# Patient Record
Sex: Female | Born: 1945 | Race: White | Hispanic: No | Marital: Married | Smoking: Current every day smoker
Health system: Southern US, Community
[De-identification: ages and names within clinical notes are randomized; demographics above are authoritative.]

## PROBLEM LIST (undated history)

## (undated) DIAGNOSIS — M1990 Unspecified osteoarthritis, unspecified site: Secondary | ICD-10-CM

## (undated) DIAGNOSIS — Z803 Family history of malignant neoplasm of breast: Secondary | ICD-10-CM

## (undated) DIAGNOSIS — F329 Major depressive disorder, single episode, unspecified: Secondary | ICD-10-CM

## (undated) DIAGNOSIS — G2401 Drug induced subacute dyskinesia: Secondary | ICD-10-CM

## (undated) DIAGNOSIS — S12000A Unspecified displaced fracture of first cervical vertebra, initial encounter for closed fracture: Secondary | ICD-10-CM

## (undated) DIAGNOSIS — F32A Depression, unspecified: Secondary | ICD-10-CM

## (undated) DIAGNOSIS — F419 Anxiety disorder, unspecified: Secondary | ICD-10-CM

## (undated) DIAGNOSIS — Z8042 Family history of malignant neoplasm of prostate: Secondary | ICD-10-CM

## (undated) DIAGNOSIS — K219 Gastro-esophageal reflux disease without esophagitis: Secondary | ICD-10-CM

## (undated) DIAGNOSIS — T7840XA Allergy, unspecified, initial encounter: Secondary | ICD-10-CM

## (undated) DIAGNOSIS — F1010 Alcohol abuse, uncomplicated: Secondary | ICD-10-CM

## (undated) DIAGNOSIS — D649 Anemia, unspecified: Secondary | ICD-10-CM

## (undated) DIAGNOSIS — C50919 Malignant neoplasm of unspecified site of unspecified female breast: Secondary | ICD-10-CM

## (undated) DIAGNOSIS — C801 Malignant (primary) neoplasm, unspecified: Secondary | ICD-10-CM

## (undated) DIAGNOSIS — I10 Essential (primary) hypertension: Secondary | ICD-10-CM

## (undated) HISTORY — PX: LAPAROSCOPY: SHX197

## (undated) HISTORY — PX: ROTATOR CUFF REPAIR: SHX139

## (undated) HISTORY — PX: ABDOMINAL HYSTERECTOMY: SHX81

## (undated) HISTORY — PX: OTHER SURGICAL HISTORY: SHX169

## (undated) HISTORY — PX: KNEE SURGERY: SHX244

## (undated) HISTORY — PX: INCONTINENCE SURGERY: SHX676

## (undated) HISTORY — DX: Family history of malignant neoplasm of breast: Z80.3

## (undated) HISTORY — DX: Essential (primary) hypertension: I10

## (undated) HISTORY — DX: Malignant (primary) neoplasm, unspecified: C80.1

## (undated) HISTORY — DX: Allergy, unspecified, initial encounter: T78.40XA

## (undated) HISTORY — PX: AUGMENTATION MAMMAPLASTY: SUR837

## (undated) HISTORY — DX: Anxiety disorder, unspecified: F41.9

## (undated) HISTORY — DX: Gastro-esophageal reflux disease without esophagitis: K21.9

## (undated) HISTORY — DX: Major depressive disorder, single episode, unspecified: F32.9

## (undated) HISTORY — DX: Unspecified osteoarthritis, unspecified site: M19.90

## (undated) HISTORY — PX: CHOLECYSTECTOMY: SHX55

## (undated) HISTORY — DX: Family history of malignant neoplasm of prostate: Z80.42

## (undated) HISTORY — DX: Depression, unspecified: F32.A

---

## 1997-02-21 DIAGNOSIS — S12000A Unspecified displaced fracture of first cervical vertebra, initial encounter for closed fracture: Secondary | ICD-10-CM

## 1997-02-21 HISTORY — DX: Unspecified displaced fracture of first cervical vertebra, initial encounter for closed fracture: S12.000A

## 1997-11-05 ENCOUNTER — Emergency Department (HOSPITAL_COMMUNITY): Admission: EM | Admit: 1997-11-05 | Discharge: 1997-11-05 | Payer: Self-pay

## 1998-05-13 ENCOUNTER — Encounter: Payer: Self-pay | Admitting: Orthopaedic Surgery

## 1998-05-15 ENCOUNTER — Inpatient Hospital Stay (HOSPITAL_COMMUNITY): Admission: RE | Admit: 1998-05-15 | Discharge: 1998-05-16 | Payer: Self-pay | Admitting: Orthopaedic Surgery

## 1999-02-22 ENCOUNTER — Other Ambulatory Visit: Admission: RE | Admit: 1999-02-22 | Discharge: 1999-02-22 | Payer: Self-pay | Admitting: Obstetrics & Gynecology

## 2000-02-29 ENCOUNTER — Other Ambulatory Visit: Admission: RE | Admit: 2000-02-29 | Discharge: 2000-02-29 | Payer: Self-pay | Admitting: Obstetrics & Gynecology

## 2000-09-27 ENCOUNTER — Encounter: Payer: Self-pay | Admitting: Family Medicine

## 2000-09-27 ENCOUNTER — Encounter: Admission: RE | Admit: 2000-09-27 | Discharge: 2000-09-27 | Payer: Self-pay | Admitting: Family Medicine

## 2001-03-20 ENCOUNTER — Other Ambulatory Visit: Admission: RE | Admit: 2001-03-20 | Discharge: 2001-03-20 | Payer: Self-pay | Admitting: Obstetrics & Gynecology

## 2001-07-31 ENCOUNTER — Encounter: Admission: RE | Admit: 2001-07-31 | Discharge: 2001-07-31 | Payer: Self-pay | Admitting: Neurosurgery

## 2001-07-31 ENCOUNTER — Encounter: Payer: Self-pay | Admitting: Neurosurgery

## 2002-05-09 ENCOUNTER — Other Ambulatory Visit: Admission: RE | Admit: 2002-05-09 | Discharge: 2002-05-09 | Payer: Self-pay | Admitting: Obstetrics & Gynecology

## 2003-09-10 ENCOUNTER — Other Ambulatory Visit: Admission: RE | Admit: 2003-09-10 | Discharge: 2003-09-10 | Payer: Self-pay | Admitting: Obstetrics & Gynecology

## 2004-10-18 ENCOUNTER — Other Ambulatory Visit: Admission: RE | Admit: 2004-10-18 | Discharge: 2004-10-18 | Payer: Self-pay | Admitting: Obstetrics & Gynecology

## 2006-08-17 ENCOUNTER — Encounter: Admission: RE | Admit: 2006-08-17 | Discharge: 2006-08-17 | Payer: Self-pay | Admitting: Family Medicine

## 2006-12-31 ENCOUNTER — Emergency Department (HOSPITAL_COMMUNITY): Admission: EM | Admit: 2006-12-31 | Discharge: 2006-12-31 | Payer: Self-pay | Admitting: Emergency Medicine

## 2007-01-24 ENCOUNTER — Encounter: Admission: RE | Admit: 2007-01-24 | Discharge: 2007-01-24 | Payer: Self-pay | Admitting: Family Medicine

## 2007-09-11 ENCOUNTER — Encounter: Admission: RE | Admit: 2007-09-11 | Discharge: 2007-09-11 | Payer: Self-pay | Admitting: Family Medicine

## 2008-03-25 ENCOUNTER — Ambulatory Visit (HOSPITAL_BASED_OUTPATIENT_CLINIC_OR_DEPARTMENT_OTHER): Admission: RE | Admit: 2008-03-25 | Discharge: 2008-03-25 | Payer: Self-pay | Admitting: Urology

## 2008-11-28 ENCOUNTER — Encounter: Admission: RE | Admit: 2008-11-28 | Discharge: 2008-11-28 | Payer: Self-pay | Admitting: Family Medicine

## 2010-02-17 ENCOUNTER — Encounter
Admission: RE | Admit: 2010-02-17 | Discharge: 2010-02-17 | Payer: Self-pay | Source: Home / Self Care | Attending: Obstetrics & Gynecology | Admitting: Obstetrics & Gynecology

## 2010-06-08 LAB — CBC
Hemoglobin: 12.9 g/dL (ref 12.0–15.0)
Platelets: 349 10*3/uL (ref 150–400)
RDW: 13.6 % (ref 11.5–15.5)
WBC: 6 10*3/uL (ref 4.0–10.5)

## 2010-06-08 LAB — BASIC METABOLIC PANEL
Chloride: 96 mEq/L (ref 96–112)
GFR calc Af Amer: >60 mL/min (ref >60)
GFR calc non Af Amer: >60 mL/min (ref >60)
Potassium: 4.1 mEq/L (ref 3.5–5.1)
Sodium: 129 mEq/L — ABNORMAL LOW (ref 135–145)

## 2010-06-08 LAB — TYPE AND SCREEN

## 2010-07-06 NOTE — Op Note (Signed)
NAMECLOTINE, Mindy Young                 ACCOUNT NO.:  [id number]   MEDICAL RECORD NO.:  [id number]          PATIENT TYPE:  AMB   LOCATION:  [facility]                         FACILITY:  [facility]   PHYSICIAN:  Martina Sinner, MD DATE OF BIRTH:  1945/08/29   DATE OF PROCEDURE:  DATE OF DISCHARGE:                               OPERATIVE REPORT   PREOPERATIVE DIAGNOSIS:  Stress incontinence.   POSTOPERATIVE DIAGNOSIS:  Stress incontinence.   SURGERY:  Sling [facility]),  cystourethropexy plus cystoscopy.   SURGEON:  Scott A. MacDiarmid, MD.   Mindy Young has stress incontinence.  She was prepped and draped in usual  fashion.  Her preoperative lab tests were normal.  Preoperative  antibiotics were given.  Extra care was taken with leg positioning to  minimize the risks   Two 1 cm incisions were made one fingerbreadth above the symphysis pubis  and 1.5 cm lateral to the midline.  I marked a 2 cm incision underlying  the mid urethra.  I instilled 7 mL of lidocaine and epinephrine mixture.  I made an appropriately deep incision and dissected sharply to  urethrovesical angle bilaterally.   With the bladder empty I passed the [facility] needle on top of and along the  back of the symphysis pubis keeping lateral and then turning into the  pulp with my index finger bilaterally.  With the bladder empty I  attached the sling and brought it up through the retropubic space.  Before attaching the sling I cystoscoped the patient.  There was no  indentation on the bladder with movement of the trocar.  There was  excellent fluid jets  bilaterally.  There is no injury to the bladder or  urethra.   I tensioned over the fat part with moderate-sized Kelly clamp.  I cut  below the blue dots, irrigated the sheath and removed the sheaths.  I  was very happy with the position of the sling and its hypermobility and  lack of spring back effect.   I closed the anterior vaginal wall with a running 2-0 Vicryl, followed  by  two interrupted sutures.  I cut the sling below the skin in the  suprapubic area, used a 4-0 Vicryl suture and then Dermabond.   Under anesthesia, Mindy Young grade 2 cystocele was a little bit larger  and hopefully will not be symptomatic as she ages.  I am hoping the  sling reaches her treatment goal.  I did keep the sling away from the  proximal urethra to minimize any potential future hinge effect.           ______________________________  Martina Sinner, MD  Electronically Signed    SAM/MEDQ  D:  03/25/2008  T:  03/26/2008  Job:  [id number]

## 2010-07-27 ENCOUNTER — Ambulatory Visit
Admission: RE | Admit: 2010-07-27 | Discharge: 2010-07-27 | Disposition: A | Discharge status: Home / Self Care | Payer: BC Managed Care – PPO | Source: Ambulatory Visit | Attending: Family Medicine | Admitting: Family Medicine

## 2010-07-27 ENCOUNTER — Other Ambulatory Visit: Payer: Self-pay | Admitting: Family Medicine

## 2010-07-27 MED ORDER — IOHEXOL 300 MG/ML  SOLN
100.0000 mL | Freq: Once | INTRAMUSCULAR | Status: AC | PRN
  Administered 2010-07-27: 100 mL via INTRAVENOUS

## 2010-08-23 ENCOUNTER — Encounter: Payer: Self-pay | Admitting: Internal Medicine

## 2010-09-07 ENCOUNTER — Ambulatory Visit (AMBULATORY_SURGERY_CENTER): Payer: BC Managed Care – PPO | Admitting: *Deleted

## 2010-09-07 DIAGNOSIS — Z1211 Encounter for screening for malignant neoplasm of colon: Secondary | ICD-10-CM

## 2010-09-07 DIAGNOSIS — K5732 Diverticulitis of large intestine without perforation or abscess without bleeding: Secondary | ICD-10-CM

## 2010-09-07 DIAGNOSIS — K5792 Diverticulitis of intestine, part unspecified, without perforation or abscess without bleeding: Secondary | ICD-10-CM

## 2010-09-07 MED ORDER — PEG-KCL-NACL-NASULF-NA ASC-C 100 G PO SOLR: 1.0000 | Freq: Once | ORAL | Status: DC

## 2010-09-17 ENCOUNTER — Telehealth: Payer: Self-pay | Admitting: *Deleted

## 2010-09-17 NOTE — Telephone Encounter (Signed)
Patient is concerned about taking her prep and keeping it down at the early AM time. Discussed with patient and offered to move her appointment to 11:30 AM but patient decided to keep her appointment at the scheduled time.

## 2010-09-21 ENCOUNTER — Encounter: Payer: Self-pay | Admitting: Internal Medicine

## 2010-09-21 ENCOUNTER — Ambulatory Visit (AMBULATORY_SURGERY_CENTER): Payer: BC Managed Care – PPO | Admitting: Internal Medicine

## 2010-09-21 DIAGNOSIS — D126 Benign neoplasm of colon, unspecified: Secondary | ICD-10-CM

## 2010-09-21 DIAGNOSIS — K5732 Diverticulitis of large intestine without perforation or abscess without bleeding: Secondary | ICD-10-CM

## 2010-09-21 DIAGNOSIS — Z1211 Encounter for screening for malignant neoplasm of colon: Secondary | ICD-10-CM

## 2010-09-21 MED ORDER — SODIUM CHLORIDE 0.9 % IV SOLN: 500.0000 mL | INTRAVENOUS | Status: DC

## 2010-09-21 NOTE — Patient Instructions (Signed)
Discharge instructions given with verbal understanding. Handout on polyps given. Resume previous medications. 

## 2010-09-22 ENCOUNTER — Telehealth: Payer: Self-pay

## 2010-09-22 NOTE — Telephone Encounter (Signed)

## 2010-09-27 ENCOUNTER — Encounter: Payer: Self-pay | Admitting: Internal Medicine

## 2010-12-10 ENCOUNTER — Emergency Department (HOSPITAL_BASED_OUTPATIENT_CLINIC_OR_DEPARTMENT_OTHER)
Admission: EM | Admit: 2010-12-10 | Discharge: 2010-12-10 | Disposition: A | Discharge status: Home / Self Care | Payer: BC Managed Care – PPO | Attending: Emergency Medicine | Admitting: Emergency Medicine

## 2010-12-10 ENCOUNTER — Emergency Department (HOSPITAL_BASED_OUTPATIENT_CLINIC_OR_DEPARTMENT_OTHER): Payer: BC Managed Care – PPO

## 2010-12-10 ENCOUNTER — Encounter (HOSPITAL_BASED_OUTPATIENT_CLINIC_OR_DEPARTMENT_OTHER): Payer: Self-pay

## 2010-12-10 ENCOUNTER — Emergency Department (INDEPENDENT_AMBULATORY_CARE_PROVIDER_SITE_OTHER): Payer: BC Managed Care – PPO

## 2010-12-10 DIAGNOSIS — IMO0002 Reserved for concepts with insufficient information to code with codable children: Secondary | ICD-10-CM

## 2010-12-10 DIAGNOSIS — S51009A Unspecified open wound of unspecified elbow, initial encounter: Secondary | ICD-10-CM

## 2010-12-10 DIAGNOSIS — I10 Essential (primary) hypertension: Secondary | ICD-10-CM | POA: Insufficient documentation

## 2010-12-10 DIAGNOSIS — W010XXA Fall on same level from slipping, tripping and stumbling without subsequent striking against object, initial encounter: Secondary | ICD-10-CM | POA: Insufficient documentation

## 2010-12-10 DIAGNOSIS — W19XXXA Unspecified fall, initial encounter: Secondary | ICD-10-CM

## 2010-12-10 DIAGNOSIS — F329 Major depressive disorder, single episode, unspecified: Secondary | ICD-10-CM | POA: Insufficient documentation

## 2010-12-10 DIAGNOSIS — F17200 Nicotine dependence, unspecified, uncomplicated: Secondary | ICD-10-CM | POA: Insufficient documentation

## 2010-12-10 DIAGNOSIS — Y92009 Unspecified place in unspecified non-institutional (private) residence as the place of occurrence of the external cause: Secondary | ICD-10-CM | POA: Insufficient documentation

## 2010-12-10 DIAGNOSIS — F3289 Other specified depressive episodes: Secondary | ICD-10-CM | POA: Insufficient documentation

## 2010-12-10 DIAGNOSIS — K219 Gastro-esophageal reflux disease without esophagitis: Secondary | ICD-10-CM | POA: Insufficient documentation

## 2010-12-10 MED ORDER — LIDOCAINE HCL (PF) 1 % IJ SOLN
5.0000 mL | Freq: Once | INTRAMUSCULAR | Status: AC
  Administered 2010-12-10: 5 mL
  Filled 2010-12-10: qty 5

## 2010-12-10 MED ORDER — TETANUS-DIPHTH-ACELL PERTUSSIS 5-2.5-18.5 LF-MCG/0.5 IM SUSP
0.5000 mL | Freq: Once | INTRAMUSCULAR | Status: AC
  Administered 2010-12-10: 0.5 mL via INTRAMUSCULAR
  Filled 2010-12-10: qty 0.5

## 2010-12-10 NOTE — ED Provider Notes (Signed)
History     CSN: [id number] Arrival date & time: 12/10/2010  7:23 PM   First MD Initiated Contact with Patient 12/10/10 1958      Chief Complaint  Patient presents with  . Arm Injury    (Consider location/radiation/quality/duration/timing/severity/associated sxs/prior treatment) Patient is a 65 y.o. female presenting with arm injury. The history is provided by the patient.  Arm Injury  The incident occurred just prior to arrival. The incident occurred at home. The injury mechanism was a fall (tripped over dog, landed on left elbow). She came to the ER via personal transport. There is an injury to the left elbow and left forearm. The pain is mild. It is unknown if a foreign body is present. Associated symptoms include nausea. Pertinent negatives include no chest pain, no numbness, no abdominal pain, no vomiting, no headaches, no neck pain, no pain when bearing weight, no loss of consciousness, no weakness, no cough and no difficulty breathing. There have been no prior injuries to these areas. Her tetanus status is unknown. She has been behaving normally.    Past Medical History  Diagnosis Date  . Allergy   . Anxiety   . Arthritis   . Cancer     melanoma  . Depression   . GERD (gastroesophageal reflux disease)   . Hypertension     Past Surgical History  Procedure Date  . Cholecystectomy   . Collarbone   . Rotator cuff repair     left  . Knee surgery     removal of cyst, repair of cartiledge  . Incontinence surgery   . Laparoscopy     for endometriosis    Family History  Problem Relation Age of Onset  . Colon cancer Neg Hx     History  Substance Use Topics  . Smoking status: Current Everyday Smoker -- 2.0 packs/day  . Smokeless tobacco: Not on file  . Alcohol Use: 2.0 oz/week    4 drink(s) per week    OB History    Grav Para Term Preterm Abortions TAB SAB Ect Mult Living                  Review of Systems  Constitutional: Negative for fever, chills,  diaphoresis and fatigue.  HENT: Negative for congestion, rhinorrhea, sneezing and neck pain.   Eyes: Negative.   Respiratory: Negative for cough, chest tightness and shortness of breath.   Cardiovascular: Negative for chest pain and leg swelling.  Gastrointestinal: Positive for nausea. Negative for vomiting, abdominal pain, diarrhea and blood in stool.  Genitourinary: Negative for frequency, hematuria, flank pain and difficulty urinating.  Musculoskeletal: Negative for back pain and arthralgias.  Skin: Negative for rash.  Neurological: Negative for dizziness, loss of consciousness, speech difficulty, weakness, numbness and headaches.    Allergies  Review of patient's allergies indicates no known allergies.  Home Medications   Current Outpatient Rx  Name Route Sig Dispense Refill  . ALPRAZOLAM 0.5 MG PO TABS Oral Take 0.5 mg by mouth 2 (two) times daily as needed. For anxiety    . BUPROPION HCL ER (XL) 300 MG PO TB24  1 tablet Daily.    Marland Kitchen BYSTOLIC 20 MG PO TABS  1 tablet Daily.    . CELEBREX 200 MG PO CAPS  1 tablet Daily.    Marland Kitchen CIMETIDINE 400 MG PO TABS  1 tablet Daily.    Marland Kitchen LOSARTAN POTASSIUM 50 MG PO TABS  1 tablet Daily.    . MULTIVITAMIN PO Oral Take  1 tablet by mouth daily.      Marland Kitchen OVER THE COUNTER MEDICATION Oral Take 1 capsule by mouth daily. MegaRed     . PREMPRO 0.625-2.5 MG PO TABS  1 tablet Daily.      BP 141/93  Pulse 75  Temp(Src) 97.7 F (36.5 C) (Oral)  Resp 16  Ht 5\' 6"  (1.676 m)  Wt 150 lb (68.04 kg)  BMI 24.21 kg/m2  SpO2 94%  Physical Exam  Constitutional: She is oriented to person, place, and time. She appears well-developed and well-nourished.  HENT:  Head: Normocephalic and atraumatic.  Eyes: Pupils are equal, round, and reactive to light.  Neck: Normal range of motion. Neck supple.  Cardiovascular: Normal rate, regular rhythm and normal heart sounds.   Pulmonary/Chest: Effort normal and breath sounds normal. No respiratory distress. She has no  wheezes. She has no rales. She exhibits no tenderness.  Abdominal: Soft. Bowel sounds are normal. There is no tenderness. There is no rebound and no guarding.  Musculoskeletal: Normal range of motion. She exhibits no edema.       4cm laceration over olecranon with mild bony tenderness, 5cm very superficial laceration over mid forearm, and multiple abrasions to hand/arm.  No neck/back pain.  No other bony tenderness  Lymphadenopathy:    She has no cervical adenopathy.  Neurological: She is alert and oriented to person, place, and time.  Skin: Skin is warm and dry. No rash noted.  Psychiatric: She has a normal mood and affect.    ED Course  LACERATION REPAIR Date/Time: 12/10/2010 9:20 PM Performed by: Cami Delawder Authorized by: Rolan Bucco Consent: Verbal consent obtained. Body area: upper extremity Location details: left elbow Laceration length: 4 cm Foreign bodies: no foreign bodies Tendon involvement: none Nerve involvement: none Vascular damage: no Anesthesia: local infiltration Local anesthetic: lidocaine 1% without epinephrine Anesthetic total: 2 ml Preparation: Patient was prepped and draped in the usual sterile fashion. Irrigation solution: saline Irrigation method: tap Amount of cleaning: standard Debridement: none Skin closure: 4-0 Prolene Number of sutures: 5 Technique: simple Approximation: close Approximation difficulty: simple Dressing: antibiotic ointment and 4x4 sterile gauze Patient tolerance: Patient tolerated the procedure well with no immediate complications.  steri strips placed over superficial laceration  Labs Reviewed - No data to display Dg Elbow Complete Left  12/10/2010  *RADIOLOGY REPORT*  Clinical Data: Laceration  LEFT ELBOW - COMPLETE 3+ VIEW  Comparison: None.  Findings: There is soft tissue defect posterior to the ulna.  No radiodense foreign body.  No evidence of fracture.  IMPRESSION: No fracture or foreign body.  Original Report  Authenticated By: Genevive Bi, M.D.     1. Laceration       MDM          Rolan Bucco, MD 12/10/10 2123

## 2010-12-10 NOTE — ED Notes (Signed)
Tripped/fell-injury to left arm-lacerations to left elbow and forearm

## 2010-12-10 NOTE — ED Notes (Signed)
Pt initally refused xray and is now agreeable to having it done

## 2011-06-25 ENCOUNTER — Inpatient Hospital Stay (HOSPITAL_COMMUNITY)
Admission: EM | Admit: 2011-06-25 | Discharge: 2011-07-03 | DRG: 645 | Disposition: A | Discharge status: Home / Self Care | Payer: Managed Care, Other (non HMO) | Attending: Internal Medicine | Admitting: Internal Medicine

## 2011-06-25 ENCOUNTER — Encounter (HOSPITAL_COMMUNITY): Payer: Self-pay | Admitting: Emergency Medicine

## 2011-06-25 ENCOUNTER — Emergency Department (HOSPITAL_COMMUNITY): Payer: Managed Care, Other (non HMO)

## 2011-06-25 DIAGNOSIS — K219 Gastro-esophageal reflux disease without esophagitis: Secondary | ICD-10-CM | POA: Diagnosis present

## 2011-06-25 DIAGNOSIS — E871 Hypo-osmolality and hyponatremia: Secondary | ICD-10-CM | POA: Diagnosis present

## 2011-06-25 DIAGNOSIS — F3289 Other specified depressive episodes: Secondary | ICD-10-CM | POA: Diagnosis present

## 2011-06-25 DIAGNOSIS — R05 Cough: Secondary | ICD-10-CM | POA: Diagnosis present

## 2011-06-25 DIAGNOSIS — Z9181 History of falling: Secondary | ICD-10-CM

## 2011-06-25 DIAGNOSIS — E236 Other disorders of pituitary gland: Principal | ICD-10-CM | POA: Diagnosis present

## 2011-06-25 DIAGNOSIS — E860 Dehydration: Secondary | ICD-10-CM

## 2011-06-25 DIAGNOSIS — F1010 Alcohol abuse, uncomplicated: Secondary | ICD-10-CM

## 2011-06-25 DIAGNOSIS — F17210 Nicotine dependence, cigarettes, uncomplicated: Secondary | ICD-10-CM | POA: Diagnosis present

## 2011-06-25 DIAGNOSIS — T46905A Adverse effect of unspecified agents primarily affecting the cardiovascular system, initial encounter: Secondary | ICD-10-CM | POA: Diagnosis present

## 2011-06-25 DIAGNOSIS — M129 Arthropathy, unspecified: Secondary | ICD-10-CM | POA: Diagnosis present

## 2011-06-25 DIAGNOSIS — R059 Cough, unspecified: Secondary | ICD-10-CM | POA: Diagnosis present

## 2011-06-25 DIAGNOSIS — Z79899 Other long term (current) drug therapy: Secondary | ICD-10-CM

## 2011-06-25 DIAGNOSIS — F329 Major depressive disorder, single episode, unspecified: Secondary | ICD-10-CM | POA: Diagnosis present

## 2011-06-25 DIAGNOSIS — R269 Unspecified abnormalities of gait and mobility: Secondary | ICD-10-CM | POA: Diagnosis present

## 2011-06-25 DIAGNOSIS — R7402 Elevation of levels of lactic acid dehydrogenase (LDH): Secondary | ICD-10-CM | POA: Diagnosis present

## 2011-06-25 DIAGNOSIS — T43505A Adverse effect of unspecified antipsychotics and neuroleptics, initial encounter: Secondary | ICD-10-CM | POA: Diagnosis present

## 2011-06-25 DIAGNOSIS — M79609 Pain in unspecified limb: Secondary | ICD-10-CM | POA: Diagnosis present

## 2011-06-25 DIAGNOSIS — R51 Headache: Secondary | ICD-10-CM | POA: Diagnosis not present

## 2011-06-25 DIAGNOSIS — Z85820 Personal history of malignant melanoma of skin: Secondary | ICD-10-CM

## 2011-06-25 DIAGNOSIS — F10939 Alcohol use, unspecified with withdrawal, unspecified: Secondary | ICD-10-CM

## 2011-06-25 DIAGNOSIS — I10 Essential (primary) hypertension: Secondary | ICD-10-CM | POA: Diagnosis present

## 2011-06-25 DIAGNOSIS — R7401 Elevation of levels of liver transaminase levels: Secondary | ICD-10-CM | POA: Diagnosis present

## 2011-06-25 DIAGNOSIS — F17200 Nicotine dependence, unspecified, uncomplicated: Secondary | ICD-10-CM | POA: Diagnosis present

## 2011-06-25 DIAGNOSIS — D649 Anemia, unspecified: Secondary | ICD-10-CM | POA: Diagnosis present

## 2011-06-25 DIAGNOSIS — R259 Unspecified abnormal involuntary movements: Secondary | ICD-10-CM | POA: Diagnosis present

## 2011-06-25 DIAGNOSIS — F411 Generalized anxiety disorder: Secondary | ICD-10-CM | POA: Diagnosis present

## 2011-06-25 DIAGNOSIS — F10239 Alcohol dependence with withdrawal, unspecified: Secondary | ICD-10-CM

## 2011-06-25 HISTORY — DX: Anemia, unspecified: D64.9

## 2011-06-25 LAB — COMPREHENSIVE METABOLIC PANEL
AST: 76 U/L — ABNORMAL HIGH (ref 0–37)
Albumin: 3.7 g/dL (ref 3.5–5.2)
Alkaline Phosphatase: 57 U/L (ref 39–117)
BUN: 5 mg/dL — ABNORMAL LOW (ref 6–23)
CO2: 24 mEq/L (ref 19–32)
Chloride: 78 mEq/L — ABNORMAL LOW (ref 96–112)
GFR calc non Af Amer: >90 mL/min (ref >90)
Potassium: 3.5 mEq/L (ref 3.5–5.1)
Total Bilirubin: 0.7 mg/dL (ref 0.3–1.2)

## 2011-06-25 LAB — CBC
HCT: 30.2 % — ABNORMAL LOW (ref 36.0–46.0)
Hemoglobin: 11.2 g/dL — ABNORMAL LOW (ref 12.0–15.0)
MCV: 95.9 fL (ref 78.0–100.0)
RBC: 3.15 MIL/uL — ABNORMAL LOW (ref 3.87–5.11)
RDW: 12.5 % (ref 11.5–15.5)
WBC: 5.9 10*3/uL (ref 4.0–10.5)

## 2011-06-25 LAB — URINALYSIS, ROUTINE W REFLEX MICROSCOPIC
Glucose, UA: NEGATIVE mg/dL
Leukocytes, UA: NEGATIVE
Protein, ur: NEGATIVE mg/dL
Specific Gravity, Urine: 1.012 (ref 1.005–1.030)
pH: 8 (ref 5.0–8.0)

## 2011-06-25 LAB — DIFFERENTIAL
Basophils Absolute: 0 10*3/uL (ref 0.0–0.1)
Eosinophils Relative: 0 % (ref 0–5)
Lymphocytes Relative: 11 % — ABNORMAL LOW (ref 12–46)
Lymphs Abs: 0.6 10*3/uL — ABNORMAL LOW (ref 0.7–4.0)
Monocytes Relative: 15 % — ABNORMAL HIGH (ref 3–12)
Neutro Abs: 4.4 10*3/uL (ref 1.7–7.7)

## 2011-06-25 LAB — URINE MICROSCOPIC-ADD ON

## 2011-06-25 LAB — AMMONIA: Ammonia: 19 umol/L (ref 11–60)

## 2011-06-25 LAB — PROTIME-INR: Prothrombin Time: 13.5 seconds (ref 11.6–15.2)

## 2011-06-25 LAB — ETHANOL: Alcohol, Ethyl (B): <11 mg/dL (ref 0–11)

## 2011-06-25 MED ORDER — LORAZEPAM 2 MG/ML IJ SOLN
1.0000 mg | Freq: Once | INTRAMUSCULAR | Status: AC
  Administered 2011-06-25: 1 mg via INTRAVENOUS

## 2011-06-25 MED ORDER — ONDANSETRON HCL 4 MG/2ML IJ SOLN
4.0000 mg | Freq: Once | INTRAMUSCULAR | Status: AC
  Administered 2011-06-25: 4 mg via INTRAVENOUS
  Filled 2011-06-25: qty 2

## 2011-06-25 MED ORDER — VITAMIN B-1 100 MG PO TABS
100.0000 mg | ORAL_TABLET | Freq: Every day | ORAL | Status: DC
  Administered 2011-06-25 – 2011-07-03 (×9): 100 mg via ORAL
  Filled 2011-06-25 (×9): qty 1

## 2011-06-25 MED ORDER — ADULT MULTIVITAMIN W/MINERALS CH
1.0000 | ORAL_TABLET | Freq: Every day | ORAL | Status: DC
  Administered 2011-06-25 – 2011-07-03 (×9): 1 via ORAL
  Filled 2011-06-25 (×9): qty 1

## 2011-06-25 MED ORDER — LORAZEPAM 2 MG/ML IJ SOLN
2.0000 mg | Freq: Once | INTRAMUSCULAR | Status: AC
  Administered 2011-06-25: 2 mg via INTRAVENOUS

## 2011-06-25 MED ORDER — LORAZEPAM 2 MG/ML IJ SOLN
1.0000 mg | Freq: Four times a day (QID) | INTRAMUSCULAR | Status: AC | PRN
  Administered 2011-06-25 – 2011-06-27 (×5): 1 mg via INTRAVENOUS
  Administered 2011-06-28: 03:00:00 via INTRAVENOUS
  Filled 2011-06-25 (×12): qty 1

## 2011-06-25 MED ORDER — LORAZEPAM 2 MG/ML IJ SOLN
0.0000 mg | Freq: Two times a day (BID) | INTRAMUSCULAR | Status: AC
  Administered 2011-06-27 – 2011-06-29 (×4): 2 mg via INTRAVENOUS
  Filled 2011-06-25 (×3): qty 1

## 2011-06-25 MED ORDER — SODIUM CHLORIDE 0.9 % IV SOLN
Freq: Once | INTRAVENOUS | Status: AC
  Administered 2011-06-25: 19:00:00 via INTRAVENOUS

## 2011-06-25 MED ORDER — LORAZEPAM 1 MG PO TABS
1.0000 mg | ORAL_TABLET | Freq: Four times a day (QID) | ORAL | Status: AC | PRN
  Administered 2011-06-25: 1 mg via ORAL
  Filled 2011-06-25: qty 1

## 2011-06-25 MED ORDER — FOLIC ACID 1 MG PO TABS
1.0000 mg | ORAL_TABLET | Freq: Every day | ORAL | Status: DC
  Administered 2011-06-25 – 2011-07-03 (×9): 1 mg via ORAL
  Filled 2011-06-25 (×9): qty 1

## 2011-06-25 MED ORDER — THIAMINE HCL 100 MG/ML IJ SOLN
100.0000 mg | Freq: Every day | INTRAMUSCULAR | Status: DC
  Filled 2011-06-25: qty 2
  Filled 2011-06-25 (×5): qty 1

## 2011-06-25 MED ORDER — LORAZEPAM 2 MG/ML IJ SOLN
0.0000 mg | Freq: Four times a day (QID) | INTRAMUSCULAR | Status: AC
  Administered 2011-06-26: 1 mg via INTRAVENOUS
  Administered 2011-06-26: 2 mg via INTRAVENOUS
  Administered 2011-06-26 – 2011-06-27 (×5): 1 mg via INTRAVENOUS
  Filled 2011-06-25 (×6): qty 1

## 2011-06-25 MED ORDER — SODIUM CHLORIDE 0.9 % IV SOLN
Freq: Once | INTRAVENOUS | Status: AC
  Administered 2011-06-25: 21:00:00 via INTRAVENOUS

## 2011-06-25 MED ORDER — SODIUM CHLORIDE 0.9 % IV BOLUS (SEPSIS): 1000.0000 mL | Freq: Once | INTRAVENOUS | Status: DC

## 2011-06-25 NOTE — H&P (Signed)
History and Physical  Mindy Young:[id number] DOB: January 20, 1946 DOA: 06/25/2011  Referring provider: Grant Fontana, PA PCP: REDMON,NOELLE, PA, PA Lupita Raider, MD  Chief Complaint: Cough  HPI:  66 year old woman presented to the emergency department for complaint of two-week history of cough. This is her primary concern. She was recently seen by her primary care provider and losartan was discontinued as it was a possible etiology for her cough. She has several other complaints including a few episodes of post-tussive vomiting, some shortness of breath worse on exertion and poor solid intake. No fever, chills. She notes tremors in her legs and gait instability. She has a history of falls in the last several weeks. She has a history of drinking at least 3-4 liquor drinks per day.  In the emergency department she was noted be afebrile and vital signs were stable. Chemistry panel was notable for sodium of 114, chloride 78 BUN 5. Reticulocyte function panel notable for mild elevation of AST and ALT. CBC is essentially unremarkable. Urinalysis and alcohol level were negative. Chest x-ray is pending at this time.  She was given several doses of lorazepam and admission was requested for marked hyponatremia and potential for alcohol withdrawal.  Review of Systems:  Negative for fever, changes to her vision, sore throat, rash, chest pain, dysuria, bleeding. Otherwise as above.  Past Medical History  Diagnosis Date  . Allergy   . Anxiety   . Arthritis   . Cancer     melanoma  . Depression   . GERD (gastroesophageal reflux disease)   . Hypertension    Past Surgical History  Procedure Date  . Cholecystectomy   . Collarbone   . Rotator cuff repair     left  . Knee surgery     removal of cyst, repair of cartiledge  . Incontinence surgery   . Laparoscopy     for endometriosis   Social History:  reports that she has been smoking Cigarettes.  She has been smoking about 2 packs per day.  She does not have any smokeless tobacco history on file. She reports that she drinks about 10.5 ounces of alcohol per week. She reports that she does not use illicit drugs.  No Known Allergies  Family History  Problem Relation Age of Onset  . Colon cancer Neg Hx    Prior to Admission medications   Medication Sig Start Date End Date Taking? Authorizing Provider  ALPRAZolam Prudy Feeler) 0.5 MG tablet Take 0.5 mg by mouth 2 (two) times daily as needed. For anxiety 08/10/10  Yes Historical Provider, MD  buPROPion (WELLBUTRIN XL) 300 MG 24 hr tablet 1 tablet Daily. 09/06/10  Yes Historical Provider, MD  BYSTOLIC 20 MG TABS Take 1 tablet by mouth Daily.  09/06/10  Yes Historical Provider, MD  CELEBREX 200 MG capsule Take 200 mg by mouth Daily.  07/30/10  Yes Historical Provider, MD  cimetidine (TAGAMET) 400 MG tablet Take 400 mg by mouth Daily.  09/06/10  Yes Historical Provider, MD  escitalopram (LEXAPRO) 20 MG tablet Take 20 mg by mouth daily.   Yes Historical Provider, MD  Multiple Vitamin (MULTIVITAMIN PO) Take 1 tablet by mouth daily.     Yes Historical Provider, MD  PREMPRO 0.625-2.5 MG per tablet Take 1 tablet by mouth Daily.  08/04/10  Yes Historical Provider, MD   Physical Exam: Filed Vitals:   06/25/11 2030 06/25/11 2045 06/25/11 2100 06/25/11 2116  BP: 160/86 141/71 155/87 155/87  Pulse: 92 94 92 132  Temp:  TempSrc:      Resp:      SpO2: 93% 97% 98%     General:  Appears calm and comfortable. Examined in the emergency department. Nontoxic.  Eyes:  Pupils equal, round, react to light. Normal lids, irises, conjunctiva.  ENT:  Grossly normal hearing. Lips and tongue appear unremarkable.  Neck:  No lymphadenopathy or masses. No thyromegaly.  Cardiovascular:  Regular rate and rhythm. No murmur, rub, gallop. No significant lower extremity edema.  Respiratory:  Clear to auscultation bilaterally with the exception of a few wheezes. No rales noted. No definite rhonchi. Normal respiratory  effort.  Abdomen:  Soft, nontender, nondistended.  Skin:  Appears grossly unremarkable without lesions or induration.  Musculoskeletal:  Grossly normal tone and strength in the lower extremities bilaterally.  Psychiatric:  Grossly normal mood and affect. Speech fluent and appropriate. Alert and oriented to herself, location, month. Mistakenly stated the year as 1913 but recognized her error.  Labs on Admission:  Basic Metabolic Panel:  Lab 06/25/11 1610  NA 114*  K 3.5  CL 78*  CO2 24  GLUCOSE 122*  BUN 5*  CREATININE 0.63  CALCIUM 8.6  MG --  PHOS --   Liver Function Tests:  Lab 06/25/11 1945  AST 76*  ALT 38*  ALKPHOS 57  BILITOT 0.7  PROT 6.3  ALBUMIN 3.7    Lab 06/25/11 1945  LIPASE 48  AMYLASE --    Lab 06/25/11 1952  AMMONIA 19   CBC:  Lab 06/25/11 1945  WBC 5.9  NEUTROABS 4.4  HGB 11.2*  HCT 30.2*  MCV 95.9  PLT 265   Radiological Exams on Admission: No results found.chest x-ray pending.   Assessment/Plan 1. Severe hyponatremia: Most likely secondary to "beer drinker's potomania" based on history and laboratory studies. IV fluids. Serial basic metabolic panel. Differential would include occult cirrhosis, medication effect (Lexapro). Check serum and urine studies prior to IV fluids. Check TSH and a.m. cortisol. Continue Lexapro for now as I doubt this is causing her hyponatremia. 2. Dehydration: Plan as above. 3. Mild transaminitis: Most likely related to alcohol intake. Recommend close outpatient followup. Consideration to begin to outpatient hepatitis panel. 4. Alcohol abuse: Counseled on cessation. I discussed likelihood of this being the etiology for her acute issues. Social work Administrator, sports. 5. Cough: Losartan recently discontinued by primary care physician. No signs or symptoms to suggest infection. Check chest x-ray to complete evaluation. 6. Cigarette smoker: Recommend cessation.  Code Status:  Full code Family Communication:  Discussed  with daughter and daughter's husband at bedside with patient's permission. Disposition Plan:  Pending further evaluation and treatment. Anticipate home when improved.  Brendia Sacks, MD  [facility] Pager [phone] 06/25/2011, 9:32 PM

## 2011-06-25 NOTE — ED Notes (Signed)
Vomiting and having diarrhea for 3 days. Left swollen leg, knee, and foot. SOB and fell a lot on last 3 weeks. She's been drinking some vodka, but daughter doesn't think it's been associated with what's going on right now.

## 2011-06-25 NOTE — ED Provider Notes (Signed)
Medical screening examination/treatment/procedure(s) were conducted as a shared visit with non-physician practitioner(s) and myself.  I personally evaluated the patient during the encounter Patient with severe hyponatremia. She is to be admitted  Toy Baker, MD 06/25/11 2054

## 2011-06-25 NOTE — ED Notes (Signed)
Pt with cough that caused vomiting for 3 days, today this has been worse. Pt reports emesis with diarrhea today x5. "Shakey" for 3 wks with several falls. Pt reports 2-3 mixed vodka drinks per day for "years", last drink was last night. Pt has swelling in left lower extremity, reports fall with injury there 2 wks ago. Last fall was last weekend. Pt currently alert and oriented, somewhat slow speech, tremors. Respirations regular. Reports nausea.

## 2011-06-25 NOTE — ED Notes (Signed)
Pt presenting to ed with c/o sent from [facility] walk in-clinic for 3 days of vomiting, cough, decreased po intake, flapping tremors, increased falls, pedal edema, increased bruises, and h/o increased etoh. Pt sent to er for liver failure concern and dehydration. Pt states for the past few days she has been really sick with nausea and vomiting. Pt is alert and oriented at this time.

## 2011-06-25 NOTE — ED Provider Notes (Signed)
History     CSN: [id number]  Arrival date & time 06/25/11  1728   First MD Initiated Contact with Patient 06/25/11 1757      Chief Complaint  Patient presents with  . Emesis  . Fall    (Consider location/radiation/quality/duration/timing/severity/associated sxs/prior treatment) HPI Hx from pt and family member at bedside. 66 year old female with past medical history of hypertension and alcohol abuse with who presents with multiple complaints. She states that she typically has about 3 mixed drinks daily. Last drink was last evening. States she's been drinking this amount of alcohol for the past 4-5 years.  Patient reports about 2 weeks of cough. Cough has been dry in nature. States she does get short of breath as well, which is worse with exertion. Has not had any associated chest pain. Denies any congestion, sore throat. Occasionally gets post tussive emesis with this which is nonbilious and nonbloody. Denies any PND, orthopnea.  Also states she has had about 2-3 days of nausea, vomiting, and decreased PO intake. No known sick contacts. Denies any associated abd pain. No fever, chills.  Finally, states she has had leg pain, "shaking," and swelling x past ~5 days, which seems to be slightly worse in the L leg. States she wakes up with pain to her bilateral knees; she has tried massage which has been minimally helpful. Denies calf pain, tenderness. States she has had increased falls over the past several weeks; feels as if "my legs are going to give out." Denies any associated dizziness with this.  She was seen at the [facility] walk in clinic just prior to arrival, and was advised to come to ED for possible eval of liver failure vs electrolyte abnormality.  Past Medical History  Diagnosis Date  . Allergy   . Anxiety   . Arthritis   . Cancer     melanoma  . Depression   . GERD (gastroesophageal reflux disease)   . Hypertension     Past Surgical History  Procedure Date  .  Cholecystectomy   . Collarbone   . Rotator cuff repair     left  . Knee surgery     removal of cyst, repair of cartiledge  . Incontinence surgery   . Laparoscopy     for endometriosis    Family History  Problem Relation Age of Onset  . Colon cancer Neg Hx     History  Substance Use Topics  . Smoking status: Current Everyday Smoker -- 2.0 packs/day    Types: Cigarettes  . Smokeless tobacco: Not on file  . Alcohol Use: 0.0 oz/week     2-3 drinks daily    OB History    Grav Para Term Preterm Abortions TAB SAB Ect Mult Living                  Review of Systems  Constitutional: Negative for fever, chills, activity change and appetite change.  HENT: Negative for congestion, sore throat, trouble swallowing, neck pain and neck stiffness.   Eyes: Negative for visual disturbance.  Respiratory: Positive for cough and shortness of breath. Negative for chest tightness, wheezing and stridor.   Cardiovascular: Positive for leg swelling. Negative for chest pain and palpitations.  Gastrointestinal: Positive for nausea and vomiting. Negative for abdominal pain, diarrhea, constipation, blood in stool, abdominal distention and anal bleeding.    Allergies  Review of patient's allergies indicates no known allergies.  Home Medications   Current Outpatient Rx  Name Route Sig Dispense Refill  .  ALPRAZOLAM 0.5 MG PO TABS Oral Take 0.5 mg by mouth 2 (two) times daily as needed. For anxiety    . BUPROPION HCL ER (XL) 300 MG PO TB24  1 tablet Daily.    Marland Kitchen BYSTOLIC 20 MG PO TABS Oral Take 1 tablet by mouth Daily.     . CELEBREX 200 MG PO CAPS Oral Take 200 mg by mouth Daily.     Marland Kitchen CIMETIDINE 400 MG PO TABS Oral Take 400 mg by mouth Daily.     Marland Kitchen ESCITALOPRAM OXALATE 20 MG PO TABS Oral Take 20 mg by mouth daily.    . MULTIVITAMIN PO Oral Take 1 tablet by mouth daily.      Marland Kitchen PREMPRO 0.625-2.5 MG PO TABS Oral Take 1 tablet by mouth Daily.       BP 147/81  Pulse 90  Temp(Src) 97.5 F (36.4 C)  (Oral)  Resp 20  SpO2 99%  Physical Exam  Nursing note and vitals reviewed. Constitutional: She is oriented to person, place, and time. She appears well-developed and well-nourished. No distress.  HENT:  Head: Normocephalic and atraumatic.  Eyes: Pupils are equal, round, and reactive to light.  Neck: Normal range of motion. Neck supple.  Cardiovascular: Normal rate, regular rhythm and normal heart sounds.   Pulmonary/Chest: Effort normal and breath sounds normal. She exhibits no tenderness.  Abdominal: Soft. Bowel sounds are normal. She exhibits distension. She exhibits no mass. There is no tenderness. There is no rebound and no guarding.       hepatomegaly  Musculoskeletal: Normal range of motion.       No "flap" noted with hands extended. Occasional tremors noted to b/l LEs. 1+ pitting edema to b/l LEs.  Neurological: She is alert and oriented to person, place, and time.  Skin: Skin is warm and dry. She is not diaphoretic.  Psychiatric: She has a normal mood and affect.    ED Course  Procedures (including critical care time)  Labs Reviewed  URINALYSIS, ROUTINE W REFLEX MICROSCOPIC - Abnormal; Notable for the following:    Hgb urine dipstick TRACE (*)    Ketones, ur 15 (*)    All other components within normal limits  CBC - Abnormal; Notable for the following:    RBC 3.15 (*)    Hemoglobin 11.2 (*)    HCT 30.2 (*)    MCH 35.6 (*)    MCHC 37.1 (*)    All other components within normal limits  DIFFERENTIAL - Abnormal; Notable for the following:    Lymphocytes Relative 11 (*)    Monocytes Relative 15 (*)    Lymphs Abs 0.6 (*)    All other components within normal limits  COMPREHENSIVE METABOLIC PANEL - Abnormal; Notable for the following:    Sodium 114 (*)    Chloride 78 (*)    Glucose, Bld 122 (*)    BUN 5 (*)    AST 76 (*)    ALT 38 (*)    All other components within normal limits  PROTIME-INR  APTT  LIPASE, BLOOD  AMMONIA  URINE MICROSCOPIC-ADD ON  ETHANOL    No results found. CXR report from [place] today in PACS (I personally reviewed the films): *RADIOLOGY REPORT*  Clinical Data: Cough  CHEST - 2 VIEW  Comparison: November 28, 2008  Findings: The cardiac silhouette, mediastinum, pulmonary vasculature are within normal limits. Both lungs are clear. There is no acute bony abnormality.  IMPRESSION:There is no evidence of acute cardiac or pulmonary process.  No diagnosis found. 1) hyponatremia 2) etoh withdrawal   MDM  Patient presents with multiple complaints. Known history of daily alcohol use; admits 3 mixed drinks/day for several years. Concern for etoh withdrawal; CIWA protocol was initiated on my initial assessment and she has required several doses of Ativan thus far due to elevated scores. Labs indicate critical hyponatremia to 114. Pt requires admission for this; pt and family agreeable to plan. Discussed with Dr. Irene Limbo at 9:39 PM who accepts patient for admission to telemetry.         Grant Fontana, [place] 06/25/11 2139

## 2011-06-26 ENCOUNTER — Inpatient Hospital Stay (HOSPITAL_COMMUNITY): Payer: Managed Care, Other (non HMO)

## 2011-06-26 DIAGNOSIS — M7989 Other specified soft tissue disorders: Secondary | ICD-10-CM

## 2011-06-26 DIAGNOSIS — E860 Dehydration: Secondary | ICD-10-CM

## 2011-06-26 DIAGNOSIS — I10 Essential (primary) hypertension: Secondary | ICD-10-CM | POA: Diagnosis present

## 2011-06-26 DIAGNOSIS — F1010 Alcohol abuse, uncomplicated: Secondary | ICD-10-CM

## 2011-06-26 DIAGNOSIS — E871 Hypo-osmolality and hyponatremia: Secondary | ICD-10-CM

## 2011-06-26 LAB — COMPREHENSIVE METABOLIC PANEL
ALT: 33 U/L (ref 0–35)
AST: 65 U/L — ABNORMAL HIGH (ref 0–37)
Albumin: 3.3 g/dL — ABNORMAL LOW (ref 3.5–5.2)
Alkaline Phosphatase: 48 U/L (ref 39–117)
BUN: 3 mg/dL — ABNORMAL LOW (ref 6–23)
CO2: 23 mEq/L (ref 19–32)
Calcium: 8.6 mg/dL (ref 8.4–10.5)
Chloride: 90 mEq/L — ABNORMAL LOW (ref 96–112)
Creatinine, Ser: 0.65 mg/dL (ref 0.50–1.10)
GFR calc Af Amer: >90 mL/min (ref >90)
GFR calc non Af Amer: >90 mL/min (ref >90)
Glucose, Bld: 87 mg/dL (ref 70–99)
Potassium: 3.5 mEq/L (ref 3.5–5.1)
Sodium: 124 mEq/L — ABNORMAL LOW (ref 135–145)
Total Bilirubin: 0.6 mg/dL (ref 0.3–1.2)
Total Protein: 5.8 g/dL — ABNORMAL LOW (ref 6.0–8.3)

## 2011-06-26 LAB — OSMOLALITY, URINE: Osmolality, Ur: 117 mOsm/kg — ABNORMAL LOW (ref 390–1090)

## 2011-06-26 LAB — BASIC METABOLIC PANEL
CO2: 23 mEq/L (ref 19–32)
Calcium: 8.4 mg/dL (ref 8.4–10.5)
Chloride: 85 mEq/L — ABNORMAL LOW (ref 96–112)
GFR calc Af Amer: >90 mL/min (ref >90)
Sodium: 120 mEq/L — ABNORMAL LOW (ref 135–145)

## 2011-06-26 LAB — SODIUM, URINE, RANDOM: Sodium, Ur: 44 mEq/L

## 2011-06-26 MED ORDER — ONDANSETRON HCL 4 MG/2ML IJ SOLN
4.0000 mg | Freq: Four times a day (QID) | INTRAMUSCULAR | Status: DC | PRN
  Administered 2011-06-26 – 2011-07-03 (×5): 4 mg via INTRAVENOUS
  Filled 2011-06-26 (×5): qty 2

## 2011-06-26 MED ORDER — ALUM & MAG HYDROXIDE-SIMETH 200-200-20 MG/5ML PO SUSP: 30.0000 mL | Freq: Four times a day (QID) | ORAL | Status: DC | PRN

## 2011-06-26 MED ORDER — NICOTINE 21 MG/24HR TD PT24
21.0000 mg | MEDICATED_PATCH | Freq: Every day | TRANSDERMAL | Status: DC
  Administered 2011-06-26 – 2011-06-27 (×2): 21 mg via TRANSDERMAL
  Filled 2011-06-26 (×8): qty 1

## 2011-06-26 MED ORDER — SODIUM CHLORIDE 0.9 % IV SOLN
INTRAVENOUS | Status: DC
  Administered 2011-06-26 – 2011-07-02 (×7): via INTRAVENOUS

## 2011-06-26 MED ORDER — MEDROXYPROGESTERONE ACETATE 2.5 MG PO TABS
2.5000 mg | ORAL_TABLET | Freq: Every day | ORAL | Status: DC
  Administered 2011-06-26 – 2011-07-03 (×8): 2.5 mg via ORAL
  Filled 2011-06-26 (×8): qty 1

## 2011-06-26 MED ORDER — SODIUM CHLORIDE 0.9 % IJ SOLN
3.0000 mL | Freq: Two times a day (BID) | INTRAMUSCULAR | Status: DC
  Administered 2011-06-26 – 2011-07-02 (×7): 3 mL via INTRAVENOUS

## 2011-06-26 MED ORDER — ESCITALOPRAM OXALATE 20 MG PO TABS
20.0000 mg | ORAL_TABLET | Freq: Every day | ORAL | Status: DC
  Administered 2011-06-26: 20 mg via ORAL
  Filled 2011-06-26 (×2): qty 1

## 2011-06-26 MED ORDER — ONDANSETRON HCL 4 MG PO TABS
4.0000 mg | ORAL_TABLET | Freq: Four times a day (QID) | ORAL | Status: DC | PRN
  Administered 2011-07-03: 4 mg via ORAL
  Filled 2011-06-26: qty 1

## 2011-06-26 MED ORDER — BUPROPION HCL ER (XL) 300 MG PO TB24
300.0000 mg | ORAL_TABLET | Freq: Every day | ORAL | Status: DC
  Administered 2011-06-26 – 2011-07-03 (×8): 300 mg via ORAL
  Filled 2011-06-26 (×8): qty 1

## 2011-06-26 MED ORDER — OXYCODONE HCL 5 MG PO TABS
5.0000 mg | ORAL_TABLET | ORAL | Status: DC | PRN
  Administered 2011-06-26 – 2011-06-30 (×5): 5 mg via ORAL
  Filled 2011-06-26 (×7): qty 1

## 2011-06-26 MED ORDER — NEBIVOLOL HCL 10 MG PO TABS
20.0000 mg | ORAL_TABLET | Freq: Every day | ORAL | Status: DC
  Administered 2011-06-26 – 2011-06-30 (×5): 20 mg via ORAL
  Filled 2011-06-26 (×6): qty 2

## 2011-06-26 MED ORDER — GUAIFENESIN-CODEINE 100-10 MG/5ML PO SOLN
5.0000 mL | Freq: Four times a day (QID) | ORAL | Status: DC | PRN
  Administered 2011-06-27: 5 mL via ORAL
  Filled 2011-06-26: qty 5

## 2011-06-26 MED ORDER — CONJ ESTROG-MEDROXYPROGEST ACE 0.625-2.5 MG PO TABS
1.0000 | ORAL_TABLET | Freq: Every day | ORAL | Status: DC
  Filled 2011-06-26: qty 1

## 2011-06-26 MED ORDER — MORPHINE SULFATE 2 MG/ML IJ SOLN: 1.0000 mg | INTRAMUSCULAR | Status: DC | PRN

## 2011-06-26 MED ORDER — FAMOTIDINE 20 MG PO TABS
20.0000 mg | ORAL_TABLET | Freq: Two times a day (BID) | ORAL | Status: DC
  Administered 2011-06-26 – 2011-07-03 (×14): 20 mg via ORAL
  Filled 2011-06-26 (×16): qty 1

## 2011-06-26 MED ORDER — ESTROGENS CONJUGATED 0.625 MG PO TABS
0.6250 mg | ORAL_TABLET | Freq: Every day | ORAL | Status: DC
  Administered 2011-06-26 – 2011-07-03 (×8): 0.625 mg via ORAL
  Filled 2011-06-26 (×8): qty 1

## 2011-06-26 NOTE — Progress Notes (Signed)
VASCULAR LAB PRELIMINARY  PRELIMINARY  PRELIMINARY  PRELIMINARY  Left lower extremity venous Doppler completed.    Preliminary report:  There is no DVT or SVT noted in the left lower extremity.  There is a small to moderate sized Baker's cyst noted in the left popliteal fossa.  Sherren Kerns [place], 06/26/2011, 4:58 PM

## 2011-06-26 NOTE — Progress Notes (Signed)
Subjective: Patient reports feeling weak but improved from yesterday.  Appetite has improved.  Patient complaining of left leg pain below the knee.  Objective: Vital signs in last 24 hours: Filed Vitals:   06/25/11 2230 06/26/11 0005 06/26/11 0530 06/26/11 0553  BP: 135/66 159/93 146/83 146/83  Pulse:  93 96 83  Temp:  98.6 F (37 C) 98.6 F (37 C)   TempSrc:  Oral Oral   Resp:  16 18   Height:  5\' 6"  (1.676 m)    Weight:  72.576 kg (160 lb)    SpO2:  94% 92%    Weight change:   Intake/Output Summary (Last 24 hours) at 06/26/11 1357 Last data filed at 06/26/11 0402  Gross per 24 hour  Intake      0 ml  Output    700 ml  Net   -700 ml    Physical Exam: General: Awake, Oriented, No acute distress, tremulous at times. HEENT: EOMI. Neck: Supple CV: S1 and S2 Lungs: Clear to ascultation bilaterally Abdomen: Soft, Nontender, Nondistended, +bowel sounds. Ext: Good pulses. Trace edema.  Lab Results:  [facility] 06/26/11 0601 06/26/11 0050  NA 124* 120*  K 3.5 3.2*  CL 90* 85*  CO2 23 23  GLUCOSE 87 101*  BUN 3* 4*  CREATININE 0.65 0.65  CALCIUM 8.6 8.4  MG -- --  PHOS -- --    Basename 06/26/11 0601 06/25/11 1945  AST 65* 76*  ALT 33 38*  ALKPHOS 48 57  BILITOT 0.6 0.7  PROT 5.8* 6.3  ALBUMIN 3.3* 3.7    Basename 06/25/11 1945  LIPASE 48  AMYLASE --    Basename 06/25/11 1945  WBC 5.9  NEUTROABS 4.4  HGB 11.2*  HCT 30.2*  MCV 95.9  PLT 265   No results found for this basename: CKTOTAL:3,CKMB:3,CKMBINDEX:3,TROPONINI:3 in the last 72 hours No components found with this basename: POCBNP:3 No results found for this basename: DDIMER:2 in the last 72 hours No results found for this basename: HGBA1C:2 in the last 72 hours No results found for this basename: CHOL:2,HDL:2,LDLCALC:2,TRIG:2,CHOLHDL:2,LDLDIRECT:2 in the last 72 hours  Basename 06/26/11 0050  TSH 3.392  T4TOTAL --  T3FREE --  THYROIDAB --   No results found for this basename:  VITAMINB12:2,FOLATE:2,FERRITIN:2,TIBC:2,IRON:2,RETICCTPCT:2 in the last 72 hours  Micro Results: No results found for this or any previous visit (from the past 240 hour(s)).  Studies/Results: Dg Chest 2 View  06/26/2011  *RADIOLOGY REPORT*  Clinical Data: Cough.  CHEST - 2 VIEW 06/26/2011:  Comparison: Two-view chest x-ray yesterday and 11/28/2008.  Findings: Cardiac silhouette upper normal in size to slightly enlarged, unchanged.  Thoracic aorta mildly atherosclerotic, unchanged.  Hilar and mediastinal contours otherwise unremarkable. Linear atelectasis in the right lower lobe.  Lungs otherwise clear. Pulmonary vascularity normal.  Emphysematous changes in the upper lobes.  Bronchovascular markings normal.  No pleural effusions. Degenerative changes involving the thoracic spine.  Bilateral breast prostheses.  IMPRESSION: Stable borderline mild cardiomegaly.  Mild linear atelectasis in the right lower lobe.  No acute cardiopulmonary disease otherwise. COPD/emphysema.  Original Report Authenticated By: Arnell Sieving, M.D.    Medications: I have reviewed the patient's current medications. Scheduled Meds:   . sodium chloride   Intravenous Once  . sodium chloride   Intravenous Once  . buPROPion  300 mg Oral Daily  . escitalopram  20 mg Oral Daily  . estrogens (conjugated)  0.625 mg Oral Daily  . famotidine  20 mg Oral BID  . folic  acid  1 mg Oral Daily  . LORazepam  0-4 mg Intravenous Q6H   Followed by  . LORazepam  0-4 mg Intravenous Q12H  . LORazepam  1 mg Intravenous Once  . LORazepam  2 mg Intravenous Once  . medroxyPROGESTERone  2.5 mg Oral Daily  . mulitivitamin with minerals  1 tablet Oral Daily  . nebivolol  20 mg Oral Daily  . nicotine  21 mg Transdermal Daily  . ondansetron (ZOFRAN) IV  4 mg Intravenous Once  . sodium chloride  3 mL Intravenous Q12H  . thiamine  100 mg Oral Daily   Or  . thiamine  100 mg Intravenous Daily  . DISCONTD: estrogen  (conjugated)-medroxyprogesterone  1 tablet Oral Daily  . DISCONTD: sodium chloride  1,000 mL Intravenous Once   Continuous Infusions:   . 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  Improved.  TSH normal.  Serum cortisol was appropriate.  Continue fluids.  Continue Lexapro for now as I doubt this is causing her hyponatremia.  Dehydration Continue IV fluids.  Improved.  Mild transaminitis Most likely related to alcohol intake. Recommend close outpatient followup.  Alcohol abuse Counseled on cessation. On CIWA protocol.  Continue thiamine and folic acid.  Social work consultation to offer the patient resources for cessation.  Cough Losartan recently discontinued by primary care physician. No signs or symptoms to suggest infection.  Chest x-ray negative for infectious etiology.  Cigarette smoker Recommended cessation.  Generalized weakness Physical therapy evaluation pending.  Left leg pain below the knee Patient reports having a fall about a week ago, imaging negative for fracture.  Lower extremity Doppler of left leg was negative for DVT or SVT.  Small to moderate sized Baker cyst noted in the popliteal fossa which may be causing her symptoms.  Prophylaxis SCDs  Code Status: Full code  Family Communication: Discussed with daughter and daughter's mother-in-law at bedside with patient's permission.  Disposition Plan: Pending further evaluation and treatment. Anticipate home when improved.    LOS: 1 day  Jude Linck A, MD 06/26/2011, 1:57 PM

## 2011-06-26 NOTE — ED Provider Notes (Signed)
Medical screening examination/treatment/procedure(s) were conducted as a shared visit with non-physician practitioner(s) and myself.  I personally evaluated the patient during the encounter  Toy Baker, MD 06/26/11 1048

## 2011-06-27 DIAGNOSIS — F1010 Alcohol abuse, uncomplicated: Secondary | ICD-10-CM

## 2011-06-27 DIAGNOSIS — E871 Hypo-osmolality and hyponatremia: Secondary | ICD-10-CM

## 2011-06-27 DIAGNOSIS — E860 Dehydration: Secondary | ICD-10-CM

## 2011-06-27 LAB — BASIC METABOLIC PANEL
BUN: 3 mg/dL — ABNORMAL LOW (ref 6–23)
CO2: 22 mEq/L (ref 19–32)
Calcium: 7.7 mg/dL — ABNORMAL LOW (ref 8.4–10.5)
Calcium: 8 mg/dL — ABNORMAL LOW (ref 8.4–10.5)
Chloride: 81 mEq/L — ABNORMAL LOW (ref 96–112)
Chloride: 83 mEq/L — ABNORMAL LOW (ref 96–112)
GFR calc Af Amer: >90 mL/min (ref >90)
GFR calc Af Amer: >90 mL/min (ref >90)
GFR calc Af Amer: >90 mL/min (ref >90)
GFR calc non Af Amer: >90 mL/min (ref >90)
GFR calc non Af Amer: >90 mL/min (ref >90)
Glucose, Bld: 97 mg/dL (ref 70–99)
Glucose, Bld: 120 mg/dL — ABNORMAL HIGH (ref 70–99)
Potassium: 3.4 mEq/L — ABNORMAL LOW (ref 3.5–5.1)
Potassium: 3.2 mEq/L — ABNORMAL LOW (ref 3.5–5.1)
Potassium: 3.3 mEq/L — ABNORMAL LOW (ref 3.5–5.1)
Sodium: 110 mEq/L — CL (ref 135–145)
Sodium: 113 mEq/L — CL (ref 135–145)
Sodium: 116 mEq/L — CL (ref 135–145)

## 2011-06-27 LAB — CBC
MCH: 36.4 pg — ABNORMAL HIGH (ref 26.0–34.0)
Platelets: 246 10*3/uL (ref 150–400)
RBC: 2.94 MIL/uL — ABNORMAL LOW (ref 3.87–5.11)
WBC: 7.8 10*3/uL (ref 4.0–10.5)

## 2011-06-27 MED ORDER — LORAZEPAM 2 MG/ML IJ SOLN
2.0000 mg | INTRAMUSCULAR | Status: AC | PRN
  Administered 2011-06-27 – 2011-06-28 (×4): 2 mg via INTRAVENOUS
  Filled 2011-06-27 (×3): qty 1

## 2011-06-27 MED ORDER — TRAMADOL HCL 50 MG PO TABS
50.0000 mg | ORAL_TABLET | Freq: Four times a day (QID) | ORAL | Status: DC | PRN
  Administered 2011-06-27 – 2011-07-01 (×8): 50 mg via ORAL
  Filled 2011-06-27 (×9): qty 1

## 2011-06-27 NOTE — Progress Notes (Signed)
Subjective: Reports feeling weak.  Complaining of the pain that she has had in her left leg.  Objective: Vital signs in last 24 hours: Filed Vitals:   06/26/11 2112 06/27/11 0016 06/27/11 0510 06/27/11 0625  BP: 161/89 150/87 139/79 150/84  Pulse: 83 83 88 88  Temp: 98.7 F (37.1 C) 97.7 F (36.5 C) 98.3 F (36.8 C)   TempSrc: Oral Oral Oral   Resp: 16 20 20    Height:      Weight:      SpO2: 97% 94% 95%    Weight change:   Intake/Output Summary (Last 24 hours) at 06/27/11 1131 Last data filed at 06/27/11 0510  Gross per 24 hour  Intake    840 ml  Output   1250 ml  Net   -410 ml    Physical Exam: General: Awake, Oriented, No acute distress, tremulous at times. HEENT: EOMI. Neck: Supple CV: S1 and S2 Lungs: Clear to ascultation bilaterally Abdomen: Soft, Nontender, Nondistended, +bowel sounds. Ext: Good pulses. Trace edema.  Lab Results:  [facility] 06/27/11 0930 06/26/11 0601  NA 110* 124*  K 3.5 3.5  CL 78* 90*  CO2 22 23  GLUCOSE 97 87  BUN 3* 3*  CREATININE 0.57 0.65  CALCIUM 7.7* 8.6  MG -- --  PHOS -- --    Basename 06/26/11 0601 06/25/11 1945  AST 65* 76*  ALT 33 38*  ALKPHOS 48 57  BILITOT 0.6 0.7  PROT 5.8* 6.3  ALBUMIN 3.3* 3.7    Basename 06/25/11 1945  LIPASE 48  AMYLASE --    Basename 06/27/11 0930 06/25/11 1945  WBC 7.8 5.9  NEUTROABS -- 4.4  HGB 10.7* 11.2*  HCT 29.0* 30.2*  MCV 98.6 95.9  PLT 246 265   No results found for this basename: CKTOTAL:3,CKMB:3,CKMBINDEX:3,TROPONINI:3 in the last 72 hours No components found with this basename: POCBNP:3 No results found for this basename: DDIMER:2 in the last 72 hours No results found for this basename: HGBA1C:2 in the last 72 hours No results found for this basename: CHOL:2,HDL:2,LDLCALC:2,TRIG:2,CHOLHDL:2,LDLDIRECT:2 in the last 72 hours  Basename 06/26/11 0050  TSH 3.392  T4TOTAL --  T3FREE --  THYROIDAB --   No results found for this basename:  VITAMINB12:2,FOLATE:2,FERRITIN:2,TIBC:2,IRON:2,RETICCTPCT:2 in the last 72 hours  Micro Results: No results found for this or any previous visit (from the past 240 hour(s)).  Studies/Results: Dg Chest 2 View  06/26/2011  *RADIOLOGY REPORT*  Clinical Data: Cough.  CHEST - 2 VIEW 06/26/2011:  Comparison: Two-view chest x-ray yesterday and 11/28/2008.  Findings: Cardiac silhouette upper normal in size to slightly enlarged, unchanged.  Thoracic aorta mildly atherosclerotic, unchanged.  Hilar and mediastinal contours otherwise unremarkable. Linear atelectasis in the right lower lobe.  Lungs otherwise clear. Pulmonary vascularity normal.  Emphysematous changes in the upper lobes.  Bronchovascular markings normal.  No pleural effusions. Degenerative changes involving the thoracic spine.  Bilateral breast prostheses.  IMPRESSION: Stable borderline mild cardiomegaly.  Mild linear atelectasis in the right lower lobe.  No acute cardiopulmonary disease otherwise. COPD/emphysema.  Original Report Authenticated By: Arnell Sieving, M.D.   Dg Knee 1-2 Views Left  06/26/2011  *RADIOLOGY REPORT*  Clinical Data: Pain without trauma  LEFT KNEE - 1-2 VIEW  Comparison: None.  Findings: No effusion.  There is narrowing of the articular cartilage in the medial compartment with small marginal spurs from the medial femoral condyle.  There are small patellar spurs. Negative for fracture, dislocation, or other acute bony abnormality.  Normal mineralization  and alignment.  IMPRESSION: 1.  Negative for fracture or other acute bone injury. 2.  Degenerative changes most marked in the medial compartment.  Original Report Authenticated By: Osa Craver, M.D.   Dg Tibia/fibula Left  06/26/2011  *RADIOLOGY REPORT*  Clinical Data: Pain without trauma  LEFT TIBIA AND FIBULA - 2 VIEW  Comparison: None.  Findings: Negative for fracture, dislocation, or other acute abnormality.  Normal alignment and mineralization. No significant  degenerative change.  Regional soft tissues unremarkable.  IMPRESSION:  Negative  Original Report Authenticated By: Osa Craver, M.D.    Medications: I have reviewed the patient's current medications. Scheduled Meds:    . buPROPion  300 mg Oral Daily  . escitalopram  20 mg Oral Daily  . estrogens (conjugated)  0.625 mg Oral Daily  . famotidine  20 mg Oral BID  . folic acid  1 mg Oral Daily  . LORazepam  0-4 mg Intravenous Q6H   Followed by  . LORazepam  0-4 mg Intravenous Q12H  . medroxyPROGESTERone  2.5 mg Oral Daily  . mulitivitamin with minerals  1 tablet Oral Daily  . nebivolol  20 mg Oral Daily  . nicotine  21 mg Transdermal Daily  . sodium chloride  3 mL Intravenous Q12H  . thiamine  100 mg Oral Daily   Or  . thiamine  100 mg Intravenous Daily   Continuous Infusions:    . 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  Worse today.  TSH normal.  Serum cortisol was appropriate.  Continue fluids, fluid restrict the patient to 1.5 L.  Discontinue Lexapro.  Appreciate a renal consultation.  Dehydration Continue IV fluids.  Improved.  Mild transaminitis Most likely related to alcohol intake. Recommend close outpatient followup.  Alcohol abuse Counseled on cessation. On CIWA protocol.  Continue thiamine and folic acid.  Social work consultation to offer the patient resources for cessation.  Cough Losartan recently discontinued by primary care physician. No signs or symptoms to suggest infection.  Chest x-ray negative for infectious etiology.  Cigarette smoker Recommended cessation.  Generalized weakness Physical therapy evaluation pending.  Left leg pain below the knee Patient reports  having a fall about a week ago, imaging negative for fracture.  Lower extremity Doppler performed on 06/26/2011 of left leg was negative for DVT or SVT, small to moderate sized Baker cyst noted in the popliteal fossa likely incidental finding.  Prophylaxis SCDs  Code Status: Full code  Family Communication: Discussed with daughter and brother at bedside.  Disposition Plan: Pending further improvement in sodium. Anticipate home when improved.   LOS: 2 days  Dominic Rhome A, MD 06/27/2011, 11:31 AM

## 2011-06-27 NOTE — Evaluation (Signed)
Physical Therapy Evaluation Patient Details Name: Mindy Young MRN: [id number] DOB: [birth year] Today's Date: 06/27/2011 Time: 4098-1191 PT Time Calculation (min): 12 min  PT Assessment / Plan / Recommendation Clinical Impression  Pt admitted for hyponatremia.  Pt would benefit from acute PT services in order to improve independence with transfers and ambulation for safe d/c home with family to assist.  Daughter present on eval and reports she can stay with pt upon d/c (pt lives alone).  Recommend RW and HHPT upon d/c depending on progress.    PT Assessment  Patient needs continued PT services    Follow Up Recommendations  Home health PT;Other (comment) (depending on progress)    Equipment Recommendations  Rolling walker with 5" wheels (depending on progress)    Frequency Min 3X/week    Precautions / Restrictions Precautions Precautions: Fall   Pertinent Vitals/Pain       Mobility  Bed Mobility Bed Mobility: Supine to Sit Supine to Sit: 5: Supervision Transfers Transfers: Sit to Stand;Stand to Sit Sit to Stand: 4: Min guard;From bed Stand to Sit: 4: Min guard;To chair/3-in-1 Details for Transfer Assistance: verbal cues for safety Ambulation/Gait Ambulation/Gait Assistance: 4: Min assist Ambulation Distance (Feet): 120 Feet Assistive device: Other (Comment) Ambulation/Gait Assistance Details: pt with bilateral UEs on IV pole to steady, pt required steadying occasionally but not LOB Gait Pattern: Decreased stride length;Step-through pattern Gait velocity: decreased    Exercises     PT Goals Acute Rehab PT Goals PT Goal Formulation: With patient Time For Goal Achievement: 07/04/11 Potential to Achieve Goals: Good Pt will go Sit to Stand: with modified independence PT Goal: Sit to Stand - Progress: Goal set today Pt will go Stand to Sit: with modified independence PT Goal: Stand to Sit - Progress: Goal set today Pt will Ambulate: >150 feet;with modified  independence;with least restrictive assistive device PT Goal: Ambulate - Progress: Goal set today Pt will Go Up / Down Stairs: 1-2 stairs;with least restrictive assistive device;with modified independence PT Goal: Up/Down Stairs - Progress: Goal set today  Visit Information  Last PT Received On: 06/27/11 Assistance Needed: +1    Subjective Data  Subjective: "I have a 21 week old puppy at home."  family reports 29-51 month old labradoodle   Prior Functioning  Home Living Lives With: Alone Available Help at Discharge: Family;Available 24 hours/day Type of Home: House Home Access: Level entry Home Layout: Two level Alternate Level Stairs-Number of Steps: 2 Alternate Level Stairs-Rails: Right Home Adaptive Equipment: None Prior Function Level of Independence: Independent Communication Communication: No difficulties    Cognition  Overall Cognitive Status: Impaired Area of Impairment: Safety/judgement Arousal/Alertness: Awake/alert Safety/Judgement: Decreased awareness of need for assistance Cognition - Other Comments: pt would attempt to answer questions then start talking about unrelated topics.     Extremity/Trunk Assessment Right Upper Extremity Assessment RUE ROM/Strength/Tone: [facility] for tasks assessed Left Upper Extremity Assessment LUE ROM/Strength/Tone: WFL for tasks assessed Right Lower Extremity Assessment RLE ROM/Strength/Tone: [facility] for tasks assessed Left Lower Extremity Assessment LLE ROM/Strength/Tone: WFL for tasks assessed   Balance    End of Session PT - End of Session Activity Tolerance: Patient tolerated treatment well Patient left: in chair;with call bell/phone within reach;with family/visitor present   Langley Ingalls,KATHrine E 06/27/2011, 3:26 PM Pager: [phone]

## 2011-06-27 NOTE — Progress Notes (Signed)
UR completed 
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
## 2011-06-27 NOTE — Progress Notes (Signed)
Lab called to notify of a critical sodium level 110.   MCCLAIN, Willman Cuny L RN 06/27/2011

## 2011-06-27 NOTE — Progress Notes (Signed)
MD notified of Critical Value of NA+ 116.   No new orders placed at this time.   MCCLAIN, Brezlyn Manrique L 06/27/2011 8:11 PM

## 2011-06-28 DIAGNOSIS — E871 Hypo-osmolality and hyponatremia: Secondary | ICD-10-CM

## 2011-06-28 DIAGNOSIS — F1010 Alcohol abuse, uncomplicated: Secondary | ICD-10-CM

## 2011-06-28 DIAGNOSIS — E860 Dehydration: Secondary | ICD-10-CM

## 2011-06-28 LAB — BASIC METABOLIC PANEL
CO2: 24 mEq/L (ref 19–32)
CO2: 22 mEq/L (ref 19–32)
CO2: 22 mEq/L (ref 19–32)
Chloride: 80 mEq/L — ABNORMAL LOW (ref 96–112)
Chloride: 85 mEq/L — ABNORMAL LOW (ref 96–112)
Chloride: 83 mEq/L — ABNORMAL LOW (ref 96–112)
GFR calc Af Amer: >90 mL/min (ref >90)
GFR calc non Af Amer: >90 mL/min (ref >90)
Glucose, Bld: 131 mg/dL — ABNORMAL HIGH (ref 70–99)
Potassium: 3.1 mEq/L — ABNORMAL LOW (ref 3.5–5.1)
Potassium: 3.7 mEq/L (ref 3.5–5.1)
Sodium: 117 mEq/L — CL (ref 135–145)
Sodium: 115 mEq/L — CL (ref 135–145)
Sodium: 117 mEq/L — CL (ref 135–145)

## 2011-06-28 LAB — OSMOLALITY, URINE: Osmolality, Ur: 194 mOsm/kg — ABNORMAL LOW (ref 390–1090)

## 2011-06-28 LAB — OSMOLALITY: Osmolality: 234 mOsm/kg — CL (ref 275–300)

## 2011-06-28 MED ORDER — HYDRALAZINE HCL 20 MG/ML IJ SOLN
INTRAMUSCULAR | Status: AC
  Filled 2011-06-28: qty 1

## 2011-06-28 MED ORDER — POTASSIUM CHLORIDE CRYS ER 20 MEQ PO TBCR
40.0000 meq | EXTENDED_RELEASE_TABLET | ORAL | Status: AC
  Administered 2011-06-28 (×2): 40 meq via ORAL
  Filled 2011-06-28 (×2): qty 2

## 2011-06-28 MED ORDER — SIMETHICONE 80 MG PO CHEW
80.0000 mg | CHEWABLE_TABLET | Freq: Four times a day (QID) | ORAL | Status: DC | PRN
  Administered 2011-06-28: 80 mg via ORAL
  Filled 2011-06-28: qty 1

## 2011-06-28 MED ORDER — HYDRALAZINE HCL 20 MG/ML IJ SOLN
10.0000 mg | Freq: Once | INTRAMUSCULAR | Status: AC
  Administered 2011-06-28: 10 mg via INTRAVENOUS

## 2011-06-28 NOTE — Progress Notes (Signed)
Physical Therapy Treatment Patient Details Name: Mindy Young MRN: [id number] DOB: 04-14-1945 Today's Date: 06/28/2011 Time: 1355-1410 PT Time Calculation (min): 15 min  PT Assessment / Plan / Recommendation Comments on Treatment Session  Pt ambulated in hallway and continues to demonstrate unsteady gait.  Pt and family aware for pt to have assist for OOB due to unsteadiness and for pt safety.    Follow Up Recommendations  Home health PT    Equipment Recommendations  Rolling walker with 5" wheels;Other (comment) (depending on progress)    Frequency     Plan Discharge plan remains appropriate;Frequency remains appropriate    Precautions / Restrictions Precautions Precautions: Fall   Pertinent Vitals/Pain     Mobility  Bed Mobility Bed Mobility: Supine to Sit;Sit to Supine Supine to Sit: 5: Supervision Sit to Supine: 5: Supervision Details for Bed Mobility Assistance: verbal cue for removing blankets prior to sitting Transfers Transfers: Sit to Stand;Stand to Sit Sit to Stand: 4: Min guard;From bed Stand to Sit: 4: Min guard;To bed Ambulation/Gait Ambulation/Gait Assistance: 4: Min guard Ambulation Distance (Feet): 300 Feet Assistive device: Other (Comment) (pushing IV pole) Ambulation/Gait Assistance Details: pt pushed IV pole and then encouraged to ambulate without UE assist for 40 feet, pt remains unsteady however no LOB observed, gait speed decreased without UE support Gait Pattern: Step-through pattern;Narrow base of support;Decreased stride length Gait velocity: decreased    Exercises     PT Goals Acute Rehab PT Goals PT Goal: Sit to Stand - Progress: Progressing toward goal PT Goal: Stand to Sit - Progress: Progressing toward goal PT Goal: Ambulate - Progress: Progressing toward goal  Visit Information  Last PT Received On: 06/28/11 Assistance Needed: +1    Subjective Data  Subjective: "I feel a headache coming on."   Cognition  Overall Cognitive Status:  Impaired Area of Impairment: Safety/judgement Arousal/Alertness: Awake/alert Safety/Judgement: Decreased awareness of need for assistance    Balance     End of Session PT - End of Session Equipment Utilized During Treatment: Gait belt Activity Tolerance: Patient tolerated treatment well Patient left: in bed;with bed alarm set;with call bell/phone within reach;with family/visitor present    Caedin Mogan,KATHrine E 06/28/2011, 3:23 PM Pager: [phone]

## 2011-06-28 NOTE — Progress Notes (Signed)
Subjective: Patient reports feeling weak still.  Had a small fall during the night as she was attempting to go to the bathroom and has a small bump on her left side of the head.  Objective: Vital signs in last 24 hours: Filed Vitals:   06/27/11 2144 06/28/11 0033 06/28/11 0532 06/28/11 0816  BP: 158/97 152/85 169/119 137/87  Pulse: 83 78 110 97  Temp: 97.7 F (36.5 C) 97.8 F (36.6 C) 97.6 F (36.4 C) 98.5 F (36.9 C)  TempSrc: Oral Axillary Axillary Oral  Resp: 18 17 18 20   Height:      Weight:      SpO2: 95% 91% 99% 95%   Weight change:   Intake/Output Summary (Last 24 hours) at 06/28/11 1116 Last data filed at 06/28/11 0600  Gross per 24 hour  Intake   4700 ml  Output    100 ml  Net   4600 ml    Physical Exam: General: Awake, Oriented, No acute distress, tremulous at times. HEENT: EOMI, small hematoma noted over left side of head in the scalp. Neck: Supple CV: S1 and S2 Lungs: Clear to ascultation bilaterally Abdomen: Soft, Nontender, Nondistended, +bowel sounds. Ext: Good pulses. Trace edema. Neuro: Cranial nerves II-XII grossly intact.  Lab Results:  [facility] 06/28/11 0915 06/28/11 0355  NA 120* 117*  K 3.1* 3.2*  CL 85* 80*  CO2 24 24  GLUCOSE 101* 95  BUN 3* 3*  CREATININE 0.53 0.58  CALCIUM 7.9* 8.2*  MG -- --  PHOS -- --    Basename 06/26/11 0601 06/25/11 1945  AST 65* 76*  ALT 33 38*  ALKPHOS 48 57  BILITOT 0.6 0.7  PROT 5.8* 6.3  ALBUMIN 3.3* 3.7    Basename 06/25/11 1945  LIPASE 48  AMYLASE --    Basename 06/27/11 0930 06/25/11 1945  WBC 7.8 5.9  NEUTROABS -- 4.4  HGB 10.7* 11.2*  HCT 29.0* 30.2*  MCV 98.6 95.9  PLT 246 265   No results found for this basename: CKTOTAL:3,CKMB:3,CKMBINDEX:3,TROPONINI:3 in the last 72 hours No components found with this basename: POCBNP:3 No results found for this basename: DDIMER:2 in the last 72 hours No results found for this basename: HGBA1C:2 in the last 72 hours No results found for this  basename: CHOL:2,HDL:2,LDLCALC:2,TRIG:2,CHOLHDL:2,LDLDIRECT:2 in the last 72 hours  Basename 06/26/11 0050  TSH 3.392  T4TOTAL --  T3FREE --  THYROIDAB --   No results found for this basename: VITAMINB12:2,FOLATE:2,FERRITIN:2,TIBC:2,IRON:2,RETICCTPCT:2 in the last 72 hours  Micro Results: No results found for this or any previous visit (from the past 240 hour(s)).  Studies/Results: Dg Knee 1-2 Views Left  06/26/2011  *RADIOLOGY REPORT*  Clinical Data: Pain without trauma  LEFT KNEE - 1-2 VIEW  Comparison: None.  Findings: No effusion.  There is narrowing of the articular cartilage in the medial compartment with small marginal spurs from the medial femoral condyle.  There are small patellar spurs. Negative for fracture, dislocation, or other acute bony abnormality.  Normal mineralization and alignment.  IMPRESSION: 1.  Negative for fracture or other acute bone injury. 2.  Degenerative changes most marked in the medial compartment.  Original Report Authenticated By: Osa Craver, M.D.   Dg Tibia/fibula Left  06/26/2011  *RADIOLOGY REPORT*  Clinical Data: Pain without trauma  LEFT TIBIA AND FIBULA - 2 VIEW  Comparison: None.  Findings: Negative for fracture, dislocation, or other acute abnormality.  Normal alignment and mineralization. No significant degenerative change.  Regional soft tissues unremarkable.  IMPRESSION:  Negative  Original Report Authenticated By: Osa Craver, M.D.    Medications: I have reviewed the patient's current medications. Scheduled Meds:    . buPROPion  300 mg Oral Daily  . estrogens (conjugated)  0.625 mg Oral Daily  . famotidine  20 mg Oral BID  . folic acid  1 mg Oral Daily  . hydrALAZINE      . hydrALAZINE  10 mg Intravenous Once  . LORazepam  0-4 mg Intravenous Q6H   Followed by  . LORazepam  0-4 mg Intravenous Q12H  . medroxyPROGESTERone  2.5 mg Oral Daily  . mulitivitamin with minerals  1 tablet Oral Daily  . nebivolol  20 mg Oral  Daily  . nicotine  21 mg Transdermal Daily  . potassium chloride  40 mEq Oral Q4H  . sodium chloride  3 mL Intravenous Q12H  . thiamine  100 mg Oral Daily   Or  . thiamine  100 mg Intravenous Daily  . DISCONTD: escitalopram  20 mg Oral Daily   Continuous Infusions:    . 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  Improved today with normal saline and fluid restriction.  TSH normal.  Serum cortisol was appropriate.  Discontinued Lexapro.  Appreciate a renal consultation.  Sodium improved from 110 yesterday to 120 today.  Dehydration Continue IV fluids.  Improved.  Mild transaminitis Most likely related to alcohol intake. Recommend close outpatient followup.  Alcohol abuse Counseled on cessation. On CIWA protocol, suspect patient may need a few more days worth of Ativan/Librium for agitation/anxiety after completing CIWA protocol.  Continue thiamine and folic acid.  Social work offered patient resources on alcohol cessation.  Cough Losartan recently discontinued by primary care physician. No signs or symptoms to suggest infection.  Chest x-ray negative for infectious etiology.  Cigarette smoker Recommended cessation.  Generalized weakness Physical therapy recommending home health physical therapy with rolling walker at discharge.  Fall on 06/28/2011 with left head small hematoma Likely due to generalized weakness.  Patient is neurologically intact on my examination today.  Do neuro checks every 8 hours x2, if not eventful then no further workup or evaluation.  Hypokalemia Replace as needed.  Left leg pain below the knee Patient reports having a fall about a week ago, imaging negative for fracture.  Lower  extremity Doppler performed on 06/26/2011 of left leg was negative for DVT or SVT, small to moderate sized Baker cyst noted in the popliteal fossa likely incidental finding.  Prophylaxis SCDs  Code Status: Full code  Family Communication: Discussed with daughter and brother at bedside.  Disposition Plan: Pending further improvement in sodium. Anticipate discharge home with home health PT and 5 inch rolling walker.   LOS: 3 days  Herby Amick A, MD 06/28/2011, 11:16 AM

## 2011-06-28 NOTE — Progress Notes (Signed)
06-28-11 Received referral for PT and rw. Left agency list at bedside with family. Will f/u tomorrow for what agency was chosen.  [place], [place]  [phone]

## 2011-06-28 NOTE — Progress Notes (Signed)
CSW met with patient and patients daughter at bedside. CSW provided patient with substance abuse treatment resources and a list of local AA meetings. Patient agreeable to receiving information. No other CSW needs noted.  Mindy Young C. Shyah Cadmus MSW, LCSW [phone]

## 2011-06-28 NOTE — Progress Notes (Signed)
  [facility] Progress Note  Subjective:  Per daughter, has been sleeping a lot today but when awake is much more lucid, not confused, word finding issues better.  They are trying to force her to eat when she is awake, but her food intake has been fairly limited.  She had a couple of falls last night (had been falling at home PTA) Objective Filed Vitals:   06/28/11 0033 06/28/11 0532 06/28/11 0816 06/28/11 1446  BP: 152/85 169/119 137/87 153/77  Pulse: 78 110 97 98  Temp: 97.8 F (36.6 C) 97.6 F (36.4 C) 98.5 F (36.9 C) 98.1 F (36.7 C)  TempSrc: Axillary Axillary Oral Oral  Resp: 17 18 20 18   Height:      Weight:      SpO2: 91% 99% 95% 98%  Additional Objective Labs: Basic Metabolic Panel:  Lab 06/28/11 1610 06/28/11 0355 06/27/11 2135  NA 120* 117* 116*  K 3.1* 3.2* 3.3*  CL 85* 80* 83*  CO2 24 24 23   GLUCOSE 101* 95 124*  BUN 3* 3* 3*  CREATININE 0.53 0.58 0.60  CALCIUM 7.9* 8.2* 8.0*  ALB -- -- --  PHOS -- -- --   Liver Function Tests:  Lab 06/26/11 0601 06/25/11 1945  AST 65* 76*  ALT 33 38*  ALKPHOS 48 57  BILITOT 0.6 0.7  PROT 5.8* 6.3  ALBUMIN 3.3* 3.7  CBC:  Lab 06/27/11 0930 06/25/11 1945  WBC 7.8 5.9  NEUTROABS -- 4.4  HGB 10.7* 11.2*  HCT 29.0* 30.2*  MCV 98.6 95.9  PLT 246 265   Studies/Results: No results found. Medications: . sodium chloride 100 mL/hr at 06/28/11 1301   . buPROPion  300 mg Oral Daily  . estrogens (conjugated)  0.625 mg Oral Daily  . famotidine  20 mg Oral BID  . folic acid  1 mg Oral Daily  . hydrALAZINE      . hydrALAZINE  10 mg Intravenous Once  . LORazepam  0-4 mg Intravenous Q6H   Followed by  . LORazepam  0-4 mg Intravenous Q12H  . medroxyPROGESTERone  2.5 mg Oral Daily  . mulitivitamin with minerals  1 tablet Oral Daily  . nebivolol  20 mg Oral Daily  . nicotine  21 mg Transdermal Daily  . potassium chloride  40 mEq Oral Q4H  . sodium chloride  3 mL Intravenous Q12H  . thiamine  100 mg  Oral Daily   Or  . thiamine  100 mg Intravenous Daily  . DISCONTD: escitalopram  20 mg Oral Daily   Assessment/Recommendations: Severe hyponatremia - in setting of excessive alcohol intake, low protein intake, and urine studies not consistent with ADH effect - believe this is still consistent with 'beer drinker's potomania'.  Sodium is slowly increasing with normal saline administration.  Needs additional potassium replacement.    I would continue with the current isotonic fluids, water restriction and potassium repletion (I see that 2 doses of 40 are ordered - likely to require more but labs are being done frequently so will know).   Will continue to follow. 06/28/2011,3:24 PM  LOS: 3 days

## 2011-06-29 ENCOUNTER — Encounter (HOSPITAL_COMMUNITY): Payer: Self-pay | Admitting: Internal Medicine

## 2011-06-29 DIAGNOSIS — E860 Dehydration: Secondary | ICD-10-CM

## 2011-06-29 DIAGNOSIS — E871 Hypo-osmolality and hyponatremia: Secondary | ICD-10-CM

## 2011-06-29 DIAGNOSIS — F1010 Alcohol abuse, uncomplicated: Secondary | ICD-10-CM

## 2011-06-29 DIAGNOSIS — D649 Anemia, unspecified: Secondary | ICD-10-CM

## 2011-06-29 HISTORY — DX: Anemia, unspecified: D64.9

## 2011-06-29 LAB — VITAMIN B12: Vitamin B-12: 1349 pg/mL — ABNORMAL HIGH (ref 211–911)

## 2011-06-29 LAB — BASIC METABOLIC PANEL
BUN: 4 mg/dL — ABNORMAL LOW (ref 6–23)
CO2: 23 mEq/L (ref 19–32)
Chloride: 84 mEq/L — ABNORMAL LOW (ref 96–112)
Chloride: 82 mEq/L — ABNORMAL LOW (ref 96–112)
Chloride: 83 mEq/L — ABNORMAL LOW (ref 96–112)
GFR calc Af Amer: >90 mL/min (ref >90)
GFR calc Af Amer: >90 mL/min (ref >90)
GFR calc Af Amer: >90 mL/min (ref >90)
GFR calc non Af Amer: >90 mL/min (ref >90)
Potassium: 4.1 mEq/L (ref 3.5–5.1)
Potassium: 3.8 mEq/L (ref 3.5–5.1)
Potassium: 3.5 mEq/L (ref 3.5–5.1)
Sodium: 119 mEq/L — CL (ref 135–145)
Sodium: 116 mEq/L — CL (ref 135–145)

## 2011-06-29 LAB — MAGNESIUM: Magnesium: 1.4 mg/dL — ABNORMAL LOW (ref 1.5–2.5)

## 2011-06-29 LAB — IRON AND TIBC
Saturation Ratios: 11 % — ABNORMAL LOW (ref 20–55)
TIBC: 277 ug/dL (ref 250–470)

## 2011-06-29 LAB — FOLATE: Folate: >20 ng/mL

## 2011-06-29 MED ORDER — IBUPROFEN 600 MG PO TABS
600.0000 mg | ORAL_TABLET | Freq: Three times a day (TID) | ORAL | Status: DC | PRN
  Administered 2011-06-29 – 2011-07-01 (×4): 600 mg via ORAL
  Filled 2011-06-29 (×4): qty 1

## 2011-06-29 MED ORDER — POTASSIUM CHLORIDE CRYS ER 20 MEQ PO TBCR
40.0000 meq | EXTENDED_RELEASE_TABLET | Freq: Once | ORAL | Status: DC
  Filled 2011-06-29: qty 2

## 2011-06-29 MED ORDER — POTASSIUM CHLORIDE CRYS ER 20 MEQ PO TBCR
40.0000 meq | EXTENDED_RELEASE_TABLET | ORAL | Status: AC
  Administered 2011-06-29 (×2): 40 meq via ORAL
  Filled 2011-06-29 (×2): qty 2

## 2011-06-29 MED ORDER — MAGNESIUM SULFATE 50 % IJ SOLN
3.0000 g | Freq: Once | INTRAVENOUS | Status: AC
  Administered 2011-06-29: 3 g via INTRAVENOUS
  Filled 2011-06-29: qty 6

## 2011-06-29 NOTE — Progress Notes (Signed)
[facility] Progress Note  Subjective:  Mentally much clearer and daughter concurs - no episodes of confusion today Only complaint right now is leg cramps Has not been allowed OOB except with assist since falls 2 days ago but daughter says when Mom IS up she is much more steady on her feet. Objective Filed Vitals:   06/28/11 0816 06/28/11 1446 06/28/11 2200 06/29/11 0600  BP: 137/87 153/77 149/76 154/82  Pulse: 97 98 81 86  Temp: 98.5 F (36.9 C) 98.1 F (36.7 C) 97.5 F (36.4 C) 98 F (36.7 C)  TempSrc: Oral Oral Oral Oral  Resp: 20 18 18 18   Height:      Weight:      SpO2: 95% 98% 98% 100%  BP 154/82  Pulse 86  Temp(Src) 98 F (36.7 C) (Oral)  Resp 18  Ht 5\' 6"  (1.676 m)  Wt 72.576 kg (160 lb)  BMI 25.82 kg/m2  SpO2 100% Physical Exam General:Bruises right eye and on forehead as well as left arm but alert, oriented Heart:S1S2 No S3 Lungs:Clear Abdomen:soft NT Extremities:No edema Additional Objective Labs: Basic Metabolic Panel:  Lab 06/29/11 1610 06/29/11 0325 06/28/11 2107  NA 119* 115* 117*  K 3.8 4.1 3.7  CL 86* 84* 84*  CO2 23 20 22   GLUCOSE 103* 96 131*  BUN 4* 4* 4*  CREATININE 0.55 0.54 0.58  CALCIUM 8.4 8.3* 8.1*  ALB -- -- --  PHOS -- 2.9 --   Liver Function Tests:  Lab 06/26/11 0601 06/25/11 1945  AST 65* 76*  ALT 33 38*  ALKPHOS 48 57  BILITOT 0.6 0.7  PROT 5.8* 6.3  ALBUMIN 3.3* 3.7  CBC:  Lab 06/27/11 0930 06/25/11 1945  WBC 7.8 5.9  NEUTROABS -- 4.4  HGB 10.7* 11.2*  HCT 29.0* 30.2*  MCV 98.6 95.9  PLT 246 265  Medications:    . sodium chloride 100 mL/hr at 06/28/11 1301   . buPROPion  300 mg Oral Daily  . estrogens (conjugated)  0.625 mg Oral Daily  . famotidine  20 mg Oral BID  . folic acid  1 mg Oral Daily  . hydrALAZINE      . LORazepam  0-4 mg Intravenous Q12H  . magnesium sulfate 1 - 4 g bolus IVPB  3 g Intravenous Once  . medroxyPROGESTERone  2.5 mg Oral Daily  . mulitivitamin with minerals  1  tablet Oral Daily  . nebivolol  20 mg Oral Daily  . nicotine  21 mg Transdermal Daily  . potassium chloride  40 mEq Oral Q4H  . sodium chloride  3 mL Intravenous Q12H  . thiamine  100 mg Oral Daily   Or  . thiamine  100 mg Intravenous Daily   \Assessment/Recommendations Severe hyponatremia - in setting of excessive alcohol intake, low protein intake, and urine studies not consistent with ADH effect - believe this is still consistent with 'beer drinker's potomania'. Sodium is slowly increasing with normal saline administration. Mental status is clearly better. Her sodium problem has been subacutely worse but she has been hyponatremic for some time (129 Feb 2010) so very slow rate of increase is actually good, although am a bit surprised that she has corrected as slowly as she has.   1. I would like to repeat the urine studies to see if still fits the picture of beer drinkers potomania.  2. Will also give additional potassium.  I note that Mg has also been replaced and will recheck level in AM  Miken Stecher B 06/29/2011,2:32 PM  LOS: 4 days

## 2011-06-29 NOTE — Progress Notes (Signed)
Subjective: Patient is more alert and conversant and improving clinically per family. Patient eating about 1/3 of food.  Objective: Vital signs in last 24 hours: Filed Vitals:   06/28/11 0816 06/28/11 1446 06/28/11 2200 06/29/11 0600  BP: 137/87 153/77 149/76 154/82  Pulse: 97 98 81 86  Temp: 98.5 F (36.9 C) 98.1 F (36.7 C) 97.5 F (36.4 C) 98 F (36.7 C)  TempSrc: Oral Oral Oral Oral  Resp: 20 18 18 18   Height:      Weight:      SpO2: 95% 98% 98% 100%   Weight change:   Intake/Output Summary (Last 24 hours) at 06/29/11 1259 Last data filed at 06/29/11 0600  Gross per 24 hour  Intake   1320 ml  Output      0 ml  Net   1320 ml    Physical Exam: General: Awake, Oriented, No acute distress, tremulous at times. HEENT: EOMI, small hematoma noted over left side of head in the scalp. Neck: Supple CV: S1 and S2 Lungs: Clear to ascultation bilaterally Abdomen: Soft, Nontender, Nondistended, +bowel sounds. Ext: Good pulses. Trace edema. Neuro: Cranial nerves II-XII grossly intact.  Lab Results:  Basename 06/29/11 0910 06/29/11 0325  NA 119* 115*  K 3.8 4.1  CL 86* 84*  CO2 23 20  GLUCOSE 103* 96  BUN 4* 4*  CREATININE 0.55 0.54  CALCIUM 8.4 8.3*  MG -- 1.4*  PHOS -- 2.9   No results found for this basename: AST:2,ALT:2,ALKPHOS:2,BILITOT:2,PROT:2,ALBUMIN:2 in the last 72 hours No results found for this basename: LIPASE:2,AMYLASE:2 in the last 72 hours  Basename 06/27/11 0930  WBC 7.8  NEUTROABS --  HGB 10.7*  HCT 29.0*  MCV 98.6  PLT 246   No results found for this basename: CKTOTAL:3,CKMB:3,CKMBINDEX:3,TROPONINI:3 in the last 72 hours No components found with this basename: POCBNP:3 No results found for this basename: DDIMER:2 in the last 72 hours No results found for this basename: HGBA1C:2 in the last 72 hours No results found for this basename: CHOL:2,HDL:2,LDLCALC:2,TRIG:2,CHOLHDL:2,LDLDIRECT:2 in the last 72 hours No results found for this basename:  TSH,T4TOTAL,FREET3,T3FREE,THYROIDAB in the last 72 hours No results found for this basename: VITAMINB12:2,FOLATE:2,FERRITIN:2,TIBC:2,IRON:2,RETICCTPCT:2 in the last 72 hours  Micro Results: No results found for this or any previous visit (from the past 240 hour(s)).  Studies/Results: No results found.  Medications: I have reviewed the patient's current medications. Scheduled Meds:    . buPROPion  300 mg Oral Daily  . estrogens (conjugated)  0.625 mg Oral Daily  . famotidine  20 mg Oral BID  . folic acid  1 mg Oral Daily  . hydrALAZINE      . LORazepam  0-4 mg Intravenous Q12H  . magnesium sulfate 1 - 4 g bolus IVPB  3 g Intravenous Once  . medroxyPROGESTERone  2.5 mg Oral Daily  . mulitivitamin with minerals  1 tablet Oral Daily  . nebivolol  20 mg Oral Daily  . nicotine  21 mg Transdermal Daily  . potassium chloride  40 mEq Oral Q4H  . sodium chloride  3 mL Intravenous Q12H  . thiamine  100 mg Oral Daily   Or  . thiamine  100 mg Intravenous Daily   Continuous Infusions:    . sodium chloride 100 mL/hr at 06/28/11 1301   PRN Meds:.alum & mag hydroxide-simeth, guaiFENesin-codeine, LORazepam, LORazepam, LORazepam, ondansetron (ZOFRAN) IV, ondansetron, oxyCODONE, simethicone, traMADol  Assessment/Plan: Severe hyponatremia Most likely secondary to "beer drinker's potomania".  Improved today with normal saline and fluid  restriction.  TSH normal.  Serum cortisol was appropriate.  Discontinued Lexapro.  Appreciate a renal consultation.  Sodium now at 119. Renal ff and appreciate input and rxcs.  Dehydration Continue IV fluids.  Improved.  Mild transaminitis Most likely related to alcohol intake. Recommend close outpatient followup.  Alcohol abuse Counseled on cessation. On CIWA protocol, suspect patient may need a few more days worth of Ativan/Librium for agitation/anxiety after completing CIWA protocol.  Continue thiamine and folic acid.  Social work offered patient resources  on alcohol cessation.  Cough Losartan recently discontinued by primary care physician. No signs or symptoms to suggest infection.  Chest x-ray negative for infectious etiology.  Cigarette smoker Recommended cessation.  Generalized weakness Physical therapy recommending home health physical therapy with rolling walker at discharge.  Fall on 06/28/2011 with left head small hematoma Likely due to generalized weakness.  Patient is neurologically intact on my examination today.  Do neuro checks every 8 hours x2, if not eventful then no further workup or evaluation.  Hypokalemia Replace as needed. Replace magnesium.  Anemia Likely secondary to ETOH abuse. H/H stable. No overt GIB. Check an anemia panel.  Left leg pain below the knee Patient reports having a fall about a week ago, imaging negative for fracture.  Lower extremity Doppler performed on 06/26/2011 of left leg was negative for DVT or SVT, small to moderate sized Baker cyst noted in the popliteal fossa likely incidental finding.  Prophylaxis SCDs  Code Status: Full code  Family Communication: Discussed with daughter and brother at bedside.  Disposition Plan: Pending further improvement in sodium. Anticipate discharge home with home health PT and 5 inch rolling walker.   LOS: 4 days  Cate Oravec, MD [phone] 06/29/2011, 12:59 PM

## 2011-06-29 NOTE — Progress Notes (Signed)
Patient's sodium this am 119.  Dr. Janee Morn aware.  Will continue to monitor.

## 2011-06-29 NOTE — Significant Event (Signed)
CRITICAL VALUE ALERT  Critical value received:  Sodium 115  Date of notification:  06/29/2011  Time of notification:  0430  Critical value read back:yes  Nurse who received alert:  Gloriajean Dell  MD notified (1st page):  Schorr  Time of first page:  0430  MD notified (2nd page):  Time of second page:  Responding MD:  Schorr  Time MD responded:  [phone]

## 2011-06-29 NOTE — Progress Notes (Signed)
Patient with healing left elbow wound with butterfly bandage.  Dr. Janee Morn aware.  As per Dr. Janee Morn, RN left voice message for wound care RN to come and see left elbow.  As per Dr. Janee Morn, if wound needs further treatment, he will then place a consult for wound care.

## 2011-06-29 NOTE — Progress Notes (Signed)
Patient's Magnesium this am 1.4.  Dr. Janee Morn aware.  Magnesium sulfate 3grams currently infusing.  Will monitor.

## 2011-06-30 ENCOUNTER — Inpatient Hospital Stay (HOSPITAL_COMMUNITY): Payer: Managed Care, Other (non HMO)

## 2011-06-30 DIAGNOSIS — E860 Dehydration: Secondary | ICD-10-CM

## 2011-06-30 DIAGNOSIS — F1010 Alcohol abuse, uncomplicated: Secondary | ICD-10-CM

## 2011-06-30 DIAGNOSIS — E871 Hypo-osmolality and hyponatremia: Secondary | ICD-10-CM

## 2011-06-30 LAB — CBC
HCT: 32.1 % — ABNORMAL LOW (ref 36.0–46.0)
Hemoglobin: 11.8 g/dL — ABNORMAL LOW (ref 12.0–15.0)
MCV: 98.2 fL (ref 78.0–100.0)
RDW: 13 % (ref 11.5–15.5)
WBC: 6.8 10*3/uL (ref 4.0–10.5)

## 2011-06-30 LAB — HEPATIC FUNCTION PANEL
ALT: 24 U/L (ref 0–35)
AST: 30 U/L (ref 0–37)
Alkaline Phosphatase: 55 U/L (ref 39–117)
Bilirubin, Direct: 0.1 mg/dL (ref 0.0–0.3)
Total Bilirubin: 0.4 mg/dL (ref 0.3–1.2)

## 2011-06-30 LAB — BASIC METABOLIC PANEL
BUN: 6 mg/dL (ref 6–23)
BUN: 5 mg/dL — ABNORMAL LOW (ref 6–23)
BUN: 6 mg/dL (ref 6–23)
BUN: 7 mg/dL (ref 6–23)
CO2: 24 mEq/L (ref 19–32)
Calcium: 8.3 mg/dL — ABNORMAL LOW (ref 8.4–10.5)
Calcium: 8.9 mg/dL (ref 8.4–10.5)
Calcium: 8.6 mg/dL (ref 8.4–10.5)
Chloride: 84 mEq/L — ABNORMAL LOW (ref 96–112)
Creatinine, Ser: 0.58 mg/dL (ref 0.50–1.10)
Creatinine, Ser: 0.54 mg/dL (ref 0.50–1.10)
Creatinine, Ser: 0.53 mg/dL (ref 0.50–1.10)
Creatinine, Ser: 0.6 mg/dL (ref 0.50–1.10)
GFR calc Af Amer: >90 mL/min (ref >90)
GFR calc Af Amer: >90 mL/min (ref >90)
GFR calc non Af Amer: >90 mL/min (ref >90)
GFR calc non Af Amer: >90 mL/min (ref >90)
Glucose, Bld: 121 mg/dL — ABNORMAL HIGH (ref 70–99)
Glucose, Bld: 121 mg/dL — ABNORMAL HIGH (ref 70–99)
Sodium: 119 mEq/L — CL (ref 135–145)

## 2011-06-30 LAB — OSMOLALITY, URINE: Osmolality, Ur: 375 mOsm/kg — ABNORMAL LOW (ref 390–1090)

## 2011-06-30 MED ORDER — MAGNESIUM SULFATE 40 MG/ML IJ SOLN
2.0000 g | Freq: Once | INTRAMUSCULAR | Status: AC
  Administered 2011-06-30: 2 g via INTRAVENOUS
  Filled 2011-06-30: qty 50

## 2011-06-30 MED ORDER — METOCLOPRAMIDE HCL 5 MG/ML IJ SOLN
10.0000 mg | Freq: Once | INTRAMUSCULAR | Status: AC
  Administered 2011-06-30: 10 mg via INTRAVENOUS
  Filled 2011-06-30: qty 2

## 2011-06-30 MED ORDER — GADOBENATE DIMEGLUMINE 529 MG/ML IV SOLN
15.0000 mL | Freq: Once | INTRAVENOUS | Status: AC | PRN
  Administered 2011-06-30: 15 mL via INTRAVENOUS

## 2011-06-30 MED ORDER — NEBIVOLOL HCL 10 MG PO TABS
30.0000 mg | ORAL_TABLET | Freq: Every day | ORAL | Status: DC
  Administered 2011-07-01: 30 mg via ORAL
  Filled 2011-06-30 (×2): qty 3

## 2011-06-30 NOTE — Progress Notes (Signed)
Patient refusing to take oxycodone tablet as it makes her hallucinate very badly.  Reglan 10mg  IV given as ordered.  Headache now an 8 on scale of 1-10.  Nausea improving.  Shut blinds in patient's room, placed cold rag over patient's eyes, and encouraged deep breathing to help patient to relax.  Encouraged patient's family to speak softly and lowered the volume on the television.  Will continue to monitor patient.

## 2011-06-30 NOTE — Progress Notes (Signed)
Sodium level this afternoon 116.  Page placed to Dr. Janee Morn.

## 2011-06-30 NOTE — Progress Notes (Signed)
Patient's sodium level 119 and serum osmolarity 240.  Notified Dr. Janee Morn at this time.  Awaiting orders.  Will continue to monitor patient.

## 2011-06-30 NOTE — Progress Notes (Signed)
Patient returned from MRI complaining of severe headache with nausea.  Patient medicated with Zofran 4mg  IV for nausea and Ultram PO for headache.  Blood pressure 161/101.  Dr. Janee Morn aware.  As per Dr. Janee Morn, give Reglan 10mg  now and oxycodone tablet for headache.  No neuro checks at this time as per Dr. Janee Morn.  Patient awake alert and oriented, following commands without difficulty.  Will continue to monitor patient.

## 2011-06-30 NOTE — Progress Notes (Signed)
Subjective: Patient is more alert and conversant and improving clinically per family. Patient ambulating in hallway. Patient c/o cramps in legs and HA which were improved with pain management.  Objective: Vital signs in last 24 hours: Filed Vitals:   06/29/11 0600 06/29/11 1603 06/29/11 2250 06/30/11 0551  BP: 154/82 150/92 148/80 157/87  Pulse: 86 75 77 80  Temp: 98 F (36.7 C) 97.6 F (36.4 C) 98.1 F (36.7 C) 97.7 F (36.5 C)  TempSrc: Oral Oral Oral Oral  Resp: 18 18  18   Height:      Weight:      SpO2: 100% 99% 100% 100%   Weight change:   Intake/Output Summary (Last 24 hours) at 06/30/11 1156 Last data filed at 06/30/11 1023  Gross per 24 hour  Intake   2300 ml  Output   1576 ml  Net    724 ml    Physical Exam: General: Awake, Oriented, No acute distress, tremulous at times. HEENT: EOMI, small hematoma noted over left side of head in the scalp. Neck: Supple CV: S1 and S2 Lungs: Clear to ascultation bilaterally Abdomen: Soft, Nontender, Nondistended, +bowel sounds. Ext: Good pulses. Trace edema. Neuro: Cranial nerves II-XII grossly intact.  Lab Results:  Basename 06/30/11 0940 06/30/11 0325 06/29/11 0325  NA 119* 117* --  K 4.4 4.6 --  CL 88* 84* --  CO2 23 24 --  GLUCOSE 121* 101* --  BUN 5* 6 --  CREATININE 0.54 0.58 --  CALCIUM 8.3* 8.3* --  MG -- 1.7 1.4*  PHOS -- -- 2.9    Basename 06/30/11 0325  AST 30  ALT 24  ALKPHOS 55  BILITOT 0.4  PROT 6.2  ALBUMIN 3.3*   No results found for this basename: LIPASE:2,AMYLASE:2 in the last 72 hours  Basename 06/30/11 0325  WBC 6.8  NEUTROABS --  HGB 11.8*  HCT 32.1*  MCV 98.2  PLT 293   No results found for this basename: CKTOTAL:3,CKMB:3,CKMBINDEX:3,TROPONINI:3 in the last 72 hours No components found with this basename: POCBNP:3 No results found for this basename: DDIMER:2 in the last 72 hours No results found for this basename: HGBA1C:2 in the last 72 hours No results found for this  basename: CHOL:2,HDL:2,LDLCALC:2,TRIG:2,CHOLHDL:2,LDLDIRECT:2 in the last 72 hours No results found for this basename: TSH,T4TOTAL,FREET3,T3FREE,THYROIDAB in the last 72 hours  Basename 06/29/11 1452  VITAMINB12 1349*  FOLATE >20.0  FERRITIN 134  TIBC 277  IRON 30*  RETICCTPCT --    Micro Results: No results found for this or any previous visit (from the past 240 hour(s)).  Studies/Results: No results found.  Medications: I have reviewed the patient's current medications. Scheduled Meds:    . buPROPion  300 mg Oral Daily  . estrogens (conjugated)  0.625 mg Oral Daily  . famotidine  20 mg Oral BID  . folic acid  1 mg Oral Daily  . magnesium sulfate 1 - 4 g bolus IVPB  3 g Intravenous Once  . magnesium sulfate 1 - 4 g bolus IVPB  2 g Intravenous Once  . medroxyPROGESTERone  2.5 mg Oral Daily  . mulitivitamin with minerals  1 tablet Oral Daily  . nebivolol  30 mg Oral Daily  . nicotine  21 mg Transdermal Daily  . potassium chloride  40 mEq Oral Q4H  . sodium chloride  3 mL Intravenous Q12H  . thiamine  100 mg Oral Daily   Or  . thiamine  100 mg Intravenous Daily  . DISCONTD: nebivolol  20 mg  Oral Daily  . DISCONTD: potassium chloride  40 mEq Oral Once   Continuous Infusions:    . sodium chloride 100 mL/hr at 06/28/11 1301   PRN Meds:.alum & mag hydroxide-simeth, guaiFENesin-codeine, ibuprofen, ondansetron (ZOFRAN) IV, ondansetron, oxyCODONE, simethicone, traMADol  Assessment/Plan: Severe hyponatremia Most likely secondary to "beer drinker's potomania".  Improved today with normal saline and fluid restriction.  TSH normal.  Serum cortisol was appropriate.  Discontinued Lexapro.  Appreciate a renal consultation.  Sodium now at 119 and fluctuating. Urine osmolality and serum osmolality low. Renal ff and appreciate input and rxcs.  Dehydration Continue IV fluids.  Improved.  Mild transaminitis Most likely related to alcohol intake. Recommend close outpatient  followup.  Alcohol abuse Counseled on cessation. On CIWA protocol, suspect patient may need a few more days worth of Ativan/Librium for agitation/anxiety after completing CIWA protocol.  Continue thiamine and folic acid.  Social work offered patient resources on alcohol cessation.  Cough Losartan recently discontinued by primary care physician. No signs or symptoms to suggest infection.  Chest x-ray negative for infectious etiology.  Cigarette smoker Recommended cessation.  Generalized weakness Physical therapy recommending home health physical therapy with rolling walker at discharge.  Fall on 06/28/2011 with left head small hematoma Likely due to generalized weakness.  Patient is neurologically intact on my examination today.  Do neuro checks every 8 hours x2, if not eventful then no further workup or evaluation.  Hypokalemia Replace as needed. Replace magnesium.  Anemia Likely secondary to ETOH abuse. H/H stable. No overt GIB. Check an anemia panel.  Left leg pain below the knee Patient reports having a fall about a week ago, imaging negative for fracture.  Lower extremity Doppler performed on 06/26/2011 of left leg was negative for DVT or SVT, small to moderate sized Baker cyst noted in the popliteal fossa likely incidental finding.  Prophylaxis SCDs  Code Status: Full code  Family Communication: Discussed with daughter and brother at bedside.  Disposition Plan: Pending further improvement in sodium. Anticipate discharge home with home health PT and 5 inch rolling walker.   LOS: 5 days  Mindy Pesola, MD [phone] 06/30/2011, 11:56 AM

## 2011-06-30 NOTE — Progress Notes (Signed)
Dr. Janee Morn aware that Oxycodone makes patient hallucinate.  Ordered to discontinue the Oxycodone

## 2011-06-30 NOTE — Progress Notes (Signed)
Physical Therapy Treatment Patient Details Name: Mindy Young MRN: [id number] DOB: [birth year] Today's Date: 06/30/2011 Time: 0865-7846 PT Time Calculation (min): 23 min  PT Assessment / Plan / Recommendation Comments on Treatment Session  Slowly progressing with ambulation and balance. Still unsteady. Continue to recommend RW and HHPT.    Follow Up Recommendations  Home health PT    Barriers to Discharge        Equipment Recommendations  Rolling walker with 5" wheels    Recommendations for Other Services    Frequency Min 3X/week   Plan Discharge plan remains appropriate    Precautions / Restrictions Precautions Precautions: Fall Restrictions Weight Bearing Restrictions: No   Pertinent Vitals/Pain     Mobility  Bed Mobility Bed Mobility: Supine to Sit;Sit to Supine Supine to Sit: 5: Supervision Sit to Supine: 5: Supervision Transfers Transfers: Sit to Stand;Stand to Sit Sit to Stand: 4: Min guard;From bed Stand to Sit: 4: Min guard;To bed;With upper extremity assist Details for Transfer Assistance: VCs safety.  Ambulation/Gait Ambulation/Gait Assistance: 4: Min guard Ambulation Distance (Feet): 300 Feet Assistive device: None Ambulation/Gait Assistance Details: Very close guarding with ambulation. No LOB but pt still somewhat unsteady.  Gait Pattern: Step-through pattern    Exercises General Exercises - Lower Extremity Hip Flexion/Marching: AROM;Both;10 reps;Standing Heel Raises: AROM;Both;10 reps;Standing Mini-Sqauts: AROM;10 reps;Standing   PT Diagnosis:    PT Problem List:   PT Treatment Interventions:     PT Goals Acute Rehab PT Goals PT Goal: Sit to Stand - Progress: Progressing toward goal PT Goal: Stand to Sit - Progress: Progressing toward goal PT Goal: Ambulate - Progress: Progressing toward goal  Visit Information  Last PT Received On: 06/30/11 Assistance Needed: +1    Subjective Data  Subjective: "My knees are little sore"   Cognition  Overall Cognitive Status: Impaired Area of Impairment: Safety/judgement Arousal/Alertness: Awake/alert Behavior During Session: WFL for tasks performed Safety/Judgement: Decreased awareness of need for assistance    Balance  Balance Balance Assessed: Yes Static Standing Balance Static Standing - Balance Support: Right upper extremity supported Static Standing - Level of Assistance: 5: Stand by assistance Static Standing - Comment/# of Minutes: Stood at sink for standing exercises and SLS-10 second hold each leg x 2 reps with 1 hand support. No LOB. Single Leg Stance - Right Leg: 10  (seconds) Single Leg Stance - Left Leg: 10  (seconds)  End of Session PT - End of Session Equipment Utilized During Treatment: Gait belt Activity Tolerance: Patient tolerated treatment well Patient left: in bed;with call bell/phone within reach;with family/visitor present    Rebeca Alert [facility] 06/30/2011, 11:44 AM [phone]

## 2011-06-30 NOTE — Progress Notes (Signed)
[facility] Progress Note  Subjective:   She complains of a headache - first day she has mentioned to me but daughter says have been frequent - taking tramadol - she thinks it is worse today. Says it used to be in her post occip region but now is "down the middle of my head" Mentally remains clear and is definitely ambulating better Objective Filed Vitals:   06/29/11 1603 06/29/11 2250 06/30/11 0551 06/30/11 1354  BP: 150/92 148/80 157/87 153/87  Pulse: 75 77 80 75  Temp: 97.6 F (36.4 C) 98.1 F (36.7 C) 97.7 F (36.5 C) 98.5 F (36.9 C)  TempSrc: Oral Oral Oral Oral  Resp: 18  18 18   Height:      Weight:      SpO2: 99% 100% 100% 97%  Labs: Basic Metabolic Panel:  Lab 06/30/11 4098 06/30/11 0325 06/29/11 2146 06/29/11 0325  NA 119* 117* 116* --  K 4.4 4.6 4.0 --  CL 88* 84* 83* --  CO2 23 24 23  --  GLUCOSE 121* 101* 118* --  BUN 5* 6 5* --  CREATININE 0.54 0.58 0.58 --  CALCIUM 8.3* 8.3* 8.2* --  ALB -- -- -- --  PHOS -- -- -- 2.9   Liver Function Tests:  Lab 06/30/11 0325 06/26/11 0601 06/25/11 1945  AST 30 65* 76*  ALT 24 33 38*  ALKPHOS 55 48 57  BILITOT 0.4 0.6 0.7  PROT 6.2 5.8* 6.3  ALBUMIN 3.3* 3.3* 3.7    Lab 06/25/11 1945  LIPASE 48  AMYLASE --    Lab 06/25/11 1952  AMMONIA 19   INR: No components found with this basename: INR3 CBC:  Lab 06/30/11 0325 06/27/11 0930 06/25/11 1945  WBC 6.8 7.8 5.9  NEUTROABS -- -- 4.4  HGB 11.8* 10.7* 11.2*  HCT 32.1* 29.0* 30.2*  MCV 98.2 98.6 95.9  PLT 293 246 265     Ref. Range 06/29/2011 15:17  Osmolality Latest Range: 275-300 mOsm/kg 240 (L)     Ref. Range 06/26/2011 05:50 06/28/2011 03:23 06/29/2011 15:16  Osmolality, Ur Latest Range: [phone] mOsm/kg 117 (L) 194 (L) 375 (L)   Studies/Results: Medications:  . sodium chloride 100 mL/hr at 06/30/11 1250   . buPROPion  300 mg Oral Daily  . estrogens (conjugated)  0.625 mg Oral Daily  . famotidine  20 mg Oral BID  . folic acid  1 mg  Oral Daily  . magnesium sulfate 1 - 4 g bolus IVPB  2 g Intravenous Once  . medroxyPROGESTERone  2.5 mg Oral Daily  . mulitivitamin with minerals  1 tablet Oral Daily  . nebivolol  30 mg Oral Daily  . nicotine  21 mg Transdermal Daily  . potassium chloride  40 mEq Oral Q4H  . sodium chloride  3 mL Intravenous Q12H  . thiamine  100 mg Oral Daily   Or  . thiamine  100 mg Intravenous Daily  . DISCONTD: nebivolol  20 mg Oral Daily  . DISCONTD: potassium chloride  40 mEq Oral Once    I  have reviewed scheduled and prn medications.  Assessment/Recommendations  Severe hyponatremia - in setting of excessive alcohol intake, low protein intake.  Initial  urine studies were not consistent with ADH effect - with working diagnosis of beer drinkers potomania.  However repeat Posm and Uosm (see table) are possibly suggestive of some ADH effect and sodium is "stuck in 116-119 range despite several days of normal saline, and she is now complaining of  headaches.   Mental status is clearly better. Her sodium problem has been subacutely worse but she has been hyponatremic for some time (129 Feb 2010) so very slow rate of increase is actually good, although am a bit surprised that she has corrected as slowly as she has and with the repeat urine studies feel we should be sure not dealing with any other process.  TSH and cortisol were normal, and CXR clear.  Would, if you agree, do MRI of brain as part of workup for sources of ADH   Have spoken with Dr. Janee Morn and he agrees 06/30/2011,1:58 PM  LOS: 5 days

## 2011-06-30 NOTE — Progress Notes (Signed)
Patient for MRI of brain.  As per Dr. Janee Morn, patient may travel off telemetry for MRI.

## 2011-07-01 DIAGNOSIS — R51 Headache: Secondary | ICD-10-CM

## 2011-07-01 DIAGNOSIS — E871 Hypo-osmolality and hyponatremia: Secondary | ICD-10-CM

## 2011-07-01 DIAGNOSIS — E860 Dehydration: Secondary | ICD-10-CM

## 2011-07-01 DIAGNOSIS — F1010 Alcohol abuse, uncomplicated: Secondary | ICD-10-CM

## 2011-07-01 LAB — CBC
MCH: 36.7 pg — ABNORMAL HIGH (ref 26.0–34.0)
MCHC: 36.4 g/dL — ABNORMAL HIGH (ref 30.0–36.0)
MCV: 98.5 fL (ref 78.0–100.0)
Platelets: 357 10*3/uL (ref 150–400)
RBC: 3.43 MIL/uL — ABNORMAL LOW (ref 3.87–5.11)
RDW: 12.9 % (ref 11.5–15.5)

## 2011-07-01 LAB — BASIC METABOLIC PANEL
BUN: 6 mg/dL (ref 6–23)
BUN: 7 mg/dL (ref 6–23)
BUN: 8 mg/dL (ref 6–23)
CO2: 21 mEq/L (ref 19–32)
Calcium: 8.4 mg/dL (ref 8.4–10.5)
Calcium: 8.9 mg/dL (ref 8.4–10.5)
Calcium: 8.5 mg/dL (ref 8.4–10.5)
Creatinine, Ser: 0.6 mg/dL (ref 0.50–1.10)
Creatinine, Ser: 0.63 mg/dL (ref 0.50–1.10)
Creatinine, Ser: 0.56 mg/dL (ref 0.50–1.10)
GFR calc Af Amer: >90 mL/min (ref >90)
GFR calc Af Amer: >90 mL/min (ref >90)
GFR calc Af Amer: >90 mL/min (ref >90)
GFR calc non Af Amer: >90 mL/min (ref >90)
GFR calc non Af Amer: >90 mL/min (ref >90)
GFR calc non Af Amer: >90 mL/min (ref >90)
GFR calc non Af Amer: >90 mL/min (ref >90)
Glucose, Bld: 99 mg/dL (ref 70–99)
Glucose, Bld: 120 mg/dL — ABNORMAL HIGH (ref 70–99)
Potassium: 5.8 mEq/L — ABNORMAL HIGH (ref 3.5–5.1)
Sodium: 125 mEq/L — ABNORMAL LOW (ref 135–145)

## 2011-07-01 MED ORDER — METOCLOPRAMIDE HCL 5 MG/ML IJ SOLN
10.0000 mg | Freq: Four times a day (QID) | INTRAMUSCULAR | Status: DC | PRN
  Administered 2011-07-01 – 2011-07-02 (×4): 10 mg via INTRAVENOUS
  Filled 2011-07-01 (×4): qty 2

## 2011-07-01 MED ORDER — MAGNESIUM OXIDE 400 (241.3 MG) MG PO TABS
400.0000 mg | ORAL_TABLET | Freq: Two times a day (BID) | ORAL | Status: DC
  Administered 2011-07-01 – 2011-07-03 (×5): 400 mg via ORAL
  Filled 2011-07-01 (×6): qty 1

## 2011-07-01 MED ORDER — TRAMADOL HCL 50 MG PO TABS: 100.0000 mg | ORAL_TABLET | Freq: Four times a day (QID) | ORAL | Status: DC | PRN

## 2011-07-01 MED ORDER — HYDROCODONE-ACETAMINOPHEN 5-325 MG PO TABS
1.0000 | ORAL_TABLET | ORAL | Status: DC | PRN
  Administered 2011-07-01 – 2011-07-03 (×4): 2 via ORAL
  Filled 2011-07-01 (×4): qty 2

## 2011-07-01 NOTE — Progress Notes (Signed)
[facility] Progress Note  Subjective:  Other than the headache she feels much steadier on her feet, no further confusion or word finding problems Happy that MRI negative Limiting free water (she is in addition to alcohol a habitual water drinker and ice eater) Objective Filed Vitals:   06/30/11 1849 06/30/11 2137 07/01/11 0536 07/01/11 1412  BP: 138/70 134/80 146/82 164/86  Pulse:  77 83 74  Temp:  97.9 F (36.6 C) 97.9 F (36.6 C) 98 F (36.7 C)  TempSrc:  Oral Oral Oral  Resp:  18 18 20   Height:      Weight:      SpO2:  98% 99% 100%   Physical Exam BP 164/86  Pulse 74  Temp(Src) 98 F (36.7 C) (Oral)  Resp 20  Ht 5\' 6"  (1.676 m)  Wt 72.576 kg (160 lb)  BMI 25.82 kg/m2  SpO2 100% General: Ecchymoses about her right eye and on face and arms Heart:S1S2 no S3 Lungs:Clear Abdomen:Soft NT Extremities:Without edema   Additional Objective Labs: Basic Metabolic Panel:  Lab 07/01/11 0981 07/01/11 0320 06/30/11 2155 06/29/11 0325  NA 121* 120* 115* --  K 4.6 4.8 4.0 --  CL 87* 88* 82* --  CO2 21 25 26  --  GLUCOSE 112* 99 99 --  BUN 6 6 7  --  CREATININE 0.63 0.60 0.60 --  CALCIUM 8.9 8.4 8.6 --  ALB -- -- -- --  PHOS -- -- -- 2.9   Liver Function Tests:  Lab 06/30/11 0325 06/26/11 0601 06/25/11 1945  AST 30 65* 76*  ALT 24 33 38*  ALKPHOS 55 48 57  BILITOT 0.4 0.6 0.7  PROT 6.2 5.8* 6.3  ALBUMIN 3.3* 3.3* 3.7   Lab 07/01/11 0320 06/30/11 0325 06/27/11 0930 06/25/11 1945  WBC 7.5 6.8 7.8 --  NEUTROABS -- -- -- 4.4  HGB 12.6 11.8* 10.7* --  HCT 33.8* 32.1* 29.0* --  MCV 98.5 98.2 98.6 95.9  PLT 357 293 246 --    Ref. Range  06/29/2011 15:17   Osmolality  Latest Range: 275-300 mOsm/kg  240 (L)     Ref. Range  06/26/2011 05:50  06/28/2011 03:23  06/29/2011 15:16   Osmolality, Ur  Latest Range: [phone] mOsm/kg  117 (L)  194 (L)  375 (L)     Studies/Results: Mr Lodema Pilot Contrast  06/30/2011  *RADIOLOGY REPORT*  Clinical Data: Confusion and  headache.  History of melanoma.  MRI HEAD WITHOUT AND WITH CONTRAST  Technique:  Multiplanar, multiecho pulse sequences of the brain and surrounding structures were obtained according to standard protocol without and with intravenous contrast  Contrast: 15mL MULTIHANCE GADOBENATE DIMEGLUMINE 529 MG/ML IV SOLN  Comparison: CT head 12/31/2006  Findings: Generalized atrophy.  Negative for hydrocephalus.  Mild chronic microvascular ischemia in the white matter.  No acute infarct.  Negative for intracranial hemorrhage or mass.  No enhancing lesions are seen suggestive of metastatic disease.  The enhancement pattern is normal.  Paranasal sinuses are clear. Degenerative changes C1-C2 with transverse ligament hypertrophy.  IMPRESSION: Atrophy and mild chronic microvascular ischemia.  No acute infarct or mass.  Original Report Authenticated By: Camelia Phenes, M.D.   Medications:  . sodium chloride 100 mL/hr at 07/01/11 0924   . buPROPion  300 mg Oral Daily  . estrogens (conjugated)  0.625 mg Oral Daily  . famotidine  20 mg Oral BID  . folic acid  1 mg Oral Daily  . magnesium oxide  400 mg Oral BID  .  medroxyPROGESTERone  2.5 mg Oral Daily  . metoCLOPramide (REGLAN) injection  10 mg Intravenous Once  . mulitivitamin with minerals  1 tablet Oral Daily  . nebivolol  30 mg Oral Daily  . nicotine  21 mg Transdermal Daily  . sodium chloride  3 mL Intravenous Q12H  . thiamine  100 mg Oral Daily   Assessment/Recommendations  Severe hyponatremia - in setting of excessive alcohol intake, low protein intake. Initial urine studies were not consistent with ADH effect - with working diagnosis of beer drinkers potomania. Repeat Posm and Uosm (see table) are possibly suggestive of some ADH effect however cortisol is normal, TSH normal, CXR negative, MRI of brain negative.   Mental status is clearly better. Her sodium problem has been subacutely worse but she has been hyponatremic for some time (129 Feb 2010) so very  slow rate of increase is actually good, although am a bit surprised that she has corrected as slowly as she has...  Would continue to limit free H2O, start decreasing her NS a bt and see if she remains stable (and hopefully will keep up slow improvement)  07/01/2011,3:30 PM  LOS: 6 days

## 2011-07-01 NOTE — Progress Notes (Signed)
   CARE MANAGEMENT NOTE 07/01/2011  Patient:  Mindy Young, Mindy Young   Account Number:  [id number]  Date Initiated:  06/27/2011  Documentation initiated by:  Raiford Noble  Subjective/Objective Assessment:   pt adm with hyponatremia, etoh withdrawal     Action/Plan:   lives alone-- pscyh consult   Anticipated DC Date:  07/03/2011   Anticipated DC Plan:  HOME/SELF CARE  In-house referral  Clinical Social Worker      DC Planning Services  NA      [facility] Choice  NA   Choice offered to / List presented to:  NA   DME arranged  NA      DME agency  NA     HH arranged  NA      HH agency  [facility]   Status of service:  In process, will continue to follow Medicare Important Message given?   (If response is "NO", the following Medicare IM given date fields will be blank) Date Medicare IM given:   Date Additional Medicare IM given:    Discharge Disposition:  HOME/SELF CARE  Per UR Regulation:  Reviewed for med. necessity/level of care/duration of stay  If discussed at Long Length of Stay Meetings, dates discussed:    Comments:  07-01-11 Mindy Young [phone] Family chose [facility] in case HHC PT is still needed upon dc. Made Mindy Young with [facility] aware in case patient is agreeable to [facility] over the weekend if she discharges.     06-27-11 Raiford Noble, RN,BSN,CM [phone]

## 2011-07-01 NOTE — Progress Notes (Signed)
Subjective: Patient is more alert and conversant and improving clinically per family. Patient ambulating in hallway. Patient states cramps in legs improving . Patient states still with  HA.  Objective: Vital signs in last 24 hours: Filed Vitals:   06/30/11 1849 06/30/11 2137 07/01/11 0536 07/01/11 1412  BP: 138/70 134/80 146/82 164/86  Pulse:  77 83 74  Temp:  97.9 F (36.6 C) 97.9 F (36.6 C) 98 F (36.7 C)  TempSrc:  Oral Oral Oral  Resp:  18 18 20   Height:      Weight:      SpO2:  98% 99% 100%   Weight change:   Intake/Output Summary (Last 24 hours) at 07/01/11 1539 Last data filed at 07/01/11 0815  Gross per 24 hour  Intake   2740 ml  Output    900 ml  Net   1840 ml    Physical Exam: General: Awake, Oriented, No acute distress, tremulous at times. HEENT: EOMI, small hematoma noted over left side of head in the scalp. Neck: Supple CV: S1 and S2 Lungs: Clear to ascultation bilaterally Abdomen: Soft, Nontender, Nondistended, +bowel sounds. Ext: Good pulses. Trace edema. Neuro: Cranial nerves II-XII grossly intact.  Lab Results:  Basename 07/01/11 0950 07/01/11 0320 06/30/11 0325 06/29/11 0325  NA 121* 120* -- --  K 4.6 4.8 -- --  CL 87* 88* -- --  CO2 21 25 -- --  GLUCOSE 112* 99 -- --  BUN 6 6 -- --  CREATININE 0.63 0.60 -- --  CALCIUM 8.9 8.4 -- --  MG -- 1.9 1.7 --  PHOS -- -- -- 2.9    Basename 06/30/11 0325  AST 30  ALT 24  ALKPHOS 55  BILITOT 0.4  PROT 6.2  ALBUMIN 3.3*   No results found for this basename: LIPASE:2,AMYLASE:2 in the last 72 hours  Basename 07/01/11 0320 06/30/11 0325  WBC 7.5 6.8  NEUTROABS -- --  HGB 12.6 11.8*  HCT 33.8* 32.1*  MCV 98.5 98.2  PLT 357 293   No results found for this basename: CKTOTAL:3,CKMB:3,CKMBINDEX:3,TROPONINI:3 in the last 72 hours No components found with this basename: POCBNP:3 No results found for this basename: DDIMER:2 in the last 72 hours No results found for this basename: HGBA1C:2 in the  last 72 hours No results found for this basename: CHOL:2,HDL:2,LDLCALC:2,TRIG:2,CHOLHDL:2,LDLDIRECT:2 in the last 72 hours No results found for this basename: TSH,T4TOTAL,FREET3,T3FREE,THYROIDAB in the last 72 hours  Basename 06/29/11 1452  VITAMINB12 1349*  FOLATE >20.0  FERRITIN 134  TIBC 277  IRON 30*  RETICCTPCT --    Micro Results: No results found for this or any previous visit (from the past 240 hour(s)).  Studies/Results: Mr Laqueta Jean RU Contrast  06/30/2011  *RADIOLOGY REPORT*  Clinical Data: Confusion and headache.  History of melanoma.  MRI HEAD WITHOUT AND WITH CONTRAST  Technique:  Multiplanar, multiecho pulse sequences of the brain and surrounding structures were obtained according to standard protocol without and with intravenous contrast  Contrast: 15mL MULTIHANCE GADOBENATE DIMEGLUMINE 529 MG/ML IV SOLN  Comparison: CT head 12/31/2006  Findings: Generalized atrophy.  Negative for hydrocephalus.  Mild chronic microvascular ischemia in the white matter.  No acute infarct.  Negative for intracranial hemorrhage or mass.  No enhancing lesions are seen suggestive of metastatic disease.  The enhancement pattern is normal.  Paranasal sinuses are clear. Degenerative changes C1-C2 with transverse ligament hypertrophy.  IMPRESSION: Atrophy and mild chronic microvascular ischemia.  No acute infarct or mass.  Original Report Authenticated By:  Camelia Phenes, M.D.    Medications: I have reviewed the patient's current medications. Scheduled Meds:    . buPROPion  300 mg Oral Daily  . estrogens (conjugated)  0.625 mg Oral Daily  . famotidine  20 mg Oral BID  . folic acid  1 mg Oral Daily  . magnesium oxide  400 mg Oral BID  . medroxyPROGESTERone  2.5 mg Oral Daily  . metoCLOPramide (REGLAN) injection  10 mg Intravenous Once  . mulitivitamin with minerals  1 tablet Oral Daily  . nebivolol  30 mg Oral Daily  . nicotine  21 mg Transdermal Daily  . sodium chloride  3 mL Intravenous Q12H    . thiamine  100 mg Oral Daily   Continuous Infusions:    . sodium chloride 100 mL/hr at 07/01/11 0924   PRN Meds:.alum & mag hydroxide-simeth, gadobenate dimeglumine, guaiFENesin-codeine, HYDROcodone-acetaminophen, ibuprofen, ondansetron (ZOFRAN) IV, ondansetron, simethicone, traMADol, DISCONTD: oxyCODONE, DISCONTD: traMADol  Assessment/Plan: Severe hyponatremia Most likely secondary to "beer drinker's potomania".  Improved today with normal saline and fluid restriction.  TSH normal.  Serum cortisol was appropriate.  Discontinued Lexapro.  Appreciate a renal consultation.  Sodium now at 121 and fluctuating. Urine osmolality and serum osmolality low.  MRI neg. Renal ff and appreciate input and rxcs.  Dehydration Continue IV fluids.  Improved.  Mild transaminitis Most likely related to alcohol intake. Recommend close outpatient followup.  Alcohol abuse Counseled on cessation. On CIWA protocol, suspect patient may need a few more days worth of Ativan/Librium for agitation/anxiety after completing CIWA protocol.  Continue thiamine and folic acid.  Social work offered patient resources on alcohol cessation.  Cough Losartan recently discontinued by primary care physician. No signs or symptoms to suggest infection.  Chest x-ray negative for infectious etiology.  Cigarette smoker Recommended cessation.  Generalized weakness Physical therapy recommending home health physical therapy with rolling walker at discharge.  Fall on 06/28/2011 with left head small hematoma Likely due to generalized weakness.  Patient is neurologically intact on my examination today.  Do neuro checks every 8 hours x2, if not eventful then no further workup or evaluation.  Hypokalemia Replace as needed. Replace magnesium.  Anemia Likely secondary to ETOH abuse. H/H stable. No overt GIB. Check an anemia panel.  Left leg pain below the knee Patient reports having a fall about a week ago, imaging negative for  fracture.  Lower extremity Doppler performed on 06/26/2011 of left leg was negative for DVT or SVT, small to moderate sized Baker cyst noted in the popliteal fossa likely incidental finding.  HA Will increase ultram to 100mg  PRN. Will try warm compressors to neck. Reglan prn.  Prophylaxis SCDs  Code Status: Full code  Family Communication: Discussed with daughter and brother at bedside.  Disposition Plan: Pending further improvement in sodium. Anticipate discharge home with home health PT and 5 inch rolling walker.   LOS: 6 days  [facility], MD [phone] 07/01/2011, 3:39 PM

## 2011-07-02 DIAGNOSIS — E871 Hypo-osmolality and hyponatremia: Secondary | ICD-10-CM

## 2011-07-02 DIAGNOSIS — E860 Dehydration: Secondary | ICD-10-CM

## 2011-07-02 DIAGNOSIS — F1010 Alcohol abuse, uncomplicated: Secondary | ICD-10-CM

## 2011-07-02 DIAGNOSIS — R51 Headache: Secondary | ICD-10-CM

## 2011-07-02 LAB — BASIC METABOLIC PANEL
Calcium: 9.2 mg/dL (ref 8.4–10.5)
Calcium: 9 mg/dL (ref 8.4–10.5)
Chloride: 89 mEq/L — ABNORMAL LOW (ref 96–112)
Creatinine, Ser: 0.64 mg/dL (ref 0.50–1.10)
Creatinine, Ser: 0.55 mg/dL (ref 0.50–1.10)
GFR calc Af Amer: >90 mL/min (ref >90)
GFR calc Af Amer: >90 mL/min (ref >90)
GFR calc Af Amer: >90 mL/min (ref >90)
GFR calc Af Amer: >90 mL/min (ref >90)
GFR calc non Af Amer: >90 mL/min (ref >90)
GFR calc non Af Amer: >90 mL/min (ref >90)
Potassium: 4 mEq/L (ref 3.5–5.1)
Potassium: 4.1 mEq/L (ref 3.5–5.1)
Sodium: 126 mEq/L — ABNORMAL LOW (ref 135–145)
Sodium: 126 mEq/L — ABNORMAL LOW (ref 135–145)
Sodium: 124 mEq/L — ABNORMAL LOW (ref 135–145)
Sodium: 123 mEq/L — ABNORMAL LOW (ref 135–145)

## 2011-07-02 MED ORDER — AMLODIPINE BESYLATE 5 MG PO TABS
5.0000 mg | ORAL_TABLET | Freq: Every day | ORAL | Status: DC
  Administered 2011-07-02 – 2011-07-03 (×2): 5 mg via ORAL
  Filled 2011-07-02 (×2): qty 1

## 2011-07-02 MED ORDER — NEBIVOLOL HCL 10 MG PO TABS
40.0000 mg | ORAL_TABLET | Freq: Every day | ORAL | Status: DC
  Administered 2011-07-02 – 2011-07-03 (×2): 40 mg via ORAL
  Filled 2011-07-02 (×2): qty 4

## 2011-07-02 NOTE — Progress Notes (Signed)
Subjective: Patient is more alert and conversant and improving clinically per family. Patient ambulating in hallway. Patient states cramps in legs improving . Patient states  HA improved on Vicodin..  Objective: Vital signs in last 24 hours: Filed Vitals:   07/01/11 2205 07/02/11 0625 07/02/11 0706 07/02/11 1404  BP: 165/88 167/100 168/100 174/94  Pulse: 78 73 81 83  Temp: 97.7 F (36.5 C) 97.7 F (36.5 C) 98.2 F (36.8 C) 98 F (36.7 C)  TempSrc: Oral Oral Oral Oral  Resp: 18 18 18 18   Height:      Weight:      SpO2: 98% 98% 98% 98%   Weight change:   Intake/Output Summary (Last 24 hours) at 07/02/11 1614 Last data filed at 07/01/11 1700  Gross per 24 hour  Intake      0 ml  Output    400 ml  Net   -400 ml    Physical Exam: General: Awake, Oriented, No acute distress, tremulous at times. HEENT: EOMI, small hematoma noted over left side of head in the scalp. Neck: Supple CV: S1 and S2 Lungs: Clear to ascultation bilaterally Abdomen: Soft, Nontender, Nondistended, +bowel sounds. Ext: Good pulses. Trace edema. Neuro: Cranial nerves II-XII grossly intact.  Lab Results:  Basename 07/02/11 0857 07/02/11 0305 07/01/11 0320 06/30/11 0325  NA 126* 126* -- --  K 5.1 4.2 -- --  CL 92* 90* -- --  CO2 21 26 -- --  GLUCOSE 104* 99 -- --  BUN 7 7 -- --  CREATININE 0.55 0.64 -- --  CALCIUM 9.0 9.2 -- --  MG -- -- 1.9 1.7  PHOS -- -- -- --    Basename 06/30/11 0325  AST 30  ALT 24  ALKPHOS 55  BILITOT 0.4  PROT 6.2  ALBUMIN 3.3*   No results found for this basename: LIPASE:2,AMYLASE:2 in the last 72 hours  Basename 07/01/11 0320 06/30/11 0325  WBC 7.5 6.8  NEUTROABS -- --  HGB 12.6 11.8*  HCT 33.8* 32.1*  MCV 98.5 98.2  PLT 357 293   No results found for this basename: CKTOTAL:3,CKMB:3,CKMBINDEX:3,TROPONINI:3 in the last 72 hours No components found with this basename: POCBNP:3 No results found for this basename: DDIMER:2 in the last 72 hours No results  found for this basename: HGBA1C:2 in the last 72 hours No results found for this basename: CHOL:2,HDL:2,LDLCALC:2,TRIG:2,CHOLHDL:2,LDLDIRECT:2 in the last 72 hours No results found for this basename: TSH,T4TOTAL,FREET3,T3FREE,THYROIDAB in the last 72 hours No results found for this basename: VITAMINB12:2,FOLATE:2,FERRITIN:2,TIBC:2,IRON:2,RETICCTPCT:2 in the last 72 hours  Micro Results: No results found for this or any previous visit (from the past 240 hour(s)).  Studies/Results: No results found.  Medications: I have reviewed the patient's current medications. Scheduled Meds:    . buPROPion  300 mg Oral Daily  . estrogens (conjugated)  0.625 mg Oral Daily  . famotidine  20 mg Oral BID  . folic acid  1 mg Oral Daily  . magnesium oxide  400 mg Oral BID  . medroxyPROGESTERone  2.5 mg Oral Daily  . mulitivitamin with minerals  1 tablet Oral Daily  . nebivolol  40 mg Oral Daily  . nicotine  21 mg Transdermal Daily  . sodium chloride  3 mL Intravenous Q12H  . thiamine  100 mg Oral Daily  . DISCONTD: nebivolol  30 mg Oral Daily   Continuous Infusions:    . 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  Improved today with normal saline and fluid restriction.  TSH normal.  Serum cortisol was appropriate.  Discontinued Lexapro.  Appreciate a renal consultation.  Sodium now at 126.  MRI neg. continue with fluid restriction and gentle hydration. Renal ff and appreciate input and rxcs.  Dehydration Continue IV fluids.  Improved.  Mild transaminitis Most likely related to alcohol intake. Recommend close outpatient followup.  Alcohol abuse Counseled on cessation. On CIWA  protocol, suspect patient may need a few more days worth of Ativan/Librium for agitation/anxiety after completing CIWA protocol.  Continue thiamine and folic acid.  Social work offered patient resources on alcohol cessation.  Cough Losartan recently discontinued by primary care physician. No signs or symptoms to suggest infection.  Chest x-ray negative for infectious etiology.  Cigarette smoker Recommended cessation.  Generalized weakness Physical therapy recommending home health physical therapy with rolling walker at discharge.  Fall on 06/28/2011 with left head small hematoma Likely due to generalized weakness.  Patient is neurologically intact on my examination today.  Do neuro checks every 8 hours x2, if not eventful then no further workup or evaluation.  Hypokalemia Replace as needed. Replace magnesium.  Anemia Likely secondary to ETOH abuse. H/H stable. No overt GIB.   Hypertension Patient's dose of bysystolic has been increased to 40 mg daily. Will add Norvasc 5 mg daily and follow.  Left leg pain below the knee Patient reports having a fall about a week ago, imaging negative for fracture.  Lower extremity Doppler performed on 06/26/2011 of left leg was negative for DVT or SVT, small to moderate sized Baker cyst noted in the popliteal fossa likely incidental finding.  HA Improved with Vicodin per patient. Continue Vicodin as needed and warm compresses to the neck.    Prophylaxis SCDs  Code Status: Full code  Family Communication: Discussed with daughter and brother at bedside.  Disposition Plan: Pending further improvement in sodium. Anticipate discharge home with home health PT and 5 inch rolling walker.   LOS: 7 days  [facility], MD [phone] 07/02/2011, 4:14 PM

## 2011-07-02 NOTE — Progress Notes (Signed)
PT Cancellation Note  Treatment cancelled today due to medical issues with patient which prohibited therapy.  Pts family states that she got sick earlier in the afternoon, therefore deferred therapy for today.  Will check back as schedule permits  Thanks,   Lessie Dings 07/02/2011, 4:51 PM

## 2011-07-02 NOTE — Progress Notes (Signed)
Patient ID: Mindy Young, female   DOB: 03/03/1945, 66 y.o.   MRN: [id number]   [facility] Progress Note    Subjective:   The patient reports to be feeling better however still has occasional headaches and unsteadiness on her feet. She denies any nausea or vomiting and is able to keep up with fluid restrictions.    Objective:   BP 168/100  Pulse 81  Temp(Src) 98.2 F (36.8 C) (Oral)  Resp 18  Ht 5\' 6"  (1.676 m)  Wt 72.576 kg (160 lb)  BMI 25.82 kg/m2  SpO2 98%  Intake/Output Summary (Last 24 hours) at 07/02/11 1137 Last data filed at 07/01/11 1700  Gross per 24 hour  Intake    240 ml  Output    900 ml  Net   -660 ml   Weight change:   Physical Exam: Gen: Comfortably sitting out of bed in her recliner. Bruising noted over face/head CVS: Pulse regular in rate and rhythm, heart sounds S1 and S2 are normal without any obvious murmurs rubs or gallops Resp: Both lung fields are clear to auscultation-no rales/retractions or rhonchi Abd: Soft, flat, nontender and bowel sounds are normal Ext: No edema appreciated over either lower extremity  Imaging: Mr Laqueta Jean Wo Contrast  06/30/2011  *RADIOLOGY REPORT*  Clinical Data: Confusion and headache.  History of melanoma.  MRI HEAD WITHOUT AND WITH CONTRAST  Technique:  Multiplanar, multiecho pulse sequences of the brain and surrounding structures were obtained according to standard protocol without and with intravenous contrast  Contrast: 15mL MULTIHANCE GADOBENATE DIMEGLUMINE 529 MG/ML IV SOLN  Comparison: CT head 12/31/2006  Findings: Generalized atrophy.  Negative for hydrocephalus.  Mild chronic microvascular ischemia in the white matter.  No acute infarct.  Negative for intracranial hemorrhage or mass.  No enhancing lesions are seen suggestive of metastatic disease.  The enhancement pattern is normal.  Paranasal sinuses are clear. Degenerative changes C1-C2 with transverse ligament hypertrophy.  IMPRESSION: Atrophy and mild  chronic microvascular ischemia.  No acute infarct or mass.  Original Report Authenticated By: Camelia Phenes, M.D.    Labs: BMET  Lab 07/02/11 4098 07/02/11 0305 07/01/11 2150 07/01/11 1505 07/01/11 0950 07/01/11 0320 06/30/11 2155 06/29/11 0325  NA 126* 126* 122* 125* 121* 120* 115* --  K 5.1 4.2 5.8* 4.3 4.6 4.8 4.0 --  CL 92* 90* 89* 90* 87* 88* 82* --  CO2 21 26 21 25 21 25 26  --  GLUCOSE 104* 99 103* 120* 112* 99 99 --  BUN 7 7 8 7 6 6 7  --  CREATININE 0.55 0.64 0.56 0.62 0.63 0.60 0.60 --  ALB -- -- -- -- -- -- -- --  CALCIUM 9.0 9.2 8.8 8.5 8.9 8.4 8.6 --  PHOS -- -- -- -- -- -- -- 2.9   CBC  Lab 07/01/11 0320 06/30/11 0325 06/27/11 0930 06/25/11 1945  WBC 7.5 6.8 7.8 5.9  NEUTROABS -- -- -- 4.4  HGB 12.6 11.8* 10.7* 11.2*  HCT 33.8* 32.1* 29.0* 30.2*  MCV 98.5 98.2 98.6 95.9  PLT 357 293 246 265    Medications:      . buPROPion  300 mg Oral Daily  . estrogens (conjugated)  0.625 mg Oral Daily  . famotidine  20 mg Oral BID  . folic acid  1 mg Oral Daily  . magnesium oxide  400 mg Oral BID  . medroxyPROGESTERone  2.5 mg Oral Daily  . mulitivitamin with minerals  1 tablet Oral Daily  .  nebivolol  40 mg Oral Daily  . nicotine  21 mg Transdermal Daily  . sodium chloride  3 mL Intravenous Q12H  . thiamine  100 mg Oral Daily  . DISCONTD: nebivolol  30 mg Oral Daily     Assessment/ Plan:   1.Severe hyponatremia - suspected to be a combination of beer drinkers potomania and syndrome of inappropriate antidiuretic hormone based on consequent labs. Continue to restrict her oral fluid intake given the beneficial effects on the current sodium level and consider discharge if remains stable. Will need close followup as an outpatient upon discharge to follow sodium closely. Mental status is clearly better according to previous notes reviewed. Reeducated the patient regarding abstinence from alcohol. No indications noted for diuretics at this time and if sodium remains  challenging to correct in the future, would consider demeclocycline. 2. Hyperkalemia: Mild and likely secondary to over aggressive correction/liberal fruit juice intake with decreased total water intake-i.e. impaired renal potassium handling, continue to monitor closely. 3. Hypomagnesemia: Currently replete.  Mindy Bills, MD 07/02/2011, 11:37 AM

## 2011-07-03 DIAGNOSIS — E871 Hypo-osmolality and hyponatremia: Secondary | ICD-10-CM

## 2011-07-03 DIAGNOSIS — E860 Dehydration: Secondary | ICD-10-CM

## 2011-07-03 DIAGNOSIS — R51 Headache: Secondary | ICD-10-CM

## 2011-07-03 DIAGNOSIS — F1010 Alcohol abuse, uncomplicated: Secondary | ICD-10-CM

## 2011-07-03 LAB — BASIC METABOLIC PANEL
BUN: 11 mg/dL (ref 6–23)
CO2: 26 mEq/L (ref 19–32)
Calcium: 9.1 mg/dL (ref 8.4–10.5)
Chloride: 91 mEq/L — ABNORMAL LOW (ref 96–112)
Chloride: 91 mEq/L — ABNORMAL LOW (ref 96–112)
GFR calc Af Amer: >90 mL/min (ref >90)
GFR calc non Af Amer: 90 mL/min — ABNORMAL LOW (ref >90)
Glucose, Bld: 113 mg/dL — ABNORMAL HIGH (ref 70–99)
Potassium: 4.2 mEq/L (ref 3.5–5.1)
Potassium: 4 mEq/L (ref 3.5–5.1)
Sodium: 127 mEq/L — ABNORMAL LOW (ref 135–145)
Sodium: 127 mEq/L — ABNORMAL LOW (ref 135–145)

## 2011-07-03 MED ORDER — NEBIVOLOL HCL 20 MG PO TABS: 40.0000 mg | ORAL_TABLET | Freq: Every day | ORAL | Status: DC

## 2011-07-03 MED ORDER — AMLODIPINE BESYLATE 5 MG PO TABS: 5.0000 mg | ORAL_TABLET | Freq: Every day | ORAL | Status: DC

## 2011-07-03 MED ORDER — THIAMINE HCL 100 MG PO TABS: 100.0000 mg | ORAL_TABLET | Freq: Every day | ORAL | Status: AC

## 2011-07-03 MED ORDER — NICOTINE 21 MG/24HR TD PT24: 1.0000 | MEDICATED_PATCH | Freq: Every day | TRANSDERMAL | Status: AC

## 2011-07-03 MED ORDER — FOLIC ACID 1 MG PO TABS: 1.0000 mg | ORAL_TABLET | Freq: Every day | ORAL | Status: AC

## 2011-07-03 NOTE — Progress Notes (Signed)
DC instructions gone over with patient and son in law.  Stressed to patient that she must drink no alcohol and keep her fluids down to 1200 ccs per day.  Patient voiced understanding.  Walked down to car to be taken home by daughter and son in Social worker.

## 2011-07-03 NOTE — Progress Notes (Signed)
Patient ID: Mindy Young, female   DOB: 28-Nov-1945, 66 y.o.   MRN: [id number]   [facility] Progress Note    Subjective:   Reports to be feeling well with improved balance and no emergent complaints.    Objective:   BP 153/98  Pulse 85  Temp(Src) 98 F (36.7 C) (Oral)  Resp 18  Ht 5\' 6"  (1.676 m)  Wt 72.576 kg (160 lb)  BMI 25.82 kg/m2  SpO2 95%  Intake/Output Summary (Last 24 hours) at 07/03/11 1206 Last data filed at 07/03/11 0957  Gross per 24 hour  Intake    906 ml  Output    800 ml  Net    106 ml   Weight change:   Physical Exam: Gen: Comfortable ambulating around room CVS: Pulse regular in rate and rhythm, heart sounds S1 and S2 are normal Resp: Clear to auscultation bilaterally, no rales/rhonchi Abd: Soft, obese, nontender and bowel sounds are normal Ext: No edema over either lower extremity  Imaging: No results found.  Labs: BMET  Lab 07/03/11 1005 07/03/11 0333 07/02/11 2113 07/02/11 1515 07/02/11 0857 07/02/11 0305 07/01/11 2150 06/29/11 0325  NA 127* 127* 123* 124* 126* 126* 122* --  K 4.2 4.1 4.1 4.0 5.1 4.2 5.8* --  CL 90* 91* 89* 88* 92* 90* 89* --  CO2 26 26 23 24 21 26 21  --  GLUCOSE 113* 111* 115* 145* 104* 99 103* --  BUN 8 6 6 7 7 7 8  --  CREATININE 0.67 0.58 0.55 0.63 0.55 0.64 0.56 --  ALB -- -- -- -- -- -- -- --  CALCIUM 9.3 9.1 8.6 9.2 9.0 9.2 8.8 --  PHOS -- -- -- -- -- -- -- 2.9   CBC  Lab 07/01/11 0320 06/30/11 0325 06/27/11 0930  WBC 7.5 6.8 7.8  NEUTROABS -- -- --  HGB 12.6 11.8* 10.7*  HCT 33.8* 32.1* 29.0*  MCV 98.5 98.2 98.6  PLT 357 293 246    Medications:      . amLODipine  5 mg Oral Daily  . buPROPion  300 mg Oral Daily  . estrogens (conjugated)  0.625 mg Oral Daily  . famotidine  20 mg Oral BID  . folic acid  1 mg Oral Daily  . magnesium oxide  400 mg Oral BID  . medroxyPROGESTERone  2.5 mg Oral Daily  . mulitivitamin with minerals  1 tablet Oral Daily  . nebivolol  40 mg Oral Daily  .  nicotine  21 mg Transdermal Daily  . sodium chloride  3 mL Intravenous Q12H  . thiamine  100 mg Oral Daily     Assessment/ Plan:   1.Hyponatremia - suspected to be a combination of beer potomania and syndrome of inappropriate antidiuretic hormone based on consequent labs.Mental status is clearly better according to previous notes reviewed. Reeducated the patient regarding abstinence from alcohol and fluid restriction. At her current sodium level and improved neurological status, it would be safe to discharge the patient with weekly labs to be done at her primary care provider's office to monitor the trend of sodium. I reiterated the need for fluid restriction/alcohol abstinence to her yet again.  2. Hypomagnesemia: Currently replete. 3. Recent history of alcohol dependency: Reeducated on abstinence.   Zetta Bills, MD 07/03/2011, 12:06 PM

## 2011-07-03 NOTE — Discharge Summary (Signed)
Discharge Summary  NAVY ROTHSCHILD MR#: [id number]  DOB:1945/08/07  Date of Admission: 06/25/2011 Date of Discharge: 07/03/2011  Patient's PCP: REDMON,NOELLE, PA, PA  Attending Physician:Iokepa Geffre  Consults: Treatment Team:  #1: Nephrology: Sadie Haber, MD   Discharge Diagnoses: Hyponatremia Present on Admission:  .Hyponatremia .Transaminitis .Alcohol abuse .Cough .Cigarette smoker .Hypertension .Anemia   Brief Admitting History and Physical 66 year old woman presented to the emergency department for complaint of two-week history of cough. This is her primary concern. She was recently seen by her primary care provider and losartan was discontinued as it was a possible etiology for her cough. She has several other complaints including a few episodes of post-tussive vomiting, some shortness of breath worse on exertion and poor solid intake. No fever, chills. She notes tremors in her legs and gait instability. She has a history of falls in the last several weeks. She has a history of drinking at least 3-4 liquor drinks per day.  In the emergency department she was noted be afebrile and vital signs were stable. Chemistry panel was notable for sodium of 114, chloride 78 BUN 5. Reticulocyte function panel notable for mild elevation of AST and ALT. CBC is essentially unremarkable. Urinalysis and alcohol level were negative. Chest x-ray is pending at this time.  She was given several doses of lorazepam and admission was requested for marked hyponatremia and potential for alcohol withdrawal.  Review of Systems:  Negative for fever, changes to her vision, sore throat, rash, chest pain, dysuria, bleeding. Otherwise as above For the rest of admission history and physical please see H&P dictated by Dr. Irene Limbo.  Discharge Medications Medication List  As of 07/03/2011  4:19 PM   START taking these medications         amLODipine 5 MG tablet   Commonly known as: NORVASC   Take 1 tablet  (5 mg total) by mouth daily.      folic acid 1 MG tablet   Commonly known as: FOLVITE   Take 1 tablet (1 mg total) by mouth daily.      nicotine 21 mg/24hr patch   Commonly known as: NICODERM CQ - dosed in mg/24 hours   Place 1 patch onto the skin daily.      thiamine 100 MG tablet   Take 1 tablet (100 mg total) by mouth daily.         CHANGE how you take these medications         Nebivolol HCl 20 MG Tabs   Take 2 tablets (40 mg total) by mouth daily.   What changed: - dose - how often to take the med         CONTINUE taking these medications         ALPRAZolam 0.5 MG tablet   Commonly known as: XANAX      buPROPion 300 MG 24 hr tablet   Commonly known as: WELLBUTRIN XL      CELEBREX 200 MG capsule   Generic drug: celecoxib      cimetidine 400 MG tablet   Commonly known as: TAGAMET      escitalopram 20 MG tablet   Commonly known as: LEXAPRO      MULTIVITAMIN PO      PREMPRO 0.625-2.5 MG per tablet   Generic drug: estrogen (conjugated)-medroxyprogesterone          Where to get your medications    These are the prescriptions that you need to pick up.   You may get these  medications from any pharmacy.         amLODipine 5 MG tablet   folic acid 1 MG tablet   Nebivolol HCl 20 MG Tabs   nicotine 21 mg/24hr patch   thiamine 100 MG tablet           Hospital Course: #1 severe Hyponatremia Patient was admitted with severe hyponatremia with a sodium of 114. Initial working diagnosis was felt to be secondary to be a drink as per the mania based on patient's history of alcohol abuse in the lab studies. Patient was hydrated with IV fluids and monitored and serial BMETs were obtained. TSH levels and cortisol levels were checked which were within normal limits. Magnesium levels which also checked and were low. Urine osmolality and serum osmolality levels were initially obtained. A urine sodium level which was obtained came back at 44. Initial urine osmolality came  back low at 117. Initial serum was bladder he came back at 245 and then went down to 234. Chest x-ray which was obtained was negative. A nephrology consultation was obtained and patient was seen in consultation by Dr. Eliott Nine  06/27/2011. It was felt at that time that patient's severe hyponatremia were likely secondary to beer drink is put her mania. Patient's urine studies and workup were not initially indicated of SIADH. Patient was hydrated gently with IV fluids and placed under 1200 cc fluid restriction. Patient was monitored. Serial B. meds were obtained and patient was followed. Patient's sodium levels initially fluctuated and were rising very slowly. Patient improved clinically became less lethargic and more alert oriented and coherent as her sodium levels improved slowly. Repeat serum osmolalities and urine osmolalities were done and repeat studies were suggestive of the ADH effect. Patient's cortisol levels were normal. Chest x-ray which was done was negative. MRI of the head was also obtained as patient did have some complaints of a headache which was negative. Patient was maintained on gentle hydration with fluid restriction. 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 On day of discharge patient's serum sodium levels off to 127. Patient had improved clinically. Patient was reeducated on abstinence from alcohol and fluid restriction by both the nephrologist and the primary team. Patient is to keep an appointment with her PCP as scheduled on 07/04/2011. It is recommended that patient get weekly basic metabolic profiles times one month and continue with a 1200 cc fluid restriction. Patient be discharged in stable and improved condition.  #2 transaminitis On admission patient did have a mildly elevated  LFTs. This was felt to be likely secondary to alcohol intake. Repeat LFTs which were obtained showed normalization of LFTs. Patient to followup with her PCP as outpatient for further workup if needed.  #3 alcohol abuse During the hospitalization patient was maintained on the CIWA protocol. And patient was counseled on alcohol cessation. Patient was maintained on thiamine and folic acid during the hospitalization were discharged home on this. Patient was offered resources by Child psychotherapist on alcohol cessation.  #4 hypertension During the hospitalization patient was noted to be hypertensive. Patient's ARB had been discontinued secondary to possible side effects of cough. Patient's bysystolic was increased up to 40 mg daily. Norvasc 5 mg daily was added to patient's regimen. She will followup with her PCP as outpatient for further management of her hypertension.    Present on Admission:  .Hyponatremia .  Transaminitis .Alcohol abuse .Cough .Cigarette smoker .Hypertension .Anemia   Day of Discharge BP 148/94  Pulse 82  Temp(Src) 97.8 F (36.6 C) (Oral)  Resp 18  Ht 5\' 6"  (1.676 m)  Wt 72.576 kg (160 lb)  BMI 25.82 kg/m2  SpO2 98% Subjective: No complaints. Patient denies any headaches. Patient is ready to go home. General: Alert, awake, oriented x3, in no acute distress. HEENT: No bruits, no goiter. Heart: Regular rate and rhythm, without murmurs, rubs, gallops. Lungs: Clear to auscultation bilaterally. Abdomen: Soft, nontender, nondistended, positive bowel sounds. Extremities: No clubbing cyanosis or edema with positive pedal pulses. Neuro: Grossly intact, nonfocal.   Results for orders placed during the hospital encounter of 06/25/11 (from the past 48 hour(s))  BASIC METABOLIC PANEL     Status: Abnormal   Collection Time   07/01/11  9:50 PM      Component Value Range Comment   Sodium 122 (*) 135 - 145 (mEq/L)    Potassium 5.8 (*) 3.5 - 5.1 (mEq/L)    Chloride 89 (*) 96 - 112  (mEq/L)    CO2 21  19 - 32 (mEq/L)    Glucose, Bld 103 (*) 70 - 99 (mg/dL)    BUN 8  6 - 23 (mg/dL)    Creatinine, Ser 1.61  0.50 - 1.10 (mg/dL)    Calcium 8.8  8.4 - 10.5 (mg/dL)    GFR calc non Af Amer >90  >90 (mL/min)    GFR calc Af Amer >90  >90 (mL/min)   BASIC METABOLIC PANEL     Status: Abnormal   Collection Time   07/02/11  3:05 AM      Component Value Range Comment   Sodium 126 (*) 135 - 145 (mEq/L)    Potassium 4.2  3.5 - 5.1 (mEq/L) DELTA CHECK NOTED   Chloride 90 (*) 96 - 112 (mEq/L)    CO2 26  19 - 32 (mEq/L)    Glucose, Bld 99  70 - 99 (mg/dL)    BUN 7  6 - 23 (mg/dL)    Creatinine, Ser 0.96  0.50 - 1.10 (mg/dL)    Calcium 9.2  8.4 - 10.5 (mg/dL)    GFR calc non Af Amer >90  >90 (mL/min)    GFR calc Af Amer >90  >90 (mL/min)   BASIC METABOLIC PANEL     Status: Abnormal   Collection Time   07/02/11  8:57 AM      Component Value Range Comment   Sodium 126 (*) 135 - 145 (mEq/L)    Potassium 5.1  3.5 - 5.1 (mEq/L)    Chloride 92 (*) 96 - 112 (mEq/L)    CO2 21  19 - 32 (mEq/L)    Glucose, Bld 104 (*) 70 - 99 (mg/dL)    BUN 7  6 - 23 (mg/dL)    Creatinine, Ser 0.45  0.50 - 1.10 (mg/dL)    Calcium 9.0  8.4 - 10.5 (mg/dL)    GFR calc non Af Amer >90  >90 (mL/min)    GFR calc Af Amer >90  >90 (mL/min)   BASIC METABOLIC PANEL     Status: Abnormal   Collection Time   07/02/11  3:15 PM      Component Value Range Comment   Sodium 124 (*) 135 - 145 (mEq/L)    Potassium 4.0  3.5 - 5.1 (mEq/L)    Chloride 88 (*) 96 - 112 (mEq/L)    CO2 24  19 - 32 (mEq/L)  Glucose, Bld 145 (*) 70 - 99 (mg/dL)    BUN 7  6 - 23 (mg/dL)    Creatinine, Ser 4.78  0.50 - 1.10 (mg/dL)    Calcium 9.2  8.4 - 10.5 (mg/dL)    GFR calc non Af Amer >90  >90 (mL/min)    GFR calc Af Amer >90  >90 (mL/min)   BASIC METABOLIC PANEL     Status: Abnormal   Collection Time   07/02/11  9:13 PM      Component Value Range Comment   Sodium 123 (*) 135 - 145 (mEq/L)    Potassium 4.1  3.5 - 5.1 (mEq/L)     Chloride 89 (*) 96 - 112 (mEq/L)    CO2 23  19 - 32 (mEq/L)    Glucose, Bld 115 (*) 70 - 99 (mg/dL)    BUN 6  6 - 23 (mg/dL)    Creatinine, Ser 2.95  0.50 - 1.10 (mg/dL)    Calcium 8.6  8.4 - 10.5 (mg/dL)    GFR calc non Af Amer >90  >90 (mL/min)    GFR calc Af Amer >90  >90 (mL/min)   BASIC METABOLIC PANEL     Status: Abnormal   Collection Time   07/03/11  3:33 AM      Component Value Range Comment   Sodium 127 (*) 135 - 145 (mEq/L)    Potassium 4.1  3.5 - 5.1 (mEq/L)    Chloride 91 (*) 96 - 112 (mEq/L)    CO2 26  19 - 32 (mEq/L)    Glucose, Bld 111 (*) 70 - 99 (mg/dL)    BUN 6  6 - 23 (mg/dL)    Creatinine, Ser 6.21  0.50 - 1.10 (mg/dL)    Calcium 9.1  8.4 - 10.5 (mg/dL)    GFR calc non Af Amer >90  >90 (mL/min)    GFR calc Af Amer >90  >90 (mL/min)   BASIC METABOLIC PANEL     Status: Abnormal   Collection Time   07/03/11 10:05 AM      Component Value Range Comment   Sodium 127 (*) 135 - 145 (mEq/L)    Potassium 4.2  3.5 - 5.1 (mEq/L)    Chloride 90 (*) 96 - 112 (mEq/L)    CO2 26  19 - 32 (mEq/L)    Glucose, Bld 113 (*) 70 - 99 (mg/dL)    BUN 8  6 - 23 (mg/dL)    Creatinine, Ser 3.08  0.50 - 1.10 (mg/dL)    Calcium 9.3  8.4 - 10.5 (mg/dL)    GFR calc non Af Amer 90 (*) >90 (mL/min)    GFR calc Af Amer >90  >90 (mL/min)   BASIC METABOLIC PANEL     Status: Abnormal   Collection Time   07/03/11  3:05 PM      Component Value Range Comment   Sodium 125 (*) 135 - 145 (mEq/L)    Potassium 4.0  3.5 - 5.1 (mEq/L)    Chloride 91 (*) 96 - 112 (mEq/L)    CO2 24  19 - 32 (mEq/L)    Glucose, Bld 118 (*) 70 - 99 (mg/dL)    BUN 11  6 - 23 (mg/dL)    Creatinine, Ser 6.57  0.50 - 1.10 (mg/dL)    Calcium 9.3  8.4 - 10.5 (mg/dL)    GFR calc non Af Amer 90 (*) >90 (mL/min)    GFR calc Af Amer >90  >90 (mL/min)  Dg Chest 2 View  06/26/2011  *RADIOLOGY REPORT*  Clinical Data: Cough.  CHEST - 2 VIEW 06/26/2011:  Comparison: Two-view chest x-ray yesterday and 11/28/2008.  Findings:  Cardiac silhouette upper normal in size to slightly enlarged, unchanged.  Thoracic aorta mildly atherosclerotic, unchanged.  Hilar and mediastinal contours otherwise unremarkable. Linear atelectasis in the right lower lobe.  Lungs otherwise clear. Pulmonary vascularity normal.  Emphysematous changes in the upper lobes.  Bronchovascular markings normal.  No pleural effusions. Degenerative changes involving the thoracic spine.  Bilateral breast prostheses.  IMPRESSION: Stable borderline mild cardiomegaly.  Mild linear atelectasis in the right lower lobe.  No acute cardiopulmonary disease otherwise. COPD/emphysema.  Original Report Authenticated By: Arnell Sieving, M.D.   Dg Knee 1-2 Views Left  06/26/2011  *RADIOLOGY REPORT*  Clinical Data: Pain without trauma  LEFT KNEE - 1-2 VIEW  Comparison: None.  Findings: No effusion.  There is narrowing of the articular cartilage in the medial compartment with small marginal spurs from the medial femoral condyle.  There are small patellar spurs. Negative for fracture, dislocation, or other acute bony abnormality.  Normal mineralization and alignment.  IMPRESSION: 1.  Negative for fracture or other acute bone injury. 2.  Degenerative changes most marked in the medial compartment.  Original Report Authenticated By: Osa Craver, M.D.   Dg Tibia/fibula Left  06/26/2011  *RADIOLOGY REPORT*  Clinical Data: Pain without trauma  LEFT TIBIA AND FIBULA - 2 VIEW  Comparison: None.  Findings: Negative for fracture, dislocation, or other acute abnormality.  Normal alignment and mineralization. No significant degenerative change.  Regional soft tissues unremarkable.  IMPRESSION:  Negative  Original Report Authenticated By: Osa Craver, M.D.   Mr Laqueta Jean Wo Contrast  06/30/2011  *RADIOLOGY REPORT*  Clinical Data: Confusion and headache.  History of melanoma.  MRI HEAD WITHOUT AND WITH CONTRAST  Technique:  Multiplanar, multiecho pulse sequences of the brain and  surrounding structures were obtained according to standard protocol without and with intravenous contrast  Contrast: 15mL MULTIHANCE GADOBENATE DIMEGLUMINE 529 MG/ML IV SOLN  Comparison: CT head 12/31/2006  Findings: Generalized atrophy.  Negative for hydrocephalus.  Mild chronic microvascular ischemia in the white matter.  No acute infarct.  Negative for intracranial hemorrhage or mass.  No enhancing lesions are seen suggestive of metastatic disease.  The enhancement pattern is normal.  Paranasal sinuses are clear. Degenerative changes C1-C2 with transverse ligament hypertrophy.  IMPRESSION: Atrophy and mild chronic microvascular ischemia.  No acute infarct or mass.  Original Report Authenticated By: Camelia Phenes, M.D.     Disposition: Home  Diet: Gen./regular  Activity: Increase activity slowly   Follow-up Appts: Discharge Orders    Future Orders Please Complete By Expires   Diet general      Increase activity slowly      Discharge instructions      Comments:   Follow up with REDMON,NOELLE, PA,/Dr Berline Chough as scheduled on 07/04/11 NO DRINKING ALCOHOL 1200CC/DAY FLUID RESTRICTION.       TESTS THAT NEED FOLLOW-UP Patient will need weekly BMET x 1 month for stabilization of sodium. CMET tomorrow on follow up with PCP TO FOLLOW UIP ELECTROLYTES AND LFTs.  Time spent on discharge, talking to the patient, and coordinating care: 60 mins.   SignedRamiro Harvest [phone] 07/03/2011, 4:19 PM

## 2012-03-20 ENCOUNTER — Other Ambulatory Visit: Payer: Self-pay | Admitting: Plastic Surgery

## 2013-04-11 ENCOUNTER — Other Ambulatory Visit: Payer: Self-pay | Admitting: Obstetrics & Gynecology

## 2013-05-03 ENCOUNTER — Other Ambulatory Visit: Payer: Self-pay | Admitting: Physician Assistant

## 2013-05-03 ENCOUNTER — Ambulatory Visit
Admission: RE | Admit: 2013-05-03 | Discharge: 2013-05-03 | Disposition: A | Discharge status: Home / Self Care | Payer: 59 | Source: Ambulatory Visit | Attending: Physician Assistant | Admitting: Physician Assistant

## 2013-05-03 DIAGNOSIS — T1490XA Injury, unspecified, initial encounter: Secondary | ICD-10-CM

## 2013-10-30 ENCOUNTER — Other Ambulatory Visit: Payer: Self-pay | Admitting: Obstetrics & Gynecology

## 2013-10-31 LAB — CYTOLOGY - PAP

## 2014-04-14 ENCOUNTER — Other Ambulatory Visit: Payer: Self-pay | Admitting: Dermatology

## 2014-07-01 ENCOUNTER — Other Ambulatory Visit: Payer: Self-pay | Admitting: Obstetrics & Gynecology

## 2014-07-03 LAB — CYTOLOGY - PAP

## 2014-07-04 ENCOUNTER — Other Ambulatory Visit: Payer: Self-pay | Admitting: Obstetrics & Gynecology

## 2014-07-04 DIAGNOSIS — R928 Other abnormal and inconclusive findings on diagnostic imaging of breast: Secondary | ICD-10-CM

## 2014-07-09 ENCOUNTER — Ambulatory Visit
Admission: RE | Admit: 2014-07-09 | Discharge: 2014-07-09 | Disposition: A | Discharge status: Home / Self Care | Payer: 59 | Source: Ambulatory Visit | Attending: Obstetrics & Gynecology | Admitting: Obstetrics & Gynecology

## 2014-07-09 DIAGNOSIS — R928 Other abnormal and inconclusive findings on diagnostic imaging of breast: Secondary | ICD-10-CM

## 2014-10-23 ENCOUNTER — Encounter (HOSPITAL_BASED_OUTPATIENT_CLINIC_OR_DEPARTMENT_OTHER): Payer: Self-pay | Admitting: *Deleted

## 2014-10-23 ENCOUNTER — Emergency Department (HOSPITAL_BASED_OUTPATIENT_CLINIC_OR_DEPARTMENT_OTHER): Payer: Commercial Managed Care - HMO

## 2014-10-23 ENCOUNTER — Emergency Department (HOSPITAL_BASED_OUTPATIENT_CLINIC_OR_DEPARTMENT_OTHER)
Admission: EM | Admit: 2014-10-23 | Discharge: 2014-10-23 | Disposition: A | Discharge status: Home / Self Care | Payer: Commercial Managed Care - HMO | Attending: Emergency Medicine | Admitting: Emergency Medicine

## 2014-10-23 DIAGNOSIS — M1990 Unspecified osteoarthritis, unspecified site: Secondary | ICD-10-CM | POA: Insufficient documentation

## 2014-10-23 DIAGNOSIS — Y998 Other external cause status: Secondary | ICD-10-CM | POA: Insufficient documentation

## 2014-10-23 DIAGNOSIS — F329 Major depressive disorder, single episode, unspecified: Secondary | ICD-10-CM | POA: Insufficient documentation

## 2014-10-23 DIAGNOSIS — S0990XA Unspecified injury of head, initial encounter: Secondary | ICD-10-CM | POA: Diagnosis present

## 2014-10-23 DIAGNOSIS — F419 Anxiety disorder, unspecified: Secondary | ICD-10-CM | POA: Insufficient documentation

## 2014-10-23 DIAGNOSIS — W01198A Fall on same level from slipping, tripping and stumbling with subsequent striking against other object, initial encounter: Secondary | ICD-10-CM | POA: Insufficient documentation

## 2014-10-23 DIAGNOSIS — R519 Headache, unspecified: Secondary | ICD-10-CM

## 2014-10-23 DIAGNOSIS — R51 Headache: Secondary | ICD-10-CM

## 2014-10-23 DIAGNOSIS — Y9389 Activity, other specified: Secondary | ICD-10-CM | POA: Insufficient documentation

## 2014-10-23 DIAGNOSIS — Y9289 Other specified places as the place of occurrence of the external cause: Secondary | ICD-10-CM | POA: Insufficient documentation

## 2014-10-23 DIAGNOSIS — Z79899 Other long term (current) drug therapy: Secondary | ICD-10-CM | POA: Diagnosis not present

## 2014-10-23 DIAGNOSIS — Z8719 Personal history of other diseases of the digestive system: Secondary | ICD-10-CM | POA: Insufficient documentation

## 2014-10-23 DIAGNOSIS — Z85820 Personal history of malignant melanoma of skin: Secondary | ICD-10-CM | POA: Insufficient documentation

## 2014-10-23 DIAGNOSIS — Z87891 Personal history of nicotine dependence: Secondary | ICD-10-CM | POA: Diagnosis not present

## 2014-10-23 DIAGNOSIS — Z862 Personal history of diseases of the blood and blood-forming organs and certain disorders involving the immune mechanism: Secondary | ICD-10-CM | POA: Diagnosis not present

## 2014-10-23 HISTORY — DX: Unspecified displaced fracture of first cervical vertebra, initial encounter for closed fracture: S12.000A

## 2014-10-23 MED ORDER — METOCLOPRAMIDE HCL 5 MG/ML IJ SOLN
10.0000 mg | Freq: Once | INTRAMUSCULAR | Status: AC
  Administered 2014-10-23: 10 mg via INTRAMUSCULAR
  Filled 2014-10-23: qty 2

## 2014-10-23 MED ORDER — KETOROLAC TROMETHAMINE 60 MG/2ML IM SOLN: 30.0000 mg | Freq: Once | INTRAMUSCULAR | Status: DC

## 2014-10-23 MED ORDER — DIPHENHYDRAMINE HCL 50 MG/ML IJ SOLN
25.0000 mg | Freq: Once | INTRAMUSCULAR | Status: AC
  Administered 2014-10-23: 25 mg via INTRAMUSCULAR
  Filled 2014-10-23: qty 1

## 2014-10-23 MED ORDER — ONDANSETRON 4 MG PO TBDP
4.0000 mg | ORAL_TABLET | Freq: Once | ORAL | Status: AC
  Administered 2014-10-23: 4 mg via ORAL
  Filled 2014-10-23: qty 1

## 2014-10-23 MED ORDER — KETOROLAC TROMETHAMINE 30 MG/ML IJ SOLN
INTRAMUSCULAR | Status: AC
  Filled 2014-10-23: qty 1

## 2014-10-23 NOTE — Discharge Instructions (Signed)

## 2014-10-23 NOTE — ED Notes (Signed)
Pt drinking and ambulating well.  Dr. Wyvonnia Dusky notified.

## 2014-10-23 NOTE — ED Notes (Signed)
States she slipped on ice in [employer]'s parking lot 1 week ago (a bag of ice had been dropped)- states "face planted"- denies LOC- Today presents for headaches, "equilibrium off", ataxia ("going to the right when I'm not meaning"), +nausea. Here with service animal

## 2014-10-23 NOTE — ED Provider Notes (Signed)
CSN: [id number]     Arrival date & time 10/23/14  1516 History   First MD Initiated Contact with Patient 10/23/14 1556     Chief Complaint  Patient presents with  . Fall     (Consider location/radiation/quality/duration/timing/severity/associated sxs/prior Treatment) HPI Comments: Patient states she had a slip and fall about 1 week ago and a bag of ice in the parking lot. She slipped and fell floor striking her forehead on the pavement. Denies that she was knocked out. She's been having headaches ever since with photophobia, feeling dizzy, feeling nauseated and having several episodes of vomiting. She feels like she is drifting to the right when she walks. Denies any focal weakness, numbness or tingling. Denies any chest pain or shortness of breath. Denies any syncope. She's been taking ibuprofen and Aleve without relief. She had one episode of vomiting today after eating. She denies any visual changes or seeing any spots. She does not take any blood thinners.  The history is provided by the patient.    Past Medical History  Diagnosis Date  . Allergy   . Anxiety   . Arthritis   . Cancer     melanoma  . Depression   . GERD (gastroesophageal reflux disease)   . Hypertension   . Anemia 06/29/2011  . C1 cervical fracture    Past Surgical History  Procedure Laterality Date  . Cholecystectomy    . Collarbone    . Rotator cuff repair      left  . Knee surgery      removal of cyst, repair of cartiledge  . Incontinence surgery    . Laparoscopy      for endometriosis   Family History  Problem Relation Age of Onset  . Colon cancer Neg Hx    Social History  Substance Use Topics  . Smoking status: Former Smoker -- 2.00 packs/day    Types: Cigarettes  . Smokeless tobacco: Never Used  . Alcohol Use: 10.5 oz/week    21 Standard drinks or equivalent per week     Comment: 1-2 drinks daily (mixed vodka drinks); smokes e-cigs   OB History    No data available     Review of Systems   Constitutional: Positive for activity change and appetite change. Negative for fever.  HENT: Negative for congestion and rhinorrhea.   Eyes: Positive for photophobia. Negative for visual disturbance.  Respiratory: Negative for cough, chest tightness and shortness of breath.   Cardiovascular: Negative for chest pain and leg swelling.  Gastrointestinal: Positive for nausea. Negative for vomiting and abdominal pain.  Genitourinary: Negative for dysuria, hematuria, vaginal bleeding and vaginal discharge.  Musculoskeletal: Negative for myalgias and arthralgias.  Skin: Negative for wound.  Neurological: Positive for dizziness and headaches. Negative for weakness and numbness.  A complete 10 system review of systems was obtained and all systems are negative except as noted in the HPI and PMH.      Allergies  Percocet  Home Medications   Prior to Admission medications   Medication Sig Start Date End Date Taking? Authorizing Provider  ALPRAZolam Duanne Moron) 0.5 MG tablet Take 0.5 mg by mouth 2 (two) times daily as needed. For anxiety 08/10/10  Yes Historical Provider, MD  LamoTRIgine (LAMICTAL PO) Take by mouth.   Yes Historical Provider, MD  MedroxyPROGESTERone Acetate (PROVERA PO) Take by mouth.   Yes Historical Provider, MD  Multiple Vitamin (MULTIVITAMIN PO) Take 1 tablet by mouth daily.     Yes Historical Provider, MD  nebivolol 20 MG TABS Take 2 tablets (40 mg total) by mouth daily. 07/03/11 10/23/14 Yes Eugenie Filler, MD  PREMPRO 0.625-2.5 MG per tablet Take 1 tablet by mouth Daily.  08/04/10  Yes Historical Provider, MD  Sertraline HCl (ZOLOFT PO) Take by mouth.   Yes Historical Provider, MD  amLODipine (NORVASC) 5 MG tablet Take 1 tablet (5 mg total) by mouth daily. 07/03/11 07/02/12  Eugenie Filler, MD  buPROPion (WELLBUTRIN XL) 300 MG 24 hr tablet 1 tablet Daily. 09/06/10   Historical Provider, MD  CELEBREX 200 MG capsule Take 200 mg by mouth Daily.  07/30/10   Historical Provider, MD   cimetidine (TAGAMET) 400 MG tablet Take 400 mg by mouth Daily.  09/06/10   Historical Provider, MD  escitalopram (LEXAPRO) 20 MG tablet Take 20 mg by mouth daily.    Historical Provider, MD   BP 122/76 mmHg  Pulse 78  Temp(Src) 97.9 F (36.6 C) (Oral)  Resp 16  Ht 5\' 3"  (1.6 m)  Wt 142 lb (64.411 kg)  BMI 25.16 kg/m2  SpO2 96% Physical Exam  Constitutional: She is oriented to person, place, and time. She appears well-developed and well-nourished. No distress.  HENT:  Head: Normocephalic and atraumatic.  Mouth/Throat: Oropharynx is clear and moist. No oropharyngeal exudate.  Eyes: Conjunctivae and EOM are normal. Pupils are equal, round, and reactive to light.  No nystagmus  Neck: Normal range of motion. Neck supple.  No meningismus.  Cardiovascular: Normal rate, regular rhythm, normal heart sounds and intact distal pulses.   No murmur heard. Pulmonary/Chest: Effort normal and breath sounds normal. No respiratory distress.  Abdominal: Soft. There is no tenderness. There is no rebound and no guarding.  Musculoskeletal: Normal range of motion. She exhibits no edema or tenderness.  Neurological: She is alert and oriented to person, place, and time. No cranial nerve deficit. She exhibits normal muscle tone. Coordination normal.  No ataxia on finger to nose bilaterally. No pronator drift. 5/5 strength throughout. CN 2-12 intact. Negative Romberg. Equal grip strength. Sensation intact. Gait is normal.   Skin: Skin is warm.  Psychiatric: She has a normal mood and affect. Her behavior is normal.  Nursing note and vitals reviewed.   ED Course  Procedures (including critical care time) Labs Review Labs Reviewed - No data to display  Imaging Review Ct Head Wo Contrast  10/23/2014   CLINICAL DATA:  Status post fall 1 week ago with a blow to the forehead. Continued pain. Initial encounter.  EXAM: CT HEAD WITHOUT CONTRAST  CT CERVICAL SPINE WITHOUT CONTRAST  TECHNIQUE: Multidetector CT  imaging of the head and cervical spine was performed following the standard protocol without intravenous contrast. Multiplanar CT image reconstructions of the cervical spine were also generated.  COMPARISON:  Head and cervical spine CT scans 12/31/2006.  FINDINGS: CT HEAD FINDINGS  There is cortical atrophy and chronic microvascular ischemic change. No evidence of acute abnormality including hemorrhage, infarct, mass lesion, mass effect, midline shift or abnormal extra-axial fluid collection is identified. No hydrocephalus or pneumocephalus. The calvarium is intact. Imaged paranasal sinuses and mastoid air cells are clear.  CT CERVICAL SPINE FINDINGS  Vertebral body height is maintained. The C4-5 level is fused with approximately 0.2 cm anterolisthesis C4 on C5. There has been marked progression of loss of disc space height and endplate spurring at I3-3 and C6-7. Marked multilevel facet degenerative change is identified and has worsened since the prior exam. There is also marked are degenerative change about  the articulations of the lateral masses of C1-2 bilaterally, worse on the left. Imaged paraspinous structures are unremarkable. The lung apices demonstrate emphysematous disease.  IMPRESSION: No acute abnormality head or cervical spine.  Atrophy and chronic microvascular ischemic change.  Progressive, marked cervical spondylosis.  Emphysema.   Electronically Signed   By: Inge Rise M.D.   On: 10/23/2014 17:18   Ct Cervical Spine Wo Contrast  10/23/2014   CLINICAL DATA:  Status post fall 1 week ago with a blow to the forehead. Continued pain. Initial encounter.  EXAM: CT HEAD WITHOUT CONTRAST  CT CERVICAL SPINE WITHOUT CONTRAST  TECHNIQUE: Multidetector CT imaging of the head and cervical spine was performed following the standard protocol without intravenous contrast. Multiplanar CT image reconstructions of the cervical spine were also generated.  COMPARISON:  Head and cervical spine CT scans 12/31/2006.   FINDINGS: CT HEAD FINDINGS  There is cortical atrophy and chronic microvascular ischemic change. No evidence of acute abnormality including hemorrhage, infarct, mass lesion, mass effect, midline shift or abnormal extra-axial fluid collection is identified. No hydrocephalus or pneumocephalus. The calvarium is intact. Imaged paranasal sinuses and mastoid air cells are clear.  CT CERVICAL SPINE FINDINGS  Vertebral body height is maintained. The C4-5 level is fused with approximately 0.2 cm anterolisthesis C4 on C5. There has been marked progression of loss of disc space height and endplate spurring at Y6-9 and C6-7. Marked multilevel facet degenerative change is identified and has worsened since the prior exam. There is also marked are degenerative change about the articulations of the lateral masses of C1-2 bilaterally, worse on the left. Imaged paraspinous structures are unremarkable. The lung apices demonstrate emphysematous disease.  IMPRESSION: No acute abnormality head or cervical spine.  Atrophy and chronic microvascular ischemic change.  Progressive, marked cervical spondylosis.  Emphysema.   Electronically Signed   By: Inge Rise M.D.   On: 10/23/2014 17:18   I have personally reviewed and evaluated these images and lab results as part of my medical decision-making.   EKG Interpretation None      MDM   Final diagnoses:  Headache, unspecified headache type   Headache, nausea, photophobia since fall 1 week ago.neurologically intact.  CT head and C-spine are negative for acute pathology. Patient feels improved after treatment for headache with Toradol, Reglan and Benadryl.  She is tolerating by mouth and ambulatory. She has no ataxia. She has normal gait. No nystagmus. nonfocal  nonfocal Neurological exam.  Suspect concussion. Head injury precautions given. Follow-up with PCP. Return precautions discussed.     Ezequiel Essex, MD 10/24/14 [phone]

## 2015-04-10 ENCOUNTER — Encounter (HOSPITAL_BASED_OUTPATIENT_CLINIC_OR_DEPARTMENT_OTHER): Payer: Self-pay | Admitting: *Deleted

## 2015-04-10 ENCOUNTER — Emergency Department (HOSPITAL_BASED_OUTPATIENT_CLINIC_OR_DEPARTMENT_OTHER)
Admission: EM | Admit: 2015-04-10 | Discharge: 2015-04-10 | Disposition: A | Discharge status: Home / Self Care | Payer: Commercial Managed Care - HMO | Attending: Emergency Medicine | Admitting: Emergency Medicine

## 2015-04-10 DIAGNOSIS — Z87891 Personal history of nicotine dependence: Secondary | ICD-10-CM | POA: Diagnosis not present

## 2015-04-10 DIAGNOSIS — M1990 Unspecified osteoarthritis, unspecified site: Secondary | ICD-10-CM | POA: Diagnosis not present

## 2015-04-10 DIAGNOSIS — Z8781 Personal history of (healed) traumatic fracture: Secondary | ICD-10-CM | POA: Insufficient documentation

## 2015-04-10 DIAGNOSIS — Z791 Long term (current) use of non-steroidal anti-inflammatories (NSAID): Secondary | ICD-10-CM | POA: Insufficient documentation

## 2015-04-10 DIAGNOSIS — F329 Major depressive disorder, single episode, unspecified: Secondary | ICD-10-CM | POA: Insufficient documentation

## 2015-04-10 DIAGNOSIS — F419 Anxiety disorder, unspecified: Secondary | ICD-10-CM | POA: Diagnosis not present

## 2015-04-10 DIAGNOSIS — Z85820 Personal history of malignant melanoma of skin: Secondary | ICD-10-CM | POA: Diagnosis not present

## 2015-04-10 DIAGNOSIS — E876 Hypokalemia: Secondary | ICD-10-CM | POA: Diagnosis not present

## 2015-04-10 DIAGNOSIS — R197 Diarrhea, unspecified: Secondary | ICD-10-CM | POA: Diagnosis present

## 2015-04-10 DIAGNOSIS — I10 Essential (primary) hypertension: Secondary | ICD-10-CM | POA: Insufficient documentation

## 2015-04-10 DIAGNOSIS — Z79899 Other long term (current) drug therapy: Secondary | ICD-10-CM | POA: Diagnosis not present

## 2015-04-10 DIAGNOSIS — Z862 Personal history of diseases of the blood and blood-forming organs and certain disorders involving the immune mechanism: Secondary | ICD-10-CM | POA: Insufficient documentation

## 2015-04-10 LAB — BASIC METABOLIC PANEL
Anion gap: 11 (ref 5–15)
BUN: 11 mg/dL (ref 6–20)
CALCIUM: 7.9 mg/dL — AB (ref 8.9–10.3)
CO2: 23 mmol/L (ref 22–32)
CREATININE: 0.73 mg/dL (ref 0.44–1.00)
Chloride: 99 mmol/L — ABNORMAL LOW (ref 101–111)
GFR calc non Af Amer: >60 mL/min (ref >60)
Glucose, Bld: 90 mg/dL (ref 65–99)
Potassium: 3.3 mmol/L — ABNORMAL LOW (ref 3.5–5.1)
Sodium: 133 mmol/L — ABNORMAL LOW (ref 135–145)

## 2015-04-10 LAB — CBC WITH DIFFERENTIAL/PLATELET
BASOS ABS: 0 10*3/uL (ref 0.0–0.1)
Basophils Relative: 0 %
Eosinophils Absolute: 0 10*3/uL (ref 0.0–0.7)
Eosinophils Relative: 0 %
HCT: 42.7 % (ref 36.0–46.0)
Hemoglobin: 14.2 g/dL (ref 12.0–15.0)
Lymphocytes Relative: 19 %
Lymphs Abs: 1.9 10*3/uL (ref 0.7–4.0)
MCH: 32.3 pg (ref 26.0–34.0)
MCHC: 33.3 g/dL (ref 30.0–36.0)
MCV: 97 fL (ref 78.0–100.0)
MONO ABS: 1.1 10*3/uL — AB (ref 0.1–1.0)
Monocytes Relative: 11 %
NEUTROS ABS: 7.1 10*3/uL (ref 1.7–7.7)
Neutrophils Relative %: 70 %
Platelets: 388 10*3/uL (ref 150–400)
RBC: 4.4 MIL/uL (ref 3.87–5.11)
RDW: 11.9 % (ref 11.5–15.5)
WBC: 10.1 10*3/uL (ref 4.0–10.5)

## 2015-04-10 MED ORDER — MORPHINE SULFATE (PF) 4 MG/ML IV SOLN
4.0000 mg | Freq: Once | INTRAVENOUS | Status: AC
  Administered 2015-04-10: 4 mg via INTRAVENOUS
  Filled 2015-04-10: qty 1

## 2015-04-10 MED ORDER — DICYCLOMINE HCL 20 MG PO TABS: 20.0000 mg | ORAL_TABLET | Freq: Three times a day (TID) | ORAL | Status: DC

## 2015-04-10 MED ORDER — SODIUM CHLORIDE 0.9 % IV BOLUS (SEPSIS)
1000.0000 mL | Freq: Once | INTRAVENOUS | Status: AC
  Administered 2015-04-10: 1000 mL via INTRAVENOUS

## 2015-04-10 MED ORDER — POTASSIUM CHLORIDE CRYS ER 20 MEQ PO TBCR
40.0000 meq | EXTENDED_RELEASE_TABLET | Freq: Once | ORAL | Status: AC
  Administered 2015-04-10: 40 meq via ORAL
  Filled 2015-04-10: qty 2

## 2015-04-10 MED ORDER — POTASSIUM CHLORIDE CRYS ER 20 MEQ PO TBCR: 20.0000 meq | EXTENDED_RELEASE_TABLET | Freq: Two times a day (BID) | ORAL | Status: DC

## 2015-04-10 NOTE — ED Provider Notes (Signed)
CSN: [id number]     Arrival date & time 04/10/15  1544 History   First MD Initiated Contact with Patient 04/10/15 1556     Chief Complaint  Patient presents with  . Diarrhea      HPI Patient presents emergency department with diarrhea without nausea vomiting over the past 7 days.  No blood noted in her stool.  She reports abdominal cramping.  She did visit [place] several weeks ago but had no diarrhea in [place] or immediately after [place].  She did recently finished a course of Augmentin for a severe sore throat.  She states the diarrhea began shortly after that.  No fevers or chills.  No history of C. difficile colitis.   Past Medical History  Diagnosis Date  . Allergy   . Anxiety   . Arthritis   . Cancer ([place])     melanoma  . Depression   . GERD (gastroesophageal reflux disease)   . Hypertension   . Anemia 06/29/2011  . C1 cervical fracture [facility])    Past Surgical History  Procedure Laterality Date  . Cholecystectomy    . Collarbone    . Rotator cuff repair      left  . Knee surgery      removal of cyst, repair of cartiledge  . Incontinence surgery    . Laparoscopy      for endometriosis   Family History  Problem Relation Age of Onset  . Colon cancer Neg Hx    Social History  Substance Use Topics  . Smoking status: Former Smoker -- 2.00 packs/day    Types: Cigarettes  . Smokeless tobacco: Never Used  . Alcohol Use: 10.5 oz/week    21 Standard drinks or equivalent per week     Comment: 1-2 drinks daily (mixed vodka drinks); smokes e-cigs   OB History    No data available     Review of Systems  All other systems reviewed and are negative.     Allergies  Percocet  Home Medications   Prior to Admission medications   Medication Sig Start Date End Date Taking? Authorizing Provider  ALPRAZolam Duanne Moron) 0.5 MG tablet Take 0.5 mg by mouth 2 (two) times daily as needed. For anxiety 08/10/10   Historical Provider, MD  amLODipine (NORVASC) 5 MG tablet Take 1  tablet (5 mg total) by mouth daily. 07/03/11 07/02/12  Eugenie Filler, MD  buPROPion (WELLBUTRIN XL) 300 MG 24 hr tablet 1 tablet Daily. 09/06/10   Historical Provider, MD  CELEBREX 200 MG capsule Take 200 mg by mouth Daily.  07/30/10   Historical Provider, MD  cimetidine (TAGAMET) 400 MG tablet Take 400 mg by mouth Daily.  09/06/10   Historical Provider, MD  escitalopram (LEXAPRO) 20 MG tablet Take 20 mg by mouth daily.    Historical Provider, MD  LamoTRIgine (LAMICTAL PO) Take by mouth.    Historical Provider, MD  MedroxyPROGESTERone Acetate (PROVERA PO) Take by mouth.    Historical Provider, MD  Multiple Vitamin (MULTIVITAMIN PO) Take 1 tablet by mouth daily.      Historical Provider, MD  nebivolol 20 MG TABS Take 2 tablets (40 mg total) by mouth daily. 07/03/11 10/23/14  Eugenie Filler, MD  PREMPRO 0.625-2.5 MG per tablet Take 1 tablet by mouth Daily.  08/04/10   Historical Provider, MD  Sertraline HCl (ZOLOFT PO) Take by mouth.    Historical Provider, MD   BP 140/95 mmHg  Pulse 77  Temp(Src) 97.6 F (36.4 C) (Oral)  Resp 18  SpO2 100% Physical Exam  Constitutional: She is oriented to person, place, and time. She appears well-developed and well-nourished. No distress.  HENT:  Head: Normocephalic and atraumatic.  Eyes: EOM are normal.  Neck: Normal range of motion.  Cardiovascular: Normal rate, regular rhythm and normal heart sounds.   Pulmonary/Chest: Effort normal and breath sounds normal.  Abdominal: Soft. She exhibits no distension. There is no tenderness.  Musculoskeletal: Normal range of motion.  Neurological: She is alert and oriented to person, place, and time.  Skin: Skin is warm and dry.  Psychiatric: She has a normal mood and affect. Judgment normal.  Nursing note and vitals reviewed.   ED Course  Procedures (including critical care time) Labs Review Labs Reviewed  CBC WITH DIFFERENTIAL/PLATELET - Abnormal; Notable for the following:    Monocytes Absolute 1.1 (*)     All other components within normal limits  BASIC METABOLIC PANEL - Abnormal; Notable for the following:    Sodium 133 (*)    Potassium 3.3 (*)    Chloride 99 (*)    Calcium 7.9 (*)    All other components within normal limits  C DIFFICILE QUICK SCREEN W PCR REFLEX  OVA + PARASITE EXAM    Imaging Review No results found. I have personally reviewed and evaluated these images and lab results as part of my medical decision-making.   EKG Interpretation None      MDM   Final diagnoses:  None    Patient feels much better after IV fluids.  Discharge home in good condition.  Mild hypokalemia.  C. difficile and O&P sent.  These have not returned.  At this time I think this will likely resolve on its own.  Home with a short course of potassium.  She understands to follow-up with her primary care physician.  She'll be contacted if any of her stool specimens come back positive.    Jola Schmidt, MD 04/10/15 [phone]

## 2015-04-10 NOTE — Discharge Instructions (Signed)

## 2015-04-10 NOTE — ED Notes (Signed)
Diarrhea x 7 days with HA, abdominal cramping.

## 2015-04-13 LAB — C DIFFICILE QUICK SCREEN W PCR REFLEX
C Diff antigen: NEGATIVE
C Diff interpretation: NEGATIVE
C Diff toxin: NEGATIVE

## 2015-04-14 LAB — O&P RESULT

## 2015-04-14 LAB — OVA + PARASITE EXAM

## 2015-07-09 ENCOUNTER — Other Ambulatory Visit (HOSPITAL_COMMUNITY): Payer: Self-pay | Admitting: Psychiatry

## 2016-10-12 ENCOUNTER — Encounter: Payer: Self-pay | Admitting: Gastroenterology

## 2016-12-08 ENCOUNTER — Ambulatory Visit (INDEPENDENT_AMBULATORY_CARE_PROVIDER_SITE_OTHER): Payer: 59 | Admitting: Gastroenterology

## 2016-12-08 ENCOUNTER — Encounter: Payer: Self-pay | Admitting: Gastroenterology

## 2016-12-08 VITALS — BP 140/100 | HR 80 | Ht 63.0 in | Wt 135.6 lb

## 2016-12-08 DIAGNOSIS — R1013 Epigastric pain: Secondary | ICD-10-CM

## 2016-12-08 DIAGNOSIS — K219 Gastro-esophageal reflux disease without esophagitis: Secondary | ICD-10-CM

## 2016-12-08 DIAGNOSIS — R634 Abnormal weight loss: Secondary | ICD-10-CM

## 2016-12-08 DIAGNOSIS — G8929 Other chronic pain: Secondary | ICD-10-CM

## 2016-12-08 DIAGNOSIS — K588 Other irritable bowel syndrome: Secondary | ICD-10-CM | POA: Diagnosis not present

## 2016-12-08 DIAGNOSIS — R112 Nausea with vomiting, unspecified: Secondary | ICD-10-CM

## 2016-12-08 DIAGNOSIS — R1011 Right upper quadrant pain: Secondary | ICD-10-CM

## 2016-12-08 NOTE — Patient Instructions (Signed)
You have been scheduled for an abdominal ultrasound at [facility] (1st floor of hospital) on 12/13/2016 at 10:30pm. Please arrive 15 minutes prior to your appointment for registration. Make certain not to have anything to eat or drink 6 hours prior to your appointment. Should you need to reschedule your appointment, please contact radiology at [phone]. This test typically takes about 30 minutes to perform.  You have been scheduled for an endoscopy. Please follow written instructions given to you at your visit today. If you use inhalers (even only as needed), please bring them with you on the day of your procedure. Your physician has requested that you go to www.startemmi.com and enter the access code given to you at your visit today. This web site gives a general overview about your procedure. However, you should still follow specific instructions given to you by our office regarding your preparation for the procedure.  Use IBGard 1 capsule three times a day

## 2016-12-08 NOTE — Progress Notes (Signed)
Mindy Young    [id number]    12-03-1945  Primary Care Physician:Redmon, Enid Cutter  Referring Physician: Maisie Fus, MD [address] [place], [place] [address]  Chief complaint:  Nausea, vomiting, IBS, weight loss, intermittent diarrhea  HPI:  71 yr F s/p cholecystectomy, IBS, chronic intermittent diarrhea here to reestablish care. Patient was followed by Dr. Olevia Perches in the past, last seen in July 2012. She has no appetite as she is worried about chocking and vomiting. She has lost 35 lbs in past 3 months? Her symptoms are worse at night She mostly dry heaves and vomits bile She had pain in upper abdomen, witrh projectile vomiting last Thursday along with diarrhea She was bloated and hurting  and had vomiting 3-4 times in the past 1 week mostly early mornings waking her up from sleep. No longer vomiting but continues to have intermittent nausea, zofran helps somewhat No recent medications changes BP was elevated and was started on new meds in past few months Depression medicine was changed about 6 months ago,  Started on Nexium and Zantac by PMD with no improvement, since patient stopped Nexium and is currently only on Zantac with no significant change  C.diff negative Ova & parasite negative  Colonoscopy July 2012: 2 diminutive hyperplastic polyps removed from rectum, small internal hemorrhoids otherwise normal exam Recall colonoscopy in 10 years  Outpatient Encounter Prescriptions as of 12/08/2016  Medication Sig  . ALPRAZolam (XANAX) 0.5 MG tablet Take 0.5 mg by mouth 2 (two) times daily as needed. For anxiety  . BYSTOLIC 20 MG TABS Take 2 tablets by mouth daily.  . cloNIDine (CATAPRES) 0.1 MG tablet Take 1 tablet by mouth 2 (two) times daily.  Marland Kitchen dicyclomine (BENTYL) 20 MG tablet Take 1 tablet (20 mg total) by mouth 3 (three) times daily before meals.  . LamoTRIgine (LAMICTAL PO) Take by mouth.  . methylphenidate (RITALIN) 10 MG tablet Take  1 tablet by mouth daily.  . ondansetron (ZOFRAN) 8 MG tablet Take 1 tablet by mouth daily.  Marland Kitchen oxybutynin (DITROPAN-XL) 10 MG 24 hr tablet Take 1 tablet by mouth daily.  Marland Kitchen PREMPRO 0.625-2.5 MG per tablet Take 1 tablet by mouth Daily.   . ranitidine (ZANTAC) 150 MG tablet Take 150 mg by mouth daily.  . TRINTELLIX 20 MG TABS Take 1 tablet by mouth daily.  . [DISCONTINUED] amLODipine (NORVASC) 5 MG tablet Take 1 tablet (5 mg total) by mouth daily.  . [DISCONTINUED] buPROPion (WELLBUTRIN XL) 300 MG 24 hr tablet 1 tablet Daily.  . [DISCONTINUED] CELEBREX 200 MG capsule Take 200 mg by mouth Daily.   . [DISCONTINUED] cimetidine (TAGAMET) 400 MG tablet Take 400 mg by mouth Daily.   . [DISCONTINUED] escitalopram (LEXAPRO) 20 MG tablet Take 20 mg by mouth daily.  . [DISCONTINUED] MedroxyPROGESTERone Acetate (PROVERA PO) Take by mouth.  . [DISCONTINUED] Multiple Vitamin (MULTIVITAMIN PO) Take 1 tablet by mouth daily.    . [DISCONTINUED] nebivolol 20 MG TABS Take 2 tablets (40 mg total) by mouth daily.  . [DISCONTINUED] potassium chloride SA (K-DUR,KLOR-CON) 20 MEQ tablet Take 1 tablet (20 mEq total) by mouth 2 (two) times daily.  . [DISCONTINUED] Sertraline HCl (ZOLOFT PO) Take by mouth.   No facility-administered encounter medications on file as of 12/08/2016.     Allergies as of 12/08/2016 - Review Complete 12/08/2016  Allergen Reaction Noted  . Percocet [oxycodone-acetaminophen] Other (See Comments) 10/23/2014    Past  Medical History:  Diagnosis Date  . Allergy   . Anemia 06/29/2011  . Anxiety   . Arthritis   . C1 cervical fracture ([place])   . Cancer ([place])    melanoma  . Depression   . GERD (gastroesophageal reflux disease)   . Hypertension     Past Surgical History:  Procedure Laterality Date  . CHOLECYSTECTOMY    . collarbone    . INCONTINENCE SURGERY    . KNEE SURGERY     removal of cyst, repair of cartiledge  . LAPAROSCOPY     for endometriosis  . ROTATOR CUFF REPAIR     left      Family History  Problem Relation Age of Onset  . COPD Mother   . Prostate cancer Father   . Colon polyps Father   . Breast cancer Sister   . Colon cancer Neg Hx     Social History   Social History  . Marital status: Married    Spouse name: N/A  . Number of children: 2  . Years of education: N/A   Occupational History  . Not on file.   Social History Main Topics  . Smoking status: Former Smoker    Packs/day: 2.00    Types: Cigarettes  . Smokeless tobacco: Never Used  . Alcohol use 10.5 oz/week    21 Standard drinks or equivalent per week     Comment: 1-2 drinks daily (mixed vodka drinks); smokes e-cigs  . Drug use: No  . Sexual activity: Not on file   Other Topics Concern  . Not on file   Social History Narrative  . No narrative on file      Review of systems: Review of Systems  Constitutional: Negative for fever and chills. Weight loss, fatigue HENT: Negative.   Eyes: Negative for blurred vision.  Respiratory: Negative for cough, shortness of breath and wheezing.   Cardiovascular: Negative for chest pain and palpitations.  Gastrointestinal: as per HPI Genitourinary: Negative for dysuria, urgency, frequency and hematuria.  Musculoskeletal: Negative for myalgias, back pain and joint pain.  Skin: Negative for itching and rash.  Neurological: Negative for dizziness, tremors, focal weakness, seizures and loss of consciousness. Positive for heaaches Endo/Heme/Allergies: Negative for seasonal allergies.  Psychiatric/Behavioral: Negative for suicidal ideas and hallucinations. Positive for depression and anxiety All other systems reviewed and are negative.   Physical Exam: Vitals:   12/08/16 0929  BP: (!) 140/100  Pulse: 80   Body mass index is 24.02 kg/m. Gen:      No acute distress HEENT:  EOMI, sclera anicteric Neck:     No masses; no thyromegaly Lungs:    Clear to auscultation bilaterally; normal respiratory effort CV:         Regular rate and  rhythm; no murmurs Abd:      + bowel sounds; soft, non-tender; no palpable masses, no distension Ext:    No edema; adequate peripheral perfusion Skin:      Warm and dry; no rash Neuro: alert and oriented x 3 Psych: normal mood and affect  Data Reviewed:  Reviewed labs, radiology imaging, old records and pertinent past GI work up   Assessment and Plan/Recommendations:  71 year old female here with complaints of nausea, intermittent vomiting, regurgitation and epigastric/right upper quadrant abdominal pain. The patient complains of 35 pound weight loss based on chart review she may have lost 7 pounds in past 2 years. We'll schedule for EGD to exclude peptic ulcer disease or any other upper GI pathology  The risks and benefits as well as alternatives of endoscopic procedure(s) have been discussed and reviewed. All questions answered. The patient agrees to proceed. Patient is status post cholecystectomy, obtain abdominal ultrasound to evaluate liver, CBD and pancreas Patient's symptoms appear mostly functional or secondary to recent change in medication with exacerbation of underlying IBS Trial of IB Gard 1 capsule upto three times daily as needed   K. Denzil Magnuson , MD [phone] Mon-Fri 8a-5p [phone] after 5p, weekends, holidays  CC: Maisie Fus, MD

## 2016-12-09 ENCOUNTER — Ambulatory Visit (AMBULATORY_SURGERY_CENTER): Payer: 59 | Admitting: Gastroenterology

## 2016-12-09 ENCOUNTER — Encounter: Payer: Self-pay | Admitting: Gastroenterology

## 2016-12-09 VITALS — BP 168/83 | HR 78 | Temp 98.9°F | Resp 14 | Ht 63.0 in | Wt 135.0 lb

## 2016-12-09 DIAGNOSIS — K2970 Gastritis, unspecified, without bleeding: Secondary | ICD-10-CM

## 2016-12-09 DIAGNOSIS — R112 Nausea with vomiting, unspecified: Secondary | ICD-10-CM | POA: Diagnosis present

## 2016-12-09 DIAGNOSIS — K2990 Gastroduodenitis, unspecified, without bleeding: Secondary | ICD-10-CM

## 2016-12-09 MED ORDER — SODIUM CHLORIDE 0.9 % IV SOLN: 500.0000 mL | INTRAVENOUS | Status: DC

## 2016-12-09 MED ORDER — OMEPRAZOLE 40 MG PO CPDR: 40.0000 mg | DELAYED_RELEASE_CAPSULE | Freq: Two times a day (BID) | ORAL | 1 refills | Status: DC

## 2016-12-09 NOTE — Patient Instructions (Signed)
AVOID NSAIDS**   *HANDOUT GIVEN ON GASTRITIS* USE PRILOSEC (OMEPRAZOLE) 40MG  BID FOR TWO MONTHS  YOU HAD AN ENDOSCOPIC PROCEDURE TODAY AT [facility]:   Refer to the procedure report that was given to you for any specific questions about what was found during the examination.  If the procedure report does not answer your questions, please call your gastroenterologist to clarify.  If you requested that your care partner not be given the details of your procedure findings, then the procedure report has been included in a sealed envelope for you to review at your convenience later.  YOU SHOULD EXPECT: Some feelings of bloating in the abdomen. Passage of more gas than usual.  Walking can help get rid of the air that was put into your GI tract during the procedure and reduce the bloating. If you had a lower endoscopy (such as a colonoscopy or flexible sigmoidoscopy) you may notice spotting of blood in your stool or on the toilet paper. If you underwent a bowel prep for your procedure, you may not have a normal bowel movement for a few days.  Please Note:  You might notice some irritation and congestion in your nose or some drainage.  This is from the oxygen used during your procedure.  There is no need for concern and it should clear up in a day or so.  SYMPTOMS TO REPORT IMMEDIATELY:   Following upper endoscopy (EGD)  Vomiting of blood or coffee ground material  New chest pain or pain under the shoulder blades  Painful or persistently difficult swallowing  New shortness of breath  Fever of 100F or higher  Black, tarry-looking stools  For urgent or emergent issues, a gastroenterologist can be reached at any hour by calling [phone].   DIET:  We do recommend a small meal at first, but then you may proceed to your regular diet.  Drink plenty of fluids but you should avoid alcoholic beverages for 24 hours.  ACTIVITY:  You should plan to take it easy for the rest of today and  you should NOT DRIVE or use heavy machinery until tomorrow (because of the sedation medicines used during the test).    FOLLOW UP: Our staff will call the number listed on your records the next business day following your procedure to check on you and address any questions or concerns that you may have regarding the information given to you following your procedure. If we do not reach you, we will leave a message.  However, if you are feeling well and you are not experiencing any problems, there is no need to return our call.  We will assume that you have returned to your regular daily activities without incident.  If any biopsies were taken you will be contacted by phone or by letter within the next 1-3 weeks.  Please call us at [phone] if you have not heard about the biopsies in 3 weeks.    SIGNATURES/CONFIDENTIALITY: You and/or your care partner have signed paperwork which will be entered into your electronic medical record.  These signatures attest to the fact that that the information above on your After Visit Summary has been reviewed and is understood.  Full responsibility of the confidentiality of this discharge information lies with you and/or your care-partner.

## 2016-12-09 NOTE — Progress Notes (Signed)
Called to room to assist during endoscopic procedure.  Patient ID and intended procedure confirmed with present staff. Received instructions for my participation in the procedure from the performing physician.  

## 2016-12-09 NOTE — Progress Notes (Signed)
Pt's states no medical or surgical changes since previsit or office visit. 

## 2016-12-09 NOTE — Progress Notes (Signed)
Report to PACU, RN, vss, BBS= Clear.  

## 2016-12-09 NOTE — Op Note (Signed)
[facility] Patient Name: Mindy Young Procedure Date: 12/09/2016 2:28 PM MRN: [id number] Endoscopist: Mauri Pole , MD Age: 71 Referring MD:  Date of Birth: 08/08/1945 Gender: Female Account #: [id number] Procedure:                Upper GI endoscopy Indications:              Persistent vomiting of unknown cause, Epigastric                            abdominal pain Medicines:                Monitored Anesthesia Care Procedure:                Pre-Anesthesia Assessment:                           - Prior to the procedure, a History and Physical                            was performed, and patient medications and                            allergies were reviewed. The patient's tolerance of                            previous anesthesia was also reviewed. The risks                            and benefits of the procedure and the sedation                            options and risks were discussed with the patient.                            All questions were answered, and informed consent                            was obtained. Prior Anticoagulants: The patient has                            taken no previous anticoagulant or antiplatelet                            agents. ASA Grade Assessment: II - A patient with                            mild systemic disease. After reviewing the risks                            and benefits, the patient was deemed in                            satisfactory condition to undergo the procedure.  After obtaining informed consent, the endoscope was                            passed under direct vision. Throughout the                            procedure, the patient's blood pressure, pulse, and                            oxygen saturations were monitored continuously. The                            Model GIF-HQ190 [id number]) scope was introduced                            through the mouth, and advanced to the  second part                            of duodenum. The upper GI endoscopy was                            accomplished without difficulty. The patient                            tolerated the procedure well. Scope In: Scope Out: Findings:                 A small hiatal hernia was present. Slight                            irregularity at Z-line 35cm otherwise no gross                            lesions                           Scattered severe inflammation with hemorrhage                            characterized by adherent blood, congestion                            (edema), erosions, erythema, friability,                            granularity and mucus was found in the entire                            examined stomach. Biopsies were taken with a cold                            forceps for Helicobacter pylori testing.                           Mildly scalloped mucosa was found in the second  portion of the duodenum. Biopsies for histology                            were taken with a cold forceps for evaluation of                            celiac disease. Complications:            No immediate complications. Estimated Blood Loss:     Estimated blood loss was minimal. Impression:               - Small hiatal hernia.                           - Gastritis with hemorrhage. Biopsied.                           - Scalloped mucosa was found in the duodenum, rule                            out celiac disease. Biopsied. Recommendation:           - Patient has a contact number available for                            emergencies. The signs and symptoms of potential                            delayed complications were discussed with the                            patient. Return to normal activities tomorrow.                            Written discharge instructions were provided to the                            patient.                           - Resume previous  diet.                           - Continue present medications.                           - No aspirin, ibuprofen, naproxen, or other                            non-steroidal anti-inflammatory drugs.                           - Await pathology results.                           - Use Prilosec (omeprazole) 40 mg PO BID for 2  months. Mauri Pole, MD 12/09/2016 2:49:33 PM This report has been signed electronically.

## 2016-12-12 ENCOUNTER — Telehealth: Payer: Self-pay

## 2016-12-12 NOTE — Telephone Encounter (Signed)
Left message

## 2016-12-13 ENCOUNTER — Ambulatory Visit (HOSPITAL_COMMUNITY)
Admission: RE | Admit: 2016-12-13 | Discharge: 2016-12-13 | Disposition: A | Discharge status: Home / Self Care | Payer: 59 | Source: Ambulatory Visit | Attending: Gastroenterology | Admitting: Gastroenterology

## 2016-12-13 DIAGNOSIS — R112 Nausea with vomiting, unspecified: Secondary | ICD-10-CM

## 2016-12-13 DIAGNOSIS — R634 Abnormal weight loss: Secondary | ICD-10-CM | POA: Insufficient documentation

## 2016-12-13 DIAGNOSIS — K588 Other irritable bowel syndrome: Secondary | ICD-10-CM

## 2016-12-13 DIAGNOSIS — Z9049 Acquired absence of other specified parts of digestive tract: Secondary | ICD-10-CM | POA: Insufficient documentation

## 2016-12-22 ENCOUNTER — Ambulatory Visit: Payer: 59 | Admitting: Nurse Practitioner

## 2016-12-28 ENCOUNTER — Encounter: Payer: Self-pay | Admitting: Gastroenterology

## 2016-12-29 ENCOUNTER — Telehealth: Payer: Self-pay | Admitting: Gastroenterology

## 2016-12-29 ENCOUNTER — Other Ambulatory Visit: Payer: Self-pay

## 2016-12-29 DIAGNOSIS — R1030 Lower abdominal pain, unspecified: Secondary | ICD-10-CM

## 2016-12-29 NOTE — Telephone Encounter (Signed)
I discussed the results of her biopsy with the patient. She will come for labs as soon as she is able. Her daughter was in a MVA. She is taking care of her right now and she is out of town. She will avoid all NSAIDS.

## 2017-01-03 ENCOUNTER — Ambulatory Visit: Payer: 59 | Admitting: Nurse Practitioner

## 2017-05-26 ENCOUNTER — Telehealth: Payer: Self-pay | Admitting: Gastroenterology

## 2017-05-26 MED ORDER — OMEPRAZOLE 40 MG PO CPDR: 40.0000 mg | DELAYED_RELEASE_CAPSULE | Freq: Two times a day (BID) | ORAL | 3 refills | Status: DC

## 2017-05-26 NOTE — Telephone Encounter (Signed)
Refilled omeprazole and called and informed patient

## 2017-05-29 ENCOUNTER — Encounter: Payer: Self-pay | Admitting: *Deleted

## 2017-05-29 ENCOUNTER — Telehealth: Payer: Self-pay | Admitting: *Deleted

## 2017-05-29 NOTE — Telephone Encounter (Signed)
Submitted prior auth today through cover my meds

## 2017-07-19 ENCOUNTER — Other Ambulatory Visit: Payer: Self-pay | Admitting: Obstetrics & Gynecology

## 2017-10-16 ENCOUNTER — Other Ambulatory Visit: Payer: Self-pay | Admitting: Obstetrics & Gynecology

## 2017-10-16 DIAGNOSIS — R928 Other abnormal and inconclusive findings on diagnostic imaging of breast: Secondary | ICD-10-CM

## 2017-10-17 ENCOUNTER — Other Ambulatory Visit: Payer: Self-pay | Admitting: Obstetrics & Gynecology

## 2017-10-17 ENCOUNTER — Ambulatory Visit
Admission: RE | Admit: 2017-10-17 | Discharge: 2017-10-17 | Disposition: A | Discharge status: Home / Self Care | Payer: Managed Care, Other (non HMO) | Source: Ambulatory Visit | Attending: Obstetrics & Gynecology | Admitting: Obstetrics & Gynecology

## 2017-10-17 DIAGNOSIS — R928 Other abnormal and inconclusive findings on diagnostic imaging of breast: Secondary | ICD-10-CM

## 2017-10-17 DIAGNOSIS — N6310 Unspecified lump in the right breast, unspecified quadrant: Secondary | ICD-10-CM

## 2017-10-19 ENCOUNTER — Ambulatory Visit
Admission: RE | Admit: 2017-10-19 | Discharge: 2017-10-19 | Disposition: A | Discharge status: Home / Self Care | Payer: Managed Care, Other (non HMO) | Source: Ambulatory Visit | Attending: Obstetrics & Gynecology | Admitting: Obstetrics & Gynecology

## 2017-10-19 DIAGNOSIS — N6310 Unspecified lump in the right breast, unspecified quadrant: Secondary | ICD-10-CM

## 2017-10-24 ENCOUNTER — Telehealth: Payer: Self-pay | Admitting: Hematology and Oncology

## 2017-10-24 ENCOUNTER — Other Ambulatory Visit: Payer: Managed Care, Other (non HMO)

## 2017-10-24 ENCOUNTER — Encounter: Payer: Self-pay | Admitting: *Deleted

## 2017-10-24 ENCOUNTER — Other Ambulatory Visit: Payer: Self-pay | Admitting: *Deleted

## 2017-10-24 DIAGNOSIS — C50311 Malignant neoplasm of lower-inner quadrant of right female breast: Secondary | ICD-10-CM | POA: Insufficient documentation

## 2017-10-24 DIAGNOSIS — Z170 Estrogen receptor positive status [ER+]: Principal | ICD-10-CM

## 2017-10-24 NOTE — Telephone Encounter (Signed)
Spoke with patient to confirm afternoon [facility] appointment for 9/4, packet emailed to patient

## 2017-10-25 ENCOUNTER — Inpatient Hospital Stay: Payer: Managed Care, Other (non HMO)

## 2017-10-25 ENCOUNTER — Inpatient Hospital Stay: Payer: Managed Care, Other (non HMO) | Attending: Hematology and Oncology | Admitting: Hematology and Oncology

## 2017-10-25 ENCOUNTER — Other Ambulatory Visit: Payer: Self-pay

## 2017-10-25 ENCOUNTER — Other Ambulatory Visit: Payer: Self-pay | Admitting: General Surgery

## 2017-10-25 ENCOUNTER — Encounter: Payer: Self-pay | Admitting: Physical Therapy

## 2017-10-25 ENCOUNTER — Encounter: Payer: Self-pay | Admitting: Hematology and Oncology

## 2017-10-25 ENCOUNTER — Encounter: Payer: Self-pay | Admitting: General Practice

## 2017-10-25 ENCOUNTER — Ambulatory Visit
Admission: RE | Admit: 2017-10-25 | Discharge: 2017-10-25 | Disposition: A | Discharge status: Home / Self Care | Payer: Medicare Other | Source: Ambulatory Visit | Attending: Radiation Oncology | Admitting: Radiation Oncology

## 2017-10-25 ENCOUNTER — Ambulatory Visit: Payer: Managed Care, Other (non HMO) | Attending: General Surgery | Admitting: Physical Therapy

## 2017-10-25 DIAGNOSIS — Z170 Estrogen receptor positive status [ER+]: Principal | ICD-10-CM

## 2017-10-25 DIAGNOSIS — Z85820 Personal history of malignant melanoma of skin: Secondary | ICD-10-CM | POA: Insufficient documentation

## 2017-10-25 DIAGNOSIS — C50311 Malignant neoplasm of lower-inner quadrant of right female breast: Secondary | ICD-10-CM

## 2017-10-25 DIAGNOSIS — Z79899 Other long term (current) drug therapy: Secondary | ICD-10-CM | POA: Diagnosis not present

## 2017-10-25 DIAGNOSIS — K219 Gastro-esophageal reflux disease without esophagitis: Secondary | ICD-10-CM | POA: Insufficient documentation

## 2017-10-25 DIAGNOSIS — F17210 Nicotine dependence, cigarettes, uncomplicated: Secondary | ICD-10-CM | POA: Diagnosis not present

## 2017-10-25 DIAGNOSIS — I10 Essential (primary) hypertension: Secondary | ICD-10-CM | POA: Diagnosis not present

## 2017-10-25 DIAGNOSIS — F329 Major depressive disorder, single episode, unspecified: Secondary | ICD-10-CM | POA: Insufficient documentation

## 2017-10-25 DIAGNOSIS — M1990 Unspecified osteoarthritis, unspecified site: Secondary | ICD-10-CM | POA: Diagnosis not present

## 2017-10-25 DIAGNOSIS — F419 Anxiety disorder, unspecified: Secondary | ICD-10-CM | POA: Insufficient documentation

## 2017-10-25 DIAGNOSIS — R293 Abnormal posture: Secondary | ICD-10-CM | POA: Diagnosis present

## 2017-10-25 LAB — CBC WITH DIFFERENTIAL (CANCER CENTER ONLY)
BASOS ABS: 0 10*3/uL (ref 0.0–0.1)
BASOS PCT: 1 %
EOS PCT: 1 %
Eosinophils Absolute: 0.1 10*3/uL (ref 0.0–0.5)
HCT: 37.9 % (ref 34.8–46.6)
Hemoglobin: 12.9 g/dL (ref 11.6–15.9)
Lymphocytes Relative: 21 %
Lymphs Abs: 1.5 10*3/uL (ref 0.9–3.3)
MCH: 34.4 pg — ABNORMAL HIGH (ref 25.1–34.0)
MCHC: 34.1 g/dL (ref 31.5–36.0)
MCV: 101.1 fL — AB (ref 79.5–101.0)
Monocytes Absolute: 0.8 10*3/uL (ref 0.1–0.9)
Monocytes Relative: 11 %
Neutro Abs: 4.7 10*3/uL (ref 1.5–6.5)
Neutrophils Relative %: 66 %
PLATELETS: 290 10*3/uL (ref 145–400)
RBC: 3.75 MIL/uL (ref 3.70–5.45)
RDW: 13.3 % (ref 11.2–14.5)
WBC Count: 7 10*3/uL (ref 3.9–10.3)

## 2017-10-25 LAB — CMP (CANCER CENTER ONLY)
ALBUMIN: 3.4 g/dL — AB (ref 3.5–5.0)
ALT: 10 U/L (ref 0–44)
AST: 17 U/L (ref 15–41)
Alkaline Phosphatase: 74 U/L (ref 38–126)
Anion gap: 9 (ref 5–15)
BUN: 18 mg/dL (ref 8–23)
CHLORIDE: 102 mmol/L (ref 98–111)
CO2: 27 mmol/L (ref 22–32)
CREATININE: 0.84 mg/dL (ref 0.44–1.00)
Calcium: 8.9 mg/dL (ref 8.9–10.3)
GFR, Est AFR Am: >60 mL/min (ref >60)
GFR, Estimated: >60 mL/min (ref >60)
GLUCOSE: 91 mg/dL (ref 70–99)
POTASSIUM: 4.3 mmol/L (ref 3.5–5.1)
Sodium: 138 mmol/L (ref 135–145)
Total Bilirubin: 0.4 mg/dL (ref 0.3–1.2)
Total Protein: 7 g/dL (ref 6.5–8.1)

## 2017-10-25 NOTE — Progress Notes (Signed)
[facility] NOTE  Patient Care Team: Lennie Odor, PA-C as PCP - General (Nurse Practitioner) Rolm Bookbinder, MD as Consulting Physician (General Surgery) Nicholas Lose, MD as Consulting Physician (Hematology and Oncology) Kyung Rudd, MD as Consulting Physician (Radiation Oncology)  CHIEF COMPLAINTS/PURPOSE OF CONSULTATION:  Newly diagnosed breast cancer  HISTORY OF PRESENTING ILLNESS:  Mindy Young 72 y.o. female is here because of recent diagnosis of right breast cancer.  Patient had a routine screening mammogram that detected a right breast mass at 5:30 position measuring 0.4 cm.  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  She was presented this morning to the multidisciplinary tumor board and she is here today accompanied by her friend to discuss her treatment plan.  She has a therapy dog which came along with her.  Apparently it identifies with the patient is getting malignant hypertension.  I reviewed her records extensively and collaborated the history with the patient.  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 Allergy   . Anemia 06/29/2011  . Anxiety   . Arthritis   . C1 cervical fracture ([place])   . Cancer ([place])    melanoma  . Depression   . GERD (gastroesophageal reflux disease)   . Hypertension     SURGICAL HISTORY: Past Surgical History:  Procedure Laterality Date  . AUGMENTATION MAMMAPLASTY    . CHOLECYSTECTOMY    . collarbone    . INCONTINENCE SURGERY    . KNEE SURGERY     removal of cyst,  repair of cartiledge  . LAPAROSCOPY     for endometriosis  . ROTATOR CUFF REPAIR     left    SOCIAL HISTORY: Social History   Socioeconomic History  . Marital status: Married    Spouse name: Not on file  . Number of children: 2  . Years of education: Not on file  . Highest education level: Not on file  Occupational History  . Not on file  Social Needs  . Financial resource strain: Not on file  . Food insecurity:    Worry: Not on file    Inability: Not on file  . Transportation needs:    Medical: Not on file    Non-medical: Not on file  Tobacco Use  . Smoking status: Current Every Day Smoker    Packs/day: 2.00    Types: Cigarettes  . Smokeless tobacco: Never Used  Substance and Sexual Activity  . Alcohol use: Yes    Alcohol/week: 21.0 standard drinks    Types: 21 Standard drinks or equivalent per week    Comment: 1-2 drinks daily (mixed vodka drinks); smokes e-cigs  . Drug use: No  . Sexual activity: Not on file  Lifestyle  . Physical activity:    Days per week: Not on file    Minutes per session: Not on file  . Stress: Not on file  Relationships  . Social connections:    Talks on phone: Not on file    Gets together: Not on file    Attends religious  service: Not on file    Active member of club or organization: Not on file    Attends meetings of clubs or organizations: Not on file    Relationship status: Not on file  . Intimate partner violence:    Fear of current or ex partner: Not on file    Emotionally abused: Not on file    Physically abused: Not on file    Forced sexual activity: Not on file  Other Topics Concern  . Not on file  Social History Narrative  . Not on file    FAMILY HISTORY: Family History  Problem Relation Age of Onset  . COPD Mother   . Prostate cancer Father   . Colon polyps Father   . Breast cancer Sister   . Colon cancer Neg Hx     ALLERGIES:  is allergic to percocet [oxycodone-acetaminophen].  MEDICATIONS:  Current  Outpatient Medications  Medication Sig Dispense Refill  . ALPRAZolam (XANAX) 0.5 MG tablet Take 0.5 mg by mouth 2 (two) times daily as needed. For anxiety    . amLODipine (NORVASC) 5 MG tablet Take 5 mg by mouth daily.    Marland Kitchen BYSTOLIC 20 MG TABS Take 2 tablets by mouth daily.  0  . cetirizine (ZYRTEC) 10 MG tablet Take 10 mg by mouth as needed for allergies.    . cloNIDine (CATAPRES) 0.1 MG tablet Take 1 tablet by mouth 2 (two) times daily.  0  . methylphenidate (RITALIN) 10 MG tablet Take 1 tablet by mouth daily.  0  . omeprazole (PRILOSEC) 40 MG capsule Take 1 capsule (40 mg total) by mouth 2 (two) times daily. 66mn before breakfast and 30 min before dinner 180 capsule 3  . ondansetron (ZOFRAN) 8 MG tablet Take 1 tablet by mouth daily.  0  . oxybutynin (DITROPAN-XL) 10 MG 24 hr tablet Take 1 tablet by mouth daily.  0  . PREMPRO 0.625-2.5 MG per tablet Take 1 tablet by mouth Daily.     . TRINTELLIX 20 MG TABS Take 1 tablet by mouth daily.  0   No current facility-administered medications for this visit.     REVIEW OF SYSTEMS:   Constitutional: Denies fevers, chills or abnormal night sweats Eyes: Denies blurriness of vision, double vision or watery eyes Ears, nose, mouth, throat, and face: Denies mucositis or sore throat Respiratory: Denies cough, dyspnea or wheezes Cardiovascular: Denies palpitation, chest discomfort or lower extremity swelling Gastrointestinal:  Denies nausea, heartburn or change in bowel habits Skin: Denies abnormal skin rashes Lymphatics: Denies new lymphadenopathy or easy bruising Neurological:Denies numbness, tingling or new weaknesses Behavioral/Psych: Mood is stable, no new changes  Breast:  Denies any palpable lumps or discharge All other systems were reviewed with the patient and are negative.  PHYSICAL EXAMINATION: ECOG PERFORMANCE STATUS: 1 - Symptomatic but completely ambulatory  Vitals:   10/25/17 1247  BP: 136/86  Pulse: 84  Resp: 17  Temp: 97.8  F (36.6 C)  SpO2: 98%   There were no vitals filed for this visit.  GENERAL:alert, no distress and comfortable SKIN: skin color, texture, turgor are normal, no rashes or significant lesions EYES: normal, conjunctiva are pink and non-injected, sclera clear OROPHARYNX:no exudate, no erythema and lips, buccal mucosa, and tongue normal  NECK: supple, thyroid normal size, non-tender, without nodularity LYMPH:  no palpable lymphadenopathy in the cervical, axillary or inguinal LUNGS: clear to auscultation and percussion with normal breathing effort HEART: regular rate & rhythm and no murmurs and no lower extremity edema  ABDOMEN:abdomen soft, non-tender and normal bowel sounds Musculoskeletal:no cyanosis of digits and no clubbing  PSYCH: alert & oriented x 3 with fluent speech NEURO: no focal motor/sensory deficits BREAST: No palpable nodules in breast. No palpable axillary or supraclavicular lymphadenopathy (exam performed in the presence of a chaperone)   LABORATORY DATA:  I have reviewed the data as listed Lab Results  Component Value Date   WBC 7.0 10/25/2017   HGB 12.9 10/25/2017   HCT 37.9 10/25/2017   MCV 101.1 (H) 10/25/2017   PLT 290 10/25/2017   Lab Results  Component Value Date   NA 138 10/25/2017   K 4.3 10/25/2017   CL 102 10/25/2017   CO2 27 10/25/2017    RADIOGRAPHIC STUDIES: I have personally reviewed the radiological reports and agreed with the findings in the report.  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 After reviewing the pathology in detail, we proceeded to discuss the different treatment options between surgery, radiation, and antiestrogen therapies.  Recommendations: 1. Breast conserving surgery followed by 2. Adjuvant radiation therapy followed by 3. Adjuvant antiestrogen therapy Genetic testing Return to clinic after surgery    All questions were answered. The patient knows to call the clinic with any problems, questions or concerns.    Harriette Ohara, MD 10/25/17

## 2017-10-25 NOTE — Progress Notes (Signed)
Nutrition Assessment  Reason for Assessment:  Pt seen in Breast Clinic  ASSESSMENT:   72 year old female with new diagnosis of breast cancer.  Past medical history of gastritis, HTN.    Patient reports normal appetite.    Medications:  reviewed  Labs: reviewed  Anthropometrics:   Height: 63 inches Weight: no wt taken today Reports stable weight   NUTRITION DIAGNOSIS: Food and nutrition related knowledge deficit related to new diagnosis of breast cancer as evidenced by no prior need for nutrition related information.  INTERVENTION:   Discussed and provided packet of information regarding nutritional tips for breast cancer patients.  Questions answered.  Teachback method used.  Contact information provided and patient knows to contact me with questions/concerns.    MONITORING, EVALUATION, and GOAL: Pt will consume a healthy plant based diet to maintain lean body mass throughout treatment.   Kimeka Badour B. Zenia Resides, [place], [place] Registered Dietitian [phone] (pager)
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
## 2017-10-25 NOTE — Assessment & Plan Note (Signed)
10/19/2017:Screening detected right breast mass at 5:30 position measuring 0.4 cm, axilla negative, biopsy revealed grade 1-2 invasive ductal carcinoma ER 90%, PR 100%, Ki-67 15%, T1 a N0 stage Ia AJCC 8  Pathology and radiology counseling:Discussed with the patient, the details of pathology including the type of breast cancer,the clinical staging, the significance of ER, PR and HER-2/neu receptors and the implications for treatment. After reviewing the pathology in detail, we proceeded to discuss the different treatment options between surgery, radiation, and antiestrogen therapies.  Recommendations: 1. Breast conserving surgery followed by 2. Adjuvant radiation therapy followed by 3. Adjuvant antiestrogen therapy Genetic testing Return to clinic after surgery

## 2017-10-25 NOTE — Patient Instructions (Signed)

## 2017-10-25 NOTE — Progress Notes (Signed)
Radiation Oncology         ([phone]) [phone] ________________________________  Name: Mindy Young        MRN: [id number]  Date of Service: 10/25/2017 DOB: 1945-10-19  CW:[id number], Barth Kirks, PA-C  Rolm Bookbinder, MD     REFERRING PHYSICIAN: Rolm Bookbinder, MD   DIAGNOSIS: The encounter diagnosis was Malignant neoplasm of lower-inner quadrant of right breast of female, estrogen receptor positive ([place]).   HISTORY OF PRESENT ILLNESS: Mindy Young is a 72 y.o. female seen in the multidisciplinary breast clinic for a new diagnosis of right breast cancer. The patient was noted to have a screening detected right breast mass. She underwent diagnostic imaging which located a 4 x 4 x 4 mm tumor at 5:30. Her axilla was negative for adenopathy. She underwent a biopsy on 10/19/17 which revealed a grade 1 invasive ductal carcinoma, ER/PR positive, HER2 negative, and a Ki 67 of 15%. She comes today to discuss options of treatment for her cancer.    PREVIOUS RADIATION THERAPY: No   PAST MEDICAL HISTORY:  Past Medical History:  Diagnosis Date  . Allergy   . Anemia 06/29/2011  . Anxiety   . Arthritis   . C1 cervical fracture ([place])   . Cancer ([place])    melanoma  . Depression   . GERD (gastroesophageal reflux disease)   . Hypertension        PAST SURGICAL HISTORY: Past Surgical History:  Procedure Laterality Date  . AUGMENTATION MAMMAPLASTY    . CHOLECYSTECTOMY    . collarbone    . INCONTINENCE SURGERY    . KNEE SURGERY     removal of cyst, repair of cartiledge  . LAPAROSCOPY     for endometriosis  . ROTATOR CUFF REPAIR     left     FAMILY HISTORY:  Family History  Problem Relation Age of Onset  . COPD Mother   . Prostate cancer Father   . Colon polyps Father   . Breast cancer Sister   . Colon cancer Neg Hx      SOCIAL HISTORY:  reports that she has quit smoking. Her smoking use included cigarettes. She smoked 2.00 packs per day. She has never used smokeless tobacco. She  reports that she drinks about 21.0 standard drinks of alcohol per week. She reports that she does not use drugs. The patient is married and lives in [place]. 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 She has a Neurosurgeon, [place] who notifies her of when her blood pressure elevates.   ALLERGIES: Percocet [oxycodone-acetaminophen]   MEDICATIONS:  Current Outpatient Medications  Medication Sig Dispense Refill  . ALPRAZolam (XANAX) 0.5 MG tablet Take 0.5 mg by mouth 2 (two) times daily as needed. For anxiety    . BYSTOLIC 20 MG TABS Take 2 tablets by mouth daily.  0  . cloNIDine (CATAPRES) 0.1 MG tablet Take 1 tablet by mouth 2 (two) times daily.  0  . methylphenidate (RITALIN) 10 MG tablet Take 1 tablet by mouth daily.  0  . omeprazole (PRILOSEC) 40 MG capsule Take 1 capsule (40 mg total) by mouth 2 (two) times daily. 30mn before breakfast and 30 min before dinner 180 capsule 3  . ondansetron (ZOFRAN) 8 MG tablet Take 1 tablet by mouth daily.  0  . oxybutynin (DITROPAN-XL) 10 MG 24 hr tablet Take 1 tablet by mouth daily.  0  . PREMPRO 0.625-2.5 MG per tablet Take 1 tablet by mouth Daily.     . TRINTELLIX 20 MG TABS  Take 1 tablet by mouth daily.  0   No current facility-administered medications for this encounter.      REVIEW OF SYSTEMS: On review of systems, the patient reports that she is doing well overall. She denies any chest pain, shortness of breath, cough, fevers, chills, night sweats, unintended weight changes. She denies any bowel or bladder disturbances, and denies abdominal pain, nausea or vomiting. She denies any new musculoskeletal or joint aches or pains. A complete review of systems is obtained and is otherwise negative.     PHYSICAL EXAM:  Wt Readings from Last 3 Encounters:  12/09/16 135 lb (61.2 kg)  12/08/16 135 lb 9.6 oz (61.5 kg)  10/23/14 142 lb (64.4 kg)   Temp Readings from Last 3 Encounters:  10/25/17 97.8 F (36.6 C) (Oral)  12/09/16 98.9 F (37.2 C) (Temporal)    04/10/15 97.6 F (36.4 C) (Oral)   BP Readings from Last 3 Encounters:  10/25/17 136/86  12/09/16 (!) 168/83  12/08/16 (!) 140/100   Pulse Readings from Last 3 Encounters:  10/25/17 84  12/09/16 78  12/08/16 80     In general this is a well appearing caucasian female in no acute distress. She is alert and oriented x4 and appropriate throughout the examination. HEENT reveals that the patient is normocephalic, atraumatic. EOMs are intact.  Skin is intact without any evidence of gross lesions. Cardiovascular exam reveals a regular rate and rhythm, no clicks rubs or murmurs are auscultated. Chest is clear to auscultation bilaterally. Lymphatic assessment is performed and does not reveal any adenopathy in the cervical, supraclavicular, axillary, or inguinal chains. Bilateral breast exam is performed and reveals bilateral in situ large implants and ecchymosis along the right breast without palpable mass. the left breast is assessed as well and no mass is noted. She has well healed areolar incisions, and no nipple bleeding or discharge is noted. Abdomen has active bowel sounds in all quadrants and is intact. The abdomen is soft, non tender, non distended. Lower extremities are evaluated and her right is negative for pretibial pitting edema, deep calf tenderness, cyanosis or clubbing. The left dorsal foot has 1+ edema that does not extend cephalad.   ECOG = 0  0 - Asymptomatic (Fully active, able to carry on all predisease activities without restriction)  1 - Symptomatic but completely ambulatory (Restricted in physically strenuous activity but ambulatory and able to carry out work of a light or sedentary nature. For example, light housework, office work)  2 - Symptomatic, <50% in bed during the day (Ambulatory and capable of all self care but unable to carry out any work activities. Up and about more than 50% of waking hours)  3 - Symptomatic, >50% in bed, but not bedbound (Capable of only  limited self-care, confined to bed or chair 50% or more of waking hours)  4 - Bedbound (Completely disabled. Cannot carry on any self-care. Totally confined to bed or chair)  5 - Death   Eustace Pen MM, Creech RH, Tormey DC, et al. [phone]). "Toxicity and response criteria of the [facility]". [place] Oncol. 5 (6): 649-55    LABORATORY DATA:  Lab Results  Component Value Date   WBC 7.0 10/25/2017   HGB 12.9 10/25/2017   HCT 37.9 10/25/2017   MCV 101.1 (H) 10/25/2017   PLT 290 10/25/2017   Lab Results  Component Value Date   NA 138 10/25/2017   K 4.3 10/25/2017   CL 102 10/25/2017   CO2  27 10/25/2017   Lab Results  Component Value Date   ALT 10 10/25/2017   AST 17 10/25/2017   ALKPHOS 74 10/25/2017   BILITOT 0.4 10/25/2017      RADIOGRAPHY: US Breast Ltd Uni Right Inc Axilla  Result Date: 10/17/2017 CLINICAL DATA:  Two asymmetry seen in the right breast on most recent screening mammography. History of augmentation mammoplasty. EXAM: DIGITAL DIAGNOSTIC RIGHT MAMMOGRAM WITH CAD AND TOMO ULTRASOUND RIGHT BREAST COMPARISON:  Previous exam(s). ACR Breast Density Category b: There are scattered areas of fibroglandular density. FINDINGS: Additional mammographic views of the right breast demonstrate interval effacement of previously noted asymmetry in the lateral right breast, middle depth. It resumes the appearance of postsurgical scarring. In the right breast lower inner quadrant, posterior depth, there is a persistent spiculated nodule with a few associated interspersed calcifications. The abnormality measures approximately 5 mm mammographically. Mammographic images were processed with CAD. On physical exam, no suspicious masses are palpated. Targeted ultrasound is performed, showing right breast 5:30 o'clock 6 cm from the nipple hypoechoic slightly irregular nodule measuring 0.4 x 0.4 x 0.4 cm. This finding corresponds to the mammographically seen spiculated nodule.  No evidence of right axillary lymphadenopathy. IMPRESSION: Right breast 5 o'clock indeterminate nodule, for which ultrasound-guided core needle biopsy is recommended. No evidence of right axillary lymphadenopathy. RECOMMENDATION: Ultrasound-guided core needle biopsy of the right breast 5:30 o'clock nodule. I have discussed the findings and recommendations with the patient. Results were also provided in writing at the conclusion of the visit. If applicable, a reminder letter will be sent to the patient regarding the next appointment. BI-RADS CATEGORY  4: Suspicious. Electronically Signed   By: Fidela Salisbury M.D.   On: 10/17/2017 13:46   Mm Diag Breast Tomo Uni Right  Result Date: 10/17/2017 CLINICAL DATA:  Two asymmetry seen in the right breast on most recent screening mammography. History of augmentation mammoplasty. EXAM: DIGITAL DIAGNOSTIC RIGHT MAMMOGRAM WITH CAD AND TOMO ULTRASOUND RIGHT BREAST COMPARISON:  Previous exam(s). ACR Breast Density Category b: There are scattered areas of fibroglandular density. FINDINGS: Additional mammographic views of the right breast demonstrate interval effacement of previously noted asymmetry in the lateral right breast, middle depth. It resumes the appearance of postsurgical scarring. In the right breast lower inner quadrant, posterior depth, there is a persistent spiculated nodule with a few associated interspersed calcifications. The abnormality measures approximately 5 mm mammographically. Mammographic images were processed with CAD. On physical exam, no suspicious masses are palpated. Targeted ultrasound is performed, showing right breast 5:30 o'clock 6 cm from the nipple hypoechoic slightly irregular nodule measuring 0.4 x 0.4 x 0.4 cm. This finding corresponds to the mammographically seen spiculated nodule. No evidence of right axillary lymphadenopathy. IMPRESSION: Right breast 5 o'clock indeterminate nodule, for which ultrasound-guided core needle biopsy is  recommended. No evidence of right axillary lymphadenopathy. RECOMMENDATION: Ultrasound-guided core needle biopsy of the right breast 5:30 o'clock nodule. I have discussed the findings and recommendations with the patient. Results were also provided in writing at the conclusion of the visit. If applicable, a reminder letter will be sent to the patient regarding the next appointment. BI-RADS CATEGORY  4: Suspicious. Electronically Signed   By: Fidela Salisbury M.D.   On: 10/17/2017 13:46   Mm Clip Placement Right  Result Date: 10/19/2017 CLINICAL DATA:  Ultrasound-guided core needle biopsy was performed a mass in the lower inner quadrant of the right breast. EXAM: DIAGNOSTIC RIGHT MAMMOGRAM POST ULTRASOUND BIOPSY COMPARISON:  Previous exam(s). FINDINGS: Mammographic images were  obtained following ultrasound guided biopsy of a mass at 5:30 position in the right breast 6 cm from the nipple. A ribbon shaped biopsy clip is satisfactorily positioned within the mass. IMPRESSION: Satisfactory position of ribbon shaped biopsy clip. Final Assessment: Post Procedure Mammograms for Marker Placement Electronically Signed   By: Curlene Dolphin M.D.   On: 10/19/2017 14:49   [place] Rt Breast Bx W Loc Dev 1st Lesion Img Bx Spec US Guide  Addendum Date: 10/20/2017   ADDENDUM REPORT: 10/20/2017 11:29 ADDENDUM: Pathology revealed GRADE I-II INVASIVE DUCTAL CARCINOMA, DUCTAL CARCINOMA IN SITU of the Right breast, 5:30 o'clock, 6 cm fn. This was found to be concordant by Dr. Curlene Dolphin. Pathology results were discussed with the patient by telephone. The patient reported doing well after the biopsy with tenderness at the site. Post biopsy instructions and care were reviewed and questions were answered. The patient was encouraged to call [facility] for any additional concerns. The patient was referred to [facility] on October 25, 2017. Pathology results reported by Terie Purser, RN on 10/20/2017. Electronically Signed   By: Curlene Dolphin M.D.   On: 10/20/2017 11:29   Result Date: 10/20/2017 CLINICAL DATA:  72 year old patient presents for ultrasound-guided core needle biopsy of the right breast mass at 5:30 position 6 cm from the nipple. EXAM: ULTRASOUND GUIDED RIGHT BREAST CORE NEEDLE BIOPSY COMPARISON:  Previous exam(s). FINDINGS: I met with the patient and we discussed the procedure of ultrasound-guided biopsy, including benefits and alternatives. We discussed the high likelihood of a successful procedure. We discussed the risks of the procedure, including infection, bleeding, tissue injury, clip migration, and inadequate sampling. Informed written consent was given. The usual time-out protocol was performed immediately prior to the procedure. Lesion quadrant: Lower inner quadrant Using sterile technique and 1% Lidocaine as local anesthetic, under direct ultrasound visualization, a 14 gauge spring-loaded device was used to perform biopsy of a hypoechoic mass at 4:30 position using a lateral approach. At the conclusion of the procedure a ribbon shaped tissue marker clip was deployed into the biopsy cavity. Follow up 2 view mammogram was performed and dictated separately. IMPRESSION: Ultrasound guided biopsy of the right breast. No apparent complications. Electronically Signed: By: Curlene Dolphin M.D. On: 10/19/2017 15:32       IMPRESSION/PLAN: 1. 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 Dr. Lisbeth Renshaw discusses the pathology findings and reviews the nature of invasive breast disease. The consensus from the breast conference includes breast conservation with lumpectomy with or without sentinel node biopsy. She would actually be interested in mastectomy. We did discuss that if she had lumpectomy however, she would benefit from adjuvant treatment to reduce the risk of recurrence. We  discussed the options to consider external radiotherapy to the breast followed by antiestrogen therapy. We discussed the risks, benefits, short, and long term effects of radiotherapy. Dr. Lisbeth Renshaw discusses the delivery and logistics of radiotherapy and anticipates a course of 6 1/2 weeks of radiotherapy given her in situ breast implants. That being said, at the end of the meeting, she's really desiring mastectomy. We will see her back as needed based on her final pathology and outlined scenarios in which we would consider postmastectomy radiotherapy 2. Possible genetic predisposition to malignancy. The patient is a candidate for genetic testing given her personal and family history. She was offered referral and she is interested. 3. LLE  edema and history of HTN. She was encouraged to follow up with her PCP, or be evaluated urgently if her symptoms progressed.    The above documentation reflects my direct findings during this shared patient visit. Please see the separate note by Dr. Lisbeth Renshaw on this date for the remainder of the patient's plan of care.    Carola Rhine, PAC

## 2017-10-26 DIAGNOSIS — Z170 Estrogen receptor positive status [ER+]: Secondary | ICD-10-CM | POA: Diagnosis not present

## 2017-10-26 DIAGNOSIS — C50311 Malignant neoplasm of lower-inner quadrant of right female breast: Secondary | ICD-10-CM | POA: Diagnosis present

## 2017-10-26 DIAGNOSIS — F17210 Nicotine dependence, cigarettes, uncomplicated: Secondary | ICD-10-CM | POA: Diagnosis not present

## 2017-10-26 DIAGNOSIS — Z79899 Other long term (current) drug therapy: Secondary | ICD-10-CM | POA: Diagnosis not present

## 2017-10-26 DIAGNOSIS — F329 Major depressive disorder, single episode, unspecified: Secondary | ICD-10-CM | POA: Diagnosis not present

## 2017-10-26 DIAGNOSIS — I10 Essential (primary) hypertension: Secondary | ICD-10-CM | POA: Diagnosis not present

## 2017-10-26 DIAGNOSIS — F419 Anxiety disorder, unspecified: Secondary | ICD-10-CM | POA: Diagnosis not present

## 2017-10-26 DIAGNOSIS — M1990 Unspecified osteoarthritis, unspecified site: Secondary | ICD-10-CM | POA: Diagnosis not present

## 2017-10-26 DIAGNOSIS — Z85820 Personal history of malignant melanoma of skin: Secondary | ICD-10-CM | POA: Diagnosis not present

## 2017-10-26 DIAGNOSIS — K219 Gastro-esophageal reflux disease without esophagitis: Secondary | ICD-10-CM | POA: Diagnosis not present

## 2017-10-26 NOTE — Progress Notes (Signed)
[facility] Psychosocial Distress Screening Spiritual Care  Accompanied by [facility] counseling intern Doris Cheadle, met with Mindy Young, a close friend, and niece in Breast Multidisciplinary Clinic to introduce [facility] team/resources, reviewing distress screen per protocol.  The patient scored a 8 on the Psychosocial Distress Thermometer which indicates severe distress. Also assessed for distress and other psychosocial needs.   ONCBCN DISTRESS SCREENING 10/26/2017  Screening Type Initial Screening  Distress experienced in past week (1-10) 8  Emotional problem type Depression;Nervousness/Anxiety  Spiritual/Religous concerns type Facing my mortality  Information Concerns Type Lack of info about diagnosis;Lack of info about treatment  Referral to support programs Yes    Per pt, she has great support, and distress is considerably lower after meeting team and determining plan of care at [facility]. Per pt, no other concerns at this time.  Follow up needed: No. Mindy Young is aware of ongoing [facility] team/programming availability, has full packet of [place], and knows that chaplain is available for spiritual concerns, including living and dying, should she wish to connect in more detail. Please also page if needs arise or circumstances change. Thank you.   [place], [place], [facility] Pager [phone] Voicemail [phone]

## 2017-10-27 ENCOUNTER — Other Ambulatory Visit: Payer: Self-pay

## 2017-10-27 ENCOUNTER — Encounter (HOSPITAL_BASED_OUTPATIENT_CLINIC_OR_DEPARTMENT_OTHER): Payer: Self-pay | Admitting: *Deleted

## 2017-10-30 ENCOUNTER — Other Ambulatory Visit: Payer: Self-pay | Admitting: General Surgery

## 2017-10-30 DIAGNOSIS — Z170 Estrogen receptor positive status [ER+]: Principal | ICD-10-CM

## 2017-10-30 DIAGNOSIS — C50311 Malignant neoplasm of lower-inner quadrant of right female breast: Secondary | ICD-10-CM

## 2017-10-31 ENCOUNTER — Ambulatory Visit
Admission: RE | Admit: 2017-10-31 | Discharge: 2017-10-31 | Disposition: A | Discharge status: Home / Self Care | Payer: Managed Care, Other (non HMO) | Source: Ambulatory Visit | Attending: General Surgery | Admitting: General Surgery

## 2017-10-31 ENCOUNTER — Encounter (HOSPITAL_BASED_OUTPATIENT_CLINIC_OR_DEPARTMENT_OTHER)
Admission: RE | Admit: 2017-10-31 | Discharge: 2017-10-31 | Disposition: A | Discharge status: Home / Self Care | Payer: Managed Care, Other (non HMO) | Source: Ambulatory Visit | Attending: General Surgery | Admitting: General Surgery

## 2017-10-31 DIAGNOSIS — Z170 Estrogen receptor positive status [ER+]: Principal | ICD-10-CM

## 2017-10-31 DIAGNOSIS — C50311 Malignant neoplasm of lower-inner quadrant of right female breast: Secondary | ICD-10-CM

## 2017-10-31 DIAGNOSIS — Z01810 Encounter for preprocedural cardiovascular examination: Secondary | ICD-10-CM | POA: Insufficient documentation

## 2017-10-31 MED ORDER — ENSURE PRE-SURGERY PO LIQD: 296.0000 mL | Freq: Once | ORAL | Status: DC

## 2017-10-31 NOTE — Progress Notes (Signed)
Anesthesia consult per Dr. Conrad Serenada, will proceed with surgery as scheduled. Pt states, "My tongue was numb after surgery in May and now the numbness is gone but it still feels thick" Dr. Conrad Renick encouraged pt to call the anesthesia group that performed her surgery back in may.    Ensure pre surgery drink given with instructions to complete by [place], surgical soap given with instructions, pt verbalized understanding.

## 2017-11-02 ENCOUNTER — Ambulatory Visit (HOSPITAL_BASED_OUTPATIENT_CLINIC_OR_DEPARTMENT_OTHER)
Admission: RE | Admit: 2017-11-02 | Discharge: 2017-11-02 | Disposition: A | Discharge status: Home / Self Care | Payer: Managed Care, Other (non HMO) | Source: Ambulatory Visit | Attending: General Surgery | Admitting: General Surgery

## 2017-11-02 ENCOUNTER — Encounter (HOSPITAL_BASED_OUTPATIENT_CLINIC_OR_DEPARTMENT_OTHER): Payer: Self-pay | Admitting: Anesthesiology

## 2017-11-02 ENCOUNTER — Ambulatory Visit (HOSPITAL_COMMUNITY): Payer: Managed Care, Other (non HMO)

## 2017-11-02 ENCOUNTER — Encounter: Payer: Self-pay | Admitting: Radiation Oncology

## 2017-11-02 ENCOUNTER — Encounter (HOSPITAL_BASED_OUTPATIENT_CLINIC_OR_DEPARTMENT_OTHER)
Admission: RE | Disposition: A | Discharge status: Home / Self Care | Payer: Self-pay | Source: Ambulatory Visit | Attending: General Surgery

## 2017-11-02 ENCOUNTER — Ambulatory Visit
Admission: RE | Admit: 2017-11-02 | Discharge: 2017-11-02 | Disposition: A | Discharge status: Home / Self Care | Payer: Managed Care, Other (non HMO) | Source: Ambulatory Visit | Attending: General Surgery | Admitting: General Surgery

## 2017-11-02 ENCOUNTER — Ambulatory Visit (HOSPITAL_COMMUNITY)
Admission: RE | Admit: 2017-11-02 | Discharge: 2017-11-02 | Disposition: A | Discharge status: Home / Self Care | Payer: Managed Care, Other (non HMO) | Source: Ambulatory Visit | Attending: General Surgery | Admitting: General Surgery

## 2017-11-02 ENCOUNTER — Ambulatory Visit (HOSPITAL_BASED_OUTPATIENT_CLINIC_OR_DEPARTMENT_OTHER): Payer: Managed Care, Other (non HMO) | Admitting: Anesthesiology

## 2017-11-02 ENCOUNTER — Other Ambulatory Visit: Payer: Self-pay | Admitting: *Deleted

## 2017-11-02 ENCOUNTER — Other Ambulatory Visit: Payer: Self-pay

## 2017-11-02 DIAGNOSIS — F329 Major depressive disorder, single episode, unspecified: Secondary | ICD-10-CM | POA: Diagnosis not present

## 2017-11-02 DIAGNOSIS — I10 Essential (primary) hypertension: Secondary | ICD-10-CM | POA: Diagnosis not present

## 2017-11-02 DIAGNOSIS — F17200 Nicotine dependence, unspecified, uncomplicated: Secondary | ICD-10-CM | POA: Diagnosis not present

## 2017-11-02 DIAGNOSIS — Z79899 Other long term (current) drug therapy: Secondary | ICD-10-CM | POA: Insufficient documentation

## 2017-11-02 DIAGNOSIS — Z170 Estrogen receptor positive status [ER+]: Secondary | ICD-10-CM | POA: Insufficient documentation

## 2017-11-02 DIAGNOSIS — F419 Anxiety disorder, unspecified: Secondary | ICD-10-CM | POA: Insufficient documentation

## 2017-11-02 DIAGNOSIS — C50311 Malignant neoplasm of lower-inner quadrant of right female breast: Secondary | ICD-10-CM

## 2017-11-02 DIAGNOSIS — C50211 Malignant neoplasm of upper-inner quadrant of right female breast: Secondary | ICD-10-CM | POA: Diagnosis present

## 2017-11-02 DIAGNOSIS — K219 Gastro-esophageal reflux disease without esophagitis: Secondary | ICD-10-CM | POA: Diagnosis not present

## 2017-11-02 HISTORY — PX: BREAST LUMPECTOMY: SHX2

## 2017-11-02 HISTORY — PX: BREAST LUMPECTOMY WITH RADIOACTIVE SEED AND SENTINEL LYMPH NODE BIOPSY: SHX6550

## 2017-11-02 SURGERY — BREAST LUMPECTOMY WITH RADIOACTIVE SEED AND SENTINEL LYMPH NODE BIOPSY
Anesthesia: General | Site: Breast | Laterality: Right

## 2017-11-02 MED ORDER — SCOPOLAMINE 1 MG/3DAYS TD PT72: 1.0000 | MEDICATED_PATCH | Freq: Once | TRANSDERMAL | Status: DC | PRN

## 2017-11-02 MED ORDER — CLONIDINE HCL (ANALGESIA) 100 MCG/ML EP SOLN
EPIDURAL | Status: DC | PRN
  Administered 2017-11-02: 50 ug

## 2017-11-02 MED ORDER — ACETAMINOPHEN 500 MG PO TABS
ORAL_TABLET | ORAL | Status: AC
  Filled 2017-11-02: qty 2

## 2017-11-02 MED ORDER — ACETAMINOPHEN 500 MG PO TABS
1000.0000 mg | ORAL_TABLET | ORAL | Status: AC
  Administered 2017-11-02: 1000 mg via ORAL

## 2017-11-02 MED ORDER — BUPIVACAINE HCL (PF) 0.25 % IJ SOLN
INTRAMUSCULAR | Status: DC | PRN
  Administered 2017-11-02: 7 mL

## 2017-11-02 MED ORDER — HYDROMORPHONE HCL 1 MG/ML IJ SOLN
INTRAMUSCULAR | Status: AC
  Filled 2017-11-02: qty 0.5

## 2017-11-02 MED ORDER — DEXAMETHASONE SODIUM PHOSPHATE 4 MG/ML IJ SOLN
INTRAMUSCULAR | Status: DC | PRN
  Administered 2017-11-02: 10 mg via INTRAVENOUS

## 2017-11-02 MED ORDER — EPHEDRINE SULFATE 50 MG/ML IJ SOLN
INTRAMUSCULAR | Status: DC | PRN
  Administered 2017-11-02 (×3): 10 mg via INTRAVENOUS

## 2017-11-02 MED ORDER — GABAPENTIN 100 MG PO CAPS
ORAL_CAPSULE | ORAL | Status: AC
  Filled 2017-11-02: qty 1

## 2017-11-02 MED ORDER — ONDANSETRON HCL 4 MG/2ML IJ SOLN
INTRAMUSCULAR | Status: DC | PRN
  Administered 2017-11-02: 4 mg via INTRAVENOUS

## 2017-11-02 MED ORDER — LIDOCAINE HCL (CARDIAC) PF 100 MG/5ML IV SOSY
PREFILLED_SYRINGE | INTRAVENOUS | Status: DC | PRN
  Administered 2017-11-02: 80 mg via INTRAVENOUS

## 2017-11-02 MED ORDER — MIDAZOLAM HCL 2 MG/2ML IJ SOLN
1.0000 mg | INTRAMUSCULAR | Status: DC | PRN
  Administered 2017-11-02 (×2): 1 mg via INTRAVENOUS

## 2017-11-02 MED ORDER — LIDOCAINE 2% (20 MG/ML) 5 ML SYRINGE
INTRAMUSCULAR | Status: AC
  Filled 2017-11-02: qty 5

## 2017-11-02 MED ORDER — PROPOFOL 10 MG/ML IV BOLUS
INTRAVENOUS | Status: DC | PRN
  Administered 2017-11-02: 150 mg via INTRAVENOUS

## 2017-11-02 MED ORDER — ONDANSETRON HCL 4 MG/2ML IJ SOLN: 4.0000 mg | Freq: Once | INTRAMUSCULAR | Status: DC | PRN

## 2017-11-02 MED ORDER — EPHEDRINE 5 MG/ML INJ
INTRAVENOUS | Status: AC
  Filled 2017-11-02: qty 10

## 2017-11-02 MED ORDER — CEFAZOLIN SODIUM-DEXTROSE 2-4 GM/100ML-% IV SOLN
INTRAVENOUS | Status: AC
  Filled 2017-11-02: qty 100

## 2017-11-02 MED ORDER — MEPERIDINE HCL 25 MG/ML IJ SOLN: 6.2500 mg | INTRAMUSCULAR | Status: DC | PRN

## 2017-11-02 MED ORDER — ONDANSETRON HCL 4 MG/2ML IJ SOLN
INTRAMUSCULAR | Status: AC
  Filled 2017-11-02: qty 2

## 2017-11-02 MED ORDER — LACTATED RINGERS IV SOLN
INTRAVENOUS | Status: DC
  Administered 2017-11-02 (×2): via INTRAVENOUS

## 2017-11-02 MED ORDER — FENTANYL CITRATE (PF) 100 MCG/2ML IJ SOLN
INTRAMUSCULAR | Status: AC
  Filled 2017-11-02: qty 2

## 2017-11-02 MED ORDER — BUPIVACAINE-EPINEPHRINE (PF) 0.5% -1:200000 IJ SOLN
INTRAMUSCULAR | Status: DC | PRN
  Administered 2017-11-02: 30 mL via PERINEURAL

## 2017-11-02 MED ORDER — CEFAZOLIN SODIUM-DEXTROSE 2-4 GM/100ML-% IV SOLN
2.0000 g | INTRAVENOUS | Status: AC
  Administered 2017-11-02: 2 g via INTRAVENOUS

## 2017-11-02 MED ORDER — MIDAZOLAM HCL 2 MG/2ML IJ SOLN
INTRAMUSCULAR | Status: AC
  Filled 2017-11-02: qty 2

## 2017-11-02 MED ORDER — FENTANYL CITRATE (PF) 100 MCG/2ML IJ SOLN
50.0000 ug | INTRAMUSCULAR | Status: DC | PRN
  Administered 2017-11-02 (×2): 50 ug via INTRAVENOUS

## 2017-11-02 MED ORDER — PROPOFOL 10 MG/ML IV BOLUS
INTRAVENOUS | Status: AC
  Filled 2017-11-02: qty 20

## 2017-11-02 MED ORDER — FENTANYL CITRATE (PF) 100 MCG/2ML IJ SOLN
INTRAMUSCULAR | Status: DC | PRN
  Administered 2017-11-02: 100 ug via INTRAVENOUS

## 2017-11-02 MED ORDER — DEXAMETHASONE SODIUM PHOSPHATE 10 MG/ML IJ SOLN
INTRAMUSCULAR | Status: AC
  Filled 2017-11-02: qty 1

## 2017-11-02 MED ORDER — HYDROMORPHONE HCL 1 MG/ML IJ SOLN
0.2500 mg | INTRAMUSCULAR | Status: DC | PRN
  Administered 2017-11-02: 0.5 mg via INTRAVENOUS

## 2017-11-02 MED ORDER — GABAPENTIN 100 MG PO CAPS
100.0000 mg | ORAL_CAPSULE | ORAL | Status: AC
  Administered 2017-11-02: 100 mg via ORAL

## 2017-11-02 MED ORDER — TRAMADOL HCL 50 MG PO TABS: 100.0000 mg | ORAL_TABLET | Freq: Four times a day (QID) | ORAL | 0 refills | Status: DC | PRN

## 2017-11-02 MED ORDER — TECHNETIUM TC 99M SULFUR COLLOID FILTERED
1.0000 | Freq: Once | INTRAVENOUS | Status: AC | PRN
  Administered 2017-11-02: 1 via INTRADERMAL

## 2017-11-02 SURGICAL SUPPLY — 56 items
APPLIER CLIP 9.375 MED OPEN (MISCELLANEOUS)
BINDER BREAST LRG (GAUZE/BANDAGES/DRESSINGS) IMPLANT
BINDER BREAST MEDIUM (GAUZE/BANDAGES/DRESSINGS) IMPLANT
BINDER BREAST XLRG (GAUZE/BANDAGES/DRESSINGS) IMPLANT
BINDER BREAST XXLRG (GAUZE/BANDAGES/DRESSINGS) IMPLANT
BLADE SURG 15 STRL LF DISP TIS (BLADE) ×1 IMPLANT
BLADE SURG 15 STRL SS (BLADE) ×1
CANISTER SUC SOCK COL 7IN (MISCELLANEOUS) IMPLANT
CANISTER SUCT 1200ML W/VALVE (MISCELLANEOUS) IMPLANT
CHLORAPREP W/TINT 26ML (MISCELLANEOUS) ×2 IMPLANT
CLIP APPLIE 9.375 MED OPEN (MISCELLANEOUS) IMPLANT
CLIP VESOCCLUDE SM WIDE 6/CT (CLIP) ×2 IMPLANT
COVER BACK TABLE 60X90IN (DRAPES) ×2 IMPLANT
COVER MAYO STAND STRL (DRAPES) ×2 IMPLANT
COVER PROBE W GEL 5X96 (DRAPES) ×2 IMPLANT
DECANTER SPIKE VIAL GLASS SM (MISCELLANEOUS) IMPLANT
DERMABOND ADVANCED (GAUZE/BANDAGES/DRESSINGS) ×1
DERMABOND ADVANCED .7 DNX12 (GAUZE/BANDAGES/DRESSINGS) ×1 IMPLANT
DEVICE DUBIN W/COMP PLATE 8390 (MISCELLANEOUS) ×2 IMPLANT
DRAPE LAPAROSCOPIC ABDOMINAL (DRAPES) ×2 IMPLANT
DRAPE UTILITY XL STRL (DRAPES) ×2 IMPLANT
ELECT COATED BLADE 2.86 ST (ELECTRODE) ×2 IMPLANT
ELECT REM PT RETURN 9FT ADLT (ELECTROSURGICAL) ×2
ELECTRODE REM PT RTRN 9FT ADLT (ELECTROSURGICAL) ×1 IMPLANT
GLOVE BIO SURGEON STRL SZ7 (GLOVE) ×4 IMPLANT
GLOVE BIOGEL PI IND STRL 7.5 (GLOVE) ×1 IMPLANT
GLOVE BIOGEL PI INDICATOR 7.5 (GLOVE) ×1
GOWN STRL REUS W/ TWL LRG LVL3 (GOWN DISPOSABLE) ×2 IMPLANT
GOWN STRL REUS W/TWL LRG LVL3 (GOWN DISPOSABLE) ×2
HEMOSTAT ARISTA ABSORB 3G PWDR (MISCELLANEOUS) IMPLANT
ILLUMINATOR WAVEGUIDE N/F (MISCELLANEOUS) IMPLANT
KIT MARKER MARGIN INK (KITS) ×2 IMPLANT
LIGHT WAVEGUIDE WIDE FLAT (MISCELLANEOUS) IMPLANT
NDL SAFETY ECLIPSE 18X1.5 (NEEDLE) IMPLANT
NEEDLE HYPO 18GX1.5 SHARP (NEEDLE)
NEEDLE HYPO 25X1 1.5 SAFETY (NEEDLE) ×2 IMPLANT
NS IRRIG 1000ML POUR BTL (IV SOLUTION) IMPLANT
PACK BASIN DAY SURGERY FS (CUSTOM PROCEDURE TRAY) ×2 IMPLANT
PENCIL BUTTON HOLSTER BLD 10FT (ELECTRODE) ×2 IMPLANT
SLEEVE SCD COMPRESS KNEE MED (MISCELLANEOUS) ×2 IMPLANT
SPONGE LAP 4X18 RFD (DISPOSABLE) ×2 IMPLANT
STRIP CLOSURE SKIN 1/2X4 (GAUZE/BANDAGES/DRESSINGS) ×2 IMPLANT
SUT ETHILON 2 0 FS 18 (SUTURE) IMPLANT
SUT MNCRL AB 4-0 PS2 18 (SUTURE) ×2 IMPLANT
SUT MON AB 5-0 PS2 18 (SUTURE) IMPLANT
SUT SILK 2 0 SH (SUTURE) IMPLANT
SUT VIC AB 2-0 SH 27 (SUTURE) ×1
SUT VIC AB 2-0 SH 27XBRD (SUTURE) ×1 IMPLANT
SUT VIC AB 3-0 SH 27 (SUTURE) ×1
SUT VIC AB 3-0 SH 27X BRD (SUTURE) ×1 IMPLANT
SUT VIC AB 5-0 PS2 18 (SUTURE) IMPLANT
SYR CONTROL 10ML LL (SYRINGE) ×2 IMPLANT
TOWEL GREEN STERILE FF (TOWEL DISPOSABLE) ×2 IMPLANT
TOWEL OR NON WOVEN STRL DISP B (DISPOSABLE) ×2 IMPLANT
TUBE CONNECTING 20X1/4 (TUBING) IMPLANT
YANKAUER SUCT BULB TIP NO VENT (SUCTIONS) IMPLANT

## 2017-11-02 NOTE — Anesthesia Postprocedure Evaluation (Signed)
Anesthesia Post Note  Patient: Mindy Young  Procedure(s) Performed: BREAST LUMPECTOMY WITH RADIOACTIVE SEED AND SENTINEL LYMPH NODE BIOPSY (Right Breast)     Patient location during evaluation: PACU Anesthesia Type: General Level of consciousness: awake and alert and oriented Pain management: pain level controlled Vital Signs Assessment: post-procedure vital signs reviewed and stable Respiratory status: spontaneous breathing, nonlabored ventilation and respiratory function stable Cardiovascular status: blood pressure returned to baseline and stable Postop Assessment: no apparent nausea or vomiting Anesthetic complications: no    Last Vitals:  Vitals:   11/02/17 1215 11/02/17 1230  BP: 137/88 134/75  Pulse: 88 84  Resp: 17 20  Temp:    SpO2: 100% 94%    Last Pain:  Vitals:   11/02/17 1200  TempSrc:   PainSc: 0-No pain                 Kerrilyn Azbill A.

## 2017-11-02 NOTE — Transfer of Care (Signed)
Immediate Anesthesia Transfer of Care Note  Patient: Mindy Young  Procedure(s) Performed: BREAST LUMPECTOMY WITH RADIOACTIVE SEED AND SENTINEL LYMPH NODE BIOPSY (Right Breast)  Patient Location: PACU  Anesthesia Type:GA combined with regional for post-op pain  Level of Consciousness: awake and patient cooperative  Airway & Oxygen Therapy: Patient Spontanous Breathing and Patient connected to face mask oxygen  Post-op Assessment: Report given to RN and Post -op Vital signs reviewed and stable  Post vital signs: Reviewed and stable  Last Vitals:  Vitals Value Taken Time  BP    Temp    Pulse 85 11/02/2017 11:59 AM  Resp    SpO2 100 % 11/02/2017 11:59 AM  Vitals shown include unvalidated device data.  Last Pain:  Vitals:   11/02/17 0948  TempSrc: Oral         Complications: No apparent anesthesia complications

## 2017-11-02 NOTE — Anesthesia Procedure Notes (Signed)
Procedure Name: LMA Insertion Date/Time: 11/02/2017 11:08 AM Performed by: Marrianne Mood, CRNA Pre-anesthesia Checklist: Patient identified, Emergency Drugs available, Suction available, Patient being monitored and Timeout performed Patient Re-evaluated:Patient Re-evaluated prior to induction Oxygen Delivery Method: Circle system utilized Preoxygenation: Pre-oxygenation with 100% oxygen Induction Type: IV induction Ventilation: Mask ventilation without difficulty LMA: LMA inserted LMA Size: 3.0 Number of attempts: 1 Airway Equipment and Method: Bite block Placement Confirmation: positive ETCO2 Tube secured with: Tape Dental Injury: Teeth and Oropharynx as per pre-operative assessment

## 2017-11-02 NOTE — H&P (Signed)
75 yof referred by Dr Dory Horn for new right breast cancer. she has history of breast augmentation by Dr Harlow Mares five years ago (replacing old implants)- she reports these are saline submuscular. she has fh in sister and niece. niece had panel testing that is negative. she had no mass or dc. she was noted on mm to have a right breast mass. on [place] this is a 4x4x4 mm mass and the axilla is negative. 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 she had tah/bso recently that was slow to recover from. she is on premarin now. she is here to discuss options   Past Surgical History Tawni Pummel, RN; 10/25/2017 7:30 AM) Breast Biopsy  Right. Cataract Surgery  Left. Gallbladder Surgery - Laparoscopic  Hysterectomy (not due to cancer) - Complete  Shoulder Surgery  Left.  Diagnostic Studies History Tawni Pummel, RN; 10/25/2017 7:30 AM) Colonoscopy  5-10 years ago Mammogram  within last year Pap Smear  1-5 years ago  Medication History Tawni Pummel, RN; 10/25/2017 7:31 AM) Medications Reconciled  Social History Tawni Pummel, RN; 10/25/2017 7:30 AM) Alcohol use  Occasional alcohol use. Caffeine use  Carbonated beverages. Tobacco use  Current every day smoker.  Family History Tawni Pummel, RN; 10/25/2017 7:30 AM) Breast Cancer  Family Members In General, Sister. Heart Disease  Father, Mother, Sister. Prostate Cancer  Father. Respiratory Condition  Mother.  Pregnancy / Birth History Tawni Pummel, RN; 10/25/2017 7:30 AM) Age at menarche  68 years. Age of menopause  <45 Contraceptive History  Oral contraceptives. Gravida  2 Irregular periods  Maternal age  70-25 Para  2  Other Problems Tawni Pummel, RN; 10/25/2017 7:30 AM) Anxiety Disorder  Bladder Problems  Cholelithiasis  Chronic Obstructive Lung Disease  Depression  Gastroesophageal Reflux Disease  High blood pressure  Melanoma    Review of Systems  Sunday Spillers Ledford RN; 10/25/2017 7:30 AM) General Not Present- Appetite Loss, Chills, Fatigue, Fever, Night Sweats, Weight Gain and Weight Loss. Skin Not Present- Change in Wart/Mole, Dryness, Hives, Jaundice, New Lesions, Non-Healing Wounds, Rash and Ulcer. HEENT Not Present- Earache, Hearing Loss, Hoarseness, Nose Bleed, Oral Ulcers, Ringing in the Ears, Seasonal Allergies, Sinus Pain, Sore Throat, Visual Disturbances, Wears glasses/contact lenses and Yellow Eyes. Respiratory Not Present- Bloody sputum, Chronic Cough, Difficulty Breathing, Snoring and Wheezing. Breast Not Present- Breast Mass, Breast Pain, Nipple Discharge and Skin Changes. Cardiovascular Present- Swelling of Extremities. Not Present- Chest Pain, Difficulty Breathing Lying Down, Leg Cramps, Palpitations, Rapid Heart Rate and Shortness of Breath. Gastrointestinal Not Present- Abdominal Pain, Bloating, Bloody Stool, Change in Bowel Habits, Chronic diarrhea, Constipation, Difficulty Swallowing, Excessive gas, Gets full quickly at meals, Hemorrhoids, Indigestion, Nausea, Rectal Pain and Vomiting. Female Genitourinary Present- Frequency and Urgency. Not Present- Nocturia, Painful Urination and Pelvic Pain. Musculoskeletal Not Present- Back Pain, Joint Pain, Joint Stiffness, Muscle Pain, Muscle Weakness and Swelling of Extremities. Neurological Not Present- Decreased Memory, Fainting, Headaches, Numbness, Seizures, Tingling, Tremor, Trouble walking and Weakness. Psychiatric Present- Anxiety and Depression. Not Present- Bipolar, Change in Sleep Pattern, Fearful and Frequent crying. Endocrine Not Present- Cold Intolerance, Excessive Hunger, Hair Changes, Heat Intolerance, Hot flashes and New Diabetes. Hematology Present- Easy Bruising. Not Present- Blood Thinners, Excessive bleeding, Gland problems, HIV and Persistent Infections.   Physical Exam Rolm Bookbinder MD; 10/25/2017 5:25 PM) General Mental Status-Alert.  Head and  Neck Trachea-midline. Thyroid Gland Characteristics - normal size and consistency.  Eye Sclera/Conjunctiva - Bilateral-No scleral icterus.  Chest and Lung Exam Chest and lung exam reveals -quiet, even and easy respiratory effort with no use of accessory muscles and on auscultation, normal breath sounds, no adventitious sounds and normal vocal resonance.  Breast Nipples-No Discharge. Breast Lump-No Palpable Breast Mass. Note: bilateral implants in place, well healed incisions   Cardiovascular Cardiovascular examination reveals -normal heart sounds, regular rate and rhythm with no murmurs.  Abdomen Note: soft nontender no hepatomegaly   Neurologic Neurologic evaluation reveals -alert and oriented x 3 with no impairment of recent or remote memory.  Lymphatic Head & Neck  General Head & Neck Lymphatics: Bilateral - Description - Normal. Axillary  General Axillary Region: Bilateral - Description - Normal. Note: no [place] adenopathy     Assessment & Plan Rolm Bookbinder MD; 10/25/2017 5:27 PM) BREAST CANCER OF UPPER-INNER QUADRANT OF RIGHT FEMALE BREAST (C50.211)  right breast seed guided lumpectomy, right axillary sn biopsy We discussed the staging and pathophysiology of breast cancer. We discussed all of the different options for treatment for breast cancer including surgery, chemotherapy, radiation therapy, Herceptin, and antiestrogen therapy. We discussed a sentinel lymph node biopsy as she does not appear to having lymph node involvement right now. We discussed the performance of that with injection of radioactive tracer. We discussed that there is a chance of having a positive node with a sentinel lymph node biopsy and we will await the permanent pathology to make any other first further decisions in terms of her treatment. We discussed up to a 5% risk lifetime of chronic shoulder pain as well as lymphedema associated with a sentinel lymph node biopsy. We  discussed the options for treatment of the breast cancer which included lumpectomy versus a mastectomy. We discussed the performance of the lumpectomy with radioactive seed placement. We discussed a 5-10% chance of a positive margin requiring reexcision in the operating room. We also discussed that she will likely need radiation therapy if she undergoes lumpectomy. The breast cannot undergo more radiation therapy in the same breast after lumpectomy in the future. We discussed mastectomy and the postoperative care for that as well. Mastectomy can be followed by reconstruction. The decision for lumpectomy vs mastectomy has no impact on decision for chemotherapy. Most mastectomy patients will not need radiation therapy. We discussed that there is no difference in her survival whether she undergoes lumpectomy with radiation therapy or antiestrogen therapy versus a mastectomy. There is also no real difference between her recurrence in the breast. We discussed the risks of operation including bleeding, infection, possible reoperation. She understands her further therapy will be based on what her stages at the time of her operation.

## 2017-11-02 NOTE — Anesthesia Preprocedure Evaluation (Signed)
Anesthesia Evaluation  Patient identified by MRN, date of birth, ID band Patient awake    Reviewed: Allergy & Precautions, NPO status , Patient's Chart, lab work & pertinent test results  Airway Mallampati: II  TM Distance: >3 FB Neck ROM: Full    Dental no notable dental hx. (+) Teeth Intact   Pulmonary Current Smoker,    Pulmonary exam normal breath sounds clear to auscultation       Cardiovascular hypertension, Pt. on medications Normal cardiovascular exam Rhythm:Regular Rate:Normal     Neuro/Psych PSYCHIATRIC DISORDERS Anxiety Depression negative neurological ROS     GI/Hepatic GERD  Medicated and Controlled,(+)     substance abuse  alcohol use,   Endo/Other  Right Breast Ca  Renal/GU negative Renal ROS  negative genitourinary   Musculoskeletal  (+) Arthritis ,   Abdominal   Peds  Hematology  (+) anemia ,   Anesthesia Other Findings   Reproductive/Obstetrics                             Anesthesia Physical Anesthesia Plan  ASA: II  Anesthesia Plan: General   Post-op Pain Management:  Regional for Post-op pain   Induction: Intravenous  PONV Risk Score and Plan: 3 and Midazolam, Ondansetron, Dexamethasone and Treatment may vary due to age or medical condition  Airway Management Planned: LMA  Additional Equipment:   Intra-op Plan:   Post-operative Plan: Extubation in OR  Informed Consent: I have reviewed the patients History and Physical, chart, labs and discussed the procedure including the risks, benefits and alternatives for the proposed anesthesia with the patient or authorized representative who has indicated his/her understanding and acceptance.   Dental advisory given  Plan Discussed with: Surgeon and CRNA  Anesthesia Plan Comments:         Anesthesia Quick Evaluation

## 2017-11-02 NOTE — Interval H&P Note (Signed)
History and Physical Interval Note:  11/02/2017 10:28 AM  Mindy Young  has presented today for surgery, with the diagnosis of RIGHT BREAST CANCER  The various methods of treatment have been discussed with the patient and family. After consideration of risks, benefits and other options for treatment, the patient has consented to  Procedure(s): BREAST LUMPECTOMY WITH RADIOACTIVE SEED AND SENTINEL LYMPH NODE BIOPSY (Right) as a surgical intervention .  The patient's history has been reviewed, patient examined, no change in status, stable for surgery.  I have reviewed the patient's chart and labs.  Questions were answered to the patient's satisfaction.     Rolm Bookbinder

## 2017-11-02 NOTE — Anesthesia Procedure Notes (Signed)
Anesthesia Regional Block: Pectoralis block   Pre-Anesthetic Checklist: ,, timeout performed, Correct Patient, Correct Site, Correct Laterality, Correct Procedure, Correct Position, site marked, Risks and benefits discussed,  Surgical consent,  Pre-op evaluation,  At surgeon's request and post-op pain management  Laterality: Right  Prep: chloraprep       Needles:  Injection technique: Single-shot  Needle Type: Echogenic Stimulator Needle     Needle Length: 10cm  Needle Gauge: 21   Needle insertion depth: 7 cm   Additional Needles:   Procedures:,,,, ultrasound used (permanent image in chart),,,,  Narrative:  Start time: 11/02/2017 10:25 AM End time: 11/02/2017 10:30 AM Injection made incrementally with aspirations every 5 mL.  Performed by: Personally  Anesthesiologist: Josephine Igo, MD  Additional Notes: Timeout performed. Patient sedated. Relevant anatomy ID'd using [place]. Incremental 2-96ml injection of LA with frequent aspiration. Patient tolerated procedure well.        Right Pectoralis Block

## 2017-11-02 NOTE — H&P (View-Only) (Signed)
72 yof referred by Dr Mindy Young for new right breast cancer. she has history of breast augmentation by Dr Harlow Mares five years ago (replacing old implants)- she reports these are saline submuscular. she has fh in sister and niece. niece had panel testing that is negative. she had no mass or dc. she was noted on mm to have a right breast mass. on [place] this is a 4x4x4 mm mass and the axilla is negative. 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 she had tah/bso recently that was slow to recover from. she is on premarin now. she is here to discuss options   Past Surgical History Tawni Pummel, RN; 10/25/2017 7:30 AM) Breast Biopsy  Right. Cataract Surgery  Left. Gallbladder Surgery - Laparoscopic  Hysterectomy (not due to cancer) - Complete  Shoulder Surgery  Left.  Diagnostic Studies History Tawni Pummel, RN; 10/25/2017 7:30 AM) Colonoscopy  5-10 years ago Mammogram  within last year Pap Smear  1-5 years ago  Medication History Tawni Pummel, RN; 10/25/2017 7:31 AM) Medications Reconciled  Social History Tawni Pummel, RN; 10/25/2017 7:30 AM) Alcohol use  Occasional alcohol use. Caffeine use  Carbonated beverages. Tobacco use  Current every day smoker.  Family History Tawni Pummel, RN; 10/25/2017 7:30 AM) Breast Cancer  Family Members In General, Sister. Heart Disease  Father, Mother, Sister. Prostate Cancer  Father. Respiratory Condition  Mother.  Pregnancy / Birth History Tawni Pummel, RN; 10/25/2017 7:30 AM) Age at menarche  55 years. Age of menopause  <45 Contraceptive History  Oral contraceptives. Gravida  2 Irregular periods  Maternal age  72-25 Para  2  Other Problems Tawni Pummel, RN; 10/25/2017 7:30 AM) Anxiety Disorder  Bladder Problems  Cholelithiasis  Chronic Obstructive Lung Disease  Depression  Gastroesophageal Reflux Disease  High blood pressure  Melanoma    Review of Systems  Sunday Spillers Ledford RN; 10/25/2017 7:30 AM) General Not Present- Appetite Loss, Chills, Fatigue, Fever, Night Sweats, Weight Gain and Weight Loss. Skin Not Present- Change in Wart/Mole, Dryness, Hives, Jaundice, New Lesions, Non-Healing Wounds, Rash and Ulcer. HEENT Not Present- Earache, Hearing Loss, Hoarseness, Nose Bleed, Oral Ulcers, Ringing in the Ears, Seasonal Allergies, Sinus Pain, Sore Throat, Visual Disturbances, Wears glasses/contact lenses and Yellow Eyes. Respiratory Not Present- Bloody sputum, Chronic Cough, Difficulty Breathing, Snoring and Wheezing. Breast Not Present- Breast Mass, Breast Pain, Nipple Discharge and Skin Changes. Cardiovascular Present- Swelling of Extremities. Not Present- Chest Pain, Difficulty Breathing Lying Down, Leg Cramps, Palpitations, Rapid Heart Rate and Shortness of Breath. Gastrointestinal Not Present- Abdominal Pain, Bloating, Bloody Stool, Change in Bowel Habits, Chronic diarrhea, Constipation, Difficulty Swallowing, Excessive gas, Gets full quickly at meals, Hemorrhoids, Indigestion, Nausea, Rectal Pain and Vomiting. Female Genitourinary Present- Frequency and Urgency. Not Present- Nocturia, Painful Urination and Pelvic Pain. Musculoskeletal Not Present- Back Pain, Joint Pain, Joint Stiffness, Muscle Pain, Muscle Weakness and Swelling of Extremities. Neurological Not Present- Decreased Memory, Fainting, Headaches, Numbness, Seizures, Tingling, Tremor, Trouble walking and Weakness. Psychiatric Present- Anxiety and Depression. Not Present- Bipolar, Change in Sleep Pattern, Fearful and Frequent crying. Endocrine Not Present- Cold Intolerance, Excessive Hunger, Hair Changes, Heat Intolerance, Hot flashes and New Diabetes. Hematology Present- Easy Bruising. Not Present- Blood Thinners, Excessive bleeding, Gland problems, HIV and Persistent Infections.   Physical Exam Rolm Bookbinder MD; 10/25/2017 5:25 PM) General Mental Status-Alert.  Head and  Neck Trachea-midline. Thyroid Gland Characteristics - normal size and consistency.  Eye Sclera/Conjunctiva - Bilateral-No scleral icterus.  Chest and Lung Exam Chest and lung exam reveals -quiet, even and easy respiratory effort with no use of accessory muscles and on auscultation, normal breath sounds, no adventitious sounds and normal vocal resonance.  Breast Nipples-No Discharge. Breast Lump-No Palpable Breast Mass. Note: bilateral implants in place, well healed incisions   Cardiovascular Cardiovascular examination reveals -normal heart sounds, regular rate and rhythm with no murmurs.  Abdomen Note: soft nontender no hepatomegaly   Neurologic Neurologic evaluation reveals -alert and oriented x 3 with no impairment of recent or remote memory.  Lymphatic Head & Neck  General Head & Neck Lymphatics: Bilateral - Description - Normal. Axillary  General Axillary Region: Bilateral - Description - Normal. Note: no [place] adenopathy     Assessment & Plan Rolm Bookbinder MD; 10/25/2017 5:27 PM) BREAST CANCER OF UPPER-INNER QUADRANT OF RIGHT FEMALE BREAST (C50.211)  right breast seed guided lumpectomy, right axillary sn biopsy We discussed the staging and pathophysiology of breast cancer. We discussed all of the different options for treatment for breast cancer including surgery, chemotherapy, radiation therapy, Herceptin, and antiestrogen therapy. We discussed a sentinel lymph node biopsy as she does not appear to having lymph node involvement right now. We discussed the performance of that with injection of radioactive tracer. We discussed that there is a chance of having a positive node with a sentinel lymph node biopsy and we will await the permanent pathology to make any other first further decisions in terms of her treatment. We discussed up to a 5% risk lifetime of chronic shoulder pain as well as lymphedema associated with a sentinel lymph node biopsy. We  discussed the options for treatment of the breast cancer which included lumpectomy versus a mastectomy. We discussed the performance of the lumpectomy with radioactive seed placement. We discussed a 5-10% chance of a positive margin requiring reexcision in the operating room. We also discussed that she will likely need radiation therapy if she undergoes lumpectomy. The breast cannot undergo more radiation therapy in the same breast after lumpectomy in the future. We discussed mastectomy and the postoperative care for that as well. Mastectomy can be followed by reconstruction. The decision for lumpectomy vs mastectomy has no impact on decision for chemotherapy. Most mastectomy patients will not need radiation therapy. We discussed that there is no difference in her survival whether she undergoes lumpectomy with radiation therapy or antiestrogen therapy versus a mastectomy. There is also no real difference between her recurrence in the breast. We discussed the risks of operation including bleeding, infection, possible reoperation. She understands her further therapy will be based on what her stages at the time of her operation.

## 2017-11-02 NOTE — Discharge Instructions (Signed)
[facility] °Office Phone Number [phone] ° °POST OP INSTRUCTIONS °Take 400 mg of ibuprofen every 8 hours or 650 mg tylenol every 6 hours for next 72 hours then as needed. Use ice several times daily also. °Always review your discharge instruction sheet given to you by the facility where your surgery was performed. ° °IF YOU HAVE DISABILITY OR FAMILY LEAVE FORMS, YOU MUST BRING THEM TO THE OFFICE FOR PROCESSING.  DO NOT GIVE THEM TO YOUR DOCTOR. ° °1. A prescription for pain medication may be given to you upon discharge.  Take your pain medication as prescribed, if needed.  If narcotic pain medicine is not needed, then you may take acetaminophen (Tylenol), naprosyn (Alleve) or ibuprofen (Advil) as needed. °2. Take your usually prescribed medications unless otherwise directed °3. If you need a refill on your pain medication, please contact your pharmacy.  They will contact our office to request authorization.  Prescriptions will not be filled after 5pm or on week-ends. °4. You should eat very light the first 24 hours after surgery, such as soup, crackers, pudding, etc.  Resume your normal diet the day after surgery. °5. Most patients will experience some swelling and bruising in the breast.  Ice packs and a good support bra will help.  Wear the breast binder provided or a sports bra for 72 hours day and night.  After that wear a sports bra during the day until you return to the office. Swelling and bruising can take several days to resolve.  °6. It is common to experience some constipation if taking pain medication after surgery.  Increasing fluid intake and taking a stool softener will usually help or prevent this problem from occurring.  A mild laxative (Milk of Magnesia or Miralax) should be taken according to package directions if there are no bowel movements after 48 hours. °7. Unless discharge instructions indicate otherwise, you may remove your bandages 48 hours after surgery and you may  shower at that time.  You may have steri-strips (small skin tapes) in place directly over the incision.  These strips should be left on the skin for 7-10 days and will come off on their own.  If your surgeon used skin glue on the incision, you may shower in 24 hours.  The glue will flake off over the next 2-3 weeks.  Any sutures or staples will be removed at the office during your follow-up visit. °8. ACTIVITIES:  You may resume regular daily activities (gradually increasing) beginning the next day.  Wearing a good support bra or sports bra minimizes pain and swelling.  You may have sexual intercourse when it is comfortable. °a. You may drive when you no longer are taking prescription pain medication, you can comfortably wear a seatbelt, and you can safely maneuver your car and apply brakes. °b. RETURN TO WORK:  ______________________________________________________________________________________ °9. You should see your doctor in the office for a follow-up appointment approximately two weeks after your surgery.  Your doctor’s nurse will typically make your follow-up appointment when she calls you with your pathology report.  Expect your pathology report 3-4 business days after your surgery.  You may call to check if you do not hear from us after three days. °10. OTHER INSTRUCTIONS: _______________________________________________________________________________________________ _____________________________________________________________________________________________________________________________________ °_____________________________________________________________________________________________________________________________________ °_____________________________________________________________________________________________________________________________________ ° °WHEN TO CALL DR WAKEFIELD: °1. Fever over 101.0 °2. Nausea and/or vomiting. °3. Extreme swelling or bruising. °4. Continued bleeding from  incision. °5. Increased pain, redness, or drainage from the incision. ° °The clinic staff is available to   answer your questions during regular business hours.  Please don’t hesitate to call and ask to speak to one of the nurses for clinical concerns.  If you have a medical emergency, go to the nearest emergency room or call 911.  A surgeon from [facility] is always on call at the hospital. ° °For further questions, please visit [email or web address] ° ° ° ° °Post Anesthesia Home Care Instructions ° °Activity: °Get plenty of rest for the remainder of the day. A responsible individual must stay with you for 24 hours following the procedure.  °For the next 24 hours, DO NOT: °-Drive a car °-Operate machinery °-Drink alcoholic beverages °-Take any medication unless instructed by your physician °-Make any legal decisions or sign important papers. ° °Meals: °Start with liquid foods such as gelatin or soup. Progress to regular foods as tolerated. Avoid greasy, spicy, heavy foods. If nausea and/or vomiting occur, drink only clear liquids until the nausea and/or vomiting subsides. Call your physician if vomiting continues. ° °Special Instructions/Symptoms: °Your throat may feel dry or sore from the anesthesia or the breathing tube placed in your throat during surgery. If this causes discomfort, gargle with warm salt water. The discomfort should disappear within 24 hours. ° °If you had a scopolamine patch placed behind your ear for the management of post- operative nausea and/or vomiting: ° °1. The medication in the patch is effective for 72 hours, after which it should be removed.  Wrap patch in a tissue and discard in the trash. Wash hands thoroughly with soap and water. °2. You may remove the patch earlier than 72 hours if you experience unpleasant side effects which may include dry mouth, dizziness or visual disturbances. °3. Avoid touching the patch. Wash your hands with soap and water after contact  with the patch. °   ° °

## 2017-11-02 NOTE — Op Note (Signed)
Preoperative diagnosis: Clinical stage I right breast cancer Postoperative diagnosis: Same as above Procedure: 1.  Right breast seed guided lumpectomy 2.  Right deep axillary sentinel lymph node biopsy Surgeon: Dr. Serita Grammes Anesthesia: General with a pectoral block Estimated blood loss: Less than 30 cc Specimens: 1.  Right breast tissue marked with paint 2.  Additional superior, medial and posterior margins marked short stitch superior, long stitch lateral, double stitch deep 3.  Right breast axillary sentinel lymph nodes with highest count of 127 Complications: None Drains: None Sponge needle count was correct at completion Disposition to recovery in stable condition  Indications: This is a healthy 72 year old female who is diagnosed on screening mammography with a clinical stage I right breast cancer.  We discussed all of her options and elected to proceed with a lumpectomy and axillary sentinel lymph node biopsy.  She had a seed placed prior to beginning.  I had these mammograms available in the operating room.  Procedure: After informed consent was obtained the patient first underwent a pectoral block.  She underwent injection of technetium in the standard periareolar fashion.  She was administered antibiotics.  SCDs were in place.  She was then placed under general anesthesia without complication.  She was prepped and draped in the standard sterile surgical fashion.  A surgical timeout was then performed.  I located the radioactive seed in the lower inner quadrant of the right breast.  The seed was very close to the skin.   I infiltrated Marcaine around the areola. I then made an elliptical incision to excise the skin overyling it.   I then used the neoprobe to guide the excision of the seed and the surrounding tissue with an attempt to get a clear margin.  Once I remove this I thought the seed was closer to 3 of the margins and I excised these.  Hemostasis was obtained.  I placed  some clips in the cavity.  I sutured this closed with 2-0 Vicryl.  I then closed the skin with 3-0 Vicryl and 5-0 Monocryl.  Glue and Steri-Strips were applied.  I then located the sentinel node in the low axilla.  I made an incision after infiltrating marcaine at the axillary hairline.  I dissected through the axillary fascia.  I then identified what appeared to be several sentinel lymph nodes.  The highest count as listed above.  There was no background radioactivity once I was completed.  There were no palpable lymph nodes either.  I then obtained hemostasis.  I closed down the axillary fascia with a 2-0 Vicryl.  The remaining tissue was closed with 3-0 Vicryl and 4-0 Monocryl.  Glue and Steri-Strips were applied as well.  She tolerated this well was extubated and transferred to the recovery room stable.

## 2017-11-03 ENCOUNTER — Encounter (HOSPITAL_BASED_OUTPATIENT_CLINIC_OR_DEPARTMENT_OTHER): Payer: Self-pay | Admitting: General Surgery

## 2017-11-06 ENCOUNTER — Telehealth: Payer: Self-pay | Admitting: *Deleted

## 2017-11-06 ENCOUNTER — Telehealth: Payer: Self-pay | Admitting: Hematology and Oncology

## 2017-11-06 NOTE — Telephone Encounter (Signed)
Left vm regarding [facility] from 9.4.19. Contact information provided for questions or needs.

## 2017-11-06 NOTE — Telephone Encounter (Signed)
Spoke to pt regarding upcoming appts per 9/12 sch message   °

## 2017-11-11 ENCOUNTER — Other Ambulatory Visit: Payer: Self-pay

## 2017-11-11 ENCOUNTER — Emergency Department (HOSPITAL_BASED_OUTPATIENT_CLINIC_OR_DEPARTMENT_OTHER): Payer: Managed Care, Other (non HMO)

## 2017-11-11 ENCOUNTER — Emergency Department (HOSPITAL_BASED_OUTPATIENT_CLINIC_OR_DEPARTMENT_OTHER)
Admission: EM | Admit: 2017-11-11 | Discharge: 2017-11-12 | Disposition: A | Discharge status: Home / Self Care | Payer: Managed Care, Other (non HMO) | Attending: Emergency Medicine | Admitting: Emergency Medicine

## 2017-11-11 ENCOUNTER — Encounter (HOSPITAL_BASED_OUTPATIENT_CLINIC_OR_DEPARTMENT_OTHER): Payer: Self-pay | Admitting: *Deleted

## 2017-11-11 DIAGNOSIS — Y999 Unspecified external cause status: Secondary | ICD-10-CM | POA: Diagnosis not present

## 2017-11-11 DIAGNOSIS — Y92007 Garden or yard of unspecified non-institutional (private) residence as the place of occurrence of the external cause: Secondary | ICD-10-CM | POA: Diagnosis not present

## 2017-11-11 DIAGNOSIS — S0990XA Unspecified injury of head, initial encounter: Secondary | ICD-10-CM

## 2017-11-11 DIAGNOSIS — T148XXA Other injury of unspecified body region, initial encounter: Secondary | ICD-10-CM

## 2017-11-11 DIAGNOSIS — Z23 Encounter for immunization: Secondary | ICD-10-CM | POA: Insufficient documentation

## 2017-11-11 DIAGNOSIS — Z853 Personal history of malignant neoplasm of breast: Secondary | ICD-10-CM | POA: Insufficient documentation

## 2017-11-11 DIAGNOSIS — S81802A Unspecified open wound, left lower leg, initial encounter: Secondary | ICD-10-CM | POA: Diagnosis not present

## 2017-11-11 DIAGNOSIS — F17210 Nicotine dependence, cigarettes, uncomplicated: Secondary | ICD-10-CM | POA: Diagnosis not present

## 2017-11-11 DIAGNOSIS — Z79899 Other long term (current) drug therapy: Secondary | ICD-10-CM | POA: Insufficient documentation

## 2017-11-11 DIAGNOSIS — Y9389 Activity, other specified: Secondary | ICD-10-CM | POA: Insufficient documentation

## 2017-11-11 DIAGNOSIS — Z85828 Personal history of other malignant neoplasm of skin: Secondary | ICD-10-CM | POA: Insufficient documentation

## 2017-11-11 DIAGNOSIS — S0101XA Laceration without foreign body of scalp, initial encounter: Secondary | ICD-10-CM

## 2017-11-11 DIAGNOSIS — W0110XA Fall on same level from slipping, tripping and stumbling with subsequent striking against unspecified object, initial encounter: Secondary | ICD-10-CM | POA: Diagnosis not present

## 2017-11-11 DIAGNOSIS — I10 Essential (primary) hypertension: Secondary | ICD-10-CM | POA: Insufficient documentation

## 2017-11-11 LAB — CBC WITH DIFFERENTIAL/PLATELET
BASOS PCT: 0 %
Basophils Absolute: 0 10*3/uL (ref 0.0–0.1)
Eosinophils Absolute: 0.2 10*3/uL (ref 0.0–0.7)
Eosinophils Relative: 3 %
HCT: 36.2 % (ref 36.0–46.0)
Hemoglobin: 12.5 g/dL (ref 12.0–15.0)
LYMPHS ABS: 2.1 10*3/uL (ref 0.7–4.0)
Lymphocytes Relative: 28 %
MCH: 34.4 pg — AB (ref 26.0–34.0)
MCHC: 34.5 g/dL (ref 30.0–36.0)
MCV: 99.7 fL (ref 78.0–100.0)
MONO ABS: 1.2 10*3/uL — AB (ref 0.1–1.0)
MONOS PCT: 16 %
Neutro Abs: 3.9 10*3/uL (ref 1.7–7.7)
Neutrophils Relative %: 53 %
Platelets: 282 10*3/uL (ref 150–400)
RBC: 3.63 MIL/uL — ABNORMAL LOW (ref 3.87–5.11)
RDW: 12.6 % (ref 11.5–15.5)
WBC: 7.4 10*3/uL (ref 4.0–10.5)

## 2017-11-11 MED ORDER — LIDOCAINE HCL (PF) 1 % IJ SOLN
15.0000 mL | Freq: Once | INTRAMUSCULAR | Status: AC
  Administered 2017-11-11: 15 mL via INTRADERMAL
  Filled 2017-11-11: qty 15

## 2017-11-11 MED ORDER — TETANUS-DIPHTH-ACELL PERTUSSIS 5-2.5-18.5 LF-MCG/0.5 IM SUSP
0.5000 mL | Freq: Once | INTRAMUSCULAR | Status: AC
  Administered 2017-11-11: 0.5 mL via INTRAMUSCULAR
  Filled 2017-11-11: qty 0.5

## 2017-11-11 NOTE — ED Triage Notes (Signed)
Pt admits to etoh use tonight. She has family and her service dog with her

## 2017-11-11 NOTE — ED Notes (Signed)
ED Provider at bedside. 

## 2017-11-11 NOTE — ED Provider Notes (Signed)
[facility] EMERGENCY DEPARTMENT Provider Note  CSN: [id number] Arrival date & time: 11/11/17 2213  Chief Complaint(s) Head Injury  HPI Mindy Young is a 72 y.o. female with a history of daily alcohol use presents to the emergency department after mechanical fall resulting in head trauma.  Patient reports drinking 2-3 drinks today.  She went home and was checking on her [place] fish when she slipped and fell into the pond, hitting her occiput.  She denied any loss of consciousness.  Stated that she felt scalp pain immediately.  Denies any neck pain or back pain, extremity pain, she sustained laceration to her occiput and skin tears in her left lower extremity.  HPI  Past Medical History Past Medical History:  Diagnosis Date  . Allergy   . Anemia 06/29/2011  . Anxiety   . Arthritis   . C1 cervical fracture ([place]) 02/1997  . Cancer ([place])    melanoma  . Depression   . GERD (gastroesophageal reflux disease)   . Hypertension    Patient Active Problem List   Diagnosis Date Noted  . Malignant neoplasm of lower-inner quadrant of right breast of female, estrogen receptor positive ([place]) 10/24/2017  . Anemia 06/29/2011  . Hypertension   . Hyponatremia 06/25/2011  . Transaminitis 06/25/2011  . Alcohol abuse 06/25/2011  . Cough 06/25/2011  . Cigarette smoker 06/25/2011   Home Medication(s) Prior to Admission medications   Medication Sig Start Date End Date Taking? Authorizing Provider  ALPRAZolam Duanne Moron) 0.5 MG tablet Take 0.5 mg by mouth 2 (two) times daily as needed. For anxiety 08/10/10   [provider]  amLODipine (NORVASC) 5 MG tablet Take 5 mg by mouth daily.    [provider]  BYSTOLIC 20 MG TABS Take 2 tablets by mouth daily. 11/02/16   [provider]  cetirizine (ZYRTEC) 10 MG tablet Take 10 mg by mouth as needed for allergies.    [provider]  cloNIDine (CATAPRES) 0.1 MG tablet Take 1 tablet by mouth 2 (two) times daily. 11/07/16    [provider]  methylphenidate (RITALIN) 10 MG tablet Take 1 tablet by mouth daily. 11/15/16   [provider]  ondansetron (ZOFRAN) 8 MG tablet Take 1 tablet by mouth daily. 12/05/16   [provider]  oxybutynin (DITROPAN-XL) 10 MG 24 hr tablet Take 1 tablet by mouth daily. 12/05/16   [provider]  traMADol (ULTRAM) 50 MG tablet Take 2 tablets (100 mg total) by mouth every 6 (six) hours as needed. 11/02/17   Rolm Bookbinder, MD  TRINTELLIX 20 MG TABS Take 1 tablet by mouth daily. 12/07/16   [provider]                                                                                                                                    Past Surgical History Past Surgical History:  Procedure Laterality Date  . ABDOMINAL HYSTERECTOMY    .  AUGMENTATION MAMMAPLASTY    . BREAST LUMPECTOMY WITH RADIOACTIVE SEED AND SENTINEL LYMPH NODE BIOPSY Right 11/02/2017   Procedure: BREAST LUMPECTOMY WITH RADIOACTIVE SEED AND SENTINEL LYMPH NODE BIOPSY;  Surgeon: Rolm Bookbinder, MD;  Location: [place];  Service: General;  Laterality: Right;  . CHOLECYSTECTOMY    . collarbone    . INCONTINENCE SURGERY    . KNEE SURGERY     removal of cyst, repair of cartiledge  . LAPAROSCOPY     for endometriosis  . ROTATOR CUFF REPAIR     left   Family History Family History  Problem Relation Age of Onset  . COPD Mother   . Prostate cancer Father   . Colon polyps Father   . Breast cancer Sister   . Colon cancer Neg Hx     Social History Social History   Tobacco Use  . Smoking status: Current Every Day Smoker    Packs/day: 2.00    Types: Cigarettes  . Smokeless tobacco: Never Used  Substance Use Topics  . Alcohol use: Yes    Alcohol/week: 21.0 standard drinks    Types: 21 Standard drinks or equivalent per week    Comment: 1-2 drinks daily (mixed vodka drinks); smokes e-cigs  . Drug use: No   Allergies Percocet  [oxycodone-acetaminophen]  Review of Systems Review of Systems All other systems are reviewed and are negative for acute change except as noted in the HPI  Physical Exam Vital Signs  I have reviewed the triage vital signs BP (!) 117/93 (BP Location: Left Arm)   Pulse 80   Temp 98.2 F (36.8 C) (Oral)   Resp 16   Ht 5\' 3"  (1.6 m)   Wt 69.6 kg   SpO2 96%   BMI 27.18 kg/m   Physical Exam  Constitutional: She is oriented to person, place, and time. She appears well-developed and well-nourished. No distress.  HENT:  Head: Normocephalic. Head is with laceration.    Right Ear: External ear normal.  Left Ear: External ear normal.  Nose: Nose normal.  Eyes: Pupils are equal, round, and reactive to light. Conjunctivae and EOM are normal. Right eye exhibits no discharge. Left eye exhibits no discharge. No scleral icterus.  Neck: Normal range of motion. Neck supple. No spinous process tenderness and no muscular tenderness present. Normal range of motion present.  Cardiovascular: Normal rate, regular rhythm and normal heart sounds. Exam reveals no gallop and no friction rub.  No murmur heard. Pulses:      Radial pulses are 2+ on the right side, and 2+ on the left side.       Dorsalis pedis pulses are 2+ on the right side, and 2+ on the left side.  Pulmonary/Chest: Effort normal and breath sounds normal. No stridor. No respiratory distress. She has no wheezes.  Abdominal: Soft. She exhibits no distension. There is no tenderness.  Musculoskeletal: She exhibits no edema or tenderness.       Cervical back: She exhibits no bony tenderness.       Thoracic back: She exhibits no bony tenderness.       Lumbar back: She exhibits no bony tenderness.       Left lower leg: She exhibits no tenderness, no bony tenderness and no swelling.       Legs: Clavicles stable. Chest stable to AP/Lat compression. Pelvis stable to Lat compression. No obvious extremity deformity. No chest or abdominal wall  contusion.  Neurological: She is alert and oriented to person, place,  and time.  Lip smacking. Moving all extremities  Skin: Skin is warm and dry. No rash noted. She is not diaphoretic. No erythema.  Psychiatric: She has a normal mood and affect.    ED Results and Treatments Labs (all labs ordered are listed, but only abnormal results are displayed) Labs Reviewed  CBC WITH DIFFERENTIAL/PLATELET - Abnormal; Notable for the following components:      Result Value   RBC 3.63 (*)    MCH 34.4 (*)    Monocytes Absolute 1.2 (*)    All other components within normal limits  BASIC METABOLIC PANEL - Abnormal; Notable for the following components:   Glucose, Bld 100 (*)    Calcium 8.5 (*)    All other components within normal limits                                                                                                                         EKG  EKG Interpretation  Date/Time:    Ventricular Rate:    PR Interval:    QRS Duration:   QT Interval:    QTC Calculation:   R Axis:     Text Interpretation:        Radiology Ct Head Wo Contrast  Result Date: 11/11/2017 CLINICAL DATA:  72 year old female with head trauma. EXAM: CT HEAD WITHOUT CONTRAST CT CERVICAL SPINE WITHOUT CONTRAST TECHNIQUE: Multidetector CT imaging of the head and cervical spine was performed following the standard protocol without intravenous contrast. Multiplanar CT image reconstructions of the cervical spine were also generated. COMPARISON:  Head CT dated 10/23/2014 FINDINGS: CT HEAD FINDINGS Brain: There is mild to moderate age-related atrophy and chronic microvascular ischemic changes. There is no acute intracranial hemorrhage. No mass effect or midline shift. No extra-axial fluid collection. Vascular: No hyperdense vessel or unexpected calcification. Skull: Normal. Negative for fracture or focal lesion. Sinuses/Orbits: No acute finding. Other: None CT CERVICAL SPINE FINDINGS Alignment: No acute subluxation.  Reversal of normal cervical lordosis at C4-C6, chronic. Grade 1 C4-C5 anterolisthesis. Skull base and vertebrae: No acute fracture. Soft tissues and spinal canal: No prevertebral fluid or swelling. No visible canal hematoma. Disc levels: Multilevel degenerative changes with endplate irregularity and disc space narrowing. There is fusion of C4-C5. Severe degenerative changes at C1-C2 lateral articulation as well as multilevel facet hypertrophy. Upper chest: Emphysema. A 1.3 x 1.7 cm pleural-based density in the posterior right apex most likely scarring. This was likely present on the CT of 2016. Nonemergent chest CT may provide better evaluation of the lungs if clinically indicated. Other: None IMPRESSION: 1. No acute intracranial hemorrhage. Age-related atrophy and chronic microvascular ischemic changes. 2. No acute/traumatic cervical spine pathology. Extensive degenerative changes. Electronically Signed   By: Anner Crete M.D.   On: 11/11/2017 23:53   Ct Cervical Spine Wo Contrast  Result Date: 11/11/2017 CLINICAL DATA:  72 year old female with head trauma. EXAM: CT HEAD WITHOUT CONTRAST CT CERVICAL SPINE WITHOUT CONTRAST TECHNIQUE: Multidetector CT imaging of the  head and cervical spine was performed following the standard protocol without intravenous contrast. Multiplanar CT image reconstructions of the cervical spine were also generated. COMPARISON:  Head CT dated 10/23/2014 FINDINGS: CT HEAD FINDINGS Brain: There is mild to moderate age-related atrophy and chronic microvascular ischemic changes. There is no acute intracranial hemorrhage. No mass effect or midline shift. No extra-axial fluid collection. Vascular: No hyperdense vessel or unexpected calcification. Skull: Normal. Negative for fracture or focal lesion. Sinuses/Orbits: No acute finding. Other: None CT CERVICAL SPINE FINDINGS Alignment: No acute subluxation. Reversal of normal cervical lordosis at C4-C6, chronic. Grade 1 C4-C5 anterolisthesis.  Skull base and vertebrae: No acute fracture. Soft tissues and spinal canal: No prevertebral fluid or swelling. No visible canal hematoma. Disc levels: Multilevel degenerative changes with endplate irregularity and disc space narrowing. There is fusion of C4-C5. Severe degenerative changes at C1-C2 lateral articulation as well as multilevel facet hypertrophy. Upper chest: Emphysema. A 1.3 x 1.7 cm pleural-based density in the posterior right apex most likely scarring. This was likely present on the CT of 2016. Nonemergent chest CT may provide better evaluation of the lungs if clinically indicated. Other: None IMPRESSION: 1. No acute intracranial hemorrhage. Age-related atrophy and chronic microvascular ischemic changes. 2. No acute/traumatic cervical spine pathology. Extensive degenerative changes. Electronically Signed   By: Anner Crete M.D.   On: 11/11/2017 23:53   Pertinent labs & imaging results that were available during my care of the patient were reviewed by me and considered in my medical decision making (see chart for details).  Medications Ordered in ED Medications  lidocaine (PF) (XYLOCAINE) 1 % injection 15 mL (has no administration in time range)  Tdap (BOOSTRIX) injection 0.5 mL (0.5 mLs Intramuscular Given 11/11/17 2340)                                                                                                                                    Procedures .Marland KitchenLaceration Repair Date/Time: 11/12/2017 12:53 AM Performed by: Fatima Blank, MD Authorized by: Fatima Blank, MD   Consent:    Consent obtained:  Verbal   Consent given by:  Patient   Risks discussed:  Pain, poor cosmetic result and poor wound healing   Alternatives discussed:  Delayed treatment Anesthesia (see MAR for exact dosages):    Anesthesia method:  Local infiltration   Local anesthetic:  Lidocaine 1% w/o epi Laceration details:    Location:  Scalp   Scalp location:  Occipital   Length  (cm):  4.5   Depth (mm):  5 Repair type:    Repair type:  Simple Pre-procedure details:    Preparation:  Patient was prepped and draped in usual sterile fashion and imaging obtained to evaluate for foreign bodies Exploration:    Hemostasis achieved with:  Direct pressure   Wound exploration: wound explored through full range of motion and entire depth of wound probed and visualized  Wound extent: no foreign bodies/material noted     Contaminated: no   Treatment:    Area cleansed with:  Betadine   Amount of cleaning:  Extensive   Irrigation solution:  Sterile saline   Irrigation volume:  1000cc   Irrigation method:  Pressure wash   Visualized foreign bodies/material removed: no   Skin repair:    Repair method:  Staples   Number of staples:  5 Approximation:    Approximation:  Close Post-procedure details:    Dressing:  Antibiotic ointment    (including critical care time)  Medical Decision Making / ED Course I have reviewed the nursing notes for this encounter and the patient's prior records (if available in EHR or on provided paperwork).    Mechanical fall in the setting of alcohol intoxication resulting in head trauma, scalp laceration and left lower extremity skin tears.  CT head and cervical spine negative.  No other evidence of serious injury noted on exam requiring imaging.  Given history of daily alcohol abuse, screening labs obtain and revealed no evidence of hyponatremia or other electrolyte derangements.  No renal insufficiency.  No anemia.  Skin tears cleansed and bandaged.  Scalp laceration thoroughly irrigated and closed as above.  Return for staple removal in 5 to 7 days.  Patient also provided resources for alcohol abuse.  Given resources for alcohol use disorder.    Also given information regarding tar dive dyskinesia.  On review of records there is no neuroleptic medications which patient takes.  Instructed to follow-up with PCP.  The patient appears  reasonably screened and/or stabilized for discharge and I doubt any other medical condition or other [facility] requiring further screening, evaluation, or treatment in the ED at this time prior to discharge.  The patient is safe for discharge with strict return precautions.   Final Clinical Impression(s) / ED Diagnoses Final diagnoses:  Injury of head, initial encounter  Laceration of scalp, initial encounter  Multiple skin tears   Disposition: Discharge with friend  Condition: Good  I have discussed the results, Dx and Tx plan with the patient who expressed understanding and agree(s) with the plan. Discharge instructions discussed at great length. The patient was given strict return precautions who verbalized understanding of the instructions. No further questions at time of discharge.    ED Discharge Orders    None       Follow Up: Redmon, Noelle, PA-C [address] [place] [address] [phone]  Schedule an appointment as soon as possible for a visit  As needed  [place] [id number] [phone]  in 5-7 days, For staple removal      This chart was dictated using voice recognition software.  Despite best efforts to proofread,  errors can occur which can change the documentation meaning.   Fatima Blank, MD 11/12/17 [phone]

## 2017-11-11 NOTE — ED Triage Notes (Signed)
Pt reports she tripped and fell into her [place]. Lac to scalp with bleeding controlled and skin tears noted to left leg

## 2017-11-12 LAB — BASIC METABOLIC PANEL
Anion gap: 11 (ref 5–15)
BUN: 14 mg/dL (ref 8–23)
CALCIUM: 8.5 mg/dL — AB (ref 8.9–10.3)
CO2: 25 mmol/L (ref 22–32)
CREATININE: 0.83 mg/dL (ref 0.44–1.00)
Chloride: 100 mmol/L (ref 98–111)
GFR calc non Af Amer: >60 mL/min (ref >60)
Glucose, Bld: 100 mg/dL — ABNORMAL HIGH (ref 70–99)
Potassium: 3.7 mmol/L (ref 3.5–5.1)
SODIUM: 136 mmol/L (ref 135–145)

## 2017-11-14 ENCOUNTER — Inpatient Hospital Stay (HOSPITAL_BASED_OUTPATIENT_CLINIC_OR_DEPARTMENT_OTHER): Payer: Managed Care, Other (non HMO) | Admitting: Hematology and Oncology

## 2017-11-14 DIAGNOSIS — Z79899 Other long term (current) drug therapy: Secondary | ICD-10-CM

## 2017-11-14 DIAGNOSIS — C50311 Malignant neoplasm of lower-inner quadrant of right female breast: Secondary | ICD-10-CM | POA: Diagnosis not present

## 2017-11-14 DIAGNOSIS — Z170 Estrogen receptor positive status [ER+]: Secondary | ICD-10-CM

## 2017-11-14 DIAGNOSIS — F17210 Nicotine dependence, cigarettes, uncomplicated: Secondary | ICD-10-CM | POA: Diagnosis not present

## 2017-11-14 NOTE — Progress Notes (Signed)
Patient Care Team: Cleda Mccreedy as PCP - General (Nurse Practitioner) Rolm Bookbinder, MD as Consulting Physician (General Surgery) Nicholas Lose, MD as Consulting Physician (Hematology and Oncology) Kyung Rudd, MD as Consulting Physician (Radiation Oncology)  DIAGNOSIS:  Encounter Diagnosis  Name Primary?  . Malignant neoplasm of lower-inner quadrant of right breast of female, estrogen receptor positive ([place])     SUMMARY OF ONCOLOGIC HISTORY:   Malignant neoplasm of lower-inner quadrant of right breast of female, estrogen receptor positive ([place])   10/19/2017 Initial Diagnosis    Screening detected right breast mass at 5:30 position measuring 0.4 cm, axilla negative, biopsy revealed grade 1-2 invasive ductal carcinoma ER 90%, PR 100%, Ki-67 15%, T1 a N0 stage Ia AJCC 8    11/02/2017 Surgery    Right lumpectomy: IDC grade 1, 0.6 cm, with Ig DCIS, margins negative, 0/4 lymph nodes negative,ER 100%, PR 100%, HER-2 negative, Ki-67 15%, T1b N0 stage Ia    11/14/2017 Cancer Staging    Staging form: Breast, AJCC 8th Edition - Pathologic: Stage IA (pT1b, pN0(sn), cM0, G1, ER+, PR+, HER2-) - Signed by Nicholas Lose, MD on 11/14/2017     CHIEF COMPLIANT: Follow-up after recent right lumpectomy  INTERVAL HISTORY: Mindy Young is a 72 year old with above-mentioned history of right breast cancer underwent lumpectomy and is here today to discuss the pathology report.  She is recovering and healing very well from the recent surgery.  She fell in her [place] and bruised her knee and had multiple staples on the scalp.  She is having more pain and discomfort from this rather than the breast cancer surgery.  REVIEW OF SYSTEMS:   Constitutional: Denies fevers, chills or abnormal weight loss Eyes: Denies blurriness of vision Ears, nose, mouth, throat, and face: Denies mucositis or sore throat Respiratory: Denies cough, dyspnea or wheezes Cardiovascular: Denies palpitation, chest  discomfort Gastrointestinal:  Denies nausea, heartburn or change in bowel habits Skin: Bandages related to recent fall Lymphatics: Denies new lymphadenopathy or easy bruising Neurological:Denies numbness, tingling or new weaknesses Behavioral/Psych: Mood is stable, no new changes  Extremities: No lower extremity edema  All other systems were reviewed with the patient and are negative.  I have reviewed the past medical history, past surgical history, social history and family history with the patient and they are unchanged from previous note.  ALLERGIES:  is allergic to percocet [oxycodone-acetaminophen].  MEDICATIONS:  Current Outpatient Medications  Medication Sig Dispense Refill  . ALPRAZolam (XANAX) 0.5 MG tablet Take 0.5 mg by mouth 2 (two) times daily as needed. For anxiety    . amLODipine (NORVASC) 5 MG tablet Take 5 mg by mouth daily.    Marland Kitchen BYSTOLIC 20 MG TABS Take 2 tablets by mouth daily.  0  . cetirizine (ZYRTEC) 10 MG tablet Take 10 mg by mouth as needed for allergies.    . cloNIDine (CATAPRES) 0.1 MG tablet Take 1 tablet by mouth 2 (two) times daily.  0  . methylphenidate (RITALIN) 10 MG tablet Take 1 tablet by mouth daily.  0  . ondansetron (ZOFRAN) 8 MG tablet Take 1 tablet by mouth daily.  0  . oxybutynin (DITROPAN-XL) 10 MG 24 hr tablet Take 1 tablet by mouth daily.  0  . traMADol (ULTRAM) 50 MG tablet Take 2 tablets (100 mg total) by mouth every 6 (six) hours as needed. 10 tablet 0  . TRINTELLIX 20 MG TABS Take 1 tablet by mouth daily.  0   No current facility-administered medications for  this visit.     PHYSICAL EXAMINATION: ECOG PERFORMANCE STATUS: 1 - Symptomatic but completely ambulatory  Vitals:   11/14/17 1534  BP: (!) 150/95  Pulse: 86  Resp: 18  Temp: (!) 97.5 F (36.4 C)  SpO2: 96%   Filed Weights   11/14/17 1534  Weight: 154 lb 3.2 oz (69.9 kg)    GENERAL:alert, no distress and comfortable SKIN: skin color, texture, turgor are normal, no  rashes or significant lesions EYES: normal, Conjunctiva are pink and non-injected, sclera clear OROPHARYNX:no exudate, no erythema and lips, buccal mucosa, and tongue normal  NECK: supple, thyroid normal size, non-tender, without nodularity LYMPH:  no palpable lymphadenopathy in the cervical, axillary or inguinal LUNGS: clear to auscultation and percussion with normal breathing effort HEART: regular rate & rhythm and no murmurs and no lower extremity edema ABDOMEN:abdomen soft, non-tender and normal bowel sounds MUSCULOSKELETAL:no cyanosis of digits and no clubbing  NEURO: alert & oriented x 3 with fluent speech, no focal motor/sensory deficits EXTREMITIES: No lower extremity edema   LABORATORY DATA:  I have reviewed the data as listed CMP Latest Ref Rng & Units 11/11/2017 10/25/2017 04/10/2015  Glucose 70 - 99 mg/dL 100(H) 91 90  BUN 8 - 23 mg/dL 14 18 11   Creatinine 0.44 - 1.00 mg/dL 0.83 0.84 0.73  Sodium 135 - 145 mmol/L 136 138 133(L)  Potassium 3.5 - 5.1 mmol/L 3.7 4.3 3.3(L)  Chloride 98 - 111 mmol/L 100 102 99(L)  CO2 22 - 32 mmol/L 25 27 23   Calcium 8.9 - 10.3 mg/dL 8.5(L) 8.9 7.9(L)  Total Protein 6.5 - 8.1 g/dL - 7.0 -  Total Bilirubin 0.3 - 1.2 mg/dL - 0.4 -  Alkaline Phos 38 - 126 U/L - 74 -  AST 15 - 41 U/L - 17 -  ALT 0 - 44 U/L - 10 -    Lab Results  Component Value Date   WBC 7.4 11/11/2017   HGB 12.5 11/11/2017   HCT 36.2 11/11/2017   MCV 99.7 11/11/2017   PLT 282 11/11/2017   NEUTROABS 3.9 11/11/2017    ASSESSMENT & PLAN:  Malignant neoplasm of lower-inner quadrant of right breast of female, estrogen receptor positive ([place]) 11/03/2017:Right lumpectomy: IDC grade 1, 0.6 cm, with Ig DCIS, margins negative, 0/4 lymph nodes negative,ER 100%, PR 100%, HER-2 negative, Ki-67 15%, T1b N0 stage Ia  Pathology counseling: I discussed the final pathology report of the patient provided  a copy of this report. I discussed the margins as well as lymph node surgeries. We  also discussed the final staging along with previously performed ER/PR and HER-2/neu testing.  Treatment plan: We will discussed the pros and cons of additional surgery.  She will be presented in the tumor board.  If it was up to the patient then she would prefer to undergo reresection of the margins.  She is not keen on radiation. 1. +/- Adjuvant radiation therapy followed by 2. followed by adjuvant antiestrogen therapy  If she undergoes additional surgery and does not need radiation, then we will send a prescription for antiestrogen therapy with letrozole 2.5 mg daily. Return to clinic for follow-up on antiestrogen therapy.  No orders of the defined types were placed in this encounter.  The patient has a good understanding of the overall plan. she agrees with it. she will call with any problems that may develop before the next visit here.   Harriette Ohara, MD 11/14/17

## 2017-11-14 NOTE — Assessment & Plan Note (Addendum)
11/03/2017:Right lumpectomy: IDC grade 1, 0.6 cm, with Ig DCIS, margins negative, 0/4 lymph nodes negative,ER 100%, PR 100%, HER-2 negative, Ki-67 15%, T1b N0 stage Ia  Pathology counseling: I discussed the final pathology report of the patient provided  a copy of this report. I discussed the margins as well as lymph node surgeries. We also discussed the final staging along with previously performed ER/PR and HER-2/neu testing.  Treatment plan: 1. Adjuvant radiation therapy followed by 2. Adjuvant antiestrogen therapy  Return to clinic towards the end of radiation

## 2017-11-15 ENCOUNTER — Other Ambulatory Visit: Payer: Self-pay | Admitting: General Surgery

## 2017-11-15 ENCOUNTER — Encounter (HOSPITAL_BASED_OUTPATIENT_CLINIC_OR_DEPARTMENT_OTHER): Payer: Self-pay | Admitting: *Deleted

## 2017-11-15 ENCOUNTER — Other Ambulatory Visit: Payer: Self-pay

## 2017-11-20 ENCOUNTER — Encounter (HOSPITAL_BASED_OUTPATIENT_CLINIC_OR_DEPARTMENT_OTHER)
Admission: RE | Disposition: A | Discharge status: Home / Self Care | Payer: Self-pay | Source: Ambulatory Visit | Attending: General Surgery

## 2017-11-20 ENCOUNTER — Ambulatory Visit (HOSPITAL_BASED_OUTPATIENT_CLINIC_OR_DEPARTMENT_OTHER): Payer: Managed Care, Other (non HMO) | Admitting: Certified Registered"

## 2017-11-20 ENCOUNTER — Encounter (HOSPITAL_BASED_OUTPATIENT_CLINIC_OR_DEPARTMENT_OTHER): Payer: Self-pay | Admitting: Certified Registered"

## 2017-11-20 ENCOUNTER — Ambulatory Visit (HOSPITAL_BASED_OUTPATIENT_CLINIC_OR_DEPARTMENT_OTHER)
Admission: RE | Admit: 2017-11-20 | Discharge: 2017-11-20 | Disposition: A | Discharge status: Home / Self Care | Payer: Managed Care, Other (non HMO) | Source: Ambulatory Visit | Attending: General Surgery | Admitting: General Surgery

## 2017-11-20 DIAGNOSIS — K219 Gastro-esophageal reflux disease without esophagitis: Secondary | ICD-10-CM | POA: Diagnosis not present

## 2017-11-20 DIAGNOSIS — Z8249 Family history of ischemic heart disease and other diseases of the circulatory system: Secondary | ICD-10-CM | POA: Insufficient documentation

## 2017-11-20 DIAGNOSIS — Z885 Allergy status to narcotic agent status: Secondary | ICD-10-CM | POA: Diagnosis not present

## 2017-11-20 DIAGNOSIS — Z79899 Other long term (current) drug therapy: Secondary | ICD-10-CM | POA: Diagnosis not present

## 2017-11-20 DIAGNOSIS — Z85820 Personal history of malignant melanoma of skin: Secondary | ICD-10-CM | POA: Diagnosis not present

## 2017-11-20 DIAGNOSIS — I10 Essential (primary) hypertension: Secondary | ICD-10-CM | POA: Diagnosis not present

## 2017-11-20 DIAGNOSIS — J449 Chronic obstructive pulmonary disease, unspecified: Secondary | ICD-10-CM | POA: Diagnosis not present

## 2017-11-20 DIAGNOSIS — N6031 Fibrosclerosis of right breast: Secondary | ICD-10-CM | POA: Diagnosis not present

## 2017-11-20 DIAGNOSIS — D0511 Intraductal carcinoma in situ of right breast: Secondary | ICD-10-CM | POA: Insufficient documentation

## 2017-11-20 DIAGNOSIS — M1990 Unspecified osteoarthritis, unspecified site: Secondary | ICD-10-CM | POA: Diagnosis not present

## 2017-11-20 DIAGNOSIS — F329 Major depressive disorder, single episode, unspecified: Secondary | ICD-10-CM | POA: Diagnosis not present

## 2017-11-20 DIAGNOSIS — F17200 Nicotine dependence, unspecified, uncomplicated: Secondary | ICD-10-CM | POA: Diagnosis not present

## 2017-11-20 DIAGNOSIS — F419 Anxiety disorder, unspecified: Secondary | ICD-10-CM | POA: Diagnosis not present

## 2017-11-20 DIAGNOSIS — N6489 Other specified disorders of breast: Secondary | ICD-10-CM | POA: Diagnosis not present

## 2017-11-20 DIAGNOSIS — Z803 Family history of malignant neoplasm of breast: Secondary | ICD-10-CM | POA: Insufficient documentation

## 2017-11-20 DIAGNOSIS — C50211 Malignant neoplasm of upper-inner quadrant of right female breast: Secondary | ICD-10-CM | POA: Diagnosis present

## 2017-11-20 DIAGNOSIS — N61 Mastitis without abscess: Secondary | ICD-10-CM | POA: Insufficient documentation

## 2017-11-20 HISTORY — PX: ASPIRATION OF ABSCESS: SHX6754

## 2017-11-20 HISTORY — PX: RE-EXCISION OF BREAST LUMPECTOMY: SHX6048

## 2017-11-20 HISTORY — DX: Malignant neoplasm of unspecified site of unspecified female breast: C50.919

## 2017-11-20 SURGERY — EXCISION, LESION, BREAST
Anesthesia: General | Site: Breast | Laterality: Right

## 2017-11-20 MED ORDER — FENTANYL CITRATE (PF) 100 MCG/2ML IJ SOLN
INTRAMUSCULAR | Status: AC
  Filled 2017-11-20: qty 2

## 2017-11-20 MED ORDER — LIDOCAINE HCL (CARDIAC) PF 100 MG/5ML IV SOSY
PREFILLED_SYRINGE | INTRAVENOUS | Status: DC | PRN
  Administered 2017-11-20: 60 mg via INTRAVENOUS

## 2017-11-20 MED ORDER — FENTANYL CITRATE (PF) 100 MCG/2ML IJ SOLN
25.0000 ug | INTRAMUSCULAR | Status: DC | PRN
  Administered 2017-11-20: 50 ug via INTRAVENOUS

## 2017-11-20 MED ORDER — PROMETHAZINE HCL 25 MG/ML IJ SOLN: 6.2500 mg | INTRAMUSCULAR | Status: DC | PRN

## 2017-11-20 MED ORDER — ONDANSETRON HCL 4 MG/2ML IJ SOLN: 4.0000 mg | Freq: Once | INTRAMUSCULAR | Status: DC | PRN

## 2017-11-20 MED ORDER — LIDOCAINE HCL (PF) 1 % IJ SOLN
INTRAMUSCULAR | Status: AC
  Filled 2017-11-20: qty 30

## 2017-11-20 MED ORDER — FENTANYL CITRATE (PF) 100 MCG/2ML IJ SOLN
50.0000 ug | INTRAMUSCULAR | Status: DC | PRN
  Administered 2017-11-20: 50 ug via INTRAVENOUS

## 2017-11-20 MED ORDER — GABAPENTIN 100 MG PO CAPS
ORAL_CAPSULE | ORAL | Status: AC
  Filled 2017-11-20: qty 1

## 2017-11-20 MED ORDER — CEFAZOLIN SODIUM-DEXTROSE 2-4 GM/100ML-% IV SOLN
2.0000 g | INTRAVENOUS | Status: AC
  Administered 2017-11-20: 2 g via INTRAVENOUS

## 2017-11-20 MED ORDER — SCOPOLAMINE 1 MG/3DAYS TD PT72: 1.0000 | MEDICATED_PATCH | Freq: Once | TRANSDERMAL | Status: DC | PRN

## 2017-11-20 MED ORDER — LACTATED RINGERS IV SOLN
INTRAVENOUS | Status: DC
  Administered 2017-11-20: 09:00:00 via INTRAVENOUS

## 2017-11-20 MED ORDER — MIDAZOLAM HCL 2 MG/2ML IJ SOLN: 1.0000 mg | INTRAMUSCULAR | Status: DC | PRN

## 2017-11-20 MED ORDER — ACETAMINOPHEN 500 MG PO TABS
ORAL_TABLET | ORAL | Status: AC
  Filled 2017-11-20: qty 2

## 2017-11-20 MED ORDER — HYDROCODONE-ACETAMINOPHEN 10-325 MG PO TABS: 1.0000 | ORAL_TABLET | Freq: Four times a day (QID) | ORAL | 0 refills | Status: DC | PRN

## 2017-11-20 MED ORDER — ENSURE PRE-SURGERY PO LIQD: 296.0000 mL | Freq: Once | ORAL | Status: DC

## 2017-11-20 MED ORDER — HYDROMORPHONE HCL 1 MG/ML IJ SOLN: 0.2500 mg | INTRAMUSCULAR | Status: DC | PRN

## 2017-11-20 MED ORDER — ACETAMINOPHEN 500 MG PO TABS
1000.0000 mg | ORAL_TABLET | ORAL | Status: AC
  Administered 2017-11-20: 1000 mg via ORAL

## 2017-11-20 MED ORDER — PROPOFOL 10 MG/ML IV BOLUS
INTRAVENOUS | Status: DC | PRN
  Administered 2017-11-20: 150 mg via INTRAVENOUS

## 2017-11-20 MED ORDER — LIDOCAINE HCL (PF) 1 % IJ SOLN
INTRAMUSCULAR | Status: DC | PRN
  Administered 2017-11-20: 5 mL

## 2017-11-20 MED ORDER — 0.9 % SODIUM CHLORIDE (POUR BTL) OPTIME
TOPICAL | Status: DC | PRN
  Administered 2017-11-20: 1000 mL

## 2017-11-20 MED ORDER — PROPOFOL 10 MG/ML IV BOLUS
INTRAVENOUS | Status: AC
  Filled 2017-11-20: qty 20

## 2017-11-20 MED ORDER — BUPIVACAINE HCL (PF) 0.25 % IJ SOLN
INTRAMUSCULAR | Status: AC
  Filled 2017-11-20: qty 30

## 2017-11-20 MED ORDER — ONDANSETRON HCL 4 MG/2ML IJ SOLN
INTRAMUSCULAR | Status: DC | PRN
  Administered 2017-11-20: 4 mg via INTRAVENOUS

## 2017-11-20 MED ORDER — GABAPENTIN 100 MG PO CAPS
100.0000 mg | ORAL_CAPSULE | ORAL | Status: AC
  Administered 2017-11-20: 100 mg via ORAL

## 2017-11-20 MED ORDER — BUPIVACAINE HCL (PF) 0.25 % IJ SOLN
INTRAMUSCULAR | Status: DC | PRN
  Administered 2017-11-20: 5 mL

## 2017-11-20 SURGICAL SUPPLY — 57 items
BINDER BREAST LRG (GAUZE/BANDAGES/DRESSINGS) IMPLANT
BINDER BREAST MEDIUM (GAUZE/BANDAGES/DRESSINGS) IMPLANT
BINDER BREAST XLRG (GAUZE/BANDAGES/DRESSINGS) ×3 IMPLANT
BINDER BREAST XXLRG (GAUZE/BANDAGES/DRESSINGS) IMPLANT
BLADE SURG 15 STRL LF DISP TIS (BLADE) ×2 IMPLANT
BLADE SURG 15 STRL SS (BLADE) ×1
CANISTER SUCT 1200ML W/VALVE (MISCELLANEOUS) ×3 IMPLANT
CHLORAPREP W/TINT 26ML (MISCELLANEOUS) ×3 IMPLANT
CLIP VESOCCLUDE SM WIDE 6/CT (CLIP) IMPLANT
CLSR STERI-STRIP ANTIMIC 1/2X4 (GAUZE/BANDAGES/DRESSINGS) ×3 IMPLANT
COVER BACK TABLE 60X90IN (DRAPES) ×3 IMPLANT
COVER MAYO STAND STRL (DRAPES) ×3 IMPLANT
DECANTER SPIKE VIAL GLASS SM (MISCELLANEOUS) IMPLANT
DERMABOND ADVANCED (GAUZE/BANDAGES/DRESSINGS) ×1
DERMABOND ADVANCED .7 DNX12 (GAUZE/BANDAGES/DRESSINGS) ×2 IMPLANT
DRAPE LAPAROSCOPIC ABDOMINAL (DRAPES) ×3 IMPLANT
DRAPE UTILITY XL STRL (DRAPES) ×3 IMPLANT
DRSG TEGADERM 4X4.75 (GAUZE/BANDAGES/DRESSINGS) ×3 IMPLANT
ELECT COATED BLADE 2.86 ST (ELECTRODE) ×3 IMPLANT
ELECT REM PT RETURN 9FT ADLT (ELECTROSURGICAL) ×3
ELECTRODE REM PT RTRN 9FT ADLT (ELECTROSURGICAL) ×2 IMPLANT
GAUZE SPONGE 4X4 12PLY STRL LF (GAUZE/BANDAGES/DRESSINGS) ×3 IMPLANT
GLOVE BIO SURGEON STRL SZ7 (GLOVE) ×3 IMPLANT
GLOVE BIOGEL PI IND STRL 7.5 (GLOVE) ×2 IMPLANT
GLOVE BIOGEL PI INDICATOR 7.5 (GLOVE) ×1
GOWN STRL REUS W/ TWL LRG LVL3 (GOWN DISPOSABLE) ×4 IMPLANT
GOWN STRL REUS W/TWL LRG LVL3 (GOWN DISPOSABLE) ×2
HEMOSTAT ARISTA ABSORB 3G PWDR (MISCELLANEOUS) IMPLANT
ILLUMINATOR WAVEGUIDE N/F (MISCELLANEOUS) ×3 IMPLANT
KIT MARKER MARGIN INK (KITS) IMPLANT
LIGHT WAVEGUIDE WIDE FLAT (MISCELLANEOUS) IMPLANT
NDL SAFETY ECLIPSE 18X1.5 (NEEDLE) ×2 IMPLANT
NEEDLE HYPO 18GX1.5 SHARP (NEEDLE) ×1
NEEDLE HYPO 25X1 1.5 SAFETY (NEEDLE) ×3 IMPLANT
NS IRRIG 1000ML POUR BTL (IV SOLUTION) ×3 IMPLANT
PACK BASIN DAY SURGERY FS (CUSTOM PROCEDURE TRAY) ×3 IMPLANT
PENCIL BUTTON HOLSTER BLD 10FT (ELECTRODE) ×3 IMPLANT
SLEEVE SCD COMPRESS KNEE MED (MISCELLANEOUS) ×3 IMPLANT
SPONGE LAP 4X18 RFD (DISPOSABLE) ×3 IMPLANT
STRIP CLOSURE SKIN 1/2X4 (GAUZE/BANDAGES/DRESSINGS) ×3 IMPLANT
SUT MNCRL AB 3-0 PS2 18 (SUTURE) IMPLANT
SUT MNCRL AB 4-0 PS2 18 (SUTURE) ×3 IMPLANT
SUT MON AB 5-0 PS2 18 (SUTURE) IMPLANT
SUT SILK 2 0 SH (SUTURE) IMPLANT
SUT VIC AB 2-0 SH 27 (SUTURE) ×1
SUT VIC AB 2-0 SH 27XBRD (SUTURE) ×2 IMPLANT
SUT VIC AB 3-0 SH 27 (SUTURE) ×2
SUT VIC AB 3-0 SH 27X BRD (SUTURE) ×4 IMPLANT
SUT VIC AB 5-0 PS2 18 (SUTURE) IMPLANT
SUT VICRYL AB 3 0 TIES (SUTURE) IMPLANT
SYR 20CC LL (SYRINGE) ×3 IMPLANT
SYR BULB IRRIGATION 50ML (SYRINGE) ×3 IMPLANT
SYR CONTROL 10ML LL (SYRINGE) ×3 IMPLANT
TOWEL GREEN STERILE FF (TOWEL DISPOSABLE) ×3 IMPLANT
TOWEL OR NON WOVEN STRL DISP B (DISPOSABLE) ×3 IMPLANT
TUBE CONNECTING 20X1/4 (TUBING) ×3 IMPLANT
YANKAUER SUCT BULB TIP NO VENT (SUCTIONS) ×3 IMPLANT

## 2017-11-20 NOTE — Op Note (Signed)
Preoperative diagnosis: Right breast cancer with close surgical margins, right axillary seroma Postoperative diagnosis: Same as above Procedure: 1.  Reexcision right breast lumpectomy 2.  Aspiration right axillary seroma Surgeon: Dr. Serita Grammes Anesthesia: General Estimated blood loss: Minimal Complications: None Drains: None Specimens: 1.  Right breast posterior margin marked short superior, long lateral, double deep 2.  Right breast superior margin marked short superior, long lateral, double deep Special count was correct at completion Disposition to recovery stable  Indications: This is a 72 year old female who I did a right seed guided lumpectomy and right axillary sentinel node biopsy on recently.  She has a couple of close margins.  We discussed re-excising these margins.  She also has developed a mildly symptomatic right axillary seroma that I told her we would aspirate while in surgery today.  Procedure: After informed consent was obtained the patient was taken to the operating room.  She was given antibiotics.  SCDs were in place.  She was placed under general anesthesia without complication.  Her breast was prepped and draped in the standard sterile surgical fashion.  A surgical timeout was then performed.  I accessed her right axillary seroma with an 18-gauge needle.  I then aspirated 100 cc of yellow serous fluid.  This was completely flat upon completion.  I then reentered her old incision after infiltration with local anesthetic.  I freed up the sutures that I closed down the cavity width.  I then excised additional superior margin that would encompass the anterior and medial areas that were positive on the last one.  This was marked with sutures as above.  I then remove the remainder of the posterior tissue which included her implant capsule.  I then passed this off the table after marking it as well.  I then closed the breast tissue and capsule over the implant with 2-0  Vicryl.  I then closed the incision with 3-0 Vicryl and 4-0 Monocryl.  Glue and Steri-Strips were applied.  She tolerated this well was extubated and transferred to recovery stable.

## 2017-11-20 NOTE — Anesthesia Postprocedure Evaluation (Signed)
Anesthesia Post Note  Patient: Mindy Young  Procedure(s) Performed: RE-EXCISION OF RIGHT BREAST MARGINS (Right Breast) ASPIRATION OF RIGHT AXILLARY SEROMA (Right )     Patient location during evaluation: PACU Anesthesia Type: General Level of consciousness: awake and alert Pain management: pain level controlled Vital Signs Assessment: post-procedure vital signs reviewed and stable Respiratory status: spontaneous breathing, nonlabored ventilation and respiratory function stable Cardiovascular status: blood pressure returned to baseline and stable Postop Assessment: no apparent nausea or vomiting Anesthetic complications: no    Last Vitals:  Vitals:   11/20/17 1030 11/20/17 1045  BP: (!) 145/90 129/85  Pulse:  70  Resp:  15  Temp: 36.4 C   SpO2:  99%    Last Pain:  Vitals:   11/20/17 1045  PainSc: 0-No pain                 Abie Killian A.

## 2017-11-20 NOTE — Transfer of Care (Signed)
Immediate Anesthesia Transfer of Care Note  Patient: Mindy Young  Procedure(s) Performed: RE-EXCISION OF RIGHT BREAST MARGINS (Right Breast) ASPIRATION OF RIGHT AXILLARY SEROMA (Right )  Patient Location: PACU  Anesthesia Type:General  Level of Consciousness: awake, alert , oriented and patient cooperative  Airway & Oxygen Therapy: Patient Spontanous Breathing and Patient connected to face mask oxygen  Post-op Assessment: Report given to RN and Post -op Vital signs reviewed and stable  Post vital signs: Reviewed and stable  Last Vitals:  Vitals Value Taken Time  BP    Temp    Pulse    Resp    SpO2      Last Pain: There were no vitals filed for this visit.       Complications: No apparent anesthesia complications

## 2017-11-20 NOTE — Interval H&P Note (Signed)
History and Physical Interval Note:  11/20/2017 9:32 AM Has close margins and axillary seroma, plan for drainage of seroma and margin excision today Mindy Young  has presented today for surgery, with the diagnosis of RIGHT BREAST CANCER  The various methods of treatment have been discussed with the patient and family. After consideration of risks, benefits and other options for treatment, the patient has consented to  Procedure(s): RE-EXCISION OF RIGHT BREAST MARGINS (Right) as a surgical intervention .  The patient's history has been reviewed, patient examined, no change in status, stable for surgery.  I have reviewed the patient's chart and labs.  Questions were answered to the patient's satisfaction.     Rolm Bookbinder

## 2017-11-20 NOTE — Anesthesia Procedure Notes (Signed)
Procedure Name: LMA Insertion Date/Time: 11/20/2017 9:44 AM Performed by: Signe Colt, CRNA Pre-anesthesia Checklist: Patient identified, Emergency Drugs available, Suction available and Patient being monitored Patient Re-evaluated:Patient Re-evaluated prior to induction Oxygen Delivery Method: Circle system utilized Preoxygenation: Pre-oxygenation with 100% oxygen Induction Type: IV induction Ventilation: Mask ventilation without difficulty LMA: LMA inserted LMA Size: 4.0 Number of attempts: 1 Airway Equipment and Method: Bite block Placement Confirmation: positive ETCO2 Tube secured with: Tape Dental Injury: Teeth and Oropharynx as per pre-operative assessment

## 2017-11-20 NOTE — Anesthesia Preprocedure Evaluation (Addendum)
Anesthesia Evaluation  Patient identified by MRN, date of birth, ID band Patient awake    Reviewed: Allergy & Precautions, NPO status , Patient's Chart, lab work & pertinent test results, reviewed documented beta blocker date and time   Airway Mallampati: II  TM Distance: >3 FB Neck ROM: Full    Dental  (+) Teeth Intact, Dental Advisory Given   Pulmonary Current Smoker,    Pulmonary exam normal breath sounds clear to auscultation       Cardiovascular hypertension, Pt. on home beta blockers and Pt. on medications Normal cardiovascular exam Rhythm:Regular Rate:Normal     Neuro/Psych PSYCHIATRIC DISORDERS Anxiety Depression negative neurological ROS     GI/Hepatic Neg liver ROS, GERD  ,  Endo/Other  negative endocrine ROS  Renal/GU negative Renal ROS     Musculoskeletal  (+) Arthritis ,   Abdominal   Peds  Hematology negative hematology ROS (+)   Anesthesia Other Findings Day of surgery medications reviewed with the patient.  Right breast cancer   Reproductive/Obstetrics                            Anesthesia Physical Anesthesia Plan  ASA: II  Anesthesia Plan: General   Post-op Pain Management:    Induction: Intravenous  PONV Risk Score and Plan: 2 and Dexamethasone and Ondansetron  Airway Management Planned: LMA  Additional Equipment:   Intra-op Plan:   Post-operative Plan: Extubation in OR  Informed Consent: I have reviewed the patients History and Physical, chart, labs and discussed the procedure including the risks, benefits and alternatives for the proposed anesthesia with the patient or authorized representative who has indicated his/her understanding and acceptance.   Dental advisory given  Plan Discussed with: CRNA  Anesthesia Plan Comments:         Anesthesia Quick Evaluation

## 2017-11-20 NOTE — Discharge Instructions (Signed)
[facility] °Office Phone Number [phone] ° °POST OP INSTRUCTIONS °Take 400 mg of ibuprofen every 8 hours or 650 mg tylenol every 6 hours for next 72 hours then as needed. Use ice several times daily also. °Always review your discharge instruction sheet given to you by the facility where your surgery was performed. ° °IF YOU HAVE DISABILITY OR FAMILY LEAVE FORMS, YOU MUST BRING THEM TO THE OFFICE FOR PROCESSING.  DO NOT GIVE THEM TO YOUR DOCTOR. ° °1. A prescription for pain medication may be given to you upon discharge.  Take your pain medication as prescribed, if needed.  If narcotic pain medicine is not needed, then you may take acetaminophen (Tylenol), naprosyn (Alleve) or ibuprofen (Advil) as needed. °2. Take your usually prescribed medications unless otherwise directed °3. If you need a refill on your pain medication, please contact your pharmacy.  They will contact our office to request authorization.  Prescriptions will not be filled after 5pm or on week-ends. °4. You should eat very light the first 24 hours after surgery, such as soup, crackers, pudding, etc.  Resume your normal diet the day after surgery. °5. Most patients will experience some swelling and bruising in the breast.  Ice packs and a good support bra will help.  Wear the breast binder provided or a sports bra for 72 hours day and night.  After that wear a sports bra during the day until you return to the office. Swelling and bruising can take several days to resolve.  °6. It is common to experience some constipation if taking pain medication after surgery.  Increasing fluid intake and taking a stool softener will usually help or prevent this problem from occurring.  A mild laxative (Milk of Magnesia or Miralax) should be taken according to package directions if there are no bowel movements after 48 hours. °7. Unless discharge instructions indicate otherwise, you may remove your bandages 48 hours after surgery and you may  shower at that time.  You may have steri-strips (small skin tapes) in place directly over the incision.  These strips should be left on the skin for 7-10 days and will come off on their own.  If your surgeon used skin glue on the incision, you may shower in 24 hours.  The glue will flake off over the next 2-3 weeks.  Any sutures or staples will be removed at the office during your follow-up visit. °8. ACTIVITIES:  You may resume regular daily activities (gradually increasing) beginning the next day.  Wearing a good support bra or sports bra minimizes pain and swelling.  You may have sexual intercourse when it is comfortable. °a. You may drive when you no longer are taking prescription pain medication, you can comfortably wear a seatbelt, and you can safely maneuver your car and apply brakes. °b. RETURN TO WORK:  ______________________________________________________________________________________ °9. You should see your doctor in the office for a follow-up appointment approximately two weeks after your surgery.  Your doctor’s nurse will typically make your follow-up appointment when she calls you with your pathology report.  Expect your pathology report 3-4 business days after your surgery.  You may call to check if you do not hear from us after three days. °10. OTHER INSTRUCTIONS: _______________________________________________________________________________________________ _____________________________________________________________________________________________________________________________________ °_____________________________________________________________________________________________________________________________________ °_____________________________________________________________________________________________________________________________________ ° °WHEN TO CALL DR WAKEFIELD: °1. Fever over 101.0 °2. Nausea and/or vomiting. °3. Extreme swelling or bruising. °4. Continued bleeding from  incision. °5. Increased pain, redness, or drainage from the incision. ° °The clinic staff is available to   answer your questions during regular business hours.  Please don’t hesitate to call and ask to speak to one of the nurses for clinical concerns.  If you have a medical emergency, go to the nearest emergency room or call 911.  A surgeon from [facility] is always on call at the hospital. ° °For further questions, please visit [email or web address] ° ° ° ° °Post Anesthesia Home Care Instructions ° °Activity: °Get plenty of rest for the remainder of the day. A responsible individual must stay with you for 24 hours following the procedure.  °For the next 24 hours, DO NOT: °-Drive a car °-Operate machinery °-Drink alcoholic beverages °-Take any medication unless instructed by your physician °-Make any legal decisions or sign important papers. ° °Meals: °Start with liquid foods such as gelatin or soup. Progress to regular foods as tolerated. Avoid greasy, spicy, heavy foods. If nausea and/or vomiting occur, drink only clear liquids until the nausea and/or vomiting subsides. Call your physician if vomiting continues. ° °Special Instructions/Symptoms: °Your throat may feel dry or sore from the anesthesia or the breathing tube placed in your throat during surgery. If this causes discomfort, gargle with warm salt water. The discomfort should disappear within 24 hours. ° °If you had a scopolamine patch placed behind your ear for the management of post- operative nausea and/or vomiting: ° °1. The medication in the patch is effective for 72 hours, after which it should be removed.  Wrap patch in a tissue and discard in the trash. Wash hands thoroughly with soap and water. °2. You may remove the patch earlier than 72 hours if you experience unpleasant side effects which may include dry mouth, dizziness or visual disturbances. °3. Avoid touching the patch. Wash your hands with soap and water after contact  with the patch. °   ° °

## 2017-11-21 ENCOUNTER — Inpatient Hospital Stay: Payer: Managed Care, Other (non HMO) | Admitting: Genetics

## 2017-11-21 ENCOUNTER — Encounter (HOSPITAL_BASED_OUTPATIENT_CLINIC_OR_DEPARTMENT_OTHER): Payer: Self-pay | Admitting: General Surgery

## 2017-11-21 ENCOUNTER — Inpatient Hospital Stay: Payer: Managed Care, Other (non HMO)

## 2017-11-28 ENCOUNTER — Ambulatory Visit: Payer: Medicare Other

## 2017-11-28 ENCOUNTER — Ambulatory Visit: Payer: Medicare Other | Admitting: Radiation Oncology

## 2017-11-29 NOTE — Progress Notes (Signed)
Location of Breast Cancer:Lower-inner quadrant of right breast of female  Histology per Pathology Report:  FINAL DIAGNOSIS Diagnosis 10-19-17 Breast, right, needle core biopsy, 5:30 o'clock, 6 cm fn - INVASIVE DUCTAL CARCINOMA. - DUCTAL CARCINOMA IN SITU. - SEE COMMENT. 1 of 2 FINAL for LOTTA, [place] ([id number]) Microscopic Comment The carcinoma appears grade I-II. A breast prognostic profile will be performed and the results reported separately. The results were called to [facility] on 10/20/17. (JBK:gt, 10/20/17) Enid Cutter MD Pathologist, Electronic Signature (Case signed 10/20/2017)  Receptor Status: ER(100% +), PR (100 % +), Her2-neu (-), Ki-(15 %)  Did patient present with symptoms (if so, please note symptoms) or was this found on screening mammography?: Screening detected right breast mass at 5:30 position   Past/Anticipated interventions by surgeon, if any:  FINAL DIAGNOSIS Diagnosis  11-20-17 Mauricio Po 1. Breast, excision, right superior margin - INFLAMMATION AND FIBROSIS CONSISTENT WITH PREVIOUS LUMPECTOMY. - NO MALIGNANCY IDENTIFIED. - FINAL SUPERIOR MARGIN CLEAR 2. Breast, excision, posterior margin - DUCTAL CARCINOMA IN SITU, 2 MM. - DUCTAL CARCINOMA IN SITU 4 MM FROM POSTERIOR MARGIN - INFLAMMATION AND FIBROSIS CONSISTENT WITH PREVIOUS LUMPECTOMY. - FINAL POSTERIOR MARGIN CLEAR. Claudette Laws MD Pathologist, Electronic Signature (Case signed 11/21/2017) Specimen Gross and Clinical Information FINAL DIAGNOSIS Diagnosis 09 -12-19 Dr. Rolm Bookbinder 1. Breast, lumpectomy, Right - INVASIVE DUCTAL CARCINOMA, GRADE 1, SPANNING 0.6 CM. - INTERMEDIATE GRADE DUCTAL CARCINOMA IN SITU. - INVASIVE CARCINOMA IS BROADLY PRESENT AT POSTERIOR MARGIN OF SPECIMEN #1. - IN SITU CARCINOMA IS <0.1 CM OF POSTERIOR MARGIN AND 0.1-0.2 CM OF SUPERIOR MARGIN OF SPECIMEN #1. - BIOPSY SITE. - SEE ONCOLOGY TABLE. 2. Breast, excision, Right additional  superior margin - INTERMEDIATE GRADE DUCTAL CARCINOMA IN SITU. - NEW SUPERIOR MARGIN IS NEGATIVE. - IN SITU CARCINOMA IS <0.1 CM OF ANTERIOR AND MEDIAL MARGINS FOCALLY. 3. Breast, excision, Right additional posterior margin - INTERMEDIATE GRADE DUCTAL CARCINOMA IN SITU. - IN SITU CARCINOMA IS <0.1 CM OF THE NEW POSTERIOR MARGIN FOCALLY. 4. Breast, excision, Right additional medial margin - INTERMEDIATE GRADE DUCTAL CARCINOMA IN SITU. - NEW MEDIAL MARGIN IS NEGATIVE. 5. Lymph node, sentinel, biopsy, Right axillary - ONE OF ONE LYMPH NODES NEGATIVE FOR CARCINOMA (0/1). 6. Lymph node, sentinel, biopsy, Right axillary - ONE OF ONE LYMPH NODES NEGATIVE FOR CARCINOMA (0/1). 7. Lymph node, sentinel, biopsy, Right axillary - ONE OF ONE LYMPH NODES NEGATIVE FOR CARCINOMA (0/1). 8. Lymph node, sentinel, biopsy, Right axillary - ONE OF ONE LYMPH NODES NEGATIVE FOR CARCINOMA (0/1). Microscopic Comment 1. INVASIVE CARCINOMA OF THE BREAST: Resection  ([id number]) Vicente Males MD Pathologist, Electronic Signature (Case signed 11/06/2017) Specimen Gross and Clinical Information Receptor Status: ER(100% +), PR (100 % +), Her2-neu (-), Ki-(15 %)  Past/Anticipated interventions by medical oncology, if any:Dr. Nicholas Lose   Chemotherapy No  Adjuvant radiation therapy followed by  Followed by adjuvant antiestrogen therapy  Lymphedema issues, if any:   ROM to right arm good. Skin to right breast and axilla intact with old bruising no signs of infection.  Fluid drawn from the right breast six times. Follow up appointment to see Dr. Rolm Bookbinder 12-01-17 saw PA for withdrawal of fluid.  Pain issues, if any:7/10 to right breast taking Hydrocoone    SAFETY ISSUES:  Prior radiation? :No  Pacemaker/ICD? :No  Possible current pregnancy? :No  Is the patient on methotrexate? :No  Menarche 13 G2 P2 BC Yes LMP 42 Hysterectomy 06-2017  Menopause 42 HRT Yes stopped taking  in the past 3-4  weeks  Current Complaints / other details:Father prostate cancer,breast cancer      Wt Readings from Last 3 Encounters:  12/05/17 150 lb 3.2 oz (68.1 kg)  11/15/17 152 lb 1.9 oz (69 kg)  11/14/17 154 lb 3.2 oz (69.9 kg)   BP 110/75 (BP Location: Left Arm, Patient Position: Sitting)   Pulse 94   Temp (!) 97.5 F (36.4 C) (Oral)   Resp 18   Ht 5' 3"  (1.6 m)   Wt 150 lb 3.2 oz (68.1 kg)   SpO2 95%   BMI 26.61 kg/m  Georgena Spurling, RN 11/29/2017,10:24 AM

## 2017-11-30 ENCOUNTER — Encounter: Payer: Self-pay | Admitting: Physical Therapy

## 2017-11-30 ENCOUNTER — Ambulatory Visit: Payer: Managed Care, Other (non HMO) | Attending: General Surgery | Admitting: Physical Therapy

## 2017-11-30 ENCOUNTER — Other Ambulatory Visit: Payer: Self-pay

## 2017-11-30 DIAGNOSIS — Z483 Aftercare following surgery for neoplasm: Secondary | ICD-10-CM

## 2017-11-30 DIAGNOSIS — R293 Abnormal posture: Secondary | ICD-10-CM | POA: Insufficient documentation

## 2017-11-30 DIAGNOSIS — R600 Localized edema: Secondary | ICD-10-CM

## 2017-11-30 DIAGNOSIS — C50311 Malignant neoplasm of lower-inner quadrant of right female breast: Secondary | ICD-10-CM | POA: Diagnosis present

## 2017-11-30 DIAGNOSIS — M25611 Stiffness of right shoulder, not elsewhere classified: Secondary | ICD-10-CM | POA: Diagnosis present

## 2017-11-30 DIAGNOSIS — Z170 Estrogen receptor positive status [ER+]: Secondary | ICD-10-CM | POA: Insufficient documentation
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
## 2017-12-05 ENCOUNTER — Encounter: Payer: Self-pay | Admitting: Radiation Oncology

## 2017-12-05 ENCOUNTER — Ambulatory Visit
Admission: RE | Admit: 2017-12-05 | Discharge: 2017-12-05 | Disposition: A | Discharge status: Home / Self Care | Payer: Managed Care, Other (non HMO) | Source: Ambulatory Visit | Attending: Radiation Oncology | Admitting: Radiation Oncology

## 2017-12-05 ENCOUNTER — Other Ambulatory Visit: Payer: Self-pay

## 2017-12-05 VITALS — BP 110/75 | HR 94 | Temp 97.5°F | Resp 18 | Ht 63.0 in | Wt 150.2 lb

## 2017-12-05 DIAGNOSIS — C50311 Malignant neoplasm of lower-inner quadrant of right female breast: Secondary | ICD-10-CM

## 2017-12-05 DIAGNOSIS — Z79899 Other long term (current) drug therapy: Secondary | ICD-10-CM | POA: Diagnosis not present

## 2017-12-05 DIAGNOSIS — F17210 Nicotine dependence, cigarettes, uncomplicated: Secondary | ICD-10-CM | POA: Diagnosis not present

## 2017-12-05 DIAGNOSIS — Z170 Estrogen receptor positive status [ER+]: Secondary | ICD-10-CM | POA: Insufficient documentation

## 2017-12-05 DIAGNOSIS — Z885 Allergy status to narcotic agent status: Secondary | ICD-10-CM | POA: Insufficient documentation

## 2017-12-05 DIAGNOSIS — I10 Essential (primary) hypertension: Secondary | ICD-10-CM | POA: Diagnosis not present

## 2017-12-05 NOTE — Progress Notes (Signed)
Radiation Oncology         [phone] ________________________________  Name: Mindy Young        MRN: [id number]  Date of Service: 12/05/2017 DOB: 1945-07-18  ME:BRAXEN, Barth Kirks, PA-C  Nicholas Lose, MD     REFERRING PHYSICIAN: Nicholas Lose, MD   DIAGNOSIS: The encounter diagnosis was Malignant neoplasm of lower-inner quadrant of right breast of female, estrogen receptor positive ([place]).   HISTORY OF PRESENT ILLNESS: Mindy Young is a 72 y.o. female originally seen in the multidisciplinary breast clinic for a new diagnosis of right breast cancer. The patient was noted to have a screening detected right breast mass. She underwent diagnostic imaging which located a 4 x 4 x 4 mm tumor at 5:30. Her axilla was negative for adenopathy. She underwent a biopsy on 10/19/17 which revealed a grade 1 invasive ductal carcinoma, ER/PR positive, HER2 negative, and a Ki 67 of 15%. She was originally leaning toward consideration of mastectomies however she ultimately decided to undergo lumpectomy which was performed on 11/02/2017 of the right breast.  She had an invasive ductal carcinoma, grade 1 measuring 6 mm with intermediate grade DCIS.  She had a positive posterior margin involving invasive disease and a posterior and superior margin that was 1 to 2 mm with DCIS.  4 sampled lymph nodes in the axilla were negative.  She did develop a seroma postoperatively, and because of her margins, underwent reexcision on 11/20/2017.  Her superior and posterior margins were cleared for both invasive and in situ disease.  She does not appear to be a good candidate for any systemic therapy or testing to determine this.  She comes today to discuss options of adjuvant radiotherapy.  PREVIOUS RADIATION THERAPY: No   PAST MEDICAL HISTORY:  Past Medical History:  Diagnosis Date  . Allergy   . Anemia 06/29/2011  . Anxiety   . Arthritis   . Breast cancer ([place])    right breast  . C1 cervical fracture ([place]) 02/1997  . Cancer  ([place])    melanoma  . Depression   . GERD (gastroesophageal reflux disease)   . Hypertension        PAST SURGICAL HISTORY: Past Surgical History:  Procedure Laterality Date  . ABDOMINAL HYSTERECTOMY    . ASPIRATION OF ABSCESS Right 11/20/2017   Procedure: ASPIRATION OF RIGHT AXILLARY SEROMA;  Surgeon: Rolm Bookbinder, MD;  Location: [facility];  Service: General;  Laterality: Right;  . AUGMENTATION MAMMAPLASTY    . BREAST LUMPECTOMY WITH RADIOACTIVE SEED AND SENTINEL LYMPH NODE BIOPSY Right 11/02/2017   Procedure: BREAST LUMPECTOMY WITH RADIOACTIVE SEED AND SENTINEL LYMPH NODE BIOPSY;  Surgeon: Rolm Bookbinder, MD;  Location: [facility];  Service: General;  Laterality: Right;  . CHOLECYSTECTOMY    . collarbone    . INCONTINENCE SURGERY    . KNEE SURGERY     removal of cyst, repair of cartiledge  . LAPAROSCOPY     for endometriosis  . RE-EXCISION OF BREAST LUMPECTOMY Right 11/20/2017   Procedure: RE-EXCISION OF RIGHT BREAST MARGINS;  Surgeon: Rolm Bookbinder, MD;  Location: [facility];  Service: General;  Laterality: Right;  . ROTATOR CUFF REPAIR     left     FAMILY HISTORY:  Family History  Problem Relation Age of Onset  . COPD Mother   . Prostate cancer Father   . Colon polyps Father   . Breast cancer Sister   . Colon cancer Neg Hx  SOCIAL HISTORY:  reports that she has been smoking cigarettes. She has been smoking about 2.00 packs per day. She has never used smokeless tobacco. She reports that she drinks about 21.0 standard drinks of alcohol per week. She reports that she does not use drugs. The patient is married and lives in [place]. 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 She has a Neurosurgeon, [place] who notifies her of when her blood pressure elevates.   ALLERGIES: Percocet [oxycodone-acetaminophen]   MEDICATIONS:  Current Outpatient Medications  Medication Sig Dispense Refill  . ALPRAZolam (XANAX) 0.5 MG tablet  Take 0.5 mg by mouth 2 (two) times daily as needed. For anxiety    . amLODipine (NORVASC) 5 MG tablet Take 5 mg by mouth daily.    Marland Kitchen BYSTOLIC 20 MG TABS Take 2 tablets by mouth daily.  0  . cetirizine (ZYRTEC) 10 MG tablet Take 10 mg by mouth as needed for allergies.    . cloNIDine (CATAPRES) 0.1 MG tablet Take 1 tablet by mouth 2 (two) times daily.  0  . HYDROcodone-acetaminophen (NORCO) 10-325 MG tablet Take 1 tablet by mouth every 6 (six) hours as needed. 10 tablet 0  . methylphenidate (RITALIN) 10 MG tablet Take 1 tablet by mouth daily.  0  . ondansetron (ZOFRAN) 8 MG tablet Take 1 tablet by mouth daily.  0  . oxybutynin (DITROPAN-XL) 10 MG 24 hr tablet Take 1 tablet by mouth daily.  0  . traMADol (ULTRAM) 50 MG tablet Take 2 tablets (100 mg total) by mouth every 6 (six) hours as needed. 10 tablet 0  . TRINTELLIX 20 MG TABS Take 1 tablet by mouth daily.  0   No current facility-administered medications for this encounter.      REVIEW OF SYSTEMS: On review of systems, the patient reports that she is doing well overall considering her second surgery. She denies any chest pain, shortness of breath, cough, fevers, chills, night sweats, unintended weight changes. She denies any bowel or bladder disturbances, and denies abdominal pain, nausea or vomiting. She denies any new musculoskeletal or joint aches or pains. A complete review of systems is obtained and is otherwise negative.     PHYSICAL EXAM:  Wt Readings from Last 3 Encounters:  11/15/17 152 lb 1.9 oz (69 kg)  11/14/17 154 lb 3.2 oz (69.9 kg)  11/11/17 153 lb 7 oz (69.6 kg)   Temp Readings from Last 3 Encounters:  11/20/17 98 F (36.7 C)  11/14/17 (!) 97.5 F (36.4 C) (Oral)  11/12/17 98.3 F (36.8 C) (Oral)   BP Readings from Last 3 Encounters:  11/20/17 133/81  11/14/17 (!) 150/95  11/12/17 115/85   Pulse Readings from Last 3 Encounters:  11/20/17 70  11/14/17 86  11/12/17 81     In general this is a well  appearing caucasian female in no acute distress. She is alert and oriented x4 and appropriate throughout the examination. HEENT reveals that the patient is normocephalic, atraumatic. EOMs are intact.  Skin is intact without any evidence of gross lesions.  Cardiopulmonary assessment is negative for acute distress and she exhibits normal effort.  Her right breast is examined and reveals well-healed lumpectomy and axillary incision sites without erythema.    ECOG = 0  0 - Asymptomatic (Fully active, able to carry on all predisease activities without restriction)  1 - Symptomatic but completely ambulatory (Restricted in physically strenuous activity but ambulatory and able to carry out work of a light or sedentary nature. For example, light  housework, office work)  2 - Symptomatic, <50% in bed during the day (Ambulatory and capable of all self care but unable to carry out any work activities. Up and about more than 50% of waking hours)  3 - Symptomatic, >50% in bed, but not bedbound (Capable of only limited self-care, confined to bed or chair 50% or more of waking hours)  4 - Bedbound (Completely disabled. Cannot carry on any self-care. Totally confined to bed or chair)  5 - Death   Eustace Pen MM, Creech RH, Tormey DC, et al. [phone]). "Toxicity and response criteria of the [facility] Group". [place] Oncol. 5 (6): 649-55    LABORATORY DATA:  Lab Results  Component Value Date   WBC 7.4 11/11/2017   HGB 12.5 11/11/2017   HCT 36.2 11/11/2017   MCV 99.7 11/11/2017   PLT 282 11/11/2017   Lab Results  Component Value Date   NA 136 11/11/2017   K 3.7 11/11/2017   CL 100 11/11/2017   CO2 25 11/11/2017   Lab Results  Component Value Date   ALT 10 10/25/2017   AST 17 10/25/2017   ALKPHOS 74 10/25/2017   BILITOT 0.4 10/25/2017      RADIOGRAPHY: Ct Head Wo Contrast  Result Date: 11/11/2017 CLINICAL DATA:  72 year old female with head trauma. EXAM: CT HEAD WITHOUT  CONTRAST CT CERVICAL SPINE WITHOUT CONTRAST TECHNIQUE: Multidetector CT imaging of the head and cervical spine was performed following the standard protocol without intravenous contrast. Multiplanar CT image reconstructions of the cervical spine were also generated. COMPARISON:  Head CT dated 10/23/2014 FINDINGS: CT HEAD FINDINGS Brain: There is mild to moderate age-related atrophy and chronic microvascular ischemic changes. There is no acute intracranial hemorrhage. No mass effect or midline shift. No extra-axial fluid collection. Vascular: No hyperdense vessel or unexpected calcification. Skull: Normal. Negative for fracture or focal lesion. Sinuses/Orbits: No acute finding. Other: None CT CERVICAL SPINE FINDINGS Alignment: No acute subluxation. Reversal of normal cervical lordosis at C4-C6, chronic. Grade 1 C4-C5 anterolisthesis. Skull base and vertebrae: No acute fracture. Soft tissues and spinal canal: No prevertebral fluid or swelling. No visible canal hematoma. Disc levels: Multilevel degenerative changes with endplate irregularity and disc space narrowing. There is fusion of C4-C5. Severe degenerative changes at C1-C2 lateral articulation as well as multilevel facet hypertrophy. Upper chest: Emphysema. A 1.3 x 1.7 cm pleural-based density in the posterior right apex most likely scarring. This was likely present on the CT of 2016. Nonemergent chest CT may provide better evaluation of the lungs if clinically indicated. Other: None IMPRESSION: 1. No acute intracranial hemorrhage. Age-related atrophy and chronic microvascular ischemic changes. 2. No acute/traumatic cervical spine pathology. Extensive degenerative changes. Electronically Signed   By: Anner Crete M.D.   On: 11/11/2017 23:53   Ct Cervical Spine Wo Contrast  Result Date: 11/11/2017 CLINICAL DATA:  72 year old female with head trauma. EXAM: CT HEAD WITHOUT CONTRAST CT CERVICAL SPINE WITHOUT CONTRAST TECHNIQUE: Multidetector CT imaging of the  head and cervical spine was performed following the standard protocol without intravenous contrast. Multiplanar CT image reconstructions of the cervical spine were also generated. COMPARISON:  Head CT dated 10/23/2014 FINDINGS: CT HEAD FINDINGS Brain: There is mild to moderate age-related atrophy and chronic microvascular ischemic changes. There is no acute intracranial hemorrhage. No mass effect or midline shift. No extra-axial fluid collection. Vascular: No hyperdense vessel or unexpected calcification. Skull: Normal. Negative for fracture or focal lesion. Sinuses/Orbits: No acute finding. Other: None CT CERVICAL SPINE  FINDINGS Alignment: No acute subluxation. Reversal of normal cervical lordosis at C4-C6, chronic. Grade 1 C4-C5 anterolisthesis. Skull base and vertebrae: No acute fracture. Soft tissues and spinal canal: No prevertebral fluid or swelling. No visible canal hematoma. Disc levels: Multilevel degenerative changes with endplate irregularity and disc space narrowing. There is fusion of C4-C5. Severe degenerative changes at C1-C2 lateral articulation as well as multilevel facet hypertrophy. Upper chest: Emphysema. A 1.3 x 1.7 cm pleural-based density in the posterior right apex most likely scarring. This was likely present on the CT of 2016. Nonemergent chest CT may provide better evaluation of the lungs if clinically indicated. Other: None IMPRESSION: 1. No acute intracranial hemorrhage. Age-related atrophy and chronic microvascular ischemic changes. 2. No acute/traumatic cervical spine pathology. Extensive degenerative changes. Electronically Signed   By: Anner Crete M.D.   On: 11/11/2017 23:53       IMPRESSION/PLAN: 1. Stage IA, [id number], grade 1, ER/PR positive invasive ductal carcinoma of the right breast.  I reviewed the patient's final results from pathology, and discussed the rationale to proceed with adjuvant radiotherapy.  She would also benefit from antiestrogen therapy long-term and  will continue to follow with Dr. Lindi Adie regarding this.  We discussed the risks, benefits, short, and long term effects of radiotherapy.  Given her in situ breast implants, I recommended a course of a course of 6 1/2 weeks of radiotherapy given her in situ breast implants.  All of this was discussed with the patient.  She is considering her options and she is not sure whether she wants to proceed with a course of adjuvant radiation treatment.  We held off on scheduling a simulation therefore but she will let us know if she does wish to proceed.  We did discuss in some detail the pros and cons of treatment.  In a visit lasting 25 minutes, greater than 50% of the time was spent face to face discussing her case, and coordinating the patient's care.   ________________________________   Jodelle Gross, MD, PhD

## 2017-12-21 ENCOUNTER — Telehealth: Payer: Self-pay | Admitting: Radiation Oncology

## 2017-12-21 ENCOUNTER — Telehealth: Payer: Self-pay | Admitting: Hematology and Oncology

## 2017-12-21 NOTE — Telephone Encounter (Signed)
Scheduled appt per 10/29 sch message - pt is aware of appt date and time   

## 2017-12-21 NOTE — Telephone Encounter (Signed)
I spoke with the patient and she is interested in taking antiestrogen but not to proceed with radiotherapy. She is not able to give a specific rationale for this decision but states she knows something "inside of her" is telling her she doesn't need the radiation. I will follow up with Dr. Lindi Adie and I asked her to call back if something changes in her decision making.

## 2017-12-22 ENCOUNTER — Other Ambulatory Visit: Payer: Self-pay | Admitting: *Deleted

## 2017-12-22 MED ORDER — LETROZOLE 2.5 MG PO TABS: 2.5000 mg | ORAL_TABLET | Freq: Every day | ORAL | 6 refills | Status: DC

## 2017-12-22 NOTE — Telephone Encounter (Signed)
Called pt to discuss Letrozole prescription. Gave instructions. Verified pharmacy. Discussed [facility] appt for Jan. Denies further needs or questions at this time.

## 2017-12-26 ENCOUNTER — Ambulatory Visit: Payer: Medicare Other | Attending: General Surgery | Admitting: Physical Therapy

## 2017-12-26 DIAGNOSIS — Z483 Aftercare following surgery for neoplasm: Secondary | ICD-10-CM | POA: Diagnosis present

## 2017-12-26 DIAGNOSIS — R293 Abnormal posture: Secondary | ICD-10-CM | POA: Diagnosis present

## 2017-12-26 DIAGNOSIS — M25611 Stiffness of right shoulder, not elsewhere classified: Secondary | ICD-10-CM | POA: Diagnosis present

## 2017-12-26 DIAGNOSIS — R600 Localized edema: Secondary | ICD-10-CM | POA: Diagnosis present
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
## 2017-12-29 ENCOUNTER — Encounter: Payer: Self-pay | Admitting: Physical Therapy

## 2017-12-29 ENCOUNTER — Ambulatory Visit: Payer: Medicare Other | Admitting: Physical Therapy

## 2017-12-29 DIAGNOSIS — R600 Localized edema: Secondary | ICD-10-CM

## 2017-12-29 DIAGNOSIS — R293 Abnormal posture: Secondary | ICD-10-CM

## 2017-12-29 DIAGNOSIS — M25611 Stiffness of right shoulder, not elsewhere classified: Secondary | ICD-10-CM

## 2017-12-29 DIAGNOSIS — Z483 Aftercare following surgery for neoplasm: Secondary | ICD-10-CM
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
## 2018-01-03 ENCOUNTER — Encounter: Payer: Self-pay | Admitting: Physical Therapy

## 2018-01-03 ENCOUNTER — Ambulatory Visit: Payer: Medicare Other | Admitting: Physical Therapy

## 2018-01-03 DIAGNOSIS — M25611 Stiffness of right shoulder, not elsewhere classified: Secondary | ICD-10-CM

## 2018-01-03 DIAGNOSIS — R293 Abnormal posture: Secondary | ICD-10-CM | POA: Diagnosis not present

## 2018-01-03 DIAGNOSIS — R600 Localized edema: Secondary | ICD-10-CM

## 2018-01-03 DIAGNOSIS — Z483 Aftercare following surgery for neoplasm: Secondary | ICD-10-CM
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
## 2018-01-04 ENCOUNTER — Ambulatory Visit: Payer: Medicare Other | Admitting: Physical Therapy

## 2018-01-09 ENCOUNTER — Other Ambulatory Visit: Payer: Managed Care, Other (non HMO)

## 2018-01-09 ENCOUNTER — Other Ambulatory Visit: Payer: Self-pay | Admitting: Hematology and Oncology

## 2018-01-09 ENCOUNTER — Encounter: Payer: Self-pay | Admitting: Physical Therapy

## 2018-01-09 ENCOUNTER — Inpatient Hospital Stay: Payer: Medicare Other | Attending: Hematology and Oncology | Admitting: Genetics

## 2018-01-09 ENCOUNTER — Telehealth: Payer: Self-pay

## 2018-01-09 ENCOUNTER — Inpatient Hospital Stay: Payer: Medicare Other

## 2018-01-09 ENCOUNTER — Encounter: Payer: Self-pay | Admitting: Genetics

## 2018-01-09 ENCOUNTER — Encounter: Payer: Managed Care, Other (non HMO) | Admitting: Genetics

## 2018-01-09 ENCOUNTER — Ambulatory Visit: Payer: Medicare Other | Admitting: Physical Therapy

## 2018-01-09 DIAGNOSIS — Z1379 Encounter for other screening for genetic and chromosomal anomalies: Secondary | ICD-10-CM

## 2018-01-09 DIAGNOSIS — Z803 Family history of malignant neoplasm of breast: Secondary | ICD-10-CM

## 2018-01-09 DIAGNOSIS — Z170 Estrogen receptor positive status [ER+]: Secondary | ICD-10-CM

## 2018-01-09 DIAGNOSIS — Z8042 Family history of malignant neoplasm of prostate: Secondary | ICD-10-CM

## 2018-01-09 DIAGNOSIS — R293 Abnormal posture: Secondary | ICD-10-CM

## 2018-01-09 DIAGNOSIS — C50311 Malignant neoplasm of lower-inner quadrant of right female breast: Secondary | ICD-10-CM | POA: Diagnosis not present

## 2018-01-09 DIAGNOSIS — Z483 Aftercare following surgery for neoplasm: Secondary | ICD-10-CM

## 2018-01-09 DIAGNOSIS — R600 Localized edema: Secondary | ICD-10-CM

## 2018-01-09 NOTE — Progress Notes (Signed)
REFERRING PROVIDER: Nicholas Lose, MD [place], [place] [address]  PRIMARY PROVIDER:  Lennie Odor, PA-C  PRIMARY REASON FOR VISIT:  1. Malignant neoplasm of lower-inner quadrant of right breast of female, estrogen receptor positive ([place])   2. Family history of breast cancer   3. Family history of prostate cancer     HISTORY OF PRESENT ILLNESS:   [name], a 72 y.o. female, was seen for a [facility] cancer genetics consultation at the request of Dr. Lindi Adie due to a personal and family history of breast/prostate cancer.  Mindy Young presents to clinic today to discuss the possibility of a hereditary predisposition to cancer, genetic testing, and to further clarify her future cancer risks, as well as potential cancer risks for family members.   In 2019, at the age of 23, Mindy Young was diagnosed with Invasive ductal carcinoma of the right breast, ER/PR+. On 11/02/2017 she underwent right lumpectomy.  Mindy Young has a history of melanoma on her leg excised at about age 2.    CANCER HISTORY:    Malignant neoplasm of lower-inner quadrant of right breast of female, estrogen receptor positive ([name])   10/19/2017 Initial Diagnosis    Screening detected right breast mass at 5:30 position measuring 0.4 cm, axilla negative, biopsy revealed grade 1-2 invasive ductal carcinoma ER 90%, PR 100%, Ki-67 15%, T1 a N0 stage Ia AJCC 8    11/02/2017 Surgery    Right lumpectomy: IDC grade 1, 0.6 cm, with Ig DCIS, margins negative, 0/4 lymph nodes negative,ER 100%, PR 100%, HER-2 negative, Ki-67 15%, T1b N0 stage Ia    11/14/2017 Cancer Staging    Staging form: Breast, AJCC 8th Edition - Pathologic: Stage IA (pT1b, pN0(sn), cM0, G1, ER+, PR+, HER2-) - Signed by Nicholas Lose, MD on 11/14/2017     HORMONAL RISK FACTORS:  First live birth at age 10.  Ovaries intact: no.  Hysterectomy: yes.  Menopausal status: postmenopausal.  Colonoscopy: yes; 2012, 2 polyps. Mammogram within the  last year: yes.  Past Medical History:  Diagnosis Date  . Allergy   . Anemia 06/29/2011  . Anxiety   . Arthritis   . Breast cancer ([place])    right breast  . C1 cervical fracture ([place]) 02/1997  . Cancer ([place])    melanoma  . Depression   . Family history of breast cancer   . Family history of prostate cancer   . GERD (gastroesophageal reflux disease)   . Hypertension     Past Surgical History:  Procedure Laterality Date  . ABDOMINAL HYSTERECTOMY    . ASPIRATION OF ABSCESS Right 11/20/2017   Procedure: ASPIRATION OF RIGHT AXILLARY SEROMA;  Surgeon: Rolm Bookbinder, MD;  Location: [facility];  Service: General;  Laterality: Right;  . AUGMENTATION MAMMAPLASTY    . BREAST LUMPECTOMY WITH RADIOACTIVE SEED AND SENTINEL LYMPH NODE BIOPSY Right 11/02/2017   Procedure: BREAST LUMPECTOMY WITH RADIOACTIVE SEED AND SENTINEL LYMPH NODE BIOPSY;  Surgeon: Rolm Bookbinder, MD;  Location: [facility];  Service: General;  Laterality: Right;  . CHOLECYSTECTOMY    . collarbone    . INCONTINENCE SURGERY    . KNEE SURGERY     removal of cyst, repair of cartiledge  . LAPAROSCOPY     for endometriosis  . RE-EXCISION OF BREAST LUMPECTOMY Right 11/20/2017   Procedure: RE-EXCISION OF RIGHT BREAST MARGINS;  Surgeon: Rolm Bookbinder, MD;  Location: [facility];  Service: General;  Laterality: Right;  . ROTATOR CUFF  REPAIR     left    Social History   Socioeconomic History  . Marital status: Married    Spouse name: Not on file  . Number of children: 2  . Years of education: Not on file  . Highest education level: Not on file  Occupational History  . Not on file  Social Needs  . Financial resource strain: Not on file  . Food insecurity:    Worry: Not on file    Inability: Not on file  . Transportation needs:    Medical: No    Non-medical: No  Tobacco Use  . Smoking status: Current Every Day Smoker    Packs/day: 2.00    Types: Cigarettes  .  Smokeless tobacco: Never Used  Substance and Sexual Activity  . Alcohol use: Yes    Alcohol/week: 21.0 standard drinks    Types: 21 Standard drinks or equivalent per week    Comment: 1-2 drinks daily (mixed vodka drinks); smokes e-cigs  . Drug use: No  . Sexual activity: Not on file  Lifestyle  . Physical activity:    Days per week: Not on file    Minutes per session: Not on file  . Stress: Not on file  Relationships  . Social connections:    Talks on phone: Not on file    Gets together: Not on file    Attends religious service: Not on file    Active member of club or organization: Not on file    Attends meetings of clubs or organizations: Not on file    Relationship status: Not on file  Other Topics Concern  . Not on file  Social History Narrative  . Not on file     FAMILY HISTORY:  We obtained a detailed, 4-generation family history.  Significant diagnoses are listed below: Family History  Problem Relation Age of Onset  . COPD Mother   . Prostate cancer Father 60       seed implant for treatment  . Colon polyps Father        'a few'  . Breast cancer Sister 53  . Breast cancer Maternal Aunt        dx >50  . Breast cancer Other 35       bilateral  . Colon cancer Neg Hx     Mindy Young has a 12 year-old son and a 89 year-old daughter with no hx of cancer.  She has 1 grandson. Mindy Young has a sister who was dx with breast cancer at 65.  She had genetic testing a few years ago that is reportedly negative. This sister has a daughter who had leukemia at 27, and bilateral breast cancer in her 43's.  Her genetic testing (a few years ago) was also negative.  Mindy Young has 3 brothers with no hx of cancer.   Mindy Young father: prostate cancer dx in hs 70's, had seed implant for treatment.  He has had a few colon polyps, He is now 54.  Paternal Aunts/Uncles: 4 paternal uncles with no hx of cancer, 1 pat aunt with cancer (type unk) 5-6 other aunts with no hx of cancer.  Paternal  cousins: no known hx of cancer.  Paternal grandfather: no hx of cancer Paternal grandmother:no hx of cancer  Mindy Young's mother: died of COPD at 1 Maternal Aunts/Uncles: 1 maternal aunt dx with breast cancer, 3-4 other aunts with no hx of cancer. 3-4 uncles with no hx of cancer.  Maternal cousins: no hx  of cancer Maternal grandfather: died due to age related disease, no cancer Maternal grandmother:died due to age related disease, no cancer  Patient's maternal ancestors are of N. European descent, and paternal ancestors are of N. European descent. There is no reported Ashkenazi Jewish ancestry. There is no known consanguinity.  GENETIC COUNSELING ASSESSMENT: ADYLEE LEONARDO is a 72 y.o. female with a personal and family history which is somewhat suggestive of a Hereditary Cancer Predisposition Syndrome. We, therefore, discussed and recommended the following at today's visit.   DISCUSSION: We reviewed the characteristics, features and inheritance patterns of hereditary cancer syndromes. We also discussed genetic testing, including the appropriate family members to test, the process of testing, insurance coverage and turn-around-time for results. We discussed the implications of a negative, positive and/or variant of uncertain significant result. We recommended Ms. Reardon pursue genetic testing for the Common Hereditary Cancers gene panel + Leukemia panel + melanoma panel.   The Common Hereditary Cancer Panel offered by [facility] includes sequencing and/or deletion duplication testing of the following 53 genes: APC, ATM, AXIN2, BARD1, BMPR1A, BRCA1, BRCA2, BRIP1, BUB1B, CDH1, CDK4, CDKN2A, CHEK2, CTNNA1, DICER1, ENG, EPCAM, GALNT12, GREM1, HOXB13, KIT, MEN1, MLH1, MLH3, MSH2, MSH3, MSH6, MUTYH, NBN, NF1, NTHL1, PALB2, PDGFRA, PMS2, POLD1, POLE, PTEN, RAD50, RAD51C, RAD51D, RNF43, RPS20, SDHA, SDHB, SDHC, SDHD, SMAD4, SMARCA4, STK11, TP53, TSC1, TSC2, VHL  The Melanoma panel offered by [facility] includes  sequencing and/or deletion duplication testing of the following 12 genes: BAP1,  BRCA2, CDK4, CDKN2A (p14ARF), CDKN2A (p16INK4a), POT1, RB1, PTEN, TERT, and TP53.  The following gene was evaluated for sequence changes only: MITF (c.952G>A, p.GLU318Lys variant only).  [facility] Myelodysplastic Syndrome/Leukemia Panel: ATM,BLM, CEBPA, EPCAM, GATA2, HRAS, MLH1, MSH2, MSH6, NBN, Nf1, PMS2, RUNX1, TERC, TERT, TP53.   We discussed that only 5-10% of cancers are associated with a Hereditary cancer predisposition syndrome.  One of the most common hereditary cancer syndromes that increases breast cancer risk is called Hereditary Breast and Ovarian Cancer (HBOC) syndrome.  This syndrome is caused by mutations in the BRCA1 and BRCA2 genes.  This syndrome increases an individual's lifetime risk to develop breast, ovarian, pancreatic, and other types of cancer.  There are also many other cancer predisposition syndromes caused by mutations in several other genes.  We discussed that if she is found to have a mutation in one of these genes, it may impact future medical management recommendations such as increased cancer screenings and consideration of risk reducing surgeries.  A positive result could also have implications for the patient's family members.  A Negative result would mean we were unable to identify a hereditary component to her cancer, but does not rule out the possibility of a hereditary basis for her  cancer.  There could be mutations that are undetectable by current technology, or in genes not yet tested or identified to increase cancer risk.    We discussed the potential to find a Variant of Uncertain Significance or VUS.  These are variants that have not yet been identified as pathogenic or benign, and it is unknown if this variant is associated with increased cancer risk or if this is a normal finding.  Most VUS's are reclassified to benign or likely benign.   It should not be used to make medical  management decisions. With time, we suspect the lab will determine the significance of any VUS's identified if any.   Based on Ms. Salameh's personal and family history of cancer, she meets medical criteria for genetic testing. Despite  that she meets criteria, she may still have an out of pocket cost. The laboratory can provide her with an estimate of her OOP cost.  she was given the contact information for the laboratory if she has further questions. Marland Kitchen   PLAN: After considering the risks, benefits, and limitations, Ms. Nordmann  provided informed consent to pursue genetic testing and the blood sample was sent to [facility] for analysis of the Common Hereditary Cancers Panel + Melanoma panel + leukemia/Myelodysplastic syndrome panel. Results should be available within approximately 2-3 weeks' time, at which point they will be disclosed by telephone to Ms. Betzold, as will any additional recommendations warranted by these results. Ms. Kiester will receive a summary of her genetic counseling visit and a copy of her results once available. This information will also be available in Epic. We encouraged Ms. Doshier to remain in contact with cancer genetics annually so that we can continuously update the family history and inform her of any changes in cancer genetics and testing that may be of benefit for her family. Ms. Diekman questions were answered to her satisfaction today. Our contact information was provided should additional questions or concerns arise.  Lastly, we encouraged Ms. Hughlett to remain in contact with cancer genetics annually so that we can continuously update the family history and inform her of any changes in cancer genetics and testing that may be of benefit for this family.   Ms.  Dolby questions were answered to her satisfaction today. Our contact information was provided should additional questions or concerns arise. Thank you for the referral and allowing [place] to share in the care of your  patient.   Tana Felts, MS, [facility] Certified Genetic Counselor [email or web address] phone: [phone]  The patient was seen for a total of 40 minutes in face-to-face genetic counseling. This patient was discussed with Drs. Magrinat, Lindi Adie and/or Burr Medico who agrees with the above.

## 2018-01-09 NOTE — Telephone Encounter (Signed)
Patient came to scheduling dept.  to check and see if she had any current appointment. Sent sch. msg. to [place] [facility] concerning patient. Her last visit was 9/24 no los. No health concerns at this time
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
## 2018-01-10 ENCOUNTER — Telehealth: Payer: Self-pay | Admitting: Adult Health

## 2018-01-10 NOTE — Telephone Encounter (Signed)
Scheduled appt per 11/19 sch message - sent reminder letter in the mail with appt date and time.

## 2018-01-11 ENCOUNTER — Encounter: Payer: Managed Care, Other (non HMO) | Admitting: Physical Therapy

## 2018-01-22 ENCOUNTER — Telehealth: Payer: Self-pay | Admitting: Genetics

## 2018-01-24 ENCOUNTER — Ambulatory Visit: Payer: Self-pay | Admitting: Genetics

## 2018-01-24 ENCOUNTER — Encounter: Payer: Self-pay | Admitting: Genetics

## 2018-01-24 DIAGNOSIS — Z1379 Encounter for other screening for genetic and chromosomal anomalies: Secondary | ICD-10-CM

## 2018-01-24 DIAGNOSIS — Z803 Family history of malignant neoplasm of breast: Secondary | ICD-10-CM

## 2018-01-24 DIAGNOSIS — C50311 Malignant neoplasm of lower-inner quadrant of right female breast: Secondary | ICD-10-CM

## 2018-01-24 DIAGNOSIS — Z8042 Family history of malignant neoplasm of prostate: Secondary | ICD-10-CM

## 2018-01-24 DIAGNOSIS — Z170 Estrogen receptor positive status [ER+]: Secondary | ICD-10-CM

## 2018-01-24 NOTE — Progress Notes (Signed)
HPI:  Mindy Young was previously seen in the [place] clinic on 01/09/2018 due to a personal and family history of cancer and concerns regarding a hereditary predisposition to cancer. Please refer to our prior cancer genetics clinic note for more information regarding Mindy Young's medical, social and family histories, and our assessment and recommendations, at the time. Mindy Young recent genetic test results were disclosed to her, as well as recommendations warranted by these results. These results and recommendations are discussed in more detail below.  CANCER HISTORY:    Malignant neoplasm of lower-inner quadrant of right breast of female, estrogen receptor positive ([place])   10/19/2017 Initial Diagnosis    Screening detected right breast mass at 5:30 position measuring 0.4 cm, axilla negative, biopsy revealed grade 1-2 invasive ductal carcinoma ER 90%, PR 100%, Ki-67 15%, T1 a N0 stage Ia AJCC 8    11/02/2017 Surgery    Right lumpectomy: IDC grade 1, 0.6 cm, with Ig DCIS, margins negative, 0/4 lymph nodes negative,ER 100%, PR 100%, HER-2 negative, Ki-67 15%, T1b N0 stage Ia    11/14/2017 Cancer Staging    Staging form: Breast, AJCC 8th Edition - Pathologic: Stage IA (pT1b, pN0(sn), cM0, G1, ER+, PR+, HER2-) - Signed by Nicholas Lose, MD on 11/14/2017    01/15/2018 Genetic Testing    The Common Hereditary Cancer Panel + Melanoma Panel + Myelodysplastic syndrome/Leukemia Panel (64 genes): APC, ATM, AXIN2, BAP1, BARD1, BLM, BMPR1A, BRCA1, BRCA2, BRIP1, BUB1B, CDH1, CDK4, CDKN2A, CEBPA, CHEK2, CTNNA1, DICER1, ENG, EPCAM, GALNT12, GREM1, HOXB13, KIT, MEN1, MLH1, MLH3, MSH2, MSH3, MSH6, MUTYH, NBN, NF1, NTHL1, PALB2, PDGFRA, PMS2, POLD1, POLE, PTEN, RAD50, RAD51C, RAD51D, RB1, RNF43, RPS20, RUNX1, SDHA, SDHB, SDHC, SDHD, SMAD4, SMARCA4, STK11, TP53, TERC, TERT, TSC1, TSC2, VHL.   Results: No Pathogenic variants identified.  A variant of uncertain significance in the gene TERT was identified  c.[id number] (p.[id number]). The date of this test report is 01/15/2018.       FAMILY HISTORY:  We obtained a detailed, 4-generation family history.  Significant diagnoses are listed below: Family History  Problem Relation Age of Onset  . COPD Mother   . Prostate cancer Father 93       seed implant for treatment  . Colon polyps Father        'a few'  . Breast cancer Sister 50  . Breast cancer Maternal Aunt        dx >50  . Breast cancer Other 35       bilateral  . Colon cancer Neg Hx     Mindy Young has a 30 year-old son and a 91 year-old daughter with no hx of cancer.  She has 1 grandson. Mindy Young has a sister who was dx with breast cancer at 75.  She had genetic testing a few years ago that is reportedly negative. This sister has a daughter who had leukemia at 56, and bilateral breast cancer in her 41's.  Her genetic testing (a few years ago) was also negative.  Mindy Young has 3 brothers with no hx of cancer.   Mindy Young father: prostate cancer dx in hs 70's, had seed implant for treatment.  He has had a few colon polyps, He is now 3.  Paternal Aunts/Uncles: 4 paternal uncles with no hx of cancer, 1 pat aunt with cancer (type unk) 5-6 other aunts with no hx of cancer.  Paternal cousins: no known hx of cancer.  Paternal grandfather: no hx of cancer Paternal grandmother:no hx  of cancer  Mindy Young mother: died of COPD at 65 Maternal Aunts/Uncles: 1 maternal aunt dx with breast cancer, 3-4 other aunts with no hx of cancer. 3-4 uncles with no hx of cancer.  Maternal cousins: no hx of cancer Maternal grandfather: died due to age related disease, no cancer Maternal grandmother:died due to age related disease, no cancer  Patient's maternal ancestors are of N. European descent, and paternal ancestors are of N. European descent. There is no reported Ashkenazi Jewish ancestry. There is no known consanguinity.  GENETIC TEST RESULTS: Genetic testing performed through [facility] Common  Hereditary Cancers Panel + Melanoma Panel + Myelodysplastic syndrome/Leukemia Panel reported out on 01/15/2018 showed no pathogenic mutations.   The Common Hereditary Cancer Panel + Melanoma Panel + Myelodysplastic syndrome/Leukemia Panel (64 genes): APC, ATM, AXIN2, BAP1, BARD1, BLM, BMPR1A, BRCA1, BRCA2, BRIP1, BUB1B, CDH1, CDK4, CDKN2A, CEBPA, CHEK2, CTNNA1, DICER1, ENG, EPCAM, GALNT12, GREM1, HOXB13, KIT, MEN1, MLH1, MLH3, MSH2, MSH3, MSH6, MUTYH, NBN, NF1, NTHL1, PALB2, PDGFRA, PMS2, POLD1, POLE, PTEN, RAD50, RAD51C, RAD51D, RB1, RNF43, RPS20, RUNX1, SDHA, SDHB, SDHC, SDHD, SMAD4, SMARCA4, STK11, TP53, TERC, TERT, TSC1, TSC2, VHL. Marland Kitchen  A variant of uncertain significance (VUS) in a gene called TERT was also noted. c.1615G>A (p.Glu539Lys)  The test report will be scanned into EPIC and will be located under the Molecular Pathology section of the Results Review tab. A portion of the result report is included below for reference.     We discussed with Mindy Young that because current genetic testing is not perfect, it is possible there may be a gene mutation in one of these genes that current testing cannot detect, but that chance is small.  We also discussed, that there could be another gene that has not yet been discovered, or that we have not yet tested, that is responsible for the cancer diagnoses in the family. It is also possible there is a hereditary cause for the cancer in the family that Mindy Young did not inherit and therefore was not identified in her testing.  Therefore, it is important to remain in touch with cancer genetics in the future so that we can continue to offer Mindy Young the most up to date genetic testing.   Regarding the VUS in TERT: At this time, it is unknown if this variant is associated with increased cancer risk or if this is a normal finding, but most variants such as this get reclassified to being inconsequential. It should not be used to make medical management decisions.  With time, we suspect the lab will determine the significance of this variant, if any. If we do learn more about it, we will try to contact Mindy Young to discuss it further. However, it is important to stay in touch with [place] periodically and keep the address and phone number up to date.  ADDITIONAL GENETIC TESTING: We discussed with Mindy Young that her genetic testing was fairly extensive.  If there are are genes identified to increase cancer risk that can be analyzed in the future, we would be happy to discuss and coordinate this testing at that time.    CANCER SCREENING RECOMMENDATIONS: Mindy Young test result is considered negative (normal).  This means that we have not identified a hereditary cause for her personal and family history of cancer at this time.   While reassuring, this does not definitively rule out a hereditary predisposition to cancer. It is still possible that there could be genetic mutations that are undetectable by current technology,  or genetic mutations in genes that have not been tested or identified to increase cancer risk.  Therefore, it is recommended she continue to follow the cancer management and screening guidelines provided by her oncology and primary healthcare provider. An individual's cancer risk is not determined by genetic test results alone.  Overall cancer risk assessment includes additional factors such as personal medical history, family history, etc.  These should be used to make a personalized plan for cancer prevention and surveillance.    RECOMMENDATIONS FOR FAMILY MEMBERS:  Relatives in this family might be at some increased risk of developing cancer, over the general population risk, simply due to the family history of cancer.  We recommended women in this family have a yearly mammogram beginning at age 58, or 13 years younger than the earliest onset of cancer, an annual clinical breast exam, and perform monthly breast self-exams. Women in this family should also  have a gynecological exam as recommended by their primary provider. All family members should have a colonoscopy by age 20 (or as directed by their doctors).  All family members should inform their physicians about the family history of cancer so their doctors can make the most appropriate screening recommendations for them.   FOLLOW-UP: Lastly, we discussed with Mindy Young that cancer genetics is a rapidly advancing field and it is possible that new genetic tests will be appropriate for her and/or her family members in the future. We encouraged her to remain in contact with cancer genetics on an annual basis so we can update her personal and family histories and let her know of advances in cancer genetics that may benefit this family.   Our contact number was provided. Mindy Young questions were answered to her satisfaction, and she knows she is welcome to call us at anytime with additional questions or concerns.   Mindy Luz, MS, [facility] Certified Genetic Counselor [email or web address]

## 2018-02-09 NOTE — Telephone Encounter (Signed)
Called to check in with patient as she has not been here in a while and still has an open episode.  She has been very busy as her father has been ill.  She says she is still having problems with her hand and arm swelling and wants to come to back to PT when things are less hectic.  Will keep episode open.  Maudry Diego, PT @TODAY @ 10:05 AM

## 2018-02-26 ENCOUNTER — Telehealth: Payer: Self-pay | Admitting: Genetics

## 2018-02-26 NOTE — Telephone Encounter (Signed)
Revealed negative genetic testing.  Revealed that a VUS in TERT was identified.   This normal result is reassuring and indicates that it is unlikely Mindy Young's cancer is due to a hereditary cause.  It is unlikely that there is an increased risk of another cancer due to a mutation in one of these genes.  However, genetic testing is not perfect, and cannot definitively rule out a hereditary cause.  It will be important for her to keep in contact with genetics to learn if any additional testing may be needed in the future.

## 2018-02-27 ENCOUNTER — Encounter: Payer: Self-pay | Admitting: Physical Therapy

## 2018-02-27 ENCOUNTER — Ambulatory Visit: Payer: Medicare Other | Attending: General Surgery | Admitting: Physical Therapy

## 2018-02-27 DIAGNOSIS — M25611 Stiffness of right shoulder, not elsewhere classified: Secondary | ICD-10-CM

## 2018-02-27 DIAGNOSIS — R293 Abnormal posture: Secondary | ICD-10-CM

## 2018-02-27 DIAGNOSIS — Z483 Aftercare following surgery for neoplasm: Secondary | ICD-10-CM | POA: Insufficient documentation

## 2018-02-27 DIAGNOSIS — R600 Localized edema: Secondary | ICD-10-CM
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
## 2018-03-01 ENCOUNTER — Encounter: Payer: Self-pay | Admitting: Physical Therapy

## 2018-03-01 ENCOUNTER — Ambulatory Visit: Payer: Medicare Other | Admitting: Physical Therapy

## 2018-03-01 DIAGNOSIS — R600 Localized edema: Secondary | ICD-10-CM

## 2018-03-01 DIAGNOSIS — Z483 Aftercare following surgery for neoplasm: Secondary | ICD-10-CM

## 2018-03-01 DIAGNOSIS — M25611 Stiffness of right shoulder, not elsewhere classified: Secondary | ICD-10-CM

## 2018-03-01 DIAGNOSIS — R293 Abnormal posture: Secondary | ICD-10-CM | POA: Diagnosis not present

## 2018-03-01 NOTE — Patient Instructions (Signed)
First of all, check with your insurance company to see if provider is in network    [facility] (for wigs and compression sleeves / gloves/gauntlets )  [address] [place], [place] [address] [phone]  Will file some insurances --- call for appointment   [facility] (for mastectomy prosthetics and garments) [address] [place], [place] [address] [phone] Will file some insurances --- call for appointment  [facility]  [address]  , [place] [address] [phone] Lower extremity garments  [facility] [address] [place], [place]  [address] [phone]  Cathy Rubel ( Medicaid certified lymphedema fitter) [phone] [email or web address]  Melissa Meares  [facility]  [phone]  [facility] [address] [place],  [address] [phone]  Other Resources: National Lymphedema Network:  www.lymphnet.org www.Klosetraining.com for patient articles and self manual lymph drainage information www.lymphedemablog.com has informative articles.  www.compressionguru.com www.lymphedemaproducts.com www.brightlifedirect.com www.compressionguru.com 
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
## 2018-03-12 ENCOUNTER — Ambulatory Visit (INDEPENDENT_AMBULATORY_CARE_PROVIDER_SITE_OTHER): Payer: Self-pay | Admitting: Orthopaedic Surgery

## 2018-03-14 ENCOUNTER — Ambulatory Visit: Payer: Medicare Other | Admitting: Cardiovascular Disease

## 2018-03-14 ENCOUNTER — Encounter: Payer: Self-pay | Admitting: Cardiovascular Disease

## 2018-03-14 VITALS — BP 122/90 | HR 119 | Ht 63.0 in | Wt 161.8 lb

## 2018-03-14 DIAGNOSIS — R000 Tachycardia, unspecified: Secondary | ICD-10-CM | POA: Diagnosis not present

## 2018-03-14 NOTE — Progress Notes (Signed)
Cardiology Office Note:    Date:  03/14/2018   ID:  Mindy Young, DOB [birth year], MRN [id number]  PCP:  Lennie Odor, PA-C  Cardiologist:  Sherren Mocha, MD  Electrophysiologist:  None   Referring MD: Lennie Odor, PA-C   Chief Complaint  Patient presents with  . Palpitations    History of Present Illness:    Mindy Young is a 73 y.o. female referred by Dr Nori Riis for evaluation of rapid pulse rate.   She was evaluated by her PCP a few weeks ago because she's been feeling like her tongue is swelling and has some problems with her speech. She was incidentally noted to have a rapid heart rate and labs were checked for further assessment. She's been under a great deal of stress over the last several months. She had 2 breast surgeries in September 2019 and developed lymphedema following surgery.   She has symptoms of PND on occasion and gets up to walk around. She feels better after she is upright.   The patient is a longstanding smoker, currently 1 ppd. She has a family hx of CAD, with her brother having CABG last year - he is 2 years younger than the patient.   Past Medical History:  Diagnosis Date  . Allergy   . Anemia 06/29/2011  . Anxiety   . Arthritis   . Breast cancer ([facility])    right breast  . C1 cervical fracture ([facility]) 02/1997  . Cancer ([facility])    melanoma  . Depression   . Family history of breast cancer   . Family history of prostate cancer   . GERD (gastroesophageal reflux disease)   . Hypertension     Past Surgical History:  Procedure Laterality Date  . ABDOMINAL HYSTERECTOMY    . ASPIRATION OF ABSCESS Right 11/20/2017   Procedure: ASPIRATION OF RIGHT AXILLARY SEROMA;  Surgeon: Rolm Bookbinder, MD;  Location: [facility];  Service: General;  Laterality: Right;  . AUGMENTATION MAMMAPLASTY    . BREAST LUMPECTOMY WITH RADIOACTIVE SEED AND SENTINEL LYMPH NODE BIOPSY Right 11/02/2017   Procedure: BREAST LUMPECTOMY WITH RADIOACTIVE SEED AND SENTINEL  LYMPH NODE BIOPSY;  Surgeon: Rolm Bookbinder, MD;  Location: [facility];  Service: General;  Laterality: Right;  . CHOLECYSTECTOMY    . collarbone    . INCONTINENCE SURGERY    . KNEE SURGERY     removal of cyst, repair of cartiledge  . LAPAROSCOPY     for endometriosis  . RE-EXCISION OF BREAST LUMPECTOMY Right 11/20/2017   Procedure: RE-EXCISION OF RIGHT BREAST MARGINS;  Surgeon: Rolm Bookbinder, MD;  Location: [facility];  Service: General;  Laterality: Right;  . ROTATOR CUFF REPAIR     left    Current Medications: Current Meds  Medication Sig  . ALPRAZolam (XANAX) 0.5 MG tablet Take 0.5 mg by mouth 2 (two) times daily as needed. For anxiety  . amLODipine (NORVASC) 5 MG tablet Take 5 mg by mouth daily.  . cetirizine (ZYRTEC) 10 MG tablet Take 10 mg by mouth as needed for allergies.  . cloNIDine (CATAPRES) 0.1 MG tablet Take 1 tablet by mouth 2 (two) times daily.  Marland Kitchen letrozole (FEMARA) 2.5 MG tablet Take 1 tablet (2.5 mg total) by mouth daily.  . metoprolol succinate (TOPROL-XL) 100 MG 24 hr tablet Take 1 tablet by mouth daily.  [place] Kitchen omeprazole (PRILOSEC) 40 MG capsule Take 1 capsule by mouth 2 (two) times daily.  [place] Kitchen oxybutynin (DITROPAN-XL) 10 MG 24  hr tablet Take 1 tablet by mouth daily.  . TRINTELLIX 20 MG TABS Take 1 tablet by mouth daily.     Allergies:   Percocet [oxycodone-acetaminophen]   Social History   Socioeconomic History  . Marital status: Married    Spouse name: Not on file  . Number of children: 2  . Years of education: Not on file  . Highest education level: Not on file  Occupational History  . Not on file  Social Needs  . Financial resource strain: Not on file  . Food insecurity:    Worry: Not on file    Inability: Not on file  . Transportation needs:    Medical: No    Non-medical: No  Tobacco Use  . Smoking status: Current Every Day Smoker    Packs/day: 2.00    Types: Cigarettes  . Smokeless tobacco: Never Used    Substance and Sexual Activity  . Alcohol use: Yes    Alcohol/week: 21.0 standard drinks    Types: 21 Standard drinks or equivalent per week    Comment: 1-2 drinks daily (mixed vodka drinks); smokes e-cigs  . Drug use: No  . Sexual activity: Not on file  Lifestyle  . Physical activity:    Days per week: Not on file    Minutes per session: Not on file  . Stress: Not on file  Relationships  . Social connections:    Talks on phone: Not on file    Gets together: Not on file    Attends religious service: Not on file    Active member of club or organization: Not on file    Attends meetings of clubs or organizations: Not on file    Relationship status: Not on file  Other Topics Concern  . Not on file  Social History Narrative  . Not on file     Family History: The patient's family history includes Breast cancer in her maternal aunt; Breast cancer (age of onset: 60) in an other family member; Breast cancer (age of onset: 79) in her sister; COPD in her mother; Colon polyps in her father; Prostate cancer (age of onset: 76) in her father. There is no history of Colon cancer.  ROS:   Please see the history of present illness.    All other systems reviewed and are negative.  EKGs/Labs/Other Studies Reviewed:    EKG:  EKG is ordered today.  The ekg ordered today demonstrates sinus tachycardia 119 bpm, rightward axis, nonspecific ST abnormality.  Recent Labs: 10/25/2017: ALT 10 11/11/2017: BUN 14; Creatinine, Ser 0.83; Hemoglobin 12.5; Platelets 282; Potassium 3.7; Sodium 136  Recent Lipid Panel No results found for: CHOL, TRIG, HDL, CHOLHDL, VLDL, LDLCALC, LDLDIRECT  Physical Exam:    VS:  BP 122/90   Pulse (!) 119   Ht 5\' 3"  (1.6 m)   Wt 161 lb 12.8 oz (73.4 kg)   SpO2 97%   BMI 28.66 kg/m     Wt Readings from Last 3 Encounters:  03/14/18 161 lb 12.8 oz (73.4 kg)  12/05/17 150 lb 3.2 oz (68.1 kg)  11/15/17 152 lb 1.9 oz (69 kg)     GEN: Well nourished, well developed in no  acute distress HEENT: Normal NECK: No JVD; No carotid bruits LYMPHATICS: No lymphadenopathy CARDIAC: Tachycardic and regular, no murmurs, rubs, gallops RESPIRATORY:  Clear to auscultation without rales, wheezing or rhonchi  ABDOMEN: Soft, non-tender, non-distended MUSCULOSKELETAL:  No edema; No deformity  SKIN: Warm and dry NEUROLOGIC:  Alert and oriented  x 3 PSYCHIATRIC:  Normal affect   ASSESSMENT:    1. Tachycardia    PLAN:    In order of problems listed above:  1. Available data is reviewed.  The patient appears to have sinus tachycardia at rest.  In reviewing her most recent vital signs, she had a normal resting heart rate previously.  It seems like over the last few months her heart rate has been elevated without clear etiology.  Her thyroid function has been checked and is within normal limits.  Hemoglobin is normal.  I do not see any clear lab abnormality to account for her tachycardia.  I recommended that we check an echocardiogram to evaluate LV function and presence of any structural heart abnormality.  We will also check a Holter monitor to evaluate for significant arrhythmia and also evaluate her daily heart rate range.  The patient appears to be on appropriate therapy for her hypertension.  In reviewing her medications, she was started on venlafaxine about 3 months ago.  There is a 2% risk of tachycardia with this.  I asked her to touch base with her primary physician or psychiatrist about weaning off of venlafaxine as it is a possible etiology of her tachycardia.  Will arrange follow-up in about 6 weeks with an APP after her studies are completed and to reassess her heart rate.   Medication Adjustments/Labs and Tests Ordered: Current medicines are reviewed at length with the patient today.  Concerns regarding medicines are outlined above.  Orders Placed This Encounter  Procedures  . HOLTER MONITOR - 24 HOUR  . EKG 12-Lead  . ECHOCARDIOGRAM COMPLETE   No orders of the  defined types were placed in this encounter.   Patient Instructions  Medication Instructions:  1) Please call Noelle Redmon to discuss weaning off Effexor.  Labwork: None  Testing/Procedures: Your provider has requested that you have an echocardiogram. Echocardiography is a painless test that uses sound waves to create images of your heart. It provides your doctor with information about the size and shape of your heart and how well your heart's chambers and valves are working. This procedure takes approximately one hour. There are no restrictions for this procedure.    Your physician has recommended that you wear a holter monitor. Holter monitors are medical devices that record the heart's electrical activity. Doctors most often use these monitors to diagnose arrhythmias. Arrhythmias are problems with the speed or rhythm of the heartbeat. The monitor is a small, portable device. You can wear one while you do your normal daily activities. This is usually used to diagnose what is causing palpitations/syncope (passing out).  Follow-Up: Your provider recommends that you schedule a follow-up appointment in 6 weeks with Dr. Antionette Char assistant, Richardson Dopp.      Signed, Sherren Mocha, MD  03/14/2018 5:22 PM    [facility]

## 2018-03-14 NOTE — Patient Instructions (Signed)
Medication Instructions:  1) Please call Noelle Redmon to discuss weaning off Effexor.  Labwork: None  Testing/Procedures: Your provider has requested that you have an echocardiogram. Echocardiography is a painless test that uses sound waves to create images of your heart. It provides your doctor with information about the size and shape of your heart and how well your heart's chambers and valves are working. This procedure takes approximately one hour. There are no restrictions for this procedure.    Your physician has recommended that you wear a holter monitor. Holter monitors are medical devices that record the heart's electrical activity. Doctors most often use these monitors to diagnose arrhythmias. Arrhythmias are problems with the speed or rhythm of the heartbeat. The monitor is a small, portable device. You can wear one while you do your normal daily activities. This is usually used to diagnose what is causing palpitations/syncope (passing out).  Follow-Up: Your provider recommends that you schedule a follow-up appointment in 6 weeks with Dr. Antionette Char assistant, Richardson Dopp.

## 2018-03-22 ENCOUNTER — Ambulatory Visit (INDEPENDENT_AMBULATORY_CARE_PROVIDER_SITE_OTHER): Payer: Self-pay | Admitting: Orthopaedic Surgery

## 2018-03-27 ENCOUNTER — Ambulatory Visit (HOSPITAL_COMMUNITY): Payer: Medicare Other | Attending: Cardiovascular Disease

## 2018-03-27 ENCOUNTER — Ambulatory Visit (INDEPENDENT_AMBULATORY_CARE_PROVIDER_SITE_OTHER): Payer: Medicare Other

## 2018-03-27 DIAGNOSIS — R000 Tachycardia, unspecified: Secondary | ICD-10-CM

## 2018-03-27 DIAGNOSIS — F17200 Nicotine dependence, unspecified, uncomplicated: Secondary | ICD-10-CM | POA: Diagnosis not present

## 2018-03-27 DIAGNOSIS — I10 Essential (primary) hypertension: Secondary | ICD-10-CM | POA: Insufficient documentation

## 2018-03-28 ENCOUNTER — Telehealth: Payer: Self-pay | Admitting: Cardiovascular Disease

## 2018-03-28 NOTE — Telephone Encounter (Signed)
-----   Message from Sherren Mocha, MD sent at 03/27/2018  4:36 PM EST ----- Echo study looks good with normal cardiac function and no significant heart valve disease

## 2018-03-28 NOTE — Telephone Encounter (Signed)
Reviewed results with patient who verbalized understanding. 

## 2018-03-28 NOTE — Telephone Encounter (Signed)
Follow Up: ° ° ° °Returning your call,concerning her Echo results.  °

## 2018-03-29 ENCOUNTER — Ambulatory Visit (INDEPENDENT_AMBULATORY_CARE_PROVIDER_SITE_OTHER): Payer: Medicare Other

## 2018-03-29 ENCOUNTER — Encounter (INDEPENDENT_AMBULATORY_CARE_PROVIDER_SITE_OTHER): Payer: Self-pay | Admitting: Orthopaedic Surgery

## 2018-03-29 ENCOUNTER — Ambulatory Visit (INDEPENDENT_AMBULATORY_CARE_PROVIDER_SITE_OTHER): Payer: Medicare Other | Admitting: Orthopaedic Surgery

## 2018-03-29 VITALS — BP 151/92 | HR 99 | Ht 62.0 in | Wt 155.0 lb

## 2018-03-29 DIAGNOSIS — M1712 Unilateral primary osteoarthritis, left knee: Secondary | ICD-10-CM

## 2018-03-29 DIAGNOSIS — M25562 Pain in left knee: Secondary | ICD-10-CM

## 2018-03-29 MED ORDER — BUPIVACAINE HCL 0.5 % IJ SOLN
2.0000 mL | INTRAMUSCULAR | Status: AC | PRN
  Administered 2018-03-29: 2 mL via INTRA_ARTICULAR

## 2018-03-29 MED ORDER — METHYLPREDNISOLONE ACETATE 40 MG/ML IJ SUSP
80.0000 mg | INTRAMUSCULAR | Status: AC | PRN
  Administered 2018-03-29: 80 mg

## 2018-03-29 MED ORDER — LIDOCAINE HCL 1 % IJ SOLN
2.0000 mL | INTRAMUSCULAR | Status: AC | PRN
  Administered 2018-03-29: 2 mL

## 2018-03-29 NOTE — Progress Notes (Signed)
Office Visit Note   Patient: Mindy Young           Date of Birth: 1945/09/03           MRN: [id number] Visit Date: 03/29/2018              Requested by: Lennie Odor, PA-C [address] [place], [place] [address] PCP: Lennie Odor, PA-C   Assessment & Plan: Visit Diagnoses:  1. Left knee pain, unspecified chronicity   2. Unilateral primary osteoarthritis, left knee     Plan: Osteoarthritis left knee.  Films demonstrate tricompartmental degenerative changes.  Will inject with cortisone.  Long discussion regarding diagnosis and subsequent treatment options including Visco supplementation, ice heat, NSAIDs and bracing will see back as needed  Follow-Up Instructions: Return if symptoms worsen or fail to improve.   Orders:  Orders Placed This Encounter  Procedures  . Large Joint Inj: L knee  . XR KNEE 3 VIEW LEFT   No orders of the defined types were placed in this encounter.     Procedures: Large Joint Inj: L knee on 03/29/2018 1:30 PM Indications: pain and diagnostic evaluation Details: 25 G 1.5 in needle, anteromedial approach  Arthrogram: No  Medications: 2 mL lidocaine 1 %; 2 mL bupivacaine 0.5 %; 80 mg methylPREDNISolone acetate 40 MG/ML Procedure, treatment alternatives, risks and benefits explained, specific risks discussed. Consent was given by the patient. Patient was prepped and draped in the usual sterile fashion.       Clinical Data: No additional findings.   Subjective: Chief Complaint  Patient presents with  . Left Knee - Pain  Patient presents today with left knee pain X5weeks. Patient fell going up stairs a couple months ago, but seemed fine after the fall. Her knee pain is located throughout. She said that it throbs constantly, and wakes her at night. Her knee locks up occasionally and gives way. She does not take anything for pain. Patient states that this same thing happened years ago and was also treated by Dr.Ahleah Simko then. She  had a cortisone injection then and that seemed to help. No related fever chills shortness of breath or chest pain.  Not using any ambulatory aid.  Has a history of labile hypo-and hypertension and uses a service dog HPI  Review of Systems   Objective: Vital Signs: BP (!) 151/92   Pulse 99   Ht 5\' 2"  (1.575 m)   Wt 155 lb (70.3 kg)   BMI 28.35 kg/m   Physical Exam Constitutional:      Appearance: She is well-developed.  Eyes:     Pupils: Pupils are equal, round, and reactive to light.  Pulmonary:     Effort: Pulmonary effort is normal.  Skin:    General: Skin is warm and dry.  Neurological:     Mental Status: She is alert and oriented to person, place, and time.  Psychiatric:        Behavior: Behavior normal.     Ortho Exam awake alert and oriented x3.  Comfortable sitting.  Full extension left knee.  No effusion.  Some patellar crepitation.  Predominately medial joint pain-mild.  No popliteal fullness.  No calf pain.  No distal edema.  Motor exam intact.  Straight leg raise negative.  Painless range of motion both hips  Specialty Comments:  No specialty comments available.  Imaging: Xr Knee 3 View Left  Result Date: 03/29/2018 Films of the left knee were obtained in 3 projections standing.  There are degenerative changes in all 3 compartments of but tickly the medial compartment with there is significant narrowing.  There is probably 1 degree of varus.  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  There is small peripheral osteophytes both medially and laterally and at the patellofemoral joint.  No acute change.    PMFS History: Patient Active Problem List   Diagnosis Date Noted  . Left knee pain 03/29/2018  . Unilateral primary osteoarthritis, left knee 03/29/2018  . Genetic testing 01/24/2018  . Family history of breast cancer   . Family history of prostate cancer   . Malignant neoplasm of lower-inner quadrant of right breast of  female, estrogen receptor positive ([facility]) 10/24/2017  . Anemia 06/29/2011  . Hypertension   . Hyponatremia 06/25/2011  . Transaminitis 06/25/2011  . Alcohol abuse 06/25/2011  . Cough 06/25/2011  . Cigarette smoker 06/25/2011   Past Medical History:  Diagnosis Date  . Allergy   . Anemia 06/29/2011  . Anxiety   . Arthritis   . Breast cancer ([facility])    right breast  . C1 cervical fracture ([facility]) 02/1997  . Cancer ([facility])    melanoma  . Depression   . Family history of breast cancer   . Family history of prostate cancer   . GERD (gastroesophageal reflux disease)   . Hypertension     Family History  Problem Relation Age of Onset  . COPD Mother   . Prostate cancer Father 55       seed implant for treatment  . Colon polyps Father        'a few'  . Breast cancer Sister 56  . Breast cancer Maternal Aunt        dx >50  . Breast cancer Other 35       bilateral  . Colon cancer Neg Hx     Past Surgical History:  Procedure Laterality Date  . ABDOMINAL HYSTERECTOMY    . ASPIRATION OF ABSCESS Right 11/20/2017   Procedure: ASPIRATION OF RIGHT AXILLARY SEROMA;  Surgeon: Rolm Bookbinder, MD;  Location: [facility];  Service: General;  Laterality: Right;  . AUGMENTATION MAMMAPLASTY    . BREAST LUMPECTOMY WITH RADIOACTIVE SEED AND SENTINEL LYMPH NODE BIOPSY Right 11/02/2017   Procedure: BREAST LUMPECTOMY WITH RADIOACTIVE SEED AND SENTINEL LYMPH NODE BIOPSY;  Surgeon: Rolm Bookbinder, MD;  Location: [facility];  Service: General;  Laterality: Right;  . CHOLECYSTECTOMY    . collarbone    . INCONTINENCE SURGERY    . KNEE SURGERY     removal of cyst, repair of cartiledge  . LAPAROSCOPY     for endometriosis  . RE-EXCISION OF BREAST LUMPECTOMY Right 11/20/2017   Procedure: RE-EXCISION OF RIGHT BREAST MARGINS;  Surgeon: Rolm Bookbinder, MD;  Location: [facility];  Service: General;  Laterality: Right;  . ROTATOR CUFF REPAIR     left   Social  History   Occupational History  . Not on file  Tobacco Use  . Smoking status: Current Every Day Smoker    Packs/day: 2.00    Types: Cigarettes  . Smokeless tobacco: Never Used  Substance and Sexual Activity  . Alcohol use: Yes    Alcohol/week: 21.0 standard drinks    Types: 21 Standard drinks or equivalent per week    Comment: 1-2 drinks daily (mixed vodka drinks); smokes e-cigs  . Drug use: No  . Sexual activity: Not on file

## 2018-04-11 ENCOUNTER — Telehealth: Payer: Self-pay

## 2018-04-11 NOTE — Telephone Encounter (Signed)
Spoke with patient reminding of SCP visit with NP on 04/20/18 at 10 am.  Patient said she will come to appt.

## 2018-04-16 NOTE — Progress Notes (Signed)
Mindy Young was seen today in neurologic consultation at the request of Melida Quitter, MD.  The consultation is for the evaluation of tardive dyskinesia.  Prior records are reviewed, including records from ENT as well as ED records.  Patient had a consult with Dr. Redmond Baseman on March 28, 2018 with complaints of feeling that her tongue is thick and constant movement of the tongue within the mouth.  Symptom has been going on for at least since September.  She initially thought that it was related to her breast cancer surgery and lymphedema.   Patient has been on [place] for about a month.  Her psychiatrist is with [facility] - she sees a PA, Jobie Quaker. She knows that she was on abilify in the past but thinks it was 3 years ago.  She cannot recall other antipsychotics.   Emergency room records from April 13, 2017 were reviewed.  She had presented that day because of fall and slipped in her [place].  On her examination, they did note lipsmacking and emergency room records indicate that they gave her information on tardive dyskinesia at discharge.  It is also important to know that ED records indicate patient had daily alcohol use, and the fall was in the setting of alcohol intoxication (although blood alcohol level was not drawn that day).  I have also reviewed cardiology records from March 14, 2018.  She was following regarding sinus tachycardia.  She was asked to follow-up with prescribing physician of venlafaxine and consider weaning off of that.  She has done that and thinks that it helped with the tachycardia.    I have reviewed the patient's CT of the brain dated November 11, 2017.  It was unremarkable.   PREVIOUS MEDICATIONS: rexulti, abilify, ? Others, states that takes her xanax twice per week  ALLERGIES:   Allergies  Allergen Reactions  . Percocet [Oxycodone-Acetaminophen] Other (See Comments)    "bugs crawling on me"    CURRENT MEDICATIONS:  Outpatient Encounter Medications  as of 04/17/2018  Medication Sig  . ALPRAZolam (XANAX) 0.5 MG tablet Take 0.5 mg by mouth 2 (two) times daily as needed. For anxiety  . amLODipine (NORVASC) 5 MG tablet Take 5 mg by mouth daily.  . cetirizine (ZYRTEC) 10 MG tablet Take 10 mg by mouth as needed for allergies.  . cloNIDine (CATAPRES) 0.1 MG tablet Take 1 tablet by mouth 2 (two) times daily.  Marland Kitchen doxycycline (VIBRAMYCIN) 100 MG capsule Take 100 mg by mouth 2 (two) times daily.  Marland Kitchen letrozole (FEMARA) 2.5 MG tablet Take 1 tablet (2.5 mg total) by mouth daily.  . metoprolol succinate (TOPROL-XL) 100 MG 24 hr tablet Take 1 tablet by mouth daily.  Marland Kitchen omeprazole (PRILOSEC) 40 MG capsule Take 1 capsule by mouth 2 (two) times daily.  Marland Kitchen oxybutynin (DITROPAN-XL) 10 MG 24 hr tablet Take 1 tablet by mouth daily.  . [DISCONTINUED] TRINTELLIX 20 MG TABS Take 1 tablet by mouth daily.   No facility-administered encounter medications on file as of 04/17/2018.     PAST MEDICAL HISTORY:   Past Medical History:  Diagnosis Date  . Allergy   . Anemia 06/29/2011  . Anxiety   . Arthritis   . Breast cancer ()    right breast  . C1 cervical fracture ([facility]) 02/1997  . Cancer ([facility])    melanoma  . Depression   . Family history of breast cancer   . Family history of prostate cancer   . GERD (gastroesophageal reflux  disease)   . Hypertension     PAST SURGICAL HISTORY:   Past Surgical History:  Procedure Laterality Date  . ABDOMINAL HYSTERECTOMY    . ASPIRATION OF ABSCESS Right 11/20/2017   Procedure: ASPIRATION OF RIGHT AXILLARY SEROMA;  Surgeon: Rolm Bookbinder, MD;  Location: [facility];  Service: General;  Laterality: Right;  . AUGMENTATION MAMMAPLASTY    . BREAST LUMPECTOMY WITH RADIOACTIVE SEED AND SENTINEL LYMPH NODE BIOPSY Right 11/02/2017   Procedure: BREAST LUMPECTOMY WITH RADIOACTIVE SEED AND SENTINEL LYMPH NODE BIOPSY;  Surgeon: Rolm Bookbinder, MD;  Location: [facility];  Service: General;   Laterality: Right;  . CHOLECYSTECTOMY    . collarbone    . INCONTINENCE SURGERY    . KNEE SURGERY     removal of cyst, repair of cartiledge  . LAPAROSCOPY     for endometriosis  . RE-EXCISION OF BREAST LUMPECTOMY Right 11/20/2017   Procedure: RE-EXCISION OF RIGHT BREAST MARGINS;  Surgeon: Rolm Bookbinder, MD;  Location: [facility];  Service: General;  Laterality: Right;  . ROTATOR CUFF REPAIR     left    SOCIAL HISTORY:   Social History   Socioeconomic History  . Marital status: Married    Spouse name: Not on file  . Number of children: 2  . Years of education: Not on file  . Highest education level: Not on file  Occupational History  . Not on file  Social Needs  . Financial resource strain: Not on file  . Food insecurity:    Worry: Not on file    Inability: Not on file  . Transportation needs:    Medical: No    Non-medical: No  Tobacco Use  . Smoking status: Current Every Day Smoker    Packs/day: 2.00    Types: Cigarettes  . Smokeless tobacco: Never Used  . Tobacco comment: e-cigs  Substance and Sexual Activity  . Alcohol use: Yes    Alcohol/week: 21.0 standard drinks    Types: 21 Standard drinks or equivalent per week    Comment: 1-2 drinks daily (mixed vodka drinks)  . Drug use: No  . Sexual activity: Not on file  Lifestyle  . Physical activity:    Days per week: Not on file    Minutes per session: Not on file  . Stress: Not on file  Relationships  . Social connections:    Talks on phone: Not on file    Gets together: Not on file    Attends religious service: Not on file    Active member of club or organization: Not on file    Attends meetings of clubs or organizations: Not on file    Relationship status: Not on file  . Intimate partner violence:    Fear of current or ex partner: No    Emotionally abused: No    Physically abused: No    Forced sexual activity: No  Other Topics Concern  . Not on file  Social History Narrative  . Not  on file    FAMILY HISTORY:   Family Status  Relation Name Status  . Mother  Deceased  . Father  Alive  . Sister  (Not Specified)  . Mat Aunt  (Not Specified)  . Other niece Alive  . Neg Hx  (Not Specified)    ROS:  Review of Systems  Constitutional: Negative.   HENT: Negative.   Eyes: Negative.   Respiratory: Positive for cough, sputum production (on abx now) and shortness  of breath.   Cardiovascular: Positive for palpitations.  Gastrointestinal: Negative.   Genitourinary: Negative.   Skin: Negative.   Endo/Heme/Allergies: Negative.     PHYSICAL EXAMINATION:    VITALS:   Vitals:   04/17/18 0944  BP: 118/74  Pulse: 90  SpO2: (!) 87%  Weight: 157 lb (71.2 kg)  Height: 5' 2.5" (1.588 m)     Pts service dog with her in the room (apparently for malignant HTN) GEN:  Normal appears female in no acute distress.  Appears stated age. HEENT:  Normocephalic, atraumatic. The mucous membranes are moist. The superficial temporal arteries are without ropiness or tenderness. Cardiovascular: Regular rate and rhythm. Lungs: Clear to auscultation bilaterally. Neck/Heme: There are no carotid bruits noted bilaterally.  NEUROLOGICAL: Orientation:  The patient is alert and oriented x 3.  Fund of knowledge is appropriate.  Recent and remote memory intact.  Attention span and concentration normal.  Repeats and names without difficulty. Cranial nerves: There is good facial symmetry. The pupils are equal round and reactive to light bilaterally. Fundoscopic exam reveals clear disc margins bilaterally. Extraocular muscles are intact and visual fields are full to confrontational testing. Speech is fluent and clear. Soft palate rises symmetrically and there is no tongue deviation. Hearing is intact to conversational tone. Tone: Tone is good throughout. Sensation: Sensation is intact to light touch and pinprick throughout (facial, trunk, extremities). Vibration is intact at the bilateral big toe.  There is no extinction with double simultaneous stimulation. There is no sensory dermatomal level identified. Coordination:  The patient has no difficulty with RAM's or FNF bilaterally. Motor: Strength is 5/5 in the bilateral upper and lower extremities.  Shoulder shrug is equal and symmetric. There is no pronator drift.  There are no fasciculations noted. DTR's: Deep tendon reflexes are 2/4 at the bilateral biceps, triceps, brachioradialis, patella and achilles.  Plantar responses are downgoing bilaterally. Gait and Station: The patient is able to ambulate without difficulty. The patient is able to heel toe walk without any difficulty. The patient is able to ambulate in a tandem fashion. The patient is able to stand in the Romberg position. Abnormal movements:  She has jaw movements.  She has tongue movements within the mouth.  She has some puckering of the lips when talking.  Tongue does not protrude outside of the mouth.   IMPRESSION/PLAN  1. Tardive dyskinesia  -The patients symptoms are most consistent with tardive dyskinesia, likely due to [place] and has been on Abilify in the past as well.  Pt is now off of the medication.  TD is a heterogeneous syndrome depending on a subtle balance between several neurotransmitters in the brain, including DA receptor blockade and hypersensitivity of DA and GABA receptors.  She and I talked about various treatments, including Ingrezza, but ultimately the patient states that she wants no further medication.  This is a reasonable approach.  She will let me know if she changes her mind.  -Patient last had an EKG on March 14, 2018.  QT/QTc was 328/461.  2.  Alcohol abuse  -denies heavy use today, but encouraged her to cut down/wean/discontinue under the guidance of her PCP  3.  Tobacco abuse  -Currently smoking 1 packs/day    - Patient was informed of the dangers of tobacco abuse including stroke, cancer, and MI, as well as benefits of tobacco  cessation.  - Patient not willing to quit at this time but is considering it, especially given that she currently has bronchitis and is  already coughing significantly.  - Approximately 3 mins were spent counseling patient cessation techniques. We discussed various methods to help quit smoking, including deciding on a date to quit, joining a support group, pharmacological agents- nicotine gum/patch/lozenges, chantix,   -Told her to follow-up with primary care to assess.  4.  Follow-up as needed.   Cc:  Lennie Odor, PA-C

## 2018-04-17 ENCOUNTER — Ambulatory Visit: Payer: Medicare Other | Admitting: Neurology

## 2018-04-17 ENCOUNTER — Encounter: Payer: Self-pay | Admitting: Neurology

## 2018-04-17 VITALS — BP 118/74 | HR 90 | Ht 62.5 in | Wt 157.0 lb

## 2018-04-17 DIAGNOSIS — Z720 Tobacco use: Secondary | ICD-10-CM

## 2018-04-17 DIAGNOSIS — G2401 Drug induced subacute dyskinesia: Secondary | ICD-10-CM | POA: Diagnosis not present

## 2018-04-17 DIAGNOSIS — F17210 Nicotine dependence, cigarettes, uncomplicated: Secondary | ICD-10-CM

## 2018-04-17 DIAGNOSIS — Z7289 Other problems related to lifestyle: Secondary | ICD-10-CM

## 2018-04-17 DIAGNOSIS — Z789 Other specified health status: Secondary | ICD-10-CM

## 2018-04-17 NOTE — Patient Instructions (Signed)
Let me know if you decide you want medication.  In the meantime, I want you to really work on decreasing the tobacco and alcohol.  Your primary care can guide you on that!  It was good to see you today!

## 2018-04-20 ENCOUNTER — Encounter: Payer: Self-pay | Admitting: Adult Health

## 2018-04-20 ENCOUNTER — Inpatient Hospital Stay: Payer: Medicare Other | Attending: Adult Health | Admitting: Adult Health

## 2018-04-25 ENCOUNTER — Ambulatory Visit: Payer: Medicare Other | Admitting: Physician Assistant

## 2018-05-08 ENCOUNTER — Ambulatory Visit: Payer: Medicare Other | Admitting: Interventional Cardiology

## 2018-05-08 NOTE — Progress Notes (Deleted)
Cardiology Office Note:    Date:  05/08/2018   ID:  Mindy Young, DOB Oct 26, 1945, MRN [id number]  PCP:  Lennie Odor, PA-C  Cardiologist:  Sherren Mocha, MD *** Electrophysiologist:  None   Referring MD: Lennie Odor, PA-C   No chief complaint on file. ***  History of Present Illness:    Mindy Young is a 73 y.o. female with breast cancer, hypertension, tobacco abuse.  She was evaluated by Dr. Burt Knack in 02/2018 for sinus tachycardia.  An echo was obtained and demonstrated normal EF.  A Holter Monitor demonstrated normal sinus rhythm, short runs of PSVT and no atrial fibrillation.  Dr. Burt Knack did ask her to discuss with her PCP or psychiatrist about coming off of Venlafaxine which can contribute to tachycardia.  ***  Mindy Young ***  Prior CV studies:   The following studies were reviewed today:  Echo 03/27/18  1. The left ventricle has normal systolic function of 35-36%. The cavity size is normal. There is no increased left ventricular wall thickness. Echo evidence of impaired diastolic relaxation Normal left ventricular filling pressures.  2. The right ventricle has normal systolic function. The cavity in normal in size. There is no increase in right ventricular wall thickness. Right ventricular systolic pressure normal with an estimated pressure of 29.0 mmHg.  3. The mitral valve is normal in structure.  4. The aortic valve is normal in structure and function.  Holter 03/27/2018 Sinus rhythm with an average heart rate of 84 bpm Short runs of pSVT but no sustained arrhythmia Rare PVC's No atrial fibrillation or flutter No bradycardic events   Past Medical History:  Diagnosis Date  . Allergy   . Anemia 06/29/2011  . Anxiety   . Arthritis   . Breast cancer ([facility])    right breast  . C1 cervical fracture ([facility]) 02/1997  . Cancer ([facility])    melanoma  . Depression   . Family history of breast cancer   . Family history of prostate cancer   . GERD (gastroesophageal reflux  disease)   . Hypertension    Surgical Hx: The patient  has a past surgical history that includes Cholecystectomy; collarbone; Rotator cuff repair; Knee surgery; Incontinence surgery; laparoscopy; Augmentation mammaplasty; Breast lumpectomy with radioactive seed and sentinel lymph node biopsy (Right, 11/02/2017); Abdominal hysterectomy; Re-excision of breast lumpectomy (Right, 11/20/2017); and Aspiration of abscess (Right, 11/20/2017).   Current Medications: No outpatient medications have been marked as taking for the 05/09/18 encounter (Appointment) with Richardson Dopp T, PA-C.     Allergies:   Percocet [oxycodone-acetaminophen]   Social History   Tobacco Use  . Smoking status: Current Every Day Smoker    Packs/day: 1.00    Types: Cigarettes  . Smokeless tobacco: Never Used  . Tobacco comment: e-cigs  Substance Use Topics  . Alcohol use: Yes    Alcohol/week: 21.0 standard drinks    Types: 21 Standard drinks or equivalent per week    Comment: 2 per weekend day; denies during the week (not consistent with prior hx)  . Drug use: No     Family Hx: The patient's family history includes Breast cancer in her maternal aunt; Breast cancer (age of onset: 68) in an other family member; Breast cancer (age of onset: 62) in her sister; COPD in her mother; Colon polyps in her father; Prostate cancer (age of onset: 38) in her father. There is no history of Colon cancer.  ROS:   Please see the history of present illness.  ROS All other systems reviewed and are negative.   EKGs/Labs/Other Test Reviewed:    EKG:  EKG is *** ordered today.  The ekg ordered today demonstrates ***  Recent Labs: 10/25/2017: ALT 10 11/11/2017: BUN 14; Creatinine, Ser 0.83; Hemoglobin 12.5; Platelets 282; Potassium 3.7; Sodium 136   Recent Lipid Panel No results found for: CHOL, TRIG, HDL, CHOLHDL, LDLCALC, LDLDIRECT  Physical Exam:    VS:  There were no vitals taken for this visit.    Wt Readings from Last 3  Encounters:  04/17/18 157 lb (71.2 kg)  03/29/18 155 lb (70.3 kg)  03/14/18 161 lb 12.8 oz (73.4 kg)     ***Physical Exam  ASSESSMENT & PLAN:    No diagnosis found.***  Dispo:  No follow-ups on file.   Medication Adjustments/Labs and Tests Ordered: Current medicines are reviewed at length with the patient today.  Concerns regarding medicines are outlined above.  Tests Ordered: No orders of the defined types were placed in this encounter.  Medication Changes: No orders of the defined types were placed in this encounter.   Signed, Richardson Dopp, PA-C  05/08/2018 10:09 PM    [facility] [address], [place], [place]  [address] Phone: [phone]; Fax: [phone]

## 2018-05-09 ENCOUNTER — Ambulatory Visit: Payer: Medicare Other | Admitting: Physician Assistant

## 2018-05-10 ENCOUNTER — Encounter: Payer: Self-pay | Admitting: Physician Assistant

## 2018-07-19 ENCOUNTER — Other Ambulatory Visit: Payer: Self-pay | Admitting: Gastroenterology

## 2018-07-23 ENCOUNTER — Other Ambulatory Visit: Payer: Self-pay | Admitting: Hematology and Oncology

## 2018-10-12 ENCOUNTER — Other Ambulatory Visit: Payer: Self-pay | Admitting: Hematology and Oncology

## 2018-10-12 ENCOUNTER — Other Ambulatory Visit: Payer: Self-pay | Admitting: Gastroenterology

## 2018-10-23 ENCOUNTER — Other Ambulatory Visit: Payer: Self-pay | Admitting: Obstetrics & Gynecology

## 2018-10-23 DIAGNOSIS — Z9889 Other specified postprocedural states: Secondary | ICD-10-CM

## 2018-10-30 ENCOUNTER — Other Ambulatory Visit: Payer: Self-pay

## 2018-10-30 ENCOUNTER — Other Ambulatory Visit: Payer: Self-pay | Admitting: Obstetrics & Gynecology

## 2018-10-30 ENCOUNTER — Ambulatory Visit
Admission: RE | Admit: 2018-10-30 | Discharge: 2018-10-30 | Disposition: A | Discharge status: Home / Self Care | Payer: Medicare Other | Source: Ambulatory Visit | Attending: Obstetrics & Gynecology | Admitting: Obstetrics & Gynecology

## 2018-10-30 DIAGNOSIS — Z9889 Other specified postprocedural states: Secondary | ICD-10-CM

## 2018-11-22 ENCOUNTER — Other Ambulatory Visit: Payer: Self-pay

## 2018-11-22 DIAGNOSIS — Z20822 Contact with and (suspected) exposure to covid-19: Secondary | ICD-10-CM

## 2018-11-23 LAB — NOVEL CORONAVIRUS, NAA: SARS-CoV-2, NAA: NOT DETECTED

## 2018-11-24 ENCOUNTER — Encounter (HOSPITAL_BASED_OUTPATIENT_CLINIC_OR_DEPARTMENT_OTHER): Payer: Self-pay

## 2018-11-24 ENCOUNTER — Emergency Department (HOSPITAL_BASED_OUTPATIENT_CLINIC_OR_DEPARTMENT_OTHER): Payer: Medicare Other

## 2018-11-24 ENCOUNTER — Inpatient Hospital Stay (HOSPITAL_BASED_OUTPATIENT_CLINIC_OR_DEPARTMENT_OTHER)
Admission: EM | Admit: 2018-11-24 | Discharge: 2018-11-26 | DRG: 536 | Disposition: A | Discharge status: Home Health | Payer: Medicare Other | Attending: Family Medicine | Admitting: Family Medicine

## 2018-11-24 ENCOUNTER — Other Ambulatory Visit: Payer: Self-pay

## 2018-11-24 DIAGNOSIS — F1010 Alcohol abuse, uncomplicated: Secondary | ICD-10-CM | POA: Diagnosis present

## 2018-11-24 DIAGNOSIS — F329 Major depressive disorder, single episode, unspecified: Secondary | ICD-10-CM | POA: Diagnosis present

## 2018-11-24 DIAGNOSIS — E871 Hypo-osmolality and hyponatremia: Secondary | ICD-10-CM

## 2018-11-24 DIAGNOSIS — S32591A Other specified fracture of right pubis, initial encounter for closed fracture: Principal | ICD-10-CM | POA: Diagnosis present

## 2018-11-24 DIAGNOSIS — F419 Anxiety disorder, unspecified: Secondary | ICD-10-CM | POA: Diagnosis present

## 2018-11-24 DIAGNOSIS — S32509A Unspecified fracture of unspecified pubis, initial encounter for closed fracture: Secondary | ICD-10-CM | POA: Diagnosis present

## 2018-11-24 DIAGNOSIS — Z20828 Contact with and (suspected) exposure to other viral communicable diseases: Secondary | ICD-10-CM | POA: Diagnosis present

## 2018-11-24 DIAGNOSIS — W1839XA Other fall on same level, initial encounter: Secondary | ICD-10-CM | POA: Diagnosis present

## 2018-11-24 DIAGNOSIS — S329XXA Fracture of unspecified parts of lumbosacral spine and pelvis, initial encounter for closed fracture: Secondary | ICD-10-CM | POA: Diagnosis present

## 2018-11-24 DIAGNOSIS — M25551 Pain in right hip: Secondary | ICD-10-CM | POA: Diagnosis not present

## 2018-11-24 DIAGNOSIS — Z170 Estrogen receptor positive status [ER+]: Secondary | ICD-10-CM

## 2018-11-24 DIAGNOSIS — Z79811 Long term (current) use of aromatase inhibitors: Secondary | ICD-10-CM

## 2018-11-24 DIAGNOSIS — Y93K1 Activity, walking an animal: Secondary | ICD-10-CM

## 2018-11-24 DIAGNOSIS — F17210 Nicotine dependence, cigarettes, uncomplicated: Secondary | ICD-10-CM | POA: Diagnosis present

## 2018-11-24 DIAGNOSIS — S32501A Unspecified fracture of right pubis, initial encounter for closed fracture: Secondary | ICD-10-CM

## 2018-11-24 DIAGNOSIS — T43595A Adverse effect of other antipsychotics and neuroleptics, initial encounter: Secondary | ICD-10-CM | POA: Diagnosis present

## 2018-11-24 DIAGNOSIS — S80211A Abrasion, right knee, initial encounter: Secondary | ICD-10-CM | POA: Diagnosis present

## 2018-11-24 DIAGNOSIS — Z79899 Other long term (current) drug therapy: Secondary | ICD-10-CM

## 2018-11-24 DIAGNOSIS — I10 Essential (primary) hypertension: Secondary | ICD-10-CM | POA: Diagnosis present

## 2018-11-24 DIAGNOSIS — Z85820 Personal history of malignant melanoma of skin: Secondary | ICD-10-CM

## 2018-11-24 DIAGNOSIS — G2401 Drug induced subacute dyskinesia: Secondary | ICD-10-CM | POA: Diagnosis present

## 2018-11-24 DIAGNOSIS — Z803 Family history of malignant neoplasm of breast: Secondary | ICD-10-CM

## 2018-11-24 DIAGNOSIS — Z853 Personal history of malignant neoplasm of breast: Secondary | ICD-10-CM

## 2018-11-24 DIAGNOSIS — Z90710 Acquired absence of both cervix and uterus: Secondary | ICD-10-CM

## 2018-11-24 DIAGNOSIS — S50311A Abrasion of right elbow, initial encounter: Secondary | ICD-10-CM | POA: Diagnosis present

## 2018-11-24 HISTORY — DX: Drug induced subacute dyskinesia: G24.01

## 2018-11-24 LAB — CBC WITH DIFFERENTIAL/PLATELET
Abs Immature Granulocytes: 0.07 10*3/uL (ref 0.00–0.07)
Basophils Absolute: 0.1 10*3/uL (ref 0.0–0.1)
Basophils Relative: 1 %
Eosinophils Absolute: 0 10*3/uL (ref 0.0–0.5)
Eosinophils Relative: 0 %
HCT: 40 % (ref 36.0–46.0)
Hemoglobin: 14.2 g/dL (ref 12.0–15.0)
Immature Granulocytes: 1 %
Lymphocytes Relative: 11 %
Lymphs Abs: 1.2 10*3/uL (ref 0.7–4.0)
MCH: 33.6 pg (ref 26.0–34.0)
MCHC: 35.5 g/dL (ref 30.0–36.0)
MCV: 94.6 fL (ref 80.0–100.0)
Monocytes Absolute: 1.2 10*3/uL — ABNORMAL HIGH (ref 0.1–1.0)
Monocytes Relative: 11 %
Neutro Abs: 8.5 10*3/uL — ABNORMAL HIGH (ref 1.7–7.7)
Neutrophils Relative %: 76 %
Platelets: 372 10*3/uL (ref 150–400)
RBC: 4.23 MIL/uL (ref 3.87–5.11)
RDW: 11.8 % (ref 11.5–15.5)
WBC: 11 10*3/uL — ABNORMAL HIGH (ref 4.0–10.5)
nRBC: 0 % (ref 0.0–0.2)

## 2018-11-24 LAB — BASIC METABOLIC PANEL
Anion gap: 16 — ABNORMAL HIGH (ref 5–15)
BUN: 10 mg/dL (ref 8–23)
CO2: 21 mmol/L — ABNORMAL LOW (ref 22–32)
Calcium: 9.4 mg/dL (ref 8.9–10.3)
Chloride: 89 mmol/L — ABNORMAL LOW (ref 98–111)
Creatinine, Ser: 0.56 mg/dL (ref 0.44–1.00)
GFR calc Af Amer: >60 mL/min (ref >60)
GFR calc non Af Amer: >60 mL/min (ref >60)
Glucose, Bld: 115 mg/dL — ABNORMAL HIGH (ref 70–99)
Potassium: 4.3 mmol/L (ref 3.5–5.1)
Sodium: 126 mmol/L — ABNORMAL LOW (ref 135–145)

## 2018-11-24 LAB — SARS CORONAVIRUS 2 BY RT PCR (HOSPITAL ORDER, PERFORMED IN ~~LOC~~ HOSPITAL LAB): SARS Coronavirus 2: NEGATIVE

## 2018-11-24 MED ORDER — ENOXAPARIN SODIUM 40 MG/0.4ML ~~LOC~~ SOLN
40.0000 mg | SUBCUTANEOUS | Status: DC
  Filled 2018-11-24 (×2): qty 0.4

## 2018-11-24 MED ORDER — VITAMIN B-1 100 MG PO TABS
100.0000 mg | ORAL_TABLET | Freq: Every day | ORAL | Status: DC
  Administered 2018-11-24: 100 mg via ORAL
  Filled 2018-11-24: qty 1

## 2018-11-24 MED ORDER — CLONIDINE HCL 0.1 MG PO TABS
0.1000 mg | ORAL_TABLET | Freq: Two times a day (BID) | ORAL | Status: DC
  Administered 2018-11-24 – 2018-11-26 (×4): 0.1 mg via ORAL
  Filled 2018-11-24 (×4): qty 1

## 2018-11-24 MED ORDER — VITAMIN B-1 100 MG PO TABS
100.0000 mg | ORAL_TABLET | Freq: Every day | ORAL | Status: DC
  Administered 2018-11-24 – 2018-11-26 (×3): 100 mg via ORAL
  Filled 2018-11-24 (×3): qty 1

## 2018-11-24 MED ORDER — SODIUM CHLORIDE 0.9 % IV SOLN
INTRAVENOUS | Status: DC
  Administered 2018-11-24 – 2018-11-25 (×2): via INTRAVENOUS

## 2018-11-24 MED ORDER — ADULT MULTIVITAMIN W/MINERALS CH
1.0000 | ORAL_TABLET | Freq: Every day | ORAL | Status: DC
  Administered 2018-11-24 – 2018-11-26 (×3): 1 via ORAL
  Filled 2018-11-24 (×3): qty 1

## 2018-11-24 MED ORDER — MORPHINE SULFATE (PF) 4 MG/ML IV SOLN
4.0000 mg | Freq: Once | INTRAVENOUS | Status: AC
  Administered 2018-11-24: 12:00:00 4 mg via INTRAVENOUS
  Filled 2018-11-24: qty 1

## 2018-11-24 MED ORDER — LORATADINE 10 MG PO TABS
10.0000 mg | ORAL_TABLET | Freq: Every day | ORAL | Status: DC
  Administered 2018-11-24 – 2018-11-26 (×3): 10 mg via ORAL
  Filled 2018-11-24 (×3): qty 1

## 2018-11-24 MED ORDER — AMLODIPINE BESYLATE 5 MG PO TABS
5.0000 mg | ORAL_TABLET | Freq: Every day | ORAL | Status: DC
  Administered 2018-11-24 – 2018-11-26 (×3): 5 mg via ORAL
  Filled 2018-11-24 (×3): qty 1

## 2018-11-24 MED ORDER — HYDROCODONE-ACETAMINOPHEN 5-325 MG PO TABS
1.0000 | ORAL_TABLET | ORAL | Status: DC | PRN
  Administered 2018-11-24 – 2018-11-26 (×8): 2 via ORAL
  Filled 2018-11-24 (×8): qty 2

## 2018-11-24 MED ORDER — LORAZEPAM 2 MG/ML IJ SOLN: 1.0000 mg | INTRAMUSCULAR | Status: DC | PRN

## 2018-11-24 MED ORDER — HYDROMORPHONE HCL 1 MG/ML IJ SOLN
0.5000 mg | INTRAMUSCULAR | Status: DC | PRN
  Administered 2018-11-24: 0.5 mg via INTRAVENOUS
  Filled 2018-11-24: qty 0.5

## 2018-11-24 MED ORDER — LORAZEPAM 1 MG PO TABS: 0.0000 mg | ORAL_TABLET | Freq: Two times a day (BID) | ORAL | Status: DC

## 2018-11-24 MED ORDER — ALPRAZOLAM 0.5 MG PO TABS: 0.5000 mg | ORAL_TABLET | Freq: Two times a day (BID) | ORAL | Status: DC | PRN

## 2018-11-24 MED ORDER — LORAZEPAM 1 MG PO TABS: 0.0000 mg | ORAL_TABLET | Freq: Four times a day (QID) | ORAL | Status: DC

## 2018-11-24 MED ORDER — LETROZOLE 2.5 MG PO TABS
2.5000 mg | ORAL_TABLET | Freq: Every day | ORAL | Status: DC
  Administered 2018-11-24 – 2018-11-26 (×3): 2.5 mg via ORAL
  Filled 2018-11-24 (×3): qty 1

## 2018-11-24 MED ORDER — ONDANSETRON HCL 4 MG PO TABS: 4.0000 mg | ORAL_TABLET | Freq: Four times a day (QID) | ORAL | Status: DC | PRN

## 2018-11-24 MED ORDER — SODIUM CHLORIDE 0.9 % IV BOLUS
1000.0000 mL | Freq: Once | INTRAVENOUS | Status: AC
  Administered 2018-11-24: 1000 mL via INTRAVENOUS

## 2018-11-24 MED ORDER — LORAZEPAM 1 MG PO TABS
0.0000 mg | ORAL_TABLET | Freq: Four times a day (QID) | ORAL | Status: DC
  Administered 2018-11-25 – 2018-11-26 (×4): 1 mg via ORAL
  Filled 2018-11-24 (×3): qty 1

## 2018-11-24 MED ORDER — LORAZEPAM 2 MG/ML IJ SOLN
0.5000 mg | Freq: Once | INTRAMUSCULAR | Status: AC
  Administered 2018-11-24: 14:00:00 0.5 mg via INTRAVENOUS
  Filled 2018-11-24: qty 1

## 2018-11-24 MED ORDER — METOPROLOL SUCCINATE ER 50 MG PO TB24
100.0000 mg | ORAL_TABLET | Freq: Every day | ORAL | Status: DC
  Administered 2018-11-25 – 2018-11-26 (×2): 100 mg via ORAL
  Filled 2018-11-24 (×2): qty 2

## 2018-11-24 MED ORDER — ONDANSETRON HCL 4 MG/2ML IJ SOLN: 4.0000 mg | Freq: Four times a day (QID) | INTRAMUSCULAR | Status: DC | PRN

## 2018-11-24 MED ORDER — LORAZEPAM 2 MG/ML IJ SOLN: 0.0000 mg | Freq: Two times a day (BID) | INTRAMUSCULAR | Status: DC

## 2018-11-24 MED ORDER — NICOTINE 14 MG/24HR TD PT24
14.0000 mg | MEDICATED_PATCH | Freq: Every day | TRANSDERMAL | Status: DC
  Administered 2018-11-25 – 2018-11-26 (×2): 14 mg via TRANSDERMAL
  Filled 2018-11-24 (×2): qty 1

## 2018-11-24 MED ORDER — ACETAMINOPHEN 650 MG RE SUPP: 650.0000 mg | Freq: Four times a day (QID) | RECTAL | Status: DC | PRN

## 2018-11-24 MED ORDER — HYDROMORPHONE HCL 1 MG/ML IJ SOLN
0.5000 mg | Freq: Once | INTRAMUSCULAR | Status: AC
  Administered 2018-11-24: 0.5 mg via INTRAVENOUS
  Filled 2018-11-24: qty 1

## 2018-11-24 MED ORDER — LORAZEPAM 2 MG/ML IJ SOLN: 0.0000 mg | Freq: Four times a day (QID) | INTRAMUSCULAR | Status: DC

## 2018-11-24 MED ORDER — THIAMINE HCL 100 MG/ML IJ SOLN: 100.0000 mg | Freq: Every day | INTRAMUSCULAR | Status: DC

## 2018-11-24 MED ORDER — BACITRACIN ZINC 500 UNIT/GM EX OINT
1.0000 "application " | TOPICAL_OINTMENT | Freq: Two times a day (BID) | CUTANEOUS | Status: DC
  Filled 2018-11-24: qty 28.35

## 2018-11-24 MED ORDER — LORAZEPAM 1 MG PO TABS
1.0000 mg | ORAL_TABLET | ORAL | Status: DC | PRN
  Administered 2018-11-24: 2 mg via ORAL
  Administered 2018-11-25 – 2018-11-26 (×2): 1 mg via ORAL
  Filled 2018-11-24 (×2): qty 1
  Filled 2018-11-24: qty 2

## 2018-11-24 MED ORDER — METHYLPHENIDATE HCL 5 MG PO TABS
10.0000 mg | ORAL_TABLET | Freq: Two times a day (BID) | ORAL | Status: DC
  Administered 2018-11-25 – 2018-11-26 (×3): 10 mg via ORAL
  Filled 2018-11-24 (×3): qty 2

## 2018-11-24 MED ORDER — PANTOPRAZOLE SODIUM 40 MG PO TBEC
40.0000 mg | DELAYED_RELEASE_TABLET | Freq: Two times a day (BID) | ORAL | Status: DC
  Administered 2018-11-24 – 2018-11-26 (×4): 40 mg via ORAL
  Filled 2018-11-24 (×4): qty 1

## 2018-11-24 MED ORDER — ACETAMINOPHEN 325 MG PO TABS: 650.0000 mg | ORAL_TABLET | Freq: Four times a day (QID) | ORAL | Status: DC | PRN

## 2018-11-24 MED ORDER — HYDROMORPHONE HCL 1 MG/ML IJ SOLN
0.5000 mg | Freq: Once | INTRAMUSCULAR | Status: AC
  Administered 2018-11-24: 16:00:00 0.5 mg via INTRAVENOUS
  Filled 2018-11-24: qty 1

## 2018-11-24 MED ORDER — SODIUM CHLORIDE 0.9% FLUSH: 3.0000 mL | Freq: Two times a day (BID) | INTRAVENOUS | Status: DC

## 2018-11-24 MED ORDER — FOLIC ACID 1 MG PO TABS
1.0000 mg | ORAL_TABLET | Freq: Every day | ORAL | Status: DC
  Administered 2018-11-24 – 2018-11-26 (×3): 1 mg via ORAL
  Filled 2018-11-24 (×3): qty 1

## 2018-11-24 MED ORDER — LORAZEPAM 1 MG PO TABS
0.0000 mg | ORAL_TABLET | Freq: Two times a day (BID) | ORAL | Status: DC
  Filled 2018-11-24: qty 1

## 2018-11-24 MED ORDER — DESVENLAFAXINE SUCCINATE ER 50 MG PO TB24
50.0000 mg | ORAL_TABLET | Freq: Every day | ORAL | Status: DC
  Administered 2018-11-25 – 2018-11-26 (×2): 50 mg via ORAL
  Filled 2018-11-24 (×2): qty 1

## 2018-11-24 MED ORDER — OXYBUTYNIN CHLORIDE ER 5 MG PO TB24
10.0000 mg | ORAL_TABLET | Freq: Every day | ORAL | Status: DC
  Administered 2018-11-25 – 2018-11-26 (×2): 10 mg via ORAL
  Filled 2018-11-24 (×2): qty 2

## 2018-11-24 NOTE — ED Notes (Signed)
[facility] arrived to transport pt to [facility] 

## 2018-11-24 NOTE — ED Triage Notes (Signed)
Fall while walking dog, right hip pain.

## 2018-11-24 NOTE — ED Notes (Signed)
ED TO INPATIENT HANDOFF REPORT  ED Nurse Name and Phone #: Shelda Pal [place]ody [place] Name/Age/Gender Mindy Young 73 y.o. female Room/Bed: MH09/MH09  Code Status   Code Status: Prior  Home/SNF/Other Home Patient oriented to: self, place, time and situation Is this baseline? Yes   Triage Complete: Triage complete  Chief Complaint fall  Triage Note Fall while walking dog, right hip pain.   Allergies Allergies  Allergen Reactions  . Percocet [Oxycodone-Acetaminophen] Other (See Comments)    "bugs crawling on me"    Level of Care/Admitting Diagnosis ED Disposition    ED Disposition Condition Comment   Admit  Hospital Area: [facility] [[id number]]  Level of Care: Telemetry [5]  Admit to tele based on following criteria: Other see comments  Comments: h/o alcohol use  Covid Evaluation: Confirmed COVID Negative  Diagnosis: Pubic bone fracture ([place]) [id number]  Admitting Physician: Samuella Cota [[id number]]  Attending Physician: Samuella Cota [[id number]]  PT Class (Do Not Modify): Observation [104]  PT Acc Code (Do Not Modify): Observation [10022]       B Medical/Surgery History Past Medical History:  Diagnosis Date  . Allergy   . Anemia 06/29/2011  . Anxiety   . Arthritis   . Breast cancer ([place])    right breast  . C1 cervical fracture ([place]) 02/1997  . Cancer ([place])    melanoma  . Depression   . Family history of breast cancer   . Family history of prostate cancer   . GERD (gastroesophageal reflux disease)   . Hypertension    Past Surgical History:  Procedure Laterality Date  . ABDOMINAL HYSTERECTOMY    . ASPIRATION OF ABSCESS Right 11/20/2017   Procedure: ASPIRATION OF RIGHT AXILLARY SEROMA;  Surgeon: Rolm Bookbinder, MD;  Location: [facility];  Service: General;  Laterality: Right;  . AUGMENTATION MAMMAPLASTY Bilateral    biateral implants , approx 2015  . BREAST LUMPECTOMY Right 11/02/2017   re-ex 11-20-17  . BREAST  LUMPECTOMY WITH RADIOACTIVE SEED AND SENTINEL LYMPH NODE BIOPSY Right 11/02/2017   Procedure: BREAST LUMPECTOMY WITH RADIOACTIVE SEED AND SENTINEL LYMPH NODE BIOPSY;  Surgeon: Rolm Bookbinder, MD;  Location: [facility];  Service: General;  Laterality: Right;  . CHOLECYSTECTOMY    . collarbone    . INCONTINENCE SURGERY    . KNEE SURGERY     removal of cyst, repair of cartiledge  . LAPAROSCOPY     for endometriosis  . RE-EXCISION OF BREAST LUMPECTOMY Right 11/20/2017   Procedure: RE-EXCISION OF RIGHT BREAST MARGINS;  Surgeon: Rolm Bookbinder, MD;  Location: [facility];  Service: General;  Laterality: Right;  . ROTATOR CUFF REPAIR     left     A IV Location/Drains/Wounds Patient Lines/Drains/Airways Status   Active Line/Drains/Airways    Name:   Placement date:   Placement time:   Site:   Days:   Peripheral IV 11/24/18 Left Antecubital   11/24/18    1205    Antecubital   less than 1   External Urinary Catheter   11/24/18    1352    -   less than 1   Incision (Closed) 11/02/17 Breast Right   11/02/17    1204     387   Incision (Closed) 11/02/17 Axilla Right   11/02/17    1204     387   Incision (Closed) 11/20/17 Breast Right   11/20/17    1018     369  Wound 06/28/11 Other (Comment) Other (Comment) Right Bruise   06/28/11    2000    Other (Comment)   2706          Intake/Output Last 24 hours  Intake/Output Summary (Last 24 hours) at 11/24/2018 1534 Last data filed at 11/24/2018 1522 Gross per 24 hour  Intake 1000 ml  Output -  Net 1000 ml    Labs/Imaging Results for orders placed or performed during the hospital encounter of 11/24/18 (from the past 48 hour(s))  Basic metabolic panel     Status: Abnormal   Collection Time: 11/24/18 11:56 AM  Result Value Ref Range   Sodium 126 (L) 135 - 145 mmol/L   Potassium 4.3 3.5 - 5.1 mmol/L   Chloride 89 (L) 98 - 111 mmol/L   CO2 21 (L) 22 - 32 mmol/L   Glucose, Bld 115 (H) 70 - 99 mg/dL   BUN 10 8  - 23 mg/dL   Creatinine, Ser 0.56 0.44 - 1.00 mg/dL   Calcium 9.4 8.9 - 10.3 mg/dL   GFR calc non Af Amer >60 >60 mL/min   GFR calc Af Amer >60 >60 mL/min   Anion gap 16 (H) 5 - 15    Comment: Performed at [facility], [address] [place], [place] [address]  CBC with Differential     Status: Abnormal   Collection Time: 11/24/18 11:56 AM  Result Value Ref Range   WBC 11.0 (H) 4.0 - 10.5 K/uL   RBC 4.23 3.87 - 5.11 MIL/uL   Hemoglobin 14.2 12.0 - 15.0 g/dL   HCT 40.0 36.0 - 46.0 %   MCV 94.6 80.0 - 100.0 fL   MCH 33.6 26.0 - 34.0 pg   MCHC 35.5 30.0 - 36.0 g/dL   RDW 11.8 11.5 - 15.5 %   Platelets 372 150 - 400 K/uL   nRBC 0.0 0.0 - 0.2 %   Neutrophils Relative % 76 %   Neutro Abs 8.5 (H) 1.7 - 7.7 K/uL   Lymphocytes Relative 11 %   Lymphs Abs 1.2 0.7 - 4.0 K/uL   Monocytes Relative 11 %   Monocytes Absolute 1.2 (H) 0.1 - 1.0 K/uL   Eosinophils Relative 0 %   Eosinophils Absolute 0.0 0.0 - 0.5 K/uL   Basophils Relative 1 %   Basophils Absolute 0.1 0.0 - 0.1 K/uL   Immature Granulocytes 1 %   Abs Immature Granulocytes 0.07 0.00 - 0.07 K/uL    Comment: Performed at [facility], [address] [place], [place] [address]  SARS Coronavirus 2 [facility] order, Performed in [facility] hospital lab) Nasopharyngeal Nasopharyngeal Swab     Status: None   Collection Time: 11/24/18 11:56 AM   Specimen: Nasopharyngeal Swab  Result Value Ref Range   SARS Coronavirus 2 NEGATIVE NEGATIVE    Comment: (NOTE) If result is NEGATIVE SARS-CoV-2 target nucleic acids are NOT DETECTED. The SARS-CoV-2 RNA is generally detectable in upper and lower  respiratory specimens during the acute phase of infection. The lowest  concentration of SARS-CoV-2 viral copies this assay can detect is 250  copies / mL. A negative result does not preclude SARS-CoV-2 infection  and should not be used as the sole basis for treatment or other  patient management decisions.  A negative result  may occur with  improper specimen collection / handling, submission of specimen other  than nasopharyngeal swab, presence of viral mutation(s) within the  areas targeted by this assay, and  inadequate number of viral copies  (<250 copies / mL). A negative result must be combined with clinical  observations, patient history, and epidemiological information. If result is POSITIVE SARS-CoV-2 target nucleic acids are DETECTED. The SARS-CoV-2 RNA is generally detectable in upper and lower  respiratory specimens dur ing the acute phase of infection.  Positive  results are indicative of active infection with SARS-CoV-2.  Clinical  correlation with patient history and other diagnostic information is  necessary to determine patient infection status.  Positive results do  not rule out bacterial infection or co-infection with other viruses. If result is PRESUMPTIVE POSTIVE SARS-CoV-2 nucleic acids MAY BE PRESENT.   A presumptive positive result was obtained on the submitted specimen  and confirmed on repeat testing.  While 2019 novel coronavirus  (SARS-CoV-2) nucleic acids may be present in the submitted sample  additional confirmatory testing may be necessary for epidemiological  and / or clinical management purposes  to differentiate between  SARS-CoV-2 and other Sarbecovirus currently known to infect humans.  If clinically indicated additional testing with an alternate test  methodology [phone]) is advised. The SARS-CoV-2 RNA is generally  detectable in upper and lower respiratory sp ecimens during the acute  phase of infection. The expected result is Negative. Fact Sheet for Patients:  StrictlyIdeas.no Fact Sheet for Healthcare Providers: BankingDealers.co.za This test is not yet approved or cleared by the [place] FDA and has been authorized for detection and/or diagnosis of SARS-CoV-2 by FDA under an Emergency Use Authorization (EUA).   This EUA will remain in effect (meaning this test can be used) for the duration of the COVID-19 declaration under Section 564(b)(1) of the Act, 21 U.S.C. section 360bbb-3(b)(1), unless the authorization is terminated or revoked sooner. Performed at [facility], [address] [place], [place] [address]    Dg Hip Unilat With Pelvis 2-3 Views Right  Result Date: 11/24/2018 CLINICAL DATA:  Right hip pain status post fall EXAM: DG HIP (WITH OR WITHOUT PELVIS) 2-3V RIGHT COMPARISON:  None. FINDINGS: There is an acute fracture of the parasymphyseal pubic bone on the right. There is no acute displaced fracture or dislocation of the proximal right femur. There is gaseous distention of multiple loops of small bowel scattered throughout the lower abdomen. IMPRESSION: 1. Acute fracture of the parasymphyseal pubic bone on the right. 2. No acute displaced fracture or dislocation of the proximal right femur. 3. Gaseous distention of multiple small bowel loops. Electronically Signed   By: Constance Holster M.D.   On: 11/24/2018 12:43    Pending Labs Unresulted Labs (From admission, onward)   None      Vitals/Pain Today's Vitals   11/24/18 1245 11/24/18 1312 11/24/18 1345 11/24/18 1352  BP: 107/71  123/82 123/82  Pulse: 84  83 83  Resp: 14  19   Temp:      TempSrc:      SpO2: 100%  100%   PainSc:  6       Isolation Precautions No active isolations  Medications Medications  bacitracin ointment 1 application (has no administration in time range)  LORazepam (ATIVAN) injection 0-4 mg (has no administration in time range)    Or  LORazepam (ATIVAN) tablet 0-4 mg (has no administration in time range)  LORazepam (ATIVAN) injection 0-4 mg (has no administration in time range)    Or  LORazepam (ATIVAN) tablet 0-4 mg (has no administration in time range)  thiamine (VITAMIN B-1) tablet 100 mg (100 mg Oral Given 11/24/18  1421)    Or  thiamine (B-1) injection 100 mg ( Intravenous See  Alternative 11/24/18 1421)  morphine 4 MG/ML injection 4 mg (4 mg Intravenous Given 11/24/18 1207)  HYDROmorphone (DILAUDID) injection 0.5 mg (0.5 mg Intravenous Given 11/24/18 1241)  LORazepam (ATIVAN) injection 0.5 mg (0.5 mg Intravenous Given 11/24/18 1413)  sodium chloride 0.9 % bolus 1,000 mL (0 mLs Intravenous Stopped 11/24/18 1522)  HYDROmorphone (DILAUDID) injection 0.5 mg (0.5 mg Intravenous Given 11/24/18 1531)    Mobility non-ambulatory Low fall risk   Focused Assessments    R Recommendations: See Admitting Provider Note  Report given to:   Additional Notes: R arm restriction, purewick, CIWA

## 2018-11-24 NOTE — H&P (Signed)
History and Physical  Mindy Young [id number] DOB: [birth year] DOA: 11/24/2018  PCP: Lennie Odor, PA-C   Chief Complaint: Right hip pain, unable to bear weight  HPI:  73 year old woman PMH breast cancer, depression, presented to the emergency department after a fall walking her dog resulting in her right hip pain and inability to ambulate.  Found to have acute pubic bone fracture, was unable to ambulate, referred for observation, pain control and therapy evaluations.  Patient was walking her dog today when the dog pulled her off balance and she fell onto her right knee and right elbow.  When she tried to stand she was unable to bear weight on her right leg secondary to excruciating pain in her right groin and buttock.  No specific aggravating or alleviating factors noted.  No associated symptoms.  Patient has struggling a lot with health issues and depression, she is going through a divorce.  She does endorse a couple drinks of liquor per day.  Chart review: . 03/2018 outpatient neurology office visit for tardive dyskinesia secondary to [place].  There is a notation of daily alcohol use and alcohol abuse. [facility] Hospitalization 2013 severe hyponatremia, also alcohol abuse was noted.  ED Course: Treated with Dilaudid  Review of Systems:  Negative for fever, visual changes, sore throat, rash, , chest pain, SOB, dysuria, bleeding, n/v/abdominal pain.  Past Medical History:  Diagnosis Date  . Allergy   . Anemia 06/29/2011  . Anxiety   . Arthritis   . Breast cancer ([place])    right breast  . C1 cervical fracture ([place]) 02/1997  . Cancer ([place])    melanoma  . Depression   . Family history of breast cancer   . Family history of prostate cancer   . GERD (gastroesophageal reflux disease)   . Hypertension   . Tardive dyskinesia     Past Surgical History:  Procedure Laterality Date  . ABDOMINAL HYSTERECTOMY    . ASPIRATION OF ABSCESS Right 11/20/2017   Procedure: ASPIRATION OF RIGHT  AXILLARY SEROMA;  Surgeon: Rolm Bookbinder, MD;  Location: [facility];  Service: General;  Laterality: Right;  . AUGMENTATION MAMMAPLASTY Bilateral    biateral implants , approx 2015  . BREAST LUMPECTOMY Right 11/02/2017   re-ex 11-20-17  . BREAST LUMPECTOMY WITH RADIOACTIVE SEED AND SENTINEL LYMPH NODE BIOPSY Right 11/02/2017   Procedure: BREAST LUMPECTOMY WITH RADIOACTIVE SEED AND SENTINEL LYMPH NODE BIOPSY;  Surgeon: Rolm Bookbinder, MD;  Location: [place];  Service: General;  Laterality: Right;  . CHOLECYSTECTOMY    . collarbone    . INCONTINENCE SURGERY    . KNEE SURGERY     removal of cyst, repair of cartiledge  . LAPAROSCOPY     for endometriosis  . RE-EXCISION OF BREAST LUMPECTOMY Right 11/20/2017   Procedure: RE-EXCISION OF RIGHT BREAST MARGINS;  Surgeon: Rolm Bookbinder, MD;  Location: [place];  Service: General;  Laterality: Right;  . ROTATOR CUFF REPAIR     left     reports that she has been smoking cigarettes. She has been smoking about 1.00 pack per day. She has never used smokeless tobacco. She reports current alcohol use of about 21.0 standard drinks of alcohol per week. She reports that she does not use drugs. Mobility: Ambulatory  Allergies  Allergen Reactions  . Percocet [Oxycodone-Acetaminophen] Other (See Comments)    "bugs crawling on me"    Family History  Problem Relation Age of Onset  . COPD Mother   .  Prostate cancer Father 41       seed implant for treatment  . Colon polyps Father        'a few'  . Breast cancer Sister 9  . Breast cancer Maternal Aunt        dx >50  . Breast cancer Other 35       bilateral  . Colon cancer Neg Hx      Prior to Admission medications   Medication Sig Start Date End Date Taking? Authorizing Provider  desvenlafaxine (PRISTIQ) 100 MG 24 hr tablet Take 100 mg by mouth daily. Pt unsure of dosage   Yes [provider]  methylphenidate (RITALIN) 10 MG  tablet Take 10 mg by mouth 2 (two) times daily.   Yes [provider]  ALPRAZolam Duanne Moron) 0.5 MG tablet Take 0.5 mg by mouth 2 (two) times daily as needed. For anxiety 08/10/10   [provider]  amLODipine (NORVASC) 5 MG tablet Take 5 mg by mouth daily.    [provider]  cetirizine (ZYRTEC) 10 MG tablet Take 10 mg by mouth as needed for allergies.    [provider]  cloNIDine (CATAPRES) 0.1 MG tablet Take 1 tablet by mouth 2 (two) times daily. 11/07/16   [provider]  doxycycline (VIBRAMYCIN) 100 MG capsule Take 100 mg by mouth 2 (two) times daily. 04/16/18   [provider]  letrozole (FEMARA) 2.5 MG tablet TAKE 1 TABLET(2.5 MG) BY MOUTH DAILY 10/12/18   Nicholas Lose, MD  metoprolol succinate (TOPROL-XL) 100 MG 24 hr tablet Take 1 tablet by mouth daily. 02/19/18   [provider]  omeprazole (PRILOSEC) 40 MG capsule TAKE 1 CAPSULE BY MOUTH TWICE DAILY 30 MINUTES BEFORE BREAKFAST AND 30 MINUTES BEFORE DINNER 10/12/18   Nandigam, Venia Minks, MD  oxybutynin (DITROPAN-XL) 10 MG 24 hr tablet Take 1 tablet by mouth daily. 12/05/16   [provider]    Physical Exam: Vitals:   11/24/18 1430 11/24/18 1515  BP: 101/78 131/84  Pulse: 87 79  Resp: 19 (!) 24  Temp:    SpO2: 100% 99%    Constitutional:   . Appears calm and comfortable Eyes:  . pupils and irises appear normal . Normal lids  ENMT:  . grossly normal hearing  . Lips appear normal Neck:  . neck appears normal, no masses . no thyromegaly Respiratory:  . CTA bilaterally, no w/r/r.  . Respiratory effort normal.  Cardiovascular:  . RRR, no m/r/g . No LE extremity edema   Abdomen:  . Soft, nontender, nondistended.  No hernias noted. Musculoskeletal:  . Digits/nails BUE: no clubbing, cyanosis, petechiae, infection . RLE, LLE   . strength and tone grossly normal, able to lift both legs off the bed. Neurologic:  . Sensation right foot grossly intact  Psychiatric:  . Mental status o Mood, affect appropriate . judgment and insight difficult to gauge   I have personally reviewed following labs and imaging studies  Labs:  Sodium 126, remainder BMP unremarkable WBC 11.0, remainder CBC unremarkable SARS-CoV-2 negative  Imaging studies:   Right hip film showed acute fracture parasymphyseal pubic bone on the right.  No fracture dislocation proximal right femur.  Medical tests:   EKG independently reviewed: Sinus rhythm, no acute changes  Principal Problem:   Pubic bone fracture ([facility]) Active Problems:   Hyponatremia   Alcohol abuse   Assessment/Plan Acute fracture parasymphyseal pubic bone, right secondary to mechanical fall with severe pain and inability to bear weight. --Nonoperative  treatment.  Weight-bear as tolerated. --Pain control --Physical therapy evaluation tomorrow  Mild hyponatremia --Appears asymptomatic.  Could be secondary to Pristiq, however given low chloride and suspected significant alcohol use, may be secondary to poor solute intake. --IV fluids.  Check BMP in a.m.  Suspected alcohol abuse --CIWA --CSW consult  Depression, anxiety --Continue Pristiq, Ritalin; Xanax as needed  Essential hypertension --Continue clonidine, metoprolol, amlodipine  Cigarette smoker --Nicotine patch  Severity of Illness: The appropriate patient status for this patient is OBSERVATION. Observation status is judged to be reasonable and necessary in order to provide the required intensity of service to ensure the patient's safety. The patient's presenting symptoms, physical exam findings, and initial radiographic and laboratory data in the context of their medical condition is felt to place them at decreased risk for further clinical deterioration. Furthermore, it is anticipated that the patient will be medically stable for discharge from the hospital within 2 midnights of admission. The following factors support the patient  status of observation.   " The patient's presenting symptoms include pain right groin after fall. " The physical exam findings include marked pain with manipulation of right leg. " The initial radiographic and laboratory data are notable for right pubic bone fracture, mild hyponatremia.  DVT prophylaxis: enoxaparin Code Status: Full Family Communication: none Consults called: none    Time spent: 60 minutes  Murray Hodgkins, MD  [facility] Direct contact: see [email or web address]  7PM-7AM contact night coverage as below   1. Check the care team in [facility] and look for a) attending/consulting [facility] provider listed and b) the [facility] team listed 2. Log into [email or web address] and use [place]'s universal password to access. If you do not have the password, please contact the hospital operator. 3. Locate the [facility] provider you are looking for under [facility] and page to a number that you can be directly reached. 4. If you still have difficulty reaching the provider, please page the [facility] (Director on Call) for the Hospitalists listed on amion for assistance.   11/24/2018, 5:28 PM

## 2018-11-24 NOTE — ED Provider Notes (Signed)
[facility] EMERGENCY DEPARTMENT Provider Note   CSN: [id number] Arrival date & time: 11/24/18  1113     History   Chief Complaint Chief Complaint  Patient presents with  . Fall    HPI SHECCID Mindy Young is a 73 y.o. female.     73 year old female with past medical history including breast cancer, melanoma, GERD, hypertension who presents with right hip injury.  Just prior to arrival, the patient was walking her dog when her dog jerked forward and pulled her over, causing her to land on her right elbow and hip.  Mindy tried to stand up but has not been able to bear weight on her right leg.  Mindy reports severe, constant right hip pain that starts in the front and radiates to her buttock.  Mindy sustained abrasions to her right knee and elbow but denies any significant pain in these areas.  No other injuries.  Mindy did not hit her head or Young consciousness.  No anticoagulant use.  Up-to-date on tetanus vaccination.  The history is provided by the patient.  Fall    Past Medical History:  Diagnosis Date  . Allergy   . Anemia 06/29/2011  . Anxiety   . Arthritis   . Breast cancer (Mindy Young)    right breast  . C1 cervical fracture (Mindy Young) 02/1997  . Cancer (Mindy Young)    melanoma  . Depression   . Family history of breast cancer   . Family history of prostate cancer   . GERD (gastroesophageal reflux disease)   . Hypertension     Patient Active Problem List   Diagnosis Date Noted  . Pubic bone fracture (Mindy Young) 11/24/2018  . Left knee pain 03/29/2018  . Unilateral primary osteoarthritis, left knee 03/29/2018  . Genetic testing 01/24/2018  . Family history of breast cancer   . Family history of prostate cancer   . Malignant neoplasm of lower-inner quadrant of right breast of female, estrogen receptor positive (Mindy Young) 10/24/2017  . Anemia 06/29/2011  . Hypertension   . Hyponatremia 06/25/2011  . Transaminitis 06/25/2011  . Alcohol abuse 06/25/2011  . Cough 06/25/2011  . Cigarette  smoker 06/25/2011    Past Surgical History:  Procedure Laterality Date  . ABDOMINAL HYSTERECTOMY    . ASPIRATION OF ABSCESS Right 11/20/2017   Procedure: ASPIRATION OF RIGHT AXILLARY SEROMA;  Surgeon: Mindy Bookbinder, MD;  Location: [place];  Service: General;  Laterality: Right;  . AUGMENTATION MAMMAPLASTY Bilateral    biateral implants , approx 2015  . BREAST LUMPECTOMY Right 11/02/2017   re-ex 11-20-17  . BREAST LUMPECTOMY WITH RADIOACTIVE SEED AND SENTINEL LYMPH NODE BIOPSY Right 11/02/2017   Procedure: BREAST LUMPECTOMY WITH RADIOACTIVE SEED AND SENTINEL LYMPH NODE BIOPSY;  Surgeon: Mindy Bookbinder, MD;  Location: [place];  Service: General;  Laterality: Right;  . CHOLECYSTECTOMY    . collarbone    . INCONTINENCE SURGERY    . KNEE SURGERY     removal of cyst, repair of cartiledge  . LAPAROSCOPY     for endometriosis  . RE-EXCISION OF BREAST LUMPECTOMY Right 11/20/2017   Procedure: RE-EXCISION OF RIGHT BREAST MARGINS;  Surgeon: Mindy Bookbinder, MD;  Location: [facility];  Service: General;  Laterality: Right;  . ROTATOR CUFF REPAIR     left     OB History   No obstetric history on file.      Home Medications    Prior to Admission medications   Medication Sig Start Date  End Date Taking? Authorizing Provider  ALPRAZolam Mindy Young) 0.5 MG tablet Take 0.5 mg by mouth 2 (two) times daily as needed. For anxiety 08/10/10   [provider]  amLODipine (NORVASC) 5 MG tablet Take 5 mg by mouth daily.    [provider]  cetirizine (ZYRTEC) 10 MG tablet Take 10 mg by mouth as needed for allergies.    [provider]  cloNIDine (CATAPRES) 0.1 MG tablet Take 1 tablet by mouth 2 (two) times daily. 11/07/16   [provider]  doxycycline (VIBRAMYCIN) 100 MG capsule Take 100 mg by mouth 2 (two) times daily. 04/16/18   [provider]  letrozole (FEMARA) 2.5 MG tablet TAKE 1 TABLET(2.5 MG) BY  MOUTH DAILY 10/12/18   Mindy Lose, MD  metoprolol succinate (TOPROL-XL) 100 MG 24 hr tablet Take 1 tablet by mouth daily. 02/19/18   [provider]  omeprazole (PRILOSEC) 40 MG capsule TAKE 1 CAPSULE BY MOUTH TWICE DAILY 30 MINUTES BEFORE BREAKFAST AND 30 MINUTES BEFORE DINNER 10/12/18   Mindy, Venia Minks, MD  oxybutynin (DITROPAN-XL) 10 MG 24 hr tablet Take 1 tablet by mouth daily. 12/05/16   [provider]    Family History Family History  Problem Relation Age of Onset  . COPD Mother   . Prostate cancer Father 22       seed implant for treatment  . Colon polyps Father        'a few'  . Breast cancer Sister 35  . Breast cancer Maternal Aunt        dx >50  . Breast cancer Other 35       bilateral  . Colon cancer Neg Hx     Social History Social History   Tobacco Use  . Smoking status: Current Every Day Smoker    Packs/day: 1.00    Types: Cigarettes  . Smokeless tobacco: Never Used  . Tobacco comment: e-cigs  Substance Use Topics  . Alcohol use: Yes    Alcohol/week: 21.0 standard drinks    Types: 21 Standard drinks or equivalent per week    Comment: 2 per weekend day; denies during the week (not consistent with prior hx)  . Drug use: No     Allergies   Percocet [oxycodone-acetaminophen]   Review of Systems Review of Systems All other systems reviewed and are negative except that which was mentioned in HPI   Physical Exam Updated Vital Signs BP 123/82   Pulse 83   Temp 98.7 F (37.1 C) (Oral)   Resp 19   SpO2 100%   Physical Exam Vitals signs and nursing note reviewed.  Constitutional:      General: Mindy is not in acute distress.    Appearance: Mindy Young.     Comments: In distress due to pain  HENT:     Head: Normocephalic and atraumatic.  Eyes:     Conjunctiva/sclera: Conjunctivae normal.  Neck:     Musculoskeletal: Neck supple. No muscular tenderness.  Cardiovascular:     Rate and Rhythm: Normal rate and regular  rhythm.     Pulses: Normal pulses.     Heart sounds: Normal heart sounds. No murmur.  Pulmonary:     Effort: Pulmonary effort is normal.     Breath sounds: Normal breath sounds.  Abdominal:     General: Bowel sounds are normal. There is no distension.     Palpations: Abdomen is soft.     Tenderness: There is no abdominal tenderness.  Musculoskeletal:  General: Tenderness present.     Comments: R leg internally rotated, tenderness of anterior hip, no obvious femur deformity; normal ROM R elbow  Skin:    General: Skin is warm and dry.     Comments: Abrasion R elbow, R knee  Neurological:     Mental Status: Mindy is alert and oriented to person, place, and time.     Sensory: No sensory deficit.     Comments: Fluent speech  Psychiatric:        Judgment: Judgment normal.     Comments: Distressed, anxious 2/2 pain      ED Treatments / Results  Labs (all labs ordered are listed, but only abnormal results are displayed) Labs Reviewed  BASIC METABOLIC PANEL - Abnormal; Notable for the following components:      Result Value   Sodium 126 (*)    Chloride 89 (*)    CO2 21 (*)    Glucose, Bld 115 (*)    Anion gap 16 (*)    All other components within normal limits  CBC WITH DIFFERENTIAL/PLATELET - Abnormal; Notable for the following components:   WBC 11.0 (*)    Neutro Abs 8.5 (*)    Monocytes Absolute 1.2 (*)    All other components within normal limits  SARS CORONAVIRUS 2 (HOSPITAL ORDER, [facility])    EKG EKG Interpretation  Date/Time:  Saturday November 24 2018 11:51:13 EDT Ventricular Rate:  77 PR Interval:    QRS Duration: 96 QT Interval:  401 QTC Calculation: 454 R Axis:   78 Text Interpretation:  Sinus rhythm No significant change since last tracing Confirmed by Theotis Burrow [phone]) on 11/24/2018 11:53:48 AM   Radiology Dg Hip Unilat With Pelvis 2-3 Views Right  Result Date: 11/24/2018 CLINICAL DATA:  Right hip pain status post  fall EXAM: DG HIP (WITH OR WITHOUT PELVIS) 2-3V RIGHT COMPARISON:  None. FINDINGS: There is an acute fracture of the parasymphyseal pubic bone on the right. There is no acute displaced fracture or dislocation of the proximal right femur. There is gaseous distention of multiple loops of small bowel scattered throughout the lower abdomen. IMPRESSION: 1. Acute fracture of the parasymphyseal pubic bone on the right. 2. No acute displaced fracture or dislocation of the proximal right femur. 3. Gaseous distention of multiple small bowel loops. Electronically Signed   By: Constance Holster M.D.   On: 11/24/2018 12:43    Procedures Procedures (including critical care time)  Medications Ordered in ED Medications  bacitracin ointment 1 application (has no administration in time range)  LORazepam (ATIVAN) injection 0-4 mg (has no administration in time range)    Or  LORazepam (ATIVAN) tablet 0-4 mg (has no administration in time range)  LORazepam (ATIVAN) injection 0-4 mg (has no administration in time range)    Or  LORazepam (ATIVAN) tablet 0-4 mg (has no administration in time range)  thiamine (VITAMIN B-1) tablet 100 mg (100 mg Oral Given 11/24/18 1421)    Or  thiamine (B-1) injection 100 mg ( Intravenous See Alternative 11/24/18 1421)  morphine 4 MG/ML injection 4 mg (4 mg Intravenous Given 11/24/18 1207)  HYDROmorphone (DILAUDID) injection 0.5 mg (0.5 mg Intravenous Given 11/24/18 1241)  LORazepam (ATIVAN) injection 0.5 mg (0.5 mg Intravenous Given 11/24/18 1413)     Initial Impression / Assessment and Plan / ED Course  I have reviewed the triage vital signs and the nursing notes.  Pertinent labs & imaging results that were available during my  care of the patient were reviewed by me and considered in my medical decision making (see chart for details).         VSS on arrival, normal distal pulses.  Patient anxious and in pain, required a few doses of IV narcotics to control pain.  Lab work  shows sodium 126, chloride 89, CO2 21, anion gap 16, glucose 115.  WBC 11.  Mindy does have previous lab work showing hyponatremia.  COVID-19 negative.  X-ray shows acute fracture of parasymphyseal pubic bone on the right.  I discussed this injury with orthopedics, Dr. Sharol Given, who advised no acute orthopedic interventions required and patient can be managed conservatively with pain medications, weightbearing as tolerated.  Mindy can follow-up in their clinic as an outpatient.  Mindy continues to have pain on exam and given the fact that Mindy is elderly, obese, and unable to ambulate here, I am concerned about her ability to function at home as well as to control pain in the acute fracture setting.  Discussed admission with [facility], Dr. Sarajane Jews, appreciate his assistance.  Patient has been tremulous here; I did see h/o alcohol use in previous chart documentation. Placed on CIWA protocol.   Final Clinical Impressions(s) / ED Diagnoses   Final diagnoses:  Closed fracture of right pubis, unspecified portion of pubis, initial encounter [facility])  Hyponatremia    ED Discharge Orders    None       Khristian Seals, Wenda Overland, MD 11/24/18 1427

## 2018-11-24 NOTE — ED Notes (Signed)
ED Provider at bedside. 

## 2018-11-24 NOTE — Care Plan (Signed)
No charge note  Transfer from [facility] Dr. Rex Kras  73 year old woman PMH including breast cancer, significant alcohol use, presented to the emergency department after falling while walking her dog, resulting in right leg pain and inability to ambulate.  Imaging revealed acute fracture parasymphyseal pubic bone on the right  Patient had severe pain with minimal movement  Afebrile, vital signs stable Blood work notable for sodium of 126, chloride of 89  Assessment/plan Place in observation for pain control for   acute pubic bone fracture and inability to ambulate.  PT/OT evaluations.    IV fluids for hyponatremia.  CIWA for history of significant alcohol use by report.  Murray Hodgkins, MD [facility] [phone]

## 2018-11-25 DIAGNOSIS — G2401 Drug induced subacute dyskinesia: Secondary | ICD-10-CM | POA: Diagnosis present

## 2018-11-25 DIAGNOSIS — Z853 Personal history of malignant neoplasm of breast: Secondary | ICD-10-CM | POA: Diagnosis not present

## 2018-11-25 DIAGNOSIS — F419 Anxiety disorder, unspecified: Secondary | ICD-10-CM | POA: Diagnosis present

## 2018-11-25 DIAGNOSIS — E871 Hypo-osmolality and hyponatremia: Secondary | ICD-10-CM | POA: Diagnosis present

## 2018-11-25 DIAGNOSIS — S329XXA Fracture of unspecified parts of lumbosacral spine and pelvis, initial encounter for closed fracture: Secondary | ICD-10-CM | POA: Diagnosis present

## 2018-11-25 DIAGNOSIS — S80211A Abrasion, right knee, initial encounter: Secondary | ICD-10-CM | POA: Diagnosis present

## 2018-11-25 DIAGNOSIS — F1010 Alcohol abuse, uncomplicated: Secondary | ICD-10-CM | POA: Diagnosis present

## 2018-11-25 DIAGNOSIS — Z170 Estrogen receptor positive status [ER+]: Secondary | ICD-10-CM | POA: Diagnosis not present

## 2018-11-25 DIAGNOSIS — W1839XA Other fall on same level, initial encounter: Secondary | ICD-10-CM | POA: Diagnosis present

## 2018-11-25 DIAGNOSIS — Y93K1 Activity, walking an animal: Secondary | ICD-10-CM | POA: Diagnosis not present

## 2018-11-25 DIAGNOSIS — Z803 Family history of malignant neoplasm of breast: Secondary | ICD-10-CM | POA: Diagnosis not present

## 2018-11-25 DIAGNOSIS — F17210 Nicotine dependence, cigarettes, uncomplicated: Secondary | ICD-10-CM | POA: Diagnosis present

## 2018-11-25 DIAGNOSIS — S32501A Unspecified fracture of right pubis, initial encounter for closed fracture: Secondary | ICD-10-CM | POA: Diagnosis not present

## 2018-11-25 DIAGNOSIS — Z85820 Personal history of malignant melanoma of skin: Secondary | ICD-10-CM | POA: Diagnosis not present

## 2018-11-25 DIAGNOSIS — M25551 Pain in right hip: Secondary | ICD-10-CM | POA: Diagnosis present

## 2018-11-25 DIAGNOSIS — S50311A Abrasion of right elbow, initial encounter: Secondary | ICD-10-CM | POA: Diagnosis present

## 2018-11-25 DIAGNOSIS — F329 Major depressive disorder, single episode, unspecified: Secondary | ICD-10-CM | POA: Diagnosis present

## 2018-11-25 DIAGNOSIS — Z79899 Other long term (current) drug therapy: Secondary | ICD-10-CM | POA: Diagnosis not present

## 2018-11-25 DIAGNOSIS — Z79811 Long term (current) use of aromatase inhibitors: Secondary | ICD-10-CM | POA: Diagnosis not present

## 2018-11-25 DIAGNOSIS — Z90710 Acquired absence of both cervix and uterus: Secondary | ICD-10-CM | POA: Diagnosis not present

## 2018-11-25 DIAGNOSIS — I10 Essential (primary) hypertension: Secondary | ICD-10-CM | POA: Diagnosis present

## 2018-11-25 DIAGNOSIS — Z20828 Contact with and (suspected) exposure to other viral communicable diseases: Secondary | ICD-10-CM | POA: Diagnosis present

## 2018-11-25 DIAGNOSIS — T43595A Adverse effect of other antipsychotics and neuroleptics, initial encounter: Secondary | ICD-10-CM | POA: Diagnosis present

## 2018-11-25 DIAGNOSIS — S32591A Other specified fracture of right pubis, initial encounter for closed fracture: Secondary | ICD-10-CM | POA: Diagnosis present

## 2018-11-25 LAB — CBC
HCT: 37.9 % (ref 36.0–46.0)
Hemoglobin: 12.9 g/dL (ref 12.0–15.0)
MCH: 33.9 pg (ref 26.0–34.0)
MCHC: 34 g/dL (ref 30.0–36.0)
MCV: 99.5 fL (ref 80.0–100.0)
Platelets: 303 10*3/uL (ref 150–400)
RBC: 3.81 MIL/uL — ABNORMAL LOW (ref 3.87–5.11)
RDW: 12.2 % (ref 11.5–15.5)
WBC: 6.9 10*3/uL (ref 4.0–10.5)
nRBC: 0 % (ref 0.0–0.2)

## 2018-11-25 LAB — COMPREHENSIVE METABOLIC PANEL
ALT: 40 U/L (ref 0–44)
AST: 40 U/L (ref 15–41)
Albumin: 3.5 g/dL (ref 3.5–5.0)
Alkaline Phosphatase: 72 U/L (ref 38–126)
Anion gap: 8 (ref 5–15)
BUN: 6 mg/dL — ABNORMAL LOW (ref 8–23)
CO2: 25 mmol/L (ref 22–32)
Calcium: 8.7 mg/dL — ABNORMAL LOW (ref 8.9–10.3)
Chloride: 102 mmol/L (ref 98–111)
Creatinine, Ser: 0.56 mg/dL (ref 0.44–1.00)
GFR calc Af Amer: >60 mL/min (ref >60)
GFR calc non Af Amer: >60 mL/min (ref >60)
Glucose, Bld: 105 mg/dL — ABNORMAL HIGH (ref 70–99)
Potassium: 3.8 mmol/L (ref 3.5–5.1)
Sodium: 135 mmol/L (ref 135–145)
Total Bilirubin: 0.5 mg/dL (ref 0.3–1.2)
Total Protein: 6.4 g/dL — ABNORMAL LOW (ref 6.5–8.1)

## 2018-11-25 LAB — PHOSPHORUS: Phosphorus: 4.3 mg/dL (ref 2.5–4.6)

## 2018-11-25 LAB — MAGNESIUM: Magnesium: 1.7 mg/dL (ref 1.7–2.4)

## 2018-11-25 MED ORDER — MAGNESIUM SULFATE IN D5W 1-5 GM/100ML-% IV SOLN
1.0000 g | Freq: Once | INTRAVENOUS | Status: AC
  Administered 2018-11-25: 12:00:00 1 g via INTRAVENOUS
  Filled 2018-11-25: qty 100

## 2018-11-25 MED ORDER — METHOCARBAMOL 500 MG PO TABS: 500.0000 mg | ORAL_TABLET | Freq: Three times a day (TID) | ORAL | Status: DC | PRN

## 2018-11-25 NOTE — Progress Notes (Signed)
[facility]  PROGRESS NOTE  Mindy Young [id number] DOB: October 12, 1945 DOA: 11/24/2018 PCP: Lennie Odor, PA-C   Brief HPI:   73 year old female with a history of breast cancer, depression came to ED after a fall walking her dog resulting in right hip pain and inability to ambulate.  Found to have acute pubic bone fracture.  Orthopedics was consulted and recommended consult and management with physical therapy and pain management.  No urgent surgical need.  Patient will follow up with Dr. Sharol Given as outpatient    Subjective   Patient seen and examined, continues to have pelvic pain.   Assessment/Plan:    1. Acute fracture parasymphyseal pubic bone-secondary to mechanical fall with severe pain and inability to bear weight.  Continue pain management with Dilaudid as needed, Vicodin as needed.  Will add Robaxin 500 mg p.o. every 8 hours as needed.  PT  has seen the patient, not ready for discharge.  PT  will reevaluate in a.m.  2. Hyponatremia-presented with sodium of 126, improved to 135 today after started on IV normal saline.   3. Suspected alcohol abuse-continue CIWA protocol.  4. Depression/anxiety-continue Pristiq, Ritalin, PRN Xanax.  5. Essential hypertension-continue clonidine, metoprolol, amlodipine.  6. Tobacco abuse-nicotine patch   CBC: Recent Labs  Lab 11/24/18 1156 11/25/18 0708  WBC 11.0* 6.9  NEUTROABS 8.5*  --   HGB 14.2 12.9  HCT 40.0 37.9  MCV 94.6 99.5  PLT 372 [id number]    Basic Metabolic Panel: Recent Labs  Lab 11/24/18 1156 11/25/18 0708  NA 126* 135  K 4.3 3.8  CL 89* 102  CO2 21* 25  GLUCOSE 115* 105*  BUN 10 6*  CREATININE 0.56 0.56  CALCIUM 9.4 8.7*  MG  --  1.7  PHOS  --  4.3     DVT prophylaxis: Lovenox  Code Status: Full code  Family Communication: No family at bedside  Disposition Plan: likely home when medically ready for discharge      BMI  Estimated body mass index is 28.51 kg/m as calculated from the  following:   Height as of this encounter: 5\' 2"  (1.575 m).   Weight as of this encounter: 70.7 kg.  Scheduled medications:  . amLODipine  5 mg Oral Daily  . bacitracin  1 application Topical BID  . cloNIDine  0.1 mg Oral BID  . desvenlafaxine  50 mg Oral Daily  . enoxaparin (LOVENOX) injection  40 mg Subcutaneous Q24H  . folic acid  1 mg Oral Daily  . letrozole  2.5 mg Oral Daily  . loratadine  10 mg Oral Daily  . LORazepam  0-4 mg Oral Q6H   Followed by  . [START ON 11/26/2018] LORazepam  0-4 mg Oral Q12H  . methylphenidate  10 mg Oral BID  . metoprolol succinate  100 mg Oral Daily  . multivitamin with minerals  1 tablet Oral Daily  . nicotine  14 mg Transdermal Daily  . oxybutynin  10 mg Oral Daily  . pantoprazole  40 mg Oral BID  . sodium chloride flush  3 mL Intravenous Q12H  . thiamine  100 mg Oral Daily   Or  . thiamine  100 mg Intravenous Daily    Consultants:  Procedures:     Antibiotics:   Anti-infectives (From admission, onward)   None       Objective   Vitals:   11/24/18 1515 11/24/18 1743 11/24/18 2029 11/25/18 0533  BP: 131/84  (!) 151/93 122/76  Pulse: 79  79 84  Resp: (!) 24  20 16   Temp:   97.6 F (36.4 C) 98 F (36.7 C)  TempSrc:   Oral Oral  SpO2: 99%  99% 96%  Weight:  70.7 kg    Height:  5\' 2"  (1.575 m)      Intake/Output Summary (Last 24 hours) at 11/25/2018 1442 Last data filed at 11/25/2018 1138 Gross per 24 hour  Intake 1685.55 ml  Output 2300 ml  Net -614.45 ml   Filed Weights   11/24/18 1743  Weight: 70.7 kg     Physical Examination:   General-appears in no acute distress Heart-S1-S2, regular, no murmur auscultated Lungs-clear to auscultation bilaterally, no wheezing or crackles auscultated Abdomen-soft, nontender, no organomegaly Extremities-no edema in the lower extremities Neuro-alert, oriented x3, no focal deficit noted    Data Reviewed: I have personally reviewed following labs and imaging  studies   Recent Results (from the past 240 hour(s))  Novel Coronavirus, NAA ([facility])     Status: None   Collection Time: 11/22/18 12:00 AM   Specimen: Oropharyngeal(OP) collection in vial transport medium   OROPHARYNGEA  TESTING  Result Value Ref Range Status   SARS-CoV-2, NAA Not Detected Not Detected Final    Comment: Testing was performed using the cobas(R) SARS-CoV-2 test. This nucleic acid amplification test was developed and its performance characteristics determined by [employer]. Nucleic acid amplification tests include PCR and TMA. This test has not been FDA cleared or approved. This test has been authorized by FDA under an Emergency Use Authorization (EUA). This test is only authorized for the duration of time the declaration that circumstances exist justifying the authorization of the emergency use of in vitro diagnostic tests for detection of SARS-CoV-2 virus and/or diagnosis of COVID-19 infection under section 564(b)(1) of the Act, 21 U.S.C. [id number]) (1), unless the authorization is terminated or revoked sooner. When diagnostic testing is negative, the possibility of a false negative result should be considered in the context of a patient's recent exposures and the presence of clinical signs and symptoms consistent with COVID-19. An individual without symptoms  of COVID-19 and who is not shedding SARS-CoV-2 virus would expect to have a negative (not detected) result in this assay.   SARS Coronavirus 2 [facility] order, Performed in [facility] hospital lab) Nasopharyngeal Nasopharyngeal Swab     Status: None   Collection Time: 11/24/18 11:56 AM   Specimen: Nasopharyngeal Swab  Result Value Ref Range Status   SARS Coronavirus 2 NEGATIVE NEGATIVE Final    Comment: (NOTE) If result is NEGATIVE SARS-CoV-2 target nucleic acids are NOT DETECTED. The SARS-CoV-2 RNA is generally detectable in upper and lower  respiratory specimens during the acute phase of  infection. The lowest  concentration of SARS-CoV-2 viral copies this assay can detect is 250  copies / mL. A negative result does not preclude SARS-CoV-2 infection  and should not be used as the sole basis for treatment or other  patient management decisions.  A negative result may occur with  improper specimen collection / handling, submission of specimen other  than nasopharyngeal swab, presence of viral mutation(s) within the  areas targeted by this assay, and inadequate number of viral copies  (<250 copies / mL). A negative result must be combined with clinical  observations, patient history, and epidemiological information. If result is POSITIVE SARS-CoV-2 target nucleic acids are DETECTED. The SARS-CoV-2 RNA is generally detectable in upper and lower  respiratory specimens dur ing the acute phase of infection.  Positive  results are indicative of active infection with SARS-CoV-2.  Clinical  correlation with patient history and other diagnostic information is  necessary to determine patient infection status.  Positive results do  not rule out bacterial infection or co-infection with other viruses. If result is PRESUMPTIVE POSTIVE SARS-CoV-2 nucleic acids MAY BE PRESENT.   A presumptive positive result was obtained on the submitted specimen  and confirmed on repeat testing.  While 2019 novel coronavirus  (SARS-CoV-2) nucleic acids may be present in the submitted sample  additional confirmatory testing may be necessary for epidemiological  and / or clinical management purposes  to differentiate between  SARS-CoV-2 and other Sarbecovirus currently known to infect humans.  If clinically indicated additional testing with an alternate test  methodology [phone]) is advised. The SARS-CoV-2 RNA is generally  detectable in upper and lower respiratory sp ecimens during the acute  phase of infection. The expected result is Negative. Fact Sheet for Patients:   StrictlyIdeas.no Fact Sheet for Healthcare Providers: BankingDealers.co.za This test is not yet approved or cleared by the [place] FDA and has been authorized for detection and/or diagnosis of SARS-CoV-2 by FDA under an Emergency Use Authorization (EUA).  This EUA will remain in effect (meaning this test can be used) for the duration of the COVID-19 declaration under Section 564(b)(1) of the Act, 21 U.S.C. section 360bbb-3(b)(1), unless the authorization is terminated or revoked sooner. Performed at [facility], [address] [place], [place] [address]      Liver Function Tests: Recent Labs  Lab 11/25/18 0708  AST 40  ALT 40  ALKPHOS 72  BILITOT 0.5  PROT 6.4*  ALBUMIN 3.5   No results for input(s): LIPASE, AMYLASE in the last 168 hours. No results for input(s): AMMONIA in the last 168 hours.  Cardiac Enzymes: No results for input(s): CKTOTAL, CKMB, CKMBINDEX, TROPONINI in the last 168 hours. BNP (last 3 results) No results for input(s): BNP in the last 8760 hours.  ProBNP (last 3 results) No results for input(s): PROBNP in the last 8760 hours.    Studies: Dg Hip Unilat With Pelvis 2-3 Views Right  Result Date: 11/24/2018 CLINICAL DATA:  Right hip pain status post fall EXAM: DG HIP (WITH OR WITHOUT PELVIS) 2-3V RIGHT COMPARISON:  None. FINDINGS: There is an acute fracture of the parasymphyseal pubic bone on the right. There is no acute displaced fracture or dislocation of the proximal right femur. There is gaseous distention of multiple loops of small bowel scattered throughout the lower abdomen. IMPRESSION: 1. Acute fracture of the parasymphyseal pubic bone on the right. 2. No acute displaced fracture or dislocation of the proximal right femur. 3. Gaseous distention of multiple small bowel loops. Electronically Signed   By: Constance Holster M.D.   On: 11/24/2018 12:43     Admission status: Inpatient:  Based on patients clinical presentation and evaluation of above clinical data, I have made determination that patient meets Inpatient criteria at this time.  Time spent: 20 min  [facility] Pager [phone]. If 7PM-7AM, please contact night-coverage at [email or web address], Office  [phone]  password [email or web address]  11/25/2018, 2:42 PM  LOS: 0 days

## 2018-11-25 NOTE — Evaluation (Signed)
Physical Therapy Evaluation Patient Details Name: Mindy Young MRN: [id number] DOB: 05-Jan-1946 Today's Date: 11/25/2018   History of Present Illness  73 year old woman PMH breast cancer, depression, presented to the ED after a fall walking her dog resulting in right hip pain and inability to ambulate. xray= acute parasymphyseal pubic bone fracture, was unable to ambulate, referred for observation, pain control and therapy evaluations.  on CIWA protocol.    Clinical Impression  Pt admitted with above diagnosis.  Pt is independent at her baseline.Pt able to amb short distance today with min assist and incr time. Pt reports pain is much more bearable today compared to yesterday. Not yet ready to d/c from PT standpoint. Will see again tomorrow for further family ed, gait and stair training (pt has 5 steps to enter her home)   Pt currently with functional limitations due to the deficits listed below (see PT Problem List). Pt will benefit from skilled PT to increase their independence and safety with mobility to allow discharge to the venue listed below.     Follow Up Recommendations Home health PT;Supervision for mobility/OOB    Equipment Recommendations  Rolling walker with 5" wheels;3in1 (PT)    Recommendations for Other Services       Precautions / Restrictions Precautions Precautions: Fall Restrictions Weight Bearing Restrictions: No Other Position/Activity Restrictions: WBAT (per Dr. Sharol Given)      Mobility  Bed Mobility Overal bed mobility: Needs Assistance Bed Mobility: Supine to Sit     Supine to sit: HOB elevated;Min assist;Min guard     General bed mobility comments: light assist to initiate movement RLE, incr time  Transfers Overall transfer level: Needs assistance Equipment used: Rolling walker (2 wheeled) Transfers: Sit to/from Stand Sit to Stand: Min assist         General transfer comment: cues for hand placement. assist for anterior-superior wt shift  transition to RW  Ambulation/Gait Ambulation/Gait assistance: Min assist Gait Distance (Feet): 12 Feet Assistive device: Rolling walker (2 wheeled) Gait Pattern/deviations: Step-to pattern;Decreased weight shift to right;Decreased stance time - right     General Gait Details: cues for RW position, sequence, use of UEs to assist with pain control  Stairs            Wheelchair Mobility    Modified Rankin (Stroke Patients Only)       Balance Overall balance assessment: Needs assistance;History of Falls         Standing balance support: During functional activity;Bilateral upper extremity supported Standing balance-Leahy Scale: Poor Standing balance comment: reliant on UEs                             Pertinent Vitals/Pain Pain Assessment: 0-10 Pain Score: 5  Pain Location: right groin Pain Descriptors / Indicators: Sore;Grimacing;Discomfort Pain Intervention(s): Limited activity within patient's tolerance;Monitored during session;Premedicated before session;Repositioned    Home Living Family/patient expects to be discharged to:: Private residence Living Arrangements: Alone Available Help at Discharge: Family Type of Home: House Home Access: Stairs to enter Entrance Stairs-Rails: Right;Left;Can reach both Technical brewer of Steps: 5 Home Layout: One level Home Equipment: None      Prior Function Level of Independence: Independent         Comments: dtr here from [place], staying to help pt as needed     Hand Dominance        Extremity/Trunk Assessment   Upper Extremity Assessment Upper Extremity Assessment: Defer to OT evaluation  Lower Extremity Assessment Lower Extremity Assessment: RLE deficits/detail;LLE deficits/detail RLE Deficits / Details: AAROM grossly WFL, strength at least 3 to 3+/5,limited by pain LLE Deficits / Details: AAROM grossly WFL, strength at least 3 to 3+/5,limited by pain       Communication    Communication: No difficulties  Cognition Arousal/Alertness: Awake/alert Behavior During Therapy: WFL for tasks assessed/performed Overall Cognitive Status: Within Functional Limits for tasks assessed                                        General Comments      Exercises General Exercises - Lower Extremity Ankle Circles/Pumps: AROM;Both;10 reps Quad Sets: 5 reps;Both;AROM   Assessment/Plan    PT Assessment Patient needs continued PT services  PT Problem List Decreased strength;Decreased range of motion;Decreased activity tolerance;Decreased mobility;Decreased balance;Pain;Decreased knowledge of use of DME       PT Treatment Interventions DME instruction;Gait training;Therapeutic exercise;Functional mobility training;Therapeutic activities;Stair training;Patient/family education    PT Goals (Current goals can be found in the Care Plan section)  Acute Rehab PT Goals Patient Stated Goal: to go home asap PT Goal Formulation: With patient Time For Goal Achievement: 12/02/18 Potential to Achieve Goals: Good    Frequency Min 4X/week   Barriers to discharge        Co-evaluation               AM-PAC PT "6 Clicks" Mobility  Outcome Measure Help needed turning from your back to your side while in a flat bed without using bedrails?: A Little Help needed moving from lying on your back to sitting on the side of a flat bed without using bedrails?: A Little Help needed moving to and from a bed to a chair (including a wheelchair)?: A Little Help needed standing up from a chair using your arms (e.g., wheelchair or bedside chair)?: A Little Help needed to walk in hospital room?: A Little Help needed climbing 3-5 steps with a railing? : A Lot 6 Click Score: 17    End of Session Equipment Utilized During Treatment: Gait belt Activity Tolerance: Patient tolerated treatment well Patient left: with call bell/phone within reach;in chair;with family/visitor  present Nurse Communication: Mobility status PT Visit Diagnosis: Difficulty in walking, not elsewhere classified (R26.2)    Time: 1314-1350 PT Time Calculation (min) (ACUTE ONLY): 36 min   Charges:   PT Evaluation $PT Eval Low Complexity: 1 Low PT Treatments $Gait Training: 8-22 mins        Kenyon Ana, PT  Pager: [phone] Acute Rehab Dept [facility]): [phone]   11/25/2018   [facility] 11/25/2018, 2:09 PM

## 2018-11-26 LAB — ETHANOL: Alcohol, Ethyl (B): <10 mg/dL (ref <10)

## 2018-11-26 MED ORDER — POLYETHYLENE GLYCOL 3350 17 G PO PACK
17.0000 g | PACK | Freq: Every day | ORAL | Status: DC
  Administered 2018-11-26: 10:00:00 17 g via ORAL
  Filled 2018-11-26: qty 1

## 2018-11-26 MED ORDER — HYDROCODONE-ACETAMINOPHEN 5-325 MG PO TABS: 1.0000 | ORAL_TABLET | Freq: Four times a day (QID) | ORAL | 0 refills | Status: DC | PRN

## 2018-11-26 MED ORDER — POLYETHYLENE GLYCOL 3350 17 G PO PACK: 17.0000 g | PACK | Freq: Every day | ORAL | 0 refills | Status: DC | PRN

## 2018-11-26 MED ORDER — BISACODYL 10 MG RE SUPP: 10.0000 mg | Freq: Every day | RECTAL | Status: DC | PRN

## 2018-11-26 NOTE — Progress Notes (Signed)
Found one black oil vapor at the window seal, patient stated that it belongs to her daughter. Place in bag with patient name, held at nursing station with the wines.

## 2018-11-26 NOTE — Discharge Summary (Addendum)
Physician Discharge Summary  CAMERA HAKER [id number] DOB: 26-Dec-1945 DOA: 11/24/2018  PCP: Lennie Odor, PA-C  Admit date: 11/24/2018 Discharge date: 11/26/2018  Time spent: 40 minutes  Recommendations for Outpatient Follow-up:  1. Follow-up Dr. Sharol Given in 2 weeks 2. Follow-up PCP in 2 weeks 3. Patient to go home with home health PT, 3 in 1 bedside commode, rolling walker   Discharge Diagnoses:  Principal Problem:   Pubic bone fracture ([place]) Active Problems:   Hyponatremia   Alcohol abuse   Pelvic fracture ([place])   Discharge Condition: Stable  Diet recommendation: Regular diet  Filed Weights   11/24/18 1743  Weight: 70.7 kg    History of present illness:  73 year old female with a history of breast cancer, depression came to ED after a fall walking her dog resulting in right hip pain and inability to ambulate.  Found to have acute pubic bone fracture.  Orthopedics was consulted and recommended consult and management with physical therapy and pain management.  No urgent surgical need.  Patient will follow up with Dr. Sharol Given as outpatient.  Will discharge on Vicodin,1 to 2 tablets every 6 hours as needed.   Hospital Course:   1. Acute fracture parasymphyseal pubic bone-secondary to mechanical fall with severe pain and inability to bear weight.    This morning patient worked with physical therapy, and PT recommends patient can be discharged with home health PT.  Also order rolling walker, 3 in 1 commode.  Follow-up with Dr. Sharol Given in 2 weeks.  2. Hyponatremia-presented with sodium of 126, improved to 135 with normal saline.  3. Suspected alcohol abuse-patient was started on CIWA protocol.  Did not go into severe alcohol withdrawal.  Stable for discharge.  4. Depression/anxiety-continue Pristiq, Ritalin, PRN Xanax.  5. Essential hypertension-continue clonidine, metoprolol, amlodipine.    Procedures:  None  Consultations: None  Discharge Exam: Vitals:    11/26/18 0103 11/26/18 0606  BP: (!) 156/96 (!) 137/95  Pulse: (!) 101 88  Resp: 16 16  Temp: 98.2 F (36.8 C) 98 F (36.7 C)  SpO2: 91% 92%    General: Appears in no acute distress Cardiovascular: S1-S2, regular Respiratory: Clear to auscultation bilaterally  Discharge Instructions   Discharge Instructions    Diet - low sodium heart healthy   Complete by: As directed    Increase activity slowly   Complete by: As directed      Allergies as of 11/26/2018      Reactions   Percocet [oxycodone-acetaminophen] Other (See Comments)   "bugs crawling on me"      Medication List    TAKE these medications   ALPRAZolam 0.5 MG tablet Commonly known as: XANAX Take 0.5 mg by mouth 2 (two) times daily as needed. For anxiety   amLODipine 5 MG tablet Commonly known as: NORVASC Take 5 mg by mouth daily.   cetirizine 10 MG tablet Commonly known as: ZYRTEC Take 10 mg by mouth as needed for allergies.   cloNIDine 0.1 MG tablet Commonly known as: CATAPRES Take 1 tablet by mouth 2 (two) times daily.   desvenlafaxine 100 MG 24 hr tablet Commonly known as: PRISTIQ Take 100 mg by mouth daily. Pt unsure of dosage   HYDROcodone-acetaminophen 5-325 MG tablet Commonly known as: NORCO/VICODIN Take 1-2 tablets by mouth every 6 (six) hours as needed for severe pain.   letrozole 2.5 MG tablet Commonly known as: [place] 1 TABLET(2.5 MG) BY MOUTH DAILY What changed: See the new instructions.   methylphenidate 20 MG  tablet Commonly known as: RITALIN Take 20 mg by mouth daily.   metoprolol succinate 100 MG 24 hr tablet Commonly known as: TOPROL-XL Take 1 tablet by mouth daily.   omeprazole 40 MG capsule Commonly known as: PRILOSEC TAKE 1 CAPSULE BY MOUTH TWICE DAILY 30 MINUTES BEFORE BREAKFAST AND 30 MINUTES BEFORE DINNER What changed: See the new instructions.   oxybutynin 10 MG 24 hr tablet Commonly known as: DITROPAN-XL Take 1 tablet by mouth daily.   polyethylene glycol  17 g packet Commonly known as: MIRALAX / GLYCOLAX Take 17 g by mouth daily as needed for moderate constipation.            Durable Medical Equipment  (From admission, onward)         Start     Ordered   11/26/18 1247  For home use only DME 3 n 1  Once     11/26/18 1247   11/26/18 1247  For home use only DME Walker rolling  Once    Comments: 5 inch wheels  Question:  Patient needs a walker to treat with the following condition  Answer:  Pelvic fracture ([place])   11/26/18 1247         Allergies  Allergen Reactions  . Percocet [Oxycodone-Acetaminophen] Other (See Comments)    "bugs crawling on me"   Follow-up Information    Redmon, Noelle, PA-C Follow up in 2 week(s).   Specialty: Nurse Practitioner Contact information: [address] [place] [address] [phone]        Sherren Mocha, MD .   Specialty: Cardiology Contact information: [phone] [address] [place] [address] [phone]        Newt Minion, MD. Schedule an appointment as soon as possible for a visit in 2 week(s).   Specialty: Orthopedic Surgery Contact information: [place] [address] [phone]            The results of significant diagnostics from this hospitalization (including imaging, microbiology, ancillary and laboratory) are listed below for reference.    Significant Diagnostic Studies: Mm Diag Breast W/implant Tomo Bilateral  Result Date: 10/30/2018 CLINICAL DATA:  History of right lumpectomy in 2019. EXAM: 2D DIGITAL DIAGNOSTIC BILATERAL MAMMOGRAM WITH IMPLANTS, CAD AND ADJUNCT TOMO The patient has retropectoral implants. Standard and implant displaced views were performed. COMPARISON:  Previous exam(s). ACR Breast Density Category b: There are scattered areas of fibroglandular density. FINDINGS: Postsurgical changes are seen in the right breast. There is no evidence of malignancy in either breast. Bilateral  retropectoral silicone implants are noted. Mammographic images were processed with CAD. IMPRESSION: Postoperative changes in the right breast. No evidence of malignancy in either breast. RECOMMENDATION: Recommend routine annual diagnostic mammogram in 1 year. I have discussed the findings and recommendations with the patient. If applicable, a reminder letter will be sent to the patient regarding the next appointment. BI-RADS CATEGORY  2: Benign. Electronically Signed   By: Zerita Boers M.D.   On: 10/30/2018 14:34   Dg Hip Unilat With Pelvis 2-3 Views Right  Result Date: 11/24/2018 CLINICAL DATA:  Right hip pain status post fall EXAM: DG HIP (WITH OR WITHOUT PELVIS) 2-3V RIGHT COMPARISON:  None. FINDINGS: There is an acute fracture of the parasymphyseal pubic bone on the right. There is no acute displaced fracture or dislocation of the proximal right femur. There is gaseous distention of multiple loops of small bowel scattered throughout the lower abdomen. IMPRESSION: 1. Acute fracture  of the parasymphyseal pubic bone on the right. 2. No acute displaced fracture or dislocation of the proximal right femur. 3. Gaseous distention of multiple small bowel loops. Electronically Signed   By: Constance Holster M.D.   On: 11/24/2018 12:43    Microbiology: Recent Results (from the past 240 hour(s))  Novel Coronavirus, NAA ([facility])     Status: None   Collection Time: 11/22/18 12:00 AM   Specimen: Oropharyngeal(OP) collection in vial transport medium   OROPHARYNGEA  TESTING  Result Value Ref Range Status   SARS-CoV-2, NAA Not Detected Not Detected Final    Comment: Testing was performed using the cobas(R) SARS-CoV-2 test. This nucleic acid amplification test was developed and its performance characteristics determined by [employer]. Nucleic acid amplification tests include PCR and TMA. This test has not been FDA cleared or approved. This test has been authorized by FDA under an Emergency Use  Authorization (EUA). This test is only authorized for the duration of time the declaration that circumstances exist justifying the authorization of the emergency use of in vitro diagnostic tests for detection of SARS-CoV-2 virus and/or diagnosis of COVID-19 infection under section 564(b)(1) of the Act, 21 U.S.C. [id number]) (1), unless the authorization is terminated or revoked sooner. When diagnostic testing is negative, the possibility of a false negative result should be considered in the context of a patient's recent exposures and the presence of clinical signs and symptoms consistent with COVID-19. An individual without symptoms  of COVID-19 and who is not shedding SARS-CoV-2 virus would expect to have a negative (not detected) result in this assay.   SARS Coronavirus 2 [facility] order, Performed in [facility] hospital lab) Nasopharyngeal Nasopharyngeal Swab     Status: None   Collection Time: 11/24/18 11:56 AM   Specimen: Nasopharyngeal Swab  Result Value Ref Range Status   SARS Coronavirus 2 NEGATIVE NEGATIVE Final    Comment: (NOTE) If result is NEGATIVE SARS-CoV-2 target nucleic acids are NOT DETECTED. The SARS-CoV-2 RNA is generally detectable in upper and lower  respiratory specimens during the acute phase of infection. The lowest  concentration of SARS-CoV-2 viral copies this assay can detect is 250  copies / mL. A negative result does not preclude SARS-CoV-2 infection  and should not be used as the sole basis for treatment or other  patient management decisions.  A negative result may occur with  improper specimen collection / handling, submission of specimen other  than nasopharyngeal swab, presence of viral mutation(s) within the  areas targeted by this assay, and inadequate number of viral copies  (<250 copies / mL). A negative result must be combined with clinical  observations, patient history, and epidemiological information. If result is POSITIVE SARS-CoV-2  target nucleic acids are DETECTED. The SARS-CoV-2 RNA is generally detectable in upper and lower  respiratory specimens dur ing the acute phase of infection.  Positive  results are indicative of active infection with SARS-CoV-2.  Clinical  correlation with patient history and other diagnostic information is  necessary to determine patient infection status.  Positive results do  not rule out bacterial infection or co-infection with other viruses. If result is PRESUMPTIVE POSTIVE SARS-CoV-2 nucleic acids MAY BE PRESENT.   A presumptive positive result was obtained on the submitted specimen  and confirmed on repeat testing.  While 2019 novel coronavirus  (SARS-CoV-2) nucleic acids may be present in the submitted sample  additional confirmatory testing may be necessary for epidemiological  and / or clinical management purposes  to differentiate between  SARS-CoV-2 and other Sarbecovirus currently known to infect humans.  If clinically indicated additional testing with an alternate test  methodology [phone]) is advised. The SARS-CoV-2 RNA is generally  detectable in upper and lower respiratory sp ecimens during the acute  phase of infection. The expected result is Negative. Fact Sheet for Patients:  StrictlyIdeas.no Fact Sheet for Healthcare Providers: BankingDealers.co.za This test is not yet approved or cleared by the [place] FDA and has been authorized for detection and/or diagnosis of SARS-CoV-2 by FDA under an Emergency Use Authorization (EUA).  This EUA will remain in effect (meaning this test can be used) for the duration of the COVID-19 declaration under Section 564(b)(1) of the Act, 21 U.S.C. section 360bbb-3(b)(1), unless the authorization is terminated or revoked sooner. Performed at [facility], [address] [place], [place] [address]      Labs: Basic Metabolic Panel: Recent Labs  Lab 11/24/18 1156  11/25/18 0708  NA 126* 135  K 4.3 3.8  CL 89* 102  CO2 21* 25  GLUCOSE 115* 105*  BUN 10 6*  CREATININE 0.56 0.56  CALCIUM 9.4 8.7*  MG  --  1.7  PHOS  --  4.3   Liver Function Tests: Recent Labs  Lab 11/25/18 0708  AST 40  ALT 40  ALKPHOS 72  BILITOT 0.5  PROT 6.4*  ALBUMIN 3.5   No results for input(s): LIPASE, AMYLASE in the last 168 hours. No results for input(s): AMMONIA in the last 168 hours. CBC: Recent Labs  Lab 11/24/18 1156 11/25/18 0708  WBC 11.0* 6.9  NEUTROABS 8.5*  --   HGB 14.2 12.9  HCT 40.0 37.9  MCV 94.6 99.5  PLT 372 303       Signed:  Oswald Hillock MD.  [facility] 11/26/2018, 12:59 PM

## 2018-11-26 NOTE — Progress Notes (Signed)
Correction. found one empty bottle, one half full and 2 unopened bottles of red wine from 1521. Now held at the nursing station. No wine was wasted,

## 2018-11-26 NOTE — Progress Notes (Addendum)
NT notified Charge nurse about bottles of wine found in patients room. Charge nurse found the daughter sleeping in the bed w/ patient and 4 bottles of wine. 1.5 bottles were empty and one styrofoam cup filled with wine.  Charge nurse informed patient that the bottles will be taken out of the room. The patients daughter appears to be very intoxicated, slurred speech when spoken to. Pt denies drinking.  Charge nurse informed Nursing supervisor about incident. When the daughter was woken up and asked to leave the daughter became very upset, talking and cursing at staff. Security called to escort patients daughter out of the hospital.  All 4 bottles of wine held at nursing station.

## 2018-11-26 NOTE — Progress Notes (Signed)
Physical Therapy Treatment Patient Details Name: Mindy Young MRN: [id number] DOB: Mar 28, 1945 Today's Date: 11/26/2018    History of Present Illness 73 year old woman PMH breast cancer, depression, presented to the ED after a fall walking her dog resulting in right hip pain and inability to ambulate. xray= acute parasymphyseal pubic bone fracture, was unable to ambulate, referred for observation, pain control and therapy evaluations.    PT Comments    Pt able to increase ambulation distance and also perform stair training.  Pt was issued gait belt for family to use to assist her as needed. Con't to recommend RW, 3-1 BSC, and HHPT.  She is safe from a d/c point to d/c home with family assistance.   Follow Up Recommendations  Home health PT;Supervision for mobility/OOB     Equipment Recommendations  Rolling walker with 5" wheels;3in1 (PT)    Recommendations for Other Services       Precautions / Restrictions Precautions Precautions: Fall Restrictions Weight Bearing Restrictions: No Other Position/Activity Restrictions: WBAT (per Dr. Sharol Given)    Mobility  Bed Mobility Overal bed mobility: Needs Assistance Bed Mobility: Supine to Sit     Supine to sit: Min assist;HOB elevated     General bed mobility comments: MIN A to A with R LE  Transfers Overall transfer level: Needs assistance Equipment used: Rolling walker (2 wheeled) Transfers: Sit to/from [facility] Sit to Stand: Min guard Stand pivot transfers: Min guard       General transfer comment: min/guard for safety. With SPT, pt almost maintaining TDWB, but progressed to put more weight with gait  Ambulation/Gait Ambulation/Gait assistance: Min guard Gait Distance (Feet): 25 Feet Assistive device: Rolling walker (2 wheeled) Gait Pattern/deviations: Step-to pattern;Decreased weight shift to right;Decreased stance time - right Gait velocity: decreased   General Gait Details: cues for positioning  within RW   Stairs Stairs: Yes Stairs assistance: Min assist;+2 safety/equipment Stair Management: Two rails;Step to pattern;Forwards Number of Stairs: 3 General stair comments: cues for proper technique and to bring hands forward with descent   Wheelchair Mobility    Modified Rankin (Stroke Patients Only)       Balance                                            Cognition Arousal/Alertness: Awake/alert Behavior During Therapy: WFL for tasks assessed/performed Overall Cognitive Status: Within Functional Limits for tasks assessed                                        Exercises General Exercises - Lower Extremity Ankle Circles/Pumps: AROM;Both;10 reps Long Arc Quad: Right;10 reps;Seated    General Comments        Pertinent Vitals/Pain Pain Assessment: Faces Faces Pain Scale: Hurts little more Pain Location: right groin Pain Descriptors / Indicators: Grimacing Pain Intervention(s): Limited activity within patient's tolerance;Monitored during session;Premedicated before session    Home Living                      Prior Function            PT Goals (current goals can now be found in the care plan section) Acute Rehab PT Goals Patient Stated Goal: to go home asap PT Goal Formulation: With patient Time For Goal  Achievement: 12/02/18 Potential to Achieve Goals: Good Progress towards PT goals: Progressing toward goals    Frequency    Min 4X/week      PT Plan Current plan remains appropriate    Co-evaluation              AM-PAC PT "6 Clicks" Mobility   Outcome Measure  Help needed turning from your back to your side while in a flat bed without using bedrails?: A Little Help needed moving from lying on your back to sitting on the side of a flat bed without using bedrails?: A Little Help needed moving to and from a bed to a chair (including a wheelchair)?: A Little Help needed standing up from a chair  using your arms (e.g., wheelchair or bedside chair)?: A Little Help needed to walk in hospital room?: A Little Help needed climbing 3-5 steps with a railing? : A Little 6 Click Score: 18    End of Session Equipment Utilized During Treatment: Gait belt Activity Tolerance: Patient tolerated treatment well Patient left: in chair;with call bell/phone within reach;with family/visitor present Nurse Communication: Mobility status PT Visit Diagnosis: Difficulty in walking, not elsewhere classified (R26.2)     Time: [id number] PT Time Calculation (min) (ACUTE ONLY): 27 min  Charges:  $Gait Training: 23-37 mins                     Langdon Crosson L. Tamala Julian, [place] Pager [phone] 11/26/2018    Galen Manila 11/26/2018, 12:22 PM

## 2018-11-26 NOTE — Progress Notes (Signed)
Patient's son in law, Thedore Mins, came to pick up the overnight bag and Administrator. Thedore Mins advised that there is no charger in the room. Patient as my witness, we also looked in other bags, did not find the Administrator. Overnight bag was dropped off to the son in law in the lobby. Patient was asked by the [place] (assistant directorabout the bottles of wine (2 unopened bottles and half bottle and one empty bottle) and the black oil vapor that was at the nurses station, per patient dispose all of those.

## 2018-11-26 NOTE — Progress Notes (Signed)
Patient's daughter had been a visitor since 10/3 and stayed overnight. Tonight the daughter came back to hospital with 4 bottles of red wine and dinner for herself. She stated that she was celebrating some kind of anniversary. ( one opened and 3 unopened) This RN told daughter that patient is absolutely not to have any wine.  Daughter verbally understand.  Later tonight, found daughter in bed with patient unaware if she is intoxicated. CN and AC notified The daughter was argumentative and resisted to leave but was escorted out by securities.  NP on called notified, BAL to be drawn prior to any RX administration. The opened wine bottle was wasted and the other 3 are at the nursing station.  Patient VVS will continue to monitor patient.

## 2018-11-26 NOTE — Progress Notes (Signed)
Patients daughter called at 35 stating the pharmacy the prescription was sent to doesn't have the medication in stock. RN confirmed with the pharmacist. MD paged to have it resent to a different pharmacy.

## 2018-11-26 NOTE — TOC Transition Note (Addendum)
Transition of Care [facility]) - CM/SW Discharge Note   Patient Details  Name: Mindy Young MRN: [id number] Date of Birth: [birth year]  [facility] CM/SW Contact:  Nila Nephew, LCSW Phone Number: [phone] 11/26/2018, 1:58 PM   Clinical Narrative:   Met with pt to discuss DC needs and assess any SA issues (see notes re: events of last night). Pt needs 3n1 and RW- will be provided through [facility]. Agrees with recommendation for home health PT as well- reviewing agency options. Requested [facility], representative took referral.   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 States currently she drinks 1-2 drinks 1-2 xweek, does not feel it is interfering with her health or relationships. States she recently re-engaged in psychiatric outpatient care with Dr Buddy Duty- states she started taking Pristique about 3 weeks ago (states she was taking antidepressants for a while until last year when providers had her stop while being worked up for breast cancer, states she can tell her mood is more depressed lately and she is glad she is back on medications). States she has had several therapists in the past, not wanting to engage in therapy currently. Pt states she is worried about her daughter and CSW provided emotional support.    Final next level of care: [place] Barriers to Discharge: No Barriers Identified   Patient Goals and CMS Choice Patient states their goals for this hospitalization and ongoing recovery are:: "I would like to have therapy" CMS Medicare.gov Compare Post Acute Care list provided to:: Patient Choice offered to / list presented to : Patient  Discharge Placement                       Discharge Plan and Services                DME  Arranged: 3-N-1, [place] rolling DME Agency: [facility] Date DME Agency Contacted: 11/26/18 Time DME Agency Contacted: [phone] Representative spoke with at DME Agency: [place]: PT [place]: Alvis Lemmings, Metallurgist      Social Determinants of Health ([place]) Interventions     Readmission Risk Interventions No flowsheet data found.

## 2018-11-26 NOTE — Progress Notes (Signed)
Emotional support given to patient and informed pt of her BAL. (negative),  Ativan 1 mg PO given.  Will continue to monitor patient.

## 2018-11-29 ENCOUNTER — Other Ambulatory Visit: Payer: Self-pay | Admitting: Orthopedic Surgery

## 2018-11-29 ENCOUNTER — Telehealth: Payer: Self-pay | Admitting: Orthopaedic Surgery

## 2018-11-29 MED ORDER — HYDROCODONE-ACETAMINOPHEN 5-325 MG PO TABS: 1.0000 | ORAL_TABLET | Freq: Four times a day (QID) | ORAL | 0 refills | Status: DC | PRN

## 2018-11-29 NOTE — Telephone Encounter (Signed)
Please advise 

## 2018-11-29 NOTE — Telephone Encounter (Signed)
Sent in to pharmacy.  

## 2018-11-29 NOTE — Telephone Encounter (Signed)
Mindy Young called stating she fell last weekend and went to the ER where she was diagnosed with a pelvic fx and prescribed Hydrocodone.  Mindy Young states she was also told to follow-up with Dr. Durward Fortes which she scheduled for 12/11/18.  Mindy Young states she only has 5 pills remaining and requesting prescription refill of Hydrocodone to be sent to [facility] at [address]

## 2018-11-29 NOTE — Telephone Encounter (Signed)
Spoke with patient and advised her that hydrocodone was sent to her pharmacy.

## 2018-12-05 ENCOUNTER — Telehealth: Payer: Self-pay | Admitting: Orthopaedic Surgery

## 2018-12-05 NOTE — Telephone Encounter (Signed)
Per Dwyane Dee at [facility], patient is out of pain medication. Requesting a refill on pain medication, and possible a muscle relaxer. Per PT, patient's pain is so severe at times she is unable to get up at all. Please call to advise patient.

## 2018-12-05 NOTE — Telephone Encounter (Signed)
Please advise 

## 2018-12-06 ENCOUNTER — Other Ambulatory Visit: Payer: Self-pay | Admitting: Orthopaedic Surgery

## 2018-12-06 MED ORDER — HYDROCODONE-ACETAMINOPHEN 5-325 MG PO TABS: 1.0000 | ORAL_TABLET | Freq: Four times a day (QID) | ORAL | 0 refills | Status: DC | PRN

## 2018-12-06 MED ORDER — METHOCARBAMOL 500 MG PO TABS: ORAL_TABLET | ORAL | 0 refills | Status: DC

## 2018-12-06 NOTE — Telephone Encounter (Signed)
Spoke with patient and advised her that medication has been sent to her pharmacy.

## 2018-12-06 NOTE — Telephone Encounter (Signed)
Sent in hydrocodone-please prescribe robaxin 500mg  #30 1 tab tid prn-thanks

## 2018-12-08 ENCOUNTER — Emergency Department (HOSPITAL_COMMUNITY)
Admission: EM | Admit: 2018-12-08 | Discharge: 2018-12-09 | Disposition: A | Discharge status: Home / Self Care | Payer: Medicare Other | Source: Home / Self Care | Attending: Emergency Medicine | Admitting: Emergency Medicine

## 2018-12-08 ENCOUNTER — Other Ambulatory Visit: Payer: Self-pay

## 2018-12-08 ENCOUNTER — Encounter (HOSPITAL_COMMUNITY): Payer: Self-pay | Admitting: Emergency Medicine

## 2018-12-08 DIAGNOSIS — E222 Syndrome of inappropriate secretion of antidiuretic hormone: Secondary | ICD-10-CM | POA: Diagnosis not present

## 2018-12-08 DIAGNOSIS — R112 Nausea with vomiting, unspecified: Secondary | ICD-10-CM | POA: Insufficient documentation

## 2018-12-08 DIAGNOSIS — K5903 Drug induced constipation: Secondary | ICD-10-CM

## 2018-12-08 DIAGNOSIS — Z79899 Other long term (current) drug therapy: Secondary | ICD-10-CM | POA: Insufficient documentation

## 2018-12-08 DIAGNOSIS — E871 Hypo-osmolality and hyponatremia: Secondary | ICD-10-CM | POA: Diagnosis not present

## 2018-12-08 DIAGNOSIS — Z853 Personal history of malignant neoplasm of breast: Secondary | ICD-10-CM | POA: Insufficient documentation

## 2018-12-08 DIAGNOSIS — R34 Anuria and oliguria: Secondary | ICD-10-CM | POA: Insufficient documentation

## 2018-12-08 DIAGNOSIS — F17210 Nicotine dependence, cigarettes, uncomplicated: Secondary | ICD-10-CM | POA: Insufficient documentation

## 2018-12-08 DIAGNOSIS — I10 Essential (primary) hypertension: Secondary | ICD-10-CM | POA: Insufficient documentation

## 2018-12-08 NOTE — ED Triage Notes (Signed)
Pt arrived via EMS.   EMS states pt is still in pain from a pelvic fx 2 weeks ago. Pt is on pain meds, and has been feeling constipated. Pt told EMS she feels more stressed and has been making less urine than normal. Pt has shaking and tremors in her hands. Pt has a hx of tardive dyskinesia. Pt states she is nauseated, even after taking her prescription of phenergan.

## 2018-12-09 ENCOUNTER — Encounter (HOSPITAL_BASED_OUTPATIENT_CLINIC_OR_DEPARTMENT_OTHER): Payer: Self-pay | Admitting: *Deleted

## 2018-12-09 ENCOUNTER — Inpatient Hospital Stay (HOSPITAL_BASED_OUTPATIENT_CLINIC_OR_DEPARTMENT_OTHER)
Admission: EM | Admit: 2018-12-09 | Discharge: 2018-12-13 | DRG: 643 | Disposition: A | Discharge status: Home Health | Payer: Medicare Other | Attending: Internal Medicine | Admitting: Internal Medicine

## 2018-12-09 ENCOUNTER — Emergency Department (HOSPITAL_BASED_OUTPATIENT_CLINIC_OR_DEPARTMENT_OTHER): Payer: Medicare Other

## 2018-12-09 ENCOUNTER — Emergency Department (HOSPITAL_COMMUNITY): Payer: Medicare Other

## 2018-12-09 DIAGNOSIS — R4701 Aphasia: Secondary | ICD-10-CM | POA: Diagnosis present

## 2018-12-09 DIAGNOSIS — F1010 Alcohol abuse, uncomplicated: Secondary | ICD-10-CM | POA: Diagnosis present

## 2018-12-09 DIAGNOSIS — Z8042 Family history of malignant neoplasm of prostate: Secondary | ICD-10-CM | POA: Diagnosis not present

## 2018-12-09 DIAGNOSIS — E871 Hypo-osmolality and hyponatremia: Secondary | ICD-10-CM | POA: Diagnosis present

## 2018-12-09 DIAGNOSIS — E222 Syndrome of inappropriate secretion of antidiuretic hormone: Principal | ICD-10-CM | POA: Diagnosis present

## 2018-12-09 DIAGNOSIS — K219 Gastro-esophageal reflux disease without esophagitis: Secondary | ICD-10-CM | POA: Diagnosis present

## 2018-12-09 DIAGNOSIS — Z85820 Personal history of malignant melanoma of skin: Secondary | ICD-10-CM | POA: Diagnosis not present

## 2018-12-09 DIAGNOSIS — S32501D Unspecified fracture of right pubis, subsequent encounter for fracture with routine healing: Secondary | ICD-10-CM | POA: Diagnosis not present

## 2018-12-09 DIAGNOSIS — E860 Dehydration: Secondary | ICD-10-CM | POA: Diagnosis present

## 2018-12-09 DIAGNOSIS — R631 Polydipsia: Secondary | ICD-10-CM | POA: Diagnosis present

## 2018-12-09 DIAGNOSIS — I10 Essential (primary) hypertension: Secondary | ICD-10-CM | POA: Diagnosis present

## 2018-12-09 DIAGNOSIS — G92 Toxic encephalopathy: Secondary | ICD-10-CM

## 2018-12-09 DIAGNOSIS — Z170 Estrogen receptor positive status [ER+]: Secondary | ICD-10-CM

## 2018-12-09 DIAGNOSIS — Z803 Family history of malignant neoplasm of breast: Secondary | ICD-10-CM

## 2018-12-09 DIAGNOSIS — Z853 Personal history of malignant neoplasm of breast: Secondary | ICD-10-CM | POA: Diagnosis not present

## 2018-12-09 DIAGNOSIS — R479 Unspecified speech disturbances: Secondary | ICD-10-CM | POA: Diagnosis not present

## 2018-12-09 DIAGNOSIS — F419 Anxiety disorder, unspecified: Secondary | ICD-10-CM | POA: Diagnosis present

## 2018-12-09 DIAGNOSIS — K5903 Drug induced constipation: Secondary | ICD-10-CM | POA: Diagnosis present

## 2018-12-09 DIAGNOSIS — Z923 Personal history of irradiation: Secondary | ICD-10-CM

## 2018-12-09 DIAGNOSIS — D72829 Elevated white blood cell count, unspecified: Secondary | ICD-10-CM | POA: Diagnosis present

## 2018-12-09 DIAGNOSIS — Z885 Allergy status to narcotic agent status: Secondary | ICD-10-CM

## 2018-12-09 DIAGNOSIS — C50311 Malignant neoplasm of lower-inner quadrant of right female breast: Secondary | ICD-10-CM | POA: Diagnosis not present

## 2018-12-09 DIAGNOSIS — Z79811 Long term (current) use of aromatase inhibitors: Secondary | ICD-10-CM | POA: Diagnosis not present

## 2018-12-09 DIAGNOSIS — G9341 Metabolic encephalopathy: Secondary | ICD-10-CM | POA: Diagnosis present

## 2018-12-09 DIAGNOSIS — Z825 Family history of asthma and other chronic lower respiratory diseases: Secondary | ICD-10-CM | POA: Diagnosis not present

## 2018-12-09 DIAGNOSIS — Z20828 Contact with and (suspected) exposure to other viral communicable diseases: Secondary | ICD-10-CM | POA: Diagnosis present

## 2018-12-09 DIAGNOSIS — W19XXXD Unspecified fall, subsequent encounter: Secondary | ICD-10-CM | POA: Diagnosis present

## 2018-12-09 DIAGNOSIS — G2401 Drug induced subacute dyskinesia: Secondary | ICD-10-CM | POA: Diagnosis present

## 2018-12-09 DIAGNOSIS — F329 Major depressive disorder, single episode, unspecified: Secondary | ICD-10-CM | POA: Diagnosis present

## 2018-12-09 DIAGNOSIS — F17210 Nicotine dependence, cigarettes, uncomplicated: Secondary | ICD-10-CM | POA: Diagnosis present

## 2018-12-09 DIAGNOSIS — R569 Unspecified convulsions: Secondary | ICD-10-CM | POA: Diagnosis present

## 2018-12-09 LAB — URINALYSIS, ROUTINE W REFLEX MICROSCOPIC
Bilirubin Urine: NEGATIVE
Bilirubin Urine: NEGATIVE
Glucose, UA: NEGATIVE mg/dL
Glucose, UA: 100 mg/dL — AB
Hgb urine dipstick: NEGATIVE
Hgb urine dipstick: NEGATIVE
Ketones, ur: NEGATIVE mg/dL
Ketones, ur: NEGATIVE mg/dL
Leukocytes,Ua: NEGATIVE
Leukocytes,Ua: NEGATIVE
Nitrite: NEGATIVE
Nitrite: NEGATIVE
Protein, ur: NEGATIVE mg/dL
Protein, ur: NEGATIVE mg/dL
Specific Gravity, Urine: 1.003 — ABNORMAL LOW (ref 1.005–1.030)
Specific Gravity, Urine: <1.005 — ABNORMAL LOW (ref 1.005–1.030)
pH: 8 (ref 5.0–8.0)
pH: 6 (ref 5.0–8.0)

## 2018-12-09 LAB — COMPREHENSIVE METABOLIC PANEL
ALT: 19 U/L (ref 0–44)
AST: 24 U/L (ref 15–41)
Albumin: 3.8 g/dL (ref 3.5–5.0)
Alkaline Phosphatase: 146 U/L — ABNORMAL HIGH (ref 38–126)
Anion gap: 12 (ref 5–15)
BUN: 12 mg/dL (ref 8–23)
CO2: 22 mmol/L (ref 22–32)
Calcium: 9.1 mg/dL (ref 8.9–10.3)
Chloride: 87 mmol/L — ABNORMAL LOW (ref 98–111)
Creatinine, Ser: 0.6 mg/dL (ref 0.44–1.00)
GFR calc Af Amer: >60 mL/min (ref >60)
GFR calc non Af Amer: >60 mL/min (ref >60)
Glucose, Bld: 122 mg/dL — ABNORMAL HIGH (ref 70–99)
Potassium: 3.7 mmol/L (ref 3.5–5.1)
Sodium: 121 mmol/L — ABNORMAL LOW (ref 135–145)
Total Bilirubin: 0.7 mg/dL (ref 0.3–1.2)
Total Protein: 7.4 g/dL (ref 6.5–8.1)

## 2018-12-09 LAB — BASIC METABOLIC PANEL
Anion gap: 13 (ref 5–15)
BUN: 9 mg/dL (ref 8–23)
CO2: 20 mmol/L — ABNORMAL LOW (ref 22–32)
Calcium: 8.8 mg/dL — ABNORMAL LOW (ref 8.9–10.3)
Chloride: 92 mmol/L — ABNORMAL LOW (ref 98–111)
Creatinine, Ser: 0.6 mg/dL (ref 0.44–1.00)
GFR calc Af Amer: >60 mL/min (ref >60)
GFR calc non Af Amer: >60 mL/min (ref >60)
Glucose, Bld: 106 mg/dL — ABNORMAL HIGH (ref 70–99)
Potassium: 3.8 mmol/L (ref 3.5–5.1)
Sodium: 125 mmol/L — ABNORMAL LOW (ref 135–145)

## 2018-12-09 LAB — CBC WITH DIFFERENTIAL/PLATELET
Abs Immature Granulocytes: 0.08 10*3/uL — ABNORMAL HIGH (ref 0.00–0.07)
Basophils Absolute: 0.1 10*3/uL (ref 0.0–0.1)
Basophils Relative: 0 %
Eosinophils Absolute: 0.1 10*3/uL (ref 0.0–0.5)
Eosinophils Relative: 0 %
HCT: 37.4 % (ref 36.0–46.0)
Hemoglobin: 13.1 g/dL (ref 12.0–15.0)
Immature Granulocytes: 1 %
Lymphocytes Relative: 16 %
Lymphs Abs: 2 10*3/uL (ref 0.7–4.0)
MCH: 33.2 pg (ref 26.0–34.0)
MCHC: 35 g/dL (ref 30.0–36.0)
MCV: 94.7 fL (ref 80.0–100.0)
Monocytes Absolute: 1.6 10*3/uL — ABNORMAL HIGH (ref 0.1–1.0)
Monocytes Relative: 12 %
Neutro Abs: 9 10*3/uL — ABNORMAL HIGH (ref 1.7–7.7)
Neutrophils Relative %: 71 %
Platelets: 553 10*3/uL — ABNORMAL HIGH (ref 150–400)
RBC: 3.95 MIL/uL (ref 3.87–5.11)
RDW: 12 % (ref 11.5–15.5)
WBC: 12.7 10*3/uL — ABNORMAL HIGH (ref 4.0–10.5)
nRBC: 0 % (ref 0.0–0.2)

## 2018-12-09 LAB — PROTIME-INR
INR: 1 (ref 0.8–1.2)
Prothrombin Time: 13.4 seconds (ref 11.4–15.2)

## 2018-12-09 LAB — TROPONIN I (HIGH SENSITIVITY)
Troponin I (High Sensitivity): 4 ng/L (ref <18)
Troponin I (High Sensitivity): 2 ng/L (ref <18)

## 2018-12-09 LAB — APTT: aPTT: 28 seconds (ref 24–36)

## 2018-12-09 LAB — ETHANOL: Alcohol, Ethyl (B): <10 mg/dL (ref <10)

## 2018-12-09 MED ORDER — IOHEXOL 350 MG/ML SOLN
40.0000 mL | Freq: Once | INTRAVENOUS | Status: AC | PRN
  Administered 2018-12-09: 23:00:00 40 mL via INTRAVENOUS

## 2018-12-09 MED ORDER — SODIUM CHLORIDE 0.9 % IV SOLN: Freq: Once | INTRAVENOUS | Status: DC

## 2018-12-09 MED ORDER — IOHEXOL 350 MG/ML SOLN: 50.0000 mL | Freq: Once | INTRAVENOUS | Status: DC | PRN

## 2018-12-09 MED ORDER — SODIUM CHLORIDE 0.9 % IV BOLUS
1000.0000 mL | Freq: Once | INTRAVENOUS | Status: AC
  Administered 2018-12-09: 1000 mL via INTRAVENOUS

## 2018-12-09 MED ORDER — IOHEXOL 350 MG/ML SOLN
80.0000 mL | Freq: Once | INTRAVENOUS | Status: AC | PRN
  Administered 2018-12-09: 80 mL via INTRAVENOUS

## 2018-12-09 MED ORDER — ONDANSETRON HCL 4 MG/2ML IJ SOLN
4.0000 mg | Freq: Once | INTRAMUSCULAR | Status: AC
  Administered 2018-12-09: 4 mg via INTRAVENOUS
  Filled 2018-12-09: qty 2

## 2018-12-09 MED ORDER — HYDROMORPHONE HCL 1 MG/ML IJ SOLN
0.5000 mg | Freq: Once | INTRAMUSCULAR | Status: AC
  Administered 2018-12-09: 0.5 mg via INTRAMUSCULAR
  Filled 2018-12-09: qty 1

## 2018-12-09 MED ORDER — LORAZEPAM 2 MG/ML IJ SOLN
1.0000 mg | Freq: Once | INTRAMUSCULAR | Status: AC
  Administered 2018-12-10: 1 mg via INTRAVENOUS

## 2018-12-09 MED ORDER — LORAZEPAM 2 MG/ML IJ SOLN
INTRAMUSCULAR | Status: AC
  Filled 2018-12-09: qty 1

## 2018-12-09 NOTE — ED Notes (Signed)
Per pt friend at bedside- home health called her and told her she has been having issues getting words out since 1130.

## 2018-12-09 NOTE — ED Notes (Signed)
Pt returned from CT. During CT, pt became aphasic but was able to follow commands. No LOC. [facility] at bedside. Upon return from CT, pt is able to verbally communicate and is A&O X4.

## 2018-12-09 NOTE — H&P (Addendum)
History and Physical    Mindy Young [id number] DOB: 15-Feb-1946 DOA: 12/09/2018  Referring MD/NP/PA:   PCP: Lennie Odor, PA-C   Patient coming from:  The patient is coming from home.  At baseline, pt is independent for most of ADL.        Chief Complaint: difficult speaking and seizure like activity  HPI: Mindy Young is a 73 y.o. female with medical history significant of hypertension, GERD, depression, anxiety, tardive dyskinesia, C1 cervical spine fracture, breast cancer (right lumpectomy), alcohol and tobacco abuse, hyponatremia, recent pubic bone fracture, who presents with difficulty speaking and seizure-like activity.  Per report, pt was noted to have difficulty speaking and stuttering at about  11;30 AM. She had 3 additional similar episodes. Per report, en route to the hospital, she had an episode in the ambulance where she had left-sided gaze and was not responding with eyes open.  She denies unilateral weakness or numbness in extremities to me.  She has right hip pain due to recent pubic bone fracture, therefore she has poor effort in moving right leg.  No vision loss or hearing loss. Pt states that he has nausea and vomited yesterday, but no nausea vomiting, diarrhea or abdominal pain today.  Denies chest pain, shortness of breath, cough, fever or chills.  No symptoms of UTI. She is constipated recently. Pt was initially seen in [facility], and transferred to [facility] for further evaluation and treatment.  ED Course: pt was found to have WBC 12.7, INR 1.0, PTT 28, negative urinalysis, troponin 4, 2, pending COVID-19 test, alcohol level less than 10, sodium 121, renal function normal, temperature normal, blood pressure 161/89, tachycardia, oxygen saturation 92 to 99% on room air. Ct of head is unremarkable. CT angio head and neck with a questionable right M2 stenosis versus occlusion versus tortuous vessel artifact. CT of brain perfusion is negative. Pt is admitted to [facility] as inpt. Dr. Rory Percy  of neuro was consulted.    Review of Systems:   General: no fevers, chills, no body weight gain, has fatigue HEENT: no blurry vision, hearing changes or sore throat Respiratory: no dyspnea, coughing, wheezing CV: no chest pain, no palpitations GI: no nausea, vomiting, abdominal pain, diarrhea, has constipation GU: no dysuria, burning on urination, increased urinary frequency, hematuria  Ext: no leg edema Neuro: no unilateral weakness, numbness, or tingling, no vision change or hearing loss, has difficult speaking and seizure-like activity. Skin: no rash, no skin tear. MSK: No muscle spasm, no deformity, no limitation of range of movement in spin Heme: No easy bruising.  Travel history: No recent long distant travel.  Allergy:  Allergies  Allergen Reactions  . Percocet [Oxycodone-Acetaminophen] Other (See Comments)    "bugs crawling on me"    Past Medical History:  Diagnosis Date  . Allergy   . Anemia 06/29/2011  . Anxiety   . Arthritis   . Breast cancer ([place])    right breast  . C1 cervical fracture ([place]) 02/1997  . Cancer ([place])    melanoma  . Depression   . Family history of breast cancer   . Family history of prostate cancer   . GERD (gastroesophageal reflux disease)   . Hypertension   . Tardive dyskinesia     Past Surgical History:  Procedure Laterality Date  . ABDOMINAL HYSTERECTOMY    . ASPIRATION OF ABSCESS Right 11/20/2017   Procedure: ASPIRATION OF RIGHT AXILLARY SEROMA;  Surgeon: Rolm Bookbinder, MD;  Location: [facility];  Service: General;  Laterality: Right;  . AUGMENTATION MAMMAPLASTY Bilateral    biateral implants , approx 2015  . BREAST LUMPECTOMY Right 11/02/2017   re-ex 11-20-17  . BREAST LUMPECTOMY WITH RADIOACTIVE SEED AND SENTINEL LYMPH NODE BIOPSY Right 11/02/2017   Procedure: BREAST LUMPECTOMY WITH RADIOACTIVE SEED AND SENTINEL LYMPH NODE BIOPSY;  Surgeon: Rolm Bookbinder, MD;  Location: [place];  Service:  General;  Laterality: Right;  . CHOLECYSTECTOMY    . collarbone    . INCONTINENCE SURGERY    . KNEE SURGERY     removal of cyst, repair of cartiledge  . LAPAROSCOPY     for endometriosis  . RE-EXCISION OF BREAST LUMPECTOMY Right 11/20/2017   Procedure: RE-EXCISION OF RIGHT BREAST MARGINS;  Surgeon: Rolm Bookbinder, MD;  Location: [facility];  Service: General;  Laterality: Right;  . ROTATOR CUFF REPAIR     left    Social History:  reports that she has been smoking cigarettes. She has been smoking about 1.00 pack per day. She has never used smokeless tobacco. She reports current alcohol use of about 21.0 standard drinks of alcohol per week. She reports that she does not use drugs.  Family History:  Family History  Problem Relation Age of Onset  . COPD Mother   . Prostate cancer Father 24       seed implant for treatment  . Colon polyps Father        'a few'  . Breast cancer Sister 76  . Breast cancer Maternal Aunt        dx >50  . Breast cancer Other 35       bilateral  . Colon cancer Neg Hx      Prior to Admission medications   Medication Sig Start Date End Date Taking? Authorizing Provider  ALPRAZolam Duanne Moron) 0.5 MG tablet Take 0.5 mg by mouth 2 (two) times daily as needed. For anxiety 08/10/10   [provider]  amLODipine (NORVASC) 5 MG tablet Take 5 mg by mouth daily.    [provider]  cetirizine (ZYRTEC) 10 MG tablet Take 10 mg by mouth as needed for allergies.    [provider]  cloNIDine (CATAPRES) 0.1 MG tablet Take 1 tablet by mouth 2 (two) times daily. 11/07/16   [provider]  desvenlafaxine (PRISTIQ) 50 MG 24 hr tablet Take 50 mg by mouth daily.    [provider]  HYDROcodone-acetaminophen (NORCO/VICODIN) 5-325 MG tablet Take 1-2 tablets by mouth every 6 (six) hours as needed for severe pain. Patient not taking: Reported on 12/08/2018 11/29/18   Cherylann Ratel, PA-C  HYDROcodone-acetaminophen  (NORCO/VICODIN) 5-325 MG tablet Take 1 tablet by mouth every 6 (six) hours as needed for moderate pain. 12/06/18   Garald Balding, MD  letrozole ([place]) 2.5 MG tablet TAKE 1 TABLET(2.5 MG) BY MOUTH DAILY Patient taking differently: Take 2.5 mg by mouth daily.  10/12/18   Nicholas Lose, MD  methocarbamol (ROBAXIN) 500 MG tablet Take 1 tablet TID prn Patient not taking: Reported on 12/08/2018 12/06/18   Garald Balding, MD  methylphenidate (RITALIN) 20 MG tablet Take 20 mg by mouth daily.     [provider]  metoprolol succinate (TOPROL-XL) 100 MG 24 hr tablet Take 100 mg by mouth daily.  02/19/18   [provider]  omeprazole (PRILOSEC) 40 MG capsule TAKE 1 CAPSULE BY MOUTH TWICE DAILY 30 MINUTES BEFORE BREAKFAST AND 30 MINUTES BEFORE DINNER Patient taking differently: Take 40 mg by mouth 2 (  two) times daily. 30 minutes before breakfast, and 30  minutes before dinner. 10/12/18   Mauri Pole, MD  oxybutynin (DITROPAN-XL) 10 MG 24 hr tablet Take 10 mg by mouth daily.  12/05/16   [provider]  polyethylene glycol (MIRALAX / GLYCOLAX) 17 g packet Take 17 g by mouth daily as needed for moderate constipation. 11/26/18   Oswald Hillock, MD  promethazine (PHENERGAN) 25 MG tablet Take 25 mg by mouth every 6 (six) hours as needed for nausea or vomiting.    [provider]    Physical Exam: Vitals:   12/09/18 2129 12/09/18 2300 12/09/18 2343 12/10/18 0010  BP:  (!) 161/89 (!) 168/96 (!) 153/95  Pulse: (!) 102 96 (!) 104 (!) 122  Resp:  15 17 18   Temp:      TempSrc:      SpO2: 96% 94% 96% 96%  Weight:      Height:       General: Not in acute distress HEENT:       Eyes: PERRL, EOMI, no scleral icterus.       ENT: No discharge from the ears and nose, no pharynx injection, no tonsillar enlargement.        Neck: No JVD, no bruit, no mass felt. Heme: No neck lymph node enlargement. Cardiac: S1/S2, RRR, No murmurs, No gallops or rubs. Respiratory: No  rales, wheezing, rhonchi or rubs. GI: Soft, nondistended, nontender, no rebound pain, no organomegaly, BS present. GU: No hematuria Ext: No pitting leg edema bilaterally. 2+DP/PT pulse bilaterally. Musculoskeletal: No joint deformities, No joint redness or warmth, no limitation of ROM in spin. Skin: No rashes.  Neuro: Alert, oriented X3, cranial nerves II-XII grossly intact, moves all extremities normally. Muscle strength 5/5 in all extremities except for right leg (pt has right hip pain and poor effort in moving her right leg. R leg strength cannot be assessed accurately). Sensation to light touch intact.  Psych: Patient is not psychotic, no suicidal or hemocidal ideation.  Labs on Admission: I have personally reviewed following labs and imaging studies  CBC: Recent Labs  Lab 12/09/18 1910  WBC 12.7*  NEUTROABS 9.0*  HGB 13.1  HCT 37.4  MCV 94.7  PLT [id number]*   Basic Metabolic Panel: Recent Labs  Lab 12/09/18 1910 12/09/18 2213  NA 121* 125*  K 3.7 3.8  CL 87* 92*  CO2 22 20*  GLUCOSE 122* 106*  BUN 12 9  CREATININE 0.60 0.60  CALCIUM 9.1 8.8*   GFR: Estimated Creatinine Clearance: 57.9 mL/min (by C-G formula based on SCr of 0.6 mg/dL). Liver Function Tests: Recent Labs  Lab 12/09/18 1910  AST 24  ALT 19  ALKPHOS 146*  BILITOT 0.7  PROT 7.4  ALBUMIN 3.8   No results for input(s): LIPASE, AMYLASE in the last 168 hours. No results for input(s): AMMONIA in the last 168 hours. Coagulation Profile: Recent Labs  Lab 12/09/18 2213  INR 1.0   Cardiac Enzymes: No results for input(s): CKTOTAL, CKMB, CKMBINDEX, TROPONINI in the last 168 hours. BNP (last 3 results) No results for input(s): PROBNP in the last 8760 hours. HbA1C: No results for input(s): HGBA1C in the last 72 hours. CBG: No results for input(s): GLUCAP in the last 168 hours. Lipid Profile: No results for input(s): CHOL, HDL, LDLCALC, TRIG, CHOLHDL, LDLDIRECT in the last 72 hours. Thyroid Function  Tests: No results for input(s): TSH, T4TOTAL, FREET4, T3FREE, THYROIDAB in the last 72 hours. Anemia Panel: No results for input(s):  VITAMINB12, FOLATE, FERRITIN, TIBC, IRON, RETICCTPCT in the last 72 hours. Urine analysis:    Component Value Date/Time   COLORURINE YELLOW 12/09/2018 1942   APPEARANCEUR CLEAR 12/09/2018 1942   LABSPEC <1.005 (L) 12/09/2018 1942   PHURINE 6.0 12/09/2018 1942   GLUCOSEU 100 (A) 12/09/2018 1942   HGBUR NEGATIVE 12/09/2018 1942   BILIRUBINUR NEGATIVE 12/09/2018 1942   KETONESUR NEGATIVE 12/09/2018 1942   PROTEINUR NEGATIVE 12/09/2018 1942   UROBILINOGEN 0.2 06/25/2011 1923   NITRITE NEGATIVE 12/09/2018 1942   LEUKOCYTESUR NEGATIVE 12/09/2018 1942   Sepsis Labs: @LABRCNTIP (procalcitonin:4,lacticidven:4) )No results found for this or any previous visit (from the past 240 hour(s)).   Radiological Exams on Admission: Ct Angio Head W Or Wo Contrast  Result Date: 12/09/2018 CLINICAL DATA:  Initial evaluation for acute speech difficulty. Left-sided weakness. EXAM: CT ANGIOGRAPHY HEAD AND NECK TECHNIQUE: Multidetector CT imaging of the head and neck was performed using the standard protocol during bolus administration of intravenous contrast. Multiplanar CT image reconstructions and MIPs were obtained to evaluate the vascular anatomy. Carotid stenosis measurements (when applicable) are obtained utilizing NASCET criteria, using the distal internal carotid diameter as the denominator. CONTRAST:  87mL OMNIPAQUE IOHEXOL 350 MG/ML SOLN COMPARISON:  Prior head CT from earlier the same day. FINDINGS: CT HEAD FINDINGS Brain: Examination somewhat degraded by motion artifact. Age-related cerebral atrophy with moderate chronic microvascular ischemic disease. No acute intracranial hemorrhage. No acute large vessel territory infarct. No mass lesion, midline shift or mass effect. No hydrocephalus. No extra-axial fluid collection. Vascular: No hyperdense vessel. Scattered vascular  calcifications noted within the carotid siphons. Skull: Scalp soft tissues and calvarium within normal limits. Sinuses: Chronic left sphenoid sinusitis noted. Visualized paranasal sinuses are otherwise clear. No mastoid effusion. Orbits: Globes and orbital soft tissues demonstrate no acute finding. Review of the MIP images confirms the above findings CTA NECK FINDINGS Aortic arch: Visualized aortic arch normal in caliber with normal branch pattern. Moderate atherosclerotic change about the aortic arch and origin of the great vessels without hemodynamically significant stenosis. Visualized subclavian arteries widely patent. Right carotid system: Right CCA tortuous proximally but widely patent to the bifurcation without stenosis. No significant atheromatous narrowing about the right bifurcation. Focal pseudoaneurysm extending from the medial aspect of the mid-distal right ICA measures 7 x 4 x 6 mm (series 11, image 200). No significant intraluminal thrombus or intimal irregularity. Right ICA otherwise widely patent without stenosis or other acute abnormality. Left carotid system: Left CCA tortuous proximally but widely patent to the bifurcation without stenosis. No significant atheromatous narrowing about the left bifurcation. Left ICA widely patent distally to the skull base without stenosis, dissection, or occlusion. Vertebral arteries: Both vertebral arteries arise from the subclavian arteries. Mixed plaque at the origin of the left vertebral artery with associated stenosis of up to approximately 50%. Additional moderate multifocal areas of narrowing within the right V2 segment at the levels of C5 and C3, and on the left at C1 related to intrinsic compression from uncovertebral disease. Vertebral arteries otherwise patent without stenosis or vascular occlusion. Skeleton: No acute osseous abnormality. No discrete lytic or blastic osseous lesions. Moderate multilevel cervical spondylosis at C4-5 through C6-7, with  ankylosis of the C4 and C5 vertebral bodies. Prominent degenerative changes about the C1-2 articulation. Other neck: No other acute soft tissue abnormality within the neck. No mass lesion or adenopathy. Subcentimeter hypodense nodule within the inferior right thyroid lobe noted, of doubtful significance given size. Upper chest: Moderate to advanced upper lobe predominant emphysema.  Visualized upper chest demonstrates no acute finding. Review of the MIP images confirms the above findings CTA HEAD FINDINGS Anterior circulation: Petrous segments patent bilaterally. Scattered atheromatous plaque within the cavernous/supraclinoid ICAs with secondary mild to moderate multifocal narrowing. No hemodynamically significant stenosis. ICA termini well perfused. A1 segments patent bilaterally. Right A1 hypoplastic, accounting for the slightly diminutive right ICA is compared to the left. Normal anterior communicating artery complex. Anterior cerebral arteries patent to their distal aspects without stenosis. Right M1 widely patent. Normal right MCA bifurcation. On coronal sequence, there appears to be focal occlusion of a proximal right M2 or possibly M3 branch at the base of the right sylvian fissure (series 12, image 88). a this is difficult to see on corresponding axial and sagittal sequence. Distal right MCA branches otherwise well perfused. Left M1 segment widely patent. Normal left MCA bifurcation. Distal left MCA branches well perfused and fairly symmetric with the right. Posterior circulation: Vertebral arteries widely patent to the vertebrobasilar junction without stenosis. Both PICA is patent bilaterally. 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 Basilar widely patent to its distal aspect. Superior cerebral arteries patent bilaterally. Both PCAs primarily supplied via the basilar and are well perfused to their distal aspects without stenosis. Venous sinuses: Grossly patent allowing for timing the contrast  bolus. Poor filling of the left sigmoid sinus and proximal internal jugular vein felt to be related to timing of the contrast bolus. No hyperdensity seen within this region on corresponding noncontrast head CT. Anatomic variants: Hypoplastic right A1 segment. No intracranial aneurysm. Review of the MIP images confirms the above findings IMPRESSION: 1. Focal occlusion of a proximal right M2/M3 branch at the base of the right sylvian fissure as detailed above, concerning for LVO. 2. 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 No significant intraluminal thrombus or intimal irregularity identified. 3. Mild to moderate atherosclerotic change about the aortic arch and carotid siphons without hemodynamically significant stenosis. 4.  Emphysema (ICD10-J43.9). Critical Value/emergent results were called by telephone at the time of interpretation on 12/09/2018 at 10:20 pm to providerDr. Rory Percy, Who verbally acknowledged these results. Electronically Signed   By: Jeannine Boga M.D.   On: 12/09/2018 22:38   Ct Head Wo Contrast  Result Date: 12/09/2018 CLINICAL DATA:  73 year old female with history of speech difficulty for the past day. EXAM: CT HEAD WITHOUT CONTRAST TECHNIQUE: Contiguous axial images were obtained from the base of the skull through the vertex without intravenous contrast. COMPARISON:  Head CT 11/12/2017. FINDINGS: Brain: In the inferior aspects of the frontal lobes of the brain bilaterally (right greater than left) there are poorly find areas of low attenuation and disruption of normal gray-white differentiation, which appear slightly more conspicuous than prior study 11/11/2017. Patchy and confluent areas of decreased attenuation are noted throughout the deep and periventricular white matter of the cerebral hemispheres bilaterally, compatible with chronic microvascular ischemic disease. No evidence of acute  infarction, hemorrhage, hydrocephalus, extra-axial collection or mass lesion/mass effect. Vascular: No hyperdense vessel or unexpected calcification. Skull: Normal. Negative for fracture or focal lesion. Sinuses/Orbits: No acute finding. Other: None. IMPRESSION: 1. Ill-defined areas of low attenuation in the frontal lobes bilaterally (right greater than left) with loss of gray-white differentiation, increased compared to prior study 11/11/2017. Areas of age-indeterminate ischemia in these regions are not excluded. If there is clinical concern for acute ischemia, further evaluation with brain MRI could be considered. 2. Extensive chronic  microvascular ischemic changes throughout the cerebral white matter redemonstrated. Critical Value/emergent results were called by telephone at the time of interpretation on 12/09/2018 at 8:06 pm to [place] , who verbally acknowledged these results. Electronically Signed   By: Vinnie Langton M.D.   On: 12/09/2018 20:06   Ct Angio Neck W And/or Wo Contrast  Result Date: 12/09/2018 CLINICAL DATA:  Initial evaluation for acute speech difficulty. Left-sided weakness. EXAM: CT ANGIOGRAPHY HEAD AND NECK TECHNIQUE: Multidetector CT imaging of the head and neck was performed using the standard protocol during bolus administration of intravenous contrast. Multiplanar CT image reconstructions and MIPs were obtained to evaluate the vascular anatomy. Carotid stenosis measurements (when applicable) are obtained utilizing NASCET criteria, using the distal internal carotid diameter as the denominator. CONTRAST:  108mL OMNIPAQUE IOHEXOL 350 MG/ML SOLN COMPARISON:  Prior head CT from earlier the same day. FINDINGS: CT HEAD FINDINGS Brain: Examination somewhat degraded by motion artifact. Age-related cerebral atrophy with moderate chronic microvascular ischemic disease. No acute intracranial hemorrhage. No acute large vessel territory infarct. No mass lesion, midline shift or mass  effect. No hydrocephalus. No extra-axial fluid collection. Vascular: No hyperdense vessel. Scattered vascular calcifications noted within the carotid siphons. Skull: Scalp soft tissues and calvarium within normal limits. Sinuses: Chronic left sphenoid sinusitis noted. Visualized paranasal sinuses are otherwise clear. No mastoid effusion. Orbits: Globes and orbital soft tissues demonstrate no acute finding. Review of the MIP images confirms the above findings CTA NECK FINDINGS Aortic arch: Visualized aortic arch normal in caliber with normal branch pattern. Moderate atherosclerotic change about the aortic arch and origin of the great vessels without hemodynamically significant stenosis. Visualized subclavian arteries widely patent. Right carotid system: Right CCA tortuous proximally but widely patent to the bifurcation without stenosis. No significant atheromatous narrowing about the right bifurcation. Focal pseudoaneurysm extending from the medial aspect of the mid-distal right ICA measures 7 x 4 x 6 mm (series 11, image 200). No significant intraluminal thrombus or intimal irregularity. Right ICA otherwise widely patent without stenosis or other acute abnormality. Left carotid system: Left CCA tortuous proximally but widely patent to the bifurcation without stenosis. No significant atheromatous narrowing about the left bifurcation. Left ICA widely patent distally to the skull base without stenosis, dissection, or occlusion. Vertebral arteries: Both vertebral arteries arise from the subclavian arteries. Mixed plaque at the origin of the left vertebral artery with associated stenosis of up to approximately 50%. Additional moderate multifocal areas of narrowing within the right V2 segment at the levels of C5 and C3, and on the left at C1 related to intrinsic compression from uncovertebral disease. Vertebral arteries otherwise patent without stenosis or vascular occlusion. Skeleton: No acute osseous abnormality. No  discrete lytic or blastic osseous lesions. Moderate multilevel cervical spondylosis at C4-5 through C6-7, with ankylosis of the C4 and C5 vertebral bodies. Prominent degenerative changes about the C1-2 articulation. Other neck: No other acute soft tissue abnormality within the neck. No mass lesion or adenopathy. Subcentimeter hypodense nodule within the inferior right thyroid lobe noted, of doubtful significance given size. Upper chest: Moderate to advanced upper lobe predominant emphysema. Visualized upper chest demonstrates no acute finding. Review of the MIP images confirms the above findings CTA HEAD FINDINGS Anterior circulation: Petrous segments patent bilaterally. Scattered atheromatous plaque within the cavernous/supraclinoid ICAs with secondary mild to moderate multifocal narrowing. No hemodynamically significant stenosis. ICA termini well perfused. A1 segments patent bilaterally. Right A1 hypoplastic, accounting for the slightly diminutive right ICA is compared to the left. Normal  anterior communicating artery complex. Anterior cerebral arteries patent to their distal aspects without stenosis. Right M1 widely patent. Normal right MCA bifurcation. On coronal sequence, there appears to be focal occlusion of a proximal right M2 or possibly M3 branch at the base of the right sylvian fissure (series 12, image 88). a this is difficult to see on corresponding axial and sagittal sequence. Distal right MCA branches otherwise well perfused. Left M1 segment widely patent. Normal left MCA bifurcation. Distal left MCA branches well perfused and fairly symmetric with the right. Posterior circulation: Vertebral arteries widely patent to the vertebrobasilar junction without stenosis. Both PICA is patent bilaterally. 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 Basilar widely patent to its distal aspect. Superior cerebral arteries patent bilaterally. Both PCAs primarily supplied via the basilar and are well  perfused to their distal aspects without stenosis. Venous sinuses: Grossly patent allowing for timing the contrast bolus. Poor filling of the left sigmoid sinus and proximal internal jugular vein felt to be related to timing of the contrast bolus. No hyperdensity seen within this region on corresponding noncontrast head CT. Anatomic variants: Hypoplastic right A1 segment. No intracranial aneurysm. Review of the MIP images confirms the above findings IMPRESSION: 1. Focal occlusion of a proximal right M2/M3 branch at the base of the right sylvian fissure as detailed above, concerning for LVO. 2. 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 No significant intraluminal thrombus or intimal irregularity identified. 3. Mild to moderate atherosclerotic change about the aortic arch and carotid siphons without hemodynamically significant stenosis. 4.  Emphysema (ICD10-J43.9). Critical Value/emergent results were called by telephone at the time of interpretation on 12/09/2018 at 10:20 pm to providerDr. Rory Percy, Who verbally acknowledged these results. Electronically Signed   By: Jeannine Boga M.D.   On: 12/09/2018 22:38   Ct Cerebral Perfusion W Contrast  Result Date: 12/09/2018 CLINICAL DATA:  Initial evaluation for acute aphasia, left-sided weakness. EXAM: CT PERFUSION BRAIN TECHNIQUE: Multiphase CT imaging of the brain was performed following IV bolus contrast injection. Subsequent parametric perfusion maps were calculated using RAPID software. CONTRAST:  38mL OMNIPAQUE IOHEXOL 350 MG/ML SOLN COMPARISON:  Prior CT and CTA from earlier same day. FINDINGS: CT Brain Perfusion Findings: CBF (<30%) Volume: 69mL Perfusion (Tmax>6.0s) volume: 41mL Mismatch Volume: 93mL ASPECTS on noncontrast CT Head: No aspects score provided. Infarct Core: 0 mL Infarction Location:Negative CT perfusion for acute core infarct or perfusion deficit. Upon further  review of prior CTA, previously noted possible right M2/M3 occlusion felt to most likely reflect focal vascular tortuosity with associated stenosis. Attenuated but patent flow is seen distally. IMPRESSION: 1. Negative CT perfusion for acute core infarct or perfusion abnormality. 2. Upon further consideration and in conjunction with these perfusion findings, previously questioned right M2/M3 occlusion felt to be most consistent with focal vascular tortuosity rather than occlusion. These results were communicated to Dr. Rory Percy at 10:52 pmon 10/18/2020by text page via the [facility] messaging system. Electronically Signed   By: Jeannine Boga M.D.   On: 12/09/2018 22:54     EKG: Independently reviewed.  Sinus rhythm, QTC 454, low voltage, poor R wave progression, anteroseptal infarction pattern  Assessment/Plan Principal Problem:   Seizure-like activity (HCC) Active Problems:   Hyponatremia   Alcohol abuse   Cigarette smoker   Hypertension   Malignant neoplasm of lower-inner quadrant of right breast of female, estrogen receptor positive (HCC)   GERD (gastroesophageal reflux disease)  Anxiety   Difficulty speaking   Leukocytosis   Seizure-like activity ([place]): Etiology is not clear.  Differential diagnosis includes seizure secondary to hyponatremia and possible alcohol withdrawal and stroke.  Neurology, Dr. Rory Percy was consulted.  He recommended correction of hyponatremia, MRI of her brain in the ED. "Can order Ativan as needed-only if these episodes last longer than 5 minutes.  At that time call neurology as well".  -will admit to SDU as inpt -Highly appreciate Dr. Rory Percy recommendations -MR brain without contrast - Routine EEG in the morning - per Dr. Rory Percy Do not correct more than 10 points in 24 hours. -seizure precautions  Hyponatremia: Most likely due to potomania 2/2 alcohol abuse, poor oral intake and dehydration. Pt was given 1L NS in ED, Na 121 -->125 in 3 hours. I will hold off NS  and restart NS at 75 cc at about 3:00 AM (by then, will be 8 hours since started NS treatment)  - Will check urine sodium, urine osmolality, serum osmolality. - check TSH - Fluid restriction - IVF: 1L NS in ED, will start IV normal saline at 75 mL/h at 3:00 AM - f/u by BMP q8h - avoid over correction too fast due to risk of central pontine myelinolysis  Tobacco abuse and Alcohol abuse: -Did counseling about importance of quitting smoking -Nicotine patch -Did counseling about the importance of quitting drinking -CIWA protocol  HTN:  -Continue home medications: Amlodipine, clonidine, metoprolol -IV hydralazine prn  Malignant neoplasm of lower-inner quadrant of right breast of female, estrogen receptor positive ([place]): -Continue letrozole  GERD (gastroesophageal reflux disease): -Protonix  Anxiety: -home Xanax  Leukocytosis: WBC 12.7. No fever or signs of infection, likely reactive. -Follow-up by CBC    Inpatient status:  # Patient requires inpatient status due to high intensity of service, high risk for further deterioration and high frequency of surveillance required.  I certify that at the point of admission it is my clinical judgment that the patient will require inpatient hospital care spanning beyond 2 midnights from the point of admission.  . This patient has multiple chronic comorbidities including hypertension, GERD, depression, anxiety, tardive dyskinesia, C1 cervical spine fracture, breast cancer (right lumpectomy), alcohol and tobacco abuse, hyponatremia, recent pubic bone fracture . Now patient has presenting symptoms include difficulty speaking, seizure-like activity, hyponatremia . The initial radiographic and laboratory data are worrisome because of hyponatremia, leukocytosis. CT angio head and neck with a questionable right M2 stenosis versus occlusion versus tortuous vessel artifact . Current medical needs: please see my assessment and plan . Predictability of  an adverse outcome (risk): Patient has multiple completely disease, now presents with difficulty speaking, seizure-like activity.  Etiology is not completely clear.  Patient has hyponatremia with sodium 121, which need to be corrected slowly to avoid  central pontine myelinolysis.  Her presentation is highly complicated.  Will need to be treated in the hospital for at least 2 days.         DVT ppx: SQ Lovenox Code Status: Partial code (I discussed with the patient, and explained the meaning of CODE STATUS, patient wants to be partial code, OK for CPR, but no intubation). Family Communication: Yes, patient's best friend at bed side Disposition Plan:  Anticipate discharge back to previous home environment Consults called:  Dr. Rory Percy of neuro Admission status:  Inpatient/tele      Date of Service 12/10/2018    [place] [facility]   If 7PM-7AM, please contact night-coverage [email or web address] Password [email or web address] 12/10/2018, 12:24 AM

## 2018-12-09 NOTE — Consult Note (Signed)
Neurology Consultation  Reason for Consult: AMS, spells of aphasia - transferred from [facility] ER Referring Physician: Dr. Darl Householder  CC: AMS, spells of aphasia and left sided weakness with complete resolution in brtween  History is obtained from: patient, chart,. EDP, family  HPI: ZAALIYAH OTTUM is a 73 y.o. female with a past medical history of right-sided breast cancer status post lumpectomy 1 year ago, anxiety, depression, hypertension, history of tardive dyskinesias, recent parasymphyseal pubic bone fracture secondary to mechanical fall on 11/24/2018, presenting to the emergency room at [facility] for concerns for episodes of inability to talk and or unresponsiveness.  She was at the [facility] long emergency room yesterday, for increasing pain and constipation. Her pain in the hip was worse and she also was constipated. She was evaluated by the ED providers and discharged home this morning at 4 AM. The home health care aide checked on her around 14 when she was fine and around 1130 had an episode where she could not bring her words out and stared into space-was unresponsive-not lost consciousness but not responding to questions.  She was brought in to [facility] after these episodes happen multiple times.  Admits to [employer], multiple such episodes were witnessed by the attending physician and the PA taking care of the patient. They discussed this case over the phone with me-at the time patient was back to her baseline and was not having any symptoms.  I recommended an emergent transfer to [facility] for an MRI to ensure that there is no strokelike activity going on.  Other considerations include seizures given an acute hyponatremia. The patient complained of starting having nausea and vomiting after taking 1 dose of Robaxin which was new medication since this Thursday.  She had only taken 1 dose on Thursday and felt that she was having extreme nausea and vomiting that day.  She  has not had nausea vomiting further.  She has been complaining of excessive urination. She is currently also on hydrocodone for pain  CT had done at [facility] was unremarkable. CT angio head and neck with a questionable right M2 stenosis versus occlusion versus tortuous vessel artifact.  En route to the hospital, she had an episode in the ambulance where she had left-sided gaze and was not responding with eyes open.  She had weakness on the left side.  She followed commands on the right at that time, according to the [facility] team.  In the emergency room she had an episode again while I was taking her to the CAT scanner where she stopped talking but was able to follow all commands.  Had no gaze deviation or preference, no facial asymmetry but just would not talk-detailed exam below.  Further history taking-friend at bedside says that she has had problems with her sodium levels 10 to 12 years ago when she required ICU admission for multiple days for correction of her sodium.   LKW: 11 AM on 12/09/2018 tpa given?: no, outside the window Premorbid modified Rankin scale (mRS): 4 due to the recent pelvic fracture  ROS: ROS was performed and is negative except as noted in the HPI.    Past Medical History:  Diagnosis Date  . Allergy   . Anemia 06/29/2011  . Anxiety   . Arthritis   . Breast cancer ([place])    right breast  . C1 cervical fracture ([place]) 02/1997  . Cancer ([place])    melanoma  . Depression   .  Family history of breast cancer   . Family history of prostate cancer   . GERD (gastroesophageal reflux disease)   . Hypertension   . Tardive dyskinesia      Family History  Problem Relation Age of Onset  . COPD Mother   . Prostate cancer Father 81       seed implant for treatment  . Colon polyps Father        'a few'  . Breast cancer Sister 34  . Breast cancer Maternal Aunt        dx >50  . Breast cancer Other 35       bilateral  . Colon cancer Neg Hx    Social  History:   reports that she has been smoking cigarettes. She has been smoking about 1.00 pack per day. She has never used smokeless tobacco. She reports current alcohol use of about 21.0 standard drinks of alcohol per week. She reports that she does not use drugs.  Medications No current facility-administered medications for this encounter.   Current Outpatient Medications:  .  ALPRAZolam (XANAX) 0.5 MG tablet, Take 0.5 mg by mouth 2 (two) times daily as needed. For anxiety, Disp: , Rfl:  .  amLODipine (NORVASC) 5 MG tablet, Take 5 mg by mouth daily., Disp: , Rfl:  .  cetirizine (ZYRTEC) 10 MG tablet, Take 10 mg by mouth as needed for allergies., Disp: , Rfl:  .  cloNIDine (CATAPRES) 0.1 MG tablet, Take 1 tablet by mouth 2 (two) times daily., Disp: , Rfl: 0 .  desvenlafaxine (PRISTIQ) 50 MG 24 hr tablet, Take 50 mg by mouth daily., Disp: , Rfl:  .  HYDROcodone-acetaminophen (NORCO/VICODIN) 5-325 MG tablet, Take 1-2 tablets by mouth every 6 (six) hours as needed for severe pain. (Patient not taking: Reported on 12/08/2018), Disp: 15 tablet, Rfl: 0 .  HYDROcodone-acetaminophen (NORCO/VICODIN) 5-325 MG tablet, Take 1 tablet by mouth every 6 (six) hours as needed for moderate pain., Disp: 30 tablet, Rfl: 0 .  letrozole (FEMARA) 2.5 MG tablet, TAKE 1 TABLET(2.5 MG) BY MOUTH DAILY (Patient taking differently: Take 2.5 mg by mouth daily. ), Disp: 90 tablet, Rfl: 0 .  methocarbamol (ROBAXIN) 500 MG tablet, Take 1 tablet TID prn (Patient not taking: Reported on 12/08/2018), Disp: 30 tablet, Rfl: 0 .  methylphenidate (RITALIN) 20 MG tablet, Take 20 mg by mouth daily. , Disp: , Rfl:  .  metoprolol succinate (TOPROL-XL) 100 MG 24 hr tablet, Take 100 mg by mouth daily. , Disp: , Rfl:  .  omeprazole (PRILOSEC) 40 MG capsule, TAKE 1 CAPSULE BY MOUTH TWICE DAILY 30 MINUTES BEFORE BREAKFAST AND 30 MINUTES BEFORE DINNER (Patient taking differently: Take 40 mg by mouth 2 (two) times daily. 30 minutes before  breakfast, and 30  minutes before dinner.), Disp: 180 capsule, Rfl: 0 .  oxybutynin (DITROPAN-XL) 10 MG 24 hr tablet, Take 10 mg by mouth daily. , Disp: , Rfl: 0 .  polyethylene glycol (MIRALAX / GLYCOLAX) 17 g packet, Take 17 g by mouth daily as needed for moderate constipation., Disp: 14 each, Rfl: 0 .  promethazine (PHENERGAN) 25 MG tablet, Take 25 mg by mouth every 6 (six) hours as needed for nausea or vomiting., Disp: , Rfl:   Exam: Current vital signs: BP (!) 160/88   Pulse (!) 102   Temp 98.7 F (37.1 C) (Oral)   Resp 16   Ht 5\' 3"  (1.6 m)   Wt 68 kg   SpO2 96%   BMI  26.57 kg/m  Vital signs in last 24 hours: Temp:  [97.9 F (36.6 C)-98.7 F (37.1 C)] 98.7 F (37.1 C) (10/18 1816) Pulse Rate:  [79-118] 102 (10/18 2129) Resp:  [16-23] 16 (10/18 2056) BP: (157-174)/(76-97) 160/88 (10/18 2127) SpO2:  [92 %-99 %] 96 % (10/18 2129) Weight:  [68 kg] 68 kg (10/18 1814) General: Awake alert in no distress HEENT: Normocephalic atraumatic Lungs: Clear to auscultation Cardiovascular: Regular rate rhythm Abdomen: Nondistended nontender Extremities: Pain on the right leg on raising it above the bed.  Otherwise unremarkable exam Neurological exam Initial exam-awake alert oriented x3.  No dysarthria.  Naming repetition comprehension intact.  Normal attention concentration. Cranial nerves: 2-12 intact Motor exam: Antigravity 5/5 without drift in all fours Sensory exam: Intact to light touch all over Coordination: No dysmetria NIH stroke scale-0  In the CT scanner, repeat exam: -Awake, appears alert but did not talk.  She was able to follow all commands.  Cranial nerves II to XII grossly normal.  No focal weakness.  No sensory loss.  No dysmetria.   Labs I have reviewed labs in epic and the results pertinent to this consultation are:  CBC    Component Value Date/Time   WBC 12.7 (H) 12/09/2018 1910   RBC 3.95 12/09/2018 1910   HGB 13.1 12/09/2018 1910   HGB 12.9 10/25/2017  1230   HCT 37.4 12/09/2018 1910   PLT 553 (H) 12/09/2018 1910   PLT 290 10/25/2017 1230   MCV 94.7 12/09/2018 1910   MCH 33.2 12/09/2018 1910   MCHC 35.0 12/09/2018 1910   RDW 12.0 12/09/2018 1910   LYMPHSABS 2.0 12/09/2018 1910   MONOABS 1.6 (H) 12/09/2018 1910   EOSABS 0.1 12/09/2018 1910   BASOSABS 0.1 12/09/2018 1910    CMP     Component Value Date/Time   NA 121 (L) 12/09/2018 1910   K 3.7 12/09/2018 1910   CL 87 (L) 12/09/2018 1910   CO2 22 12/09/2018 1910   GLUCOSE 122 (H) 12/09/2018 1910   BUN 12 12/09/2018 1910   CREATININE 0.60 12/09/2018 1910   CREATININE 0.84 10/25/2017 1230   CALCIUM 9.1 12/09/2018 1910   PROT 7.4 12/09/2018 1910   ALBUMIN 3.8 12/09/2018 1910   AST 24 12/09/2018 1910   AST 17 10/25/2017 1230   ALT 19 12/09/2018 1910   ALT 10 10/25/2017 1230   ALKPHOS 146 (H) 12/09/2018 1910   BILITOT 0.7 12/09/2018 1910   BILITOT 0.4 10/25/2017 1230   GFRNONAA >60 12/09/2018 1910   GFRNONAA >60 10/25/2017 1230   GFRAA >60 12/09/2018 1910   GFRAA >60 10/25/2017 1230    Imaging I have reviewed the images obtained: CT head, CTA head and neck and CT perfusion study essentially unremarkable for acute stroke or large vessel occlusion. CTA head and neck initially with question of right M2 occlusion versus artifact from a tortuous vessel, but after the CT perfusion was done which was completely unremarkable, I discussed with the neuroradiologist who thought that the previously thought right M2 occlusion was just artifactual.   Assessment: 73 year old woman past medical history of right-sided breast cancer status post lumpectomy 1 year ago, anxiety, depression, hypertension, history of tardive dyskinesia, recent right pubic fracture secondary to mechanical fall in early October presented to the emergency room for episodes of inability to talk and become unresponsive without loss of consciousness. I witnessed 1 of these episodes myself where she had her eyes open and  followed all commands but did not talk. Her labs  reveal marked hyponatremia-sodium 121. At this time, differentials include possible seizure activity secondary to hyponatremia versus lowering of seizure threshold with medication such as opiates and Robaxin although her last dose of Robaxin was 3 days ago. Other differentials to consider given an extensive history of anxiety and depression is a behavioral cause for such symptoms. Until work-up to rule out a structural and toxic metabolic derangements is not completed, it would be unfair to label this as a behavioral episode   Impression: Evaluate for stereotypical episodes of impaired consciousness-evaluate for seizures Evaluate for toxic metabolic encephalopathy-concern for polypharmacy with pain medications and muscle relaxants Evaluate for possible behavioral causes of unresponsiveness Less likely stroke but will need evaluation by MRI  Recommendations: MR brain without contrast Routine EEG in the morning Correction of hyponatremia-we will recommend admission to medicine for slow and gradual correction of the hyponatremia.  Do not correct more than 10 points in 24 hours. Maintain seizure precautions Can order Ativan as needed-only if these episodes last longer than 5 minutes.  At that time call neurology as well. Do not see a need for starting her on antiepileptics for right now as this all might be provoked due to the multiple medications and hyponatremia.  Discussed my plan with Dr. Darl Householder, patient and her friend at bedside in ER.  -- Amie Portland, MD [facility] Pager: [phone] If 7pm to 7am, please call on call as listed on AMION.  CRITICAL CARE ATTESTATION Performed by: Amie Portland, MD Total critical care time: 40 minutes Critical care time was exclusive of separately billable procedures and treating other patients and/or supervising APPs/Residents/Students Critical care was necessary to treat or prevent imminent  or life-threatening deterioration due to concern for seizure, concern for stroke, toxic metabolic encephalopathy, hyponatremia This patient is critically ill and at significant risk for neurological worsening and/or death and care requires constant monitoring. Critical care was time spent personally by me on the following activities: development of treatment plan with patient and/or surrogate as well as nursing, discussions with consultants, evaluation of patient's response to treatment, examination of patient, obtaining history from patient or surrogate, ordering and performing treatments and interventions, ordering and review of laboratory studies, ordering and review of radiographic studies, pulse oximetry, re-evaluation of patient's condition, participation in multidisciplinary rounds and medical decision making of high complexity in the care of this patient.

## 2018-12-09 NOTE — Discharge Instructions (Signed)
Please take 6 capfuls of MiraLAX in a 32 oz bottle of Gatorade over 2-4 hour period. The following day take 3 capfuls. On day 3 start taking 1 capful 3 times a day. Slowly cut back as needed until you have normal bowel movements. 

## 2018-12-09 NOTE — ED Provider Notes (Signed)
  Physical Exam  BP (!) 161/89   Pulse 96   Temp 98.7 F (37.1 C) (Oral)   Resp 15   Ht 5\' 3"  (1.6 m)   Wt 68 kg   SpO2 94%   BMI 26.57 kg/m   Physical Exam  ED Course/Procedures     Procedures  MDM  Patient care was assumed when patient was transferred from [facility].  Patient apparently had another seizure-like activity in route and apparently she was staring to the left and was unable to speak but was protecting her airway.  CTA was performed but results were pending. I notify Dr. Malen Gauze on arrival.  He thinks that patient may have an LVO versus hyponatremia causing seizure.  On my initial exam, patient is alert and awake and has nonfocal neuro exam and has no eye deviation. CT perfusion showed no LVO. Dr. Rory Percy felt that she likely had seizure from hyponatremia. Ordered serum and urine osms but patient received IVF already. He doesn't recommend 3% NS as patient is back to baseline. Hospitalist to admit.   CRITICAL CARE Performed by: Wandra Arthurs   Total critical care time: 30 minutes  Critical care time was exclusive of separately billable procedures and treating other patients.  Critical care was necessary to treat or prevent imminent or life-threatening deterioration.  Critical care was time spent personally by me on the following activities: development of treatment plan with patient and/or surrogate as well as nursing, discussions with consultants, evaluation of patient's response to treatment, examination of patient, obtaining history from patient or surrogate, ordering and performing treatments and interventions, ordering and review of laboratory studies, ordering and review of radiographic studies, pulse oximetry and re-evaluation of patient's condition.       Drenda Freeze, MD 12/09/18 [phone]

## 2018-12-09 NOTE — ED Provider Notes (Signed)
[facility] EMERGENCY DEPARTMENT Provider Note   CSN: [id number] Arrival date & time: 12/09/18  1808     History   Chief Complaint Chief Complaint  Patient presents with  . Altered Mental Status    HPI Mindy Young is a 73 y.o. female past medical history anemia, anxiety, depression, recent right hip fracture who presents for evaluation of intermittent aphasia.  Sister is at bedside who helps provide additional history.  She reports that about 1130, patient's home health nurse noted that patient was having an episode where she was having difficulty completing her sentences and felt like she was stuttering.  This lasted for about 15 minutes before resolving.  She had 3 additional episodes one witnessed by her sister for coming to the ED.  Patient states she feels like she knows what she wants to say but cannot get the words out.  Sister did not notice any facial drooping, slurred speech.  No new history of trauma, falls, head injuries.  Patient denies any vision changes, numbness/weakness of her arms or legs.  She reports that she is having some trouble breathing when she came to the ED but none prior.  Patient states that she remembers some of the events.  She states that most the time she remembers the tail end of what is happening as she is coming back out of them.  Family stated they did not notice any shaking activity.  She has not been sick recently with any fevers, chest pain, abdominal pain, swelling of her legs       The history is provided by the patient.    Past Medical History:  Diagnosis Date  . Allergy   . Anemia 06/29/2011  . Anxiety   . Arthritis   . Breast cancer ([place])    right breast  . C1 cervical fracture ([place]) 02/1997  . Cancer ([place])    melanoma  . Depression   . Family history of breast cancer   . Family history of prostate cancer   . GERD (gastroesophageal reflux disease)   . Hypertension   . Tardive dyskinesia     Patient Active Problem List    Diagnosis Date Noted  . GERD (gastroesophageal reflux disease) 12/09/2018  . Anxiety 12/09/2018  . Difficulty speaking 12/09/2018  . Leukocytosis 12/09/2018  . Seizure-like activity ([place]) 12/09/2018  . Pelvic fracture ([place]) 11/25/2018  . Pubic bone fracture ([place]) 11/24/2018  . Left knee pain 03/29/2018  . Unilateral primary osteoarthritis, left knee 03/29/2018  . Genetic testing 01/24/2018  . Family history of breast cancer   . Family history of prostate cancer   . Malignant neoplasm of lower-inner quadrant of right breast of female, estrogen receptor positive ([place]) 10/24/2017  . Anemia 06/29/2011  . Hypertension   . Hyponatremia 06/25/2011  . Transaminitis 06/25/2011  . Alcohol abuse 06/25/2011  . Cough 06/25/2011  . Cigarette smoker 06/25/2011    Past Surgical History:  Procedure Laterality Date  . ABDOMINAL HYSTERECTOMY    . ASPIRATION OF ABSCESS Right 11/20/2017   Procedure: ASPIRATION OF RIGHT AXILLARY SEROMA;  Surgeon: Rolm Bookbinder, MD;  Location: [facility];  Service: General;  Laterality: Right;  . AUGMENTATION MAMMAPLASTY Bilateral    biateral implants , approx 2015  . BREAST LUMPECTOMY Right 11/02/2017   re-ex 11-20-17  . BREAST LUMPECTOMY WITH RADIOACTIVE SEED AND SENTINEL LYMPH NODE BIOPSY Right 11/02/2017   Procedure: BREAST LUMPECTOMY WITH RADIOACTIVE SEED AND SENTINEL LYMPH NODE BIOPSY;  Surgeon: Rolm Bookbinder, MD;  Location: [facility];  Service: General;  Laterality: Right;  . CHOLECYSTECTOMY    . collarbone    . INCONTINENCE SURGERY    . KNEE SURGERY     removal of cyst, repair of cartiledge  . LAPAROSCOPY     for endometriosis  . RE-EXCISION OF BREAST LUMPECTOMY Right 11/20/2017   Procedure: RE-EXCISION OF RIGHT BREAST MARGINS;  Surgeon: Rolm Bookbinder, MD;  Location: [facility];  Service: General;  Laterality: Right;  . ROTATOR CUFF REPAIR     left     OB History   No obstetric history on  file.      Home Medications    Prior to Admission medications   Medication Sig Start Date End Date Taking? Authorizing Provider  ALPRAZolam Duanne Moron) 0.5 MG tablet Take 0.5 mg by mouth 2 (two) times daily as needed for anxiety.  08/10/10  Yes [provider]  amLODipine (NORVASC) 5 MG tablet Take 5 mg by mouth daily.   Yes [provider]  cetirizine (ZYRTEC) 10 MG tablet Take 10 mg by mouth as needed for allergies.   Yes [provider]  cloNIDine (CATAPRES) 0.1 MG tablet Take 1 tablet by mouth 2 (two) times daily. 11/07/16  Yes [provider]  desvenlafaxine (PRISTIQ) 50 MG 24 hr tablet Take 50 mg by mouth daily.   Yes [provider]  HYDROcodone-acetaminophen (NORCO/VICODIN) 5-325 MG tablet Take 1 tablet by mouth every 6 (six) hours as needed for moderate pain. 12/06/18  Yes Garald Balding, MD  letrozole [facility]) 2.5 MG tablet TAKE 1 TABLET(2.5 MG) BY MOUTH DAILY Patient taking differently: Take 2.5 mg by mouth daily.  10/12/18  Yes Nicholas Lose, MD  methylphenidate (RITALIN) 20 MG tablet Take 20 mg by mouth daily.    Yes [provider]  metoprolol succinate (TOPROL-XL) 100 MG 24 hr tablet Take 100 mg by mouth daily.  02/19/18  Yes [provider]  omeprazole (PRILOSEC) 40 MG capsule TAKE 1 CAPSULE BY MOUTH TWICE DAILY 30 MINUTES BEFORE BREAKFAST AND 30 MINUTES BEFORE DINNER Patient taking differently: Take 40 mg by mouth 2 (two) times daily. 30 minutes before breakfast, and 30  minutes before dinner. 10/12/18  Yes Nandigam, Venia Minks, MD  oxybutynin (DITROPAN-XL) 10 MG 24 hr tablet Take 10 mg by mouth daily.  12/05/16  Yes [provider]  polyethylene glycol (MIRALAX / GLYCOLAX) 17 g packet Take 17 g by mouth daily as needed for moderate constipation. 11/26/18  Yes Oswald Hillock, MD  promethazine (PHENERGAN) 25 MG tablet Take 25 mg by mouth every 6 (six) hours as needed for nausea or vomiting.   Yes [provider]  HYDROcodone-acetaminophen (NORCO/VICODIN) 5-325 MG tablet Take 1-2 tablets by mouth every 6 (six) hours as needed for severe pain. Patient not taking: Reported on 12/08/2018 11/29/18   Cherylann Ratel, PA-C  methocarbamol (ROBAXIN) 500 MG tablet Take 1 tablet TID prn Patient not taking: Reported on 12/08/2018 12/06/18   Garald Balding, MD    Family History Family History  Problem Relation Age of Onset  . COPD Mother   . Prostate cancer Father 74       seed implant for treatment  . Colon polyps Father        'a few'  . Breast cancer Sister 22  . Breast cancer Maternal Aunt        dx >50  . Breast cancer Other 35  bilateral  . Colon cancer Neg Hx     Social History Social History   Tobacco Use  . Smoking status: Current Every Day Smoker    Packs/day: 1.00    Types: Cigarettes  . Smokeless tobacco: Never Used  . Tobacco comment: e-cigs  Substance Use Topics  . Alcohol use: Yes    Alcohol/week: 21.0 standard drinks    Types: 21 Standard drinks or equivalent per week    Comment: 2 per weekend day; denies during the week (not consistent with prior hx)  . Drug use: No     Allergies   Percocet [oxycodone-acetaminophen]   Review of Systems Review of Systems  Constitutional: Negative for fever.  Eyes: Negative for visual disturbance.  Respiratory: Negative for cough and shortness of breath.   Cardiovascular: Negative for chest pain.  Gastrointestinal: Negative for abdominal pain, nausea and vomiting.  Genitourinary: Negative for dysuria and hematuria.  Neurological: Positive for speech difficulty. Negative for weakness, numbness and headaches.  All other systems reviewed and are negative.    Physical Exam Updated Vital Signs BP (!) 153/95   Pulse (!) 122   Temp 98.7 F (37.1 C) (Oral)   Resp 18   Ht 5\' 3"  (1.6 m)   Wt 68 kg   SpO2 96%   BMI 26.57 kg/m   Physical Exam Vitals signs and nursing note reviewed.  Constitutional:       Appearance: Normal appearance. She is well-developed.  HENT:     Head: Normocephalic and atraumatic.  Eyes:     General: Lids are normal.     Conjunctiva/sclera: Conjunctivae normal.     Pupils: Pupils are equal, round, and reactive to light.     Comments: PERRL. EOMs intact. No nystagmus. No neglect.   Neck:     Musculoskeletal: Full passive range of motion without pain.  Cardiovascular:     Rate and Rhythm: Normal rate and regular rhythm.     Pulses: Normal pulses.          Radial pulses are 2+ on the right side and 2+ on the left side.     Heart sounds: Normal heart sounds. No murmur. No friction rub. No gallop.   Pulmonary:     Effort: Pulmonary effort is normal.     Breath sounds: Normal breath sounds.     Comments: Lungs clear to auscultation bilaterally.  Symmetric chest rise.  No wheezing, rales, rhonchi.  Abdominal:     Palpations: Abdomen is soft. Abdomen is not rigid.     Tenderness: There is no abdominal tenderness. There is no guarding.     Comments: Abdomen is soft, non-distended, non-tender. No rigidity, No guarding. No peritoneal signs.  Musculoskeletal: Normal range of motion.  Skin:    General: Skin is warm and dry.     Capillary Refill: Capillary refill takes less than 2 seconds.  Neurological:     Mental Status: She is alert and oriented to person, place, and time.     Comments: Upper extremity and left lower extremity.  Limited strength of right lower extremity secondary to pre-existing pelvic injury.  No pronator drift.  No slurred speech.  No facial droop. When she does not have any episodes of symptoms, cranial nerves II through XII are intact.  She did have an episode where I was examining her.  She was able to complete all activities except smile, stick out her tongue.   Psychiatric:        Speech: Speech normal.  ED Treatments / Results  Labs (all labs ordered are listed, but only abnormal results are displayed) Labs Reviewed  URINALYSIS,  ROUTINE W REFLEX MICROSCOPIC - Abnormal; Notable for the following components:      Result Value   Specific Gravity, Urine <1.005 (*)    Glucose, UA 100 (*)    All other components within normal limits  COMPREHENSIVE METABOLIC PANEL - Abnormal; Notable for the following components:   Sodium 121 (*)    Chloride 87 (*)    Glucose, Bld 122 (*)    Alkaline Phosphatase 146 (*)    All other components within normal limits  CBC WITH DIFFERENTIAL/PLATELET - Abnormal; Notable for the following components:   WBC 12.7 (*)    Platelets 553 (*)    Neutro Abs 9.0 (*)    Monocytes Absolute 1.6 (*)    Abs Immature Granulocytes 0.08 (*)    All other components within normal limits  BASIC METABOLIC PANEL - Abnormal; Notable for the following components:   Sodium 125 (*)    Chloride 92 (*)    CO2 20 (*)    Glucose, Bld 106 (*)    Calcium 8.8 (*)    All other components within normal limits  SARS CORONAVIRUS 2 (TAT 6-24 HRS)  PROTIME-INR  APTT  ETHANOL  OSMOLALITY  OSMOLALITY, URINE  BASIC METABOLIC PANEL  SODIUM, URINE, RANDOM  BASIC METABOLIC PANEL  BASIC METABOLIC PANEL  PHOSPHORUS  MAGNESIUM  TSH  HEMOGLOBIN A1C  LIPID PANEL  TROPONIN I (HIGH SENSITIVITY)  TROPONIN I (HIGH SENSITIVITY)    EKG EKG Interpretation  Date/Time:  Sunday December 09 2018 19:17:42 EDT Ventricular Rate:  97 PR Interval:    QRS Duration: 90 QT Interval:  357 QTC Calculation: 454 R Axis:   87 Text Interpretation:  Sinus rhythm Borderline right axis deviation Baseline wander in lead(s) V6 since last tracing no significant change Confirmed by Malvin Johns [phone]) on 12/09/2018 7:30:44 PM   Radiology Ct Angio Head W Or Wo Contrast  Result Date: 12/09/2018 CLINICAL DATA:  Initial evaluation for acute speech difficulty. Left-sided weakness. EXAM: CT ANGIOGRAPHY HEAD AND NECK TECHNIQUE: Multidetector CT imaging of the head and neck was performed using the standard protocol during bolus administration of  intravenous contrast. Multiplanar CT image reconstructions and MIPs were obtained to evaluate the vascular anatomy. Carotid stenosis measurements (when applicable) are obtained utilizing NASCET criteria, using the distal internal carotid diameter as the denominator. CONTRAST:  54mL OMNIPAQUE IOHEXOL 350 MG/ML SOLN COMPARISON:  Prior head CT from earlier the same day. FINDINGS: CT HEAD FINDINGS Brain: Examination somewhat degraded by motion artifact. Age-related cerebral atrophy with moderate chronic microvascular ischemic disease. No acute intracranial hemorrhage. No acute large vessel territory infarct. No mass lesion, midline shift or mass effect. No hydrocephalus. No extra-axial fluid collection. Vascular: No hyperdense vessel. Scattered vascular calcifications noted within the carotid siphons. Skull: Scalp soft tissues and calvarium within normal limits. Sinuses: Chronic left sphenoid sinusitis noted. Visualized paranasal sinuses are otherwise clear. No mastoid effusion. Orbits: Globes and orbital soft tissues demonstrate no acute finding. Review of the MIP images confirms the above findings CTA NECK FINDINGS Aortic arch: Visualized aortic arch normal in caliber with normal branch pattern. Moderate atherosclerotic change about the aortic arch and origin of the great vessels without hemodynamically significant stenosis. Visualized subclavian arteries widely patent. Right carotid system: Right CCA tortuous proximally but widely patent to the bifurcation without stenosis. No significant atheromatous narrowing about the right bifurcation. Focal pseudoaneurysm extending  from the medial aspect of the mid-distal right ICA measures 7 x 4 x 6 mm (series 11, image 200). No significant intraluminal thrombus or intimal irregularity. Right ICA otherwise widely patent without stenosis or other acute abnormality. Left carotid system: Left CCA tortuous proximally but widely patent to the bifurcation without stenosis. No  significant atheromatous narrowing about the left bifurcation. Left ICA widely patent distally to the skull base without stenosis, dissection, or occlusion. Vertebral arteries: Both vertebral arteries arise from the subclavian arteries. Mixed plaque at the origin of the left vertebral artery with associated stenosis of up to approximately 50%. Additional moderate multifocal areas of narrowing within the right V2 segment at the levels of C5 and C3, and on the left at C1 related to intrinsic compression from uncovertebral disease. Vertebral arteries otherwise patent without stenosis or vascular occlusion. Skeleton: No acute osseous abnormality. No discrete lytic or blastic osseous lesions. Moderate multilevel cervical spondylosis at C4-5 through C6-7, with ankylosis of the C4 and C5 vertebral bodies. Prominent degenerative changes about the C1-2 articulation. Other neck: No other acute soft tissue abnormality within the neck. No mass lesion or adenopathy. Subcentimeter hypodense nodule within the inferior right thyroid lobe noted, of doubtful significance given size. Upper chest: Moderate to advanced upper lobe predominant emphysema. Visualized upper chest demonstrates no acute finding. Review of the MIP images confirms the above findings CTA HEAD FINDINGS Anterior circulation: Petrous segments patent bilaterally. Scattered atheromatous plaque within the cavernous/supraclinoid ICAs with secondary mild to moderate multifocal narrowing. No hemodynamically significant stenosis. ICA termini well perfused. A1 segments patent bilaterally. Right A1 hypoplastic, accounting for the slightly diminutive right ICA is compared to the left. Normal anterior communicating artery complex. Anterior cerebral arteries patent to their distal aspects without stenosis. Right M1 widely patent. Normal right MCA bifurcation. On coronal sequence, there appears to be focal occlusion of a proximal right M2 or possibly M3 branch at the base of the  right sylvian fissure (series 12, image 88). a this is difficult to see on corresponding axial and sagittal sequence. Distal right MCA branches otherwise well perfused. Left M1 segment widely patent. Normal left MCA bifurcation. Distal left MCA branches well perfused and fairly symmetric with the right. Posterior circulation: Vertebral arteries widely patent to the vertebrobasilar junction without stenosis. Both PICA is patent bilaterally. 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 Basilar widely patent to its distal aspect. Superior cerebral arteries patent bilaterally. Both PCAs primarily supplied via the basilar and are well perfused to their distal aspects without stenosis. Venous sinuses: Grossly patent allowing for timing the contrast bolus. Poor filling of the left sigmoid sinus and proximal internal jugular vein felt to be related to timing of the contrast bolus. No hyperdensity seen within this region on corresponding noncontrast head CT. Anatomic variants: Hypoplastic right A1 segment. No intracranial aneurysm. Review of the MIP images confirms the above findings IMPRESSION: 1. Focal occlusion of a proximal right M2/M3 branch at the base of the right sylvian fissure as detailed above, concerning for LVO. 2. 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 No significant intraluminal thrombus or intimal irregularity identified. 3. Mild to moderate atherosclerotic change about the aortic arch and carotid siphons without hemodynamically significant stenosis. 4.  Emphysema (ICD10-J43.9). Critical Value/emergent results were called by telephone at the time of interpretation on 12/09/2018 at 10:20 pm to providerDr. Rory Percy, Who verbally acknowledged these results. Electronically Signed   By:  Jeannine Boga M.D.   On: 12/09/2018 22:38   Ct Head Wo Contrast  Result Date: 12/09/2018 CLINICAL DATA:  73 year old female  with history of speech difficulty for the past day. EXAM: CT HEAD WITHOUT CONTRAST TECHNIQUE: Contiguous axial images were obtained from the base of the skull through the vertex without intravenous contrast. COMPARISON:  Head CT 11/12/2017. FINDINGS: Brain: In the inferior aspects of the frontal lobes of the brain bilaterally (right greater than left) there are poorly find areas of low attenuation and disruption of normal gray-white differentiation, which appear slightly more conspicuous than prior study 11/11/2017. Patchy and confluent areas of decreased attenuation are noted throughout the deep and periventricular white matter of the cerebral hemispheres bilaterally, compatible with chronic microvascular ischemic disease. No evidence of acute infarction, hemorrhage, hydrocephalus, extra-axial collection or mass lesion/mass effect. Vascular: No hyperdense vessel or unexpected calcification. Skull: Normal. Negative for fracture or focal lesion. Sinuses/Orbits: No acute finding. Other: None. IMPRESSION: 1. Ill-defined areas of low attenuation in the frontal lobes bilaterally (right greater than left) with loss of gray-white differentiation, increased compared to prior study 11/11/2017. Areas of age-indeterminate ischemia in these regions are not excluded. If there is clinical concern for acute ischemia, further evaluation with brain MRI could be considered. 2. Extensive chronic microvascular ischemic changes throughout the cerebral white matter redemonstrated. Critical Value/emergent results were called by telephone at the time of interpretation on 12/09/2018 at 8:06 pm to [place] , who verbally acknowledged these results. Electronically Signed   By: Vinnie Langton M.D.   On: 12/09/2018 20:06   Ct Angio Neck W And/or Wo Contrast  Result Date: 12/09/2018 CLINICAL DATA:  Initial evaluation for acute speech difficulty. Left-sided weakness. EXAM: CT ANGIOGRAPHY HEAD AND NECK TECHNIQUE: Multidetector  CT imaging of the head and neck was performed using the standard protocol during bolus administration of intravenous contrast. Multiplanar CT image reconstructions and MIPs were obtained to evaluate the vascular anatomy. Carotid stenosis measurements (when applicable) are obtained utilizing NASCET criteria, using the distal internal carotid diameter as the denominator. CONTRAST:  38mL OMNIPAQUE IOHEXOL 350 MG/ML SOLN COMPARISON:  Prior head CT from earlier the same day. FINDINGS: CT HEAD FINDINGS Brain: Examination somewhat degraded by motion artifact. Age-related cerebral atrophy with moderate chronic microvascular ischemic disease. No acute intracranial hemorrhage. No acute large vessel territory infarct. No mass lesion, midline shift or mass effect. No hydrocephalus. No extra-axial fluid collection. Vascular: No hyperdense vessel. Scattered vascular calcifications noted within the carotid siphons. Skull: Scalp soft tissues and calvarium within normal limits. Sinuses: Chronic left sphenoid sinusitis noted. Visualized paranasal sinuses are otherwise clear. No mastoid effusion. Orbits: Globes and orbital soft tissues demonstrate no acute finding. Review of the MIP images confirms the above findings CTA NECK FINDINGS Aortic arch: Visualized aortic arch normal in caliber with normal branch pattern. Moderate atherosclerotic change about the aortic arch and origin of the great vessels without hemodynamically significant stenosis. Visualized subclavian arteries widely patent. Right carotid system: Right CCA tortuous proximally but widely patent to the bifurcation without stenosis. No significant atheromatous narrowing about the right bifurcation. Focal pseudoaneurysm extending from the medial aspect of the mid-distal right ICA measures 7 x 4 x 6 mm (series 11, image 200). No significant intraluminal thrombus or intimal irregularity. Right ICA otherwise widely patent without stenosis or other acute abnormality. Left  carotid system: Left CCA tortuous proximally but widely patent to the bifurcation without stenosis. No significant atheromatous narrowing about the left bifurcation. Left ICA widely patent distally  to the skull base without stenosis, dissection, or occlusion. Vertebral arteries: Both vertebral arteries arise from the subclavian arteries. Mixed plaque at the origin of the left vertebral artery with associated stenosis of up to approximately 50%. Additional moderate multifocal areas of narrowing within the right V2 segment at the levels of C5 and C3, and on the left at C1 related to intrinsic compression from uncovertebral disease. Vertebral arteries otherwise patent without stenosis or vascular occlusion. Skeleton: No acute osseous abnormality. No discrete lytic or blastic osseous lesions. Moderate multilevel cervical spondylosis at C4-5 through C6-7, with ankylosis of the C4 and C5 vertebral bodies. Prominent degenerative changes about the C1-2 articulation. Other neck: No other acute soft tissue abnormality within the neck. No mass lesion or adenopathy. Subcentimeter hypodense nodule within the inferior right thyroid lobe noted, of doubtful significance given size. Upper chest: Moderate to advanced upper lobe predominant emphysema. Visualized upper chest demonstrates no acute finding. Review of the MIP images confirms the above findings CTA HEAD FINDINGS Anterior circulation: Petrous segments patent bilaterally. Scattered atheromatous plaque within the cavernous/supraclinoid ICAs with secondary mild to moderate multifocal narrowing. No hemodynamically significant stenosis. ICA termini well perfused. A1 segments patent bilaterally. Right A1 hypoplastic, accounting for the slightly diminutive right ICA is compared to the left. Normal anterior communicating artery complex. Anterior cerebral arteries patent to their distal aspects without stenosis. Right M1 widely patent. Normal right MCA bifurcation. On coronal  sequence, there appears to be focal occlusion of a proximal right M2 or possibly M3 branch at the base of the right sylvian fissure (series 12, image 88). a this is difficult to see on corresponding axial and sagittal sequence. Distal right MCA branches otherwise well perfused. Left M1 segment widely patent. Normal left MCA bifurcation. Distal left MCA branches well perfused and fairly symmetric with the right. Posterior circulation: Vertebral arteries widely patent to the vertebrobasilar junction without stenosis. Both PICA is patent bilaterally. 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 Basilar widely patent to its distal aspect. Superior cerebral arteries patent bilaterally. Both PCAs primarily supplied via the basilar and are well perfused to their distal aspects without stenosis. Venous sinuses: Grossly patent allowing for timing the contrast bolus. Poor filling of the left sigmoid sinus and proximal internal jugular vein felt to be related to timing of the contrast bolus. No hyperdensity seen within this region on corresponding noncontrast head CT. Anatomic variants: Hypoplastic right A1 segment. No intracranial aneurysm. Review of the MIP images confirms the above findings IMPRESSION: 1. Focal occlusion of a proximal right M2/M3 branch at the base of the right sylvian fissure as detailed above, concerning for LVO. 2. 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 No significant intraluminal thrombus or intimal irregularity identified. 3. Mild to moderate atherosclerotic change about the aortic arch and carotid siphons without hemodynamically significant stenosis. 4.  Emphysema (ICD10-J43.9). Critical Value/emergent results were called by telephone at the time of interpretation on 12/09/2018 at 10:20 pm to providerDr. Rory Percy, Who verbally acknowledged these results. Electronically Signed   By: Jeannine Boga M.D.    On: 12/09/2018 22:38   Ct Cerebral Perfusion W Contrast  Result Date: 12/09/2018 CLINICAL DATA:  Initial evaluation for acute aphasia, left-sided weakness. EXAM: CT PERFUSION BRAIN TECHNIQUE: Multiphase CT imaging of the brain was performed following IV bolus contrast injection. Subsequent parametric perfusion maps were calculated using RAPID software. CONTRAST:  46mL OMNIPAQUE IOHEXOL 350 MG/ML SOLN COMPARISON:  Prior CT and CTA from earlier same day. FINDINGS: CT Brain Perfusion Findings: CBF (<30%) Volume: 77mL Perfusion (Tmax>6.0s) volume: 15mL Mismatch Volume: 61mL ASPECTS on noncontrast CT Head: No aspects score provided. Infarct Core: 0 mL Infarction Location:Negative CT perfusion for acute core infarct or perfusion deficit. Upon further review of prior CTA, previously noted possible right M2/M3 occlusion felt to most likely reflect focal vascular tortuosity with associated stenosis. Attenuated but patent flow is seen distally. IMPRESSION: 1. Negative CT perfusion for acute core infarct or perfusion abnormality. 2. Upon further consideration and in conjunction with these perfusion findings, previously questioned right M2/M3 occlusion felt to be most consistent with focal vascular tortuosity rather than occlusion. These results were communicated to Dr. Rory Percy at 10:52 pmon 10/18/2020by text page via the [facility] messaging system. Electronically Signed   By: Jeannine Boga M.D.   On: 12/09/2018 22:54    Procedures Procedures (including critical care time)  Medications Ordered in ED Medications  iohexol (OMNIPAQUE) 350 MG/ML injection 50 mL (has no administration in time range)  polyethylene glycol (MIRALAX / GLYCOLAX) packet 17 g (has no administration in time range)  LORazepam (ATIVAN) injection 1 mg (has no administration in time range)  nicotine (NICODERM CQ - dosed in mg/24 hours) patch 21 mg (has no administration in time range)  LORazepam (ATIVAN) tablet 1-4 mg (has no administration in  time range)    Or  LORazepam (ATIVAN) injection 1-4 mg (has no administration in time range)  thiamine (VITAMIN B-1) tablet 100 mg (has no administration in time range)    Or  thiamine (B-1) injection 100 mg (has no administration in time range)  folic acid (FOLVITE) tablet 1 mg (has no administration in time range)  multivitamin with minerals tablet 1 tablet (has no administration in time range)  LORazepam (ATIVAN) injection 0-4 mg (has no administration in time range)    Followed by  LORazepam (ATIVAN) injection 0-4 mg (has no administration in time range)   stroke: mapping our early stages of recovery book (has no administration in time range)  acetaminophen (TYLENOL) tablet 650 mg (has no administration in time range)    Or  acetaminophen (TYLENOL) solution 650 mg (has no administration in time range)    Or  acetaminophen (TYLENOL) suppository 650 mg (has no administration in time range)  senna-docusate (Senokot-S) tablet 1 tablet (has no administration in time range)  enoxaparin (LOVENOX) injection 40 mg (has no administration in time range)  aspirin EC tablet 81 mg (has no administration in time range)  ondansetron (ZOFRAN) injection 4 mg (has no administration in time range)  hydrALAZINE (APRESOLINE) tablet 25 mg (has no administration in time range)  HYDROcodone-acetaminophen (NORCO/VICODIN) 5-325 MG per tablet 1 tablet (has no administration in time range)  letrozole (FEMARA) tablet 2.5 mg (has no administration in time range)  amLODipine (NORVASC) tablet 5 mg (has no administration in time range)  cloNIDine (CATAPRES) tablet 0.1 mg (has no administration in time range)  metoprolol succinate (TOPROL-XL) 24 hr tablet 100 mg (has no administration in time range)  ALPRAZolam (XANAX) tablet 0.5 mg (has no administration in time range)  venlafaxine XR (EFFEXOR-XR) 24 hr capsule 75 mg (has no administration in time range)  methylphenidate (RITALIN) tablet 20 mg (has no administration  in time range)  pantoprazole (PROTONIX) EC tablet 40 mg (has no administration in time range)  oxybutynin (DITROPAN-XL) 24 hr tablet 10 mg (has no administration in time range)  loratadine (CLARITIN) tablet 10 mg (has no administration in  time range)  sodium chloride 0.9 % bolus 1,000 mL (0 mLs Intravenous Stopped 12/09/18 2213)  iohexol (OMNIPAQUE) 350 MG/ML injection 80 mL (80 mLs Intravenous Contrast Given 12/09/18 2110)  iohexol (OMNIPAQUE) 350 MG/ML injection 40 mL (40 mLs Intravenous Contrast Given 12/09/18 2245)  LORazepam (ATIVAN) injection 1 mg (1 mg Intravenous Given 12/10/18 0004)     Initial Impression / Assessment and Plan / ED Course  I have reviewed the triage vital signs and the nursing notes.  Pertinent labs & imaging results that were available during my care of the patient were reviewed by me and considered in my medical decision making (see chart for details).        74 year old female who presents for evaluation of intermittent aphasia that has been occurring since 1130.  Reports 3 episodes in total where she feels like she is talking and cannot get her words out.  Home health nurse noted that she was stuttering having difficulty completing her sentences.  No numbness/weakness, vision changes.  No new head trauma.  On initial ED arrival, she is very anxious appearing she is slightly hypertensive and tachycardic.  Suspect this is most likely to anxiety.  Initially when I was evaluating patient, she was answering all questions appropriately tell me exactly what was going on.  During the middle of my exam, she had an additional episode that lasts about 5 to 10 minutes.  No tonic-clonic seizure activity.  During that time, she was unable to speak.  She was able to follow some commands and was able to move her arms and legs and follow my finger but was unable to stick her tongue out, smile.  Concern for TIA versus seizure.  We will plan to check labs, CT head. Do not suspect VAN  stroke.  Her symptoms resolved and she is completely back to baseline.  I discussed with Dr. Tamera Punt.  We will hold on calling code stroke at this time given resolution of symptoms and will consult neuro.  CBC shows slight leukocytosis of 12.7.  Platelets are 553.  CMP shows sodium of 121, BUN and creatinine within normal limits.  Troponin negative.  CT head shows area of decreased attenuation in the frontal lobes bilaterally, right greater than left.  This is increased from her previous study in 2018.  Concern is for acute ischemia.  Given her intermittent nature of symptoms, concern for embolic event.  Discussed patient with Dr. Rory Percy (Neurology) who would like [place] to obtain a CTA head and neck while she is here in the department and then transferred to the [facility] ED so that she can get a stat MRI.  If MRI shows acute stroke, she will likely need admission.  If unremarkable, she may need EEG but admission status will be decided once work-up has been completed.  Discussed patient with Dr. Darl Householder accepts patient transferred to [facility].  Discussed plan with patient sister.  Patient and sister agreeable to transfer to [facility].  I discussed the emergent nature of her transfer and that she will need to be transferred via ambulance.  Patient is agreeable.  At this time, patient is back to baseline.  She does not have any slurred speech, facial droop.  She is able to answer all my questions appropriately and is able to correctly identify mask and watch.  She will be transferred to [facility] for further evaluation.  Portions of this note were generated with [employer]. Dictation errors may occur despite best attempts at proofreading.  Final  Clinical Impressions(s) / ED Diagnoses   Final diagnoses:  Seizure-like activity [facility])  Hyponatremia    ED Discharge Orders    None       Volanda Napoleon, PA-C 12/10/18 0056    Malvin Johns, MD 12/18/18 [phone]

## 2018-12-09 NOTE — ED Triage Notes (Signed)
Pt reports that for the past day she has had trouble speaking. States that she knows what she wants to say but is unable to speak. Denies other symptoms.  CVA screen negative. No droop, drift noted. Pt A/O x 4 at this time. No trouble speaking in triage.

## 2018-12-09 NOTE — ED Notes (Signed)
Patient arrived via [place] for transfer from [facility]. Per [facility], pt became unresponsive x2 in route, but was A&O x4 on arrival. Malen Gauze, MD and Darl Householder Md at bedside when pt arrived.

## 2018-12-09 NOTE — ED Notes (Signed)
Pt assisted with bedpan at this time- unable to provide urine sample. Will attempt again shortly.

## 2018-12-09 NOTE — ED Notes (Signed)
Report given to [facility] 

## 2018-12-09 NOTE — ED Notes (Signed)
[facility] arrived to transport pt to [facility] 

## 2018-12-09 NOTE — ED Notes (Signed)
Patient transported to CT 

## 2018-12-09 NOTE — ED Notes (Signed)
[facility] notified Mindy Young) - patient needs transport to [facility] ED (Dr. Darl Householder accepting)

## 2018-12-09 NOTE — ED Provider Notes (Signed)
[place] DEPT Provider Note  CSN: [id number] Arrival date & time: 12/08/18 2306  Chief Complaint(s) Hip Pain  HPI Mindy Young is a 73 y.o. female with a past medical history listed below including recent pelvic fracture who presents to the emergency department with constipation and decreased urinary output for 1 day.  Was on MiraLAX but stopped taking it last week because she thought she was having good bowel movements.  Last bowel movement was earlier today of small pellets.  She denies any abdominal pain.  Endorsed 1 episode of nausea and nonbloody nonbilious emesis.  She endorsed chills but no fevers.  No chest pain or shortness of breath.  She is endorsing pain related to her hip fracture.  Other than that no other physical complaints.  HPI  Past Medical History Past Medical History:  Diagnosis Date  . Allergy   . Anemia 06/29/2011  . Anxiety   . Arthritis   . Breast cancer ([place])    right breast  . C1 cervical fracture ([place]) 02/1997  . Cancer ([place])    melanoma  . Depression   . Family history of breast cancer   . Family history of prostate cancer   . GERD (gastroesophageal reflux disease)   . Hypertension   . Tardive dyskinesia    Patient Active Problem List   Diagnosis Date Noted  . Pelvic fracture ([place]) 11/25/2018  . Pubic bone fracture ([place]) 11/24/2018  . Left knee pain 03/29/2018  . Unilateral primary osteoarthritis, left knee 03/29/2018  . Genetic testing 01/24/2018  . Family history of breast cancer   . Family history of prostate cancer   . Malignant neoplasm of lower-inner quadrant of right breast of female, estrogen receptor positive ([place]) 10/24/2017  . Anemia 06/29/2011  . Hypertension   . Hyponatremia 06/25/2011  . Transaminitis 06/25/2011  . Alcohol abuse 06/25/2011  . Cough 06/25/2011  . Cigarette smoker 06/25/2011   Home Medication(s) Prior to Admission medications   Medication Sig Start Date End Date Taking?  Authorizing Provider  ALPRAZolam Duanne Moron) 0.5 MG tablet Take 0.5 mg by mouth 2 (two) times daily as needed. For anxiety 08/10/10  Yes [provider]  amLODipine (NORVASC) 5 MG tablet Take 5 mg by mouth daily.   Yes [provider]  cetirizine (ZYRTEC) 10 MG tablet Take 10 mg by mouth as needed for allergies.   Yes [provider]  cloNIDine (CATAPRES) 0.1 MG tablet Take 1 tablet by mouth 2 (two) times daily. 11/07/16  Yes [provider]  desvenlafaxine (PRISTIQ) 50 MG 24 hr tablet Take 50 mg by mouth daily.   Yes [provider]  HYDROcodone-acetaminophen (NORCO/VICODIN) 5-325 MG tablet Take 1 tablet by mouth every 6 (six) hours as needed for moderate pain. 12/06/18  Yes Garald Balding, MD  letrozole [facility]) 2.5 MG tablet TAKE 1 TABLET(2.5 MG) BY MOUTH DAILY Patient taking differently: Take 2.5 mg by mouth daily.  10/12/18  Yes Nicholas Lose, MD  methylphenidate (RITALIN) 20 MG tablet Take 20 mg by mouth daily.    Yes [provider]  metoprolol succinate (TOPROL-XL) 100 MG 24 hr tablet Take 100 mg by mouth daily.  02/19/18  Yes [provider]  omeprazole (PRILOSEC) 40 MG capsule TAKE 1 CAPSULE BY MOUTH TWICE DAILY 30 MINUTES BEFORE BREAKFAST AND 30 MINUTES BEFORE DINNER Patient taking differently: Take 40 mg by mouth 2 (two) times daily. 30 minutes before breakfast, and 30  minutes before dinner. 10/12/18  Yes Nandigam,  Venia Minks, MD  oxybutynin (DITROPAN-XL) 10 MG 24 hr tablet Take 10 mg by mouth daily.  12/05/16  Yes [provider]  polyethylene glycol (MIRALAX / GLYCOLAX) 17 g packet Take 17 g by mouth daily as needed for moderate constipation. 11/26/18  Yes Oswald Hillock, MD  promethazine (PHENERGAN) 25 MG tablet Take 25 mg by mouth every 6 (six) hours as needed for nausea or vomiting.   Yes [provider]  HYDROcodone-acetaminophen (NORCO/VICODIN) 5-325 MG tablet Take 1-2 tablets by mouth every 6 (six) hours as  needed for severe pain. Patient not taking: Reported on 12/08/2018 11/29/18   Cherylann Ratel, PA-C  methocarbamol (ROBAXIN) 500 MG tablet Take 1 tablet TID prn Patient not taking: Reported on 12/08/2018 12/06/18   Garald Balding, MD                                                                                                                                    Past Surgical History Past Surgical History:  Procedure Laterality Date  . ABDOMINAL HYSTERECTOMY    . ASPIRATION OF ABSCESS Right 11/20/2017   Procedure: ASPIRATION OF RIGHT AXILLARY SEROMA;  Surgeon: Rolm Bookbinder, MD;  Location: [place];  Service: General;  Laterality: Right;  . AUGMENTATION MAMMAPLASTY Bilateral    biateral implants , approx 2015  . BREAST LUMPECTOMY Right 11/02/2017   re-ex 11-20-17  . BREAST LUMPECTOMY WITH RADIOACTIVE SEED AND SENTINEL LYMPH NODE BIOPSY Right 11/02/2017   Procedure: BREAST LUMPECTOMY WITH RADIOACTIVE SEED AND SENTINEL LYMPH NODE BIOPSY;  Surgeon: Rolm Bookbinder, MD;  Location: [place];  Service: General;  Laterality: Right;  . CHOLECYSTECTOMY    . collarbone    . INCONTINENCE SURGERY    . KNEE SURGERY     removal of cyst, repair of cartiledge  . LAPAROSCOPY     for endometriosis  . RE-EXCISION OF BREAST LUMPECTOMY Right 11/20/2017   Procedure: RE-EXCISION OF RIGHT BREAST MARGINS;  Surgeon: Rolm Bookbinder, MD;  Location: [place];  Service: General;  Laterality: Right;  . ROTATOR CUFF REPAIR     left   Family History Family History  Problem Relation Age of Onset  . COPD Mother   . Prostate cancer Father 54       seed implant for treatment  . Colon polyps Father        'a few'  . Breast cancer Sister 58  . Breast cancer Maternal Aunt        dx >50  . Breast cancer Other 35       bilateral  . Colon cancer Neg Hx     Social History Social History   Tobacco Use  . Smoking status: Current Every Day Smoker     Packs/day: 1.00    Types: Cigarettes  . Smokeless tobacco: Never Used  . Tobacco comment: e-cigs  Substance Use Topics  . Alcohol  use: Yes    Alcohol/week: 21.0 standard drinks    Types: 21 Standard drinks or equivalent per week    Comment: 2 per weekend day; denies during the week (not consistent with prior hx)  . Drug use: No   Allergies Percocet [oxycodone-acetaminophen]  Review of Systems Review of Systems All other systems are reviewed and are negative for acute change except as noted in the HPI  Physical Exam Vital Signs  I have reviewed the triage vital signs BP (!) 159/97 (BP Location: Left Arm)   Pulse 90   Temp 97.9 F (36.6 C) (Oral)   Resp 18   Ht 5\' 2"  (1.575 m)   Wt 68 kg   SpO2 94%   BMI 27.44 kg/m   Physical Exam Vitals signs reviewed.  Constitutional:      General: She is not in acute distress.    Appearance: She is well-developed. She is not diaphoretic.  HENT:     Head: Normocephalic and atraumatic.     Right Ear: External ear normal.     Left Ear: External ear normal.     Nose: Nose normal.  Eyes:     General: No scleral icterus.    Conjunctiva/sclera: Conjunctivae normal.  Neck:     Musculoskeletal: Normal range of motion.     Trachea: Phonation normal.  Cardiovascular:     Rate and Rhythm: Normal rate and regular rhythm.  Pulmonary:     Effort: Pulmonary effort is normal. No respiratory distress.     Breath sounds: No stridor.  Abdominal:     General: There is no distension.     Tenderness: There is no abdominal tenderness. There is no guarding or rebound.    Musculoskeletal: Normal range of motion.  Neurological:     Mental Status: She is alert and oriented to person, place, and time.  Psychiatric:        Behavior: Behavior normal.     ED Results and Treatments Labs (all labs ordered are listed, but only abnormal results are displayed) Labs Reviewed  URINALYSIS, ROUTINE W REFLEX MICROSCOPIC - Abnormal; Notable for the  following components:      Result Value   Color, Urine STRAW (*)    Specific Gravity, Urine 1.003 (*)    All other components within normal limits                                                                                                                         EKG  EKG Interpretation  Date/Time:    Ventricular Rate:    PR Interval:    QRS Duration:   QT Interval:    QTC Calculation:   R Axis:     Text Interpretation:        Radiology No results found.  Pertinent labs & imaging results that were available during my care of the patient were reviewed by me and considered in my medical decision making (see chart for details).  Medications Ordered  in ED Medications  ondansetron (ZOFRAN) injection 4 mg (4 mg Intravenous Given 12/09/18 0027)  HYDROmorphone (DILAUDID) injection 0.5 mg (0.5 mg Intramuscular Given 12/09/18 0102)                                                                                                                                    Procedures Procedures  (including critical care time)  Medical Decision Making / ED Course I have reviewed the nursing notes for this encounter and the patient's prior records (if available in EHR or on provided paperwork).   TALESHA GHENT was evaluated in Emergency Department on 12/09/2018 for the symptoms described in the history of present illness. She was evaluated in the context of the global COVID-19 pandemic, which necessitated consideration that the patient might be at risk for infection with the SARS-CoV-2 virus that causes COVID-19. Institutional protocols and algorithms that pertain to the evaluation of patients at risk for COVID-19 are in a state of rapid change based on information released by regulatory bodies including the CDC and federal and state organizations. These policies and algorithms were followed during the patient's care in the ED.  Patient presents with complaint of constipation and decreased urinary  output today.  She is afebrile with stable vital signs.  She is well-appearing, well-hydrated and nontoxic.  Abdomen benign.  Bladder scan greater than 250 cc in the bladder.  Patient was able to void without complication.  Provided approximately 300 cc.  UA without evidence of infection.  Doubt bowel obstruction.   The patient appears reasonably screened and/or stabilized for discharge and I doubt any other medical condition or other [facility] requiring further screening, evaluation, or treatment in the ED at this time prior to discharge.  The patient is safe for discharge with strict return precautions.       Final Clinical Impression(s) / ED Diagnoses Final diagnoses:  Drug-induced constipation    The patient appears reasonably screened and/or stabilized for discharge and I doubt any other medical condition or other [facility] requiring further screening, evaluation, or treatment in the ED at this time prior to discharge.  Disposition: Discharge  Condition: Good  I have discussed the results, Dx and Tx plan with the patient who expressed understanding and agree(s) with the plan. Discharge instructions discussed at great length. The patient was given strict return precautions who verbalized understanding of the instructions. No further questions at time of discharge.    ED Discharge Orders    None       Follow Up: Redmon, Noelle, PA-C [address] [place]  [address] [phone]  Schedule an appointment as soon as possible for a visit  As needed      This chart was dictated using voice recognition software.  Despite best efforts to proofread,  errors can occur which can change the documentation meaning.   Fatima Blank, MD 12/09/18 [phone]

## 2018-12-10 ENCOUNTER — Other Ambulatory Visit: Payer: Self-pay

## 2018-12-10 ENCOUNTER — Inpatient Hospital Stay (HOSPITAL_COMMUNITY): Payer: Medicare Other

## 2018-12-10 LAB — BASIC METABOLIC PANEL
Anion gap: 12 (ref 5–15)
Anion gap: 9 (ref 5–15)
Anion gap: 12 (ref 5–15)
Anion gap: 12 (ref 5–15)
BUN: 7 mg/dL — ABNORMAL LOW (ref 8–23)
BUN: 6 mg/dL — ABNORMAL LOW (ref 8–23)
BUN: 9 mg/dL (ref 8–23)
BUN: 9 mg/dL (ref 8–23)
CO2: 22 mmol/L (ref 22–32)
CO2: 22 mmol/L (ref 22–32)
CO2: 21 mmol/L — ABNORMAL LOW (ref 22–32)
CO2: 21 mmol/L — ABNORMAL LOW (ref 22–32)
Calcium: 8.5 mg/dL — ABNORMAL LOW (ref 8.9–10.3)
Calcium: 8.5 mg/dL — ABNORMAL LOW (ref 8.9–10.3)
Calcium: 8.6 mg/dL — ABNORMAL LOW (ref 8.9–10.3)
Calcium: 8.3 mg/dL — ABNORMAL LOW (ref 8.9–10.3)
Chloride: 94 mmol/L — ABNORMAL LOW (ref 98–111)
Chloride: 95 mmol/L — ABNORMAL LOW (ref 98–111)
Chloride: 89 mmol/L — ABNORMAL LOW (ref 98–111)
Chloride: 88 mmol/L — ABNORMAL LOW (ref 98–111)
Creatinine, Ser: 0.66 mg/dL (ref 0.44–1.00)
Creatinine, Ser: 0.54 mg/dL (ref 0.44–1.00)
Creatinine, Ser: 0.66 mg/dL (ref 0.44–1.00)
Creatinine, Ser: 0.65 mg/dL (ref 0.44–1.00)
GFR calc Af Amer: >60 mL/min (ref >60)
GFR calc Af Amer: >60 mL/min (ref >60)
GFR calc Af Amer: >60 mL/min (ref >60)
GFR calc Af Amer: >60 mL/min (ref >60)
GFR calc non Af Amer: >60 mL/min (ref >60)
GFR calc non Af Amer: >60 mL/min (ref >60)
GFR calc non Af Amer: >60 mL/min (ref >60)
GFR calc non Af Amer: >60 mL/min (ref >60)
Glucose, Bld: 125 mg/dL — ABNORMAL HIGH (ref 70–99)
Glucose, Bld: 108 mg/dL — ABNORMAL HIGH (ref 70–99)
Glucose, Bld: 98 mg/dL (ref 70–99)
Glucose, Bld: 116 mg/dL — ABNORMAL HIGH (ref 70–99)
Potassium: 3.4 mmol/L — ABNORMAL LOW (ref 3.5–5.1)
Potassium: 3.6 mmol/L (ref 3.5–5.1)
Potassium: 3.5 mmol/L (ref 3.5–5.1)
Potassium: 3.3 mmol/L — ABNORMAL LOW (ref 3.5–5.1)
Sodium: 128 mmol/L — ABNORMAL LOW (ref 135–145)
Sodium: 126 mmol/L — ABNORMAL LOW (ref 135–145)
Sodium: 122 mmol/L — ABNORMAL LOW (ref 135–145)
Sodium: 121 mmol/L — ABNORMAL LOW (ref 135–145)

## 2018-12-10 LAB — MAGNESIUM: Magnesium: 1.7 mg/dL (ref 1.7–2.4)

## 2018-12-10 LAB — OSMOLALITY, URINE
Osmolality, Ur: 181 mOsm/kg — ABNORMAL LOW (ref 300–900)
Osmolality, Ur: 248 mOsm/kg — ABNORMAL LOW (ref 300–900)

## 2018-12-10 LAB — TSH: TSH: 0.974 u[IU]/mL (ref 0.350–4.500)

## 2018-12-10 LAB — SARS CORONAVIRUS 2 (TAT 6-24 HRS): SARS Coronavirus 2: NEGATIVE

## 2018-12-10 LAB — LIPID PANEL
Cholesterol: 157 mg/dL (ref 0–200)
HDL: 59 mg/dL (ref >40)
LDL Cholesterol: 88 mg/dL (ref 0–99)
Total CHOL/HDL Ratio: 2.7 RATIO
Triglycerides: 51 mg/dL (ref <150)
VLDL: 10 mg/dL (ref 0–40)

## 2018-12-10 LAB — OSMOLALITY: Osmolality: 261 mOsm/kg — ABNORMAL LOW (ref 275–295)

## 2018-12-10 LAB — SODIUM, URINE, RANDOM: Sodium, Ur: 33 mmol/L

## 2018-12-10 LAB — PHOSPHORUS: Phosphorus: 3.8 mg/dL (ref 2.5–4.6)

## 2018-12-10 MED ORDER — ACETAMINOPHEN 325 MG PO TABS: 650.0000 mg | ORAL_TABLET | ORAL | Status: DC | PRN

## 2018-12-10 MED ORDER — STROKE: EARLY STAGES OF RECOVERY BOOK
Freq: Once | Status: AC
  Administered 2018-12-10: 01:00:00
  Filled 2018-12-10: qty 1

## 2018-12-10 MED ORDER — LORAZEPAM 1 MG PO TABS: 1.0000 mg | ORAL_TABLET | ORAL | Status: AC | PRN

## 2018-12-10 MED ORDER — ADULT MULTIVITAMIN W/MINERALS CH
1.0000 | ORAL_TABLET | Freq: Every day | ORAL | Status: DC
  Administered 2018-12-10 – 2018-12-13 (×4): 1 via ORAL
  Filled 2018-12-10 (×4): qty 1

## 2018-12-10 MED ORDER — LORAZEPAM 2 MG/ML IJ SOLN: 0.0000 mg | Freq: Two times a day (BID) | INTRAMUSCULAR | Status: DC

## 2018-12-10 MED ORDER — AMLODIPINE BESYLATE 5 MG PO TABS
5.0000 mg | ORAL_TABLET | Freq: Every day | ORAL | Status: DC
  Administered 2018-12-10 – 2018-12-13 (×4): 5 mg via ORAL
  Filled 2018-12-10 (×4): qty 1

## 2018-12-10 MED ORDER — LORAZEPAM 2 MG/ML IJ SOLN: 1.0000 mg | INTRAMUSCULAR | Status: DC | PRN

## 2018-12-10 MED ORDER — ALPRAZOLAM 0.5 MG PO TABS
0.5000 mg | ORAL_TABLET | Freq: Two times a day (BID) | ORAL | Status: DC | PRN
  Administered 2018-12-11 – 2018-12-12 (×2): 0.5 mg via ORAL
  Filled 2018-12-10 (×2): qty 1

## 2018-12-10 MED ORDER — LORATADINE 10 MG PO TABS
10.0000 mg | ORAL_TABLET | Freq: Every day | ORAL | Status: DC
  Administered 2018-12-10 – 2018-12-13 (×4): 10 mg via ORAL
  Filled 2018-12-10 (×4): qty 1

## 2018-12-10 MED ORDER — SODIUM CHLORIDE 0.9 % IV SOLN
INTRAVENOUS | Status: DC
  Administered 2018-12-10 (×2): via INTRAVENOUS

## 2018-12-10 MED ORDER — SENNOSIDES-DOCUSATE SODIUM 8.6-50 MG PO TABS
1.0000 | ORAL_TABLET | Freq: Every day | ORAL | Status: DC
  Administered 2018-12-10 – 2018-12-12 (×4): 1 via ORAL
  Filled 2018-12-10 (×4): qty 1

## 2018-12-10 MED ORDER — THIAMINE HCL 100 MG/ML IJ SOLN: 100.0000 mg | Freq: Every day | INTRAMUSCULAR | Status: DC

## 2018-12-10 MED ORDER — LORAZEPAM 2 MG/ML IJ SOLN
0.0000 mg | Freq: Four times a day (QID) | INTRAMUSCULAR | Status: AC
  Filled 2018-12-10: qty 1

## 2018-12-10 MED ORDER — POLYETHYLENE GLYCOL 3350 17 G PO PACK: 17.0000 g | PACK | Freq: Every day | ORAL | Status: DC | PRN

## 2018-12-10 MED ORDER — OXYBUTYNIN CHLORIDE ER 10 MG PO TB24
10.0000 mg | ORAL_TABLET | Freq: Every day | ORAL | Status: DC
  Filled 2018-12-10: qty 1

## 2018-12-10 MED ORDER — ASPIRIN EC 81 MG PO TBEC
81.0000 mg | DELAYED_RELEASE_TABLET | Freq: Every day | ORAL | Status: DC
  Administered 2018-12-10 – 2018-12-13 (×4): 81 mg via ORAL
  Filled 2018-12-10 (×4): qty 1

## 2018-12-10 MED ORDER — HYDRALAZINE HCL 25 MG PO TABS: 25.0000 mg | ORAL_TABLET | Freq: Three times a day (TID) | ORAL | Status: DC | PRN

## 2018-12-10 MED ORDER — METOPROLOL SUCCINATE ER 100 MG PO TB24
100.0000 mg | ORAL_TABLET | Freq: Every day | ORAL | Status: DC
  Administered 2018-12-10 – 2018-12-13 (×4): 100 mg via ORAL
  Filled 2018-12-10 (×4): qty 1

## 2018-12-10 MED ORDER — ONDANSETRON HCL 4 MG/2ML IJ SOLN: 4.0000 mg | Freq: Three times a day (TID) | INTRAMUSCULAR | Status: DC | PRN

## 2018-12-10 MED ORDER — LORAZEPAM 2 MG/ML IJ SOLN
1.0000 mg | INTRAMUSCULAR | Status: AC | PRN
  Administered 2018-12-10: 1 mg via INTRAVENOUS

## 2018-12-10 MED ORDER — METHYLPHENIDATE HCL 5 MG PO TABS
20.0000 mg | ORAL_TABLET | Freq: Every day | ORAL | Status: DC
  Administered 2018-12-10 – 2018-12-13 (×4): 20 mg via ORAL
  Filled 2018-12-10 (×4): qty 4

## 2018-12-10 MED ORDER — CLONIDINE HCL 0.1 MG PO TABS
0.1000 mg | ORAL_TABLET | Freq: Two times a day (BID) | ORAL | Status: DC
  Administered 2018-12-10 – 2018-12-11 (×5): 0.1 mg via ORAL
  Filled 2018-12-10 (×5): qty 1

## 2018-12-10 MED ORDER — VENLAFAXINE HCL ER 75 MG PO CP24
75.0000 mg | ORAL_CAPSULE | Freq: Every day | ORAL | Status: DC
  Administered 2018-12-10: 75 mg via ORAL
  Filled 2018-12-10: qty 1

## 2018-12-10 MED ORDER — ENOXAPARIN SODIUM 40 MG/0.4ML ~~LOC~~ SOLN
40.0000 mg | SUBCUTANEOUS | Status: DC
  Administered 2018-12-10 – 2018-12-13 (×4): 40 mg via SUBCUTANEOUS
  Filled 2018-12-10 (×4): qty 0.4

## 2018-12-10 MED ORDER — ACETAMINOPHEN 650 MG RE SUPP: 650.0000 mg | RECTAL | Status: DC | PRN

## 2018-12-10 MED ORDER — FOLIC ACID 1 MG PO TABS
1.0000 mg | ORAL_TABLET | Freq: Every day | ORAL | Status: DC
  Administered 2018-12-10 – 2018-12-13 (×4): 1 mg via ORAL
  Filled 2018-12-10 (×4): qty 1

## 2018-12-10 MED ORDER — ACETAMINOPHEN 160 MG/5ML PO SOLN: 650.0000 mg | ORAL | Status: DC | PRN

## 2018-12-10 MED ORDER — LETROZOLE 2.5 MG PO TABS
2.5000 mg | ORAL_TABLET | Freq: Every day | ORAL | Status: DC
  Administered 2018-12-10 – 2018-12-13 (×4): 2.5 mg via ORAL
  Filled 2018-12-10 (×4): qty 1

## 2018-12-10 MED ORDER — HYDROCODONE-ACETAMINOPHEN 5-325 MG PO TABS
1.0000 | ORAL_TABLET | Freq: Four times a day (QID) | ORAL | Status: DC | PRN
  Administered 2018-12-10 – 2018-12-13 (×4): 1 via ORAL
  Filled 2018-12-10 (×4): qty 1

## 2018-12-10 MED ORDER — NICOTINE 21 MG/24HR TD PT24
21.0000 mg | MEDICATED_PATCH | Freq: Every day | TRANSDERMAL | Status: DC
  Administered 2018-12-10 – 2018-12-13 (×4): 21 mg via TRANSDERMAL
  Filled 2018-12-10 (×4): qty 1

## 2018-12-10 MED ORDER — ENSURE ENLIVE PO LIQD
237.0000 mL | Freq: Two times a day (BID) | ORAL | Status: DC
  Administered 2018-12-10 – 2018-12-13 (×7): 237 mL via ORAL

## 2018-12-10 MED ORDER — BIOTENE DRY MOUTH MT LIQD: 15.0000 mL | OROMUCOSAL | Status: DC | PRN

## 2018-12-10 MED ORDER — PANTOPRAZOLE SODIUM 40 MG PO TBEC
40.0000 mg | DELAYED_RELEASE_TABLET | Freq: Every day | ORAL | Status: DC
  Administered 2018-12-10 – 2018-12-13 (×4): 40 mg via ORAL
  Filled 2018-12-10 (×4): qty 1

## 2018-12-10 MED ORDER — VITAMIN B-1 100 MG PO TABS
100.0000 mg | ORAL_TABLET | Freq: Every day | ORAL | Status: DC
  Administered 2018-12-10 – 2018-12-13 (×4): 100 mg via ORAL
  Filled 2018-12-10 (×4): qty 1

## 2018-12-10 NOTE — Progress Notes (Signed)
Pushed event button because was talking to her daughter and this nurse and all of a sudden had choppy, repetitive speech and could not get her words to come out fluently.  This lasted for approx 1 minute.  Started at [phone].  She did not remember 3 purple cows but did recall 3 brown cows.  She is now alert and following commands.  Speech is clearer and she is able to communicate her wants and needs.  No signs of distress at this time.

## 2018-12-10 NOTE — TOC Initial Note (Signed)
Transition of Care [facility]) - Initial/Assessment Note    Patient Details  Name: Mindy Young MRN: [id number] Date of Birth: September 30, 1945  [facility] CM/SW Contact:    Pollie Friar, RN Phone Number: 12/10/2018, 2:22 PM  Clinical Narrative:                 Pt lives alone but has 24 hour caregivers since her fall 2 weeks ago. She has a walker and 3 in 1 at home.  Caregivers able to provide needed transportation. Pt anxious to get out of hospital d/t missing her service dog.  TOC following.  Expected Discharge Plan: [place] Barriers to Discharge: Continued Medical Work up   Patient Goals and CMS Choice        Expected Discharge Plan and Services Expected Discharge Plan: [place]   Discharge Planning Services: CM Consult Post Acute Care Choice: Home Health                                        Prior Living Arrangements/Services   Lives with:: Self Patient language and need for interpreter reviewed:: Yes Do you feel safe going back to the place where you live?: Yes      Need for Family Participation in Patient Care: Yes (Comment) Care giver support system in place?: Yes (comment)(pt has 24 hour caregivers) Current home services: Homehealth aide(24 hour caregivers through [employer]) Criminal Activity/Legal Involvement Pertinent to Current Situation/Hospitalization: No - Comment as needed  Activities of Daily Living Home Assistive Devices/Equipment: None ADL Screening (condition at time of admission) Patient's cognitive ability adequate to safely complete daily activities?: Yes Is the patient deaf or have difficulty hearing?: Yes(left ear) Does the patient have difficulty seeing, even when wearing glasses/contacts?: No Does the patient have difficulty concentrating, remembering, or making decisions?: No Patient able to express need for assistance with ADLs?: Yes Does the patient have difficulty dressing or  bathing?: Yes Independently performs ADLs?: Yes (appropriate for developmental age) Does the patient have difficulty walking or climbing stairs?: Yes Weakness of Legs: Both Weakness of Arms/Hands: None  Permission Sought/Granted                  Emotional Assessment Appearance:: Appears stated age Attitude/Demeanor/Rapport: Crying Affect (typically observed): Anxious Orientation: : Oriented to Self, Oriented to Place, Oriented to  Time, Oriented to Situation Alcohol / Substance Use: Alcohol Use Psych Involvement: No (comment)  Admission diagnosis:  Hyponatremia [E87.1] Seizure-like activity ([place]) [R56.9] Difficulty speaking [R47.9] Patient Active Problem List   Diagnosis Date Noted  . GERD (gastroesophageal reflux disease) 12/09/2018  . Anxiety 12/09/2018  . Difficulty speaking 12/09/2018  . Leukocytosis 12/09/2018  . Seizure-like activity ([place]) 12/09/2018  . Pelvic fracture ([place]) 11/25/2018  . Pubic bone fracture ([place]) 11/24/2018  . Left knee pain 03/29/2018  . Unilateral primary osteoarthritis, left knee 03/29/2018  . Genetic testing 01/24/2018  . Family history of breast cancer   . Family history of prostate cancer   . Malignant neoplasm of lower-inner quadrant of right breast of female, estrogen receptor positive ([place]) 10/24/2017  . Anemia 06/29/2011  . Hypertension   . Hyponatremia 06/25/2011  . Transaminitis 06/25/2011  . Alcohol abuse 06/25/2011  . Cough 06/25/2011  . Cigarette smoker 06/25/2011   PCP:  Lennie Odor, PA-C Pharmacy:   [facility] [phone] [facility] [place], [place] -  [place] [address] [place] [address] Phone: [phone] Fax: [facility] [phone] - [place], [place] - [address] AT [place] [address] [place] [employer] [address] Phone: [phone] Fax: [phone]  [facility] [place], [place] [address] [place] [address] Phone: [phone] Fax: [phone]     Social Determinants of Health (SDOH) Interventions    Readmission Risk Interventions No flowsheet data found.

## 2018-12-10 NOTE — Procedures (Signed)
ELECTROENCEPHALOGRAM REPORT   Patient: Mindy Young       Room #: [id number] EEG No. ID: [id number] Age: 73 y.o.        Sex: female Referring Physician: Neysa Bonito Report Date:  12/10/2018        Interpreting Physician: Alexis Goodell  History: Mindy Young is an 73 y.o. female with seizure like activity  Medications:  Norvasc, Catapres, Femara, Ritalin, Ativan, Toprol, Ditropan, Thiamine, Effexor  Conditions of Recording:  This is a 21 channel routine scalp EEG performed with bipolar and monopolar montages arranged in accordance to the international 10/20 system of electrode placement. One channel was dedicated to EKG recording.  The patient is in the awake state.  Description:  Artifact is prominent during the recording often obscuring the background rhythm. When able to be visualized the background consists of a low voltage, symmetrical, fairly well organized, 9 Hz alpha activity, seen from the parieto-occipital and posterior temporal regions.  Low voltage fast activity, poorly organized, is seen anteriorly and is at times superimposed on more posterior regions.  A mixture of theta and alpha rhythms are seen from the central and temporal regions. The patient does not drowse or sleep. No epileptiform activity is noted.   Hyperventilation and intermittent photic stimulation were not performed  IMPRESSION: This is a normal awake electroencephalogram. There are no focal lateralizing or epileptiform features.   Alexis Goodell, MD Neurology [phone] 12/10/2018, 11:34 AM

## 2018-12-10 NOTE — Evaluation (Signed)
Physical Therapy Evaluation Patient Details Name: Mindy Young MRN: [id number] DOB: [birth year] Today's Date: 12/10/2018   History of Present Illness  Mindy Young is a 73 y.o. female with medical history significant of hypertension, GERD, depression, anxiety, tardive dyskinesia, C1 cervical spine fracture, breast cancer (right lumpectomy), alcohol and tobacco abuse, hyponatremia, recent pubic bone fracture, who presents with difficulty speaking and seizure-like activity.  Clinical Impression  Pt admitted with above. Pt easily distracted and noted to have increased anxiety. Pt with service dog and is very worried about him. Dtr hopes to bring dog in. Pt at home alone typically but since her fall 2 weeks ago she has had 24/7 assist and HHPT coming out to home and has been using a RW. Pt was able to transfer OOB to chair with min guard. Anticipate pt will be able to return home with 24/7 assist and use of RW once medically stable. Acute PT to con't to follow.    Follow Up Recommendations Home health PT;Supervision/Assistance - 24 hour(continue)    Equipment Recommendations  None recommended by PT    Recommendations for Other Services       Precautions / Restrictions Precautions Precautions: Fall Restrictions Weight Bearing Restrictions: No Other Position/Activity Restrictions: WBAT R LE      Mobility  Bed Mobility Overal bed mobility: Needs Assistance Bed Mobility: Supine to Sit     Supine to sit: Supervision;HOB elevated     General bed mobility comments: pt able to bring self to eob with use of bed rail and HOB elevated  Transfers Overall transfer level: Needs assistance Equipment used: Rolling walker (2 wheeled) Transfers: Sit to/from [facility] Sit to Stand: Min guard Stand pivot transfers: Min guard       General transfer comment: min guard for safety due to first time up, slow and guarded due to pain, limited R LE WBIng due to  pain  Ambulation/Gait             General Gait Details: attempted to march in place, pt reports being in a lot of pain last time she did this and then MD came in to speak to patient  Stairs            Wheelchair Mobility    Modified Rankin (Stroke Patients Only)       Balance Overall balance assessment: Needs assistance;History of Falls Sitting-balance support: Feet supported;No upper extremity supported Sitting balance-Leahy Scale: Fair     Standing balance support: During functional activity;Bilateral upper extremity supported Standing balance-Leahy Scale: Poor Standing balance comment: reliant on UEs                             Pertinent Vitals/Pain Pain Assessment: Faces Faces Pain Scale: Hurts little more Pain Location: R LE during std pvt to chair Pain Descriptors / Indicators: Grimacing Pain Intervention(s): Limited activity within patient's tolerance    Home Living Family/patient expects to be discharged to:: Private residence Living Arrangements: Alone(however currently has 24/7 assist) Available Help at Discharge: Family;Available 24 hours/day(also has a service dog) Type of Home: House Home Access: Stairs to enter Entrance Stairs-Rails: Right;Left;Can reach both Entrance Stairs-Number of Steps: 5 Home Layout: One level Home Equipment: Walker - 2 wheels Additional Comments: has as service dog to help her manage her drops in BP, pt has had 24/7 assist since her fall and has HHPT coming out    Prior Function Level of Independence: Independent with  assistive device(s)         Comments: uses RW currently due to recent fall and broken pelvis     Hand Dominance        Extremity/Trunk Assessment   Upper Extremity Assessment Upper Extremity Assessment: Overall WFL for tasks assessed    Lower Extremity Assessment Lower Extremity Assessment: RLE deficits/detail RLE Deficits / Details: able to complete LAQ however slow and lacks  terminal knee extension, limited active hip flexion in standing due to onset of pain       Communication   Communication: No difficulties(however has tangental speech)  Cognition Arousal/Alertness: Awake/alert Behavior During Therapy: WFL for tasks assessed/performed Overall Cognitive Status: Within Functional Limits for tasks assessed                                 General Comments: pt with very tangential speech, daughter does report some things she says is right somethings are wrong      General Comments General comments (skin integrity, edema, etc.): HR dec to 58, raised to 76 once seated s/p std pvt    Exercises     Assessment/Plan    PT Assessment Patient needs continued PT services  PT Problem List Decreased strength;Decreased range of motion;Decreased activity tolerance;Decreased mobility;Decreased balance;Pain;Decreased knowledge of use of DME       PT Treatment Interventions DME instruction;Gait training;Therapeutic exercise;Functional mobility training;Therapeutic activities;Stair training;Patient/family education    PT Goals (Current goals can be found in the Care Plan section)  Acute Rehab PT Goals Patient Stated Goal: to go home asap PT Goal Formulation: With patient Time For Goal Achievement: 12/24/18 Potential to Achieve Goals: Good    Frequency Min 4X/week   Barriers to discharge        Co-evaluation               AM-PAC PT "6 Clicks" Mobility  Outcome Measure Help needed turning from your back to your side while in a flat bed without using bedrails?: A Little Help needed moving from lying on your back to sitting on the side of a flat bed without using bedrails?: A Little Help needed moving to and from a bed to a chair (including a wheelchair)?: A Little Help needed standing up from a chair using your arms (e.g., wheelchair or bedside chair)?: A Little Help needed to walk in hospital room?: A Little Help needed climbing 3-5  steps with a railing? : A Little 6 Click Score: 18    End of Session Equipment Utilized During Treatment: Gait belt Activity Tolerance: Patient tolerated treatment well Patient left: in chair;with call bell/phone within reach;with family/visitor present Nurse Communication: Mobility status PT Visit Diagnosis: Difficulty in walking, not elsewhere classified (R26.2)    Time: 1300-1330 PT Time Calculation (min) (ACUTE ONLY): 30 min   Charges:   PT Evaluation $PT Eval Moderate Complexity: 1 Mod PT Treatments $Therapeutic Activity: 8-22 mins        Kittie Plater, PT, DPT Acute Rehabilitation Services Pager #: [phone] Office #: [phone]   Berline Lopes 12/10/2018, 2:15 PM

## 2018-12-10 NOTE — Progress Notes (Signed)
LTM EEG hooked up and running - no initial skin breakdown - push button tested - neuro notified.  

## 2018-12-10 NOTE — Progress Notes (Signed)
PROGRESS NOTE    HEATHER LIKES   [id number] DOB: 01/11/1946 DOA: 12/09/2018  Admitted from: Home PCP: Lennie Odor, [facility] Summary  This is a 73 year old female with past medical history of hypertension, anxiety/depression, breast cancer, alcohol abuse, tobacco use, C1 cervical spine fracture, chronic hyponatremia, recent pubic bone fracture, chronic pain on narcotics and muscle relaxers as well as Xanax who was brought to the ED on 10/18 who presented with difficulty speaking and seizure-like activity.  She was seen having difficulty speaking and stuttering about 11:30 AM prior to admission and has had 3 additional similar prior episodes.  Patient had a unilateral left-sided gaze not responding while her eyes were open but denied unilateral weakness or numbness in the extremities.  In the ED was found to have WBC 12.7, negative UA, negative EtOH, sodium 121, CT a head and neck with questionable right M2 stenosis versus occlusion versus tortuous vessel artifact, CT brain perfusion negative.  Neurology was consulted.  Patient had EEG which was negative.  MRI brain without acute findings.  Nephrology was consulted for acute on chronic hyponatremia with seizure-like activity thought to be symptomatic from hyponatremia.  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  Had seizure-like activity when I was in the room-was speaking in full sentences and suddenly had difficulty getting words out then had a staring spell followed by aphasic speech.  This lasted roughly 1 to 2 minutes.  Notified neurology . Consider continuous EEG  monitoring . Nephrology consulted . Neurology on board . Holding oxybutynin as this has seizure risk as well  Hyponatremia in setting of alcohol use and history of beer potomania sodium 121 at presentation increased to 128 and then started to downtrend, currently 122.  On Effexor which can cause SIADH . Per nephrology: Hold Effexor, fluid restrict to 2 L, continue NS at 75 cc/h, trend sodium and no need for hypertonic saline  Alcohol use in setting of depression has had an increase in alcohol intake over the past year with several stressors including passing of her father from [place], daughter moving away who she is very close to. . Counseled on alcohol cessation . CIWA protocol  Tobacco use . Counseled on quitting . Nicotine patch  Depression . Holding Effexor as above . Follow-up with outpatient psych  Hypertension . Continue home amlodipine, clonidine, metoprolol  Right breast cancer, ER positive . Continue letrozole  GERD . Protonix  Anxiety . On alprazolam 0.5 mg twice daily as needed, monitor for overdose if she receives Ativan per CIWA protocol  Leukocytosis afebrile.  Chest x-ray not done on presentation and patient was coughing on exam.  UA negative.  More likely reactive in setting of hyponatremia and seizure-like activity . Chest x-ray . Trend WBC . Hold off on antibiotics for now  DVT prophylaxis: Lovenox   Code Status: Partial Code  Diet: Heart healthy Family Communication: Daughter updated at bedside Disposition Plan: Pending medical stability  Consultants  . Neurology . Nephrology  Procedures  . EEG  Antibiotics  None    Subjective   Patient was examined at bedside in no acute distress.  Daughter at bedside as well.  During the exam while patient was telling me an extensive story with tangential thought she suddenly had difficulty getting words out then had a staring spell followed by aphasic speech.  This lasted 1 to 2 minutes.  She then was  able to have full train of thought once again several minutes later.  Patient described the episode as being able to think about what she wanted to say but unable to actually get her words out.  Patient and daughter reports she has not been drinking alcohol for the past 2 weeks but typically drinks about a half of 1/5 of liquor daily.  Initially she had stopped drinking alcohol however the past year she has had many stressors in her life including moving away of her daughter as well as the passing of her father from COVID-71 amongst other stressors which has led her to increase her alcohol consumption.  Denies any fever, chills, chest pain, shortness of breath, nausea, vomiting.  Objective   Vitals:   12/10/18 1025 12/10/18 1225 12/10/18 1513 12/10/18 1713  BP:      Pulse:      Resp: 16 18    Temp: 98.5 F (36.9 C) 98.2 F (36.8 C) 98.2 F (36.8 C) 98.2 F (36.8 C)  TempSrc: Oral Oral Oral Oral  SpO2:      Weight:      Height:        Intake/Output Summary (Last 24 hours) at 12/10/2018 1907 Last data filed at 12/10/2018 0904 Gross per 24 hour  Intake 730 ml  Output 600 ml  Net 130 ml   Filed Weights   12/09/18 1814  Weight: 68 kg    Examination:  Physical Exam Vitals signs and nursing note reviewed.  Constitutional:      Appearance: Normal appearance.  HENT:     Head: Normocephalic and atraumatic.     Nose: Nose normal.     Mouth/Throat:     Mouth: Mucous membranes are moist.  Eyes:     Extraocular Movements: Extraocular movements intact.  Neck:     Musculoskeletal: Normal range of motion. No neck rigidity.  Cardiovascular:     Rate and Rhythm: Normal rate and regular rhythm.  Pulmonary:     Effort: Pulmonary effort is normal.     Breath sounds: Normal breath sounds.  Abdominal:     General: Abdomen is flat.     Palpations: Abdomen is soft.  Musculoskeletal: Normal range of motion.        General: No swelling.  Neurological:     Mental Status: She is alert.      Comments: During the exam while patient was telling me an extensive story with tangential thought she suddenly had difficulty getting words out then had a staring spell followed by aphasic speech.    Following this patient was AO x3 and without any other focal deficit  Psychiatric:        Mood and Affect: Mood normal.        Behavior: Behavior normal.     Data Reviewed: I have personally reviewed following labs and imaging studies  CBC: Recent Labs  Lab 12/09/18 1910  WBC 12.7*  NEUTROABS 9.0*  HGB 13.1  HCT 37.4  MCV 94.7  PLT [id number]*   Basic Metabolic Panel: Recent Labs  Lab 12/09/18 1910 12/09/18 2213 12/10/18 0211 12/10/18 0812 12/10/18 1536  NA 121* 125* 128* 126* 122*  K 3.7 3.8 3.4* 3.6 3.5  CL 87*  92* 94* 95* 89*  CO2 22 20* 22 22 21*  GLUCOSE 122* 106* 125* 108* 98  BUN 12 9 7* 6* 9  CREATININE 0.60 0.60 0.66 0.54 0.66  CALCIUM 9.1 8.8* 8.5* 8.5* 8.6*  MG  --   --   --  1.7  --   PHOS  --   --   --  3.8  --    GFR: Estimated Creatinine Clearance: 57.9 mL/min (by C-G formula based on SCr of 0.66 mg/dL). Liver Function Tests: Recent Labs  Lab 12/09/18 1910  AST 24  ALT 19  ALKPHOS 146*  BILITOT 0.7  PROT 7.4  ALBUMIN 3.8   No results for input(s): LIPASE, AMYLASE in the last 168 hours. No results for input(s): AMMONIA in the last 168 hours. Coagulation Profile: Recent Labs  Lab 12/09/18 2213  INR 1.0   Cardiac Enzymes: No results for input(s): CKTOTAL, CKMB, CKMBINDEX, TROPONINI in the last 168 hours. BNP (last 3 results) No results for input(s): PROBNP in the last 8760 hours. HbA1C: No results for input(s): HGBA1C in the last 72 hours. CBG: No results for input(s): GLUCAP in the last 168 hours. Lipid Profile: Recent Labs    12/10/18 0211  CHOL 157  HDL 59  LDLCALC 88  TRIG 51  CHOLHDL 2.7   Thyroid Function Tests: Recent Labs    12/10/18 0211  TSH 0.974   Anemia Panel: No results for input(s): VITAMINB12, FOLATE,  FERRITIN, TIBC, IRON, RETICCTPCT in the last 72 hours. Sepsis Labs: No results for input(s): PROCALCITON, LATICACIDVEN in the last 168 hours.  Recent Results (from the past 240 hour(s))  SARS CORONAVIRUS 2 (TAT 6-24 HRS) Nasopharyngeal Nasopharyngeal Swab     Status: None   Collection Time: 12/09/18 11:29 PM   Specimen: Nasopharyngeal Swab  Result Value Ref Range Status   SARS Coronavirus 2 NEGATIVE NEGATIVE Final    Comment: (NOTE) SARS-CoV-2 target nucleic acids are NOT DETECTED. The SARS-CoV-2 RNA is generally detectable in upper and lower respiratory specimens during the acute phase of infection. Negative results do not preclude SARS-CoV-2 infection, do not rule out co-infections with other pathogens, and should not be used as the sole basis for treatment or other patient management decisions. Negative results must be combined with clinical observations, patient history, and epidemiological information. The expected result is Negative. Fact Sheet for Patients: SugarRoll.be Fact Sheet for Healthcare Providers: https://www.woods-mathews.com/ This test is not yet approved or cleared by the [place] FDA and  has been authorized for detection and/or diagnosis of SARS-CoV-2 by FDA under an Emergency Use Authorization (EUA). This EUA will remain  in effect (meaning this test can be used) for the duration of the COVID-19 declaration under Section 56 4(b)(1) of the Act, 21 U.S.C. section 360bbb-3(b)(1), unless the authorization is terminated or revoked sooner. Performed at [facility], [place] [address], [place], [place] [address]          Radiology Studies: Ct Angio Head W Or Wo Contrast  Result Date: 12/09/2018 CLINICAL DATA:  Initial evaluation for acute speech difficulty. Left-sided weakness. EXAM: CT ANGIOGRAPHY HEAD AND NECK TECHNIQUE: Multidetector CT imaging of the head and neck was performed using the standard protocol  during bolus administration of intravenous contrast. Multiplanar CT image reconstructions and MIPs were obtained to evaluate the vascular anatomy. Carotid stenosis measurements (when applicable) are obtained utilizing NASCET criteria, using the distal internal carotid diameter as the denominator. CONTRAST:  58mL OMNIPAQUE IOHEXOL 350 MG/ML SOLN COMPARISON:  Prior  head CT from earlier the same day. FINDINGS: CT HEAD FINDINGS Brain: Examination somewhat degraded by motion artifact. Age-related cerebral atrophy with moderate chronic microvascular ischemic disease. No acute intracranial hemorrhage. No acute large vessel territory infarct. No mass lesion, midline shift or mass effect. No hydrocephalus. No extra-axial fluid collection. Vascular: No hyperdense vessel. Scattered vascular calcifications noted within the carotid siphons. Skull: Scalp soft tissues and calvarium within normal limits. Sinuses: Chronic left sphenoid sinusitis noted. Visualized paranasal sinuses are otherwise clear. No mastoid effusion. Orbits: Globes and orbital soft tissues demonstrate no acute finding. Review of the MIP images confirms the above findings CTA NECK FINDINGS Aortic arch: Visualized aortic arch normal in caliber with normal branch pattern. Moderate atherosclerotic change about the aortic arch and origin of the great vessels without hemodynamically significant stenosis. Visualized subclavian arteries widely patent. Right carotid system: Right CCA tortuous proximally but widely patent to the bifurcation without stenosis. No significant atheromatous narrowing about the right bifurcation. Focal pseudoaneurysm extending from the medial aspect of the mid-distal right ICA measures 7 x 4 x 6 mm (series 11, image 200). No significant intraluminal thrombus or intimal irregularity. Right ICA otherwise widely patent without stenosis or other acute abnormality. Left carotid system: Left CCA tortuous proximally but widely patent to the  bifurcation without stenosis. No significant atheromatous narrowing about the left bifurcation. Left ICA widely patent distally to the skull base without stenosis, dissection, or occlusion. Vertebral arteries: Both vertebral arteries arise from the subclavian arteries. Mixed plaque at the origin of the left vertebral artery with associated stenosis of up to approximately 50%. Additional moderate multifocal areas of narrowing within the right V2 segment at the levels of C5 and C3, and on the left at C1 related to intrinsic compression from uncovertebral disease. Vertebral arteries otherwise patent without stenosis or vascular occlusion. Skeleton: No acute osseous abnormality. No discrete lytic or blastic osseous lesions. Moderate multilevel cervical spondylosis at C4-5 through C6-7, with ankylosis of the C4 and C5 vertebral bodies. Prominent degenerative changes about the C1-2 articulation. Other neck: No other acute soft tissue abnormality within the neck. No mass lesion or adenopathy. Subcentimeter hypodense nodule within the inferior right thyroid lobe noted, of doubtful significance given size. Upper chest: Moderate to advanced upper lobe predominant emphysema. Visualized upper chest demonstrates no acute finding. Review of the MIP images confirms the above findings CTA HEAD FINDINGS Anterior circulation: Petrous segments patent bilaterally. Scattered atheromatous plaque within the cavernous/supraclinoid ICAs with secondary mild to moderate multifocal narrowing. No hemodynamically significant stenosis. ICA termini well perfused. A1 segments patent bilaterally. Right A1 hypoplastic, accounting for the slightly diminutive right ICA is compared to the left. Normal anterior communicating artery complex. Anterior cerebral arteries patent to their distal aspects without stenosis. Right M1 widely patent. Normal right MCA bifurcation. On coronal sequence, there appears to be focal occlusion of a proximal right M2 or  possibly M3 branch at the base of the right sylvian fissure (series 12, image 88). a this is difficult to see on corresponding axial and sagittal sequence. Distal right MCA branches otherwise well perfused. Left M1 segment widely patent. Normal left MCA bifurcation. Distal left MCA branches well perfused and fairly symmetric with the right. Posterior circulation: Vertebral arteries widely patent to the vertebrobasilar junction without stenosis. Both PICA is patent bilaterally. 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 Basilar widely patent to its distal aspect. Superior cerebral arteries patent bilaterally. Both PCAs primarily supplied via the basilar and are well perfused to their distal  aspects without stenosis. Venous sinuses: Grossly patent allowing for timing the contrast bolus. Poor filling of the left sigmoid sinus and proximal internal jugular vein felt to be related to timing of the contrast bolus. No hyperdensity seen within this region on corresponding noncontrast head CT. Anatomic variants: Hypoplastic right A1 segment. No intracranial aneurysm. Review of the MIP images confirms the above findings IMPRESSION: 1. Focal occlusion of a proximal right M2/M3 branch at the base of the right sylvian fissure as detailed above, concerning for LVO. 2. 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 No significant intraluminal thrombus or intimal irregularity identified. 3. Mild to moderate atherosclerotic change about the aortic arch and carotid siphons without hemodynamically significant stenosis. 4.  Emphysema (ICD10-J43.9). Critical Value/emergent results were called by telephone at the time of interpretation on 12/09/2018 at 10:20 pm to providerDr. Rory Percy, Who verbally acknowledged these results. Electronically Signed   By: Jeannine Boga M.D.   On: 12/09/2018 22:38   Ct Head Wo Contrast  Result Date:  12/09/2018 CLINICAL DATA:  73 year old female with history of speech difficulty for the past day. EXAM: CT HEAD WITHOUT CONTRAST TECHNIQUE: Contiguous axial images were obtained from the base of the skull through the vertex without intravenous contrast. COMPARISON:  Head CT 11/12/2017. FINDINGS: Brain: In the inferior aspects of the frontal lobes of the brain bilaterally (right greater than left) there are poorly find areas of low attenuation and disruption of normal gray-white differentiation, which appear slightly more conspicuous than prior study 11/11/2017. Patchy and confluent areas of decreased attenuation are noted throughout the deep and periventricular white matter of the cerebral hemispheres bilaterally, compatible with chronic microvascular ischemic disease. No evidence of acute infarction, hemorrhage, hydrocephalus, extra-axial collection or mass lesion/mass effect. Vascular: No hyperdense vessel or unexpected calcification. Skull: Normal. Negative for fracture or focal lesion. Sinuses/Orbits: No acute finding. Other: None. IMPRESSION: 1. Ill-defined areas of low attenuation in the frontal lobes bilaterally (right greater than left) with loss of gray-white differentiation, increased compared to prior study 11/11/2017. Areas of age-indeterminate ischemia in these regions are not excluded. If there is clinical concern for acute ischemia, further evaluation with brain MRI could be considered. 2. Extensive chronic microvascular ischemic changes throughout the cerebral white matter redemonstrated. Critical Value/emergent results were called by telephone at the time of interpretation on 12/09/2018 at 8:06 pm to [place] , who verbally acknowledged these results. Electronically Signed   By: Vinnie Langton M.D.   On: 12/09/2018 20:06   Ct Angio Neck W And/or Wo Contrast  Result Date: 12/09/2018 CLINICAL DATA:  Initial evaluation for acute speech difficulty. Left-sided weakness. EXAM: CT  ANGIOGRAPHY HEAD AND NECK TECHNIQUE: Multidetector CT imaging of the head and neck was performed using the standard protocol during bolus administration of intravenous contrast. Multiplanar CT image reconstructions and MIPs were obtained to evaluate the vascular anatomy. Carotid stenosis measurements (when applicable) are obtained utilizing NASCET criteria, using the distal internal carotid diameter as the denominator. CONTRAST:  42mL OMNIPAQUE IOHEXOL 350 MG/ML SOLN COMPARISON:  Prior head CT from earlier the same day. FINDINGS: CT HEAD FINDINGS Brain: Examination somewhat degraded by motion artifact. Age-related cerebral atrophy with moderate chronic microvascular ischemic disease. No acute intracranial hemorrhage. No acute large vessel territory infarct. No mass lesion, midline shift or mass effect. No hydrocephalus. No extra-axial fluid collection. Vascular: No hyperdense vessel. Scattered vascular calcifications noted within the carotid siphons. Skull: Scalp soft tissues  and calvarium within normal limits. Sinuses: Chronic left sphenoid sinusitis noted. Visualized paranasal sinuses are otherwise clear. No mastoid effusion. Orbits: Globes and orbital soft tissues demonstrate no acute finding. Review of the MIP images confirms the above findings CTA NECK FINDINGS Aortic arch: Visualized aortic arch normal in caliber with normal branch pattern. Moderate atherosclerotic change about the aortic arch and origin of the great vessels without hemodynamically significant stenosis. Visualized subclavian arteries widely patent. Right carotid system: Right CCA tortuous proximally but widely patent to the bifurcation without stenosis. No significant atheromatous narrowing about the right bifurcation. Focal pseudoaneurysm extending from the medial aspect of the mid-distal right ICA measures 7 x 4 x 6 mm (series 11, image 200). No significant intraluminal thrombus or intimal irregularity. Right ICA otherwise widely patent  without stenosis or other acute abnormality. Left carotid system: Left CCA tortuous proximally but widely patent to the bifurcation without stenosis. No significant atheromatous narrowing about the left bifurcation. Left ICA widely patent distally to the skull base without stenosis, dissection, or occlusion. Vertebral arteries: Both vertebral arteries arise from the subclavian arteries. Mixed plaque at the origin of the left vertebral artery with associated stenosis of up to approximately 50%. Additional moderate multifocal areas of narrowing within the right V2 segment at the levels of C5 and C3, and on the left at C1 related to intrinsic compression from uncovertebral disease. Vertebral arteries otherwise patent without stenosis or vascular occlusion. Skeleton: No acute osseous abnormality. No discrete lytic or blastic osseous lesions. Moderate multilevel cervical spondylosis at C4-5 through C6-7, with ankylosis of the C4 and C5 vertebral bodies. Prominent degenerative changes about the C1-2 articulation. Other neck: No other acute soft tissue abnormality within the neck. No mass lesion or adenopathy. Subcentimeter hypodense nodule within the inferior right thyroid lobe noted, of doubtful significance given size. Upper chest: Moderate to advanced upper lobe predominant emphysema. Visualized upper chest demonstrates no acute finding. Review of the MIP images confirms the above findings CTA HEAD FINDINGS Anterior circulation: Petrous segments patent bilaterally. Scattered atheromatous plaque within the cavernous/supraclinoid ICAs with secondary mild to moderate multifocal narrowing. No hemodynamically significant stenosis. ICA termini well perfused. A1 segments patent bilaterally. Right A1 hypoplastic, accounting for the slightly diminutive right ICA is compared to the left. Normal anterior communicating artery complex. Anterior cerebral arteries patent to their distal aspects without stenosis. Right M1 widely  patent. Normal right MCA bifurcation. On coronal sequence, there appears to be focal occlusion of a proximal right M2 or possibly M3 branch at the base of the right sylvian fissure (series 12, image 88). a this is difficult to see on corresponding axial and sagittal sequence. Distal right MCA branches otherwise well perfused. Left M1 segment widely patent. Normal left MCA bifurcation. Distal left MCA branches well perfused and fairly symmetric with the right. Posterior circulation: Vertebral arteries widely patent to the vertebrobasilar junction without stenosis. Both PICA is patent bilaterally. 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 Basilar widely patent to its distal aspect. Superior cerebral arteries patent bilaterally. Both PCAs primarily supplied via the basilar and are well perfused to their distal aspects without stenosis. Venous sinuses: Grossly patent allowing for timing the contrast bolus. Poor filling of the left sigmoid sinus and proximal internal jugular vein felt to be related to timing of the contrast bolus. No hyperdensity seen within this region on corresponding noncontrast head CT. Anatomic variants: Hypoplastic right A1 segment. No intracranial aneurysm. Review of the MIP images confirms the above findings IMPRESSION: 1. Focal  occlusion of a proximal right M2/M3 branch at the base of the right sylvian fissure as detailed above, concerning for LVO. 2. 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 No significant intraluminal thrombus or intimal irregularity identified. 3. Mild to moderate atherosclerotic change about the aortic arch and carotid siphons without hemodynamically significant stenosis. 4.  Emphysema (ICD10-J43.9). Critical Value/emergent results were called by telephone at the time of interpretation on 12/09/2018 at 10:20 pm to providerDr. Rory Percy, Who verbally acknowledged these results.  Electronically Signed   By: Jeannine Boga M.D.   On: 12/09/2018 22:38   Mr Brain Wo Contrast  Result Date: 12/10/2018 CLINICAL DATA:  Speech difficulty. EXAM: MRI HEAD WITHOUT CONTRAST TECHNIQUE: Multiplanar, multiecho pulse sequences of the brain and surrounding structures were obtained without intravenous contrast. COMPARISON:  CT and CTA from yesterday FINDINGS: Brain: No acute infarction, hemorrhage, hydrocephalus, extra-axial collection or mass lesion. 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 Chronic small vessel ischemic gliosis has become more confluent in the deep cerebral white matter. Stable age normal brain volume. Vascular: Preserved flow voids.  There was CTA yesterday Skull and upper cervical spine: Normal marrow signal. Cervical facet spurring with C3-4 anterolisthesis. Sinuses/Orbits: Bilateral cataract resection Other: Overall mild intermittent motion degradation IMPRESSION: 1. No acute finding. 2. Moderate chronic small vessel ischemia, including remote small vessel infarcts, that has progressed from 2013. Electronically Signed   By: Monte Fantasia M.D.   On: 12/10/2018 04:18   Ct Cerebral Perfusion W Contrast  Result Date: 12/09/2018 CLINICAL DATA:  Initial evaluation for acute aphasia, left-sided weakness. EXAM: CT PERFUSION BRAIN TECHNIQUE: Multiphase CT imaging of the brain was performed following IV bolus contrast injection. Subsequent parametric perfusion maps were calculated using RAPID software. CONTRAST:  24mL OMNIPAQUE IOHEXOL 350 MG/ML SOLN COMPARISON:  Prior CT and CTA from earlier same day. FINDINGS: CT Brain Perfusion Findings: CBF (<30%) Volume: 70mL Perfusion (Tmax>6.0s) volume: 11mL Mismatch Volume: 59mL ASPECTS on noncontrast CT Head: No aspects score provided. Infarct Core: 0 mL Infarction Location:Negative CT perfusion for acute core infarct or perfusion deficit. Upon  further review of prior CTA, previously noted possible right M2/M3 occlusion felt to most likely reflect focal vascular tortuosity with associated stenosis. Attenuated but patent flow is seen distally. IMPRESSION: 1. Negative CT perfusion for acute core infarct or perfusion abnormality. 2. Upon further consideration and in conjunction with these perfusion findings, previously questioned right M2/M3 occlusion felt to be most consistent with focal vascular tortuosity rather than occlusion. These results were communicated to Dr. Rory Percy at 10:52 pmon 10/18/2020by text page via the [facility] messaging system. Electronically Signed   By: Jeannine Boga M.D.   On: 12/09/2018 22:54        Scheduled Meds: . amLODipine  5 mg Oral Daily  . aspirin EC  81 mg Oral Daily  . cloNIDine  0.1 mg Oral BID  . enoxaparin (LOVENOX) injection  40 mg Subcutaneous Q24H  . feeding supplement (ENSURE ENLIVE)  237 mL Oral BID BM  . folic acid  1 mg Oral Daily  . letrozole  2.5 mg Oral Daily  . loratadine  10 mg Oral Daily  . LORazepam  0-4 mg Intravenous Q6H   Followed by  . [START ON 12/12/2018] LORazepam  0-4 mg Intravenous Q12H  . methylphenidate  20 mg Oral Daily  . metoprolol succinate  100 mg Oral Daily  . multivitamin with minerals  1 tablet Oral Daily  . nicotine  21 mg Transdermal Daily  . [START ON 12/11/2018] oxybutynin  10 mg Oral Daily  . pantoprazole  40 mg Oral Daily  . senna-docusate  1 tablet Oral QHS  . thiamine  100 mg Oral Daily   Or  . thiamine  100 mg Intravenous Daily   Continuous Infusions: . sodium chloride 75 mL/hr at 12/10/18 1805     LOS: 1 day    Time spent: 58 minutes    Harold Hedge, DO [facility] Pager [phone]  If 7PM-7AM, please contact night-coverage [email or web address] Password [email or web address] 12/10/2018, 7:07 PM

## 2018-12-10 NOTE — Consult Note (Signed)
Reason for Consult: Hyponatremia Referring Physician: Yesennia Young is an 73 y.o. female with past medical history significant for hypertension, anxiety/depression-on Pristiq- fairly new start, history of breast cancer, alcohol and tobacco abuse, and also may be some chronic pain on chronic narcotics and muscle relaxers as well as Xanax.  She was brought to the emergency room last night with difficulty speaking and then a possible seizure episode.  Initial sodium level was 121 (patient does seem to have some chronic hyponatremia-in 2013 had a very severe episode, then a sodium in the system from 2017 of 133, sodium of 126 on 11/24/2018 which improved to 135 on 10/4) her hyponatremia at this time was felt due to potomania due to alcohol abuse and poor oral intake.  She and her daughter both deny alcohol use but she does seem to have some polydipsia tendencies.  She was given normal saline in the emergency room with correction from 121-125, then to128 over 7 hours- then it was paused and resumed several hours later.  Latest sodium 126 at 8 AM.  Neurology saw patient, she had an EEG which was felt to be normal.   MRI with small vessel changes.  TSH was within normal limits.  Urine osmolality is appropriately low at 181 with urine sodium 33.     Trend in Creatinine: Creatinine  Date/Time Value Ref Range Status  10/25/2017 12:30 PM 0.84 0.44 - 1.00 mg/dL Final   Creatinine, Ser  Date/Time Value Ref Range Status  12/10/2018 08:12 AM 0.54 0.44 - 1.00 mg/dL Final  12/10/2018 02:11 AM 0.66 0.44 - 1.00 mg/dL Final  12/09/2018 10:13 PM 0.60 0.44 - 1.00 mg/dL Final  12/09/2018 07:10 PM 0.60 0.44 - 1.00 mg/dL Final  11/25/2018 07:08 AM 0.56 0.44 - 1.00 mg/dL Final  11/24/2018 11:56 AM 0.56 0.44 - 1.00 mg/dL Final  11/11/2017 11:38 PM 0.83 0.44 - 1.00 mg/dL Final  04/10/2015 04:15 PM 0.73 0.44 - 1.00 mg/dL Final  07/03/2011 03:05 PM 0.67 0.50 - 1.10 mg/dL Final  07/03/2011 10:05 AM 0.67 0.50 - 1.10  mg/dL Final  07/03/2011 03:33 AM 0.58 0.50 - 1.10 mg/dL Final  07/02/2011 09:13 PM 0.55 0.50 - 1.10 mg/dL Final  07/02/2011 03:15 PM 0.63 0.50 - 1.10 mg/dL Final  07/02/2011 08:57 AM 0.55 0.50 - 1.10 mg/dL Final  07/02/2011 03:05 AM 0.64 0.50 - 1.10 mg/dL Final  07/01/2011 09:50 PM 0.56 0.50 - 1.10 mg/dL Final  07/01/2011 03:05 PM 0.62 0.50 - 1.10 mg/dL Final  07/01/2011 09:50 AM 0.63 0.50 - 1.10 mg/dL Final  07/01/2011 03:20 AM 0.60 0.50 - 1.10 mg/dL Final  06/30/2011 09:55 PM 0.60 0.50 - 1.10 mg/dL Final  06/30/2011 04:20 PM 0.53 0.50 - 1.10 mg/dL Final  06/30/2011 09:40 AM 0.54 0.50 - 1.10 mg/dL Final  06/30/2011 03:25 AM 0.58 0.50 - 1.10 mg/dL Final  06/29/2011 09:46 PM 0.58 0.50 - 1.10 mg/dL Final  06/29/2011 02:52 PM 0.62 0.50 - 1.10 mg/dL Final  06/29/2011 09:10 AM 0.55 0.50 - 1.10 mg/dL Final  06/29/2011 03:25 AM 0.54 0.50 - 1.10 mg/dL Final  06/28/2011 09:07 PM 0.58 0.50 - 1.10 mg/dL Final  06/28/2011 05:00 PM 0.65 0.50 - 1.10 mg/dL Final  06/28/2011 09:15 AM 0.53 0.50 - 1.10 mg/dL Final  06/28/2011 03:55 AM 0.58 0.50 - 1.10 mg/dL Final  06/27/2011 09:35 PM 0.60 0.50 - 1.10 mg/dL Final  06/27/2011 04:00 PM 0.60 0.50 - 1.10 mg/dL Final  06/27/2011 11:23 AM 0.58 0.50 - 1.10 mg/dL Final  06/27/2011  09:30 AM 0.57 0.50 - 1.10 mg/dL Final  06/26/2011 06:01 AM 0.65 0.50 - 1.10 mg/dL Final  06/26/2011 12:50 AM 0.65 0.50 - 1.10 mg/dL Final  06/25/2011 07:45 PM 0.63 0.50 - 1.10 mg/dL Final  03/25/2008 09:54 AM 0.74 0.4 - 1.2 mg/dL Final   Sodium  Date/Time Value Ref Range Status  12/10/2018 08:12 AM 126 (L) 135 - 145 mmol/L Final  12/10/2018 02:11 AM 128 (L) 135 - 145 mmol/L Final  12/09/2018 10:13 PM 125 (L) 135 - 145 mmol/L Final  12/09/2018 07:10 PM 121 (L) 135 - 145 mmol/L Final  11/25/2018 07:08 AM 135 135 - 145 mmol/L Final  11/24/2018 11:56 AM 126 (L) 135 - 145 mmol/L Final  11/11/2017 11:38 PM 136 135 - 145 mmol/L Final  10/25/2017 12:30 PM 138 135 - 145 mmol/L Final   04/10/2015 04:15 PM 133 (L) 135 - 145 mmol/L Final  07/03/2011 03:05 PM 125 (L) 135 - 145 mEq/L Final  07/03/2011 10:05 AM 127 (L) 135 - 145 mEq/L Final  07/03/2011 03:33 AM 127 (L) 135 - 145 mEq/L Final  07/02/2011 09:13 PM 123 (L) 135 - 145 mEq/L Final  07/02/2011 03:15 PM 124 (L) 135 - 145 mEq/L Final  07/02/2011 08:57 AM 126 (L) 135 - 145 mEq/L Final  07/02/2011 03:05 AM 126 (L) 135 - 145 mEq/L Final  07/01/2011 09:50 PM 122 (L) 135 - 145 mEq/L Final  07/01/2011 03:05 PM 125 (L) 135 - 145 mEq/L Final  07/01/2011 09:50 AM 121 (L) 135 - 145 mEq/L Final  07/01/2011 03:20 AM 120 (L) 135 - 145 mEq/L Final  06/30/2011 09:55 PM 115 (LL) 135 - 145 mEq/L Final    Comment:    RESULT REPEATED AND VERIFIED CRITICAL RESULT CALLED TO, READ BACK BY AND VERIFIED WITH: T RYAN RN AT 2300 06/30/2011 BY R LINEBERRY  06/30/2011 04:20 PM 116 (LL) 135 - 145 mEq/L Final    Comment:    RESULT REPEATED AND VERIFIED CRITICAL RESULT CALLED TO, READ BACK BY AND VERIFIED WITH: FERRERO, A. AT 1731 ON 06/30/11 BY HOBBINS, J.  06/30/2011 09:40 AM 119 (LL) 135 - 145 mEq/L Final    Comment:    REPEATED TO VERIFY CRITICAL RESULT CALLED TO, READ BACK BY AND VERIFIED WITH: EDGAR,E. RN AT 1020 ON 06/30/11 BY GILLESPIE,B.  06/30/2011 03:25 AM 117 (LL) 135 - 145 mEq/L Final    Comment:    REPEATED TO VERIFY CRITICAL RESULT CALLED TO, READ BACK BY AND VERIFIED WITH: RYAN,T AT 0420 ON [id number] BY POTEAT,S  06/29/2011 09:46 PM 116 (LL) 135 - 145 mEq/L Final    Comment:    REPEATED TO VERIFY CRITICAL RESULT CALLED TO, READ BACK BY AND VERIFIED WITH: RYAN,T AT 2318 ON [id number] BY POTEAT,S  06/29/2011 02:52 PM 116 (LL) 135 - 145 mEq/L Final    Comment:    REPEATED TO VERIFY CRITICAL RESULT CALLED TO, READ BACK BY AND VERIFIED WITH: EDGALE/[id number]/MURPHYD  06/29/2011 09:10 AM 119 (LL) 135 - 145 mEq/L Final    Comment:    REPEATED TO VERIFY CRITICAL RESULT CALLED TO, READ BACK BY AND VERIFIED WITH: EDGAR,E. RN AT  1008 ON 06/29/11 BY GILLESPIE,B.  06/29/2011 03:25 AM 115 (LL) 135 - 145 mEq/L Final    Comment:    REPEATED TO VERIFY CRITICAL RESULT CALLED TO, READ BACK BY AND VERIFIED WITH: C.SHELTON RN AT 0425 ON [id number] BY DLONG  06/28/2011 09:07 PM 117 (LL) 135 - 145 mEq/L Final    Comment:  REPEATED TO VERIFY CRITICAL RESULT CALLED TO, READ BACK BY AND VERIFIED WITH: C.SHELTON AT 2145 ON [id number] BY DLONG  06/28/2011 05:00 PM 115 (LL) 135 - 145 mEq/L Final    Comment:    REPEATED TO VERIFY CRITICAL RESULT CALLED TO, READ BACK BY AND VERIFIED WITH: K KNIGHT RN 1805 06/28/11 A NAVARRO  06/28/2011 09:15 AM 120 (L) 135 - 145 mEq/L Final  06/28/2011 03:55 AM 117 (LL) 135 - 145 mEq/L Final    Comment:    REPEATED TO VERIFY CRITICAL RESULT CALLED TO, READ BACK BY AND VERIFIED WITH: RYAN,T/5E @0442  ON 06/28/11 BY KARCZEWSKI,S.  06/27/2011 09:35 PM 116 (LL) 135 - 145 mEq/L Final    Comment:    REPEATED TO VERIFY CRITICAL RESULT CALLED TO, READ BACK BY AND VERIFIED WITH: RYAN,T/5E @2220  ON 06/27/11 BY KARCZEWSKI,S.  06/27/2011 04:00 PM 116 (LL) 135 - 145 mEq/L Final    Comment:    RESULT REPEATED AND VERIFIED CRITICAL RESULT CALLED TO, READ BACK BY AND VERIFIED WITH: WHITE,C. AT 1641 ON [id number] BY LOVE,T.  06/27/2011 11:23 AM 113 (LL) 135 - 145 mEq/L Final    Comment:    REPEATED TO VERIFY CRITICAL RESULT CALLED TO, READ BACK BY AND VERIFIED WITH: MCCLAIN,D. RN AT 1215 ON 06/27/11 BY GILLESPIE,B.  06/27/2011 09:30 AM 110 (LL) 135 - 145 mEq/L Final    Comment:    DELTA CHECK NOTED REPEATED TO VERIFY CRITICAL RESULT CALLED TO, READ BACK BY AND VERIFIED WITH: MCCLAIN,D. RN AT 1055 ON 06/27/11 BY GILLESPIE,B.  06/26/2011 06:01 AM 124 (L) 135 - 145 mEq/L Final  06/26/2011 12:50 AM 120 (L) 135 - 145 mEq/L Final    Comment:    DELTA CHECK NOTED REPEATED TO VERIFY  06/25/2011 07:45 PM 114 (LL) 135 - 145 mEq/L Final    Comment:    REPEATED TO VERIFY CRITICAL RESULT CALLED TO, READ BACK BY AND  VERIFIED WITH: ANDERSON,S/ED @2047  ON 06/25/11 BY KARCZEWSKI,S.  03/25/2008 09:54 AM 129 (L) 135 - 145 mEq/L Final   PMH:   Past Medical History:  Diagnosis Date  . Allergy   . Anemia 06/29/2011  . Anxiety   . Arthritis   . Breast cancer ([place])    right breast  . C1 cervical fracture ([place]) 02/1997  . Cancer ([place])    melanoma  . Depression   . Family history of breast cancer   . Family history of prostate cancer   . GERD (gastroesophageal reflux disease)   . Hypertension   . Tardive dyskinesia     PSH:   Past Surgical History:  Procedure Laterality Date  . ABDOMINAL HYSTERECTOMY    . ASPIRATION OF ABSCESS Right 11/20/2017   Procedure: ASPIRATION OF RIGHT AXILLARY SEROMA;  Surgeon: Rolm Bookbinder, MD;  Location: [facility];  Service: General;  Laterality: Right;  . AUGMENTATION MAMMAPLASTY Bilateral    biateral implants , approx 2015  . BREAST LUMPECTOMY Right 11/02/2017   re-ex 11-20-17  . BREAST LUMPECTOMY WITH RADIOACTIVE SEED AND SENTINEL LYMPH NODE BIOPSY Right 11/02/2017   Procedure: BREAST LUMPECTOMY WITH RADIOACTIVE SEED AND SENTINEL LYMPH NODE BIOPSY;  Surgeon: Rolm Bookbinder, MD;  Location: [facility];  Service: General;  Laterality: Right;  . CHOLECYSTECTOMY    . collarbone    . INCONTINENCE SURGERY    . KNEE SURGERY     removal of cyst, repair of cartiledge  . LAPAROSCOPY     for endometriosis  . RE-EXCISION OF BREAST LUMPECTOMY Right 11/20/2017  Procedure: RE-EXCISION OF RIGHT BREAST MARGINS;  Surgeon: Rolm Bookbinder, MD;  Location: [facility];  Service: General;  Laterality: Right;  . ROTATOR CUFF REPAIR     left    Allergies:  Allergies  Allergen Reactions  . Percocet [Oxycodone-Acetaminophen] Other (See Comments)    "bugs crawling on me"    Medications:   Prior to Admission medications   Medication Sig Start Date End Date Taking? Authorizing Provider  ALPRAZolam Duanne Moron) 0.5 MG tablet Take 0.5  mg by mouth 2 (two) times daily as needed for anxiety.  08/10/10  Yes [provider]  amLODipine (NORVASC) 5 MG tablet Take 5 mg by mouth daily.   Yes [provider]  cetirizine (ZYRTEC) 10 MG tablet Take 10 mg by mouth as needed for allergies.   Yes [provider]  cloNIDine (CATAPRES) 0.1 MG tablet Take 1 tablet by mouth 2 (two) times daily. 11/07/16  Yes [provider]  desvenlafaxine (PRISTIQ) 50 MG 24 hr tablet Take 50 mg by mouth daily.   Yes [provider]  HYDROcodone-acetaminophen (NORCO/VICODIN) 5-325 MG tablet Take 1 tablet by mouth every 6 (six) hours as needed for moderate pain. 12/06/18  Yes Garald Balding, MD  letrozole [facility]) 2.5 MG tablet TAKE 1 TABLET(2.5 MG) BY MOUTH DAILY Patient taking differently: Take 2.5 mg by mouth daily.  10/12/18  Yes Nicholas Lose, MD  methylphenidate (RITALIN) 20 MG tablet Take 20 mg by mouth daily.    Yes [provider]  metoprolol succinate (TOPROL-XL) 100 MG 24 hr tablet Take 100 mg by mouth daily.  02/19/18  Yes [provider]  omeprazole (PRILOSEC) 40 MG capsule TAKE 1 CAPSULE BY MOUTH TWICE DAILY 30 MINUTES BEFORE BREAKFAST AND 30 MINUTES BEFORE DINNER Patient taking differently: Take 40 mg by mouth 2 (two) times daily. 30 minutes before breakfast, and 30  minutes before dinner. 10/12/18  Yes Nandigam, Venia Minks, MD  oxybutynin (DITROPAN-XL) 10 MG 24 hr tablet Take 10 mg by mouth daily.  12/05/16  Yes [provider]  polyethylene glycol (MIRALAX / GLYCOLAX) 17 g packet Take 17 g by mouth daily as needed for moderate constipation. 11/26/18  Yes Oswald Hillock, MD  promethazine (PHENERGAN) 25 MG tablet Take 25 mg by mouth every 6 (six) hours as needed for nausea or vomiting.   Yes [provider]  HYDROcodone-acetaminophen (NORCO/VICODIN) 5-325 MG tablet Take 1-2 tablets by mouth every 6 (six) hours as needed for severe pain. Patient not taking: Reported on  12/08/2018 11/29/18   Cherylann Ratel, PA-C  methocarbamol (ROBAXIN) 500 MG tablet Take 1 tablet TID prn Patient not taking: Reported on 12/08/2018 12/06/18   Garald Balding, MD    Discontinued Meds:   Medications Discontinued During This Encounter  Medication Reason  . 0.9 %  sodium chloride infusion   . oxybutynin (DITROPAN-XL) 24 hr tablet 10 mg     Social History:  reports that she has been smoking cigarettes. She has been smoking about 1.00 pack per day. She has never used smokeless tobacco. She reports current alcohol use of about 21.0 standard drinks of alcohol per week. She reports that she does not use drugs.  Family History:   Family History  Problem Relation Age of Onset  . COPD Mother   . Prostate cancer Father 59       seed implant for treatment  . Colon polyps Father        'a few'  . Breast  cancer Sister 31  . Breast cancer Maternal Aunt        dx >50  . Breast cancer Other 35       bilateral  . Colon cancer Neg Hx     A comprehensive review of systems was negative except for: Neurological: positive for speech problems Behavioral/Psych: positive for anxiety and sleep disturbance  Blood pressure 116/61, pulse 98, temperature 98.2 F (36.8 C), temperature source Oral, resp. rate 18, height 5\' 3"  (1.6 m), weight 68 kg, SpO2 92 %. General appearance: alert, distracted, fatigued and slowed mentation Resp: clear to auscultation bilaterally Cardio: regular rate and rhythm, S1, S2 normal, no murmur, click, rub or gallop GI: soft, non-tender; bowel sounds normal; no masses,  no organomegaly Extremities: extremities normal, atraumatic, no cyanosis or edema Neurologic: Mental status: Alert, oriented, thought content appropriate, Distracted, anxious Drinking lots of water here in the hospital  Labs: Basic Metabolic Panel: Recent Labs  Lab 12/09/18 1910 12/09/18 2213 12/10/18 0211 12/10/18 0812  NA 121* 125* 128* 126*  K 3.7 3.8 3.4* 3.6  CL 87* 92* 94* 95*   CO2 22 20* 22 22  GLUCOSE 122* 106* 125* 108*  BUN 12 9 7* 6*  CREATININE 0.60 0.60 0.66 0.54  ALBUMIN 3.8  --   --   --   CALCIUM 9.1 8.8* 8.5* 8.5*  PHOS  --   --   --  3.8   Liver Function Tests: Recent Labs  Lab 12/09/18 1910  AST 24  ALT 19  ALKPHOS 146*  BILITOT 0.7  PROT 7.4  ALBUMIN 3.8   No results for input(s): LIPASE, AMYLASE in the last 168 hours. No results for input(s): AMMONIA in the last 168 hours. CBC: Recent Labs  Lab 12/09/18 1910  WBC 12.7*  NEUTROABS 9.0*  HGB 13.1  HCT 37.4  MCV 94.7  PLT 553*   PT/INR: @labrcntip (inr:5) Cardiac Enzymes: No results for input(s): CKTOTAL, CKMB, CKMBINDEX, TROPONINI in the last 168 hours. CBG: No results for input(s): GLUCAP in the last 168 hours.  Iron Studies: No results for input(s): IRON, TIBC, TRANSFERRIN, FERRITIN in the last 168 hours.  Xrays/Other Studies: Ct Angio Head W Or Wo Contrast  Result Date: 12/09/2018 CLINICAL DATA:  Initial evaluation for acute speech difficulty. Left-sided weakness. EXAM: CT ANGIOGRAPHY HEAD AND NECK TECHNIQUE: Multidetector CT imaging of the head and neck was performed using the standard protocol during bolus administration of intravenous contrast. Multiplanar CT image reconstructions and MIPs were obtained to evaluate the vascular anatomy. Carotid stenosis measurements (when applicable) are obtained utilizing NASCET criteria, using the distal internal carotid diameter as the denominator. CONTRAST:  56mL OMNIPAQUE IOHEXOL 350 MG/ML SOLN COMPARISON:  Prior head CT from earlier the same day. FINDINGS: CT HEAD FINDINGS Brain: Examination somewhat degraded by motion artifact. Age-related cerebral atrophy with moderate chronic microvascular ischemic disease. No acute intracranial hemorrhage. No acute large vessel territory infarct. No mass lesion, midline shift or mass effect. No hydrocephalus. No extra-axial fluid collection. Vascular: No hyperdense vessel. Scattered vascular  calcifications noted within the carotid siphons. Skull: Scalp soft tissues and calvarium within normal limits. Sinuses: Chronic left sphenoid sinusitis noted. Visualized paranasal sinuses are otherwise clear. No mastoid effusion. Orbits: Globes and orbital soft tissues demonstrate no acute finding. Review of the MIP images confirms the above findings CTA NECK FINDINGS Aortic arch: Visualized aortic arch normal in caliber with normal branch pattern. Moderate atherosclerotic change about the aortic arch and origin of the great vessels without hemodynamically significant  stenosis. Visualized subclavian arteries widely patent. Right carotid system: Right CCA tortuous proximally but widely patent to the bifurcation without stenosis. No significant atheromatous narrowing about the right bifurcation. Focal pseudoaneurysm extending from the medial aspect of the mid-distal right ICA measures 7 x 4 x 6 mm (series 11, image 200). No significant intraluminal thrombus or intimal irregularity. Right ICA otherwise widely patent without stenosis or other acute abnormality. Left carotid system: Left CCA tortuous proximally but widely patent to the bifurcation without stenosis. No significant atheromatous narrowing about the left bifurcation. Left ICA widely patent distally to the skull base without stenosis, dissection, or occlusion. Vertebral arteries: Both vertebral arteries arise from the subclavian arteries. Mixed plaque at the origin of the left vertebral artery with associated stenosis of up to approximately 50%. Additional moderate multifocal areas of narrowing within the right V2 segment at the levels of C5 and C3, and on the left at C1 related to intrinsic compression from uncovertebral disease. Vertebral arteries otherwise patent without stenosis or vascular occlusion. Skeleton: No acute osseous abnormality. No discrete lytic or blastic osseous lesions. Moderate multilevel cervical spondylosis at C4-5 through C6-7, with  ankylosis of the C4 and C5 vertebral bodies. Prominent degenerative changes about the C1-2 articulation. Other neck: No other acute soft tissue abnormality within the neck. No mass lesion or adenopathy. Subcentimeter hypodense nodule within the inferior right thyroid lobe noted, of doubtful significance given size. Upper chest: Moderate to advanced upper lobe predominant emphysema. Visualized upper chest demonstrates no acute finding. Review of the MIP images confirms the above findings CTA HEAD FINDINGS Anterior circulation: Petrous segments patent bilaterally. Scattered atheromatous plaque within the cavernous/supraclinoid ICAs with secondary mild to moderate multifocal narrowing. No hemodynamically significant stenosis. ICA termini well perfused. A1 segments patent bilaterally. Right A1 hypoplastic, accounting for the slightly diminutive right ICA is compared to the left. Normal anterior communicating artery complex. Anterior cerebral arteries patent to their distal aspects without stenosis. Right M1 widely patent. Normal right MCA bifurcation. On coronal sequence, there appears to be focal occlusion of a proximal right M2 or possibly M3 branch at the base of the right sylvian fissure (series 12, image 88). a this is difficult to see on corresponding axial and sagittal sequence. Distal right MCA branches otherwise well perfused. Left M1 segment widely patent. Normal left MCA bifurcation. Distal left MCA branches well perfused and fairly symmetric with the right. Posterior circulation: Vertebral arteries widely patent to the vertebrobasilar junction without stenosis. Both PICA is patent bilaterally. 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 Basilar widely patent to its distal aspect. Superior cerebral arteries patent bilaterally. Both PCAs primarily supplied via the basilar and are well perfused to their distal aspects without stenosis. Venous sinuses: Grossly patent allowing for timing the contrast  bolus. Poor filling of the left sigmoid sinus and proximal internal jugular vein felt to be related to timing of the contrast bolus. No hyperdensity seen within this region on corresponding noncontrast head CT. Anatomic variants: Hypoplastic right A1 segment. No intracranial aneurysm. Review of the MIP images confirms the above findings IMPRESSION: 1. Focal occlusion of a proximal right M2/M3 branch at the base of the right sylvian fissure as detailed above, concerning for LVO. 2. 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 No significant intraluminal thrombus or intimal irregularity identified. 3. Mild to moderate atherosclerotic change about the aortic arch and carotid siphons without hemodynamically significant stenosis. 4.  Emphysema (ICD10-J43.9). Critical Value/emergent results were called by telephone at the time of interpretation on 12/09/2018 at 10:20 pm to providerDr. Rory Percy, Who verbally acknowledged these results. Electronically Signed   By: Jeannine Boga M.D.   On: 12/09/2018 22:38   Ct Head Wo Contrast  Result Date: 12/09/2018 CLINICAL DATA:  73 year old female with history of speech difficulty for the past day. EXAM: CT HEAD WITHOUT CONTRAST TECHNIQUE: Contiguous axial images were obtained from the base of the skull through the vertex without intravenous contrast. COMPARISON:  Head CT 11/12/2017. FINDINGS: Brain: In the inferior aspects of the frontal lobes of the brain bilaterally (right greater than left) there are poorly find areas of low attenuation and disruption of normal gray-white differentiation, which appear slightly more conspicuous than prior study 11/11/2017. Patchy and confluent areas of decreased attenuation are noted throughout the deep and periventricular white matter of the cerebral hemispheres bilaterally, compatible with chronic microvascular ischemic disease. No evidence of acute  infarction, hemorrhage, hydrocephalus, extra-axial collection or mass lesion/mass effect. Vascular: No hyperdense vessel or unexpected calcification. Skull: Normal. Negative for fracture or focal lesion. Sinuses/Orbits: No acute finding. Other: None. IMPRESSION: 1. Ill-defined areas of low attenuation in the frontal lobes bilaterally (right greater than left) with loss of gray-white differentiation, increased compared to prior study 11/11/2017. Areas of age-indeterminate ischemia in these regions are not excluded. If there is clinical concern for acute ischemia, further evaluation with brain MRI could be considered. 2. Extensive chronic microvascular ischemic changes throughout the cerebral white matter redemonstrated. Critical Value/emergent results were called by telephone at the time of interpretation on 12/09/2018 at 8:06 pm to [place] , who verbally acknowledged these results. Electronically Signed   By: Vinnie Langton M.D.   On: 12/09/2018 20:06   Ct Angio Neck W And/or Wo Contrast  Result Date: 12/09/2018 CLINICAL DATA:  Initial evaluation for acute speech difficulty. Left-sided weakness. EXAM: CT ANGIOGRAPHY HEAD AND NECK TECHNIQUE: Multidetector CT imaging of the head and neck was performed using the standard protocol during bolus administration of intravenous contrast. Multiplanar CT image reconstructions and MIPs were obtained to evaluate the vascular anatomy. Carotid stenosis measurements (when applicable) are obtained utilizing NASCET criteria, using the distal internal carotid diameter as the denominator. CONTRAST:  79mL OMNIPAQUE IOHEXOL 350 MG/ML SOLN COMPARISON:  Prior head CT from earlier the same day. FINDINGS: CT HEAD FINDINGS Brain: Examination somewhat degraded by motion artifact. Age-related cerebral atrophy with moderate chronic microvascular ischemic disease. No acute intracranial hemorrhage. No acute large vessel territory infarct. No mass lesion, midline shift or mass  effect. No hydrocephalus. No extra-axial fluid collection. Vascular: No hyperdense vessel. Scattered vascular calcifications noted within the carotid siphons. Skull: Scalp soft tissues and calvarium within normal limits. Sinuses: Chronic left sphenoid sinusitis noted. Visualized paranasal sinuses are otherwise clear. No mastoid effusion. Orbits: Globes and orbital soft tissues demonstrate no acute finding. Review of the MIP images confirms the above findings CTA NECK FINDINGS Aortic arch: Visualized aortic arch normal in caliber with normal branch pattern. Moderate atherosclerotic change about the aortic arch and origin of the great vessels without hemodynamically significant stenosis. Visualized subclavian arteries widely patent. Right carotid system: Right CCA tortuous proximally but widely patent to the bifurcation without stenosis. No significant atheromatous narrowing about the right bifurcation. Focal pseudoaneurysm extending from the medial aspect of the mid-distal right ICA measures 7 x 4 x 6 mm (series 11, image 200). No significant intraluminal thrombus or intimal irregularity. Right ICA otherwise widely patent without stenosis or  other acute abnormality. Left carotid system: Left CCA tortuous proximally but widely patent to the bifurcation without stenosis. No significant atheromatous narrowing about the left bifurcation. Left ICA widely patent distally to the skull base without stenosis, dissection, or occlusion. Vertebral arteries: Both vertebral arteries arise from the subclavian arteries. Mixed plaque at the origin of the left vertebral artery with associated stenosis of up to approximately 50%. Additional moderate multifocal areas of narrowing within the right V2 segment at the levels of C5 and C3, and on the left at C1 related to intrinsic compression from uncovertebral disease. Vertebral arteries otherwise patent without stenosis or vascular occlusion. Skeleton: No acute osseous abnormality. No  discrete lytic or blastic osseous lesions. Moderate multilevel cervical spondylosis at C4-5 through C6-7, with ankylosis of the C4 and C5 vertebral bodies. Prominent degenerative changes about the C1-2 articulation. Other neck: No other acute soft tissue abnormality within the neck. No mass lesion or adenopathy. Subcentimeter hypodense nodule within the inferior right thyroid lobe noted, of doubtful significance given size. Upper chest: Moderate to advanced upper lobe predominant emphysema. Visualized upper chest demonstrates no acute finding. Review of the MIP images confirms the above findings CTA HEAD FINDINGS Anterior circulation: Petrous segments patent bilaterally. Scattered atheromatous plaque within the cavernous/supraclinoid ICAs with secondary mild to moderate multifocal narrowing. No hemodynamically significant stenosis. ICA termini well perfused. A1 segments patent bilaterally. Right A1 hypoplastic, accounting for the slightly diminutive right ICA is compared to the left. Normal anterior communicating artery complex. Anterior cerebral arteries patent to their distal aspects without stenosis. Right M1 widely patent. Normal right MCA bifurcation. On coronal sequence, there appears to be focal occlusion of a proximal right M2 or possibly M3 branch at the base of the right sylvian fissure (series 12, image 88). a this is difficult to see on corresponding axial and sagittal sequence. Distal right MCA branches otherwise well perfused. Left M1 segment widely patent. Normal left MCA bifurcation. Distal left MCA branches well perfused and fairly symmetric with the right. Posterior circulation: Vertebral arteries widely patent to the vertebrobasilar junction without stenosis. Both PICA is patent bilaterally. 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 Basilar widely patent to its distal aspect. Superior cerebral arteries patent bilaterally. Both PCAs primarily supplied via the basilar and are well  perfused to their distal aspects without stenosis. Venous sinuses: Grossly patent allowing for timing the contrast bolus. Poor filling of the left sigmoid sinus and proximal internal jugular vein felt to be related to timing of the contrast bolus. No hyperdensity seen within this region on corresponding noncontrast head CT. Anatomic variants: Hypoplastic right A1 segment. No intracranial aneurysm. Review of the MIP images confirms the above findings IMPRESSION: 1. Focal occlusion of a proximal right M2/M3 branch at the base of the right sylvian fissure as detailed above, concerning for LVO. 2. 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 No significant intraluminal thrombus or intimal irregularity identified. 3. Mild to moderate atherosclerotic change about the aortic arch and carotid siphons without hemodynamically significant stenosis. 4.  Emphysema (ICD10-J43.9). Critical Value/emergent results were called by telephone at the time of interpretation on 12/09/2018 at 10:20 pm to providerDr. Rory Percy, Who verbally acknowledged these results. Electronically Signed   By: Jeannine Boga M.D.   On: 12/09/2018 22:38   Mr Brain Wo Contrast  Result Date: 12/10/2018 CLINICAL DATA:  Speech difficulty. EXAM: MRI HEAD WITHOUT CONTRAST TECHNIQUE: Multiplanar, multiecho pulse sequences of  the brain and surrounding structures were obtained without intravenous contrast. COMPARISON:  CT and CTA from yesterday FINDINGS: Brain: No acute infarction, hemorrhage, hydrocephalus, extra-axial collection or mass lesion. 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 Chronic small vessel ischemic gliosis has become more confluent in the deep cerebral white matter. Stable age normal brain volume. Vascular: Preserved flow voids.  There was CTA yesterday Skull and upper  cervical spine: Normal marrow signal. Cervical facet spurring with C3-4 anterolisthesis. Sinuses/Orbits: Bilateral cataract resection Other: Overall mild intermittent motion degradation IMPRESSION: 1. No acute finding. 2. Moderate chronic small vessel ischemia, including remote small vessel infarcts, that has progressed from 2013. Electronically Signed   By: Monte Fantasia M.D.   On: 12/10/2018 04:18   Ct Cerebral Perfusion W Contrast  Result Date: 12/09/2018 CLINICAL DATA:  Initial evaluation for acute aphasia, left-sided weakness. EXAM: CT PERFUSION BRAIN TECHNIQUE: Multiphase CT imaging of the brain was performed following IV bolus contrast injection. Subsequent parametric perfusion maps were calculated using RAPID software. CONTRAST:  15mL OMNIPAQUE IOHEXOL 350 MG/ML SOLN COMPARISON:  Prior CT and CTA from earlier same day. FINDINGS: CT Brain Perfusion Findings: CBF (<30%) Volume: 35mL Perfusion (Tmax>6.0s) volume: 17mL Mismatch Volume: 44mL ASPECTS on noncontrast CT Head: No aspects score provided. Infarct Core: 0 mL Infarction Location:Negative CT perfusion for acute core infarct or perfusion deficit. Upon further review of prior CTA, previously noted possible right M2/M3 occlusion felt to most likely reflect focal vascular tortuosity with associated stenosis. Attenuated but patent flow is seen distally. IMPRESSION: 1. Negative CT perfusion for acute core infarct or perfusion abnormality. 2. Upon further consideration and in conjunction with these perfusion findings, previously questioned right M2/M3 occlusion felt to be most consistent with focal vascular tortuosity rather than occlusion. These results were communicated to Dr. Rory Percy at 10:52 pmon 10/18/2020by text page via the [facility] messaging system. Electronically Signed   By: Jeannine Boga M.D.   On: 12/09/2018 22:54     Assessment/Plan: 73 year old white female with multiple psych issues and chronic pain on multiple meds presenting with  hyponatremia 1)   Hyponatremia - so given the history of polydipsia, the low urine osmolality and a relatively rapid correction once brought to medical attention makes me think that polydipsia has a lot to do with this presentation.  There also could be a little bit of a tea and toast phenomenon.  In addition, she is on a medication which can cause SIADH although that was not immediately felt to be the issue with a low urine osmolality.  The initial correction of 7 millimoles per liter and 7 hours was concerning but then it rebounded once I think she was more alert and started drinking more water.  Going to put her on a little bit of a fluid restriction, 2 L.  Can continue the normal saline at 75 which is more hypertonic than what she normally takes in.  I am going to stop the Effexor as I can cause SIADH.  Continue to trend sodium over the next 24 hours.  No need for 3 % saline   Mindy Young A Shaila Gilchrest 12/10/2018, 1:55 PM

## 2018-12-10 NOTE — Progress Notes (Signed)
OT Cancellation Note  Patient Details Name: Mindy Young MRN: [id number] DOB: 1945/10/09   Cancelled Treatment:    Reason Eval/Treat Not Completed: Patient at procedure or test/ unavailable(EEG being set up)  [place], OT/L   Acute OT Clinical Specialist [place] Pager [phone] Office [phone]  12/10/2018, 4:34 PM

## 2018-12-10 NOTE — Progress Notes (Signed)
Initial Nutrition Assessment  DOCUMENTATION CODES:   Not applicable  INTERVENTION:  Ensure Enlive po BID, each supplement provides 350 kcal and 20 grams of protein  NUTRITION DIAGNOSIS:   Increased nutrient needs related to acute illness as evidenced by estimated needs.  GOAL:   Patient will meet greater than or equal to 90% of their needs  MONITOR:   PO intake, Supplement acceptance, Labs, Weight trends, I & O's  REASON FOR ASSESSMENT:   Malnutrition Screening Tool    ASSESSMENT:  73 year old female with medical history significant of HTN, GERD, depression, anxiety, tardive dyskinesia, C1 cervical spine fx, breast cancer s/p rt lumpectomy, recent pubic bone fx who presented with difficulty speaking and seizure-like activity. Patient transferred from [facility] to [facility] for further evaluation and treatment.  Patient admitted with seizure-like activity; unclear etiology; LVO verses hyponatremia  10/19 EEG complete - results pending  Unable to obtain nutrition history at this time, provider in with patient at time of visit. Per chart review, patient had seizure-like activity in route to [facility]; Na noted 121 upon arrival to ED. Yesterday RN noted that patient had tonic seizure, biting the left side of her tongue that occurred while RN was changing patient.  Patient with recent diet advancement to [facility] with thin liquids, no recorded meals at this time. Will continue to monitor and provide Ensure to aid with calorie/protein needs.  Medications reviewed and include: folic acid, ritalin, MVI, protonix, senokot  Labs: Na 126 - addressing; trending up  Weight history reviewed; noted 5.9 lb wt (3.8%) loss over the past 3 weeks which is significant for time frame UBW: 68.1 kg - 73.4 kg over the past year  12/08/18 68 kg  11/24/18 70.7 kg  04/17/18 71.2 kg  03/29/18 70.3 kg  03/14/18 73.4 kg  12/05/17 68.1 kg    NUTRITION - FOCUSED PHYSICAL EXAM: Unable to complete at this time   Diet  Order:   Diet Order            Diet Heart Room service appropriate? Yes; Fluid consistency: Thin  Diet effective now              EDUCATION NEEDS:   No education needs have been identified at this time  Skin:     Last BM:  10/19  Height:   Ht Readings from Last 1 Encounters:  12/09/18 5\' 3"  (1.6 m)    Weight: 149.6 lb  Wt Readings from Last 1 Encounters:  12/09/18 68 kg    Ideal Body Weight:  52.3 kg  BMI:  Body mass index is 26.57 kg/m.  Estimated Nutritional Needs:   Kcal:  1600-1800  Protein:  80-90  Fluid:  >/= 1.6 L/day   Lajuan Lines, RD, LDN Clinical Nutrition  [place] Telephone [phone] After Hours/Weekend Pager: [phone]

## 2018-12-10 NOTE — Progress Notes (Signed)
EEG complete - results pending 

## 2018-12-10 NOTE — ED Notes (Addendum)
Pt had soiled her linen. While cleaning and changing pt and linen, pt had a tonic seizure.  HR was 150 and pt cyanotic. Bit left side tongue. Niu MD called and made aware. New orders received.

## 2018-12-11 ENCOUNTER — Ambulatory Visit: Payer: Medicare Other | Admitting: Orthopaedic Surgery

## 2018-12-11 DIAGNOSIS — R479 Unspecified speech disturbances: Secondary | ICD-10-CM

## 2018-12-11 LAB — BASIC METABOLIC PANEL
Anion gap: 10 (ref 5–15)
Anion gap: 12 (ref 5–15)
Anion gap: 12 (ref 5–15)
Anion gap: 12 (ref 5–15)
Anion gap: 13 (ref 5–15)
BUN: 7 mg/dL — ABNORMAL LOW (ref 8–23)
BUN: 8 mg/dL (ref 8–23)
BUN: 8 mg/dL (ref 8–23)
BUN: 9 mg/dL (ref 8–23)
BUN: <5 mg/dL — ABNORMAL LOW (ref 8–23)
CO2: 22 mmol/L (ref 22–32)
CO2: 22 mmol/L (ref 22–32)
CO2: 23 mmol/L (ref 22–32)
CO2: 23 mmol/L (ref 22–32)
CO2: 25 mmol/L (ref 22–32)
Calcium: 8.3 mg/dL — ABNORMAL LOW (ref 8.9–10.3)
Calcium: 8.9 mg/dL (ref 8.9–10.3)
Calcium: 8.9 mg/dL (ref 8.9–10.3)
Calcium: 8.9 mg/dL (ref 8.9–10.3)
Calcium: 8.9 mg/dL (ref 8.9–10.3)
Chloride: 87 mmol/L — ABNORMAL LOW (ref 98–111)
Chloride: 90 mmol/L — ABNORMAL LOW (ref 98–111)
Chloride: 90 mmol/L — ABNORMAL LOW (ref 98–111)
Chloride: 91 mmol/L — ABNORMAL LOW (ref 98–111)
Chloride: 92 mmol/L — ABNORMAL LOW (ref 98–111)
Creatinine, Ser: 0.48 mg/dL (ref 0.44–1.00)
Creatinine, Ser: 0.51 mg/dL (ref 0.44–1.00)
Creatinine, Ser: 0.53 mg/dL (ref 0.44–1.00)
Creatinine, Ser: 0.57 mg/dL (ref 0.44–1.00)
Creatinine, Ser: 0.58 mg/dL (ref 0.44–1.00)
GFR calc Af Amer: >60 mL/min (ref >60)
GFR calc Af Amer: >60 mL/min (ref >60)
GFR calc Af Amer: >60 mL/min (ref >60)
GFR calc Af Amer: >60 mL/min (ref >60)
GFR calc Af Amer: >60 mL/min (ref >60)
GFR calc non Af Amer: >60 mL/min (ref >60)
GFR calc non Af Amer: >60 mL/min (ref >60)
GFR calc non Af Amer: >60 mL/min (ref >60)
GFR calc non Af Amer: >60 mL/min (ref >60)
GFR calc non Af Amer: >60 mL/min (ref >60)
Glucose, Bld: 107 mg/dL — ABNORMAL HIGH (ref 70–99)
Glucose, Bld: 142 mg/dL — ABNORMAL HIGH (ref 70–99)
Glucose, Bld: 95 mg/dL (ref 70–99)
Glucose, Bld: 99 mg/dL (ref 70–99)
Glucose, Bld: 99 mg/dL (ref 70–99)
Potassium: 3.3 mmol/L — ABNORMAL LOW (ref 3.5–5.1)
Potassium: 3.4 mmol/L — ABNORMAL LOW (ref 3.5–5.1)
Potassium: 3.4 mmol/L — ABNORMAL LOW (ref 3.5–5.1)
Potassium: 3.5 mmol/L (ref 3.5–5.1)
Potassium: 4.3 mmol/L (ref 3.5–5.1)
Sodium: 121 mmol/L — ABNORMAL LOW (ref 135–145)
Sodium: 125 mmol/L — ABNORMAL LOW (ref 135–145)
Sodium: 125 mmol/L — ABNORMAL LOW (ref 135–145)
Sodium: 126 mmol/L — ABNORMAL LOW (ref 135–145)
Sodium: 127 mmol/L — ABNORMAL LOW (ref 135–145)

## 2018-12-11 LAB — CBC
HCT: 32.5 % — ABNORMAL LOW (ref 36.0–46.0)
Hemoglobin: 12 g/dL (ref 12.0–15.0)
MCH: 34.6 pg — ABNORMAL HIGH (ref 26.0–34.0)
MCHC: 36.9 g/dL — ABNORMAL HIGH (ref 30.0–36.0)
MCV: 93.7 fL (ref 80.0–100.0)
Platelets: 484 10*3/uL — ABNORMAL HIGH (ref 150–400)
RBC: 3.47 MIL/uL — ABNORMAL LOW (ref 3.87–5.11)
RDW: 11.8 % (ref 11.5–15.5)
WBC: 9.2 10*3/uL (ref 4.0–10.5)
nRBC: 0 % (ref 0.0–0.2)

## 2018-12-11 LAB — HEMOGLOBIN A1C
Hgb A1c MFr Bld: 6 % — ABNORMAL HIGH (ref 4.8–5.6)
Mean Plasma Glucose: 126 mg/dL

## 2018-12-11 LAB — OSMOLALITY: Osmolality: 260 mOsm/kg — ABNORMAL LOW (ref 275–295)

## 2018-12-11 MED ORDER — LORAZEPAM 2 MG/ML IJ SOLN: 2.0000 mg | INTRAMUSCULAR | Status: DC | PRN

## 2018-12-11 MED ORDER — LEVETIRACETAM IN NACL 1500 MG/100ML IV SOLN
1500.0000 mg | Freq: Once | INTRAVENOUS | Status: AC
  Administered 2018-12-11: 1500 mg via INTRAVENOUS
  Filled 2018-12-11: qty 100

## 2018-12-11 MED ORDER — PYRIDOXINE HCL 100 MG/ML IJ SOLN
100.0000 mg | Freq: Every day | INTRAMUSCULAR | Status: DC
  Administered 2018-12-11 – 2018-12-12 (×2): 100 mg via INTRAVENOUS
  Filled 2018-12-11 (×2): qty 1

## 2018-12-11 MED ORDER — OXYBUTYNIN CHLORIDE ER 10 MG PO TB24
10.0000 mg | ORAL_TABLET | Freq: Every day | ORAL | Status: DC
  Administered 2018-12-12 – 2018-12-13 (×2): 10 mg via ORAL
  Filled 2018-12-11 (×2): qty 1

## 2018-12-11 MED ORDER — LEVETIRACETAM IN NACL 1000 MG/100ML IV SOLN
1000.0000 mg | Freq: Two times a day (BID) | INTRAVENOUS | Status: DC
  Administered 2018-12-11: 1000 mg via INTRAVENOUS
  Filled 2018-12-11: qty 100

## 2018-12-11 MED ORDER — LEVETIRACETAM IN NACL 500 MG/100ML IV SOLN: 500.0000 mg | Freq: Two times a day (BID) | INTRAVENOUS | Status: DC

## 2018-12-11 NOTE — Progress Notes (Signed)
PROGRESS NOTE    Mindy Young   [id number] DOB: 1945/03/05 DOA: 12/09/2018  Admitted from: Home PCP: Lennie Odor, [facility] Summary  This is a 73 year old female with past medical history of hypertension, anxiety/depression, breast cancer, alcohol abuse, tobacco use, C1 cervical spine fracture, chronic hyponatremia, recent pubic bone fracture, chronic pain on narcotics and muscle relaxers as well as Xanax who was brought to the ED on 10/18 who presented with difficulty speaking and seizure-like activity.  She was seen having difficulty speaking and stuttering about 11:30 AM prior to admission and has had 3 additional similar prior episodes.  Patient had a unilateral left-sided gaze not responding while her eyes were open but denied unilateral weakness or numbness in the extremities.  In the ED was found to have WBC 12.7, negative UA, negative EtOH, sodium 121, CT a head and neck with questionable right M2 stenosis versus occlusion versus tortuous vessel artifact, CT brain perfusion negative.  Neurology was consulted.  Patient had EEG which was negative.  MRI brain without acute findings.  Nephrology was consulted for acute on chronic hyponatremia with seizure-like activity thought to be symptomatic from hyponatremia.  Patient underwent continuous EEG and had confirmed seizures and received IV Keppra 1500 mg loading dose followed by Keppra 500 mg twice daily and started on pyridoxine 100 mg daily while on Keppra per neurology.  A & P   Principal Problem:   Seizure-like activity (HCC) Active Problems:   Hyponatremia   Alcohol abuse   Cigarette smoker   Hypertension   Malignant neoplasm of lower-inner quadrant of right breast of female, estrogen receptor positive ([place])   GERD (gastroesophageal reflux disease)   Anxiety   Difficulty speaking   Leukocytosis   New onset confirmed seizures confirmed on long-term EEG monitoring. Marland Kitchen Keppra 1500 mg IV loading dose followed by Keppra  500 mg twice daily, pyridoxine 100 mg daily while on Keppra and continuing long-term EEG monitoring to monitor for subclinical seizures per neurology. . As needed Ativan for generalized tonic-clonic seizure greater than 2 minutes . Holding oxybutynin as this has seizure risk as well  Hyponatremia, multifactorial edema is been variable and still low over the past 24 hours.  Thought to be polydipsia and possible tea and toast phenomenon and questionable SIADH per nephrology, initially had a correction and then rebounded once patient was alert and drinking more water. . Per nephrology: Hold Effexor, 2 L fluid restriction, hold normal saline continue to trend sodium  Alcohol use in setting of depression has had an increase in alcohol intake over the past year with several stressors including passing of her father from [place], daughter moving away who she is very close to. . Counseled on alcohol cessation . Case management . CIWA protocol  Tobacco use . Counseled on quitting . Nicotine patch  Depression . Holding Effexor as above . Follow-up with outpatient psych  Hypertension . Continue home amlodipine, clonidine, metoprolol  Right breast cancer, ER positive . Continue letrozole  GERD . Protonix  Anxiety . On alprazolam 0.5 mg twice daily as needed, monitor for overdose if she receives Ativan per CIWA protocol  Leukocytosis resolved, likely reactive.  Afebrile without signs of infection . Hold off on antibiotics for now  DVT prophylaxis: Lovenox   Code Status: Partial Code  Diet: Heart healthy Family Communication: Daughter updated at bedside Disposition Plan: Pending medical stability  Consultants  . Neurology . Nephrology  Procedures  . EEG . Long-term EEG  Antibiotics  None  Subjective   Patient denied any complaints at this time and stated she was initially feeling better.  Currently making jokes about the EEG monitoring.  She states that overnight she was  doing well but then became concerned about having the monitor in her room and started thinking a lot.  At that time she states she felt she had another seizure.  She believes that she has the seizures when she starts to think too much.  During our conversation she had another episode lasting roughly 3 minutes nearly resolved similar to priors with staring spell and inability to speak.  Otherwise she denies any chest pain, shortness of breath, nausea or vomiting or any other neurologic complaints.  Objective   Vitals:   12/11/18 0534 12/11/18 0600 12/11/18 0602 12/11/18 0755  BP:    (!) 159/83  Pulse: 81 77 77 81  Resp:      Temp:    98.4 F (36.9 C)  TempSrc:    Oral  SpO2:      Weight:      Height:        Intake/Output Summary (Last 24 hours) at 12/11/2018 1258 Last data filed at 12/11/2018 0758 Gross per 24 hour  Intake 760 ml  Output 2650 ml  Net -1890 ml   Filed Weights   12/09/18 1814  Weight: 68 kg    Examination:  Physical Exam Vitals signs and nursing note reviewed.  Constitutional:      Appearance: Normal appearance.     Comments: EEG monitoring in place  HENT:     Head: Normocephalic and atraumatic.     Nose: Nose normal.     Mouth/Throat:     Mouth: Mucous membranes are moist.  Eyes:     Extraocular Movements: Extraocular movements intact.  Neck:     Musculoskeletal: Normal range of motion. No neck rigidity.  Cardiovascular:     Rate and Rhythm: Normal rate and regular rhythm.  Pulmonary:     Effort: Pulmonary effort is normal.     Breath sounds: Normal breath sounds.  Abdominal:     General: Abdomen is flat.     Palpations: Abdomen is soft.  Musculoskeletal: Normal range of motion.        General: No swelling.  Neurological:     Mental Status: She is alert.     Comments: Had an episode of staring spell with aphasia during my exam is lasted 2 to 3 minutes    Psychiatric:        Mood and Affect: Mood normal.        Behavior: Behavior normal.      Data Reviewed: I have personally reviewed following labs and imaging studies  CBC: Recent Labs  Lab 12/09/18 1910 12/11/18 0239  WBC 12.7* 9.2  NEUTROABS 9.0*  --   HGB 13.1 12.0  HCT 37.4 32.5*  MCV 94.7 93.7  PLT 553* 123456*   Basic Metabolic Panel: Recent Labs  Lab 12/10/18 0812 12/10/18 1536 12/10/18 2047 12/10/18 2344 12/11/18 0239 12/11/18 0939  NA 126* 122* 121* 121* 127* 125*  K 3.6 3.5 3.3* 3.4* 4.3 3.4*  CL 95* 89* 88* 87* 92* 90*  CO2 22 21* 21* 22 25 23   GLUCOSE 108* 98 116* 99 95 107*  BUN 6* 9 9 8 8  <5*  CREATININE 0.54 0.66 0.65 0.51 0.58 0.57  CALCIUM 8.5* 8.6* 8.3* 8.3* 8.9 8.9  MG 1.7  --   --   --   --   --  PHOS 3.8  --   --   --   --   --    GFR: Estimated Creatinine Clearance: 57.9 mL/min (by C-G formula based on SCr of 0.57 mg/dL). Liver Function Tests: Recent Labs  Lab 12/09/18 1910  AST 24  ALT 19  ALKPHOS 146*  BILITOT 0.7  PROT 7.4  ALBUMIN 3.8   No results for input(s): LIPASE, AMYLASE in the last 168 hours. No results for input(s): AMMONIA in the last 168 hours. Coagulation Profile: Recent Labs  Lab 12/09/18 2213  INR 1.0   Cardiac Enzymes: No results for input(s): CKTOTAL, CKMB, CKMBINDEX, TROPONINI in the last 168 hours. BNP (last 3 results) No results for input(s): PROBNP in the last 8760 hours. HbA1C: Recent Labs    12/10/18 0211  HGBA1C 6.0*   CBG: No results for input(s): GLUCAP in the last 168 hours. Lipid Profile: Recent Labs    12/10/18 0211  CHOL 157  HDL 59  LDLCALC 88  TRIG 51  CHOLHDL 2.7   Thyroid Function Tests: Recent Labs    12/10/18 0211  TSH 0.974   Anemia Panel: No results for input(s): VITAMINB12, FOLATE, FERRITIN, TIBC, IRON, RETICCTPCT in the last 72 hours. Sepsis Labs: No results for input(s): PROCALCITON, LATICACIDVEN in the last 168 hours.  Recent Results (from the past 240 hour(s))  SARS CORONAVIRUS 2 (TAT 6-24 HRS) Nasopharyngeal Nasopharyngeal Swab     Status: None    Collection Time: 12/09/18 11:29 PM   Specimen: Nasopharyngeal Swab  Result Value Ref Range Status   SARS Coronavirus 2 NEGATIVE NEGATIVE Final    Comment: (NOTE) SARS-CoV-2 target nucleic acids are NOT DETECTED. The SARS-CoV-2 RNA is generally detectable in upper and lower respiratory specimens during the acute phase of infection. Negative results do not preclude SARS-CoV-2 infection, do not rule out co-infections with other pathogens, and should not be used as the sole basis for treatment or other patient management decisions. Negative results must be combined with clinical observations, patient history, and epidemiological information. The expected result is Negative. Fact Sheet for Patients: SugarRoll.be Fact Sheet for Healthcare Providers: https://www.woods-mathews.com/ This test is not yet approved or cleared by the [place] FDA and  has been authorized for detection and/or diagnosis of SARS-CoV-2 by FDA under an Emergency Use Authorization (EUA). This EUA will remain  in effect (meaning this test can be used) for the duration of the COVID-19 declaration under Section 56 4(b)(1) of the Act, 21 U.S.C. section 360bbb-3(b)(1), unless the authorization is terminated or revoked sooner. Performed at [facility], [place] [address], [place], [place] [address]          Radiology Studies: Ct Angio Head W Or Wo Contrast  Result Date: 12/09/2018 CLINICAL DATA:  Initial evaluation for acute speech difficulty. Left-sided weakness. EXAM: CT ANGIOGRAPHY HEAD AND NECK TECHNIQUE: Multidetector CT imaging of the head and neck was performed using the standard protocol during bolus administration of intravenous contrast. Multiplanar CT image reconstructions and MIPs were obtained to evaluate the vascular anatomy. Carotid stenosis measurements (when applicable) are obtained utilizing NASCET criteria, using the distal internal carotid diameter  as the denominator. CONTRAST:  9mL OMNIPAQUE IOHEXOL 350 MG/ML SOLN COMPARISON:  Prior head CT from earlier the same day. FINDINGS: CT HEAD FINDINGS Brain: Examination somewhat degraded by motion artifact. Age-related cerebral atrophy with moderate chronic microvascular ischemic disease. No acute intracranial hemorrhage. No acute large vessel territory infarct. No mass lesion, midline shift or mass effect. No hydrocephalus. No extra-axial fluid  collection. Vascular: No hyperdense vessel. Scattered vascular calcifications noted within the carotid siphons. Skull: Scalp soft tissues and calvarium within normal limits. Sinuses: Chronic left sphenoid sinusitis noted. Visualized paranasal sinuses are otherwise clear. No mastoid effusion. Orbits: Globes and orbital soft tissues demonstrate no acute finding. Review of the MIP images confirms the above findings CTA NECK FINDINGS Aortic arch: Visualized aortic arch normal in caliber with normal branch pattern. Moderate atherosclerotic change about the aortic arch and origin of the great vessels without hemodynamically significant stenosis. Visualized subclavian arteries widely patent. Right carotid system: Right CCA tortuous proximally but widely patent to the bifurcation without stenosis. No significant atheromatous narrowing about the right bifurcation. Focal pseudoaneurysm extending from the medial aspect of the mid-distal right ICA measures 7 x 4 x 6 mm (series 11, image 200). No significant intraluminal thrombus or intimal irregularity. Right ICA otherwise widely patent without stenosis or other acute abnormality. Left carotid system: Left CCA tortuous proximally but widely patent to the bifurcation without stenosis. No significant atheromatous narrowing about the left bifurcation. Left ICA widely patent distally to the skull base without stenosis, dissection, or occlusion. Vertebral arteries: Both vertebral arteries arise from the subclavian arteries. Mixed plaque at  the origin of the left vertebral artery with associated stenosis of up to approximately 50%. Additional moderate multifocal areas of narrowing within the right V2 segment at the levels of C5 and C3, and on the left at C1 related to intrinsic compression from uncovertebral disease. Vertebral arteries otherwise patent without stenosis or vascular occlusion. Skeleton: No acute osseous abnormality. No discrete lytic or blastic osseous lesions. Moderate multilevel cervical spondylosis at C4-5 through C6-7, with ankylosis of the C4 and C5 vertebral bodies. Prominent degenerative changes about the C1-2 articulation. Other neck: No other acute soft tissue abnormality within the neck. No mass lesion or adenopathy. Subcentimeter hypodense nodule within the inferior right thyroid lobe noted, of doubtful significance given size. Upper chest: Moderate to advanced upper lobe predominant emphysema. Visualized upper chest demonstrates no acute finding. Review of the MIP images confirms the above findings CTA HEAD FINDINGS Anterior circulation: Petrous segments patent bilaterally. Scattered atheromatous plaque within the cavernous/supraclinoid ICAs with secondary mild to moderate multifocal narrowing. No hemodynamically significant stenosis. ICA termini well perfused. A1 segments patent bilaterally. Right A1 hypoplastic, accounting for the slightly diminutive right ICA is compared to the left. Normal anterior communicating artery complex. Anterior cerebral arteries patent to their distal aspects without stenosis. Right M1 widely patent. Normal right MCA bifurcation. On coronal sequence, there appears to be focal occlusion of a proximal right M2 or possibly M3 branch at the base of the right sylvian fissure (series 12, image 88). a this is difficult to see on corresponding axial and sagittal sequence. Distal right MCA branches otherwise well perfused. Left M1 segment widely patent. Normal left MCA bifurcation. Distal left MCA  branches well perfused and fairly symmetric with the right. Posterior circulation: Vertebral arteries widely patent to the vertebrobasilar junction without stenosis. Both PICA is patent bilaterally. 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 Basilar widely patent to its distal aspect. Superior cerebral arteries patent bilaterally. Both PCAs primarily supplied via the basilar and are well perfused to their distal aspects without stenosis. Venous sinuses: Grossly patent allowing for timing the contrast bolus. Poor filling of the left sigmoid sinus and proximal internal jugular vein felt to be related to timing of the contrast bolus. No hyperdensity seen within this region on corresponding noncontrast head CT. Anatomic variants: Hypoplastic right  A1 segment. No intracranial aneurysm. Review of the MIP images confirms the above findings IMPRESSION: 1. Focal occlusion of a proximal right M2/M3 branch at the base of the right sylvian fissure as detailed above, concerning for LVO. 2. 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 No significant intraluminal thrombus or intimal irregularity identified. 3. Mild to moderate atherosclerotic change about the aortic arch and carotid siphons without hemodynamically significant stenosis. 4.  Emphysema (ICD10-J43.9). Critical Value/emergent results were called by telephone at the time of interpretation on 12/09/2018 at 10:20 pm to providerDr. Rory Percy, Who verbally acknowledged these results. Electronically Signed   By: Jeannine Boga M.D.   On: 12/09/2018 22:38   Ct Head Wo Contrast  Result Date: 12/09/2018 CLINICAL DATA:  73 year old female with history of speech difficulty for the past day. EXAM: CT HEAD WITHOUT CONTRAST TECHNIQUE: Contiguous axial images were obtained from the base of the skull through the vertex without intravenous contrast. COMPARISON:  Head CT 11/12/2017.  FINDINGS: Brain: In the inferior aspects of the frontal lobes of the brain bilaterally (right greater than left) there are poorly find areas of low attenuation and disruption of normal gray-white differentiation, which appear slightly more conspicuous than prior study 11/11/2017. Patchy and confluent areas of decreased attenuation are noted throughout the deep and periventricular white matter of the cerebral hemispheres bilaterally, compatible with chronic microvascular ischemic disease. No evidence of acute infarction, hemorrhage, hydrocephalus, extra-axial collection or mass lesion/mass effect. Vascular: No hyperdense vessel or unexpected calcification. Skull: Normal. Negative for fracture or focal lesion. Sinuses/Orbits: No acute finding. Other: None. IMPRESSION: 1. Ill-defined areas of low attenuation in the frontal lobes bilaterally (right greater than left) with loss of gray-white differentiation, increased compared to prior study 11/11/2017. Areas of age-indeterminate ischemia in these regions are not excluded. If there is clinical concern for acute ischemia, further evaluation with brain MRI could be considered. 2. Extensive chronic microvascular ischemic changes throughout the cerebral white matter redemonstrated. Critical Value/emergent results were called by telephone at the time of interpretation on 12/09/2018 at 8:06 pm to [place] , who verbally acknowledged these results. Electronically Signed   By: Vinnie Langton M.D.   On: 12/09/2018 20:06   Ct Angio Neck W And/or Wo Contrast  Result Date: 12/09/2018 CLINICAL DATA:  Initial evaluation for acute speech difficulty. Left-sided weakness. EXAM: CT ANGIOGRAPHY HEAD AND NECK TECHNIQUE: Multidetector CT imaging of the head and neck was performed using the standard protocol during bolus administration of intravenous contrast. Multiplanar CT image reconstructions and MIPs were obtained to evaluate the vascular anatomy. Carotid stenosis  measurements (when applicable) are obtained utilizing NASCET criteria, using the distal internal carotid diameter as the denominator. CONTRAST:  87mL OMNIPAQUE IOHEXOL 350 MG/ML SOLN COMPARISON:  Prior head CT from earlier the same day. FINDINGS: CT HEAD FINDINGS Brain: Examination somewhat degraded by motion artifact. Age-related cerebral atrophy with moderate chronic microvascular ischemic disease. No acute intracranial hemorrhage. No acute large vessel territory infarct. No mass lesion, midline shift or mass effect. No hydrocephalus. No extra-axial fluid collection. Vascular: No hyperdense vessel. Scattered vascular calcifications noted within the carotid siphons. Skull: Scalp soft tissues and calvarium within normal limits. Sinuses: Chronic left sphenoid sinusitis noted. Visualized paranasal sinuses are otherwise clear. No mastoid effusion. Orbits: Globes and orbital soft tissues demonstrate no acute finding. Review of the MIP images confirms the above findings CTA NECK FINDINGS Aortic arch: Visualized aortic arch normal in caliber  with normal branch pattern. Moderate atherosclerotic change about the aortic arch and origin of the great vessels without hemodynamically significant stenosis. Visualized subclavian arteries widely patent. Right carotid system: Right CCA tortuous proximally but widely patent to the bifurcation without stenosis. No significant atheromatous narrowing about the right bifurcation. Focal pseudoaneurysm extending from the medial aspect of the mid-distal right ICA measures 7 x 4 x 6 mm (series 11, image 200). No significant intraluminal thrombus or intimal irregularity. Right ICA otherwise widely patent without stenosis or other acute abnormality. Left carotid system: Left CCA tortuous proximally but widely patent to the bifurcation without stenosis. No significant atheromatous narrowing about the left bifurcation. Left ICA widely patent distally to the skull base without stenosis,  dissection, or occlusion. Vertebral arteries: Both vertebral arteries arise from the subclavian arteries. Mixed plaque at the origin of the left vertebral artery with associated stenosis of up to approximately 50%. Additional moderate multifocal areas of narrowing within the right V2 segment at the levels of C5 and C3, and on the left at C1 related to intrinsic compression from uncovertebral disease. Vertebral arteries otherwise patent without stenosis or vascular occlusion. Skeleton: No acute osseous abnormality. No discrete lytic or blastic osseous lesions. Moderate multilevel cervical spondylosis at C4-5 through C6-7, with ankylosis of the C4 and C5 vertebral bodies. Prominent degenerative changes about the C1-2 articulation. Other neck: No other acute soft tissue abnormality within the neck. No mass lesion or adenopathy. Subcentimeter hypodense nodule within the inferior right thyroid lobe noted, of doubtful significance given size. Upper chest: Moderate to advanced upper lobe predominant emphysema. Visualized upper chest demonstrates no acute finding. Review of the MIP images confirms the above findings CTA HEAD FINDINGS Anterior circulation: Petrous segments patent bilaterally. Scattered atheromatous plaque within the cavernous/supraclinoid ICAs with secondary mild to moderate multifocal narrowing. No hemodynamically significant stenosis. ICA termini well perfused. A1 segments patent bilaterally. Right A1 hypoplastic, accounting for the slightly diminutive right ICA is compared to the left. Normal anterior communicating artery complex. Anterior cerebral arteries patent to their distal aspects without stenosis. Right M1 widely patent. Normal right MCA bifurcation. On coronal sequence, there appears to be focal occlusion of a proximal right M2 or possibly M3 branch at the base of the right sylvian fissure (series 12, image 88). a this is difficult to see on corresponding axial and sagittal sequence. Distal right  MCA branches otherwise well perfused. Left M1 segment widely patent. Normal left MCA bifurcation. Distal left MCA branches well perfused and fairly symmetric with the right. Posterior circulation: Vertebral arteries widely patent to the vertebrobasilar junction without stenosis. Both PICA is patent bilaterally. 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 Basilar widely patent to its distal aspect. Superior cerebral arteries patent bilaterally. Both PCAs primarily supplied via the basilar and are well perfused to their distal aspects without stenosis. Venous sinuses: Grossly patent allowing for timing the contrast bolus. Poor filling of the left sigmoid sinus and proximal internal jugular vein felt to be related to timing of the contrast bolus. No hyperdensity seen within this region on corresponding noncontrast head CT. Anatomic variants: Hypoplastic right A1 segment. No intracranial aneurysm. Review of the MIP images confirms the above findings IMPRESSION: 1. Focal occlusion of a proximal right M2/M3 branch at the base of the right sylvian fissure as detailed above, concerning for LVO. 2. 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 No significant intraluminal thrombus or intimal irregularity  identified. 3. Mild to moderate atherosclerotic change about the aortic arch and carotid siphons without hemodynamically significant stenosis. 4.  Emphysema (ICD10-J43.9). Critical Value/emergent results were called by telephone at the time of interpretation on 12/09/2018 at 10:20 pm to providerDr. Rory Percy, Who verbally acknowledged these results. Electronically Signed   By: Jeannine Boga M.D.   On: 12/09/2018 22:38   Mr Brain Wo Contrast  Result Date: 12/10/2018 CLINICAL DATA:  Speech difficulty. EXAM: MRI HEAD WITHOUT CONTRAST TECHNIQUE: Multiplanar, multiecho pulse sequences of the brain and surrounding structures were  obtained without intravenous contrast. COMPARISON:  CT and CTA from yesterday FINDINGS: Brain: No acute infarction, hemorrhage, hydrocephalus, extra-axial collection or mass lesion. 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 Chronic small vessel ischemic gliosis has become more confluent in the deep cerebral white matter. Stable age normal brain volume. Vascular: Preserved flow voids.  There was CTA yesterday Skull and upper cervical spine: Normal marrow signal. Cervical facet spurring with C3-4 anterolisthesis. Sinuses/Orbits: Bilateral cataract resection Other: Overall mild intermittent motion degradation IMPRESSION: 1. No acute finding. 2. Moderate chronic small vessel ischemia, including remote small vessel infarcts, that has progressed from 2013. Electronically Signed   By: [place] Fantasia M.D.   On: 12/10/2018 04:18   Ct Cerebral Perfusion W Contrast  Result Date: 12/09/2018 CLINICAL DATA:  Initial evaluation for acute aphasia, left-sided weakness. EXAM: CT PERFUSION BRAIN TECHNIQUE: Multiphase CT imaging of the brain was performed following IV bolus contrast injection. Subsequent parametric perfusion maps were calculated using RAPID software. CONTRAST:  69mL OMNIPAQUE IOHEXOL 350 MG/ML SOLN COMPARISON:  Prior CT and CTA from earlier same day. FINDINGS: CT Brain Perfusion Findings: CBF (<30%) Volume: 9mL Perfusion (Tmax>6.0s) volume: 25mL Mismatch Volume: 55mL ASPECTS on noncontrast CT Head: No aspects score provided. Infarct Core: 0 mL Infarction Location:Negative CT perfusion for acute core infarct or perfusion deficit. Upon further review of prior CTA, previously noted possible right M2/M3 occlusion felt to most likely reflect focal vascular tortuosity with associated stenosis. Attenuated but patent flow is seen distally. IMPRESSION: 1. Negative CT perfusion for acute core infarct or perfusion abnormality. 2. Upon  further consideration and in conjunction with these perfusion findings, previously questioned right M2/M3 occlusion felt to be most consistent with focal vascular tortuosity rather than occlusion. These results were communicated to Dr. Rory Percy at 10:52 pmon 10/18/2020by text page via the [facility] messaging system. Electronically Signed   By: Jeannine Boga M.D.   On: 12/09/2018 22:54        Scheduled Meds: . amLODipine  5 mg Oral Daily  . aspirin EC  81 mg Oral Daily  . cloNIDine  0.1 mg Oral BID  . enoxaparin (LOVENOX) injection  40 mg Subcutaneous Q24H  . feeding supplement (ENSURE ENLIVE)  237 mL Oral BID BM  . folic acid  1 mg Oral Daily  . letrozole  2.5 mg Oral Daily  . loratadine  10 mg Oral Daily  . LORazepam  0-4 mg Intravenous Q6H   Followed by  . [START ON 12/12/2018] LORazepam  0-4 mg Intravenous Q12H  . methylphenidate  20 mg Oral Daily  . metoprolol succinate  100 mg Oral Daily  . multivitamin with minerals  1 tablet Oral Daily  . nicotine  21 mg Transdermal Daily  . [START ON 12/12/2018] oxybutynin  10 mg Oral Daily  . pantoprazole  40 mg Oral Daily  . pyridOXINE  100 mg Intravenous Daily  .  senna-docusate  1 tablet Oral QHS  . thiamine  100 mg Oral Daily   Or  . thiamine  100 mg Intravenous Daily   Continuous Infusions: . levETIRAcetam       LOS: 2 days    Time spent: 30 minutes    Harold Hedge, DO [facility] Pager [phone]  If 7PM-7AM, please contact night-coverage [email or web address] Password [email or web address] 12/11/2018, 12:58 PM

## 2018-12-11 NOTE — Progress Notes (Addendum)
Reason for consult: Seizure  Subjective: Patient had multiple episodes yesterday and overnight during which she had trouble speaking, word finding difficulties.  She was able to understand and follow commands during these events.  Patient denies ever having seizures before.  She states she had trouble thinking and getting her words out intermittently for the last few days.   ROS: negative except above  Examination  Vital signs in last 24 hours: Temp:  [98.2 F (36.8 C)-98.7 F (37.1 C)] 98.4 F (36.9 C) (10/20 0755) Pulse Rate:  [77-106] 81 (10/20 0755) Resp:  [18] 18 (10/20 0354) BP: (139-159)/(80-86) 159/83 (10/20 0755) SpO2:  [94 %-96 %] 94 % (10/20 0354)  General: lying in bed, not in apparent distress  CVS: pulse-normal rate and rhythm RS: breathing comfortably Extremities: normal   Neuro: MS: Alert, oriented, follows commands.  Patient had 2 episodes of word finding difficulty while I was in the room during which she was able to follow commands intermittently but unable to name objects CN: pupils equal and reactive,  EOMI, face symmetric, tongue midline, normal sensation over face, Motor: 4/5 strength in all 4 extremities Reflexes: 2+ bilaterally over patella, biceps, plantars: flexor Coordination: normal Gait: not tested  Basic Metabolic Panel: Recent Labs  Lab 12/10/18 0812 12/10/18 1536 12/10/18 2047 12/10/18 2344 12/11/18 0239  NA 126* 122* 121* 121* 127*  K 3.6 3.5 3.3* 3.4* 4.3  CL 95* 89* 88* 87* 92*  CO2 22 21* 21* 22 25  GLUCOSE 108* 98 116* 99 95  BUN 6* 9 9 8 8   CREATININE 0.54 0.66 0.65 0.51 0.58  CALCIUM 8.5* 8.6* 8.3* 8.3* 8.9  MG 1.7  --   --   --   --   PHOS 3.8  --   --   --   --     CBC: Recent Labs  Lab 12/09/18 1910 12/11/18 0239  WBC 12.7* 9.2  NEUTROABS 9.0*  --   HGB 13.1 12.0  HCT 37.4 32.5*  MCV 94.7 93.7  PLT 553* 484*     Coagulation Studies: Recent Labs    12/09/18 25-May-2211  LABPROT 13.4  INR 1.0    Imaging MRI  brain without contrast 10/19/ongoing: No acute abnormality.   ASSESSMENT AND PLAN: 73 year old female with hyponatremia who presented with new onset speech disturbance and word finding difficulty.  Concomitant EEG showed rhythmic left posterior temporal slowing consistent with seizures.    Speech disturbance New onset seizures Hyponatremia -As patient has not had seizures before and her MRI brain did not show any acute abnormality, it is possible that these are provoked seizures in the setting of hyponatremia -However patient is having very frequent seizures and therefore we will start treating them with antiepileptics at this point.  Recommendations -IV Keppra 1500 mg loading dose followed by Keppra 500 mg twice daily for maintenance -Patient has history of depression and anxiety.  We discussed about side effects of Keppra including irritability.  We also discussed other medications including Depakote which has mood stabilizing properties.  However patient did not like the other side effects including weight gain, thinning of hair, tremors, etc. Therefore, patient would like to go ahead with Keppra at this point. -We will also start Pyridoxine 100 mg daily while patient is on Keppra -We will continue LTM EEG to monitor for subclinical seizures -Most likely patient will be discharged on at least 1 antiseizure medication.  However once hyponatremia is corrected if patient does not have any further seizures, antiseizure medication  can possibly be weaned off as an outpatient. -Seizure precautions -As needed IV Ativan for generalized tonic-clonic seizure lasting more than 2 minutes, focal seizure lasting more than 5 minutes  Addendum - Patient had more seizures after 1500mg  IV keppra. Therefore will gove another 1500mg  Keppra and increase maintenance to 1000mg  BID - Goal is to minimize seizures while primary team is correcting hyponatremia - Next agent can be vimpat or VPA if seizures persist,  avoid sedation to the point of requiring intubation.    Thank you for allowing [place] to participate in the care of this patient.  Please page neuro hospitalist after 5 PM for any questions.   I have spent a total of  35  minutes with the patient reviewing hospital notes,  test results, labs and examining the patient as well as establishing an assessment and plan that was discussed personally with the patient.  > 50% of time was spent in direct patient care.  I discussed the diagnosis of seizure, EEG findings, different antiseizure medications and their side effects with the patient.

## 2018-12-11 NOTE — Progress Notes (Addendum)
Spot EEG was normal.   LTM EEG shows some left sided rhythmic delta activity suspicious for possible seizures   MRI brain revealed no acute abnormality. There is moderate chronic small vessel ischemia, including remote small vessel infarcts, that has progressed from 2013.  Assessment: 73 year old female with episodes of verbal unresponsiveness without loss of consciousness. 1. Initial EEG showed no epileptiform discharges. However, LTM EEG showed left sided electrographic seizure manifesting as rhythmic delta activity.  2. MRI brain shows no clear seizure focus.  3. Metabolic encephalopathy in the setting of hyponatremia.  4. Possible toxic encephalopathy-concern for polypharmacy with pain medications and muscle relaxants  Recommendations: -- Correction of hyponatremia - slow and gradual. Do not correct more than 10 points in 24 hours. -- Start Keppra with 1500 mg IV load, then continue with 500 mg po BID -- Continue LTM EEG.  Electronically signed: Dr. Kerney Elbe

## 2018-12-11 NOTE — Procedures (Signed)
Patient Name: Mindy Young  MRN: [id number]  Epilepsy Attending: Lora Havens  Referring Physician/Provider: Dr Kerney Elbe Duration: 12/10/2018 1534 to 12/11/2018 1534  Patient history:  73yo F with episode of speech disturbance. EEG to evaluate for seizure.  Level of alertness: awake, asleep  AEDs during EEG study: None  Technical aspects: This EEG study was done with scalp electrodes positioned according to the 10-20 International system of electrode placement. Electrical activity was acquired at a sampling rate of 500Hz  and reviewed with a high frequency filter of 70Hz  and a low frequency filter of 1Hz . EEG data were recorded continuously and digitally stored.   DESCRIPTION: During awake state, the posterior dominant rhythm consists of 9 Hz activity of moderate voltage (25-35 uV) seen predominantly in posterior head regions, symmetric and reactive to eye opening and eye closing. Sleep was characterized by sleep spindles (12 to 14 Hz), vertex waves, maximum frontocentral  Event button was pressed multiple times on 12/10/2018 at 1723, 1749, 1818, 1841, 1906. 1931, 2000, 2019 and on 12/11/2018 at 0915, 1018, 1041, 1251 and 1431.  During all these events, patient was noted to have word finding difficulties.  The episode lasted about 4 minutes on average.  Concomitant EEG showed rhythmic delta activity in left temporal region which gradually evolved into to theta slowing and spread to involve all of left hemisphere.  Multiple such events were also seen during sleep and when patient was not tested at night.  Hyperventilation and photic stimulation were not performed.  Abnormality -Seizure, left temporal  IMPRESSION: This study showed multiple seizures arising from left temporal region during which patient had double finding her words, speaking, lasting about 4 minutes on average.  Concomitant EEG showed rhythmic delta activity in the left temporal region which gradually evolved into to  theta slowing and spread to involve all of left hemisphere.  Mindy Young [name]

## 2018-12-11 NOTE — Progress Notes (Signed)
PT Cancellation Note  Patient Details Name: Mindy Young MRN: [id number] DOB: 08/07/1945   Cancelled Treatment:    Reason Eval/Treat Not Completed: Patient at procedure or test/unavailable. Pt on continuous EEG and having "episodes" of slurred speech. RN asked to hold therapy at this time. PT to return as able.  Kittie Plater, PT, DPT Acute Rehabilitation Services Pager #: [phone] Office #: [phone]    Berline Lopes 12/11/2018, 12:35 PM

## 2018-12-11 NOTE — Progress Notes (Signed)
LTM maint complete - no skin breakdown under:  Fp1, Fp2

## 2018-12-11 NOTE — Progress Notes (Signed)
Subjective:  Sodium variable over last 24 hours.  After the initial improvement in ER- it drifted down, then acutely overnight corrected again.  Urine osm is still low indicating that her kidneys are trying to correct.  Presumably she does not exhibit this polydipsia behavior when she is sleeping.  NS was stopped appropriately.  She looks a little better- speech is fluent but then it stops at times    Objective Vital signs in last 24 hours: Vitals:   12/11/18 0534 12/11/18 0600 12/11/18 0602 12/11/18 0755  BP:    (!) 159/83  Pulse: 81 77 77 81  Resp:      Temp:    98.4 F (36.9 C)  TempSrc:    Oral  SpO2:      Weight:      Height:       Weight change:   Intake/Output Summary (Last 24 hours) at 12/11/2018 0904 Last data filed at 12/11/2018 0758 Gross per 24 hour  Intake 760 ml  Output 2650 ml  Net -1890 ml    Assessment/Plan: 73 year old white female with multiple psych issues and chronic pain on multiple meds presenting with hyponatremia 1)   Hyponatremia - so given the history of polydipsia, the low urine osmolality and a relatively rapid correction once brought to medical attention makes me think that polydipsia has a lot to do with this presentation.  There also could be a little bit of a tea and toast phenomenon.  In addition, she is on a medication which can cause SIADH although that was not immediately felt to be the issue with a low urine osmolality.  The initial correction of 7 millimoles per liter and 7 hours was concerning but then it rebounded once I think she was more alert and started drinking more water.  Going to try to put her on a little bit of a fluid restriction, 2 L. Have stopped the normal saline, was using it to give her something more hypertonic than the water but it may have contributed to the rapid correction overnight.  I  stopped the Effexor as it can cause SIADH.  Continue to trend sodium.  No need for 3 % saline. Difficult for me to say this is all due to  sodium as sodium is better now and still having these speech issues.  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   Liver Function Tests: Recent Labs  Lab 12/09/18 1910  AST 24  ALT 19  ALKPHOS 146*  BILITOT 0.7  PROT 7.4  ALBUMIN 3.8   No results for input(s): LIPASE, AMYLASE in the last 168 hours. No results for input(s): AMMONIA in the last  168 hours. CBC: Recent Labs  Lab 12/09/18 1910 12/11/18 0239  WBC 12.7* 9.2  NEUTROABS 9.0*  --   HGB 13.1 12.0  HCT 37.4 32.5*  MCV 94.7 93.7  PLT 553* 484*   Cardiac Enzymes: No results for input(s): CKTOTAL, CKMB, CKMBINDEX, TROPONINI in the last 168 hours. CBG: No results for input(s): GLUCAP in the last 168 hours.  Iron Studies: No results for input(s): IRON, TIBC, TRANSFERRIN, FERRITIN in the last 72 hours. Studies/Results: Ct Angio Head W Or Wo Contrast  Result Date: 12/09/2018 CLINICAL DATA:  Initial evaluation for acute speech difficulty. Left-sided weakness. EXAM: CT ANGIOGRAPHY HEAD AND NECK TECHNIQUE: Multidetector CT imaging of the head and neck was performed using the standard protocol during bolus administration of intravenous contrast. Multiplanar CT image reconstructions and MIPs were obtained to evaluate the vascular anatomy. Carotid stenosis measurements (when applicable) are obtained utilizing NASCET criteria, using the distal internal carotid diameter as the denominator. CONTRAST:  6mL OMNIPAQUE IOHEXOL 350 MG/ML SOLN COMPARISON:   Prior head CT from earlier the same day. FINDINGS: CT HEAD FINDINGS Brain: Examination somewhat degraded by motion artifact. Age-related cerebral atrophy with moderate chronic microvascular ischemic disease. No acute intracranial hemorrhage. No acute large vessel territory infarct. No mass lesion, midline shift or mass effect. No hydrocephalus. No extra-axial fluid collection. Vascular: No hyperdense vessel. Scattered vascular calcifications noted within the carotid siphons. Skull: Scalp soft tissues and calvarium within normal limits. Sinuses: Chronic left sphenoid sinusitis noted. Visualized paranasal sinuses are otherwise clear. No mastoid effusion. Orbits: Globes and orbital soft tissues demonstrate no acute finding. Review of the MIP images confirms the above findings CTA NECK FINDINGS Aortic arch: Visualized aortic arch normal in caliber with normal branch pattern. Moderate atherosclerotic change about the aortic arch and origin of the great vessels without hemodynamically significant stenosis. Visualized subclavian arteries widely patent. Right carotid system: Right CCA tortuous proximally but widely patent to the bifurcation without stenosis. No significant atheromatous narrowing about the right bifurcation. Focal pseudoaneurysm extending from the medial aspect of the mid-distal right ICA measures 7 x 4 x 6 mm (series 11, image 200). No significant intraluminal thrombus or intimal irregularity. Right ICA otherwise widely patent without stenosis or other acute abnormality. Left carotid system: Left CCA tortuous proximally but widely patent to the bifurcation without stenosis. No significant atheromatous narrowing about the left bifurcation. Left ICA widely patent distally to the skull base without stenosis, dissection, or occlusion. Vertebral arteries: Both vertebral arteries arise from the subclavian arteries. Mixed plaque at the origin of the left vertebral artery with associated stenosis of up to  approximately 50%. Additional moderate multifocal areas of narrowing within the right V2 segment at the levels of C5 and C3, and on the left at C1 related to intrinsic compression from uncovertebral disease. Vertebral arteries otherwise patent without stenosis or vascular occlusion. Skeleton: No acute osseous abnormality. No discrete lytic or blastic osseous lesions. Moderate multilevel cervical spondylosis at C4-5 through C6-7, with ankylosis of the C4 and C5 vertebral bodies. Prominent degenerative changes about the C1-2 articulation. Other neck: No other acute soft tissue abnormality within the neck. No mass lesion or adenopathy. Subcentimeter hypodense nodule within the inferior right thyroid lobe noted, of doubtful significance given size. Upper chest: Moderate to advanced upper lobe predominant emphysema. Visualized upper chest demonstrates no acute finding. Review of the MIP images confirms the above findings CTA HEAD FINDINGS Anterior circulation: Petrous segments patent bilaterally. Scattered atheromatous plaque within the cavernous/supraclinoid ICAs with secondary mild to  moderate multifocal narrowing. No hemodynamically significant stenosis. ICA termini well perfused. A1 segments patent bilaterally. Right A1 hypoplastic, accounting for the slightly diminutive right ICA is compared to the left. Normal anterior communicating artery complex. Anterior cerebral arteries patent to their distal aspects without stenosis. Right M1 widely patent. Normal right MCA bifurcation. On coronal sequence, there appears to be focal occlusion of a proximal right M2 or possibly M3 branch at the base of the right sylvian fissure (series 12, image 88). a this is difficult to see on corresponding axial and sagittal sequence. Distal right MCA branches otherwise well perfused. Left M1 segment widely patent. Normal left MCA bifurcation. Distal left MCA branches well perfused and fairly symmetric with the right. Posterior  circulation: Vertebral arteries widely patent to the vertebrobasilar junction without stenosis. Both PICA is patent bilaterally. 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 Basilar widely patent to its distal aspect. Superior cerebral arteries patent bilaterally. Both PCAs primarily supplied via the basilar and are well perfused to their distal aspects without stenosis. Venous sinuses: Grossly patent allowing for timing the contrast bolus. Poor filling of the left sigmoid sinus and proximal internal jugular vein felt to be related to timing of the contrast bolus. No hyperdensity seen within this region on corresponding noncontrast head CT. Anatomic variants: Hypoplastic right A1 segment. No intracranial aneurysm. Review of the MIP images confirms the above findings IMPRESSION: 1. Focal occlusion of a proximal right M2/M3 branch at the base of the right sylvian fissure as detailed above, concerning for LVO. 2. 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 No significant intraluminal thrombus or intimal irregularity identified. 3. Mild to moderate atherosclerotic change about the aortic arch and carotid siphons without hemodynamically significant stenosis. 4.  Emphysema (ICD10-J43.9). Critical Value/emergent results were called by telephone at the time of interpretation on 12/09/2018 at 10:20 pm to providerDr. Rory Percy, Who verbally acknowledged these results. Electronically Signed   By: Jeannine Boga M.D.   On: 12/09/2018 22:38   Ct Head Wo Contrast  Result Date: 12/09/2018 CLINICAL DATA:  73 year old female with history of speech difficulty for the past day. EXAM: CT HEAD WITHOUT CONTRAST TECHNIQUE: Contiguous axial images were obtained from the base of the skull through the vertex without intravenous contrast. COMPARISON:  Head CT 11/12/2017. FINDINGS: Brain: In the inferior aspects of the frontal lobes of the  brain bilaterally (right greater than left) there are poorly find areas of low attenuation and disruption of normal gray-white differentiation, which appear slightly more conspicuous than prior study 11/11/2017. Patchy and confluent areas of decreased attenuation are noted throughout the deep and periventricular white matter of the cerebral hemispheres bilaterally, compatible with chronic microvascular ischemic disease. No evidence of acute infarction, hemorrhage, hydrocephalus, extra-axial collection or mass lesion/mass effect. Vascular: No hyperdense vessel or unexpected calcification. Skull: Normal. Negative for fracture or focal lesion. Sinuses/Orbits: No acute finding. Other: None. IMPRESSION: 1. Ill-defined areas of low attenuation in the frontal lobes bilaterally (right greater than left) with loss of gray-white differentiation, increased compared to prior study 11/11/2017. Areas of age-indeterminate ischemia in these regions are not excluded. If there is clinical concern for acute ischemia, further evaluation with brain MRI could be considered. 2. Extensive chronic microvascular ischemic changes throughout the cerebral white matter redemonstrated. Critical Value/emergent results were called by telephone at the time of interpretation on 12/09/2018 at 8:06 pm to [place] , who verbally acknowledged these results. Electronically  Signed   By: Vinnie Langton M.D.   On: 12/09/2018 20:06   Ct Angio Neck W And/or Wo Contrast  Result Date: 12/09/2018 CLINICAL DATA:  Initial evaluation for acute speech difficulty. Left-sided weakness. EXAM: CT ANGIOGRAPHY HEAD AND NECK TECHNIQUE: Multidetector CT imaging of the head and neck was performed using the standard protocol during bolus administration of intravenous contrast. Multiplanar CT image reconstructions and MIPs were obtained to evaluate the vascular anatomy. Carotid stenosis measurements (when applicable) are obtained utilizing NASCET criteria,  using the distal internal carotid diameter as the denominator. CONTRAST:  22mL OMNIPAQUE IOHEXOL 350 MG/ML SOLN COMPARISON:  Prior head CT from earlier the same day. FINDINGS: CT HEAD FINDINGS Brain: Examination somewhat degraded by motion artifact. Age-related cerebral atrophy with moderate chronic microvascular ischemic disease. No acute intracranial hemorrhage. No acute large vessel territory infarct. No mass lesion, midline shift or mass effect. No hydrocephalus. No extra-axial fluid collection. Vascular: No hyperdense vessel. Scattered vascular calcifications noted within the carotid siphons. Skull: Scalp soft tissues and calvarium within normal limits. Sinuses: Chronic left sphenoid sinusitis noted. Visualized paranasal sinuses are otherwise clear. No mastoid effusion. Orbits: Globes and orbital soft tissues demonstrate no acute finding. Review of the MIP images confirms the above findings CTA NECK FINDINGS Aortic arch: Visualized aortic arch normal in caliber with normal branch pattern. Moderate atherosclerotic change about the aortic arch and origin of the great vessels without hemodynamically significant stenosis. Visualized subclavian arteries widely patent. Right carotid system: Right CCA tortuous proximally but widely patent to the bifurcation without stenosis. No significant atheromatous narrowing about the right bifurcation. Focal pseudoaneurysm extending from the medial aspect of the mid-distal right ICA measures 7 x 4 x 6 mm (series 11, image 200). No significant intraluminal thrombus or intimal irregularity. Right ICA otherwise widely patent without stenosis or other acute abnormality. Left carotid system: Left CCA tortuous proximally but widely patent to the bifurcation without stenosis. No significant atheromatous narrowing about the left bifurcation. Left ICA widely patent distally to the skull base without stenosis, dissection, or occlusion. Vertebral arteries: Both vertebral arteries arise from  the subclavian arteries. Mixed plaque at the origin of the left vertebral artery with associated stenosis of up to approximately 50%. Additional moderate multifocal areas of narrowing within the right V2 segment at the levels of C5 and C3, and on the left at C1 related to intrinsic compression from uncovertebral disease. Vertebral arteries otherwise patent without stenosis or vascular occlusion. Skeleton: No acute osseous abnormality. No discrete lytic or blastic osseous lesions. Moderate multilevel cervical spondylosis at C4-5 through C6-7, with ankylosis of the C4 and C5 vertebral bodies. Prominent degenerative changes about the C1-2 articulation. Other neck: No other acute soft tissue abnormality within the neck. No mass lesion or adenopathy. Subcentimeter hypodense nodule within the inferior right thyroid lobe noted, of doubtful significance given size. Upper chest: Moderate to advanced upper lobe predominant emphysema. Visualized upper chest demonstrates no acute finding. Review of the MIP images confirms the above findings CTA HEAD FINDINGS Anterior circulation: Petrous segments patent bilaterally. Scattered atheromatous plaque within the cavernous/supraclinoid ICAs with secondary mild to moderate multifocal narrowing. No hemodynamically significant stenosis. ICA termini well perfused. A1 segments patent bilaterally. Right A1 hypoplastic, accounting for the slightly diminutive right ICA is compared to the left. Normal anterior communicating artery complex. Anterior cerebral arteries patent to their distal aspects without stenosis. Right M1 widely patent. Normal right MCA bifurcation. On coronal sequence, there appears to be focal occlusion of a proximal right M2  or possibly M3 branch at the base of the right sylvian fissure (series 12, image 88). a this is difficult to see on corresponding axial and sagittal sequence. Distal right MCA branches otherwise well perfused. Left M1 segment widely patent. Normal left  MCA bifurcation. Distal left MCA branches well perfused and fairly symmetric with the right. Posterior circulation: Vertebral arteries widely patent to the vertebrobasilar junction without stenosis. Both PICA is patent bilaterally. 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 Basilar widely patent to its distal aspect. Superior cerebral arteries patent bilaterally. Both PCAs primarily supplied via the basilar and are well perfused to their distal aspects without stenosis. Venous sinuses: Grossly patent allowing for timing the contrast bolus. Poor filling of the left sigmoid sinus and proximal internal jugular vein felt to be related to timing of the contrast bolus. No hyperdensity seen within this region on corresponding noncontrast head CT. Anatomic variants: Hypoplastic right A1 segment. No intracranial aneurysm. Review of the MIP images confirms the above findings IMPRESSION: 1. Focal occlusion of a proximal right M2/M3 branch at the base of the right sylvian fissure as detailed above, concerning for LVO. 2. 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 No significant intraluminal thrombus or intimal irregularity identified. 3. Mild to moderate atherosclerotic change about the aortic arch and carotid siphons without hemodynamically significant stenosis. 4.  Emphysema (ICD10-J43.9). Critical Value/emergent results were called by telephone at the time of interpretation on 12/09/2018 at 10:20 pm to providerDr. Rory Percy, Who verbally acknowledged these results. Electronically Signed   By: Jeannine Boga M.D.   On: 12/09/2018 22:38   Mr Brain Wo Contrast  Result Date: 12/10/2018 CLINICAL DATA:  Speech difficulty. EXAM: MRI HEAD WITHOUT CONTRAST TECHNIQUE: Multiplanar, multiecho pulse sequences of the brain and surrounding structures were obtained without intravenous contrast. COMPARISON:  CT and CTA from yesterday  FINDINGS: Brain: No acute infarction, hemorrhage, hydrocephalus, extra-axial collection or mass lesion. 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 Chronic small vessel ischemic gliosis has become more confluent in the deep cerebral white matter. Stable age normal brain volume. Vascular: Preserved flow voids.  There was CTA yesterday Skull and upper cervical spine: Normal marrow signal. Cervical facet spurring with C3-4 anterolisthesis. Sinuses/Orbits: Bilateral cataract resection Other: Overall mild intermittent motion degradation IMPRESSION: 1. No acute finding. 2. Moderate chronic small vessel ischemia, including remote small vessel infarcts, that has progressed from 2013. Electronically Signed   By: Monte Fantasia M.D.   On: 12/10/2018 04:18   Ct Cerebral Perfusion W Contrast  Result Date: 12/09/2018 CLINICAL DATA:  Initial evaluation for acute aphasia, left-sided weakness. EXAM: CT PERFUSION BRAIN TECHNIQUE: Multiphase CT imaging of the brain was performed following IV bolus contrast injection. Subsequent parametric perfusion maps were calculated using RAPID software. CONTRAST:  28mL OMNIPAQUE IOHEXOL 350 MG/ML SOLN COMPARISON:  Prior CT and CTA from earlier same day. FINDINGS: CT Brain Perfusion Findings: CBF (<30%) Volume: 37mL Perfusion (Tmax>6.0s) volume: 72mL Mismatch Volume: 34mL ASPECTS on noncontrast CT Head: No aspects score provided. Infarct Core: 0 mL Infarction Location:Negative CT perfusion for acute core infarct or perfusion deficit. Upon further review of prior CTA, previously noted possible right M2/M3 occlusion felt to most likely reflect focal vascular tortuosity with associated stenosis. Attenuated but patent flow is seen distally. IMPRESSION: 1. Negative CT perfusion for acute core infarct or perfusion abnormality. 2. Upon  further consideration and in conjunction with these perfusion findings,  previously questioned right M2/M3 occlusion felt to be most consistent with focal vascular tortuosity rather than occlusion. These results were communicated to Dr. Rory Percy at 10:52 pmon 10/18/2020by text page via the [facility] messaging system. Electronically Signed   By: Jeannine Boga M.D.   On: 12/09/2018 22:54   Medications: Infusions:   Scheduled Medications: . amLODipine  5 mg Oral Daily  . aspirin EC  81 mg Oral Daily  . cloNIDine  0.1 mg Oral BID  . enoxaparin (LOVENOX) injection  40 mg Subcutaneous Q24H  . feeding supplement (ENSURE ENLIVE)  237 mL Oral BID BM  . folic acid  1 mg Oral Daily  . letrozole  2.5 mg Oral Daily  . loratadine  10 mg Oral Daily  . LORazepam  0-4 mg Intravenous Q6H   Followed by  . [START ON 12/12/2018] LORazepam  0-4 mg Intravenous Q12H  . methylphenidate  20 mg Oral Daily  . metoprolol succinate  100 mg Oral Daily  . multivitamin with minerals  1 tablet Oral Daily  . nicotine  21 mg Transdermal Daily  . [START ON 12/12/2018] oxybutynin  10 mg Oral Daily  . pantoprazole  40 mg Oral Daily  . senna-docusate  1 tablet Oral QHS  . thiamine  100 mg Oral Daily   Or  . thiamine  100 mg Intravenous Daily    have reviewed scheduled and prn medications.  Physical Exam: General: alert, pressured speech which is fluent at times, off on tangents Heart: RRR Lungs: clear Abdomen: soft, non tender Extremities: no edema    12/11/2018,9:04 AM  LOS: 2 days

## 2018-12-11 NOTE — Progress Notes (Signed)
OT Cancellation Note  Patient Details Name: Mindy Young MRN: [id number] DOB: 30-Mar-1945   Cancelled Treatment:    Reason Eval/Treat Not Completed: Patient at procedure or test/ unavailable(EEG) Pt is continuous EEG currently so therapy holding for more appropriate time  Richelle Ito, OTR/L  Acute Rehabilitation Services Pager: [phone] Office: [phone] .  12/11/2018, 11:56 AM

## 2018-12-11 NOTE — Progress Notes (Signed)
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 This lasted less than 30 seconds.   Was unable to get her words out fluently.  She started talking about seeing the ducks and was nonsensical for a couple seconds then she was back at her baseline.  She is able to communicate at this time.  Her vitals are stable and she denied pain.  She was able to recall 3 purple cows.

## 2018-12-12 DIAGNOSIS — R569 Unspecified convulsions: Secondary | ICD-10-CM | POA: Diagnosis not present

## 2018-12-12 DIAGNOSIS — I10 Essential (primary) hypertension: Secondary | ICD-10-CM | POA: Diagnosis not present

## 2018-12-12 LAB — CBC
HCT: 33.7 % — ABNORMAL LOW (ref 36.0–46.0)
Hemoglobin: 11.7 g/dL — ABNORMAL LOW (ref 12.0–15.0)
MCH: 33.1 pg (ref 26.0–34.0)
MCHC: 34.7 g/dL (ref 30.0–36.0)
MCV: 95.2 fL (ref 80.0–100.0)
Platelets: 490 10*3/uL — ABNORMAL HIGH (ref 150–400)
RBC: 3.54 MIL/uL — ABNORMAL LOW (ref 3.87–5.11)
RDW: 11.8 % (ref 11.5–15.5)
WBC: 9.7 10*3/uL (ref 4.0–10.5)
nRBC: 0 % (ref 0.0–0.2)

## 2018-12-12 LAB — BASIC METABOLIC PANEL
Anion gap: 11 (ref 5–15)
BUN: 7 mg/dL — ABNORMAL LOW (ref 8–23)
CO2: 23 mmol/L (ref 22–32)
Calcium: 8.9 mg/dL (ref 8.9–10.3)
Chloride: 94 mmol/L — ABNORMAL LOW (ref 98–111)
Creatinine, Ser: 0.53 mg/dL (ref 0.44–1.00)
GFR calc Af Amer: >60 mL/min (ref >60)
GFR calc non Af Amer: >60 mL/min (ref >60)
Glucose, Bld: 100 mg/dL — ABNORMAL HIGH (ref 70–99)
Potassium: 3.7 mmol/L (ref 3.5–5.1)
Sodium: 128 mmol/L — ABNORMAL LOW (ref 135–145)

## 2018-12-12 MED ORDER — VITAMIN B-6 50 MG PO TABS
100.0000 mg | ORAL_TABLET | Freq: Every day | ORAL | Status: DC
  Administered 2018-12-12 – 2018-12-13 (×2): 100 mg via ORAL
  Filled 2018-12-12 (×2): qty 2

## 2018-12-12 MED ORDER — CLONIDINE HCL 0.1 MG PO TABS
0.1000 mg | ORAL_TABLET | Freq: Every day | ORAL | Status: DC
  Administered 2018-12-12: 0.1 mg via ORAL
  Filled 2018-12-12: qty 1

## 2018-12-12 MED ORDER — WHITE PETROLATUM EX OINT
TOPICAL_OINTMENT | CUTANEOUS | Status: AC
  Administered 2018-12-13: 1
  Filled 2018-12-12: qty 28.35

## 2018-12-12 MED ORDER — LEVETIRACETAM 500 MG PO TABS
1000.0000 mg | ORAL_TABLET | Freq: Two times a day (BID) | ORAL | Status: DC
  Administered 2018-12-12 – 2018-12-13 (×2): 1000 mg via ORAL
  Filled 2018-12-12 (×2): qty 2

## 2018-12-12 NOTE — Care Management Important Message (Signed)
Important Message  Patient Details  Name: Mindy Young MRN: [id number] Date of Birth: 1945-09-23   Medicare Important Message Given:  Yes     Orbie Pyo 12/12/2018, 2:06 PM

## 2018-12-12 NOTE — Evaluation (Signed)
Speech Language Pathology Evaluation Patient Details Name: Mindy Young MRN: [id number] DOB: Jul 08, 1945 Today's Date: 12/12/2018 Time: [id number] SLP Time Calculation (min) (ACUTE ONLY): 30 min  Problem List:  Patient Active Problem List   Diagnosis Date Noted  . GERD (gastroesophageal reflux disease) 12/09/2018  . Anxiety 12/09/2018  . Difficulty speaking 12/09/2018  . Leukocytosis 12/09/2018  . Seizure-like activity ([place]) 12/09/2018  . Pelvic fracture ([place]) 11/25/2018  . Pubic bone fracture ([place]) 11/24/2018  . Left knee pain 03/29/2018  . Unilateral primary osteoarthritis, left knee 03/29/2018  . Genetic testing 01/24/2018  . Family history of breast cancer   . Family history of prostate cancer   . Malignant neoplasm of lower-inner quadrant of right breast of female, estrogen receptor positive ([place]) 10/24/2017  . Anemia 06/29/2011  . Hypertension   . Hyponatremia 06/25/2011  . Transaminitis 06/25/2011  . Alcohol abuse 06/25/2011  . Cough 06/25/2011  . Cigarette smoker 06/25/2011   Past Medical History:  Past Medical History:  Diagnosis Date  . Allergy   . Anemia 06/29/2011  . Anxiety   . Arthritis   . Breast cancer ([place])    right breast  . C1 cervical fracture ([place]) 02/1997  . Cancer ([place])    melanoma  . Depression   . Family history of breast cancer   . Family history of prostate cancer   . GERD (gastroesophageal reflux disease)   . Hypertension   . Tardive dyskinesia    Past Surgical History:  Past Surgical History:  Procedure Laterality Date  . ABDOMINAL HYSTERECTOMY    . ASPIRATION OF ABSCESS Right 11/20/2017   Procedure: ASPIRATION OF RIGHT AXILLARY SEROMA;  Surgeon: Rolm Bookbinder, MD;  Location: [facility];  Service: General;  Laterality: Right;  . AUGMENTATION MAMMAPLASTY Bilateral    biateral implants , approx 2015  . BREAST LUMPECTOMY Right 11/02/2017   re-ex 11-20-17  . BREAST LUMPECTOMY WITH RADIOACTIVE SEED AND SENTINEL LYMPH  NODE BIOPSY Right 11/02/2017   Procedure: BREAST LUMPECTOMY WITH RADIOACTIVE SEED AND SENTINEL LYMPH NODE BIOPSY;  Surgeon: Rolm Bookbinder, MD;  Location: [facility];  Service: General;  Laterality: Right;  . CHOLECYSTECTOMY    . collarbone    . INCONTINENCE SURGERY    . KNEE SURGERY     removal of cyst, repair of cartiledge  . LAPAROSCOPY     for endometriosis  . RE-EXCISION OF BREAST LUMPECTOMY Right 11/20/2017   Procedure: RE-EXCISION OF RIGHT BREAST MARGINS;  Surgeon: Rolm Bookbinder, MD;  Location: [facility];  Service: General;  Laterality: Right;  . ROTATOR CUFF REPAIR     left   HPI:  Mindy Young is a 73 y.o. female with medical history significant of hypertension, GERD, depression, anxiety, tardive dyskinesia, C1 cervical spine fracture, breast cancer (right lumpectomy), alcohol and tobacco abuse, hyponatremia, recent pubic bone fracture, who presents with difficulty speaking and seizure-like activity. MRI = no acute finding   Assessment / Plan / Recommendation Clinical Impression  Pt presents with deficits in short term recall, thought organization, mental calculations, and higher levels of cognition including reasoning, problem solving and executive functions. Prior to admit, pt was independently managing finances and medications, and had no cognitive deficits. Continued skilled ST intervention is recommended to maximize function and safety and facilitate return to independence.    SLP Assessment  SLP Recommendation/Assessment: Patient needs continued Speech Language Pathology Services SLP Visit Diagnosis: Cognitive communication deficit (R41.841)    Follow Up Recommendations  (TBD)  Frequency and Duration min 1 x/week  2 weeks      SLP Evaluation Cognition  Overall Cognitive Status: Impaired/Different from baseline Arousal/Alertness: Awake/alert Orientation Level: Oriented X4 Attention: Focused;Sustained Focused Attention:  Appears intact Sustained Attention: Appears intact Memory: Impaired Memory Impairment: Retrieval deficit;Decreased short term memory;Decreased recall of new information Decreased Short Term Memory: Verbal basic Awareness: Appears intact Problem Solving: Impaired Problem Solving Impairment: Verbal basic Executive Function: Reasoning;Organizing Reasoning: Impaired Reasoning Impairment: Verbal basic Organizing: Impaired Organizing Impairment: Verbal basic Safety/Judgment: Appears intact       Comprehension  Auditory Comprehension Overall Auditory Comprehension: Appears within functional limits for tasks assessed    Expression Expression Primary Mode of Expression: Verbal Verbal Expression Overall Verbal Expression: Appears within functional limits for tasks assessed Written Expression Dominant Hand: Right   Oral / Motor  Oral Motor/Sensory Function Overall Oral Motor/Sensory Function: Within functional limits Motor Speech Overall Motor Speech: Appears within functional limits for tasks assessed   GO                   Tyan Lasure B. Quentin Ore, [facility], [place] Speech Language Pathologist Office: [phone] Pager: [phone]  Shonna Chock 12/12/2018, 2:11 PM

## 2018-12-12 NOTE — Progress Notes (Signed)
LTM maintenance completed; checked FP1 and Fp2 as they were high; also checked P4 ad T4. Took head wrap off and rewrapped per patient request- wrap was too tight and under her chin; rewrapped and used tape on sides instead. No skin breakdown was seen.

## 2018-12-12 NOTE — Progress Notes (Signed)
PROGRESS NOTE    Mindy Young  [id number] DOB: [birth year] DOA: 12/09/2018 PCP: Lennie Odor, PA-C     Brief Narrative:  Mindy Young is a 73 year old female with past medical history of hypertension, anxiety/depression, breast cancer, alcohol abuse, tobacco use, C1 cervical spine fracture, chronic hyponatremia, recent pubic bone fracture, chronic pain on narcotics and muscle relaxers as well as Xanax, tardive dyskinesia who was brought to the ED on 10/18 who presented with difficulty speaking and seizure-like activity.  She was seen having difficulty speaking and stuttering about 11:30 AM prior to admission and has had 3 additional similar prior episodes.  Patient had a unilateral left-sided gaze not responding while her eyes were open but denied unilateral weakness or numbness in the extremities.  In the ED was found to have WBC 12.7, negative UA, negative EtOH, sodium 121, CT head and neck with questionable right M2 stenosis versus occlusion versus tortuous vessel artifact, CT brain perfusion negative.  Neurology was consulted.  Patient had EEG which was negative.  MRI brain without acute findings.  Nephrology was consulted for acute on chronic hyponatremia with seizure-like activity thought to be symptomatic from hyponatremia.  Patient underwent continuous EEG and had confirmed seizures.  Patient has been on Keppra as well as pyridoxine.  New events last 24 hours / Subjective: Patient seen with daughter at bedside.  LTM EEG in process.  Patient relays that she has been trying to cut down on her water/ice chips intact but this has been difficult due to dry mouth caused by medication for her tardive dyskinesia.  Assessment & Plan:   Principal Problem:   Seizure-like activity ([place]) Active Problems:   Hyponatremia   Alcohol abuse   Cigarette smoker   Hypertension   Malignant neoplasm of lower-inner quadrant of right breast of female, estrogen receptor positive ([facility])   GERD  (gastroesophageal reflux disease)   Anxiety   Difficulty speaking   Leukocytosis    New onset confirmed seizures  -Confirmed on long-term EEG monitoring. -Keppra 1000 mg twice daily, pyridoxine 100 mg daily while on keppra  -Continue long-term EEG monitoring to monitor for subclinical seizures per neurology.  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 -Per nephrology: Hold Effexor, 2 L fluid restriction, hold normal saline continue to trend sodium -Sodium goal of 130  Alcohol use in setting of depression  -Has had an increase in alcohol intake over the past year with several stressors including passing of her father from covid, daughter moving away  -Counseled on alcohol cessation -CIWA protocol  Tobacco use -Counseled on cessation -Nicotine patch  Depression -Holding Effexor as above -Follow-up with outpatient psych  Hypertension -Continue home amlodipine, clonidine, metoprolol  Right breast cancer, ER positive -Continue letrozole  GERD -Protonix  Anxiety -On alprazolam 0.5 mg twice daily as needed, monitor for overdose if she receives Ativan per CIWA protocol   DVT prophylaxis: Lovenox Code Status: DO NOT INTUBATE Family Communication: Daughter at bedside Disposition Plan: Pending neurology signed off   Consultants:   Neurology  Procedures:   None  Antimicrobials:  Anti-infectives (From admission, onward)   None       Objective: Vitals:   12/11/18 2335 12/12/18 0327 12/12/18 0901 12/12/18 1236  BP: 133/86 139/83 122/75   Pulse: 81 92 87   Resp: 18 18  16   Temp: 97.8 F (36.6 C) 98 F (36.7 C) (!) 97.2 F (36.2 C) 98.2 F (36.8 C)  TempSrc: Oral Oral Axillary Oral  SpO2: 100% 99% 97%   Weight:       Height:        Intake/Output Summary (Last 24 hours) at 12/12/2018 1349 Last data filed at 12/12/2018 0600 Gross per 24 hour  Intake 400 ml  Output 2150 ml  Net -1750 ml   Filed Weights   12/09/18 1814  Weight: 68 kg    Examination:  General exam: Appears calm and comfortable  Respiratory system: Clear to auscultation. Respiratory effort normal. No respiratory distress. No conversational dyspnea.  Cardiovascular system: S1 & S2 heard, RRR. No murmurs. No pedal edema. Gastrointestinal system: Abdomen is nondistended, soft and nontender. Normal bowel sounds heard. Central nervous system: Alert and oriented. No focal neurological deficits. Speech clear.  Extremities: Symmetric in appearance  Skin: No rashes, lesions or ulcers on exposed skin  Psychiatry: Judgement and insight appear stable   Data Reviewed: I have personally reviewed following labs and imaging studies  CBC: Recent Labs  Lab 12/09/18 1910 12/11/18 0239 12/12/18 0614  WBC 12.7* 9.2 9.7  NEUTROABS 9.0*  --   --   HGB 13.1 12.0 11.7*  HCT 37.4 32.5* 33.7*  MCV 94.7 93.7 95.2  PLT 553* 484* 123XX123*   Basic Metabolic Panel: Recent Labs  Lab 12/10/18 0812  12/11/18 0239 12/11/18 0939 12/11/18 1400 12/11/18 2057 12/12/18 0614  NA 126*   < > 127* 125* 126* 125* 128*  K 3.6   < > 4.3 3.4* 3.3* 3.5 3.7  CL 95*   < > 92* 90* 90* 91* 94*  CO2 22   < > 25 23 23 22 23   GLUCOSE 108*   < > 95 107* 142* 99 100*  BUN 6*   < > 8 <5* 9 7* 7*  CREATININE 0.54   < > 0.58 0.57 0.48 0.53 0.53  CALCIUM 8.5*   < > 8.9 8.9 8.9 8.9 8.9  MG 1.7  --   --   --   --   --   --   PHOS 3.8  --   --   --   --   --   --    < > = values in this interval not displayed.   GFR: Estimated Creatinine Clearance: 57.9 mL/min (by C-G formula based on SCr of 0.53 mg/dL). Liver Function Tests: Recent Labs  Lab 12/09/18 1910  AST 24  ALT 19  ALKPHOS 146*  BILITOT 0.7  PROT 7.4  ALBUMIN 3.8   No results for input(s): LIPASE,  AMYLASE in the last 168 hours. No results for input(s): AMMONIA in the last 168 hours. Coagulation Profile: Recent Labs  Lab 12/09/18 2213  INR 1.0   Cardiac Enzymes: No results for input(s): CKTOTAL, CKMB, CKMBINDEX, TROPONINI in the last 168 hours. BNP (last 3 results) No results for input(s): PROBNP in the last 8760 hours. HbA1C: Recent Labs    12/10/18 0211  HGBA1C 6.0*   CBG: No results for input(s): GLUCAP in the last 168 hours. Lipid Profile: Recent Labs    12/10/18 0211  CHOL 157  HDL 59  LDLCALC 88  TRIG 51  CHOLHDL 2.7   Thyroid Function Tests: Recent Labs    12/10/18 0211  TSH 0.974   Anemia Panel: No results for input(s): VITAMINB12, FOLATE, FERRITIN, TIBC, IRON, RETICCTPCT in the last 72 hours. Sepsis Labs:  No results for input(s): PROCALCITON, LATICACIDVEN in the last 168 hours.  Recent Results (from the past 240 hour(s))  SARS CORONAVIRUS 2 (TAT 6-24 HRS) Nasopharyngeal Nasopharyngeal Swab     Status: None   Collection Time: 12/09/18 11:29 PM   Specimen: Nasopharyngeal Swab  Result Value Ref Range Status   SARS Coronavirus 2 NEGATIVE NEGATIVE Final    Comment: (NOTE) SARS-CoV-2 target nucleic acids are NOT DETECTED. The SARS-CoV-2 RNA is generally detectable in upper and lower respiratory specimens during the acute phase of infection. Negative results do not preclude SARS-CoV-2 infection, do not rule out co-infections with other pathogens, and should not be used as the sole basis for treatment or other patient management decisions. Negative results must be combined with clinical observations, patient history, and epidemiological information. The expected result is Negative. Fact Sheet for Patients: SugarRoll.be Fact Sheet for Healthcare Providers: https://www.woods-mathews.com/ This test is not yet approved or cleared by the [place] FDA and  has been authorized for detection and/or diagnosis of  SARS-CoV-2 by FDA under an Emergency Use Authorization (EUA). This EUA will remain  in effect (meaning this test can be used) for the duration of the COVID-19 declaration under Section 56 4(b)(1) of the Act, 21 U.S.C. section 360bbb-3(b)(1), unless the authorization is terminated or revoked sooner. Performed at [facility], [place] [address] [place], [place] [address]       Radiology Studies: No results found.    Scheduled Meds: . amLODipine  5 mg Oral Daily  . aspirin EC  81 mg Oral Daily  . cloNIDine  0.1 mg Oral QHS  . enoxaparin (LOVENOX) injection  40 mg Subcutaneous Q24H  . feeding supplement (ENSURE ENLIVE)  237 mL Oral BID BM  . folic acid  1 mg Oral Daily  . letrozole  2.5 mg Oral Daily  . levETIRAcetam  1,000 mg Oral BID  . loratadine  10 mg Oral Daily  . LORazepam  0-4 mg Intravenous Q12H  . methylphenidate  20 mg Oral Daily  . metoprolol succinate  100 mg Oral Daily  . multivitamin with minerals  1 tablet Oral Daily  . nicotine  21 mg Transdermal Daily  . oxybutynin  10 mg Oral Daily  . pantoprazole  40 mg Oral Daily  . vitamin B-6  100 mg Oral Daily  . senna-docusate  1 tablet Oral QHS  . thiamine  100 mg Oral Daily   Continuous Infusions:   LOS: 3 days      Time spent: 40 minutes   Dessa Phi, DO [facility] 12/12/2018, 1:49 PM   Available via Epic secure chat 7am-7pm After these hours, please refer to coverage provider listed on [email or web address]

## 2018-12-12 NOTE — Progress Notes (Signed)
Subjective:  Sodium 125-128 last 24 hours. Improves overnight when she is not drinking    Objective Vital signs in last 24 hours: Vitals:   12/11/18 1638 12/11/18 1928 12/11/18 2335 12/12/18 0327  BP:  (!) 138/95 133/86 139/83  Pulse: 87 86 81 92  Resp:  18 18 18   Temp: 98.1 F (36.7 C) 97.6 F (36.4 C) 97.8 F (36.6 C) 98 F (36.7 C)  TempSrc: Oral Oral Oral Oral  SpO2: 100% 100% 100% 99%  Weight:      Height:       Weight change:   Intake/Output Summary (Last 24 hours) at 12/12/2018 0747 Last data filed at 12/12/2018 0600 Gross per 24 hour  Intake 400 ml  Output 2900 ml  Net -2500 ml    Assessment/Plan: 73 year old white female with multiple psych issues and chronic pain on multiple meds presenting with hyponatremia 1)   Hyponatremia - so given the history of polydipsia, the low urine osmolality and a relatively rapid correction once brought to medical attention makes me think that polydipsia is primary driver.  There also could be a little bit of a tea and toast.  In addition, she was on a medication which can cause SIADH.   Would continue a  little bit of a fluid restriction, 2 L (have also decreased clonidine to at night only which may help her dry mouth and giving biotene). Have stopped the normal saline and stopped the Effexor as it can cause SIADH.  Overall better, I think close to 130 is probably going to be where she lives.   Her polydipsia is going to be a tough thing for her to modify I am afraid.  Will change sodium monitoring to daily and have reminded her to try and curb drinking and ice eating.  Renal will sign off, call with questions       Louis Meckel    Labs: Basic Metabolic Panel: Recent Labs  Lab 12/10/18 0812  12/11/18 1400 12/11/18 2057 12/12/18 0614  NA 126*   < > 126* 125* 128*  K 3.6   < > 3.3* 3.5 3.7  CL 95*   < > 90* 91* 94*  CO2 22   < > 23 22 23   GLUCOSE 108*   < > 142* 99 100*  BUN 6*   < > 9 7* 7*  CREATININE 0.54   < >  0.48 0.53 0.53  CALCIUM 8.5*   < > 8.9 8.9 8.9  PHOS 3.8  --   --   --   --    < > = values in this interval not displayed.   Liver Function Tests: Recent Labs  Lab 12/09/18 1910  AST 24  ALT 19  ALKPHOS 146*  BILITOT 0.7  PROT 7.4  ALBUMIN 3.8   No results for input(s): LIPASE, AMYLASE in the last 168 hours. No results for input(s): AMMONIA in the last 168 hours. CBC: Recent Labs  Lab 12/09/18 1910 12/11/18 0239 12/12/18 0614  WBC 12.7* 9.2 9.7  NEUTROABS 9.0*  --   --   HGB 13.1 12.0 11.7*  HCT 37.4 32.5* 33.7*  MCV 94.7 93.7 95.2  PLT 553* 484* 490*   Cardiac Enzymes: No results for input(s): CKTOTAL, CKMB, CKMBINDEX, TROPONINI in the last 168 hours. CBG: No results for input(s): GLUCAP in the last 168 hours.  Iron Studies: No results for input(s): IRON, TIBC, TRANSFERRIN, FERRITIN in the last 72 hours. Studies/Results: No results found. Medications: Infusions: .  levETIRAcetam 1,000 mg (12/11/18 2156)    Scheduled Medications: . amLODipine  5 mg Oral Daily  . aspirin EC  81 mg Oral Daily  . cloNIDine  0.1 mg Oral BID  . enoxaparin (LOVENOX) injection  40 mg Subcutaneous Q24H  . feeding supplement (ENSURE ENLIVE)  237 mL Oral BID BM  . folic acid  1 mg Oral Daily  . letrozole  2.5 mg Oral Daily  . loratadine  10 mg Oral Daily  . LORazepam  0-4 mg Intravenous Q12H  . methylphenidate  20 mg Oral Daily  . metoprolol succinate  100 mg Oral Daily  . multivitamin with minerals  1 tablet Oral Daily  . nicotine  21 mg Transdermal Daily  . oxybutynin  10 mg Oral Daily  . pantoprazole  40 mg Oral Daily  . pyridOXINE  100 mg Intravenous Daily  . senna-docusate  1 tablet Oral QHS  . thiamine  100 mg Oral Daily    have reviewed scheduled and prn medications.  Physical Exam: General: resting Heart: RRR Lungs: clear Abdomen: soft, non tender Extremities: no edema    12/12/2018,7:47 AM  LOS: 3 days

## 2018-12-12 NOTE — Evaluation (Signed)
Occupational Therapy Evaluation Patient Details Name: Mindy Young MRN: [id number] DOB: February 03, 1946 Today's Date: 12/12/2018    History of Present Illness Mindy Young is a 73 y.o. female with medical history significant of hypertension, GERD, depression, anxiety, tardive dyskinesia, C1 cervical spine fracture, breast cancer (right lumpectomy), alcohol and tobacco abuse, hyponatremia, recent pubic bone fracture, who presents with difficulty speaking and seizure-like activity.   Clinical Impression   Pt PTA: Pt living alone, but daughter visits frequently. Pt A/O x4. Pt with fall history. Pt currently appears to answer questions appropriately.  Pt performed Short Blessed Test 4/28 resulting in normal cognition. Pt unable to say months of year consecutively without cues for errors. Pt currently performing ADL tasks with minA overall, pt refused to perform standing ADL, pt sat for toilet hygiene and denied need for grooming at EOB. Pt with poor activity tolerance and would benefit from improved ADL and mobility with continued OT. OT following acutely.    Follow Up Recommendations  Home health OT;Supervision/Assistance - 24 hour    Equipment Recommendations  None recommended by OT    Recommendations for Other Services       Precautions / Restrictions Precautions Precautions: Fall Restrictions Weight Bearing Restrictions: No      Mobility Bed Mobility Overal bed mobility: Needs Assistance Bed Mobility: Supine to Sit;Sit to Supine     Supine to sit: Min assist Sit to supine: Min assist   General bed mobility comments: minA for RLE management  Transfers Overall transfer level: Needs assistance Equipment used: Rolling walker (2 wheeled) Transfers: Sit to/from [facility] Sit to Stand: Min guard Stand pivot transfers: Min guard       General transfer comment: verbal cues for hand placement, min guard for balance, verbal cues for walker sequencing, decreased R  LE WBing    Balance Overall balance assessment: Needs assistance;History of Falls Sitting-balance support: Feet supported;No upper extremity supported Sitting balance-Leahy Scale: Fair     Standing balance support: During functional activity;Bilateral upper extremity supported Standing balance-Leahy Scale: Poor Standing balance comment: reliant on RW                           ADL either performed or assessed with clinical judgement   ADL Overall ADL's : Needs assistance/impaired Eating/Feeding: Modified independent   Grooming: Set up;Sitting   Upper Body Bathing: Set up;Sitting   Lower Body Bathing: Minimal assistance;Sitting/lateral leans;Sit to/from stand   Upper Body Dressing : Set up;Sitting   Lower Body Dressing: Minimal assistance;Sitting/lateral leans;Sit to/from stand;Cueing for safety   Toilet Transfer: Min guard;Cueing for safety;Stand-pivot Toilet Transfer Details (indicate cue type and reason): Pt required cues to turn around counter clockwise due to lines and IV Toileting- Clothing Manipulation and Hygiene: Min guard;Sitting/lateral lean       Functional mobility during ADLs: Min guard;Minimal assistance;Rolling walker;Cueing for safety;Cueing for sequencing General ADL Comments: Pt sat for LB ADL and refused to perform any standing ADL at this time.     Vision Baseline Vision/History: No visual deficits Vision Assessment?: No apparent visual deficits     Perception     Praxis      Pertinent Vitals/Pain Pain Assessment: Faces Faces Pain Scale: Hurts little more Pain Location: R LE during std pvt to chair Pain Descriptors / Indicators: Grimacing Pain Intervention(s): Limited activity within patient's tolerance;Monitored during session;Repositioned     Hand Dominance Right   Extremity/Trunk Assessment Upper Extremity Assessment Upper Extremity Assessment: Generalized  weakness   Lower Extremity Assessment Lower Extremity Assessment:  Defer to PT evaluation RLE Deficits / Details: Less ROM and increased pain   Cervical / Trunk Assessment Cervical / Trunk Assessment: Normal   Communication Communication Communication: No difficulties   Cognition Arousal/Alertness: Awake/alert Behavior During Therapy: WFL for tasks assessed/performed Overall Cognitive Status: No family/caregiver present to determine baseline cognitive functioning                                 General Comments: Pt appears to answer questions appropriately.  Pt performed Short Blessed Test 4/28 resulting in normal cognition. Pt unable to say months of year consecutively without cues for errors.   General Comments  VSS    Exercises     Shoulder Instructions      Home Living Family/patient expects to be discharged to:: Private residence Living Arrangements: Alone Available Help at Discharge: Family;Available 24 hours/day Type of Home: House Home Access: Stairs to enter [employer] of Steps: 5 Entrance Stairs-Rails: Right;Left;Can reach both Home Layout: One level     Bathroom Shower/Tub: Occupational psychologist: Standard     Home Equipment: Environmental consultant - 2 wheels;Tub bench   Additional Comments: has as service dog to help her manage her drops in BP, pt has had 24/7 assist since her fall and has HHPT coming out  Lives With: Alone;Other (Comment)(has service dog)    Prior Functioning/Environment Level of Independence: Independent with assistive device(s)        Comments: uses RW currently due to recent fall and broken pelvis        OT Problem List: Decreased strength;Decreased activity tolerance;Impaired balance (sitting and/or standing);Decreased safety awareness;Pain      OT Treatment/Interventions: Self-care/ADL training;Therapeutic exercise;Neuromuscular education;Energy conservation;Therapeutic activities;Patient/family education;Balance training    OT Goals(Current goals can be found in the  care plan section) Acute Rehab OT Goals Patient Stated Goal: to go home asap OT Goal Formulation: With patient Time For Goal Achievement: 12/26/18 Potential to Achieve Goals: Fair ADL Goals Pt Will Perform Grooming: with modified independence;standing Pt Will Perform Lower Body Dressing: with modified independence;sit to/from stand;sitting/lateral leans Additional ADL Goal #1: Pt will perform multi-step commands with no verbal cues with ADL tasks to further assess higher level cognition. Additional ADL Goal #2: Pt will increase to modified independence with OOB ADL.  OT Frequency: Min 2X/week   Barriers to D/C:            Co-evaluation              AM-PAC OT "6 Clicks" Daily Activity     Outcome Measure Help from another person eating meals?: None Help from another person taking care of personal grooming?: A Little Help from another person toileting, which includes using toliet, bedpan, or urinal?: A Little Help from another person bathing (including washing, rinsing, drying)?: A Little Help from another person to put on and taking off regular upper body clothing?: A Little Help from another person to put on and taking off regular lower body clothing?: A Little 6 Click Score: 19   End of Session Equipment Utilized During Treatment: Gait belt;Rolling walker Nurse Communication: Mobility status  Activity Tolerance: Patient tolerated treatment well Patient left: in bed;with call bell/phone within reach;with bed alarm set  OT Visit Diagnosis: Unsteadiness on feet (R26.81);Muscle weakness (generalized) (M62.81)                Time:  [id number] OT Time Calculation (min): 47 min Charges:  OT General Charges $OT Visit: 1 Visit OT Evaluation $OT Eval Moderate Complexity: 1 Mod OT Treatments $Self Care/Home Management : 23-37 mins  Ebony Hail Harold Hedge) Marsa Aris OTR/L Acute Rehabilitation Services Pager: [phone] Office: [place] 12/12/2018, 5:02 PM

## 2018-12-12 NOTE — Procedures (Signed)
Patient Name: Mindy Young  MRN: [id number]  Epilepsy Attending: Lora Havens  Referring Physician/Provider: Dr Kerney Elbe Duration: 12/11/2018 1534 to 12/12/2018 1534  Patient history:  73yo F with episode of speech disturbance. EEG to evaluate for seizure.  Level of alertness: awake, asleep  AEDs during EEG study: None  Technical aspects: This EEG study was done with scalp electrodes positioned according to the 10-20 International system of electrode placement. Electrical activity was acquired at a sampling rate of 500Hz  and reviewed with a high frequency filter of 70Hz  and a low frequency filter of 1Hz . EEG data were recorded continuously and digitally stored.   DESCRIPTION: During awake state, the posterior dominant rhythm consists of 9 Hz activity of moderate voltage (25-35 uV) seen predominantly in posterior head regions, symmetric and reactive to eye opening and eye closing. Sleep was characterized by sleep spindles (12 to 14 Hz), vertex waves, maximum frontocentral  Event button was pressed on 12/11/2018 at 1546, 1635, 1849 and 1921. During all these events, patient was noted to have word finding difficulties.  The episode lasted about 4 minutes on average.  Concomitant EEG showed rhythmic delta activity in left temporal region which gradually evolved into to theta slowing and spread to involve all of left hemisphere.  Few  such events were also seen during when patient was not tested during day. No events were seen during night.   Hyperventilation and photic stimulation were not performed.  Abnormality -Seizure, left temporal  IMPRESSION: This study showed seizures arising from left temporal region during which patient had double finding her words, speaking, lasting about 4 minutes on average.  Concomitant EEG showed rhythmic delta activity in the left temporal region which gradually evolved into to theta slowing and spread to involve all of left hemisphere.   EEG  appears improved from prior study as number of seizures has reduced.   Zeeshan Korte [name]

## 2018-12-12 NOTE — Progress Notes (Signed)
Reason for consult: seizure  Subjective: States she slept better last night and feels good today. Had few more episodes yesterday evening but none since. No new concerns.   ROS: negative except above  Examination  Vital signs in last 24 hours: Temp:  [97.2 F (36.2 C)-98.5 F (36.9 C)] 97.2 F (36.2 C) (10/21 0901) Pulse Rate:  [81-92] 87 (10/21 0901) Resp:  [16-18] 16 (10/21 0901) BP: (122-139)/(75-95) 122/75 (10/21 0901) SpO2:  [97 %-100 %] 97 % (10/21 0901)  General: lying in bed, not in apparent distress  CVS: pulse-normal rate and rhythm RS: breathing comfortably Extremities: normal   Neuro: MS: Alert, oriented, follows commands.   CN: pupils equal and reactive,  EOMI, face symmetric, tongue midline, normal sensation over face, Motor: 4/5 strength in all 4 extremities Reflexes: 2+ bilaterally over patella, biceps, plantars: flexor Coordination: normal Gait: not tested   Basic Metabolic Panel: Recent Labs  Lab 12/10/18 0812  12/11/18 0239 12/11/18 0939 12/11/18 1400 12/11/18 05/18/2055 12/12/18 0614  NA 126*   < > 127* 125* 126* 125* 128*  K 3.6   < > 4.3 3.4* 3.3* 3.5 3.7  CL 95*   < > 92* 90* 90* 91* 94*  CO2 22   < > 25 23 23 22 23   GLUCOSE 108*   < > 95 107* 142* 99 100*  BUN 6*   < > 8 <5* 9 7* 7*  CREATININE 0.54   < > 0.58 0.57 0.48 0.53 0.53  CALCIUM 8.5*   < > 8.9 8.9 8.9 8.9 8.9  MG 1.7  --   --   --   --   --   --   PHOS 3.8  --   --   --   --   --   --    < > = values in this interval not displayed.    CBC: Recent Labs  Lab 12/09/18 1910 12/11/18 0239 12/12/18 0614  WBC 12.7* 9.2 9.7  NEUTROABS 9.0*  --   --   HGB 13.1 12.0 11.7*  HCT 37.4 32.5* 33.7*  MCV 94.7 93.7 95.2  PLT 553* 484* 490*     Coagulation Studies: Recent Labs    12/09/18 May 18, 2211  LABPROT 13.4  INR 1.0    Imaging MRI brain without contrast 10/19/ongoing: No acute abnormality.   ASSESSMENT AND PLAN: 74 year old female with hyponatremia who presented with new  onset speech disturbance and word finding difficulty.  Concomitant EEG showed rhythmic left posterior temporal slowing consistent with seizures.    Speech disturbance New onset seizures Hyponatremia -As patient has not had seizures before and her MRI brain did not show any acute abnormality, it is possible that these are provoked seizures in the setting of hyponatremia -However patient is having very frequent seizures and therefore we will start treating them with antiepileptics at this point.  Recommendations - Continue Keppra 1000mg  BID for maintenance. Continue pyridoxine 100mg  while on keppra. -We will continue LTM EEG to monitor for subclinical seizures -Most likely patient will be discharged on at least 1 antiseizure medication.  However once hyponatremia is corrected if patient does not have any further seizures, antiseizure medication can possibly be weaned off as an outpatient. -Seizure precautions -As needed IV Ativan for generalized tonic-clonic seizure lasting more than 2 minutes, focal seizure lasting more than 5 minutes   Thank you for allowing [place] to participate in the care of this patient.  Please page neuro hospitalist after 5 PM for any questions.

## 2018-12-12 NOTE — Progress Notes (Signed)
Physical Therapy Treatment Patient Details Name: Mindy Young MRN: [id number] DOB: 01-24-1946 Today's Date: 12/12/2018    History of Present Illness Mindy Young is a 73 y.o. female with medical history significant of hypertension, GERD, depression, anxiety, tardive dyskinesia, C1 cervical spine fracture, breast cancer (right lumpectomy), alcohol and tobacco abuse, hyponatremia, recent pubic bone fracture, who presents with difficulty speaking and seizure-like activity.    PT Comments    Pt OOB mobility limited by continuous EEG. Pt given HEP, dtr present and agreeable to assist pt with HEP. Pt continues to have c/o of R LE and groin pain with mobility. Acute PT to cont to follow.   Follow Up Recommendations  Home health PT;Supervision/Assistance - 24 hour     Equipment Recommendations  None recommended by PT    Recommendations for Other Services       Precautions / Restrictions Precautions Precautions: Fall Precaution Comments: continuous EEG Restrictions Weight Bearing Restrictions: No    Mobility  Bed Mobility Overal bed mobility: Needs Assistance Bed Mobility: Supine to Sit     Supine to sit: Min assist     General bed mobility comments: minA for R LE management off EOB due to onset of pain, HOB elevated, increased time, use of bed rail  Transfers Overall transfer level: Needs assistance Equipment used: Rolling walker (2 wheeled) Transfers: Sit to/from [facility] Sit to Stand: Min guard Stand pivot transfers: Min guard       General transfer comment: verbal cues for hand placement, min guard for balance, verbal cues for walker sequencing, decreased R LE WBing  Ambulation/Gait             General Gait Details: unable due to cont EEG   Stairs             Wheelchair Mobility    Modified Rankin (Stroke Patients Only)       Balance Overall balance assessment: Needs assistance;History of Falls Sitting-balance support: Feet  supported;No upper extremity supported Sitting balance-Leahy Scale: Fair     Standing balance support: During functional activity;Bilateral upper extremity supported Standing balance-Leahy Scale: Poor Standing balance comment: reliant on UEs                            Cognition Arousal/Alertness: Awake/alert Behavior During Therapy: WFL for tasks assessed/performed Overall Cognitive Status: Within Functional Limits for tasks assessed                                 General Comments: pt remains with tangential speech but is alert and oriented and able to repeat MD reports and events appropriately      Exercises General Exercises - Lower Extremity Ankle Circles/Pumps: AROM;Both;10 reps Gluteal Sets: AROM;Both;10 reps;Seated Long Arc Quad: Right;10 reps;Seated    General Comments General comments (skin integrity, edema, etc.): VSS      Pertinent Vitals/Pain Pain Assessment: Faces Faces Pain Scale: Hurts little more Pain Location: R LE during std pvt to chair Pain Descriptors / Indicators: Grimacing Pain Intervention(s): Monitored during session    Home Living                      Prior Function            PT Goals (current goals can now be found in the care plan section) Progress towards PT goals: Not progressing toward goals -  comment(due to continuous EEG)    Frequency    Min 4X/week      PT Plan Current plan remains appropriate    Co-evaluation              AM-PAC PT "6 Clicks" Mobility   Outcome Measure  Help needed turning from your back to your side while in a flat bed without using bedrails?: A Little Help needed moving from lying on your back to sitting on the side of a flat bed without using bedrails?: A Little Help needed moving to and from a bed to a chair (including a wheelchair)?: A Little Help needed standing up from a chair using your arms (e.g., wheelchair or bedside chair)?: A Little Help needed to  walk in hospital room?: A Little Help needed climbing 3-5 steps with a railing? : A Little 6 Click Score: 18    End of Session Equipment Utilized During Treatment: Gait belt Activity Tolerance: Patient tolerated treatment well Patient left: in chair;with call bell/phone within reach;with family/visitor present Nurse Communication: Mobility status PT Visit Diagnosis: Difficulty in walking, not elsewhere classified (R26.2)     Time: 1150-1210 PT Time Calculation (min) (ACUTE ONLY): 20 min  Charges:  $Therapeutic Activity: 8-22 mins                     Kittie Plater, PT, DPT Acute Rehabilitation Services Pager #: [phone] Office #: [phone]    Berline Lopes 12/12/2018, 1:08 PM

## 2018-12-13 ENCOUNTER — Telehealth: Payer: Self-pay | Admitting: Neurology

## 2018-12-13 LAB — BASIC METABOLIC PANEL
Anion gap: 11 (ref 5–15)
BUN: 12 mg/dL (ref 8–23)
CO2: 24 mmol/L (ref 22–32)
Calcium: 8.9 mg/dL (ref 8.9–10.3)
Chloride: 95 mmol/L — ABNORMAL LOW (ref 98–111)
Creatinine, Ser: 0.64 mg/dL (ref 0.44–1.00)
GFR calc Af Amer: >60 mL/min (ref >60)
GFR calc non Af Amer: >60 mL/min (ref >60)
Glucose, Bld: 109 mg/dL — ABNORMAL HIGH (ref 70–99)
Potassium: 3.8 mmol/L (ref 3.5–5.1)
Sodium: 130 mmol/L — ABNORMAL LOW (ref 135–145)

## 2018-12-13 MED ORDER — LEVETIRACETAM 1000 MG PO TABS: 1000.0000 mg | ORAL_TABLET | Freq: Two times a day (BID) | ORAL | 2 refills | Status: DC

## 2018-12-13 MED ORDER — PYRIDOXINE HCL 100 MG PO TABS: 100.0000 mg | ORAL_TABLET | Freq: Every day | ORAL | 2 refills | Status: DC

## 2018-12-13 MED FILL — levETIRAcetam 500 MG TABS: 500 | 30 days supply | Qty: 120 | Fill #0

## 2018-12-13 MED FILL — VITAMIN B6 50 MG TABS: 50 | 30 days supply | Qty: 60 | Fill #0

## 2018-12-13 NOTE — Care Management (Signed)
Patient suffers from seizures and pelvic fracture which impairs their ability to perform daily activities like bathing, dressing, grooming and toileting in the home. A walker will not resolve issue with performing activities of daily living. A wheelchair will allow patient to safely perform daily activities. Patient can safely propel the wheelchair in the home or has a caregiver who can provide assistance. Length of need 6 months .  Accessories: elevating leg rests (ELRs), wheel locks, extensions and anti-tippers.

## 2018-12-13 NOTE — Progress Notes (Signed)
LTM EEG discontinued - no skin breakdown at unhook.   

## 2018-12-13 NOTE — Telephone Encounter (Signed)
Patient is scheduled for 01/21/19 at 9:15AM. Ref is in your blue book. Thanks!

## 2018-12-13 NOTE — Telephone Encounter (Signed)
Patient called and said she has been hospitalized due to a gram mal seizure. She is being discharged today. Patient stated she'd like to see Dr. Carles Collet to follow up. However, she's not seen Dr. Carles Collet for seizures before.   Routing to clinical staff for review prior to scheduling due to new problem.

## 2018-12-13 NOTE — Telephone Encounter (Signed)
Please advise I am new to your scheduling

## 2018-12-13 NOTE — Progress Notes (Signed)
Patient discharged home, daughter providing transportation. Discharge packet discussed with patient, questions answered. Belongings returned to patient.  Mindy Young

## 2018-12-13 NOTE — Discharge Instructions (Signed)
Per [facility] statutes, patients with seizures are not allowed to drive until they have been seizure-free for six months. Use caution when using heavy equipment or power tools. Avoid working on ladders or at heights. Take showers instead of baths. Ensure the water temperature is not too high on the home water heater. Do not go swimming alone. When caring for infants or small children, sit down when holding, feeding, or changing them to minimize risk of injury to the child in the event you have a seizure. Also, Maintain good sleep hygiene. Avoid alcohol.  If patient has another seizure, call 911 and bring them back to the ED if: A.  The seizure lasts longer than 5 minutes.      B.  The patient doesn't wake shortly after the seizure or has new problems such as difficulty seeing, speaking or moving following the seizure C.  The patient was injured during the seizure D.  The patient has a temperature over 102 F (39C) E.  The patient vomited during the seizure and now is having trouble breathing

## 2018-12-13 NOTE — TOC Transition Note (Addendum)
Transition of Care [facility]) - CM/SW Discharge Note   Patient Details  Name: Mindy Young MRN: [id number] Date of Birth: 10/12/1945  [facility] CM/SW Contact:  Pollie Friar, RN Phone Number: 12/13/2018, 2:01 PM   Clinical Narrative:    Pt discharging home with resumption of [place] services through [place]. Cory with [facility] aware of d/c and resumption orders. Pt requesting a wheelchair for assistance with out of the home appts . [facility] to deliver to the room. CM provided her resources for alcohol abuse for inpatient/ outpatient/ on line counseling.  Pt has transportation home.    Final next level of care: Home w Home Health Services Barriers to Discharge: No Barriers Identified   Patient Goals and CMS Choice   CMS Medicare.gov Compare Post Acute Care list provided to:: Patient Choice offered to / list presented to : Patient  Discharge Placement                       Discharge Plan and Services   Discharge Planning Services: CM Consult Post Acute Care Choice: Home Health          DME Arranged: Wheelchair manual DME Agency: [facility]       HH Arranged: PT, OT, RN [place] Agency: [place] Date [place]: 12/13/18   Representative spoke with at [place]: [place] ([place]) Interventions     Readmission Risk Interventions No flowsheet data found.

## 2018-12-13 NOTE — Telephone Encounter (Signed)
Happy to see her for that.  Put her in NP slot

## 2018-12-13 NOTE — Discharge Summary (Signed)
Physician Discharge Summary  Mindy Young [id number] DOB: 1945/11/19 DOA: 12/09/2018  PCP: Lennie Odor, PA-C  Admit date: 12/09/2018 Discharge date: 12/13/2018  Admitted From: Home Disposition:  Home with home health   Recommendations for Outpatient Follow-up:  1. Follow-up with PCP in 1 week 2. Follow-up with neurology in 6 to 8 weeks. Referral sent to Dr. Brett Fairy at [facility]  3. Follow-up with orthopedic surgery Dr. Durward Fortes  4. Follow-up BMP in 1 week 5. Follow-up with nephrology as an outpatient 6. Follow-up with outpatient psychiatry 7. Seizure precaution provided in AVS   Discharge Condition: Stable CODE STATUS: Partial code, DO NOT INTUBATE Diet recommendation: Fluid restricted to 2 L daily  Brief/Interim Summary: Mindy Young is a 73 year old female with past medical history of hypertension, anxiety/depression, breast cancer, alcohol abuse, tobacco use, C1 cervical spine fracture, chronic hyponatremia, recent pubic bone fracture, chronic pain on narcotics and muscle relaxers as well as Xanax, tardive dyskinesia who was brought to the ED on 10/18 who presented with difficulty speaking and seizure-like activity. She was seen having difficulty speaking and stuttering about 11:30 AM prior to admission and has had 3 additional similar prior episodes. Patient had a unilateral left-sided gaze not responding while her eyes were open but denied unilateral weakness or numbness in the extremities. In the ED was found to have WBC 12.7, negative UA, negative EtOH, sodium 121, CT head and neck with questionable right M2 stenosis versus occlusion versus tortuous vessel artifact, CT brain perfusion negative. Neurology was consulted. Patient had EEG which was negative. MRI brain without acute findings. Nephrology was consulted for acute on chronic hyponatremia with seizure-like activity thought to be symptomatic from hyponatremia.Patient underwent continuous EEG  and had confirmed seizures.  Patient has been on Keppra as well as pyridoxine.  Patient's hyponatremia improved with fluid restriction.  She remained seizure-free for over 24 hours and was discharged home with home health therapy.  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 -Keppra 1000 mg twice daily, pyridoxine 100 mg daily while on keppra  -Follow-up with neurology in 6 to 8 weeks  Hyponatremia, multifactorial -Nephrology consulted, signed off 10/21 -Thought to be secondary to polydipsia and possible tea and toast phenomenon and questionable SIADH per nephrology, initially had a correction and then rebounded once patient was alert and drinking more water. -Per nephrology: Hold Effexor,2 L fluid restriction, hold normal saline continue to trend sodium -Sodium goal of 130 -Repeat BMP as an outpatient in 1 week  Alcohol use in setting of depression -Has had an increase in alcohol intake over the past year with several stressors including passing of her father from covid, daughter moving away  -Counseled on alcohol cessation  Tobacco use -Counseled on cessation -Nicotine patch  Depression -Holding Effexor as above -Follow-up with outpatient psych  Hypertension -Continue home amlodipine, clonidine, metoprolol  Right breast cancer, ER positive -Continue letrozole  GERD -Protonix  Anxiety -On alprazolam 0.5 mg twice daily as needed  Previously diagnosed fracture parasymphyseal pubic bone, right secondary to mechanical fall on 11/24/2018 -Determined to be nonoperative management at that  time.  Follow-up with orthopedic surgery Dr.  Whitfield as an outpatient -Home health physical therapy   Discharge Instructions  Discharge Instructions    Ambulatory referral to Neurology   Complete by: As directed    An appointment is requested in approximately: 6 weeks with Dr. Brett Fairy   Call MD for:  difficulty breathing, headache or visual disturbances   Complete by: As directed    Call MD for:  extreme fatigue   Complete by: As directed    Call MD for:  persistant dizziness or light-headedness   Complete by: As directed    Call MD for:  persistant nausea and vomiting   Complete by: As directed    Call MD for:  severe uncontrolled pain   Complete by: As directed    Call MD for:  temperature >100.4   Complete by: As directed    Diet general   Complete by: As directed    Fluid restrict to 2L daily   Discharge instructions   Complete by: As directed    Recommend alcohol and tobacco cessation.  Recommend restricting fluid to 2L total daily.   Discharge instructions   Complete by: As directed    You were cared for by a hospitalist during your hospital stay. If you have any questions about your discharge medications or the care you received while you were in the hospital after you are discharged, you can call the unit and ask to speak with the hospitalist on call if the hospitalist that took care of you is not available. Once you are discharged, your primary care physician will handle any further medical issues. Please note that NO REFILLS for any discharge medications will be authorized once you are discharged, as it is imperative that you return to your primary care physician (or establish a relationship with a primary care physician if you do not have one) for your aftercare needs so that they can reassess your need for medications and monitor your lab values.   Discharge instructions   Complete by: As directed    Per [facility] statutes, patients with seizures are not  allowed to drive untilthey have been seizure-free for six months. Use caution when using heavy equipment or power tools. Avoid working on ladders or at heights. Take showers instead of baths. Ensure the water temperature is not too high on the home water heater. Do not go swimming alone. When caring for infants or small children, sit down when holding, feeding, or changing them to minimize risk of injury to the child in the event you have a seizure. Also, Maintain good sleep hygiene. Avoid alcohol.  If patienthas another seizure, call 911 and bring them back to the ED if: A. The seizure lasts longer than 5 minutes.  B. The patient doesn't wake shortly after the seizure or has new problems such as difficulty seeing, speaking or moving following the seizure C. The patient was injured during the seizure D. The patient has a temperature over 102 F (39C) E. The patient vomited during the seizure and now is having trouble breathing    Increase activity slowly   Complete by: As directed      Allergies as of 12/13/2018      Reactions   Percocet [oxycodone-acetaminophen] Other (See Comments)   "bugs crawling on me"      Medication List    STOP taking these medications   desvenlafaxine 50 MG 24 hr tablet Commonly known as: PRISTIQ   methocarbamol 500 MG tablet Commonly known as: ROBAXIN  TAKE these medications   ALPRAZolam 0.5 MG tablet Commonly known as: XANAX Take 0.5 mg by mouth 2 (two) times daily as needed for anxiety.   amLODipine 5 MG tablet Commonly known as: NORVASC Take 5 mg by mouth daily.   cetirizine 10 MG tablet Commonly known as: ZYRTEC Take 10 mg by mouth as needed for allergies.   cloNIDine 0.1 MG tablet Commonly known as: CATAPRES Take 1 tablet by mouth 2 (two) times daily.   HYDROcodone-acetaminophen 5-325 MG tablet Commonly known as: NORCO/VICODIN Take 1 tablet by mouth every 6 (six) hours as needed for moderate pain. What changed: Another  medication with the same name was removed. Continue taking this medication, and follow the directions you see here.   letrozole 2.5 MG tablet Commonly known as: Dresser 1 TABLET(2.5 MG) BY MOUTH DAILY What changed: See the new instructions.   levETIRAcetam 1000 MG tablet Commonly known as: KEPPRA Take 1 tablet (1,000 mg total) by mouth 2 (two) times daily.   methylphenidate 20 MG tablet Commonly known as: RITALIN Take 20 mg by mouth daily.   metoprolol succinate 100 MG 24 hr tablet Commonly known as: TOPROL-XL Take 100 mg by mouth daily.   omeprazole 40 MG capsule Commonly known as: PRILOSEC TAKE 1 CAPSULE BY MOUTH TWICE DAILY 30 MINUTES BEFORE BREAKFAST AND 30 MINUTES BEFORE DINNER What changed: See the new instructions.   oxybutynin 10 MG 24 hr tablet Commonly known as: DITROPAN-XL Take 10 mg by mouth daily.   polyethylene glycol 17 g packet Commonly known as: MIRALAX / GLYCOLAX Take 17 g by mouth daily as needed for moderate constipation.   promethazine 25 MG tablet Commonly known as: PHENERGAN Take 25 mg by mouth every 6 (six) hours as needed for nausea or vomiting.   pyridoxine 100 MG tablet Commonly known as: B-6 Take 1 tablet (100 mg total) by mouth daily. Start taking on: December 14, 2018      Follow-up Information    [place], [place], [place]. Schedule an appointment as soon as possible for a visit in 1 week(s).   Specialty: Nurse Practitioner Why: Repeat BMP in 1 week to recheck sodium level  Contact information: [address] [place] [address] [phone]        Garald Balding, MD. Call.   Specialty: Orthopedic Surgery Why: Reschedule your appointment as soon as able  Contact information: [place] [address] [phone]        [facility]. Schedule an appointment as soon as possible for a visit in 6 week(s).   Why: Referral sent at time of discharge. Follow up with neurology  in 6-8 weeks.  Contact information: [address]     [place] [phone]       [place], [place]. Schedule an appointment as soon as possible for a visit.   Why: Call to follow up if continued issues with hyponatremia  Contact information: [address] [place] [address] [phone]          Allergies  Allergen Reactions  . Percocet [Oxycodone-Acetaminophen] Other (See Comments)    "bugs crawling on me"    Consultations:  Neurology  Nephrology   Procedures/Studies: Ct Angio Head W Or Wo Contrast  Result Date: 12/09/2018 CLINICAL DATA:  Initial evaluation for acute speech difficulty. Left-sided weakness. EXAM: CT ANGIOGRAPHY HEAD AND NECK TECHNIQUE: Multidetector CT imaging of the head and neck was performed using the standard protocol during bolus administration  of intravenous contrast. Multiplanar CT image reconstructions and MIPs were obtained to evaluate the vascular anatomy. Carotid stenosis measurements (when applicable) are obtained utilizing NASCET criteria, using the distal internal carotid diameter as the denominator. CONTRAST:  40mL OMNIPAQUE IOHEXOL 350 MG/ML SOLN COMPARISON:  Prior head CT from earlier the same day. FINDINGS: CT HEAD FINDINGS Brain: Examination somewhat degraded by motion artifact. Age-related cerebral atrophy with moderate chronic microvascular ischemic disease. No acute intracranial hemorrhage. No acute large vessel territory infarct. No mass lesion, midline shift or mass effect. No hydrocephalus. No extra-axial fluid collection. Vascular: No hyperdense vessel. Scattered vascular calcifications noted within the carotid siphons. Skull: Scalp soft tissues and calvarium within normal limits. Sinuses: Chronic left sphenoid sinusitis noted. Visualized paranasal sinuses are otherwise clear. No mastoid effusion. Orbits: Globes and orbital soft tissues demonstrate no acute finding. Review of the MIP images confirms  the above findings CTA NECK FINDINGS Aortic arch: Visualized aortic arch normal in caliber with normal branch pattern. Moderate atherosclerotic change about the aortic arch and origin of the great vessels without hemodynamically significant stenosis. Visualized subclavian arteries widely patent. Right carotid system: Right CCA tortuous proximally but widely patent to the bifurcation without stenosis. No significant atheromatous narrowing about the right bifurcation. Focal pseudoaneurysm extending from the medial aspect of the mid-distal right ICA measures 7 x 4 x 6 mm (series 11, image 200). No significant intraluminal thrombus or intimal irregularity. Right ICA otherwise widely patent without stenosis or other acute abnormality. Left carotid system: Left CCA tortuous proximally but widely patent to the bifurcation without stenosis. No significant atheromatous narrowing about the left bifurcation. Left ICA widely patent distally to the skull base without stenosis, dissection, or occlusion. Vertebral arteries: Both vertebral arteries arise from the subclavian arteries. Mixed plaque at the origin of the left vertebral artery with associated stenosis of up to approximately 50%. Additional moderate multifocal areas of narrowing within the right V2 segment at the levels of C5 and C3, and on the left at C1 related to intrinsic compression from uncovertebral disease. Vertebral arteries otherwise patent without stenosis or vascular occlusion. Skeleton: No acute osseous abnormality. No discrete lytic or blastic osseous lesions. Moderate multilevel cervical spondylosis at C4-5 through C6-7, with ankylosis of the C4 and C5 vertebral bodies. Prominent degenerative changes about the C1-2 articulation. Other neck: No other acute soft tissue abnormality within the neck. No mass lesion or adenopathy. Subcentimeter hypodense nodule within the inferior right thyroid lobe noted, of doubtful significance given size. Upper chest:  Moderate to advanced upper lobe predominant emphysema. Visualized upper chest demonstrates no acute finding. Review of the MIP images confirms the above findings CTA HEAD FINDINGS Anterior circulation: Petrous segments patent bilaterally. Scattered atheromatous plaque within the cavernous/supraclinoid ICAs with secondary mild to moderate multifocal narrowing. No hemodynamically significant stenosis. ICA termini well perfused. A1 segments patent bilaterally. Right A1 hypoplastic, accounting for the slightly diminutive right ICA is compared to the left. Normal anterior communicating artery complex. Anterior cerebral arteries patent to their distal aspects without stenosis. Right M1 widely patent. Normal right MCA bifurcation. On coronal sequence, there appears to be focal occlusion of a proximal right M2 or possibly M3 branch at the base of the right sylvian fissure (series 12, image 88). a this is difficult to see on corresponding axial and sagittal sequence. Distal right MCA branches otherwise well perfused. Left M1 segment widely patent. Normal left MCA bifurcation. Distal left MCA branches well perfused and fairly symmetric with the right. Posterior circulation: Vertebral arteries widely  patent to the vertebrobasilar junction without stenosis. Both PICA is patent bilaterally. 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 Basilar widely patent to its distal aspect. Superior cerebral arteries patent bilaterally. Both PCAs primarily supplied via the basilar and are well perfused to their distal aspects without stenosis. Venous sinuses: Grossly patent allowing for timing the contrast bolus. Poor filling of the left sigmoid sinus and proximal internal jugular vein felt to be related to timing of the contrast bolus. No hyperdensity seen within this region on corresponding noncontrast head CT. Anatomic variants: Hypoplastic right A1 segment. No intracranial aneurysm. Review of the MIP images confirms the above  findings IMPRESSION: 1. Focal occlusion of a proximal right M2/M3 branch at the base of the right sylvian fissure as detailed above, concerning for LVO. 2. 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 No significant intraluminal thrombus or intimal irregularity identified. 3. Mild to moderate atherosclerotic change about the aortic arch and carotid siphons without hemodynamically significant stenosis. 4.  Emphysema (ICD10-J43.9). Critical Value/emergent results were called by telephone at the time of interpretation on 12/09/2018 at 10:20 pm to providerDr. Rory Percy, Who verbally acknowledged these results. Electronically Signed   By: Jeannine Boga M.D.   On: 12/09/2018 22:38   Ct Head Wo Contrast  Result Date: 12/09/2018 CLINICAL DATA:  73 year old female with history of speech difficulty for the past day. EXAM: CT HEAD WITHOUT CONTRAST TECHNIQUE: Contiguous axial images were obtained from the base of the skull through the vertex without intravenous contrast. COMPARISON:  Head CT 11/12/2017. FINDINGS: Brain: In the inferior aspects of the frontal lobes of the brain bilaterally (right greater than left) there are poorly find areas of low attenuation and disruption of normal gray-white differentiation, which appear slightly more conspicuous than prior study 11/11/2017. Patchy and confluent areas of decreased attenuation are noted throughout the deep and periventricular white matter of the cerebral hemispheres bilaterally, compatible with chronic microvascular ischemic disease. No evidence of acute infarction, hemorrhage, hydrocephalus, extra-axial collection or mass lesion/mass effect. Vascular: No hyperdense vessel or unexpected calcification. Skull: Normal. Negative for fracture or focal lesion. Sinuses/Orbits: No acute finding. Other: None. IMPRESSION: 1. Ill-defined areas of low attenuation in the frontal lobes bilaterally  (right greater than left) with loss of gray-white differentiation, increased compared to prior study 11/11/2017. Areas of age-indeterminate ischemia in these regions are not excluded. If there is clinical concern for acute ischemia, further evaluation with brain MRI could be considered. 2. Extensive chronic microvascular ischemic changes throughout the cerebral white matter redemonstrated. Critical Value/emergent results were called by telephone at the time of interpretation on 12/09/2018 at 8:06 pm to [place] , who verbally acknowledged these results. Electronically Signed   By: Vinnie Langton M.D.   On: 12/09/2018 20:06   Ct Angio Neck W And/or Wo Contrast  Result Date: 12/09/2018 CLINICAL DATA:  Initial evaluation for acute speech difficulty. Left-sided weakness. EXAM: CT ANGIOGRAPHY HEAD AND NECK TECHNIQUE: Multidetector CT imaging of the head and neck was performed using the standard protocol during bolus administration of intravenous contrast. Multiplanar CT image reconstructions and MIPs were obtained to evaluate the vascular anatomy. Carotid stenosis measurements (when applicable) are obtained utilizing NASCET criteria, using the distal internal carotid diameter as the denominator. CONTRAST:  65mL OMNIPAQUE IOHEXOL 350 MG/ML SOLN COMPARISON:  Prior head CT from earlier the same day. FINDINGS: CT HEAD FINDINGS Brain: Examination somewhat degraded by motion artifact. Age-related cerebral atrophy  with moderate chronic microvascular ischemic disease. No acute intracranial hemorrhage. No acute large vessel territory infarct. No mass lesion, midline shift or mass effect. No hydrocephalus. No extra-axial fluid collection. Vascular: No hyperdense vessel. Scattered vascular calcifications noted within the carotid siphons. Skull: Scalp soft tissues and calvarium within normal limits. Sinuses: Chronic left sphenoid sinusitis noted. Visualized paranasal sinuses are otherwise clear. No mastoid  effusion. Orbits: Globes and orbital soft tissues demonstrate no acute finding. Review of the MIP images confirms the above findings CTA NECK FINDINGS Aortic arch: Visualized aortic arch normal in caliber with normal branch pattern. Moderate atherosclerotic change about the aortic arch and origin of the great vessels without hemodynamically significant stenosis. Visualized subclavian arteries widely patent. Right carotid system: Right CCA tortuous proximally but widely patent to the bifurcation without stenosis. No significant atheromatous narrowing about the right bifurcation. Focal pseudoaneurysm extending from the medial aspect of the mid-distal right ICA measures 7 x 4 x 6 mm (series 11, image 200). No significant intraluminal thrombus or intimal irregularity. Right ICA otherwise widely patent without stenosis or other acute abnormality. Left carotid system: Left CCA tortuous proximally but widely patent to the bifurcation without stenosis. No significant atheromatous narrowing about the left bifurcation. Left ICA widely patent distally to the skull base without stenosis, dissection, or occlusion. Vertebral arteries: Both vertebral arteries arise from the subclavian arteries. Mixed plaque at the origin of the left vertebral artery with associated stenosis of up to approximately 50%. Additional moderate multifocal areas of narrowing within the right V2 segment at the levels of C5 and C3, and on the left at C1 related to intrinsic compression from uncovertebral disease. Vertebral arteries otherwise patent without stenosis or vascular occlusion. Skeleton: No acute osseous abnormality. No discrete lytic or blastic osseous lesions. Moderate multilevel cervical spondylosis at C4-5 through C6-7, with ankylosis of the C4 and C5 vertebral bodies. Prominent degenerative changes about the C1-2 articulation. Other neck: No other acute soft tissue abnormality within the neck. No mass lesion or adenopathy. Subcentimeter  hypodense nodule within the inferior right thyroid lobe noted, of doubtful significance given size. Upper chest: Moderate to advanced upper lobe predominant emphysema. Visualized upper chest demonstrates no acute finding. Review of the MIP images confirms the above findings CTA HEAD FINDINGS Anterior circulation: Petrous segments patent bilaterally. Scattered atheromatous plaque within the cavernous/supraclinoid ICAs with secondary mild to moderate multifocal narrowing. No hemodynamically significant stenosis. ICA termini well perfused. A1 segments patent bilaterally. Right A1 hypoplastic, accounting for the slightly diminutive right ICA is compared to the left. Normal anterior communicating artery complex. Anterior cerebral arteries patent to their distal aspects without stenosis. Right M1 widely patent. Normal right MCA bifurcation. On coronal sequence, there appears to be focal occlusion of a proximal right M2 or possibly M3 branch at the base of the right sylvian fissure (series 12, image 88). a this is difficult to see on corresponding axial and sagittal sequence. Distal right MCA branches otherwise well perfused. Left M1 segment widely patent. Normal left MCA bifurcation. Distal left MCA branches well perfused and fairly symmetric with the right. Posterior circulation: Vertebral arteries widely patent to the vertebrobasilar junction without stenosis. Both PICA is patent bilaterally. 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 Basilar widely patent to its distal aspect. Superior cerebral arteries patent bilaterally. Both PCAs primarily supplied via the basilar and are well perfused to their distal aspects without stenosis. Venous sinuses: Grossly patent allowing for timing the contrast bolus. Poor filling of the left sigmoid sinus and  proximal internal jugular vein felt to be related to timing of the contrast bolus. No hyperdensity seen within this region on corresponding noncontrast head CT.  Anatomic variants: Hypoplastic right A1 segment. No intracranial aneurysm. Review of the MIP images confirms the above findings IMPRESSION: 1. Focal occlusion of a proximal right M2/M3 branch at the base of the right sylvian fissure as detailed above, concerning for LVO. 2. 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 No significant intraluminal thrombus or intimal irregularity identified. 3. Mild to moderate atherosclerotic change about the aortic arch and carotid siphons without hemodynamically significant stenosis. 4.  Emphysema (ICD10-J43.9). Critical Value/emergent results were called by telephone at the time of interpretation on 12/09/2018 at 10:20 pm to providerDr. Rory Percy, Who verbally acknowledged these results. Electronically Signed   By: Jeannine Boga M.D.   On: 12/09/2018 22:38   Mr Brain Wo Contrast  Result Date: 12/10/2018 CLINICAL DATA:  Speech difficulty. EXAM: MRI HEAD WITHOUT CONTRAST TECHNIQUE: Multiplanar, multiecho pulse sequences of the brain and surrounding structures were obtained without intravenous contrast. COMPARISON:  CT and CTA from yesterday FINDINGS: Brain: No acute infarction, hemorrhage, hydrocephalus, extra-axial collection or mass lesion. 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 Chronic small vessel ischemic gliosis has become more confluent in the deep cerebral white matter. Stable age normal brain volume. Vascular: Preserved flow voids.  There was CTA yesterday Skull and upper cervical spine: Normal marrow signal. Cervical facet spurring with C3-4 anterolisthesis. Sinuses/Orbits: Bilateral cataract resection Other: Overall mild intermittent motion degradation IMPRESSION: 1. No acute finding. 2. Moderate chronic small vessel ischemia, including remote small vessel infarcts, that has progressed from  2013. Electronically Signed   By: Monte Fantasia M.D.   On: 12/10/2018 04:18   Ct Cerebral Perfusion W Contrast  Result Date: 12/09/2018 CLINICAL DATA:  Initial evaluation for acute aphasia, left-sided weakness. EXAM: CT PERFUSION BRAIN TECHNIQUE: Multiphase CT imaging of the brain was performed following IV bolus contrast injection. Subsequent parametric perfusion maps were calculated using RAPID software. CONTRAST:  12mL OMNIPAQUE IOHEXOL 350 MG/ML SOLN COMPARISON:  Prior CT and CTA from earlier same day. FINDINGS: CT Brain Perfusion Findings: CBF (<30%) Volume: 5mL Perfusion (Tmax>6.0s) volume: 59mL Mismatch Volume: 46mL ASPECTS on noncontrast CT Head: No aspects score provided. Infarct Core: 0 mL Infarction Location:Negative CT perfusion for acute core infarct or perfusion deficit. Upon further review of prior CTA, previously noted possible right M2/M3 occlusion felt to most likely reflect focal vascular tortuosity with associated stenosis. Attenuated but patent flow is seen distally. IMPRESSION: 1. Negative CT perfusion for acute core infarct or perfusion abnormality. 2. Upon further consideration and in conjunction with these perfusion findings, previously questioned right M2/M3 occlusion felt to be most consistent with focal vascular tortuosity rather than occlusion. These results were communicated to Dr. Rory Percy at 10:52 pmon 10/18/2020by text page via the [facility] messaging system. Electronically Signed   By: Jeannine Boga M.D.   On: 12/09/2018 22:54   Dg Hip Unilat With Pelvis 2-3 Views Right  Result Date: 11/24/2018 CLINICAL DATA:  Right hip pain status post fall EXAM: DG HIP (WITH OR WITHOUT PELVIS) 2-3V RIGHT COMPARISON:  None. FINDINGS: There is an acute fracture of the parasymphyseal pubic bone on the right. There is no acute displaced fracture or dislocation of the proximal right femur. There is gaseous distention of multiple loops of  small bowel scattered throughout the lower abdomen.  IMPRESSION: 1. Acute fracture of the parasymphyseal pubic bone on the right. 2. No acute displaced fracture or dislocation of the proximal right femur. 3. Gaseous distention of multiple small bowel loops. Electronically Signed   By: Constance Holster M.D.   On: 11/24/2018 12:43    EEG 10/19 Description:  Artifact is prominent during the recording often obscuring the background rhythm. When able to be visualized the background consists of a low voltage, symmetrical, fairly well organized, 9 Hz alpha activity, seen from the parieto-occipital and posterior temporal regions.  Low voltage fast activity, poorly organized, is seen anteriorly and is at times superimposed on more posterior regions.  A mixture of theta and alpha rhythms are seen from the central and temporal regions. The patient does not drowse or sleep. No epileptiform activity is noted.   Hyperventilation and intermittent photic stimulation were not performed  IMPRESSION: This is a normal awake electroencephalogram. There are no focal lateralizing or epileptiform features.     Overnight EEG 10/20 IMPRESSION: This study showed multiple seizures arising from left temporal region during which patient had double finding her words, speaking, lasting about 4 minutes on average.  Concomitant EEG showed rhythmic delta activity in the left temporal region which gradually evolved into to theta slowing and spread to involve all of left hemisphere.   Discharge Exam: Vitals:   12/13/18 0516 12/13/18 0856  BP:  125/81  Pulse: 97 100  Resp:  17  Temp:  98.5 F (36.9 C)  SpO2:  99%     General: Pt is alert, awake, not in acute distress Cardiovascular: RRR, S1/S2 +, no edema Respiratory: CTA bilaterally, no wheezing, no rhonchi, no respiratory distress, no conversational dyspnea  Abdominal: Soft, NT, ND, bowel sounds + Extremities: no edema, no cyanosis Psych: Normal mood and affect, stable judgement and insight     The results of  significant diagnostics from this hospitalization (including imaging, microbiology, ancillary and laboratory) are listed below for reference.     Microbiology: Recent Results (from the past 240 hour(s))  SARS CORONAVIRUS 2 (TAT 6-24 HRS) Nasopharyngeal Nasopharyngeal Swab     Status: None   Collection Time: 12/09/18 11:29 PM   Specimen: Nasopharyngeal Swab  Result Value Ref Range Status   SARS Coronavirus 2 NEGATIVE NEGATIVE Final    Comment: (NOTE) SARS-CoV-2 target nucleic acids are NOT DETECTED. The SARS-CoV-2 RNA is generally detectable in upper and lower respiratory specimens during the acute phase of infection. Negative results do not preclude SARS-CoV-2 infection, do not rule out co-infections with other pathogens, and should not be used as the sole basis for treatment or other patient management decisions. Negative results must be combined with clinical observations, patient history, and epidemiological information. The expected result is Negative. Fact Sheet for Patients: SugarRoll.be Fact Sheet for Healthcare Providers: https://www.woods-mathews.com/ This test is not yet approved or cleared by the [place] FDA and  has been authorized for detection and/or diagnosis of SARS-CoV-2 by FDA under an Emergency Use Authorization (EUA). This EUA will remain  in effect (meaning this test can be used) for the duration of the COVID-19 declaration under Section 56 4(b)(1) of the Act, 21 U.S.C. section 360bbb-3(b)(1), unless the authorization is terminated or revoked sooner. Performed at [facility], [place] [address], [place], [place] [address]      Labs: BNP (last 3 results) No results for input(s): BNP in the last 8760 hours. Basic Metabolic Panel: Recent Labs  Lab  12/10/18 [id number]  12/11/18 0939 12/11/18 1400 12/11/18 2057 12/12/18 0614 12/13/18 0402  NA 126*   < > 125* 126* 125* 128* 130*  K 3.6   < > 3.4* 3.3* 3.5 3.7  3.8  CL 95*   < > 90* 90* 91* 94* 95*  CO2 22   < > 23 23 22 23 24   GLUCOSE 108*   < > 107* 142* 99 100* 109*  BUN 6*   < > <5* 9 7* 7* 12  CREATININE 0.54   < > 0.57 0.48 0.53 0.53 0.64  CALCIUM 8.5*   < > 8.9 8.9 8.9 8.9 8.9  MG 1.7  --   --   --   --   --   --   PHOS 3.8  --   --   --   --   --   --    < > = values in this interval not displayed.   Liver Function Tests: Recent Labs  Lab 12/09/18 1910  AST 24  ALT 19  ALKPHOS 146*  BILITOT 0.7  PROT 7.4  ALBUMIN 3.8   No results for input(s): LIPASE, AMYLASE in the last 168 hours. No results for input(s): AMMONIA in the last 168 hours. CBC: Recent Labs  Lab 12/09/18 1910 12/11/18 0239 12/12/18 0614  WBC 12.7* 9.2 9.7  NEUTROABS 9.0*  --   --   HGB 13.1 12.0 11.7*  HCT 37.4 32.5* 33.7*  MCV 94.7 93.7 95.2  PLT 553* 484* 490*   Cardiac Enzymes: No results for input(s): CKTOTAL, CKMB, CKMBINDEX, TROPONINI in the last 168 hours. BNP: Invalid input(s): POCBNP CBG: No results for input(s): GLUCAP in the last 168 hours. D-Dimer No results for input(s): DDIMER in the last 72 hours. Hgb A1c No results for input(s): HGBA1C in the last 72 hours. Lipid Profile No results for input(s): CHOL, HDL, LDLCALC, TRIG, CHOLHDL, LDLDIRECT in the last 72 hours. Thyroid function studies No results for input(s): TSH, T4TOTAL, T3FREE, THYROIDAB in the last 72 hours.  Invalid input(s): FREET3 Anemia work up No results for input(s): VITAMINB12, FOLATE, FERRITIN, TIBC, IRON, RETICCTPCT in the last 72 hours. Urinalysis    Component Value Date/Time   COLORURINE YELLOW 12/09/2018 1942   APPEARANCEUR CLEAR 12/09/2018 1942   LABSPEC <1.005 (L) 12/09/2018 1942   PHURINE 6.0 12/09/2018 1942   GLUCOSEU 100 (A) 12/09/2018 1942   HGBUR NEGATIVE 12/09/2018 1942   BILIRUBINUR NEGATIVE 12/09/2018 1942   KETONESUR NEGATIVE 12/09/2018 1942   PROTEINUR NEGATIVE 12/09/2018 1942   UROBILINOGEN 0.2 06/25/2011 1923   NITRITE NEGATIVE 12/09/2018  1942   LEUKOCYTESUR NEGATIVE 12/09/2018 1942   Sepsis Labs Invalid input(s): PROCALCITONIN,  WBC,  LACTICIDVEN Microbiology Recent Results (from the past 240 hour(s))  SARS CORONAVIRUS 2 (TAT 6-24 HRS) Nasopharyngeal Nasopharyngeal Swab     Status: None   Collection Time: 12/09/18 11:29 PM   Specimen: Nasopharyngeal Swab  Result Value Ref Range Status   SARS Coronavirus 2 NEGATIVE NEGATIVE Final    Comment: (NOTE) SARS-CoV-2 target nucleic acids are NOT DETECTED. The SARS-CoV-2 RNA is generally detectable in upper and lower respiratory specimens during the acute phase of infection. Negative results do not preclude SARS-CoV-2 infection, do not rule out co-infections with other pathogens, and should not be used as the sole basis for treatment or other patient management decisions. Negative results must be combined with clinical observations, patient history, and epidemiological information. The expected result is Negative. Fact Sheet for Patients: SugarRoll.be Fact Sheet for  Healthcare Providers: https://www.woods-mathews.com/ This test is not yet approved or cleared by the [place] and  has been authorized for detection and/or diagnosis of SARS-CoV-2 by FDA under an Emergency Use Authorization (EUA). This EUA will remain  in effect (meaning this test can be used) for the duration of the COVID-19 declaration under Section 56 4(b)(1) of the Act, 21 U.S.C. section 360bbb-3(b)(1), unless the authorization is terminated or revoked sooner. Performed at [facility], [place] [address] [place], [place] [address]      Patient was seen and examined on the day of discharge and was found to be in stable condition. Time coordinating discharge: 40 minutes including assessment and coordination of care, as well as examination of the patient.   SIGNED:  Dessa Phi, DO [facility] 12/13/2018, 11:35 AM

## 2018-12-13 NOTE — Progress Notes (Addendum)
Reason for consult: Seizure  Subjective: No further seizures overnight.  Patient states she had some, sleeping last night because of pain in her right hand.  Denies any other concerns.  Asking about discharge.  States her home health has been set up.   ROS: negative except above  Examination  Vital signs in last 24 hours: Temp:  [98.2 F (36.8 C)-99.3 F (37.4 C)] 98.5 F (36.9 C) (10/22 0856) Pulse Rate:  [81-100] 100 (10/22 0856) Resp:  [17-18] 17 (10/22 0856) BP: (125-133)/(61-82) 125/81 (10/22 0856) SpO2:  [98 %-99 %] 99 % (10/22 0856)  General: lying in bed, not in apparent distress CVS: pulse-normal rate and rhythm RS: breathing comfortably Extremities: normal, warm  Neuro: MS: Alert, oriented, follows commands CN: pupils equal and reactive,  EOMI, face symmetric, tongue midline, normal sensation over face, Motor: 5/5 strength in all 4 extremities Reflexes: 2+ bilaterally over patella, biceps, plantars: flexor Coordination: normal Gait: not tested  Basic Metabolic Panel: Recent Labs  Lab 12/10/18 0812  12/11/18 0939 12/11/18 1400 12/11/18 2057 12/12/18 0614 12/13/18 0402  NA 126*   < > 125* 126* 125* 128* 130*  K 3.6   < > 3.4* 3.3* 3.5 3.7 3.8  CL 95*   < > 90* 90* 91* 94* 95*  CO2 22   < > 23 23 22 23 24   GLUCOSE 108*   < > 107* 142* 99 100* 109*  BUN 6*   < > <5* 9 7* 7* 12  CREATININE 0.54   < > 0.57 0.48 0.53 0.53 0.64  CALCIUM 8.5*   < > 8.9 8.9 8.9 8.9 8.9  MG 1.7  --   --   --   --   --   --   PHOS 3.8  --   --   --   --   --   --    < > = values in this interval not displayed.    CBC: Recent Labs  Lab 12/09/18 1910 12/11/18 0239 12/12/18 0614  WBC 12.7* 9.2 9.7  NEUTROABS 9.0*  --   --   HGB 13.1 12.0 11.7*  HCT 37.4 32.5* 33.7*  MCV 94.7 93.7 95.2  PLT 553* 484* 490*     Coagulation Studies: No results for input(s): LABPROT, INR in the last 72 hours.  Imaging MRI brain without contrast 10/19/ongoing: No acute  abnormality.   ASSESSMENT AND PLAN:73 year old female with hyponatremia who presented with new onset speech disturbance and word finding difficulty. Concomitant EEG showed rhythmic left posterior temporal slowing consistent with seizures.    Speech disturbance New onset seizures Hyponatremia -As patient has not had seizures before and her MRI brain did not show any acute abnormality, it is possible that these are provoked seizures in the setting of hyponatremia -However patient was having very frequent seizures and therefore we started keppra.  Recommendations - Continue Keppra 1000mg  BID for maintenance. Continue pyridoxine 100mg  while on keppra. -DC LTM as patient has been seizure-free for over 24 hours. -Once hyponatremia is corrected if patient does not have any further seizures, keppra can possibly be weaned off as an outpatient. -Seizure precautions including do not drive. -Follow-up with neurology as an outpatient 6 to 8 weeks   Thank you for allowing [place] to participate in the care of this patient. Neurology will follow up.  Please page neuro hospitalist after 5 PM for any questions.   I have spent a total of  35  minutes with the patient reviewing hospital  notes,  test results, labs and examining the patient as well as establishing an assessment and plan that was discussed personally with the patient.  > 50% of time was spent in direct patient care.  Discussed the importance of taking seizure medication, follow-up in clinic, seizure precautions .  We also discussed that seizure provoking factors including sleep deprivation, alcohol use.

## 2018-12-20 ENCOUNTER — Telehealth: Payer: Self-pay | Admitting: Orthopaedic Surgery

## 2018-12-20 NOTE — Telephone Encounter (Signed)
She has an appointment tomorrow and we can fill it then

## 2018-12-20 NOTE — Telephone Encounter (Signed)
Patient called needing Rx refilled (Hydrocodone) Patient uses the [facility] on [address]   Ph# [phone]  The number to contact patient is [phone]

## 2018-12-20 NOTE — Telephone Encounter (Signed)
Please advise 

## 2018-12-20 NOTE — Telephone Encounter (Signed)
Spoke with patient and she will discuss her medications tomorrow with Dr.Whitfield.

## 2018-12-21 ENCOUNTER — Other Ambulatory Visit: Payer: Self-pay

## 2018-12-21 ENCOUNTER — Ambulatory Visit: Payer: Self-pay

## 2018-12-21 ENCOUNTER — Ambulatory Visit (INDEPENDENT_AMBULATORY_CARE_PROVIDER_SITE_OTHER): Payer: Medicare Other | Admitting: Orthopaedic Surgery

## 2018-12-21 ENCOUNTER — Encounter: Payer: Self-pay | Admitting: Orthopaedic Surgery

## 2018-12-21 VITALS — Ht 63.0 in | Wt 150.0 lb

## 2018-12-21 DIAGNOSIS — R102 Pelvic and perineal pain: Secondary | ICD-10-CM

## 2018-12-21 DIAGNOSIS — S32511A Fracture of superior rim of right pubis, initial encounter for closed fracture: Secondary | ICD-10-CM

## 2018-12-21 MED ORDER — HYDROCODONE-ACETAMINOPHEN 5-325 MG PO TABS: 1.0000 | ORAL_TABLET | Freq: Four times a day (QID) | ORAL | 0 refills | Status: DC | PRN

## 2018-12-21 NOTE — Progress Notes (Signed)
Office Visit Note   Patient: Mindy Young           Date of Birth: 1945-08-24           MRN: [id number] Visit Date: 12/21/2018              Requested by: Lennie Odor, PA-C [address] [place],  [place] [address] PCP: Lennie Odor, PA-C   Assessment & Plan: Visit Diagnoses:  1. Pain in pelvis   2. Closed fracture of superior ramus of right pubis, initial encounter ([place])     Plan: Pelvic fracture and involves superior pubic rami with some extension into the pubic symphysis.  Minimal displacement of the fracture.  Will continue with home health therapy and weightbearing as tolerated with a walker.  Renew hydrocodone Office 1 month Follow-Up Instructions: Return in about 4 weeks (around 01/18/2019).   Orders:  Orders Placed This Encounter  Procedures  . XR Pelvis 1-2 Views   No orders of the defined types were placed in this encounter.     Procedures: No procedures performed   Clinical Data: No additional findings.   Subjective: Chief Complaint  Patient presents with  . Pelvis - Injury    DOI 11/24/2018  Patient presents today for a pelvis injury. She was walking her service dog at the park and her dog bolted and took off, causing her to fall down on her right knee and elbow. Upon standing up she had intense pain and unable to bear weight on the right leg. She went to [facility] ED in [facility] and had x-rays. She was told that she fractured her pelvis. She then went to [place] to be admitted for pain control. Since then she has been admitted again for seizure activity. She is taking hydrocodone 5/325 for pain. She wants to talk about pain medicine today. Has received some home health therapy and is able to ambulate with some difficulty using a walker.  Pain is localized to the right side.  I did review her initial films and there is a fracture of the superior pubic rami with some extension into the superior right pubis.  No fracture about the hip.  No  radiculopathy.  No back pain HPI  Review of Systems   Objective: Vital Signs: Ht 5\' 3"  (1.6 m)   Wt 150 lb (68 kg)   BMI 26.57 kg/m   Physical Exam Constitutional:      Appearance: She is well-developed.  Eyes:     Pupils: Pupils are equal, round, and reactive to light.  Pulmonary:     Effort: Pulmonary effort is normal.  Skin:    General: Skin is warm and dry.  Neurological:     Mental Status: She is alert and oriented to person, place, and time.  Psychiatric:        Behavior: Behavior normal.     Ortho Exam evaluated in a wheelchair.  Does have a service dog for her seizures.  Does have some pain with flexion extension internal and external rotation of her right hip referred to the pelvic region.  No pain to palpation about the proximal femur.  No percussible back pain  Specialty Comments:  No specialty comments available.  Imaging: No results found.   PMFS History: Patient Active Problem List   Diagnosis Date Noted  . GERD (gastroesophageal reflux disease) 12/09/2018  . Anxiety 12/09/2018  . Difficulty speaking 12/09/2018  . Leukocytosis 12/09/2018  . Seizure-like activity ([place]) 12/09/2018  .  Pelvic fracture ([place]) 11/25/2018  . Pubic bone fracture ([place]) 11/24/2018  . Left knee pain 03/29/2018  . Unilateral primary osteoarthritis, left knee 03/29/2018  . Genetic testing 01/24/2018  . Family history of breast cancer   . Family history of prostate cancer   . Malignant neoplasm of lower-inner quadrant of right breast of female, estrogen receptor positive ([place]) 10/24/2017  . Anemia 06/29/2011  . Hypertension   . Hyponatremia 06/25/2011  . Transaminitis 06/25/2011  . Alcohol abuse 06/25/2011  . Cough 06/25/2011  . Cigarette smoker 06/25/2011   Past Medical History:  Diagnosis Date  . Allergy   . Anemia 06/29/2011  . Anxiety   . Arthritis   . Breast cancer ([place])    right breast  . C1 cervical fracture ([place]) 02/1997  . Cancer ([place])    melanoma  .  Depression   . Family history of breast cancer   . Family history of prostate cancer   . GERD (gastroesophageal reflux disease)   . Hypertension   . Tardive dyskinesia     Family History  Problem Relation Age of Onset  . COPD Mother   . Prostate cancer Father 32       seed implant for treatment  . Colon polyps Father        'a few'  . Breast cancer Sister 33  . Breast cancer Maternal Aunt        dx >50  . Breast cancer Other 35       bilateral  . Colon cancer Neg Hx     Past Surgical History:  Procedure Laterality Date  . ABDOMINAL HYSTERECTOMY    . ASPIRATION OF ABSCESS Right 11/20/2017   Procedure: ASPIRATION OF RIGHT AXILLARY SEROMA;  Surgeon: Rolm Bookbinder, MD;  Location: [facility];  Service: General;  Laterality: Right;  . AUGMENTATION MAMMAPLASTY Bilateral    biateral implants , approx 2015  . BREAST LUMPECTOMY Right 11/02/2017   re-ex 11-20-17  . BREAST LUMPECTOMY WITH RADIOACTIVE SEED AND SENTINEL LYMPH NODE BIOPSY Right 11/02/2017   Procedure: BREAST LUMPECTOMY WITH RADIOACTIVE SEED AND SENTINEL LYMPH NODE BIOPSY;  Surgeon: Rolm Bookbinder, MD;  Location: [facility];  Service: General;  Laterality: Right;  . CHOLECYSTECTOMY    . collarbone    . INCONTINENCE SURGERY    . KNEE SURGERY     removal of cyst, repair of cartiledge  . LAPAROSCOPY     for endometriosis  . RE-EXCISION OF BREAST LUMPECTOMY Right 11/20/2017   Procedure: RE-EXCISION OF RIGHT BREAST MARGINS;  Surgeon: Rolm Bookbinder, MD;  Location: [facility];  Service: General;  Laterality: Right;  . ROTATOR CUFF REPAIR     left   Social History   Occupational History  . Not on file  Tobacco Use  . Smoking status: Current Every Day Smoker    Packs/day: 1.00    Types: Cigarettes  . Smokeless tobacco: Never Used  . Tobacco comment: e-cigs  Substance and Sexual Activity  . Alcohol use: Yes    Alcohol/week: 21.0 standard drinks    Types: 21  Standard drinks or equivalent per week    Comment: 2 per weekend day; denies during the week (not consistent with prior hx)  . Drug use: No  . Sexual activity: Not on file

## 2018-12-27 ENCOUNTER — Other Ambulatory Visit: Payer: Self-pay | Admitting: Orthopedic Surgery

## 2018-12-27 ENCOUNTER — Telehealth: Payer: Self-pay | Admitting: Orthopaedic Surgery

## 2018-12-27 MED ORDER — HYDROCODONE-ACETAMINOPHEN 5-325 MG PO TABS: 1.0000 | ORAL_TABLET | Freq: Four times a day (QID) | ORAL | 0 refills | Status: DC | PRN

## 2018-12-27 NOTE — Telephone Encounter (Signed)
Spoke with patient. She is aware that the medication has been sent in. I also made her aware that this might be her last pain medicine refill until her next appointment on 01/22/2019.

## 2018-12-27 NOTE — Telephone Encounter (Signed)
Sent in....needs appt next week

## 2018-12-27 NOTE — Telephone Encounter (Signed)
Please advise. Please wants a refill of hydrocodone sent to her pharmacy on [address].

## 2018-12-27 NOTE — Telephone Encounter (Signed)
Patient left a voicemail requesting for someone to urgently call her back.  CB#[phone].  Thank you.

## 2018-12-28 ENCOUNTER — Telehealth: Payer: Self-pay | Admitting: Orthopaedic Surgery

## 2018-12-28 NOTE — Telephone Encounter (Signed)
Received voicemail message from [place] with Mindy Young advised patient is still having pain in her lower back and the pain is worse. Cumar said patient must be declining and asked for a call back as soon as possible. The number to contact Cumar is [phone]

## 2018-12-31 ENCOUNTER — Telehealth: Payer: Self-pay | Admitting: Orthopaedic Surgery

## 2018-12-31 NOTE — Telephone Encounter (Signed)
Can you please call and schedule patient a return visit to see Dr.Whitfield based upon recommendations from Dr.Whitfield and her home health nurse.

## 2018-12-31 NOTE — Telephone Encounter (Signed)
Spoke with patient. She said that she would like something stronger for pain. She is aware that Dr.Whitfield will not be in the office until tomorrow at 1pm. She has an appointment to come in on Wednesday for further evaluation and x-ray. Her therapist is going to hold off on therapy until she is rechecked.

## 2018-12-31 NOTE — Telephone Encounter (Signed)
Needs return to office for spine films-thanks. This week

## 2018-12-31 NOTE — Telephone Encounter (Signed)
[facility] nurse called in left vm requesting a call back from someone asap regarding pt current pain status, said he believes pt needs to be seen for pelvic fracture and get an x-ray but first wanted to discuss whats going on with the pt right now.    [phone]

## 2018-12-31 NOTE — Telephone Encounter (Signed)
Just refilled hydrocodone on 11/5 #30- ask her to take 2 tabs q3-4 h rather than 1. If not effective can try dilaudid-thanks

## 2018-12-31 NOTE — Telephone Encounter (Signed)
Patient called advised the pain medicine is not working and asked if she can get something a little stronger. Patient said she is allergic to percocet. The number to contact patient is [phone]

## 2018-12-31 NOTE — Telephone Encounter (Signed)
Spoke with patient's therapist. He wanted to make sure that we were going to see her sooner than the original appointment. She is scheduled for Wednesday 01/02/2019.

## 2018-12-31 NOTE — Telephone Encounter (Signed)
Thoughts?

## 2018-12-31 NOTE — Telephone Encounter (Signed)
Pt called in said she needing to speak to dr.whitfield or brian ASAP about a fall she had a month ago when she broke her pelvis and ended up in the emergency and started having seizers. Pt believes she has hurt her pelvis again she has been in a lot of pain since last Wednesday 11/4.  Please give her a call [phone]

## 2019-01-01 NOTE — Telephone Encounter (Signed)
Spoke with patient. She will try the 2 tablets every 3-4 hours. She has an appointment tomorrow, so if no improvement she will let us know.

## 2019-01-02 ENCOUNTER — Ambulatory Visit: Payer: Self-pay

## 2019-01-02 ENCOUNTER — Ambulatory Visit: Payer: Medicare Other | Admitting: Orthopaedic Surgery

## 2019-01-02 ENCOUNTER — Encounter: Payer: Self-pay | Admitting: Orthopaedic Surgery

## 2019-01-02 ENCOUNTER — Other Ambulatory Visit: Payer: Self-pay

## 2019-01-02 DIAGNOSIS — S32511G Fracture of superior rim of right pubis, subsequent encounter for fracture with delayed healing: Secondary | ICD-10-CM

## 2019-01-02 DIAGNOSIS — R102 Pelvic and perineal pain: Secondary | ICD-10-CM

## 2019-01-02 MED ORDER — HYDROCODONE-ACETAMINOPHEN 5-325 MG PO TABS: 1.0000 | ORAL_TABLET | Freq: Four times a day (QID) | ORAL | 0 refills | Status: DC | PRN

## 2019-01-02 NOTE — Progress Notes (Signed)
Office Visit Note   Patient: Mindy Young           Date of Birth: 1945-08-15           MRN: [id number] Visit Date: 01/02/2019              Requested by: Lennie Odor, PA-C [address] [place],  [place] [address] PCP: Lennie Odor, PA-C   Assessment & Plan: Visit Diagnoses:  1. Pain in pelvis   2. Closed fracture of superior ramus of right pubis with delayed healing, subsequent encounter     Plan: 5-1/2 weeks status post injury resulting in right pelvis fracture involving the superior pubic rami and pubis.  Feeling a little bit better but still having a fair amount of pain.  Taking hydrocodone 06/24/2023 every 4 hours either 1 or 2 tabs.  Having increased pain over the last several days and had her come in today for repeat films.  There did not appear to be any displacement.  Mindy Young does smoke and I think that is going to certainly slower her healing response.  We had a long discussion regarding her smoking.  Will renew her hydrocodone and see her back in a month.  Weightbearing as tolerated  Follow-Up Instructions: Return in about 1 month (around 02/01/2019).   Orders:  Orders Placed This Encounter  Procedures  . XR Pelvis 1-2 Views   Meds ordered this encounter  Medications  . HYDROcodone-acetaminophen (NORCO/VICODIN) 5-325 MG tablet    Sig: Take 1-2 tablets by mouth every 6 (six) hours as needed for moderate pain.    Dispense:  30 tablet    Refill:  0      Procedures: No procedures performed   Clinical Data: No additional findings.   Subjective: Chief Complaint  Patient presents with  . Lower Back - Pain    Pt comes in today for pelvis pain. Fx Nov 24, 2018. Walking Dog and fell. Pain level today (7/10). Taking Hydrocodone 5mg . Ambulates in wheelchair. Pain in leg, buttock and groin area. +[facility]. Not WB only shifting. 12/26/2018 pain got worse.       HPI  Review of Systems   Objective: Vital Signs: There were no vitals taken for  this visit.  Physical Exam Constitutional:      Appearance: She is well-developed.  Eyes:     Pupils: Pupils are equal, round, and reactive to light.  Pulmonary:     Effort: Pulmonary effort is normal.  Skin:    General: Skin is warm and dry.  Neurological:     Mental Status: She is alert and oriented to person, place, and time.  Psychiatric:        Behavior: Behavior normal.     Ortho Exam awake alert and oriented x3.  Comfortable sitting.  Evaluated in a wheelchair.  Still having some pain with rotation and flexion extension of the right hip but maybe a little less than it was when she was previously here.  No calf pain.  Motor exam intact.  No back pain.  Specialty Comments:  No specialty comments available.  Imaging: Xr Pelvis 1-2 Views  Result Date: 01/02/2019 AP of the pelvis was obtained demonstrating the previously identified fracture of the superior pubic rami extending into the superior pubis.  No significant displacement.  Might be some early callus formation.    PMFS History: Patient Active Problem List   Diagnosis Date Noted  . GERD (gastroesophageal reflux disease) 12/09/2018  .  Anxiety 12/09/2018  . Difficulty speaking 12/09/2018  . Leukocytosis 12/09/2018  . Seizure-like activity ([place]) 12/09/2018  . Pelvic fracture ([place]) 11/25/2018  . Pubic bone fracture ([place]) 11/24/2018  . Left knee pain 03/29/2018  . Unilateral primary osteoarthritis, left knee 03/29/2018  . Genetic testing 01/24/2018  . Family history of breast cancer   . Family history of prostate cancer   . Malignant neoplasm of lower-inner quadrant of right breast of female, estrogen receptor positive ([place]) 10/24/2017  . Anemia 06/29/2011  . Hypertension   . Hyponatremia 06/25/2011  . Transaminitis 06/25/2011  . Alcohol abuse 06/25/2011  . Cough 06/25/2011  . Cigarette smoker 06/25/2011   Past Medical History:  Diagnosis Date  . Allergy   . Anemia 06/29/2011  . Anxiety   . Arthritis   .  Breast cancer ([place])    right breast  . C1 cervical fracture ([place]) 02/1997  . Cancer ([place])    melanoma  . Depression   . Family history of breast cancer   . Family history of prostate cancer   . GERD (gastroesophageal reflux disease)   . Hypertension   . Tardive dyskinesia     Family History  Problem Relation Age of Onset  . COPD Mother   . Prostate cancer Father 17       seed implant for treatment  . Colon polyps Father        'a few'  . Breast cancer Sister 71  . Breast cancer Maternal Aunt        dx >50  . Breast cancer Other 35       bilateral  . Colon cancer Neg Hx     Past Surgical History:  Procedure Laterality Date  . ABDOMINAL HYSTERECTOMY    . ASPIRATION OF ABSCESS Right 11/20/2017   Procedure: ASPIRATION OF RIGHT AXILLARY SEROMA;  Surgeon: Rolm Bookbinder, MD;  Location: [facility];  Service: General;  Laterality: Right;  . AUGMENTATION MAMMAPLASTY Bilateral    biateral implants , approx 2015  . BREAST LUMPECTOMY Right 11/02/2017   re-ex 11-20-17  . BREAST LUMPECTOMY WITH RADIOACTIVE SEED AND SENTINEL LYMPH NODE BIOPSY Right 11/02/2017   Procedure: BREAST LUMPECTOMY WITH RADIOACTIVE SEED AND SENTINEL LYMPH NODE BIOPSY;  Surgeon: Rolm Bookbinder, MD;  Location: [facility];  Service: General;  Laterality: Right;  . CHOLECYSTECTOMY    . collarbone    . INCONTINENCE SURGERY    . KNEE SURGERY     removal of cyst, repair of cartiledge  . LAPAROSCOPY     for endometriosis  . RE-EXCISION OF BREAST LUMPECTOMY Right 11/20/2017   Procedure: RE-EXCISION OF RIGHT BREAST MARGINS;  Surgeon: Rolm Bookbinder, MD;  Location: [facility];  Service: General;  Laterality: Right;  . ROTATOR CUFF REPAIR     left   Social History   Occupational History  . Not on file  Tobacco Use  . Smoking status: Current Every Day Smoker    Packs/day: 1.00    Types: Cigarettes  . Smokeless tobacco: Never Used  . Tobacco comment: e-cigs   Substance and Sexual Activity  . Alcohol use: Yes    Alcohol/week: 21.0 standard drinks    Types: 21 Standard drinks or equivalent per week    Comment: 2 per weekend day; denies during the week (not consistent with prior hx)  . Drug use: No  . Sexual activity: Not on file

## 2019-01-08 ENCOUNTER — Telehealth: Payer: Self-pay | Admitting: Orthopaedic Surgery

## 2019-01-08 ENCOUNTER — Other Ambulatory Visit: Payer: Self-pay | Admitting: Orthopaedic Surgery

## 2019-01-08 MED ORDER — HYDROCODONE-ACETAMINOPHEN 5-325 MG PO TABS: 1.0000 | ORAL_TABLET | Freq: Four times a day (QID) | ORAL | 0 refills | Status: DC | PRN

## 2019-01-08 NOTE — Telephone Encounter (Signed)
Renewed yesterday-please check pharmacy

## 2019-01-08 NOTE — Telephone Encounter (Signed)
I renewed yesterday via imprivata

## 2019-01-08 NOTE — Telephone Encounter (Signed)
Pt called in requesting a refill on Hydrocodone, please have that sent to [facility] on [address].   [phone]

## 2019-01-08 NOTE — Telephone Encounter (Signed)
Spoke with patient and let her know that hydrocodone has been sent to her pharmacy.

## 2019-01-08 NOTE — Telephone Encounter (Signed)
Patient left a voicemail requesting an RX refill on her Hydrocodone.  CB#[id number].  Thank you.

## 2019-01-08 NOTE — Telephone Encounter (Signed)
Please advise. second message

## 2019-01-08 NOTE — Telephone Encounter (Signed)
Please advise 

## 2019-01-10 ENCOUNTER — Telehealth: Payer: Self-pay | Admitting: Hematology and Oncology

## 2019-01-10 ENCOUNTER — Other Ambulatory Visit: Payer: Self-pay | Admitting: Hematology and Oncology

## 2019-01-10 ENCOUNTER — Other Ambulatory Visit: Payer: Self-pay | Admitting: Gastroenterology

## 2019-01-10 NOTE — Telephone Encounter (Signed)
Scheduled appt per 11/19 sch message - pt is aware of appt added. My chart active and she will check my chart later

## 2019-01-14 NOTE — Progress Notes (Deleted)
Virtual Visit via Video Note The purpose of this virtual visit is to provide medical care while limiting exposure to the novel coronavirus.    Consent was obtained for video visit:  {yes no:314532} Answered questions that patient had about telehealth interaction:  {yes no:314532} I discussed the limitations, risks, security and privacy concerns of performing an evaluation and management service by telemedicine. I also discussed with the patient that there may be a patient responsible charge related to this service. The patient expressed understanding and agreed to proceed.  Pt location: Home Physician Location: office Name of referring provider:  Lennie Odor, PA-C I connected with [name] at patients initiation/request on 01/21/2019 at  9:15 AM EST by video enabled telemedicine application and verified that I am speaking with the correct person using two identifiers. Pt MRN:  [id number] Pt DOB:  10-05-1945 Video Participants:  [name];  ***   History of Present Illness: *** Patient seen today for hospital follow-up.  I saw her back in February, 2020, but that was for a different reason (tardive dyskinesia in the setting of antipsychotics, and history of fall in setting of alcohol use).  Most recently, she presented to the emergency room on December 09, 2018.  I have reviewed the records.  Earlier in the day, the patient apparently had some difficulty speaking and stuttering.  In route to the hospital, she had an episode in the ambulance where she had left-sided gaze preference and was not responding with her eyes open.  She was transferred from [facility] to [place].  Her alcohol level was less than 10.  Sodium was 121.  She was hooked up to long-term monitoring.  The patient pushed the event button multiple times for word finding difficulties, and EEG demonstrated rhythmic delta activity in the left temporal region which gradually evolved into theta of the left hemisphere.  She was  started on Keppra, 1000 mg twice per day.  She was also given pyridoxine.  I personally reviewed her MRI of the brain dated December 10, 2018.  There was fairly significant cerebral small vessel disease.  This had progressed since 2013.   Current Outpatient Medications on File Prior to Visit  Medication Sig Dispense Refill  . ALPRAZolam (XANAX) 0.5 MG tablet Take 0.5 mg by mouth 2 (two) times daily as needed for anxiety.     Marland Kitchen amLODipine (NORVASC) 5 MG tablet Take 5 mg by mouth daily.    . cetirizine (ZYRTEC) 10 MG tablet Take 10 mg by mouth as needed for allergies.    . cloNIDine (CATAPRES) 0.1 MG tablet Take 1 tablet by mouth 2 (two) times daily.  0  . HYDROcodone-acetaminophen (NORCO/VICODIN) 5-325 MG tablet Take 1 tablet by mouth every 6 (six) hours as needed for moderate pain. 30 tablet 0  . HYDROcodone-acetaminophen (NORCO/VICODIN) 5-325 MG tablet Take 1 tablet by mouth every 6 (six) hours as needed for moderate pain. 30 tablet 0  . HYDROcodone-acetaminophen (NORCO/VICODIN) 5-325 MG tablet Take 1-2 tablets by mouth every 6 (six) hours as needed for moderate pain. 30 tablet 0  . HYDROcodone-acetaminophen (NORCO/VICODIN) 5-325 MG tablet Take 1 tablet by mouth every 6 (six) hours as needed for moderate pain. 30 tablet 0  . letrozole (FEMARA) 2.5 MG tablet TAKE 1 TABLET(2.5 MG) BY MOUTH DAILY 90 tablet 0  . levETIRAcetam (KEPPRA) 1000 MG tablet Take 1 tablet (1,000 mg total) by mouth 2 (two) times daily. 60 tablet 2  . methylphenidate (RITALIN) 20 MG tablet Take  20 mg by mouth daily.     . metoprolol succinate (TOPROL-XL) 100 MG 24 hr tablet Take 100 mg by mouth daily.     Marland Kitchen omeprazole (PRILOSEC) 40 MG capsule TAKE 1 CAPSULE BY MOUTH TWICE DAILY 30 MINUTES BEFORE BREAKFAST AND 30 MINUTES BEFORE DINNER (Patient taking differently: Take 40 mg by mouth 2 (two) times daily. 30 minutes before breakfast, and 30  minutes before dinner.) 180 capsule 0  . oxybutynin (DITROPAN-XL) 10 MG 24 hr tablet Take 10  mg by mouth daily.   0  . polyethylene glycol (MIRALAX / GLYCOLAX) 17 g packet Take 17 g by mouth daily as needed for moderate constipation. 14 each 0  . promethazine (PHENERGAN) 25 MG tablet Take 25 mg by mouth every 6 (six) hours as needed for nausea or vomiting.    . pyridOXINE (B-6) 100 MG tablet Take 1 tablet (100 mg total) by mouth daily. 30 tablet 2   No current facility-administered medications on file prior to visit.      Observations/Objective:   There were no vitals filed for this visit. GEN:  The patient appears stated age and is in NAD.  Neurological examination:  Orientation: The patient is alert and oriented x3. Cranial nerves: There is good facial symmetry. There is ***facial hypomimia.  The speech is fluent and clear. Soft palate rises symmetrically and there is no tongue deviation. Hearing is intact to conversational tone. Motor: Strength is at least antigravity x 4.   Shoulder shrug is equal and symmetric.  There is no pronator drift.  Movement examination: Tone: unable Abnormal movements: *** Coordination:  There is *** decremation with RAM's, *** Gait and Station: The patient has *** difficulty arising out of a deep-seated chair without the use of the hands. The patient's stride length is ***.      Assessment and Plan:   1.  New onset seizure  -Patient did have an abnormal EEG, with seizures during the EEG (long-term monitoring).  Seizures were manifested by difficulty with speech, and accompanied by left temporal theta.  Patient was significantly hyponatremic.  Alcohol level was nonnegotiable.  Patient does have a history of alcohol abuse.  Discussed with the patient the importance of staying off of alcohol.  -She will continue her Keppra, 1000 mg twice per day.  Risk, benefits, side effects discussed.  -I called PCP as she was to have repeat Na since hospital stay.  She changed pcp and they reported that only lab she had done was BNP (not BMP).  ***we will need  to repeat BMP.  Follow Up Instructions:    -I discussed the assessment and treatment plan with the patient. The patient was provided an opportunity to ask questions and all were answered. The patient agreed with the plan and demonstrated an understanding of the instructions.   The patient was advised to call back or seek an in-person evaluation if the symptoms worsen or if the condition fails to improve as anticipated.    Total Time spent in visit with the patient was:  ***, of which more than 50% of the time was spent in counseling and/or coordinating care on ***.   Pt understands and agrees with the plan of care outlined.     Alonza Bogus, DO

## 2019-01-15 ENCOUNTER — Other Ambulatory Visit: Payer: Self-pay | Admitting: Orthopedic Surgery

## 2019-01-15 ENCOUNTER — Telehealth: Payer: Self-pay | Admitting: Orthopaedic Surgery

## 2019-01-15 MED ORDER — HYDROCODONE-ACETAMINOPHEN 5-325 MG PO TABS: 1.0000 | ORAL_TABLET | Freq: Four times a day (QID) | ORAL | 0 refills | Status: DC | PRN

## 2019-01-15 NOTE — Telephone Encounter (Signed)
Done

## 2019-01-15 NOTE — Telephone Encounter (Signed)
Please advise 

## 2019-01-15 NOTE — Telephone Encounter (Signed)
Patient called needing Rx refilled (Hydrocodone) The number to contact patient is [phone]

## 2019-01-15 NOTE — Telephone Encounter (Signed)
Please advise....second message

## 2019-01-15 NOTE — Telephone Encounter (Signed)
Pt called in requesting a refill on hydrocodone, please have that sent to [facility] on [address]. Pt said she needs this refilled before holiday weekend.   [phone]

## 2019-01-21 ENCOUNTER — Ambulatory Visit: Payer: Medicare Other | Admitting: Neurology

## 2019-01-21 ENCOUNTER — Telehealth: Payer: Self-pay | Admitting: Neurology

## 2019-01-21 NOTE — Telephone Encounter (Signed)
Pt cancelled NP appt scheduled for today

## 2019-01-22 ENCOUNTER — Ambulatory Visit: Payer: Medicare Other | Admitting: Orthopaedic Surgery

## 2019-01-28 ENCOUNTER — Telehealth: Payer: Self-pay | Admitting: Orthopaedic Surgery

## 2019-01-28 ENCOUNTER — Other Ambulatory Visit: Payer: Self-pay | Admitting: Orthopaedic Surgery

## 2019-01-28 MED ORDER — HYDROCODONE-ACETAMINOPHEN 5-325 MG PO TABS: 1.0000 | ORAL_TABLET | Freq: Four times a day (QID) | ORAL | 0 refills | Status: DC | PRN

## 2019-01-28 NOTE — Telephone Encounter (Signed)
Spoke with patient and told her that medicine has been sent in to her pharmacy.

## 2019-01-28 NOTE — Telephone Encounter (Signed)
sent 

## 2019-01-28 NOTE — Telephone Encounter (Signed)
Patient called left voicemail message needing Rx refilled for pain medicine. The number to contact patient is [phone]

## 2019-01-28 NOTE — Telephone Encounter (Signed)
Pt called in requesting a refill on her pain medication Hydrocodone, said she ran out of her medication yesterday and doesn't come in to see dr.whitfield till 12/9 and cant wait until then to refill it. Please have that sent to [facility] on [address].    [phone]

## 2019-01-28 NOTE — Telephone Encounter (Signed)
Please advise 

## 2019-01-30 ENCOUNTER — Other Ambulatory Visit: Payer: Self-pay

## 2019-01-30 ENCOUNTER — Ambulatory Visit: Payer: Medicare Other | Admitting: Orthopaedic Surgery

## 2019-01-30 ENCOUNTER — Encounter: Payer: Self-pay | Admitting: Orthopaedic Surgery

## 2019-01-30 VITALS — Ht 63.0 in | Wt 150.0 lb

## 2019-01-30 DIAGNOSIS — S32511G Fracture of superior rim of right pubis, subsequent encounter for fracture with delayed healing: Secondary | ICD-10-CM | POA: Diagnosis not present

## 2019-01-30 MED ORDER — METHYLPREDNISOLONE ACETATE 40 MG/ML IJ SUSP
80.0000 mg | INTRAMUSCULAR | Status: AC | PRN
  Administered 2019-01-30: 80 mg via INTRA_ARTICULAR

## 2019-01-30 MED ORDER — BUPIVACAINE HCL 0.5 % IJ SOLN
2.0000 mL | INTRAMUSCULAR | Status: AC | PRN
  Administered 2019-01-30: 11:00:00 2 mL via INTRA_ARTICULAR

## 2019-01-30 MED ORDER — LIDOCAINE HCL 1 % IJ SOLN
2.0000 mL | INTRAMUSCULAR | Status: AC | PRN
  Administered 2019-01-30: 2 mL

## 2019-01-30 NOTE — Progress Notes (Signed)
Office Visit Note   Patient: Mindy Young           Date of Birth: 1945/07/04           MRN: [id number] Visit Date: 01/30/2019              Requested by: Lennie Odor, PA-C [address] [place],  [place] [address] PCP: Lennie Odor, PA-C   Assessment & Plan: Visit Diagnoses:  1. Closed fracture of superior ramus of right pubis with delayed healing, subsequent encounter     Plan: Approximately 9 weeks status post right hemipelvis fracture of superior pubic rami and extending into the pubis.  Actually doing quite well at this point and spending more time on her feet ambulating.  Long discussion regarding chronic use of hydrocodone.  She had a renewal several days ago and that should be her last.  Continue working on exercises and walking with a walker.  She is feeling much better.  Would like to see her again in 1 month and then repeat the films.  Has a history of tobacco and alcohol abuse. Also has a history of arthritis in the left knee with prior cortisone injection.  Has been experiencing some recurrent pain and will inject that today  Follow-Up Instructions: Return in about 1 month (around 03/02/2019).   Orders:  No orders of the defined types were placed in this encounter.  No orders of the defined types were placed in this encounter.     Procedures: Large Joint Inj: L knee on 01/30/2019 11:28 AM Indications: pain and diagnostic evaluation Details: 25 G 1.5 in needle, anteromedial approach  Arthrogram: No  Medications: 2 mL lidocaine 1 %; 2 mL bupivacaine 0.5 %; 80 mg methylPREDNISolone acetate 40 MG/ML Procedure, treatment alternatives, risks and benefits explained, specific risks discussed. Consent was given by the patient. Patient was prepped and draped in the usual sterile fashion.       Clinical Data: No additional findings.   Subjective: Chief Complaint  Patient presents with  . Right Hip - Follow-up  Patient presents today for follow up on  her pelvis fracture. She said that she feels like she is improving. She has been moving around more with walking and stairs. She is taking the hydrocodone as needed. She is doing therapy weekly.  Has been ambulating more and more and spending less time in the wheelchair and feeling generally "much improved".  Sometimes takes the hydrocodone more in anticipation of the pain then actually experiencing the discomfort  HPI  Review of Systems   Objective: Vital Signs: Ht 5\' 3"  (1.6 m)   Wt 150 lb (68 kg)   BMI 26.57 kg/m   Physical Exam Constitutional:      Appearance: She is well-developed.  Eyes:     Pupils: Pupils are equal, round, and reactive to light.  Pulmonary:     Effort: Pulmonary effort is normal.  Skin:    General: Skin is warm and dry.  Neurological:     Mental Status: She is alert and oriented to person, place, and time.  Psychiatric:        Behavior: Behavior normal.     Ortho Exam awake alert and oriented x3.  Exam in the wheelchair.  Had very minimal discomfort with flexion extension internal and external rotation of her right hip.  Has been experiencing symptoms of recurrent left knee pain.  Has had prior cortisone injection for arthritis.  Will reinject today.  Small effusion  left knee with mild medial joint pain.  Full extension and flexion over 105 degrees without instability  Specialty Comments:  No specialty comments available.  Imaging: No results found.   PMFS History: Patient Active Problem List   Diagnosis Date Noted  . GERD (gastroesophageal reflux disease) 12/09/2018  . Anxiety 12/09/2018  . Difficulty speaking 12/09/2018  . Leukocytosis 12/09/2018  . Seizure-like activity ([place]) 12/09/2018  . Pelvic fracture ([place]) 11/25/2018  . Pubic bone fracture ([place]) 11/24/2018  . Left knee pain 03/29/2018  . Unilateral primary osteoarthritis, left knee 03/29/2018  . Genetic testing 01/24/2018  . Family history of breast cancer   . Family history of  prostate cancer   . Malignant neoplasm of lower-inner quadrant of right breast of female, estrogen receptor positive ([place]) 10/24/2017  . Anemia 06/29/2011  . Hypertension   . Hyponatremia 06/25/2011  . Transaminitis 06/25/2011  . Alcohol abuse 06/25/2011  . Cough 06/25/2011  . Cigarette smoker 06/25/2011   Past Medical History:  Diagnosis Date  . Allergy   . Anemia 06/29/2011  . Anxiety   . Arthritis   . Breast cancer ([place])    right breast  . C1 cervical fracture ([place]) 02/1997  . Cancer ([place])    melanoma  . Depression   . Family history of breast cancer   . Family history of prostate cancer   . GERD (gastroesophageal reflux disease)   . Hypertension   . Tardive dyskinesia     Family History  Problem Relation Age of Onset  . COPD Mother   . Prostate cancer Father 58       seed implant for treatment  . Colon polyps Father        'a few'  . Breast cancer Sister 41  . Breast cancer Maternal Aunt        dx >50  . Breast cancer Other 35       bilateral  . Colon cancer Neg Hx     Past Surgical History:  Procedure Laterality Date  . ABDOMINAL HYSTERECTOMY    . ASPIRATION OF ABSCESS Right 11/20/2017   Procedure: ASPIRATION OF RIGHT AXILLARY SEROMA;  Surgeon: Rolm Bookbinder, MD;  Location: [facility];  Service: General;  Laterality: Right;  . AUGMENTATION MAMMAPLASTY Bilateral    biateral implants , approx 2015  . BREAST LUMPECTOMY Right 11/02/2017   re-ex 11-20-17  . BREAST LUMPECTOMY WITH RADIOACTIVE SEED AND SENTINEL LYMPH NODE BIOPSY Right 11/02/2017   Procedure: BREAST LUMPECTOMY WITH RADIOACTIVE SEED AND SENTINEL LYMPH NODE BIOPSY;  Surgeon: Rolm Bookbinder, MD;  Location: [facility];  Service: General;  Laterality: Right;  . CHOLECYSTECTOMY    . collarbone    . INCONTINENCE SURGERY    . KNEE SURGERY     removal of cyst, repair of cartiledge  . LAPAROSCOPY     for endometriosis  . RE-EXCISION OF BREAST LUMPECTOMY Right 11/20/2017    Procedure: RE-EXCISION OF RIGHT BREAST MARGINS;  Surgeon: Rolm Bookbinder, MD;  Location: [facility];  Service: General;  Laterality: Right;  . ROTATOR CUFF REPAIR     left   Social History   Occupational History  . Not on file  Tobacco Use  . Smoking status: Current Every Day Smoker    Packs/day: 1.00    Types: Cigarettes  . Smokeless tobacco: Never Used  . Tobacco comment: e-cigs  Substance and Sexual Activity  . Alcohol use: Yes    Alcohol/week: 21.0 standard drinks  Types: 21 Standard drinks or equivalent per week    Comment: 2 per weekend day; denies during the week (not consistent with prior hx)  . Drug use: No  . Sexual activity: Not on file

## 2019-02-07 ENCOUNTER — Encounter: Payer: Self-pay | Admitting: Orthopaedic Surgery

## 2019-02-07 ENCOUNTER — Ambulatory Visit: Payer: Self-pay

## 2019-02-07 ENCOUNTER — Ambulatory Visit: Payer: Medicare Other | Admitting: Orthopaedic Surgery

## 2019-02-07 ENCOUNTER — Ambulatory Visit (INDEPENDENT_AMBULATORY_CARE_PROVIDER_SITE_OTHER): Payer: Medicare Other

## 2019-02-07 ENCOUNTER — Other Ambulatory Visit: Payer: Self-pay

## 2019-02-07 VITALS — Ht 63.0 in | Wt 150.0 lb

## 2019-02-07 DIAGNOSIS — M545 Low back pain, unspecified: Secondary | ICD-10-CM

## 2019-02-07 DIAGNOSIS — R102 Pelvic and perineal pain: Secondary | ICD-10-CM | POA: Diagnosis not present

## 2019-02-07 MED ORDER — HYDROCODONE-ACETAMINOPHEN 5-325 MG PO TABS: 1.0000 | ORAL_TABLET | Freq: Four times a day (QID) | ORAL | 0 refills | Status: DC | PRN

## 2019-02-07 NOTE — Progress Notes (Signed)
Office Visit Note   Patient: Mindy Young           Date of Birth: 1946-02-04           MRN: [id number] Visit Date: 02/07/2019              Requested by: Lennie Odor, PA-C [address] [place],  [place] [address] PCP: Lennie Odor, PA-C   Assessment & Plan: Visit Diagnoses:  1. Pain in pelvis   2. Acute bilateral low back pain without sciatica     Plan: 10 weeks status post essentially nondisplaced fractures of the superior, inferior pubic rami with extension into the pubis.  Was doing quite well with minimal discomfort until this past Sunday i.e. 4 days ago when she had another fall.  Since that time she has been experiencing back and buttock pain.  She has got a bruise on the right buttock but notes she is really not having much pain into either lower extremity.  She denies any bowel or bladder dysfunction.  Films today reveal no displacement of the pelvic fracture but there is abundant callus.  There is considerable degenerative change in the lumbar spine with an anterior listhesis of grade 2 at L4-5 and significant decrease in the disc space height of L5-S1.  I do not see any evidence of an acute compression fracture.  A lot of detail is obliterated by bowel gas but there is apparently no bowel or bladder dysfunction.  Will renew prescription for Vicodin.  Increase activity as tolerated.  Lumbar support office 3 to 4 weeks.  Hopefully this will be self-limiting and resolve without any further treatment.  Follow-Up Instructions: Return in about 1 month (around 03/10/2019).   Orders:  Orders Placed This Encounter  Procedures  . XR Lumbar Spine 2-3 Views  . XR Pelvis 1-2 Views   No orders of the defined types were placed in this encounter.     Procedures: No procedures performed   Clinical Data: No additional findings.   Subjective: Chief Complaint  Patient presents with  . Left Hip - Pain    DOI 02/03/2019  . Lower Back - Pain  Patient presents today  for left hip and lower back pain. She was visiting family in [place] and fell on her left side on 02/03/2019. She said that she has been hurting since. Her left hip hurts at her buttock and lower back. No pain radiating down her legs, and no numbness or tingling. She said that it is very painful to even touch her buttock area. She is out of pain medicine.  10 weeks status post right hemipelvis fracture as previously described been doing well until she had this fall on Sunday i.e. 4 days ago.  Since then she has been experiencing back and buttock pain.  She is got a bruise on her right buttock but notes she is not having any numbness or tingling or weakness of either lower extremity and not having much pelvic pain.  HPI  Review of Systems  Constitutional: Negative for fatigue.  HENT: Negative for ear pain.   Eyes: Negative for pain.  Respiratory: Negative for shortness of breath.   Cardiovascular: Negative for leg swelling.  Gastrointestinal: Negative for constipation and diarrhea.  Endocrine: Positive for cold intolerance. Negative for heat intolerance.  Genitourinary: Negative for difficulty urinating.  Musculoskeletal: Negative for joint swelling.  Skin: Negative for rash.  Allergic/Immunologic: Negative for food allergies.  Neurological: Negative for weakness.  Hematological: Bruises/bleeds  easily.  Psychiatric/Behavioral: Negative for sleep disturbance.     Objective: Vital Signs: Ht 5\' 3"  (1.6 m)   Wt 150 lb (68 kg)   BMI 26.57 kg/m   Physical Exam Constitutional:      Appearance: She is well-developed.  Eyes:     Pupils: Pupils are equal, round, and reactive to light.  Pulmonary:     Effort: Pulmonary effort is normal.  Skin:    General: Skin is warm and dry.  Neurological:     Mental Status: She is alert and oriented to person, place, and time.  Psychiatric:        Behavior: Behavior normal.     Ortho Exam evaluated in wheelchair.  No shortness of breath or chest  pain.  Painless range of motion of both hips with flexion extension internal and external rotation.  Motor and sensory exam was intact to both lower extremities.  There was some tenderness over the greater trochanter of her left hip and some percussible tenderness of lumbar spine.  I thought her pain was out of proportion to what I saw on the x-rays.  Specialty Comments:  No specialty comments available.  Imaging: XR Lumbar Spine 2-3 Views  Result Date: 02/07/2019 Films lumbar spine obtained in 2 projections.  There is a grade 2 anterior listhesis of L4 on 5.  There is significant decrease in the disc space height at L5-S1 and facet sclerosis at L4-5 and L5-S1.  No obvious compression fracture.  XR Pelvis 1-2 Views  Result Date: 02/07/2019 AP the pelvis demonstrates callus about these superior and inferior pubic rami fractures with extension into the pubis.  Did not appear to be any change from prior films other than the increased callus.  No new fractures.    PMFS History: Patient Active Problem List   Diagnosis Date Noted  . Low back pain 02/07/2019  . GERD (gastroesophageal reflux disease) 12/09/2018  . Anxiety 12/09/2018  . Difficulty speaking 12/09/2018  . Leukocytosis 12/09/2018  . Seizure-like activity ([place]) 12/09/2018  . Pelvic fracture ([place]) 11/25/2018  . Pubic bone fracture ([place]) 11/24/2018  . Left knee pain 03/29/2018  . Unilateral primary osteoarthritis, left knee 03/29/2018  . Genetic testing 01/24/2018  . Family history of breast cancer   . Family history of prostate cancer   . Malignant neoplasm of lower-inner quadrant of right breast of female, estrogen receptor positive ([place]) 10/24/2017  . Anemia 06/29/2011  . Hypertension   . Hyponatremia 06/25/2011  . Transaminitis 06/25/2011  . Alcohol abuse 06/25/2011  . Cough 06/25/2011  . Cigarette smoker 06/25/2011   Past Medical History:  Diagnosis Date  . Allergy   . Anemia 06/29/2011  . Anxiety   . Arthritis     . Breast cancer ([place])    right breast  . C1 cervical fracture ([place]) 02/1997  . Cancer ([place])    melanoma  . Depression   . Family history of breast cancer   . Family history of prostate cancer   . GERD (gastroesophageal reflux disease)   . Hypertension   . Tardive dyskinesia     Family History  Problem Relation Age of Onset  . COPD Mother   . Prostate cancer Father 47       seed implant for treatment  . Colon polyps Father        'a few'  . Breast cancer Sister 45  . Breast cancer Maternal Aunt        dx >50  . Breast cancer  Other 35       bilateral  . Colon cancer Neg Hx     Past Surgical History:  Procedure Laterality Date  . ABDOMINAL HYSTERECTOMY    . ASPIRATION OF ABSCESS Right 11/20/2017   Procedure: ASPIRATION OF RIGHT AXILLARY SEROMA;  Surgeon: Rolm Bookbinder, MD;  Location: [facility];  Service: General;  Laterality: Right;  . AUGMENTATION MAMMAPLASTY Bilateral    biateral implants , approx 2015  . BREAST LUMPECTOMY Right 11/02/2017   re-ex 11-20-17  . BREAST LUMPECTOMY WITH RADIOACTIVE SEED AND SENTINEL LYMPH NODE BIOPSY Right 11/02/2017   Procedure: BREAST LUMPECTOMY WITH RADIOACTIVE SEED AND SENTINEL LYMPH NODE BIOPSY;  Surgeon: Rolm Bookbinder, MD;  Location: [facility];  Service: General;  Laterality: Right;  . CHOLECYSTECTOMY    . collarbone    . INCONTINENCE SURGERY    . KNEE SURGERY     removal of cyst, repair of cartiledge  . LAPAROSCOPY     for endometriosis  . RE-EXCISION OF BREAST LUMPECTOMY Right 11/20/2017   Procedure: RE-EXCISION OF RIGHT BREAST MARGINS;  Surgeon: Rolm Bookbinder, MD;  Location: [facility];  Service: General;  Laterality: Right;  . ROTATOR CUFF REPAIR     left   Social History   Occupational History  . Not on file  Tobacco Use  . Smoking status: Current Every Day Smoker    Packs/day: 1.00    Types: Cigarettes  . Smokeless tobacco: Never Used  . Tobacco comment: e-cigs   Substance and Sexual Activity  . Alcohol use: Yes    Alcohol/week: 21.0 standard drinks    Types: 21 Standard drinks or equivalent per week    Comment: 2 per weekend day; denies during the week (not consistent with prior hx)  . Drug use: No  . Sexual activity: Not on file

## 2019-02-21 ENCOUNTER — Ambulatory Visit: Payer: Self-pay

## 2019-02-21 ENCOUNTER — Other Ambulatory Visit: Payer: Self-pay

## 2019-02-21 ENCOUNTER — Encounter: Payer: Self-pay | Admitting: Orthopaedic Surgery

## 2019-02-21 ENCOUNTER — Ambulatory Visit (INDEPENDENT_AMBULATORY_CARE_PROVIDER_SITE_OTHER): Payer: Medicare Other | Admitting: Orthopaedic Surgery

## 2019-02-21 DIAGNOSIS — M25512 Pain in left shoulder: Secondary | ICD-10-CM | POA: Diagnosis not present

## 2019-02-21 DIAGNOSIS — R0781 Pleurodynia: Secondary | ICD-10-CM | POA: Diagnosis not present

## 2019-02-21 DIAGNOSIS — S32501A Unspecified fracture of right pubis, initial encounter for closed fracture: Secondary | ICD-10-CM | POA: Diagnosis not present

## 2019-02-21 DIAGNOSIS — M545 Low back pain, unspecified: Secondary | ICD-10-CM

## 2019-02-21 DIAGNOSIS — R0782 Intercostal pain: Secondary | ICD-10-CM | POA: Diagnosis not present

## 2019-02-21 NOTE — Progress Notes (Signed)
Office Visit Note   Patient: Mindy Young           Date of Birth: 1945/04/17           MRN: [id number] Visit Date: 02/21/2019              Requested by: Lennie Odor, PA-C [address] [place],  [place] [address] PCP: Lennie Odor, PA-C   Assessment & Plan: Visit Diagnoses:  1. Acute bilateral low back pain without sciatica   2. Acute pain of left shoulder   3. Closed fracture of right pubis, unspecified portion of pubis, initial encounter [facility])     Plan: Feeling much better in terms of her pelvic fracture notes that she is driving herself and even walking around the house without the use of a walker.  1 week ago she fell, yet again, landing on her left side.  She thinks she simply tripped over her bathrobe.  This is at least the second time since her pelvic fracture that she is fallen.  She is "not sure why".  She has been experiencing pain in the area of her left shoulder.  1999 she had rotator cuff surgery and feels like it "might be the same problem".  Films of her left shoulder were negative.  She might have a rotator cuff injury.  Will reevaluate over the next several weeks with possible MRI scan if no improvement. Continues to improve in terms of her pelvic fracture as previously identified and will increase her activity level as tolerated.  I had a long discussion with her regarding use of her hydrocodone.  She has had about 300 pills in the last 3 months so we need to limit how much she gets she is aware that Films of the right ribs were negative for fracture.  She is not short of breath and the lung was fully inflated.  We will plan to check her back in 1 month and further evaluate her injuries.  I again discussed my concern that she is falling.  Office visit over 1 hour   Follow-Up Instructions: No follow-ups on file.   Orders:  Orders Placed This Encounter  Procedures  . XR Shoulder Left   No orders of the defined types were placed in this  encounter.     Procedures: No procedures performed   Clinical Data: No additional findings.   Subjective: Chief Complaint  Patient presents with  . Lower Back - Pain  Patient presents today for a two week follow up on her lower back and pelvis pain. She is now 12 weeks out from a pelvis fracture. She was here two weeks after a recent fall and was given a back support brace to wear. Since her last visit she has fallen again. She was on her porch and tripped over a stool on 02/14/2019. She said that she is having pain all over.  Specifically she is having more trouble with her left shoulder.  She notes that she had surgery in that same shoulder for rotator cuff tear in 1999 was doing well until this fall.  In terms of her pelvic fracture she has been up and around the house without much problem walking without the walker in the house and even driving.  Also notes having some pain along the right chest wall after this more recent fall but not having any significant problems with breathing.  HPI  Review of Systems   Objective: Vital Signs: There were no vitals  taken for this visit.  Physical Exam Constitutional:      Appearance: She is well-developed.  Eyes:     Pupils: Pupils are equal, round, and reactive to light.  Pulmonary:     Effort: Pulmonary effort is normal.  Skin:    General: Skin is warm and dry.  Neurological:     Mental Status: She is alert and oriented to person, place, and time.  Psychiatric:        Behavior: Behavior normal.     Ortho Exam ambulates with a rolling walker.  Did not have any significant pain with motion of her right hip.  Very minimal percussible tenderness lumbar spine.  Her biggest complaint today was with her left shoulder.  I could place her arm overhead and negative impingement but she had difficulty initiating overhead motion and abduction related to her pain.  Good grip and good release.  No pain with range of motion cervical spine,  however, there was decreased motion which she notes is chronic.  Had some areas of tenderness along the lower right rib cage in the mid axillary line.  No crepitation.  Did not appear to have any shortness of breath  Specialty Comments:  No specialty comments available.  Imaging: No results found.   PMFS History: Patient Active Problem List   Diagnosis Date Noted  . Pain in left shoulder 02/21/2019  . Low back pain 02/07/2019  . GERD (gastroesophageal reflux disease) 12/09/2018  . Anxiety 12/09/2018  . Difficulty speaking 12/09/2018  . Leukocytosis 12/09/2018  . Seizure-like activity ([place]) 12/09/2018  . Pelvic fracture ([place]) 11/25/2018  . Pubic bone fracture ([place]) 11/24/2018  . Left knee pain 03/29/2018  . Unilateral primary osteoarthritis, left knee 03/29/2018  . Genetic testing 01/24/2018  . Family history of breast cancer   . Family history of prostate cancer   . Malignant neoplasm of lower-inner quadrant of right breast of female, estrogen receptor positive ([place]) 10/24/2017  . Anemia 06/29/2011  . Hypertension   . Hyponatremia 06/25/2011  . Transaminitis 06/25/2011  . Alcohol abuse 06/25/2011  . Cough 06/25/2011  . Cigarette smoker 06/25/2011   Past Medical History:  Diagnosis Date  . Allergy   . Anemia 06/29/2011  . Anxiety   . Arthritis   . Breast cancer ([place])    right breast  . C1 cervical fracture ([place]) 02/1997  . Cancer ([place])    melanoma  . Depression   . Family history of breast cancer   . Family history of prostate cancer   . GERD (gastroesophageal reflux disease)   . Hypertension   . Tardive dyskinesia     Family History  Problem Relation Age of Onset  . COPD Mother   . Prostate cancer Father 53       seed implant for treatment  . Colon polyps Father        'a few'  . Breast cancer Sister 48  . Breast cancer Maternal Aunt        dx >50  . Breast cancer Other 35       bilateral  . Colon cancer Neg Hx     Past Surgical History:  Procedure  Laterality Date  . ABDOMINAL HYSTERECTOMY    . ASPIRATION OF ABSCESS Right 11/20/2017   Procedure: ASPIRATION OF RIGHT AXILLARY SEROMA;  Surgeon: Rolm Bookbinder, MD;  Location: [facility];  Service: General;  Laterality: Right;  . AUGMENTATION MAMMAPLASTY Bilateral    biateral implants , approx 2015  .  BREAST LUMPECTOMY Right 11/02/2017   re-ex 11-20-17  . BREAST LUMPECTOMY WITH RADIOACTIVE SEED AND SENTINEL LYMPH NODE BIOPSY Right 11/02/2017   Procedure: BREAST LUMPECTOMY WITH RADIOACTIVE SEED AND SENTINEL LYMPH NODE BIOPSY;  Surgeon: Rolm Bookbinder, MD;  Location: [facility];  Service: General;  Laterality: Right;  . CHOLECYSTECTOMY    . collarbone    . INCONTINENCE SURGERY    . KNEE SURGERY     removal of cyst, repair of cartiledge  . LAPAROSCOPY     for endometriosis  . RE-EXCISION OF BREAST LUMPECTOMY Right 11/20/2017   Procedure: RE-EXCISION OF RIGHT BREAST MARGINS;  Surgeon: Rolm Bookbinder, MD;  Location: [facility];  Service: General;  Laterality: Right;  . ROTATOR CUFF REPAIR     left   Social History   Occupational History  . Not on file  Tobacco Use  . Smoking status: Current Every Day Smoker    Packs/day: 1.00    Types: Cigarettes  . Smokeless tobacco: Never Used  . Tobacco comment: e-cigs  Substance and Sexual Activity  . Alcohol use: Yes    Alcohol/week: 21.0 standard drinks    Types: 21 Standard drinks or equivalent per week    Comment: 2 per weekend day; denies during the week (not consistent with prior hx)  . Drug use: No  . Sexual activity: Not on file

## 2019-02-26 ENCOUNTER — Telehealth: Payer: Self-pay | Admitting: Orthopaedic Surgery

## 2019-02-26 ENCOUNTER — Other Ambulatory Visit: Payer: Self-pay | Admitting: Orthopaedic Surgery

## 2019-02-26 DIAGNOSIS — M25512 Pain in left shoulder: Secondary | ICD-10-CM

## 2019-02-26 NOTE — Telephone Encounter (Signed)
Spoke with patient. MRI of left shoulder has been placed.

## 2019-02-26 NOTE — Telephone Encounter (Signed)
Call pt and ask what is worse back or shoulder-then MRI of the more symptomatic area

## 2019-02-26 NOTE — Telephone Encounter (Signed)
Patient called asked if she can be set up to have an MRI? Patient said she still have the knot on her back. Patient said her left rotator cuff still hurts. The number to contact patient is [phone]

## 2019-02-26 NOTE — Telephone Encounter (Signed)
Please advise 

## 2019-02-27 ENCOUNTER — Ambulatory Visit: Payer: Medicare Other | Admitting: Orthopaedic Surgery

## 2019-02-28 ENCOUNTER — Ambulatory Visit: Payer: Medicare Other | Admitting: Neurology

## 2019-03-01 ENCOUNTER — Other Ambulatory Visit: Payer: Self-pay | Admitting: Surgical

## 2019-03-01 ENCOUNTER — Other Ambulatory Visit: Payer: Self-pay | Admitting: Orthopaedic Surgery

## 2019-03-01 MED ORDER — TRAMADOL HCL 50 MG PO TABS: 50.0000 mg | ORAL_TABLET | Freq: Two times a day (BID) | ORAL | 0 refills | Status: AC | PRN

## 2019-03-01 NOTE — Telephone Encounter (Signed)
Please advise 

## 2019-03-01 NOTE — Telephone Encounter (Signed)
Called and spoke with patient.  She is complaining of "continued pain from rib fractures".  Last note reviewed, no obvious fracture to patient's ribs.  She is still in significant pain.  She has had large quantities of narcotic pain medication over the past few months.  Recently had a discussion with Dr. Durward Fortes regarding reducing the amount of narcotic pain medication that she is taking.  She emphasizes that she is not taking any hydrocodone since 02/07/2019.  She notes that she was told by Dr. Durward Fortes on 02/21/2019 that he would refill her hydrocodone in the meantime between that visit and her MRI.  Discussed options with patient regarding her pain.  Due to the large quantities of hydrocodone that she has been taking and her discussion with Dr. Durward Fortes, a prescription for tramadol was prescribed to her pharmacy.  She has scheduled return office visit with Dr. Durward Fortes on March 14, 2019.

## 2019-03-01 NOTE — Telephone Encounter (Signed)
Patient called stated she was told Mindy Young would call a refill for Hydrocodone  Please call patient @ [phone]

## 2019-03-01 NOTE — Telephone Encounter (Signed)
Spoke with patient. I explained that she was not able to have anymore hydrocodone until after her MRI scan is complete. Patient states that she "hurts all over" and needs something. She thought that Dr.Whitfield said he would send her in more hydrocodone at her last appointment because she heard him to say that she needed to take it. I read the dictation from her last visit to her about how he wanted her to stop the narcotics due to having over 300 pills filled in the last 3 months. She wanted me to let Dr.Whitfield that she hopes he will reconsider and let her know if he does.

## 2019-03-04 ENCOUNTER — Telehealth: Payer: Self-pay

## 2019-03-04 NOTE — Telephone Encounter (Signed)
Spoke with patient's nurse who stays with her. She said that Mindy Young is wanting her calls to go through her. I explained all the information from Dr.Whitfield. She understands and will relay this to [name]. Her nurse states that she received Tramadol for pain from another provider since our last conversation on Friday. Her pain in her shoulder and scapula area is the biggest concern for patient. Her nurse states that she is trying to get in touch with Dr.Neal about her seeing a neurologist due to sudden outburst of anger.

## 2019-03-04 NOTE — Telephone Encounter (Signed)
Patient calling in again due to pain.

## 2019-03-04 NOTE — Telephone Encounter (Signed)
Thanks-agree. Could check with her primary care physician re more meds but no more from [place] until after the MRI

## 2019-03-04 NOTE — Telephone Encounter (Signed)
MRI of her lumbar spine if still that uncomfortable. What is status of shoulder and MRI? Can check with her primary care MD for any meds prior to her return to our office. Had long discussion with her last visit re use of too many hydrocodone. Needs to wait until  return to office and results of scans before I prescribe further meds

## 2019-03-04 NOTE — Telephone Encounter (Signed)
Received message from answering service that patient was having rib pain/back pain. No call back number was provided. Please try to reach patient. Thanks.

## 2019-03-05 ENCOUNTER — Telehealth: Payer: Self-pay | Admitting: Orthopaedic Surgery

## 2019-03-05 NOTE — Telephone Encounter (Signed)
Thanks-will reevaluate after scans

## 2019-03-05 NOTE — Telephone Encounter (Signed)
Patient called needing Rx refilled Hydrocodone. Patient said she have not had Hydrocodone since 02/17/2019 Patient is trying to reschedule her MRI for a sooner appointment. Patient will call and schedule her MRI review after she get sooner appointment. The number to contact patient is [phone]

## 2019-03-05 NOTE — Telephone Encounter (Signed)
Spoke with patient again. I explained again that she was not going to get anymore pain medications from Dr.Whitfield. She needs to go through her PCP.

## 2019-03-07 ENCOUNTER — Telehealth: Payer: Self-pay | Admitting: Neurology

## 2019-03-07 NOTE — Telephone Encounter (Signed)
Received phone call from physician for women, Dr. Emilie Rutter.  Stated that family was really worried about pt.  Expressed that I was as well but pt has cancelled several NP appointments, the most recent was just last week on 1/7.  He stated that no one had expressed that to him.  He has known pt much of her life.  Asked him if she was still drinking alcohol to excess.  He was aware this was an issue in past but didn't ask if was still drinkng.  He will express to pt to call and make a follow up appt and keep the appt

## 2019-03-14 ENCOUNTER — Ambulatory Visit: Payer: Medicare Other | Admitting: Orthopaedic Surgery

## 2019-03-14 NOTE — Progress Notes (Signed)
Mindy Young was seen today in neurologic consultation at the request of Redmon, Barth Kirks, PA-C.  The consultation is for the evaluation of seizure.  Outside records that were made available to me were reviewed, including multiple hospital records.  I have also discussed the case on the phone with Dr. Nori Riis, who called me to discuss her case.  Case also discussed with epileptologist in our office.   Caregiver accompanies pt and supplements hx. Patient was seen by me in February, 2020, but that was for tardive dyskinesia due to antipsychotics.  We also discussed heavy alcohol use at the time.  She comes today for a different reason.  She was in the hospital in October with seizure.  Patient was at the emergency room the day prior to admission.  She was there for pain in the hip following a fall as well as constipation.  She was evaluated and sent home.  The home health care aide check on her around 11 am, and thought she was fine and around 1130, she had an episode where she could not bring out her words and she was staring into space.  She did not lose consciousness, but was not necessarily responding to questions.  This apparently happened several times.  She was transferred from the [facility] to [facility], and while in route she had an episode in the ambulance where she had left-sided gaze preference and was not responding with the eyes open.  She had weakness on the left side.  She was able to follow commands with the right side of the body.  She apparently also had a similar episode while in the emergency room.  Sodium was very low at 121.  She had an EEG.  She had multiple episodes during the EEG.  Episodes correlated with left temporal seizures.  She saw neurology/epilepsy at the hospital.  It was felt that these could have been provoked in the setting of hyponatremia.  However, because of the frequency of the seizures, AEDs were initiated.  She was started on Keppra, as well as pyridoxine.  It  was noted that once the hyponatremia was corrected, the patient did not have any further seizures, it was possible that the patient could be weaned off her AED.  Patient was discharged on Keppra, 1000 mg twice per day.  She states that she is not taking as directed.  She is only taking q day for the last week or so. Compliance is better when caregivers around.  Caregivers have noted that mood/agitation better but she has recently cut out lots of medication, not just the keppra.   She was told to follow-up here.  She actually had several visits which she canceled.  Pt states that she has had no alcohol since 09/2018.      ALLERGIES:   Allergies  Allergen Reactions  . Percocet [Oxycodone-Acetaminophen] Other (See Comments)    "bugs crawling on me"    CURRENT MEDICATIONS:  Outpatient Encounter Medications as of 03/19/2019  Medication Sig  . ALPRAZolam (XANAX) 0.5 MG tablet Take 0.5 mg by mouth 2 (two) times daily as needed for anxiety.   Marland Kitchen amLODipine (NORVASC) 5 MG tablet Take 5 mg by mouth daily.  . cetirizine (ZYRTEC) 10 MG tablet Take 10 mg by mouth as needed for allergies.  . cloNIDine (CATAPRES) 0.1 MG tablet Take 1 tablet by mouth 2 (two) times daily.  Marland Kitchen HYDROcodone-acetaminophen (NORCO/VICODIN) 5-325 MG tablet Take 1 tablet by mouth every 6 (six)  hours as needed for moderate pain.  Marland Kitchen letrozole (FEMARA) 2.5 MG tablet TAKE 1 TABLET(2.5 MG) BY MOUTH DAILY  . levETIRAcetam (KEPPRA) 1000 MG tablet Take 1 tablet (1,000 mg total) by mouth 2 (two) times daily.  . methylphenidate (RITALIN) 20 MG tablet Take 20 mg by mouth daily.   . metoprolol succinate (TOPROL-XL) 100 MG 24 hr tablet Take 100 mg by mouth daily.   Marland Kitchen omeprazole (PRILOSEC) 40 MG capsule TAKE 1 CAPSULE BY MOUTH TWICE DAILY 30 MINUTES BEFORE BREAKFAST AND 30 MINUTES BEFORE DINNER (Patient taking differently: Take 40 mg by mouth 2 (two) times daily. 30 minutes before breakfast, and 30  minutes before dinner.)  . oxybutynin (DITROPAN-XL)  10 MG 24 hr tablet Take 10 mg by mouth daily.   . polyethylene glycol (MIRALAX / GLYCOLAX) 17 g packet Take 17 g by mouth daily as needed for moderate constipation.  . promethazine (PHENERGAN) 25 MG tablet Take 25 mg by mouth every 6 (six) hours as needed for nausea or vomiting.  . pyridOXINE (B-6) 100 MG tablet Take 1 tablet (100 mg total) by mouth daily.  . traMADol (ULTRAM) 50 MG tablet Take 1 tablet (50 mg total) by mouth every 12 (twelve) hours as needed.  Marland Kitchen HYDROcodone-acetaminophen (NORCO/VICODIN) 5-325 MG tablet Take 1 tablet by mouth every 6 (six) hours as needed for moderate pain. (Patient not taking: Reported on 03/19/2019)  . HYDROcodone-acetaminophen (NORCO/VICODIN) 5-325 MG tablet Take 1 tablet by mouth every 6 (six) hours as needed for moderate pain. (Patient not taking: Reported on 03/19/2019)  . HYDROcodone-acetaminophen (NORCO/VICODIN) 5-325 MG tablet Take 1-2 tablets by mouth every 6 (six) hours as needed for moderate pain. (Patient not taking: Reported on 03/19/2019)  . HYDROcodone-acetaminophen (NORCO/VICODIN) 5-325 MG tablet Take 1 tablet by mouth every 6 (six) hours as needed for moderate pain. (Patient not taking: Reported on 03/19/2019)  . HYDROcodone-acetaminophen (NORCO/VICODIN) 5-325 MG tablet Take 1 tablet by mouth every 6 (six) hours as needed for moderate pain. (Patient not taking: Reported on 03/19/2019)   No facility-administered encounter medications on file as of 03/19/2019.    PHYSICAL EXAMINATION:    VITALS:   Vitals:   03/19/19 0851  BP: (!) 156/93  Pulse: (!) 114  Resp: 16  SpO2: 93%  Weight: 146 lb 3.2 oz (66.3 kg)  Height: 5\' 2"  (1.575 m)    GEN:  Normal appears female in no acute distress.  Appears stated age. HEENT:  Normocephalic, atraumatic. The mucous membranes are moist. The superficial temporal arteries are without ropiness or tenderness. Cardiovascular: tachy.  regular Lungs: Clear to auscultation bilaterally. Neck/Heme: There are no carotid  bruits noted bilaterally.  NEUROLOGICAL: Orientation:   [employer] Mental Exam 03/19/2019  Weekday Correct 1  Current year 1  What state are we in? 1  Amount spent 1  Amount left 2  # of Animals 2  5 objects recall 4  Number series 2  Hour markers 2  Time correct 2  Placed X in triangle correctly 1  Largest Figure 1  Name of female 2  Date back to work 2  Type of work 0  State she lived in 2  Total score 26   Cranial nerves: There is good facial symmetry.  Extraocular muscles are intact and visual fields are full to confrontational testing. Speech is fluent and clear. Soft palate rises symmetrically and there is no tongue deviation. Hearing is intact to conversational tone. Tone: Tone is good throughout. Sensation: Sensation is intact to light  touch and pinprick throughout (facial, trunk, extremities). Vibration is intact at the bilateral big toe. There is no extinction with double simultaneous stimulation. There is no sensory dermatomal level identified. Coordination:  The patient has no difficulty with RAM's or FNF bilaterally. Motor: Strength is 5/5 in the bilateral upper and lower extremities.  Shoulder shrug is equal and symmetric. There is no pronator drift.  There are no fasciculations noted. Gait and Station: The patient is able to ambulate without difficulty. The patient is able to heel toe walk without any difficulty. The patient is able to ambulate in a tandem fashion. The patient is able to stand in the Romberg position.  I have reviewed and interpreted the following labs independently   Chemistry      Component Value Date/Time   NA 130 (L) 12/13/2018 0402   K 3.8 12/13/2018 0402   CL 95 (L) 12/13/2018 0402   CO2 24 12/13/2018 0402   BUN 12 12/13/2018 0402   CREATININE 0.64 12/13/2018 0402   CREATININE 0.84 10/25/2017 1230      Component Value Date/Time   CALCIUM 8.9 12/13/2018 0402   ALKPHOS 146 (H) 12/09/2018 1910   AST 24 12/09/2018 1910   AST 17  10/25/2017 1230   ALT 19 12/09/2018 1910   ALT 10 10/25/2017 1230   BILITOT 0.7 12/09/2018 1910   BILITOT 0.4 10/25/2017 1230     Lab Results  Component Value Date   WBC 9.7 12/12/2018   HGB 11.7 (L) 12/12/2018   HCT 33.7 (L) 12/12/2018   MCV 95.2 12/12/2018   PLT 490 (H) 12/12/2018   Lab Results  Component Value Date   VITAMINB12 1,349 (H) 06/29/2011      IMPRESSION/PLAN  1. Seizure, multiple, in the setting of hyponatremia  -Patient had hospitalization with multiple seizures.  EEG was abnormal, demonstrating left temporal seizure.  Was felt at hospital that this could be due to hyponatremia, but was felt that she should be started on Keppra because of the large number of seizure.  I discussed this with the epileptologist in our office and we both agreed that hyponatremia generally should not cause focal, left temporal abnormalities, and we agreed that she should remain on the [place].  Patient reports that she has dropped it from 1000mg  bid to 1000mg  qday.  Discussed increasing it for now (may change it in future if EEG normal but she had a lot of seizures.  She is, however, having some mood alteration which may, in part, be due to keppra).    She and I had a long discussion about seizure and safety.  She is on Ultram.  Discussed that this lowers seizure threshold.  I would like to see her off of this medication.      -discussed EtOH.  Reports that she is free of alcohol since last august  -repeat labs, chemistry, b1, b12 today  -repeat ambulatory eeg  MDM: high  In addition to time spent today, there was an additional 35 min of record review which was detailed above, which was non face to face time, completed 03/15/19   Cc:  Lennie Odor, PA-C

## 2019-03-15 DIAGNOSIS — E871 Hypo-osmolality and hyponatremia: Secondary | ICD-10-CM | POA: Diagnosis not present

## 2019-03-15 DIAGNOSIS — R569 Unspecified convulsions: Secondary | ICD-10-CM

## 2019-03-19 ENCOUNTER — Encounter: Payer: Self-pay | Admitting: Neurology

## 2019-03-19 ENCOUNTER — Other Ambulatory Visit: Payer: Self-pay

## 2019-03-19 ENCOUNTER — Ambulatory Visit (INDEPENDENT_AMBULATORY_CARE_PROVIDER_SITE_OTHER): Payer: Medicare Other | Admitting: Neurology

## 2019-03-19 ENCOUNTER — Other Ambulatory Visit (INDEPENDENT_AMBULATORY_CARE_PROVIDER_SITE_OTHER): Payer: Medicare Other

## 2019-03-19 VITALS — BP 156/93 | HR 114 | Resp 16 | Ht 62.0 in | Wt 146.2 lb

## 2019-03-19 DIAGNOSIS — R6889 Other general symptoms and signs: Secondary | ICD-10-CM

## 2019-03-19 DIAGNOSIS — Z7289 Other problems related to lifestyle: Secondary | ICD-10-CM | POA: Diagnosis not present

## 2019-03-19 DIAGNOSIS — R569 Unspecified convulsions: Secondary | ICD-10-CM | POA: Diagnosis not present

## 2019-03-19 DIAGNOSIS — Z5181 Encounter for therapeutic drug level monitoring: Secondary | ICD-10-CM

## 2019-03-19 DIAGNOSIS — D649 Anemia, unspecified: Secondary | ICD-10-CM

## 2019-03-19 DIAGNOSIS — E871 Hypo-osmolality and hyponatremia: Secondary | ICD-10-CM

## 2019-03-19 DIAGNOSIS — Z789 Other specified health status: Secondary | ICD-10-CM

## 2019-03-19 NOTE — Patient Instructions (Signed)
1.  For now, I need you to continue your keppra at 1000 mg twice per day  Your provider has requested that you have labwork completed today. Please go to [facility] ([id number]) on the second floor of this building before leaving the office today. You do not need to check in. If you are not called within 15 minutes please check with the front desk.

## 2019-03-21 ENCOUNTER — Telehealth: Payer: Self-pay | Admitting: Orthopaedic Surgery

## 2019-03-21 NOTE — Telephone Encounter (Signed)
Patient called requesting Hydrocodone.  Please call patient to advise.  [phone]  Has not had any since 01/18/20

## 2019-03-21 NOTE — Telephone Encounter (Signed)
Spoke with patient. I explained that Dr.Whitfield is not going to prescribe her ANY pain medicine until she follows up after having the MRI scan. I then further explained that we had this same conversation two weeks ago and recommended her contact her PCP, just like I had told her two weeks ago. She said that she just spoke with her PCP and she is more than happy to prescribe her pain medicine and she will contact her. She also said that her urologist does not want her taking Tramadol, but needs to take Hydrocodone instead.

## 2019-03-22 LAB — COMPREHENSIVE METABOLIC PANEL
AG Ratio: 1.6 (calc) (ref 1.0–2.5)
ALT: 11 U/L (ref 6–29)
AST: 18 U/L (ref 10–35)
Albumin: 3.9 g/dL (ref 3.6–5.1)
Alkaline phosphatase (APISO): 85 U/L (ref 37–153)
BUN: 16 mg/dL (ref 7–25)
CO2: 26 mmol/L (ref 20–32)
Calcium: 9.5 mg/dL (ref 8.6–10.4)
Chloride: 100 mmol/L (ref 98–110)
Creat: 0.72 mg/dL (ref 0.60–0.93)
Globulin: 2.5 g/dL (calc) (ref 1.9–3.7)
Glucose, Bld: 86 mg/dL (ref 65–99)
Potassium: 4.3 mmol/L (ref 3.5–5.3)
Sodium: 136 mmol/L (ref 135–146)
Total Bilirubin: 0.4 mg/dL (ref 0.2–1.2)
Total Protein: 6.4 g/dL (ref 6.1–8.1)

## 2019-03-22 LAB — CBC
HCT: 36.9 % (ref 35.0–45.0)
Hemoglobin: 12.5 g/dL (ref 11.7–15.5)
MCH: 31.3 pg (ref 27.0–33.0)
MCHC: 33.9 g/dL (ref 32.0–36.0)
MCV: 92.3 fL (ref 80.0–100.0)
MPV: 9.9 fL (ref 7.5–12.5)
Platelets: 367 10*3/uL (ref 140–400)
RBC: 4 10*6/uL (ref 3.80–5.10)
RDW: 11.9 % (ref 11.0–15.0)
WBC: 9.3 10*3/uL (ref 3.8–10.8)

## 2019-03-22 LAB — VITAMIN B12: Vitamin B-12: 444 pg/mL (ref 200–1100)

## 2019-03-22 LAB — VITAMIN B1: Vitamin B1 (Thiamine): 12 nmol/L (ref 8–30)

## 2019-03-25 ENCOUNTER — Ambulatory Visit
Admission: RE | Admit: 2019-03-25 | Discharge: 2019-03-25 | Disposition: A | Discharge status: Home / Self Care | Payer: Medicare Other | Source: Ambulatory Visit | Attending: Orthopaedic Surgery | Admitting: Orthopaedic Surgery

## 2019-03-25 ENCOUNTER — Other Ambulatory Visit: Payer: Self-pay

## 2019-03-25 ENCOUNTER — Ambulatory Visit (INDEPENDENT_AMBULATORY_CARE_PROVIDER_SITE_OTHER): Payer: Medicare Other | Admitting: Neurology

## 2019-03-25 DIAGNOSIS — Z7289 Other problems related to lifestyle: Secondary | ICD-10-CM

## 2019-03-25 DIAGNOSIS — M25512 Pain in left shoulder: Secondary | ICD-10-CM

## 2019-03-25 DIAGNOSIS — Z5181 Encounter for therapeutic drug level monitoring: Secondary | ICD-10-CM

## 2019-03-25 DIAGNOSIS — R569 Unspecified convulsions: Secondary | ICD-10-CM

## 2019-03-25 DIAGNOSIS — F1090 Alcohol use, unspecified, uncomplicated: Secondary | ICD-10-CM

## 2019-03-25 DIAGNOSIS — E871 Hypo-osmolality and hyponatremia: Secondary | ICD-10-CM

## 2019-03-25 DIAGNOSIS — Z789 Other specified health status: Secondary | ICD-10-CM

## 2019-03-25 DIAGNOSIS — D649 Anemia, unspecified: Secondary | ICD-10-CM

## 2019-03-25 DIAGNOSIS — R6889 Other general symptoms and signs: Secondary | ICD-10-CM

## 2019-03-27 ENCOUNTER — Telehealth: Payer: Self-pay | Admitting: Neurology

## 2019-03-27 ENCOUNTER — Ambulatory Visit: Payer: Medicare Other | Admitting: Orthopaedic Surgery

## 2019-03-27 ENCOUNTER — Encounter: Payer: Self-pay | Admitting: Orthopaedic Surgery

## 2019-03-27 ENCOUNTER — Other Ambulatory Visit: Payer: Self-pay

## 2019-03-27 VITALS — Ht 62.0 in | Wt 146.0 lb

## 2019-03-27 DIAGNOSIS — M545 Low back pain, unspecified: Secondary | ICD-10-CM

## 2019-03-27 DIAGNOSIS — M7542 Impingement syndrome of left shoulder: Secondary | ICD-10-CM | POA: Diagnosis not present

## 2019-03-27 DIAGNOSIS — G8929 Other chronic pain: Secondary | ICD-10-CM

## 2019-03-27 MED ORDER — DIAZEPAM 10 MG PO TABS: ORAL_TABLET | ORAL | 0 refills | Status: DC

## 2019-03-27 NOTE — Telephone Encounter (Signed)
Patient called and stated she is returning a call from [place] about results.

## 2019-03-27 NOTE — Progress Notes (Signed)
Office Visit Note   Patient: Mindy Young           Date of Birth: 29-May-1945           MRN: [id number] Visit Date: 03/27/2019              Requested by: Lennie Odor, PA [address] [place],  [place] [address] PCP: Lennie Odor, PA   Assessment & Plan: Visit Diagnoses:  1. Acute bilateral low back pain without sciatica   2. Chronic low back pain, unspecified back pain laterality, unspecified whether sciatica present   3. Impingement syndrome of left shoulder     Plan: [name] returns for evaluation of multiple joint complaints and specifically her lumbar spine and left shoulder.  Is a brief summary of her history she had fallen several months ago and sustained a pelvic fracture that subsequently resolved with x-rays demonstrated good position of the fractures and callus.  She has had at least 3 falls in the last month or 2 and is had left shoulder and low back pain.  She was having more trouble with her shoulder and we obtained an MRI scan.  There was some degeneration and thinning of the anterior aspect of the distal supraspinatus tendon without a discrete tear and some degeneration of the subscapularis tendon without a tear.  The infraspinatus and teres minor were intact.  Slight atrophy of the supraspinatus muscle.  Biceps long head intact.  Has had prior resection of the distal clavicle without any complications and a type I acromion.  No chondral defects in the glenohumeral joint.  Long discussion regarding the diagnosis.  She is feeling better will work on some exercises.  The other issue is with her lumbar spine which has been "hurting" since she had 1 of those subsequent falls.  Pain is mostly in her back but interferes with her activities.  X-rays demonstrated some arthritis particularly at L5-S1 with facet arthritis.  She might have an occult compression fracture.  I will order an MRI scan with Valium.  The big issue is with her chronic pain and use of pain  medicines.  I checked her chart and she has had over 400 analgesic medicines as a combination of tramadol and hydrocodone from several providers.  I have discussed the fact that she needs to limit how much she takes without a definitive diagnosis.  He does have history of alcohol and tobacco abuse and I am concerned about her taking long-term medicines without objective signs for need.  We discussed that at length.  The entire office visit was at least 45 minutes with 50% of the time in counseling.  Moment she is comfortable taking what medicine she has.  I had also suggested she can check with her family physician or we could refer her to a pain clinic as I am concerned that over time she still may have need.  We will await the results of the MRI scan since I last saw her she has had over 100 prescribed hydrocodone through other providers and has tried some cannabis which has really helped with her multiple site pain  Follow-Up Instructions: Return After MRI scan lumbar spine.   Orders:  Orders Placed This Encounter  Procedures  . MR Lumbar Spine w/o contrast   Meds ordered this encounter  Medications  . diazepam (VALIUM) 10 MG tablet    Sig: Take tablet one hour prior to MRI scan.    Dispense:  1 tablet  Refill:  0      Procedures: No procedures performed   Clinical Data: No additional findings.   Subjective: Chief Complaint  Patient presents with  . Left Shoulder - Follow-up    MRI results  Patient presents today for follow up on her left shoulder pain. She had an MRI on 03/25/2019 and is here today to discuss her results. Patient states that she hurts in every bone in her body.   HPI  Review of Systems   Objective: Vital Signs: Ht 5\' 2"  (1.575 m)   Wt 146 lb (66.2 kg)   BMI 26.70 kg/m   Physical Exam Constitutional:      Appearance: She is well-developed.  Eyes:     Pupils: Pupils are equal, round, and reactive to light.  Pulmonary:     Effort: Pulmonary effort  is normal.  Skin:    General: Skin is warm and dry.  Neurological:     Mental Status: She is alert and oriented to person, place, and time.  Psychiatric:        Behavior: Behavior normal.     Ortho Exam awake alert and oriented x3.  Comfortable sitting straight leg raise negative.  No pain with range of motion of her right hip consistent with her healing pelvic fractures.  Still has a little impingement on the left shoulder but able to raise her arm over her head.  Good grip and good release.  Has history of seizures and does use a rolling walker and has a service dog.  Some percussible tenderness in multiple areas of her lumbar spine  Specialty Comments:  No specialty comments available.  Imaging: No results found.   PMFS History: Patient Active Problem List   Diagnosis Date Noted  . Impingement syndrome of left shoulder 03/27/2019  . Pain in left shoulder 02/21/2019  . Rib pain on right side 02/21/2019  . Low back pain 02/07/2019  . GERD (gastroesophageal reflux disease) 12/09/2018  . Anxiety 12/09/2018  . Difficulty speaking 12/09/2018  . Leukocytosis 12/09/2018  . Seizure-like activity ([place]) 12/09/2018  . Pelvic fracture ([place]) 11/25/2018  . Pubic bone fracture ([place]) 11/24/2018  . Left knee pain 03/29/2018  . Unilateral primary osteoarthritis, left knee 03/29/2018  . Genetic testing 01/24/2018  . Family history of breast cancer   . Family history of prostate cancer   . Malignant neoplasm of lower-inner quadrant of right breast of female, estrogen receptor positive ([place]) 10/24/2017  . Anemia 06/29/2011  . Hypertension   . Hyponatremia 06/25/2011  . Transaminitis 06/25/2011  . Alcohol abuse 06/25/2011  . Cough 06/25/2011  . Cigarette smoker 06/25/2011   Past Medical History:  Diagnosis Date  . Allergy   . Anemia 06/29/2011  . Anxiety   . Arthritis   . Breast cancer ([place])    right breast  . C1 cervical fracture ([place]) 02/1997  . Cancer ([place])    melanoma  .  Depression   . Family history of breast cancer   . Family history of prostate cancer   . GERD (gastroesophageal reflux disease)   . Hypertension   . Tardive dyskinesia     Family History  Problem Relation Age of Onset  . COPD Mother   . Prostate cancer Father 64       seed implant for treatment  . Colon polyps Father        'a few'  . Breast cancer Sister 46  . Breast cancer Maternal Aunt  dx >50  . Breast cancer Other 35       bilateral  . Colon cancer Neg Hx     Past Surgical History:  Procedure Laterality Date  . ABDOMINAL HYSTERECTOMY    . ASPIRATION OF ABSCESS Right 11/20/2017   Procedure: ASPIRATION OF RIGHT AXILLARY SEROMA;  Surgeon: Rolm Bookbinder, MD;  Location: [facility];  Service: General;  Laterality: Right;  . AUGMENTATION MAMMAPLASTY Bilateral    biateral implants , approx 2015  . BREAST LUMPECTOMY Right 11/02/2017   re-ex 11-20-17  . BREAST LUMPECTOMY WITH RADIOACTIVE SEED AND SENTINEL LYMPH NODE BIOPSY Right 11/02/2017   Procedure: BREAST LUMPECTOMY WITH RADIOACTIVE SEED AND SENTINEL LYMPH NODE BIOPSY;  Surgeon: Rolm Bookbinder, MD;  Location: [facility];  Service: General;  Laterality: Right;  . CHOLECYSTECTOMY    . collarbone    . INCONTINENCE SURGERY    . KNEE SURGERY     removal of cyst, repair of cartiledge  . LAPAROSCOPY     for endometriosis  . RE-EXCISION OF BREAST LUMPECTOMY Right 11/20/2017   Procedure: RE-EXCISION OF RIGHT BREAST MARGINS;  Surgeon: Rolm Bookbinder, MD;  Location: [facility];  Service: General;  Laterality: Right;  . ROTATOR CUFF REPAIR     left   Social History   Occupational History  . Not on file  Tobacco Use  . Smoking status: Current Every Day Smoker    Packs/day: 1.00    Types: Cigarettes  . Smokeless tobacco: Never Used  . Tobacco comment: e-cigs  Substance and Sexual Activity  . Alcohol use: Yes    Alcohol/week: 21.0 standard drinks    Types: 21  Standard drinks or equivalent per week    Comment: 2 per weekend day; denies during the week (not consistent with prior hx)  . Drug use: No  . Sexual activity: Not on file

## 2019-03-27 NOTE — Procedures (Signed)
TECHNICAL SUMMARY:  A multichannel referential and bipolar montage EEG using the standard international 10-20 system was performed on the patient described as awake, drowsy and asleep.  The dominant background activity consists of normal tone hertz activity seen most prominantly over the posterior head region.  The backgound activity is reactive to eye opening and closing procedures.  Excess amounts of fast (beta) activity are seen throughout the recording.  ACTIVATION:  Stepwise photic stimulation at 4-20 flashes per second was performed and did not elicit any abnormal waveforms but did produce a symmetric driving response.  Hyperventilation was not performed.  EPILEPTIFORM ACTIVITY:  There were no spikes, sharp waves or paroxysmal activity.  SLEEP: Stage I and stage II sleep architecture were identified.  CARDIAC:    IMPRESSION:  This EEG demonstrated no focal, hemispheric, or lateralizing features.  There was an excessive amount of fast (beta) activity throughout the recording, which is generally seen as a consequence of medication effect, such as benzodiazepines.  No epileptiform activity was recorded.  Correlate clinically.

## 2019-03-27 NOTE — Telephone Encounter (Signed)
Pt has declined ambulatory EEG.  Talked with my partner, who is epileptologist about her.  Pt only taking keppra 1000mg  daily.  She felt probably okay to do that but to continue pt on the medication (may do 500 mg bid in future but pt compliance may be less).  Stated that we can repeat EEG in 6 months again and then if okay can consider d/c.  Dr. Delice Lesch spent time in researching case as well.  Input appreciated.

## 2019-03-30 ENCOUNTER — Other Ambulatory Visit: Payer: Self-pay | Admitting: Hematology and Oncology

## 2019-04-04 ENCOUNTER — Telehealth: Payer: Self-pay | Admitting: Neurology

## 2019-04-04 NOTE — Telephone Encounter (Signed)
Not on most recent DPR can not speak to [employer].

## 2019-04-04 NOTE — Telephone Encounter (Signed)
Mindy Young left msg with after hours stating she needs to f/u with family members (patient's) medication. Thanks!

## 2019-04-05 ENCOUNTER — Telehealth: Payer: Self-pay | Admitting: Neurology

## 2019-04-05 NOTE — Telephone Encounter (Signed)
That medication is not filled at this office

## 2019-04-05 NOTE — Telephone Encounter (Signed)
Caller left msg with after hours that her friend needs her Levetiracetam medication 500mg  sent to [facility]. Thanks!

## 2019-04-09 ENCOUNTER — Inpatient Hospital Stay: Payer: Medicare Other | Attending: Hematology and Oncology | Admitting: Hematology and Oncology

## 2019-04-09 NOTE — Assessment & Plan Note (Deleted)
11/03/2017:Right lumpectomy: IDC grade 1, 0.6 cm, with Ig DCIS, margins negative, 0/4 lymph nodes negative,ER 100%, PR 100%, HER-2 negative, Ki-67 15%, T1b N0 stage Ia Based on favorable prognostic Factors, radiation was omitted.  Current treatment: Letrozole started November 2019 Letrozole toxicities: Denies any major adverse effects to letrozole therapy.  Breast cancer surveillance: 1.  Breast exam 04/09/2019: Benign 2.  Mammogram 10/30/2018: Benign breast density category B  Return to clinic in 1 year for follow-up

## 2019-04-10 ENCOUNTER — Telehealth: Payer: Self-pay | Admitting: Neurology

## 2019-04-10 NOTE — Telephone Encounter (Signed)
Please let pt know that while that is her decision, it is not a decision that I agree with and multiple seizures are what landed her in the hospital previously.

## 2019-04-10 NOTE — Telephone Encounter (Signed)
Make sure that caller is on DPR.   Pt was never told not to take her Keppra.  Pt decreased her keppra on her own to 1000mg  daily (from 1000mg  bid) and I told her to just stay on that.

## 2019-04-10 NOTE — Telephone Encounter (Signed)
Patient is on Vacation and told Mindy Young that Dr tat told her not to the seizure medication anymore. She was not sure of the name of the medication. Since the patient did not take it with her. She wants to make sure that is correct because patient is not acting right  She would like a call back today

## 2019-04-10 NOTE — Telephone Encounter (Signed)
Brace yourself. Received call from Miquel Dunn, nurse who takes care of patient and on DPR. She states patient is not doing well. She states patient is drinking and driving [id number] on her way to [place]. She is very concerned.  She states the patients family flew her to [place] and the family is with her and they are concerned as well. 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 She states patient is manipulating everyone. She states when patient takes the Keppra bid she is awful, when she takes 1 tab qd she is great and when she doesn't take any meds she is awful. She is requesting we send a rx to a pharmacy in [place] so the patient can take some medicine and calm down.Nurse states you can call her if you like. She is requesting we contact her instead of the patient because the patient lies to [place].   I did give Gilmore Laroche your recommendations about patient not stopping the Keppra and she stated the patient told her you said it was ok to stop the Keppra.   Please advise!

## 2019-04-10 NOTE — Telephone Encounter (Signed)
Pateint she states she has not taken any Keppra since Monday of last week and she is fine nor does not feel bad. She states she doesn't think she needs this medicine.

## 2019-04-11 NOTE — Telephone Encounter (Signed)
Pt has plenty of medication to get her through and they can transfer RX to [place].  Pt unfortunately has a long history of alcoholism, which is far out of my area of expertise.  Judson Roch, anything further that we can advise here?  She has been given good medical advise already but this is a tough disease.

## 2019-04-12 ENCOUNTER — Telehealth: Payer: Self-pay | Admitting: Neurology

## 2019-04-12 MED ORDER — LEVETIRACETAM 1000 MG PO TABS: 1000.0000 mg | ORAL_TABLET | Freq: Two times a day (BID) | ORAL | 1 refills | Status: DC

## 2019-04-12 NOTE — Telephone Encounter (Signed)
Rx(s) sent to pharmacy electronically.  Gilmore Laroche has been notified and voiced understanding that the rx has been sent over to the pharmacy.

## 2019-04-12 NOTE — Telephone Encounter (Signed)
Called pharmacy and requested that they fill the [place] rx.   Contacted Tricia, per dpr, and made her aware that I contacted the pharmacy and requested they fill the patients Keppra rx. She voiced understanding and thanked me for calling.

## 2019-04-12 NOTE — Telephone Encounter (Signed)
Okay to send Keppra, 1000mg  daily.  #90, RF 1

## 2019-04-12 NOTE — Telephone Encounter (Signed)
Left message on VM   The pharmacy in [place] has not gotten in refill for patient please call her

## 2019-04-12 NOTE — Telephone Encounter (Signed)
How difficult this sounds. The family might benefit from supports like legal care planning education (process for determining competency, POAs, etc even if pt is competent now she may not always be and its good to know those resources ahead of time), Al-Anon groups to help them manage the impact that alcoholism can have on themselves and navigate their relationships with her, and if pt is driving and driving, they should call the police...is there a specific family member I could call to discuss?

## 2019-04-12 NOTE — Telephone Encounter (Signed)
Patient's daughter called back and reported the local [facility] on [address] in [place] does not have any refills on the patient's medications. She is not sure what to do.   Please confirm the most recent pharmacy refills were sent to.

## 2019-04-12 NOTE — Telephone Encounter (Signed)
I spoke w pt's nurse Gilmore Laroche to provide some guidance on possible resources. Gilmore Laroche reports that she thinks pt is in a state of crisis, recently divorced and "it's on her bucket list to drain his account and embarrass him", "so she flew to [facility] for what was suppose to be a vacation but then she wouldn't come home so the family flew me down here to get her", "I told her she at least needs to take her [place] so that she doesn't have a seizure". Nurse reports pt told her that she would take Keppra but that Dr Tat wouldn't refill it. We discussed location for temp refill-goal is for pt to return to [place].  [facility] [address]  I discussed Al-Anon as a way to determine local crisis resources, potential availability of crisis-intervention trained police officers and to call police if pt is driving intoxicated, nurse said family is already working with attorney re legal planning and is aware of Al-Anon groups, states pt "daughter is about to go to rehab". Discussed how substance use is a disease requiring it's own care team and encouraged that goal.

## 2019-05-10 ENCOUNTER — Telehealth: Payer: Self-pay | Admitting: Neurology

## 2019-05-10 NOTE — Telephone Encounter (Signed)
Noting that pt cancelled appt for monday

## 2019-05-13 ENCOUNTER — Ambulatory Visit: Payer: Medicare Other | Admitting: Neurology

## 2019-05-14 ENCOUNTER — Other Ambulatory Visit: Payer: Medicare Other

## 2019-05-16 ENCOUNTER — Ambulatory Visit: Payer: Medicare Other | Admitting: Orthopaedic Surgery

## 2019-06-28 ENCOUNTER — Other Ambulatory Visit: Payer: Self-pay | Admitting: Hematology and Oncology

## 2019-07-01 ENCOUNTER — Telehealth: Payer: Self-pay

## 2019-07-01 NOTE — Telephone Encounter (Signed)
Patient called stating that she needs a call back from Dr. Durward Fortes before her appointment on Wednesday, 07/03/2019. CB# [phone].  Please advise.  Thank you.

## 2019-07-02 ENCOUNTER — Telehealth: Payer: Self-pay | Admitting: Orthopaedic Surgery

## 2019-07-02 ENCOUNTER — Other Ambulatory Visit: Payer: Self-pay | Admitting: Orthopaedic Surgery

## 2019-07-02 MED ORDER — HYDROCODONE-ACETAMINOPHEN 5-325 MG PO TABS: 1.0000 | ORAL_TABLET | Freq: Four times a day (QID) | ORAL | 0 refills | Status: DC | PRN

## 2019-07-02 NOTE — Telephone Encounter (Signed)
Patient wants you to call her before tomorrow's appointment.

## 2019-07-02 NOTE — Telephone Encounter (Signed)
called

## 2019-07-02 NOTE — Telephone Encounter (Signed)
Another message

## 2019-07-02 NOTE — Telephone Encounter (Signed)
Called-will see tomorrow. Rx hydrocodone for pain

## 2019-07-02 NOTE — Telephone Encounter (Signed)
Patient called requesting call Dr. Durward Fortes before appointment tomorrow. Patient states it's urgent. Patient phone number is [phone].

## 2019-07-03 ENCOUNTER — Ambulatory Visit (INDEPENDENT_AMBULATORY_CARE_PROVIDER_SITE_OTHER): Payer: Medicare Other | Admitting: Orthopaedic Surgery

## 2019-07-03 ENCOUNTER — Emergency Department (HOSPITAL_BASED_OUTPATIENT_CLINIC_OR_DEPARTMENT_OTHER)
Admission: EM | Admit: 2019-07-03 | Discharge: 2019-07-04 | Disposition: A | Discharge status: Home / Self Care | Payer: Medicare Other | Attending: Emergency Medicine | Admitting: Emergency Medicine

## 2019-07-03 ENCOUNTER — Encounter: Payer: Self-pay | Admitting: Orthopaedic Surgery

## 2019-07-03 ENCOUNTER — Other Ambulatory Visit: Payer: Self-pay

## 2019-07-03 ENCOUNTER — Emergency Department (HOSPITAL_BASED_OUTPATIENT_CLINIC_OR_DEPARTMENT_OTHER): Payer: Medicare Other

## 2019-07-03 ENCOUNTER — Encounter (HOSPITAL_BASED_OUTPATIENT_CLINIC_OR_DEPARTMENT_OTHER): Payer: Self-pay

## 2019-07-03 DIAGNOSIS — G8929 Other chronic pain: Secondary | ICD-10-CM

## 2019-07-03 DIAGNOSIS — Z853 Personal history of malignant neoplasm of breast: Secondary | ICD-10-CM | POA: Diagnosis not present

## 2019-07-03 DIAGNOSIS — F17210 Nicotine dependence, cigarettes, uncomplicated: Secondary | ICD-10-CM | POA: Diagnosis not present

## 2019-07-03 DIAGNOSIS — Z79899 Other long term (current) drug therapy: Secondary | ICD-10-CM | POA: Diagnosis not present

## 2019-07-03 DIAGNOSIS — M791 Myalgia, unspecified site: Secondary | ICD-10-CM

## 2019-07-03 DIAGNOSIS — M545 Low back pain, unspecified: Secondary | ICD-10-CM

## 2019-07-03 DIAGNOSIS — Z885 Allergy status to narcotic agent status: Secondary | ICD-10-CM | POA: Insufficient documentation

## 2019-07-03 DIAGNOSIS — I10 Essential (primary) hypertension: Secondary | ICD-10-CM | POA: Diagnosis not present

## 2019-07-03 DIAGNOSIS — M7918 Myalgia, other site: Secondary | ICD-10-CM | POA: Insufficient documentation

## 2019-07-03 DIAGNOSIS — R05 Cough: Secondary | ICD-10-CM | POA: Diagnosis present

## 2019-07-03 DIAGNOSIS — R52 Pain, unspecified: Secondary | ICD-10-CM

## 2019-07-03 HISTORY — DX: Alcohol abuse, uncomplicated: F10.10

## 2019-07-03 MED ORDER — HYDROCODONE-ACETAMINOPHEN 5-325 MG PO TABS
1.0000 | ORAL_TABLET | Freq: Once | ORAL | Status: AC
  Administered 2019-07-03: 1 via ORAL
  Filled 2019-07-03: qty 1

## 2019-07-03 NOTE — Progress Notes (Signed)
Office Visit Note   Patient: Mindy Young           Date of Birth: 1946-02-03           MRN: [id number] Visit Date: 07/03/2019              Requested by: Lennie Odor, PA [address] [place],  [place] [address] PCP: Lennie Odor, PA   Assessment & Plan: Visit Diagnoses:  1. Chronic low back pain, unspecified back pain laterality, unspecified whether sciatica present     Plan: Chronic "total body" pain it seems to be localized more to the lumbar spine and both hips.  After much discussion we will order an MRI scan of the lumbar spine.  I discussed at length her present problem on the phone yesterday and again in the office today which I will outline below.  Hydrocodone for pain.  I think there is a reasonable chance that her lumbar spine is causing a lot of the pain into her hips.  Films of her lumbar spine within the last several months demonstrated significant arthritis with areas of listhesis.  She might very well have stenosis Follow-Up Instructions: Return After MRI scan lumbar spine.   Orders:  Orders Placed This Encounter  Procedures  . MR Lumbar Spine w/o contrast   No orders of the defined types were placed in this encounter.     Procedures: No procedures performed   Clinical Data: No additional findings.   Subjective: Chief Complaint  Patient presents with  . Lower Back - Pain  Patient presents today for lower back pain. She was last here and evaluated in February for her back pain.  Mrs. Albani called yesterday to relate the problem she is having presently with her "pain".  She recently is divorced and has moved part-time to [employer] [place] [employer] and plans to spend her time between [facility] in [place] maintaining her home in [place].  She notes that she is probably fallen more than 20 times over the last several months and is injured multiple "parts of my body".  I saw her several months ago and obtained an MRI scan of her left shoulder  that did not reveal any significant pathology.  We also obtained films of her lumbar spine revealing considerable degenerative changes and some areas of anterior listhesis.  She also has been treated for pubic rami fracture over the last 6 months which appear to have healed.  She has an issue with seizures and does have a service dog and uses a rolling walker.  I talked to her for almost 20 minutes yesterday on the phone regarding all of the above and suggested that she come to the office today.  She notes that she has been taking an aspirin a day for the past 2 weeks and has had a lot of bruising.  This was suggested by her "nervous".  She is having some issues with her shoulder and her back and even her hips.  She has not had any bowel or bladder changes and neurologically appears to be intact.  He has been followed by a neurosurgeon for her neck and the neurologist for her seizures.  She has a history of breast cancer and is being followed by Dr. Geraldine Solar gynecologist.  She has not taken any pain medicines recently so I called in hydrocodone yesterday as she was so "miserable". Additional history is that because of her recent fall she has developed "pneumonia" treated by  her family physician in [place].  She plans to follow-up with him in the very near future.  HPI  Review of Systems   Objective: Vital Signs: There were no vitals taken for this visit.  Physical Exam Constitutional:      Appearance: She is well-developed.  Eyes:     Pupils: Pupils are equal, round, and reactive to light.  Pulmonary:     Effort: Pulmonary effort is normal.  Skin:    General: Skin is warm and dry.  Neurological:     Mental Status: She is alert and oriented to person, place, and time.  Psychiatric:        Behavior: Behavior normal.     Ortho Exam awake alert and oriented x3.  No obvious  acute distress. accompanied by her daughter and her service dog.  She was able to walk without a limp.  She did  not have any pain with range of motion of either hip.  She has considerable bruising of both of her legs below her knees related to her recent falls and some mild swelling of her ankles and her feet.  Motor exam appeared to be intact.  She did have percussible tenderness of the lumbar spine.  No pain around either hip laterally.  She had some mild discomfort with range of motion of her left shoulder but was able to place it overhead and abduct without much trouble.  Again, she had an MRI scan several months ago that did not reveal any significant pathology.  Her neck was quite stiff with limited motion in flexion extension and rotation and this has been addressed through her neurologist and her neurosurgeon  Specialty Comments:  No specialty comments available.  Imaging: No results found.   PMFS History: Patient Active Problem List   Diagnosis Date Noted  . Impingement syndrome of left shoulder 03/27/2019  . Pain in left shoulder 02/21/2019  . Rib pain on right side 02/21/2019  . Low back pain 02/07/2019  . GERD (gastroesophageal reflux disease) 12/09/2018  . Anxiety 12/09/2018  . Difficulty speaking 12/09/2018  . Leukocytosis 12/09/2018  . Seizure-like activity ([place]) 12/09/2018  . Pelvic fracture ([place]) 11/25/2018  . Pubic bone fracture ([place]) 11/24/2018  . Left knee pain 03/29/2018  . Unilateral primary osteoarthritis, left knee 03/29/2018  . Genetic testing 01/24/2018  . Family history of breast cancer   . Family history of prostate cancer   . Malignant neoplasm of lower-inner quadrant of right breast of female, estrogen receptor positive ([place]) 10/24/2017  . Anemia 06/29/2011  . Hypertension   . Hyponatremia 06/25/2011  . Transaminitis 06/25/2011  . Alcohol abuse 06/25/2011  . Cough 06/25/2011  . Cigarette smoker 06/25/2011   Past Medical History:  Diagnosis Date  . Allergy   . Anemia 06/29/2011  . Anxiety   . Arthritis   . Breast cancer ([place])    right breast  . C1  cervical fracture ([place]) 02/1997  . Cancer ([place])    melanoma  . Depression   . Family history of breast cancer   . Family history of prostate cancer   . GERD (gastroesophageal reflux disease)   . Hypertension   . Tardive dyskinesia     Family History  Problem Relation Age of Onset  . COPD Mother   . Prostate cancer Father 28       seed implant for treatment  . Colon polyps Father        'a few'  . Breast cancer Sister  2  . Breast cancer Maternal Aunt        dx >50  . Breast cancer Other 35       bilateral  . Colon cancer Neg Hx     Past Surgical History:  Procedure Laterality Date  . ABDOMINAL HYSTERECTOMY    . ASPIRATION OF ABSCESS Right 11/20/2017   Procedure: ASPIRATION OF RIGHT AXILLARY SEROMA;  Surgeon: Rolm Bookbinder, MD;  Location: [facility];  Service: General;  Laterality: Right;  . AUGMENTATION MAMMAPLASTY Bilateral    biateral implants , approx 2015  . BREAST LUMPECTOMY Right 11/02/2017   re-ex 11-20-17  . BREAST LUMPECTOMY WITH RADIOACTIVE SEED AND SENTINEL LYMPH NODE BIOPSY Right 11/02/2017   Procedure: BREAST LUMPECTOMY WITH RADIOACTIVE SEED AND SENTINEL LYMPH NODE BIOPSY;  Surgeon: Rolm Bookbinder, MD;  Location: [facility];  Service: General;  Laterality: Right;  . CHOLECYSTECTOMY    . collarbone    . INCONTINENCE SURGERY    . KNEE SURGERY     removal of cyst, repair of cartiledge  . LAPAROSCOPY     for endometriosis  . RE-EXCISION OF BREAST LUMPECTOMY Right 11/20/2017   Procedure: RE-EXCISION OF RIGHT BREAST MARGINS;  Surgeon: Rolm Bookbinder, MD;  Location: [facility];  Service: General;  Laterality: Right;  . ROTATOR CUFF REPAIR     left   Social History   Occupational History  . Not on file  Tobacco Use  . Smoking status: Current Every Day Smoker    Packs/day: 1.00    Types: Cigarettes  . Smokeless tobacco: Never Used  . Tobacco comment: e-cigs  Substance and Sexual Activity  . Alcohol  use: Yes    Alcohol/week: 21.0 standard drinks    Types: 21 Standard drinks or equivalent per week    Comment: 2 per weekend day; denies during the week (not consistent with prior hx)  . Drug use: No  . Sexual activity: Not on file

## 2019-07-03 NOTE — ED Triage Notes (Signed)
Pt states she was dx with "pneumonia, collapsed lungs and fx ribs" in March-states she was treated with abx, steriods and pain med-pt states she still has a cough and "is hurting all over"-to tx area via w/c
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
## 2019-07-04 ENCOUNTER — Telehealth: Payer: Self-pay | Admitting: Orthopaedic Surgery

## 2019-07-04 NOTE — Telephone Encounter (Signed)
Patient called requesting a call back from Dr. Durward Fortes. Patient states it is urgent and had 4 missed calls from Him. Message will be sent as high priority. Patient phone number is [phone].

## 2019-07-05 ENCOUNTER — Telehealth: Payer: Self-pay | Admitting: Orthopaedic Surgery

## 2019-07-05 NOTE — Telephone Encounter (Signed)
See other note forwarded to Dr Durward Fortes Waiting to be advised.

## 2019-07-05 NOTE — Telephone Encounter (Signed)
See below. Please advise. Thanks.  

## 2019-07-05 NOTE — Telephone Encounter (Signed)
Pt called stating she had a fall and went to hospital where they took x-rays of her pelvic area and was told that a bone hadn't fused. Pt wanted to make Dr. Durward Fortes aware and she also stated that she would need something to help when she goes for her MRI on 08/03/19.  [phone]

## 2019-07-08 ENCOUNTER — Telehealth: Payer: Self-pay | Admitting: Orthopaedic Surgery

## 2019-07-08 MED ORDER — DIAZEPAM 10 MG PO TABS: ORAL_TABLET | ORAL | 0 refills | Status: DC

## 2019-07-08 NOTE — Telephone Encounter (Signed)
Called patient. No answer. Left message that I spoke with Dr.Whitfield and he states that he will not be giving her anything stronger than the Hydrocodone that he prescribed on 07/02/2019.

## 2019-07-08 NOTE — Telephone Encounter (Signed)
Called to pharmacy 

## 2019-07-08 NOTE — Telephone Encounter (Signed)
I have messaged Dr.Whitfield and waiting on response from him.

## 2019-07-08 NOTE — Telephone Encounter (Signed)
Valium 10mg  po 30 min prior to exam

## 2019-07-08 NOTE — Telephone Encounter (Signed)
Patient called asked if she can get something called into her pharmacy for pain. Patient said she is out of pain medicine. Patient said she has been up for 15 days. Patient said she need something stronger than Vicodin. Patient said she just need some relief. Patient asked if Dr Durward Fortes will look at her ER visit.   Patient said her left knee is hurting pretty bad. The number to contact patient is [phone]

## 2019-07-10 ENCOUNTER — Telehealth: Payer: Self-pay | Admitting: Orthopaedic Surgery

## 2019-07-10 NOTE — Telephone Encounter (Signed)
Patient called and requesting a refill of hydrocodone. Please send to pharmacy on file. Patient complains that she has not slept in 19 days due to pain. Patient is asking for some sleep aide medication too. Patient phone number is [phone].

## 2019-07-10 NOTE — Telephone Encounter (Signed)
Please advise 

## 2019-07-11 ENCOUNTER — Other Ambulatory Visit: Payer: Self-pay | Admitting: Orthopaedic Surgery

## 2019-07-11 MED ORDER — HYDROCODONE-ACETAMINOPHEN 5-325 MG PO TABS: 1.0000 | ORAL_TABLET | Freq: Four times a day (QID) | ORAL | 0 refills | Status: DC | PRN

## 2019-07-11 NOTE — Telephone Encounter (Signed)
Needs to call primary care MD for sleep med. I will renew hydrocodone. Has so many medical issues I am concerned about prescribing a potentially dangerous sleep med

## 2019-07-11 NOTE — Telephone Encounter (Signed)
Spoke with patient. She has an appointment with her PCP tomorrow to discuss sleep aide and causes for why she is hurting from head to toe all over. She states that the ED doctor told her that she possibly has fibromyalgia.

## 2019-07-18 IMAGING — CT CT CERVICAL SPINE W/O CM
3 of 6 series · 13 of 33 positions shown, 15 images · non-contrast
Comparison: Head CT dated 10/23/2014

CLINICAL DATA: 72-year-old female with head trauma.

EXAM:
CT HEAD WITHOUT CONTRAST
CT CERVICAL SPINE WITHOUT CONTRAST
TECHNIQUE: Multidetector CT imaging of the head and cervical spine was
performed following the standard protocol without intravenous
contrast. Multiplanar CT image reconstructions of the cervical spine
were also generated.

[Series 4: head 3.0 mpr cor · coronal · 0.34mm/px · 2 of 67 slices shown]
[im 23/67  bone]
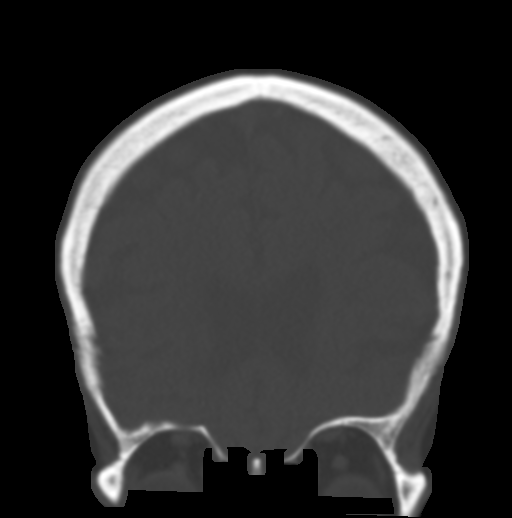
[im 45/67  bone]
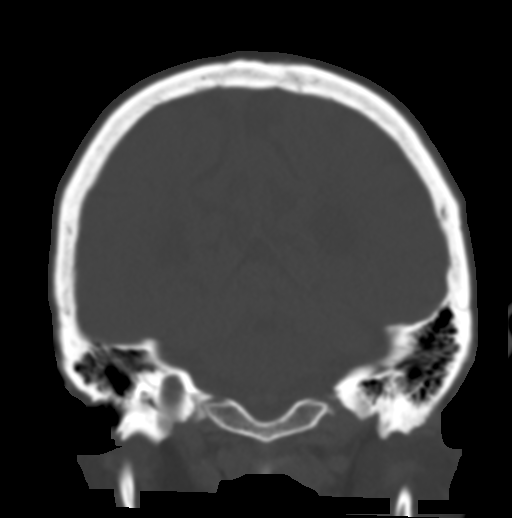

[Series 7: c_spine 2.0 i30s 3 · axial · 0.38mm/px · z∈[+741,+865]mm · 6 of 80 slices shown, 8 images]
[im 9/80  soft-tissue]
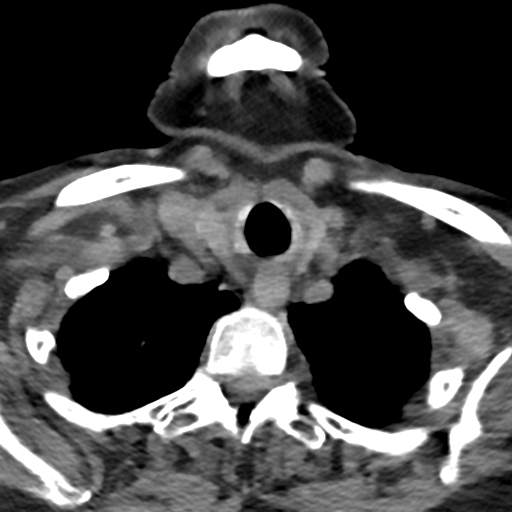
[im 9/80  bone]
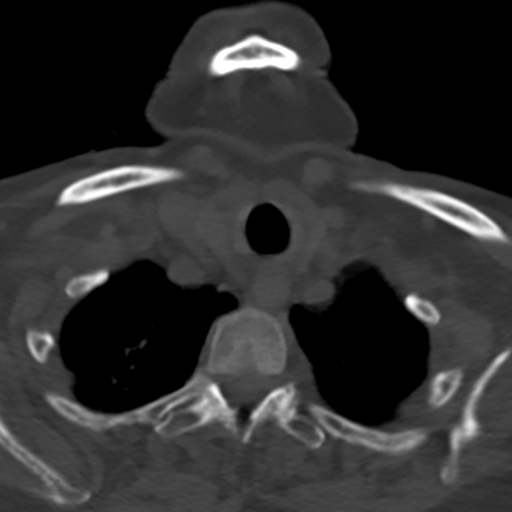
[im 27/80  bone]
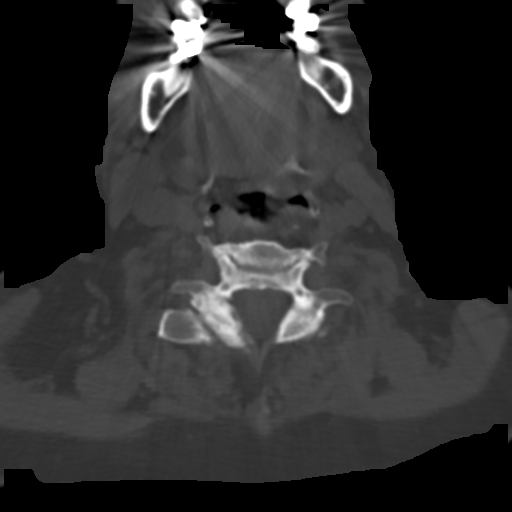
[im 36/80  bone]
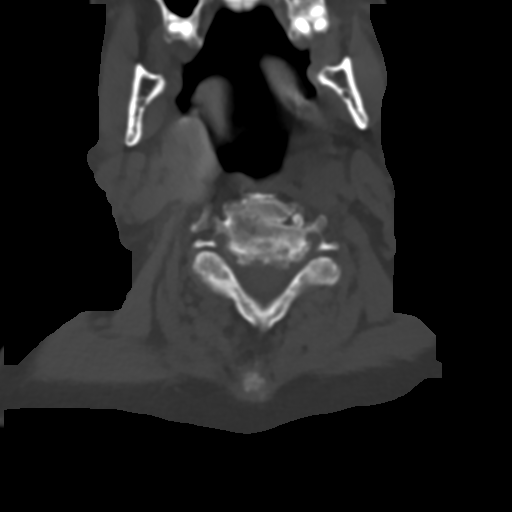
[im 44/80  bone]
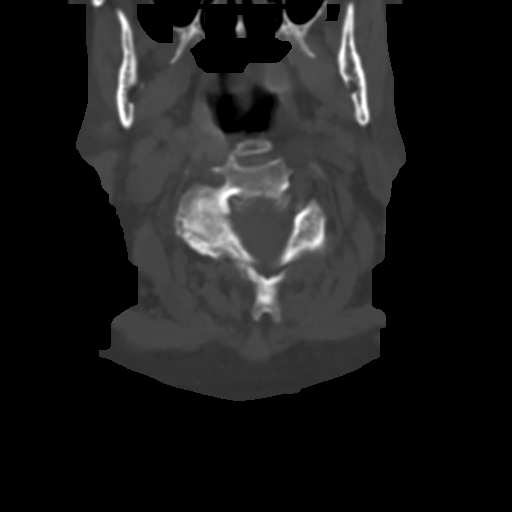
[im 62/80  soft-tissue]
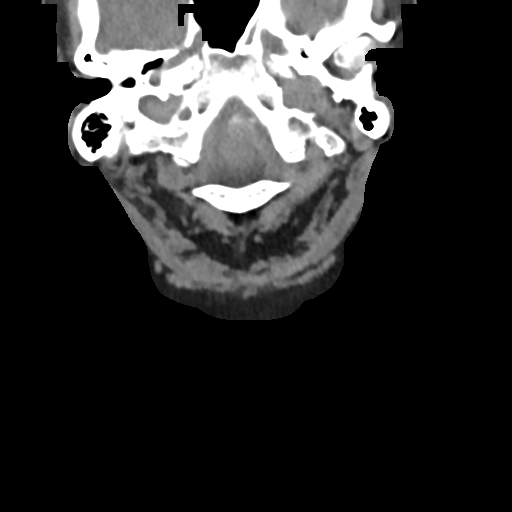
[im 62/80  bone]
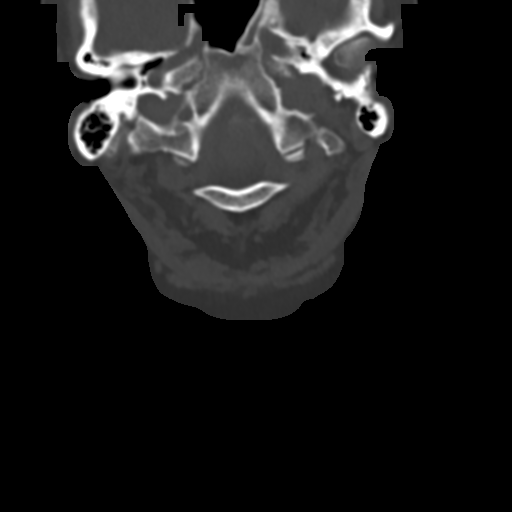
[im 71/80  bone]
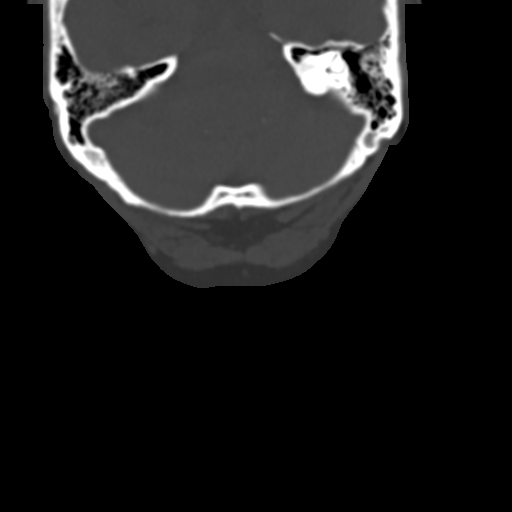

[Series 10: sagittals · sagittal · 0.25mm/px · 5 of 74 slices shown]
[im 13/74  bone]
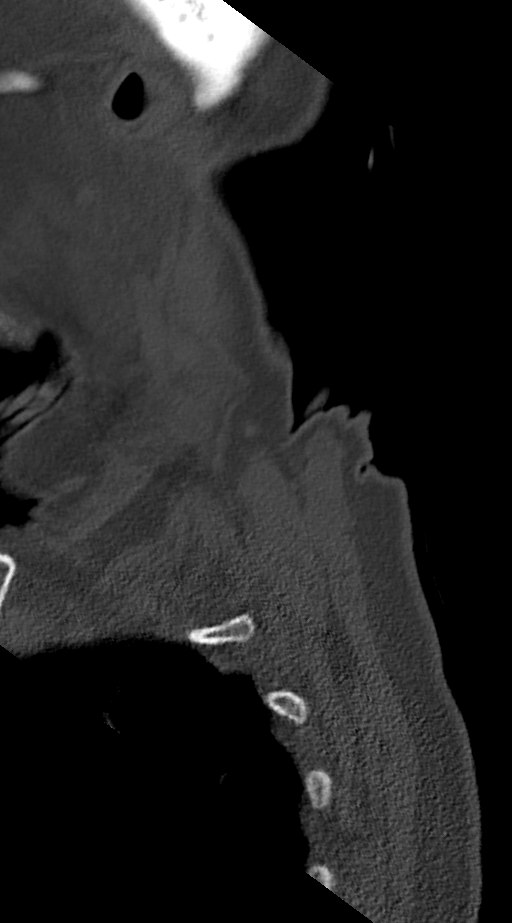
[im 25/74  bone]
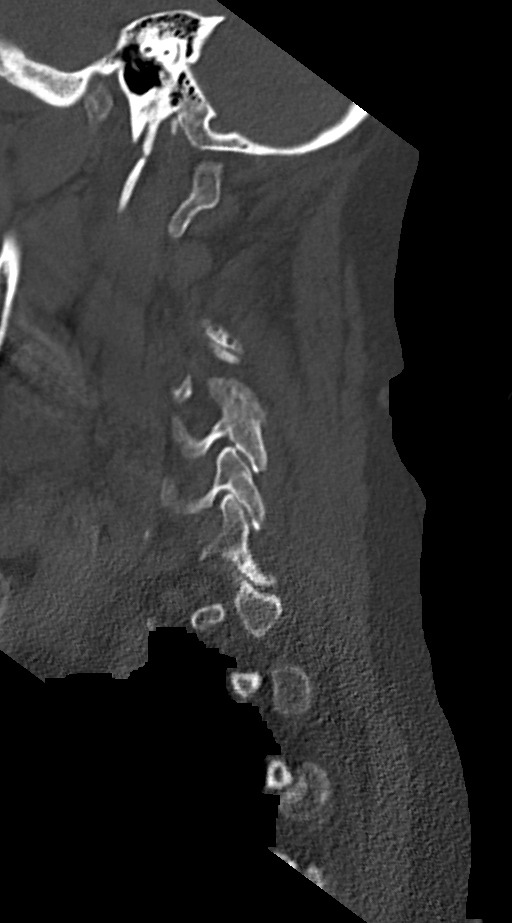
[im 37/74  bone]
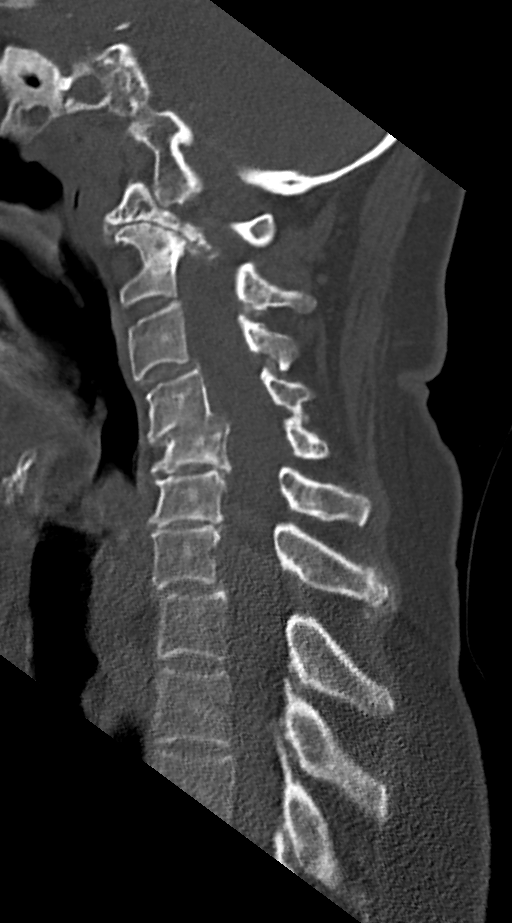
[im 49/74  bone]
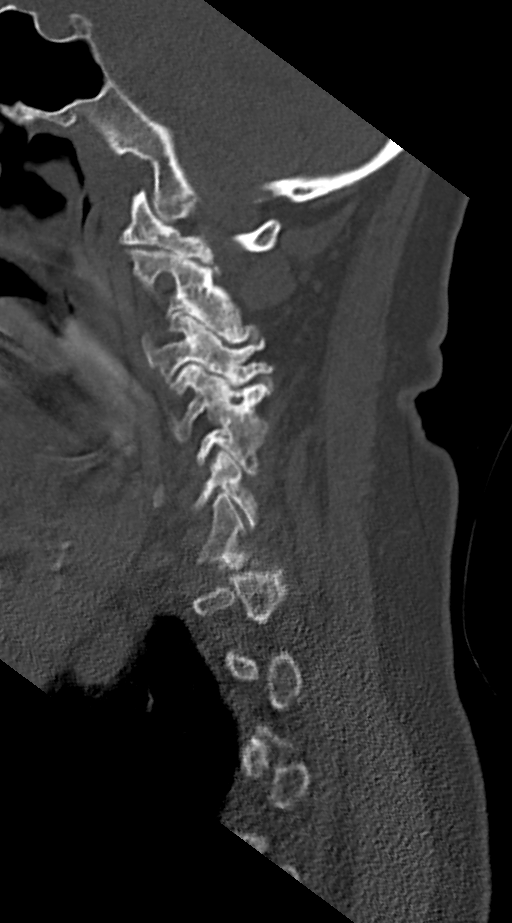
[im 61/74  bone]
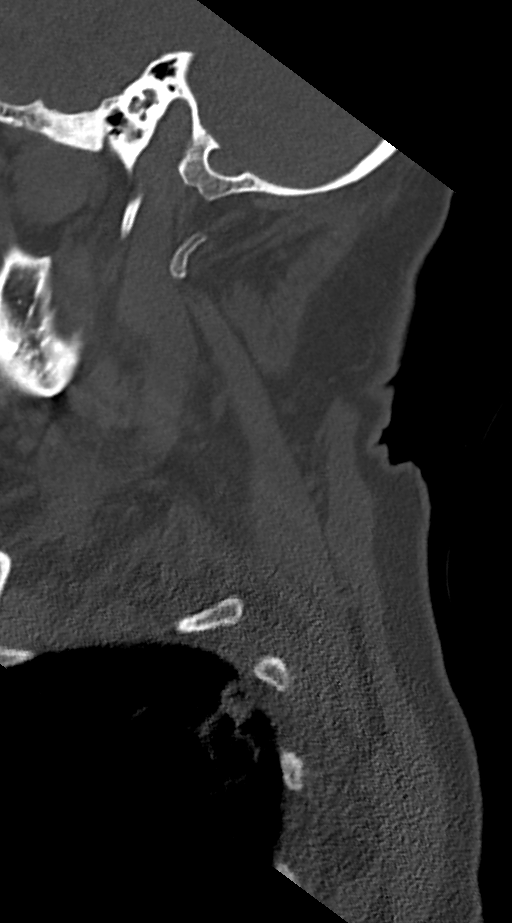

[13 of 33 positions shown; findings below may reference images not displayed]

FINDINGS: CT HEAD FINDINGS

Brain: There is mild to moderate age-related atrophy and chronic
microvascular ischemic changes. There is no acute intracranial
hemorrhage. No mass effect or midline shift. No extra-axial fluid
collection.

Vascular: No hyperdense vessel or unexpected calcification.

Skull: Normal. Negative for fracture or focal lesion.

Sinuses/Orbits: No acute finding.

Other: None

CT CERVICAL SPINE FINDINGS

Alignment: No acute subluxation. Reversal of normal cervical
lordosis at C4-C6, chronic. Grade 1 C4-C5 anterolisthesis.

Skull base and vertebrae: No acute fracture.

Soft tissues and spinal canal: No prevertebral fluid or swelling. No
visible canal hematoma.

Disc levels: Multilevel degenerative changes with endplate
irregularity and disc space narrowing. There is fusion of C4-C5.
Severe degenerative changes at C1-C2 lateral articulation as well as
multilevel facet hypertrophy.

Upper chest: Emphysema. A 1.3 x 1.7 cm pleural-based density in the
posterior right apex most likely scarring. This was likely present
on the CT of 2641. Nonemergent chest CT may provide better
evaluation of the lungs if clinically indicated.

Other: None
IMPRESSION: 1. No acute intracranial hemorrhage. Age-related atrophy and chronic
microvascular ischemic changes.
2. No acute/traumatic cervical spine pathology. Extensive
degenerative changes.

## 2019-07-24 ENCOUNTER — Other Ambulatory Visit: Payer: Self-pay | Admitting: Orthopaedic Surgery

## 2019-07-24 ENCOUNTER — Telehealth: Payer: Self-pay | Admitting: Orthopaedic Surgery

## 2019-07-24 MED ORDER — HYDROCODONE-ACETAMINOPHEN 5-325 MG PO TABS: 1.0000 | ORAL_TABLET | Freq: Four times a day (QID) | ORAL | 0 refills | Status: DC | PRN

## 2019-07-24 NOTE — Telephone Encounter (Signed)
Called- will renew hydrocodone

## 2019-07-24 NOTE — Telephone Encounter (Signed)
See below

## 2019-07-24 NOTE — Telephone Encounter (Signed)
Patient called request a refill of hydrocodone and asking for a call back from Dr. Durward Fortes. Patient states she didn't want to talk with the triage nurse about her pain. She wants to speak to Dr. Durward Fortes about left knee pains and pelvis pains. Patient states to be in pain 24 hours a day 7 days a week. Patient claims to have not slept over 20 plus days

## 2019-07-25 ENCOUNTER — Telehealth: Payer: Self-pay | Admitting: Orthopaedic Surgery

## 2019-07-25 ENCOUNTER — Other Ambulatory Visit: Payer: Self-pay | Admitting: Orthopedic Surgery

## 2019-07-25 ENCOUNTER — Telehealth: Payer: Self-pay

## 2019-07-25 MED ORDER — HYDROCODONE-ACETAMINOPHEN 5-325 MG PO TABS: 2.0000 | ORAL_TABLET | Freq: Four times a day (QID) | ORAL | 0 refills | Status: DC | PRN

## 2019-07-25 NOTE — Telephone Encounter (Signed)
Left detailed message advising patient of Dr Doristine Devoid recommendations. Advised patient to contact office with any questions or concerns.

## 2019-07-25 NOTE — Telephone Encounter (Signed)
Can you please go into her chart and refill her hydrocodone? Dr.Whitfield sent this yesterday and but apparently cannot be filled to his DEA is inactive. Thanks!

## 2019-07-25 NOTE — Telephone Encounter (Signed)
Pharmacy called.   The matter is not completely clear to me but they are trying to see if the prescription for the patient's Hydrocodone can be sent in by another provider. Says there is an issue with it being from [facility].   Call back: [phone]

## 2019-07-25 NOTE — Telephone Encounter (Signed)
Per Dr Tat patient needs to contact pcp have them do the referral for psych evaluation.   Spoke with patient she does not have a pcp and has been living in [place]. She states she is in [place] right now. She want to know if we can do the referral. She only has a orthopedic doctor and Dr Tat. Patient states she needs help and she is a mess.

## 2019-07-25 NOTE — Telephone Encounter (Signed)
Called and spoke with pharmacy. They state that the DEA number is inactive and will not fill prescription from Dr.Whitfield. It must be ordered another provider now, per pharmacy.
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
## 2019-07-25 NOTE — Telephone Encounter (Signed)
This is not within my area of expertise and I cannot help.  Usually psych does not require referrals.

## 2019-08-01 ENCOUNTER — Ambulatory Visit: Payer: Medicare Other | Admitting: Orthopedic Surgery

## 2019-08-03 ENCOUNTER — Other Ambulatory Visit: Payer: Self-pay

## 2019-08-03 ENCOUNTER — Ambulatory Visit
Admission: RE | Admit: 2019-08-03 | Discharge: 2019-08-03 | Disposition: A | Discharge status: Home / Self Care | Payer: Medicare Other | Source: Ambulatory Visit | Attending: Orthopaedic Surgery | Admitting: Orthopaedic Surgery

## 2019-08-03 DIAGNOSIS — G8929 Other chronic pain: Secondary | ICD-10-CM

## 2019-08-05 ENCOUNTER — Other Ambulatory Visit: Payer: Self-pay

## 2019-08-05 ENCOUNTER — Ambulatory Visit: Payer: Medicare Other | Admitting: Neurology

## 2019-08-05 DIAGNOSIS — G8929 Other chronic pain: Secondary | ICD-10-CM

## 2019-08-05 NOTE — Progress Notes (Signed)
Needs to see Dr Ernestina Patches for anESI

## 2019-08-06 ENCOUNTER — Other Ambulatory Visit: Payer: Self-pay | Admitting: Orthopaedic Surgery

## 2019-08-06 ENCOUNTER — Telehealth: Payer: Self-pay | Admitting: Orthopaedic Surgery

## 2019-08-06 ENCOUNTER — Other Ambulatory Visit: Payer: Self-pay | Admitting: Physical Medicine and Rehabilitation

## 2019-08-06 ENCOUNTER — Ambulatory Visit: Payer: Medicare Other | Admitting: Orthopaedic Surgery

## 2019-08-06 DIAGNOSIS — F411 Generalized anxiety disorder: Secondary | ICD-10-CM

## 2019-08-06 MED ORDER — HYDROCODONE-ACETAMINOPHEN 5-325 MG PO TABS: 1.0000 | ORAL_TABLET | Freq: Four times a day (QID) | ORAL | 0 refills | Status: DC | PRN

## 2019-08-06 MED ORDER — DIAZEPAM 5 MG PO TABS: ORAL_TABLET | ORAL | 0 refills | Status: DC

## 2019-08-06 NOTE — Telephone Encounter (Signed)
Patient called.   She needs a refill on her hydrocodone. She is also requesting a call back to explain why she is being referred to Dr.Newton for an injection.   Call back: [phone]

## 2019-08-06 NOTE — Telephone Encounter (Signed)
Done

## 2019-08-06 NOTE — Telephone Encounter (Signed)
Note that pt had appt with me on Thursday 6/17 and patient just called to cancel that appt.  As I was preparing for that appt, pharmacy noted that pt has never filled RX for keppra from me.  Last filled RX for keppra was from Dr. Durward Fortes and pt not refilling

## 2019-08-06 NOTE — Telephone Encounter (Signed)
Called and left a message-renewed hydrocodone

## 2019-08-06 NOTE — Telephone Encounter (Signed)
Please see below.

## 2019-08-06 NOTE — Progress Notes (Signed)
Pre-procedure diazepam ordered for pre-operative anxiety.  

## 2019-08-06 NOTE — Telephone Encounter (Signed)
Pt called stating she's being referred to Dr. Ernestina Patches and she missed the call and would like to receive another.   [phone]
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
## 2019-08-06 NOTE — Telephone Encounter (Signed)
Pt would like to have valium for appt 09/11/19. Pharmacy is updated.

## 2019-08-08 ENCOUNTER — Ambulatory Visit: Payer: Medicare Other | Admitting: Neurology

## 2019-08-13 ENCOUNTER — Ambulatory Visit: Payer: Medicare Other | Admitting: Orthopaedic Surgery

## 2019-08-14 ENCOUNTER — Encounter: Payer: Self-pay | Admitting: Orthopaedic Surgery

## 2019-08-14 ENCOUNTER — Ambulatory Visit: Payer: Medicare Other | Admitting: Orthopaedic Surgery

## 2019-08-14 ENCOUNTER — Other Ambulatory Visit: Payer: Self-pay

## 2019-08-14 DIAGNOSIS — M545 Low back pain, unspecified: Secondary | ICD-10-CM

## 2019-08-14 MED ORDER — HYDROCODONE-ACETAMINOPHEN 5-325 MG PO TABS: 1.0000 | ORAL_TABLET | Freq: Four times a day (QID) | ORAL | 0 refills | Status: DC | PRN

## 2019-08-14 NOTE — Progress Notes (Signed)
Office Visit Note   Patient: Mindy Young           Date of Birth: 10-Apr-1945           MRN: [id number] Visit Date: 08/14/2019              Requested by: Lennie Odor, PA [address],  [place] [address] PCP: Lennie Odor, PA   Assessment & Plan: Visit Diagnoses:  1. Acute bilateral low back pain without sciatica     Plan: Mindy Young had an MRI scan of her lumbar spine demonstrate diffuse degenerative arthritis at virtually every level.  There was significant pathology at L3-4 with there is a left subarticular disc extrusion superior migration.  The descending left L4 nerve root was displaced.  No foraminal stenosis.  At L4-5 there was severe facet hypertrophy causing grade 1 anterior listhesis with disc uncovering.  There was severe spinal canal stenosis and mild bilateral neural foraminal stenosis.  She is experiencing pain when she stands for any length of time and even trouble sleeping at night.  She seems to have a little more trouble on the left than the right even involving her left leg.  She has had some knee pain on the left which certainly could be result of the L4 nerve root.  After much discussion I would like her to see Dr. Ernestina Patches for an epidural steroid injection.  She is having pain "everywhere and there is a concern she might have fibromyalgia.  She has an appointment to see one of the new physicians at [facility] tomorrow.  She is taking a lot of hydrocodone.  I will again renew her prescription but I think she eventually will need to have her medical doctor or pain clinic continue with the medicines because I am not sure that she is really needs the medicine from a purely orthopedic standpoint.  Follow-up 1 month  Follow-Up Instructions: Return in about 1 month (around 09/13/2019).   Orders:  No orders of the defined types were placed in this encounter.  No orders of the defined types were placed in this encounter.      Procedures: No procedures performed   Clinical Data: No additional findings.   Subjective: Chief Complaint  Patient presents with  . Lower Back - Follow-up    MRI Review  Had an MRI scan of her lumbar spine which was reviewed above.  No change in his symptoms.  Has an appointment to see [facility] tomorrow.  Still having quite a bit of back pain, pelvic discomfort and bilateral lower extremity pain worse when she is stands for long period of time or even at night when she sleeps.  Continues to take hydrocodone  HPI  Review of Systems   Objective: Vital Signs: There were no vitals taken for this visit.  Physical Exam Constitutional:      Appearance: She is well-developed.  Eyes:     Pupils: Pupils are equal, round, and reactive to light.  Pulmonary:     Effort: Pulmonary effort is normal.  Skin:    General: Skin is warm and dry.  Neurological:     Mental Status: She is alert and oriented to person, place, and time.  Psychiatric:        Behavior: Behavior normal.     Ortho Exam straight leg raise negative bilaterally.  Motor exam intact.  Relates having good sensation in both of her feet.  Does have some  percussible tenderness of her lumbar spine  Specialty Comments:  No specialty comments available.  Imaging: No results found.   PMFS History: Patient Active Problem List   Diagnosis Date Noted  . Impingement syndrome of left shoulder 03/27/2019  . Pain in left shoulder 02/21/2019  . Rib pain on right side 02/21/2019  . Low back pain 02/07/2019  . GERD (gastroesophageal reflux disease) 12/09/2018  . Anxiety 12/09/2018  . Difficulty speaking 12/09/2018  . Leukocytosis 12/09/2018  . Seizure-like activity ([place]) 12/09/2018  . Pelvic fracture ([place]) 11/25/2018  . Pubic bone fracture ([place]) 11/24/2018  . Left knee pain 03/29/2018  . Unilateral primary osteoarthritis, left knee 03/29/2018  . Genetic testing 01/24/2018  . Family history of breast  cancer   . Family history of prostate cancer   . Malignant neoplasm of lower-inner quadrant of right breast of female, estrogen receptor positive ([place]) 10/24/2017  . Anemia 06/29/2011  . Hypertension   . Hyponatremia 06/25/2011  . Transaminitis 06/25/2011  . Alcohol abuse 06/25/2011  . Cough 06/25/2011  . Cigarette smoker 06/25/2011   Past Medical History:  Diagnosis Date  . Alcohol abuse   . Allergy   . Anemia 06/29/2011  . Anxiety   . Arthritis   . Breast cancer ([place])    right breast  . C1 cervical fracture ([place]) 02/1997  . Cancer ([place])    melanoma  . Depression   . Family history of breast cancer   . Family history of prostate cancer   . GERD (gastroesophageal reflux disease)   . Hypertension   . Tardive dyskinesia     Family History  Problem Relation Age of Onset  . COPD Mother   . Prostate cancer Father 66       seed implant for treatment  . Colon polyps Father        'a few'  . Breast cancer Sister 87  . Breast cancer Maternal Aunt        dx >50  . Breast cancer Other 35       bilateral  . Colon cancer Neg Hx     Past Surgical History:  Procedure Laterality Date  . ABDOMINAL HYSTERECTOMY    . ASPIRATION OF ABSCESS Right 11/20/2017   Procedure: ASPIRATION OF RIGHT AXILLARY SEROMA;  Surgeon: Rolm Bookbinder, MD;  Location: [place];  Service: General;  Laterality: Right;  . AUGMENTATION MAMMAPLASTY Bilateral    biateral implants , approx 2015  . BREAST LUMPECTOMY Right 11/02/2017   re-ex 11-20-17  . BREAST LUMPECTOMY WITH RADIOACTIVE SEED AND SENTINEL LYMPH NODE BIOPSY Right 11/02/2017   Procedure: BREAST LUMPECTOMY WITH RADIOACTIVE SEED AND SENTINEL LYMPH NODE BIOPSY;  Surgeon: Rolm Bookbinder, MD;  Location: [place];  Service: General;  Laterality: Right;  . CHOLECYSTECTOMY    . collarbone    . INCONTINENCE SURGERY    . KNEE SURGERY     removal of cyst, repair of cartiledge  . LAPAROSCOPY     for endometriosis  .  RE-EXCISION OF BREAST LUMPECTOMY Right 11/20/2017   Procedure: RE-EXCISION OF RIGHT BREAST MARGINS;  Surgeon: Rolm Bookbinder, MD;  Location: [place];  Service: General;  Laterality: Right;  . ROTATOR CUFF REPAIR     left   Social History   Occupational History  . Not on file  Tobacco Use  . Smoking status: Current Every Day Smoker    Packs/day: 1.00    Types: Cigarettes  . Smokeless tobacco: Never Used  .  Tobacco comment: e-cigs  Vaping Use  . Vaping Use: Some days  Substance and Sexual Activity  . Alcohol use: Yes    Alcohol/week: 21.0 standard drinks    Types: 21 Standard drinks or equivalent per week    Comment: 2 per weekend day; denies during the week (not consistent with prior hx)  . Drug use: No  . Sexual activity: Not on file     Garald Balding, MD   Note - This record has been created using [employer].  Chart creation errors have been sought, but may not always  have been located. Such creation errors do not reflect on  the standard of medical care.

## 2019-09-11 ENCOUNTER — Ambulatory Visit: Payer: Self-pay

## 2019-09-11 ENCOUNTER — Encounter: Payer: Self-pay | Admitting: Physical Medicine and Rehabilitation

## 2019-09-11 ENCOUNTER — Ambulatory Visit: Payer: Medicare Other | Admitting: Physical Medicine and Rehabilitation

## 2019-09-11 ENCOUNTER — Other Ambulatory Visit: Payer: Self-pay

## 2019-09-11 VITALS — BP 164/94 | HR 106

## 2019-09-11 DIAGNOSIS — M48061 Spinal stenosis, lumbar region without neurogenic claudication: Secondary | ICD-10-CM | POA: Diagnosis not present

## 2019-09-11 DIAGNOSIS — M5416 Radiculopathy, lumbar region: Secondary | ICD-10-CM | POA: Diagnosis not present

## 2019-09-11 DIAGNOSIS — M48062 Spinal stenosis, lumbar region with neurogenic claudication: Secondary | ICD-10-CM

## 2019-09-11 DIAGNOSIS — M4316 Spondylolisthesis, lumbar region: Secondary | ICD-10-CM | POA: Diagnosis not present

## 2019-09-11 MED ORDER — METHYLPREDNISOLONE ACETATE 80 MG/ML IJ SUSP
40.0000 mg | Freq: Once | INTRAMUSCULAR | Status: AC
  Administered 2019-09-11: 40 mg

## 2019-09-11 NOTE — Procedures (Signed)
Lumbosacral Transforaminal Epidural Steroid Injection - Sub-Pedicular Approach with Fluoroscopic Guidance  Patient: Mindy Young      Date of Birth: Oct 23, 1945 MRN: [id number] PCP: Lennie Odor, PA      Visit Date: 09/11/2019   Universal Protocol:    Date/Time: 09/11/2019  Consent Given By: the patient  Position: PRONE  Additional Comments: Vital signs were monitored before and after the procedure. Patient was prepped and draped in the usual sterile fashion. The correct patient, procedure, and site was verified.   Injection Procedure Details:  Procedure Site One Meds Administered:  Meds ordered this encounter  Medications  . methylPREDNISolone acetate (DEPO-MEDROL) injection 40 mg    Laterality: Left  Location/Site:  L4-L5  Needle size: 22 G  Needle type: Spinal  Needle Placement: Transforaminal  Findings:    -Comments: Excellent flow of contrast along the nerve, nerve root and into the epidural space.  Procedure Details: After squaring off the end-plates to get a true AP view, the C-arm was positioned so that an oblique view of the foramen as noted above was visualized. The target area is just inferior to the "nose of the scotty dog" or sub pedicular. The soft tissues overlying this structure were infiltrated with 2-3 ml. of 1% Lidocaine without Epinephrine.  The spinal needle was inserted toward the target using a "trajectory" view along the fluoroscope beam.  Under AP and lateral visualization, the needle was advanced so it did not puncture dura and was located close the 6 O'Clock position of the pedical in AP tracterory. Biplanar projections were used to confirm position. Aspiration was confirmed to be negative for CSF and/or blood. A 1-2 ml. volume of Isovue-250 was injected and flow of contrast was noted at each level. Radiographs were obtained for documentation purposes.   After attaining the desired flow of contrast documented above, a 0.5 to 1.0 ml test dose  of 0.25% Marcaine was injected into each respective transforaminal space.  The patient was observed for 90 seconds post injection.  After no sensory deficits were reported, and normal lower extremity motor function was noted,   the above injectate was administered so that equal amounts of the injectate were placed at each foramen (level) into the transforaminal epidural space.   Additional Comments:  The patient tolerated the procedure well Dressing: 2 x 2 sterile gauze and Band-Aid    Post-procedure details: Patient was observed during the procedure. Post-procedure instructions were reviewed.  Patient left the clinic in stable condition.

## 2019-09-11 NOTE — Progress Notes (Signed)
Pt state left hip pain. Pt states sitting for long period of time makes the pain worse. Pt state nothing makes the pain better.  Numeric Pain Rating Scale and Functional Assessment Average Pain 7   In the last MONTH (on 0-10 scale) has pain interfered with the following?  1. General activity like being  able to carry out your everyday physical activities such as walking, climbing stairs, carrying groceries, or moving a chair?  Rating(9)   +Driver, -BT, -Dye Allergies.

## 2019-09-11 NOTE — Progress Notes (Signed)
Mindy Young - 74 y.o. female MRN [id number]  Date of birth: Oct 08, 1945  Office Visit Note: Visit Date: 09/11/2019 PCP: Lennie Odor, PA Referred by: Lennie Odor, PA  Subjective: Chief Complaint  Patient presents with  . Left Hip - Pain  . Lower Back - Pain   HPI:  Mindy Young is a 74 y.o. female who comes in today at the request of Dr. Joni Fears for planned Left L4-L5 Lumbar epidural steroid injection with fluoroscopic guidance.  The patient has failed conservative care including home exercise, medications, time and activity modification.  This injection will be diagnostic and hopefully therapeutic.  Please see requesting physician notes for further details and justification.  Therapy dog, [place], in the room.  ROS Otherwise per HPI.  Assessment & Plan: Visit Diagnoses:  1. Lumbar radiculopathy   2. Spinal stenosis of lumbar region with neurogenic claudication   3. Spondylolisthesis of lumbar region     Plan: No additional findings.   Meds & Orders:  Meds ordered this encounter  Medications  . methylPREDNISolone acetate (DEPO-MEDROL) injection 40 mg    Orders Placed This Encounter  Procedures  . XR C-ARM NO REPORT  . Epidural Steroid injection    Follow-up: Return if symptoms worsen or fail to improve.   Procedures: No procedures performed  Lumbosacral Transforaminal Epidural Steroid Injection - Sub-Pedicular Approach with Fluoroscopic Guidance  Patient: Mindy Young      Date of Birth: 1945/11/22 MRN: [id number] PCP: Lennie Odor, PA      Visit Date: 09/11/2019   Universal Protocol:    Date/Time: 09/11/2019  Consent Given By: the patient  Position: PRONE  Additional Comments: Vital signs were monitored before and after the procedure. Patient was prepped and draped in the usual sterile fashion. The correct patient, procedure, and site was verified.   Injection Procedure Details:  Procedure Site One Meds Administered:  Meds ordered this  encounter  Medications  . methylPREDNISolone acetate (DEPO-MEDROL) injection 40 mg    Laterality: Left  Location/Site:  L4-L5  Needle size: 22 G  Needle type: Spinal  Needle Placement: Transforaminal  Findings:    -Comments: Excellent flow of contrast along the nerve, nerve root and into the epidural space.  Procedure Details: After squaring off the end-plates to get a true AP view, the C-arm was positioned so that an oblique view of the foramen as noted above was visualized. The target area is just inferior to the "nose of the scotty dog" or sub pedicular. The soft tissues overlying this structure were infiltrated with 2-3 ml. of 1% Lidocaine without Epinephrine.  The spinal needle was inserted toward the target using a "trajectory" view along the fluoroscope beam.  Under AP and lateral visualization, the needle was advanced so it did not puncture dura and was located close the 6 O'Clock position of the pedical in AP tracterory. Biplanar projections were used to confirm position. Aspiration was confirmed to be negative for CSF and/or blood. A 1-2 ml. volume of Isovue-250 was injected and flow of contrast was noted at each level. Radiographs were obtained for documentation purposes.   After attaining the desired flow of contrast documented above, a 0.5 to 1.0 ml test dose of 0.25% Marcaine was injected into each respective transforaminal space.  The patient was observed for 90 seconds post injection.  After no sensory deficits were reported, and normal lower extremity motor function was noted,   the above injectate was administered so that equal amounts of the injectate  were placed at each foramen (level) into the transforaminal epidural space.   Additional Comments:  The patient tolerated the procedure well Dressing: 2 x 2 sterile gauze and Band-Aid    Post-procedure details: Patient was observed during the procedure. Post-procedure instructions were reviewed.  Patient left the  clinic in stable condition.      Clinical History: CLINICAL DATA:  Low back pain since falling and fracturing her pelvis November 2020.  EXAM: MRI LUMBAR SPINE WITHOUT CONTRAST  TECHNIQUE: Multiplanar, multisequence MR imaging of the lumbar spine was performed. No intravenous contrast was administered.  COMPARISON:  None.  FINDINGS: Segmentation:  Standard  Alignment: Grade 1 anterolisthesis at L4-5 and grade 1 retrolisthesis at L5-S1 and L2-3.  Vertebrae:  No fracture, evidence of discitis, or bone lesion.  Conus medullaris and cauda equina: Conus extends to the L2 level. Conus and cauda equina appear normal.  Paraspinal and other soft tissues: Negative  Disc levels:  T12-L1: Small left asymmetric disc bulge.  No stenosis.  L1-L2: Normal disc space and facet joints. There is no spinal canal stenosis. No neural foraminal stenosis.  L2-L3: Moderate facet hypertrophy with fluid in the widened facet joints. Diffuse disc bulge. Left lateral recess narrowing without central spinal canal stenosis. Mild left neural foraminal stenosis.  L3-L4: Left subarticular disc extrusion with superior migration the pedicle level. Left lateral recess narrowing without central spinal canal stenosis. The descending left L4 nerve root is displaced. No neural foraminal stenosis.  L4-L5: Severe facet hypertrophy causing grade 1 anterolisthesis with disc uncovering. Severe spinal canal stenosis. Mild bilateral neural foraminal stenosis.  L5-S1: Moderate bilateral facet hypertrophy. Disc space narrowing with mild disc bulge. Narrowing of both lateral recesses without central spinal canal stenosis. No neural foraminal stenosis.  Visualized sacrum: Normal.  IMPRESSION: 1. L4-L5 severe spinal canal stenosis secondary to combination of severe facet arthrosis and spondylolisthesis. 2. L3-L4 left subarticular disc extrusion with superior migration narrowing the left  lateral recess and displacing the descending left L4 nerve root. 3. Multilevel moderate-to-severe facet arthrosis.   Electronically Signed   By: Ulyses Jarred M.D.   On: 08/03/2019 23:51     Objective:  VS:  HT:    WT:   BMI:     BP:(!) 164/94  HR:(!) 106bpm  TEMP: ( )  RESP:  Physical Exam Constitutional:      General: She is not in acute distress.    Appearance: Normal appearance. She is not ill-appearing.  HENT:     Head: Normocephalic and atraumatic.     Right Ear: External ear normal.     Left Ear: External ear normal.  Eyes:     Extraocular Movements: Extraocular movements intact.  Cardiovascular:     Rate and Rhythm: Normal rate.     Pulses: Normal pulses.  Musculoskeletal:     Right lower leg: No edema.     Left lower leg: No edema.     Comments: Patient has good distal strength with no pain over the greater trochanters.  No clonus or focal weakness.  Skin:    Findings: No erythema, lesion or rash.  Neurological:     General: No focal deficit present.     Mental Status: She is alert and oriented to person, place, and time.     Sensory: No sensory deficit.     Motor: No weakness or abnormal muscle tone.     Coordination: Coordination normal.  Psychiatric:        Mood and Affect: Mood normal.  Behavior: Behavior normal.      Imaging: No results found.

## 2019-09-26 NOTE — Progress Notes (Deleted)
Assessment/Plan:   ***1.  Multiple seizures in the setting of hyponatremia  -Patient has been seizure-free.  EEG was abnormal at the time, demonstrating left temporal seizures.  Patient was started on Keppra because of the large number of seizures.  The patient did have an outpatient normal routine EEG but declined ambulatory EEG.  The patient decreased her Keppra from 1000 mg twice per day to 1000 mg daily, partly because of mood change, which can happen with this medication.  We had offered to change her to a different medication, but she had declined that.  We talked about the importance of weaning and going off of alcohol altogether (we could help with resources), which could certainly play a role here as well.   Subjective:   Mindy Young was seen today in follow up for seizure.  My previous records as well as any outside records available were reviewed prior to todays visit.  I have not seen her in January (had an appointment in June but canceled it).  After our last visit, a routine EEG was completed that was normal.  She declined recommended ambulatory EEG.  Patient taking Keppra, 1000 mg daily.  I did get a call from a nurse that helps to take care of the patient in February stating that the patient was "drinking and driving 427 mph on her way to [employer]."  The nurse also stated that the patient told her that I told her it is okay to stop the [place], which was not within our conversation.  Nonetheless, I was able to speak with her various pharmacies, as we did send medication to her [facility] as well.  She did fill her prescription for Keppra, 500 mg, 2 tablets twice per day, number 120 pills on February 17, 2019 as well as January 21, 2019 at [facility] here in [place] (prescribed by Dr. Dessa Phi) and also had a prescription filled by me in [place] on February 19 for Keppra, 1000 mg, 1 tablet twice per day, #90, which was filled 1 time (we called at the end of  June). Medical records are reviewed.  She has been following with orthopedics, Dr. Durward Fortes.  She has gotten epidural steroid injections.  He noted that the patient was "taking a lot of hydrocodone" and thought she may need to see a pain clinic for evaluation.  PDMP is reviewed and the patient did receive 30 hydrocodone on June 15 and another 30 on June 23.  PREVIOUS MEDICATIONS: {[facility] RX:18200}  CURRENT MEDICATIONS:  Outpatient Encounter Medications as of 09/27/2019  Medication Sig  . ALPRAZolam (XANAX) 0.5 MG tablet Take 0.5 mg by mouth 2 (two) times daily as needed for anxiety.   Marland Kitchen amLODipine (NORVASC) 5 MG tablet Take 5 mg by mouth daily.  . cetirizine (ZYRTEC) 10 MG tablet Take 10 mg by mouth as needed for allergies.  . cloNIDine (CATAPRES) 0.1 MG tablet Take 1 tablet by mouth 2 (two) times daily.  . diazepam (VALIUM) 5 MG tablet Take 1 by mouth 1 hour  pre-procedure with very light food. May bring 2nd tablet to appointment.  Marland Kitchen HYDROcodone-acetaminophen (NORCO/VICODIN) 5-325 MG tablet Take 1 tablet by mouth every 6 (six) hours as needed for moderate pain.  Marland Kitchen HYDROcodone-acetaminophen (NORCO/VICODIN) 5-325 MG tablet Take 1 tablet by mouth every 6 (six) hours as needed for moderate pain.  Marland Kitchen HYDROcodone-acetaminophen (NORCO/VICODIN) 5-325 MG tablet Take 1-2 tablets by mouth every 6 (six) hours as needed for moderate pain.  Marland Kitchen  HYDROcodone-acetaminophen (NORCO/VICODIN) 5-325 MG tablet Take 1 tablet by mouth every 6 (six) hours as needed for moderate pain.  Marland Kitchen HYDROcodone-acetaminophen (NORCO/VICODIN) 5-325 MG tablet Take 1 tablet by mouth every 6 (six) hours as needed for moderate pain.  Marland Kitchen HYDROcodone-acetaminophen (NORCO/VICODIN) 5-325 MG tablet Take 1 tablet by mouth every 6 (six) hours as needed for moderate pain.  Marland Kitchen HYDROcodone-acetaminophen (NORCO/VICODIN) 5-325 MG tablet Take 1 tablet by mouth every 6 (six) hours as needed for moderate pain.  Marland Kitchen HYDROcodone-acetaminophen (NORCO/VICODIN)  5-325 MG tablet Take 1 tablet by mouth every 6 (six) hours as needed for moderate pain.  Marland Kitchen HYDROcodone-acetaminophen (NORCO/VICODIN) 5-325 MG tablet Take 1 tablet by mouth every 6 (six) hours as needed for moderate pain.  Marland Kitchen HYDROcodone-acetaminophen (NORCO/VICODIN) 5-325 MG tablet Take 2 tablets by mouth every 6 (six) hours as needed for moderate pain.  Marland Kitchen HYDROcodone-acetaminophen (NORCO/VICODIN) 5-325 MG tablet Take 1 tablet by mouth every 6 (six) hours as needed for moderate pain.  Marland Kitchen HYDROcodone-acetaminophen (NORCO/VICODIN) 5-325 MG tablet Take 1 tablet by mouth every 6 (six) hours as needed for moderate pain.  Marland Kitchen letrozole (FEMARA) 2.5 MG tablet TAKE 1 TABLET(2.5 MG) BY MOUTH DAILY  . levETIRAcetam (KEPPRA) 1000 MG tablet Take 1 tablet (1,000 mg total) by mouth 2 (two) times daily.  Marland Kitchen levofloxacin (LEVAQUIN) 500 MG tablet Take 500 mg by mouth daily.  . methylphenidate (RITALIN) 20 MG tablet Take 20 mg by mouth daily.   . metoprolol succinate (TOPROL-XL) 100 MG 24 hr tablet Take 100 mg by mouth daily.   Marland Kitchen omeprazole (PRILOSEC) 40 MG capsule TAKE 1 CAPSULE BY MOUTH TWICE DAILY 30 MINUTES BEFORE BREAKFAST AND 30 MINUTES BEFORE DINNER (Patient taking differently: Take 40 mg by mouth 2 (two) times daily. 30 minutes before breakfast, and 30  minutes before dinner.)  . oxybutynin (DITROPAN-XL) 10 MG 24 hr tablet Take 10 mg by mouth daily.   . polyethylene glycol (MIRALAX / GLYCOLAX) 17 g packet Take 17 g by mouth daily as needed for moderate constipation.  . promethazine (PHENERGAN) 25 MG tablet Take 25 mg by mouth every 6 (six) hours as needed for nausea or vomiting.  . pyridOXINE (B-6) 100 MG tablet Take 1 tablet (100 mg total) by mouth daily.  . traMADol (ULTRAM) 50 MG tablet Take 1 tablet (50 mg total) by mouth every 12 (twelve) hours as needed.   No facility-administered encounter medications on file as of 09/27/2019.     Objective:   PHYSICAL EXAMINATION:    VITALS:  There were no vitals  filed for this visit.  GEN:  The patient appears stated age and is in NAD. HEENT:  Normocephalic, atraumatic.  The mucous membranes are moist. The superficial temporal arteries are without ropiness or tenderness. CV:  RRR Lungs:  CTAB Neck/HEME:  There are no carotid bruits bilaterally.  Neurological examination:  Orientation: The patient is alert and oriented x3. Cranial nerves: There is good facial symmetry.The speech is fluent and clear. Soft palate rises symmetrically and there is no tongue deviation. Hearing is intact to conversational tone. Sensation: Sensation is intact to light touch throughout Motor: Strength is at least antigravity x4.  Movement examination: Tone: There is normal tone in the UE/LE Abnormal movements:  no tremor.  No myoclonus.  No asterixis.   Coordination:  There is no decremation with RAM's. Gait and Station: The patient has no difficulty arising out of a deep-seated chair without the use of the hands. The patient's stride length is good.      ***  Total time spent on today's visit was *** minutes, including both face-to-face time and nonface-to-face time.  Time included that spent on review of records (prior notes available to me/labs/imaging if pertinent), discussing treatment and goals, answering patient's questions and coordinating care.  Cc:  Lennie Odor, PA

## 2019-09-27 ENCOUNTER — Ambulatory Visit: Payer: Medicare Other | Admitting: Neurology

## 2019-12-10 ENCOUNTER — Other Ambulatory Visit: Payer: Self-pay | Admitting: Internal Medicine

## 2019-12-10 ENCOUNTER — Other Ambulatory Visit: Payer: Self-pay | Admitting: Obstetrics & Gynecology

## 2019-12-10 DIAGNOSIS — N644 Mastodynia: Secondary | ICD-10-CM

## 2019-12-11 ENCOUNTER — Other Ambulatory Visit: Payer: Self-pay | Admitting: Obstetrics & Gynecology

## 2019-12-11 DIAGNOSIS — Z9889 Other specified postprocedural states: Secondary | ICD-10-CM

## 2020-05-19 ENCOUNTER — Other Ambulatory Visit: Payer: Self-pay | Admitting: Orthopaedic Surgery

## 2020-05-19 DIAGNOSIS — G8929 Other chronic pain: Secondary | ICD-10-CM

## 2020-05-19 DIAGNOSIS — M545 Low back pain, unspecified: Secondary | ICD-10-CM

## 2020-06-13 ENCOUNTER — Inpatient Hospital Stay (HOSPITAL_COMMUNITY)
Admission: EM | Admit: 2020-06-13 | Discharge: 2020-06-15 | DRG: 101 | Disposition: A | Discharge status: Home / Self Care | Payer: Medicare Other | Attending: Internal Medicine | Admitting: Internal Medicine

## 2020-06-13 ENCOUNTER — Emergency Department (HOSPITAL_COMMUNITY): Payer: Medicare Other

## 2020-06-13 DIAGNOSIS — Z9049 Acquired absence of other specified parts of digestive tract: Secondary | ICD-10-CM

## 2020-06-13 DIAGNOSIS — E871 Hypo-osmolality and hyponatremia: Secondary | ICD-10-CM | POA: Diagnosis present

## 2020-06-13 DIAGNOSIS — E876 Hypokalemia: Secondary | ICD-10-CM | POA: Diagnosis present

## 2020-06-13 DIAGNOSIS — S92001A Unspecified fracture of right calcaneus, initial encounter for closed fracture: Secondary | ICD-10-CM | POA: Diagnosis present

## 2020-06-13 DIAGNOSIS — Z91128 Patient's intentional underdosing of medication regimen for other reason: Secondary | ICD-10-CM

## 2020-06-13 DIAGNOSIS — Z20822 Contact with and (suspected) exposure to covid-19: Secondary | ICD-10-CM | POA: Diagnosis present

## 2020-06-13 DIAGNOSIS — Z90710 Acquired absence of both cervix and uterus: Secondary | ICD-10-CM | POA: Diagnosis not present

## 2020-06-13 DIAGNOSIS — R1110 Vomiting, unspecified: Secondary | ICD-10-CM

## 2020-06-13 DIAGNOSIS — Z85820 Personal history of malignant melanoma of skin: Secondary | ICD-10-CM

## 2020-06-13 DIAGNOSIS — F419 Anxiety disorder, unspecified: Secondary | ICD-10-CM | POA: Diagnosis present

## 2020-06-13 DIAGNOSIS — Z825 Family history of asthma and other chronic lower respiratory diseases: Secondary | ICD-10-CM | POA: Diagnosis not present

## 2020-06-13 DIAGNOSIS — K219 Gastro-esophageal reflux disease without esophagitis: Secondary | ICD-10-CM | POA: Diagnosis present

## 2020-06-13 DIAGNOSIS — Z853 Personal history of malignant neoplasm of breast: Secondary | ICD-10-CM | POA: Diagnosis not present

## 2020-06-13 DIAGNOSIS — F32A Depression, unspecified: Secondary | ICD-10-CM | POA: Diagnosis present

## 2020-06-13 DIAGNOSIS — R197 Diarrhea, unspecified: Secondary | ICD-10-CM

## 2020-06-13 DIAGNOSIS — I10 Essential (primary) hypertension: Secondary | ICD-10-CM | POA: Diagnosis present

## 2020-06-13 DIAGNOSIS — F17210 Nicotine dependence, cigarettes, uncomplicated: Secondary | ICD-10-CM | POA: Diagnosis present

## 2020-06-13 DIAGNOSIS — G40909 Epilepsy, unspecified, not intractable, without status epilepticus: Principal | ICD-10-CM | POA: Diagnosis present

## 2020-06-13 DIAGNOSIS — Z8371 Family history of colonic polyps: Secondary | ICD-10-CM

## 2020-06-13 DIAGNOSIS — Y92009 Unspecified place in unspecified non-institutional (private) residence as the place of occurrence of the external cause: Secondary | ICD-10-CM

## 2020-06-13 DIAGNOSIS — Z803 Family history of malignant neoplasm of breast: Secondary | ICD-10-CM | POA: Diagnosis not present

## 2020-06-13 DIAGNOSIS — Z8042 Family history of malignant neoplasm of prostate: Secondary | ICD-10-CM | POA: Diagnosis not present

## 2020-06-13 DIAGNOSIS — T426X6A Underdosing of other antiepileptic and sedative-hypnotic drugs, initial encounter: Secondary | ICD-10-CM | POA: Diagnosis present

## 2020-06-13 DIAGNOSIS — R569 Unspecified convulsions: Secondary | ICD-10-CM

## 2020-06-13 DIAGNOSIS — Z716 Tobacco abuse counseling: Secondary | ICD-10-CM | POA: Diagnosis not present

## 2020-06-13 LAB — CBC WITH DIFFERENTIAL/PLATELET
Abs Immature Granulocytes: 0.16 10*3/uL — ABNORMAL HIGH (ref 0.00–0.07)
Basophils Absolute: 0 10*3/uL (ref 0.0–0.1)
Basophils Relative: 0 %
Eosinophils Absolute: 0 10*3/uL (ref 0.0–0.5)
Eosinophils Relative: 0 %
HCT: 37.7 % (ref 36.0–46.0)
Hemoglobin: 13.7 g/dL (ref 12.0–15.0)
Immature Granulocytes: 1 %
Lymphocytes Relative: 15 %
Lymphs Abs: 2.5 10*3/uL (ref 0.7–4.0)
MCH: 35.5 pg — ABNORMAL HIGH (ref 26.0–34.0)
MCHC: 36.3 g/dL — ABNORMAL HIGH (ref 30.0–36.0)
MCV: 97.7 fL (ref 80.0–100.0)
Monocytes Absolute: 2 10*3/uL — ABNORMAL HIGH (ref 0.1–1.0)
Monocytes Relative: 12 %
Neutro Abs: 11.4 10*3/uL — ABNORMAL HIGH (ref 1.7–7.7)
Neutrophils Relative %: 72 %
Platelets: 290 10*3/uL (ref 150–400)
RBC: 3.86 MIL/uL — ABNORMAL LOW (ref 3.87–5.11)
RDW: 13.8 % (ref 11.5–15.5)
WBC: 16.1 10*3/uL — ABNORMAL HIGH (ref 4.0–10.5)
nRBC: 0 % (ref 0.0–0.2)

## 2020-06-13 LAB — COMPREHENSIVE METABOLIC PANEL
ALT: 22 U/L (ref 0–44)
AST: 43 U/L — ABNORMAL HIGH (ref 15–41)
Albumin: 3.8 g/dL (ref 3.5–5.0)
Alkaline Phosphatase: 76 U/L (ref 38–126)
Anion gap: 15 (ref 5–15)
BUN: 16 mg/dL (ref 8–23)
CO2: 22 mmol/L (ref 22–32)
Calcium: 8.9 mg/dL (ref 8.9–10.3)
Chloride: 86 mmol/L — ABNORMAL LOW (ref 98–111)
Creatinine, Ser: 1.01 mg/dL — ABNORMAL HIGH (ref 0.44–1.00)
GFR, Estimated: 58 mL/min — ABNORMAL LOW (ref >60)
Glucose, Bld: 164 mg/dL — ABNORMAL HIGH (ref 70–99)
Potassium: 2.8 mmol/L — ABNORMAL LOW (ref 3.5–5.1)
Sodium: 123 mmol/L — ABNORMAL LOW (ref 135–145)
Total Bilirubin: 1.4 mg/dL — ABNORMAL HIGH (ref 0.3–1.2)
Total Protein: 6.4 g/dL — ABNORMAL LOW (ref 6.5–8.1)

## 2020-06-13 LAB — LIPASE, BLOOD: Lipase: 37 U/L (ref 11–51)

## 2020-06-13 LAB — ETHANOL: Alcohol, Ethyl (B): <10 mg/dL (ref <10)

## 2020-06-13 MED ORDER — POTASSIUM CHLORIDE 10 MEQ/100ML IV SOLN
10.0000 meq | Freq: Once | INTRAVENOUS | Status: AC
  Administered 2020-06-13: 10 meq via INTRAVENOUS
  Filled 2020-06-13: qty 100

## 2020-06-13 MED ORDER — SODIUM CHLORIDE 0.9 % IV SOLN
1300.0000 mg | Freq: Once | INTRAVENOUS | Status: AC
  Administered 2020-06-13: 1300 mg via INTRAVENOUS
  Filled 2020-06-13: qty 13

## 2020-06-13 MED ORDER — POTASSIUM CHLORIDE CRYS ER 20 MEQ PO TBCR
40.0000 meq | EXTENDED_RELEASE_TABLET | Freq: Once | ORAL | Status: AC
  Administered 2020-06-13: 40 meq via ORAL
  Filled 2020-06-13: qty 2

## 2020-06-13 MED ORDER — LACTATED RINGERS IV BOLUS
1000.0000 mL | Freq: Once | INTRAVENOUS | Status: AC
  Administered 2020-06-13: 1000 mL via INTRAVENOUS

## 2020-06-13 MED ORDER — HYDROCODONE-ACETAMINOPHEN 5-325 MG PO TABS
1.0000 | ORAL_TABLET | Freq: Once | ORAL | Status: AC
Start: 2020-06-13 — End: 2020-06-13
  Administered 2020-06-13: 1 via ORAL
  Filled 2020-06-13: qty 1

## 2020-06-13 NOTE — ED Triage Notes (Signed)
Pt arrives from EMS, EMS reports pt was out to eat and had about 3 witness seizure by daughter. EMS reports pt has an hx of seizures however, pt states she does not take any of seizure meds at this time

## 2020-06-13 NOTE — ED Provider Notes (Signed)
[facility] EMERGENCY DEPARTMENT Provider Note   CSN: [id number] Arrival date & time: 06/13/20  2013     History Chief Complaint  Patient presents with  . Seizures    Mindy Young is a 75 y.o. female.  75 year old female with past medical history below including breast cancer, anxiety/depression, GERD, hypertension, tardive dyskinesia, alcohol abuse, seizure, hyponatremia who presents with seizure.  This evening just prior to arrival, the patient was out at a restaurant to eat when her cousin reports that she had 2 seizures.  Both seizures were tonic-clonic, first seizure lasted approximately 4 minutes and second seizure was shorter.  She was confused and postictal on EMS arrival, blood glucose reassuring.  They state that her mentation has improved during transport.  Patient reports history of seizures and was previously on Keppra but eventually did not get a refill and did not continue this medicine.  She reports almost 1 week of nausea, vomiting, and diarrhea.  No sick contacts, recent travel, abdominal pain, or urinary symptoms.  No URI symptoms.  Her vomiting had improved and she has not had any emesis today, has had diarrhea but overall it seems to be improving.  She has not had any alcohol recently, last use was 6 months to a year ago.  Of note, she was admitted in September 2021 for hyponatremia.  The history is provided by the patient and a relative.  Seizures      Past Medical History:  Diagnosis Date  . Alcohol abuse   . Allergy   . Anemia 06/29/2011  . Anxiety   . Arthritis   . Breast cancer ([place])    right breast  . C1 cervical fracture ([place]) 02/1997  . Cancer ([place])    melanoma  . Depression   . Family history of breast cancer   . Family history of prostate cancer   . GERD (gastroesophageal reflux disease)   . Hypertension   . Tardive dyskinesia     Patient Active Problem List   Diagnosis Date Noted  . Impingement syndrome of left shoulder  03/27/2019  . Pain in left shoulder 02/21/2019  . Rib pain on right side 02/21/2019  . Low back pain 02/07/2019  . GERD (gastroesophageal reflux disease) 12/09/2018  . Anxiety 12/09/2018  . Difficulty speaking 12/09/2018  . Leukocytosis 12/09/2018  . Seizure-like activity ([place]) 12/09/2018  . Pelvic fracture ([place]) 11/25/2018  . Pubic bone fracture ([place]) 11/24/2018  . Left knee pain 03/29/2018  . Unilateral primary osteoarthritis, left knee 03/29/2018  . Genetic testing 01/24/2018  . Family history of breast cancer   . Family history of prostate cancer   . Malignant neoplasm of lower-inner quadrant of right breast of female, estrogen receptor positive ([place]) 10/24/2017  . Anemia 06/29/2011  . Hypertension   . Hyponatremia 06/25/2011  . Transaminitis 06/25/2011  . Alcohol abuse 06/25/2011  . Cough 06/25/2011  . Cigarette smoker 06/25/2011    Past Surgical History:  Procedure Laterality Date  . ABDOMINAL HYSTERECTOMY    . ASPIRATION OF ABSCESS Right 11/20/2017   Procedure: ASPIRATION OF RIGHT AXILLARY SEROMA;  Surgeon: Rolm Bookbinder, MD;  Location: [facility];  Service: General;  Laterality: Right;  . AUGMENTATION MAMMAPLASTY Bilateral    biateral implants , approx 2015  . BREAST LUMPECTOMY Right 11/02/2017   re-ex 11-20-17  . BREAST LUMPECTOMY WITH RADIOACTIVE SEED AND SENTINEL LYMPH NODE BIOPSY Right 11/02/2017   Procedure: BREAST LUMPECTOMY WITH RADIOACTIVE SEED AND SENTINEL LYMPH NODE BIOPSY;  Surgeon: Rolm Bookbinder, MD;  Location: [facility];  Service: General;  Laterality: Right;  . CHOLECYSTECTOMY    . collarbone    . INCONTINENCE SURGERY    . KNEE SURGERY     removal of cyst, repair of cartiledge  . LAPAROSCOPY     for endometriosis  . RE-EXCISION OF BREAST LUMPECTOMY Right 11/20/2017   Procedure: RE-EXCISION OF RIGHT BREAST MARGINS;  Surgeon: Rolm Bookbinder, MD;  Location: [facility];  Service: General;   Laterality: Right;  . ROTATOR CUFF REPAIR     left     OB History   No obstetric history on file.     Family History  Problem Relation Age of Onset  . COPD Mother   . Prostate cancer Father 53       seed implant for treatment  . Colon polyps Father        'a few'  . Breast cancer Sister 30  . Breast cancer Maternal Aunt        dx >50  . Breast cancer Other 35       bilateral  . Colon cancer Neg Hx     Social History   Tobacco Use  . Smoking status: Current Every Day Smoker    Packs/day: 1.00    Types: Cigarettes  . Smokeless tobacco: Never Used  . Tobacco comment: e-cigs  Vaping Use  . Vaping Use: Some days  Substance Use Topics  . Alcohol use: Yes    Alcohol/week: 21.0 standard drinks    Types: 21 Standard drinks or equivalent per week    Comment: 2 per weekend day; denies during the week (not consistent with prior hx)  . Drug use: No    Home Medications Prior to Admission medications   Medication Sig Start Date End Date Taking? Authorizing Provider  ALPRAZolam Duanne Moron) 0.5 MG tablet Take 0.5 mg by mouth 2 (two) times daily as needed for anxiety.  08/10/10   [provider]  amLODipine (NORVASC) 5 MG tablet Take 5 mg by mouth daily.    [provider]  cetirizine (ZYRTEC) 10 MG tablet Take 10 mg by mouth as needed for allergies.    [provider]  cloNIDine (CATAPRES) 0.1 MG tablet Take 1 tablet by mouth 2 (two) times daily. 11/07/16   [provider]  diazepam (VALIUM) 5 MG tablet Take 1 by mouth 1 hour  pre-procedure with very light food. May bring 2nd tablet to appointment. 08/06/19   Magnus Sinning, MD  HYDROcodone-acetaminophen (NORCO/VICODIN) 5-325 MG tablet Take 1 tablet by mouth every 6 (six) hours as needed for moderate pain. 12/06/18   Garald Balding, MD  HYDROcodone-acetaminophen (NORCO/VICODIN) 5-325 MG tablet Take 1 tablet by mouth every 6 (six) hours as needed for moderate pain. 12/27/18   Cherylann Ratel, PA-C   HYDROcodone-acetaminophen (NORCO/VICODIN) 5-325 MG tablet Take 1-2 tablets by mouth every 6 (six) hours as needed for moderate pain. 01/02/19   Garald Balding, MD  HYDROcodone-acetaminophen (NORCO/VICODIN) 5-325 MG tablet Take 1 tablet by mouth every 6 (six) hours as needed for moderate pain. 01/15/19   Cherylann Ratel, PA-C  HYDROcodone-acetaminophen (NORCO/VICODIN) 5-325 MG tablet Take 1 tablet by mouth every 6 (six) hours as needed for moderate pain. 01/28/19   Garald Balding, MD  HYDROcodone-acetaminophen (NORCO/VICODIN) 5-325 MG tablet Take 1 tablet by mouth every 6 (six) hours as needed for moderate pain. 02/07/19   Garald Balding, MD  HYDROcodone-acetaminophen (NORCO/VICODIN) 5-325 MG  tablet Take 1 tablet by mouth every 6 (six) hours as needed for moderate pain. 07/02/19   Garald Balding, MD  HYDROcodone-acetaminophen (NORCO/VICODIN) 5-325 MG tablet Take 1 tablet by mouth every 6 (six) hours as needed for moderate pain. 07/11/19   Garald Balding, MD  HYDROcodone-acetaminophen (NORCO/VICODIN) 5-325 MG tablet Take 1 tablet by mouth every 6 (six) hours as needed for moderate pain. 07/24/19   Garald Balding, MD  HYDROcodone-acetaminophen (NORCO/VICODIN) 5-325 MG tablet Take 2 tablets by mouth every 6 (six) hours as needed for moderate pain. 07/25/19 07/24/20  Cherylann Ratel, PA-C  HYDROcodone-acetaminophen (NORCO/VICODIN) 5-325 MG tablet Take 1 tablet by mouth every 6 (six) hours as needed for moderate pain. 08/06/19   Garald Balding, MD  HYDROcodone-acetaminophen (NORCO/VICODIN) 5-325 MG tablet Take 1 tablet by mouth every 6 (six) hours as needed for moderate pain. 08/14/19   Garald Balding, MD  letrozole [facility]) 2.5 MG tablet TAKE 1 TABLET(2.5 MG) BY MOUTH DAILY 06/28/19   Nicholas Lose, MD  levETIRAcetam (KEPPRA) 1000 MG tablet Take 1 tablet (1,000 mg total) by mouth 2 (two) times daily. 04/12/19   Tat, Eustace Quail, DO  levofloxacin (LEVAQUIN) 500 MG tablet Take 500 mg by  mouth daily. 06/18/19   [provider]  methylphenidate (RITALIN) 20 MG tablet Take 20 mg by mouth daily.     [provider]  metoprolol succinate (TOPROL-XL) 100 MG 24 hr tablet Take 100 mg by mouth daily.  02/19/18   [provider]  omeprazole (PRILOSEC) 40 MG capsule TAKE 1 CAPSULE BY MOUTH TWICE DAILY 30 MINUTES BEFORE BREAKFAST AND 30 MINUTES BEFORE DINNER Patient taking differently: Take 40 mg by mouth 2 (two) times daily. 30 minutes before breakfast, and 30  minutes before dinner. 10/12/18   Mauri Pole, MD  oxybutynin (DITROPAN-XL) 10 MG 24 hr tablet Take 10 mg by mouth daily.  12/05/16   [provider]  polyethylene glycol (MIRALAX / GLYCOLAX) 17 g packet Take 17 g by mouth daily as needed for moderate constipation. 11/26/18   Oswald Hillock, MD  promethazine (PHENERGAN) 25 MG tablet Take 25 mg by mouth every 6 (six) hours as needed for nausea or vomiting.    [provider]  pyridOXINE (B-6) 100 MG tablet Take 1 tablet (100 mg total) by mouth daily. 12/14/18   Dessa Phi, DO    Allergies    Percocet [oxycodone-acetaminophen]  Review of Systems   Review of Systems  Neurological: Positive for seizures.   All other systems reviewed and are negative except that which was mentioned in HPI  Physical Exam Updated Vital Signs BP (!) 125/93   Pulse (!) 103   Temp 98.3 F (36.8 C) (Oral)   Resp 19   SpO2 99%   Physical Exam Vitals and nursing note reviewed.  Constitutional:      General: She is not in acute distress.    Appearance: She is well-developed.     Comments: Awake, alert  HENT:     Head: Normocephalic and atraumatic.     Mouth/Throat:     Comments: Abrasion right side of tongue Eyes:     Extraocular Movements: Extraocular movements intact.     Conjunctiva/sclera: Conjunctivae normal.     Pupils: Pupils are equal, round, and reactive to light.  Cardiovascular:     Rate and Rhythm: Normal rate and regular  rhythm.     Pulses: Normal pulses.     Heart sounds: Normal heart sounds.  No murmur heard.   Pulmonary:     Effort: Pulmonary effort is normal. No respiratory distress.     Breath sounds: Normal breath sounds.  Abdominal:     General: Bowel sounds are normal. There is no distension.     Palpations: Abdomen is soft.     Tenderness: There is no abdominal tenderness.  Musculoskeletal:        General: Swelling present. Normal range of motion.     Cervical back: Neck supple.     Comments: Large hematoma and swelling dorsal R foot  Skin:    General: Skin is warm and dry.  Neurological:     Mental Status: She is alert and oriented to person, place, and time.     Cranial Nerves: No cranial nerve deficit.     Motor: No abnormal muscle tone.     Deep Tendon Reflexes: Reflexes are normal and symmetric.     Comments: Fluent speech 5/5 strength and normal sensation x all 4 extremities  Psychiatric:        Thought Content: Thought content normal.        Judgment: Judgment normal.     ED Results / Procedures / Treatments   Labs (all labs ordered are listed, but only abnormal results are displayed) Labs Reviewed  COMPREHENSIVE METABOLIC PANEL - Abnormal; Notable for the following components:      Result Value   Sodium 123 (*)    Potassium 2.8 (*)    Chloride 86 (*)    Glucose, Bld 164 (*)    Creatinine, Ser 1.01 (*)    Total Protein 6.4 (*)    AST 43 (*)    Total Bilirubin 1.4 (*)    GFR, Estimated 58 (*)    All other components within normal limits  CBC WITH DIFFERENTIAL/PLATELET - Abnormal; Notable for the following components:   WBC 16.1 (*)    RBC 3.86 (*)    MCH 35.5 (*)    MCHC 36.3 (*)    Neutro Abs 11.4 (*)    Monocytes Absolute 2.0 (*)    Abs Immature Granulocytes 0.16 (*)    All other components within normal limits  GASTROINTESTINAL PANEL BY PCR, STOOL (REPLACES STOOL CULTURE)  SARS CORONAVIRUS 2 (TAT 6-24 HRS)  ETHANOL  LIPASE, BLOOD  RAPID URINE DRUG SCREEN,  HOSP PERFORMED  MAGNESIUM  SODIUM, URINE, RANDOM  OSMOLALITY, URINE  OSMOLALITY    EKG None  Radiology No results found.  Procedures Procedures   Medications Ordered in ED Medications  potassium chloride 10 mEq in 100 mL IVPB (10 mEq Intravenous New Bag/Given 06/13/20 2318)  levETIRAcetam (KEPPRA) 1,300 mg in sodium chloride 0.9 % 100 mL IVPB (0 mg Intravenous Stopped 06/13/20 2308)  lactated ringers bolus 1,000 mL (1,000 mLs Intravenous New Bag/Given 06/13/20 2321)  potassium chloride SA (KLOR-CON) CR tablet 40 mEq (40 mEq Oral Given 06/13/20 2315)  HYDROcodone-acetaminophen (NORCO/VICODIN) 5-325 MG per tablet 1 tablet (1 tablet Oral Given 06/13/20 2324)    ED Course  I have reviewed the triage vital signs and the nursing notes.  Pertinent labs & imaging results that were available during my care of the patient were reviewed by me and considered in my medical decision making (see chart for details).    MDM Rules/Calculators/A&P                          Patient was alert with GCS of 15 on exam.  Vital signs stable.  Her lab work is notable for sodium 123, potassium 2.8, chloride 86, glucose 164, creatinine 1.01 which is mildly elevated from baseline, AST 43, ALT 22, total bilirubin 1.4, normal anion gap, negative alcohol, WBC 16.1.  Her elevated white count may be a reflection of recent gastrointestinal illness or a stress response to seizures tonight.  Gave patient a Keppra load fluid bolus.  Given hx of seizure and normal neuro exam currently, I held off on head imaging.  Her seizure may be in part due to recent illness lowering seizure threshold in the setting of medication noncompliance but it is also possible that hyponatremia contributed.  Discussed with neurology, Dr. Cheral Marker, agreed with plan to restart Keppra at 500 mg twice daily.  Discussed admission with [facility], Dr. Kipp Brood.  Final Clinical Impression(s) / ED Diagnoses Final diagnoses:  Seizure ([place])  Hypokalemia   Hyponatremia  Vomiting and diarrhea    Rx / DC Orders ED Discharge Orders    None       Parrish Bonn, Wenda Overland, MD 06/13/20 [phone]

## 2020-06-14 ENCOUNTER — Encounter (HOSPITAL_COMMUNITY): Payer: Self-pay | Admitting: Family Medicine

## 2020-06-14 ENCOUNTER — Inpatient Hospital Stay (HOSPITAL_COMMUNITY): Payer: Medicare Other

## 2020-06-14 ENCOUNTER — Other Ambulatory Visit: Payer: Self-pay

## 2020-06-14 DIAGNOSIS — E876 Hypokalemia: Secondary | ICD-10-CM

## 2020-06-14 DIAGNOSIS — E871 Hypo-osmolality and hyponatremia: Secondary | ICD-10-CM

## 2020-06-14 DIAGNOSIS — R569 Unspecified convulsions: Secondary | ICD-10-CM

## 2020-06-14 LAB — RAPID URINE DRUG SCREEN, HOSP PERFORMED
Amphetamines: NOT DETECTED
Barbiturates: NOT DETECTED
Benzodiazepines: NOT DETECTED
Cocaine: NOT DETECTED
Opiates: POSITIVE — AB
Tetrahydrocannabinol: NOT DETECTED

## 2020-06-14 LAB — COMPREHENSIVE METABOLIC PANEL
ALT: 17 U/L (ref 0–44)
AST: 32 U/L (ref 15–41)
Albumin: 3.2 g/dL — ABNORMAL LOW (ref 3.5–5.0)
Alkaline Phosphatase: 60 U/L (ref 38–126)
Anion gap: 9 (ref 5–15)
BUN: 10 mg/dL (ref 8–23)
CO2: 23 mmol/L (ref 22–32)
Calcium: 8.7 mg/dL — ABNORMAL LOW (ref 8.9–10.3)
Chloride: 98 mmol/L (ref 98–111)
Creatinine, Ser: 0.67 mg/dL (ref 0.44–1.00)
GFR, Estimated: >60 mL/min (ref >60)
Glucose, Bld: 88 mg/dL (ref 70–99)
Potassium: 4.3 mmol/L (ref 3.5–5.1)
Sodium: 130 mmol/L — ABNORMAL LOW (ref 135–145)
Total Bilirubin: 0.8 mg/dL (ref 0.3–1.2)
Total Protein: 5.3 g/dL — ABNORMAL LOW (ref 6.5–8.1)

## 2020-06-14 LAB — SODIUM, URINE, RANDOM: Sodium, Ur: <10 mmol/L

## 2020-06-14 LAB — SARS CORONAVIRUS 2 (TAT 6-24 HRS): SARS Coronavirus 2: NEGATIVE

## 2020-06-14 LAB — CBC
HCT: 35.6 % — ABNORMAL LOW (ref 36.0–46.0)
Hemoglobin: 12.6 g/dL (ref 12.0–15.0)
MCH: 34.9 pg — ABNORMAL HIGH (ref 26.0–34.0)
MCHC: 35.4 g/dL (ref 30.0–36.0)
MCV: 98.6 fL (ref 80.0–100.0)
Platelets: 222 10*3/uL (ref 150–400)
RBC: 3.61 MIL/uL — ABNORMAL LOW (ref 3.87–5.11)
RDW: 14 % (ref 11.5–15.5)
WBC: 10.1 10*3/uL (ref 4.0–10.5)
nRBC: 0 % (ref 0.0–0.2)

## 2020-06-14 LAB — TSH: TSH: 1.711 u[IU]/mL (ref 0.350–4.500)

## 2020-06-14 LAB — MAGNESIUM: Magnesium: 1.1 mg/dL — ABNORMAL LOW (ref 1.7–2.4)

## 2020-06-14 LAB — OSMOLALITY, URINE: Osmolality, Ur: 125 mOsm/kg — ABNORMAL LOW (ref 300–900)

## 2020-06-14 LAB — OSMOLALITY: Osmolality: 274 mOsm/kg — ABNORMAL LOW (ref 275–295)

## 2020-06-14 MED ORDER — OXYBUTYNIN CHLORIDE ER 10 MG PO TB24
10.0000 mg | ORAL_TABLET | Freq: Every day | ORAL | Status: DC
  Administered 2020-06-14 – 2020-06-15 (×2): 10 mg via ORAL
  Filled 2020-06-14 (×2): qty 1

## 2020-06-14 MED ORDER — ACETAMINOPHEN 325 MG PO TABS: 650.0000 mg | ORAL_TABLET | Freq: Four times a day (QID) | ORAL | Status: DC | PRN

## 2020-06-14 MED ORDER — POTASSIUM CHLORIDE 10 MEQ/100ML IV SOLN
10.0000 meq | INTRAVENOUS | Status: AC
  Administered 2020-06-14 (×4): 10 meq via INTRAVENOUS
  Filled 2020-06-14 (×4): qty 100

## 2020-06-14 MED ORDER — ACETAMINOPHEN 650 MG RE SUPP: 650.0000 mg | Freq: Four times a day (QID) | RECTAL | Status: DC | PRN

## 2020-06-14 MED ORDER — LEVETIRACETAM 500 MG PO TABS
1000.0000 mg | ORAL_TABLET | Freq: Two times a day (BID) | ORAL | Status: DC
  Administered 2020-06-14 – 2020-06-15 (×3): 1000 mg via ORAL
  Filled 2020-06-14 (×3): qty 2

## 2020-06-14 MED ORDER — AMLODIPINE BESYLATE 5 MG PO TABS: 5.0000 mg | ORAL_TABLET | Freq: Every day | ORAL | Status: DC

## 2020-06-14 MED ORDER — NICOTINE 7 MG/24HR TD PT24
7.0000 mg | MEDICATED_PATCH | Freq: Every day | TRANSDERMAL | Status: DC
  Filled 2020-06-14 (×2): qty 1

## 2020-06-14 MED ORDER — METOPROLOL SUCCINATE ER 100 MG PO TB24: 100.0000 mg | ORAL_TABLET | Freq: Every day | ORAL | Status: DC

## 2020-06-14 MED ORDER — ONDANSETRON HCL 4 MG PO TABS
4.0000 mg | ORAL_TABLET | Freq: Four times a day (QID) | ORAL | Status: DC | PRN
  Administered 2020-06-14: 4 mg via ORAL
  Filled 2020-06-14: qty 1

## 2020-06-14 MED ORDER — TRAZODONE HCL 50 MG PO TABS
50.0000 mg | ORAL_TABLET | Freq: Every evening | ORAL | Status: DC | PRN
  Administered 2020-06-14 (×2): 50 mg via ORAL
  Filled 2020-06-14 (×2): qty 1

## 2020-06-14 MED ORDER — ENOXAPARIN SODIUM 40 MG/0.4ML ~~LOC~~ SOLN
40.0000 mg | SUBCUTANEOUS | Status: DC
  Administered 2020-06-14 – 2020-06-15 (×2): 40 mg via SUBCUTANEOUS
  Filled 2020-06-14 (×2): qty 0.4

## 2020-06-14 MED ORDER — LORAZEPAM 2 MG/ML IJ SOLN: 1.0000 mg | Freq: Four times a day (QID) | INTRAMUSCULAR | Status: DC | PRN

## 2020-06-14 MED ORDER — FLUTICASONE FUROATE-VILANTEROL 100-25 MCG/INH IN AEPB
1.0000 | INHALATION_SPRAY | Freq: Every day | RESPIRATORY_TRACT | Status: DC
  Filled 2020-06-14: qty 28

## 2020-06-14 MED ORDER — ALBUTEROL SULFATE HFA 108 (90 BASE) MCG/ACT IN AERS
2.0000 | INHALATION_SPRAY | RESPIRATORY_TRACT | Status: DC | PRN
  Filled 2020-06-14: qty 6.7

## 2020-06-14 MED ORDER — ONDANSETRON HCL 4 MG/2ML IJ SOLN: 4.0000 mg | Freq: Four times a day (QID) | INTRAMUSCULAR | Status: DC | PRN

## 2020-06-14 MED ORDER — LACTATED RINGERS IV SOLN: INTRAVENOUS | Status: DC

## 2020-06-14 MED ORDER — FLUTICASONE-UMECLIDIN-VILANT 100-62.5-25 MCG/INH IN AEPB: 1.0000 | INHALATION_SPRAY | Freq: Every morning | RESPIRATORY_TRACT | Status: DC

## 2020-06-14 MED ORDER — UMECLIDINIUM BROMIDE 62.5 MCG/INH IN AEPB
1.0000 | INHALATION_SPRAY | Freq: Every day | RESPIRATORY_TRACT | Status: DC
  Filled 2020-06-14: qty 7

## 2020-06-14 MED ORDER — HYDROCODONE-ACETAMINOPHEN 5-325 MG PO TABS
1.0000 | ORAL_TABLET | Freq: Four times a day (QID) | ORAL | Status: DC | PRN
  Administered 2020-06-14 – 2020-06-15 (×3): 1 via ORAL
  Filled 2020-06-14 (×3): qty 1

## 2020-06-14 NOTE — Procedures (Signed)
Patient Name: IYARI HAGNER  MRN: [id number]  Epilepsy Attending: Lora Havens  Referring Physician/Provider: Dr Harrold Donath Date: 06/14/2020  Duration: 23.43 mins  Patient history: BRYTTANI BLEW is a 75 y.o. female with medical history significant for breast cancer, alcohol abuse and has been sober for the last 6 months, anxiety/depression, hypertension, hyponatremia, seizure in 2020 who presents by EMS after having 2 seizures. EEG to evaluate for seizure  Level of alertness: Awake, asleep  AEDs during EEG study: LEV  Technical aspects: This EEG study was done with scalp electrodes positioned according to the 10-20 International system of electrode placement. Electrical activity was acquired at a sampling rate of 500Hz  and reviewed with a high frequency filter of 70Hz  and a low frequency filter of 1Hz . EEG data were recorded continuously and digitally stored.   Description: The posterior dominant rhythm consists of 8-9 Hz activity of moderate voltage (25-35 uV) seen predominantly in posterior head regions, symmetric and reactive to eye opening and eye closing. Sleep was characterized by vertex waves, sleep spindles (12 to 14 Hz), maximal frontocentral region.  Hyperventilation and photic stimulation were not performed.     IMPRESSION: This study is within normal limits. No seizures or epileptiform discharges were seen throughout the recording.  Daelyn Pettaway [name]

## 2020-06-14 NOTE — Progress Notes (Addendum)
Briefly, patient is a 75 year old female with previous history of alcohol use, hyponatremia and seizures who has been off her seizure medications for several years who was admitted early this morning for recurrent seizures.  Patient states that she has been off alcohol for 6 months however was noted to have hyponatremia with sodium of 123 last night.  Patient was discussed with neurology who recommended initiating Keppra 1000 p.o. twice daily per previous doses and she was been treated with LR at 100 cc overnight.  And an EEG has been done which is negative today.    Patient sodium has improved to 130 today.   Urine osmolality and sodium have been ordered and show a very concentrated urine with low sodium suggestive of intravascular volume depletion.  Will continue hydration with LR as initiated.   Continue Keppra, formal neurology consultation can be ordered if warranted.   Repeat sodium in the morning.

## 2020-06-14 NOTE — Progress Notes (Signed)
EEG complete - results pending. EEG complete - results pending.  

## 2020-06-14 NOTE — H&P (Signed)
History and Physical    Mindy Young DGL:[id number] DOB: November 28, 1945 DOA: 06/13/2020  PCP: Lennie Odor, PA   Patient coming from: Home  Chief Complaint:   Seizure  HPI: Mindy Young is a 75 y.o. female with medical history significant for breast cancer, alcohol abuse and has been sober for the last 6 months, anxiety/depression, hypertension, hyponatremia, seizure in 2020 who presents by EMS after having 2 seizures.  Mindy Young reports that she has been having nausea vomiting and diarrhea for the last 6 to 7 days.  She states that on Saturday she was feeling better and did not have any vomiting and went to dinner with her cousin.  While she was sitting at the restaurant she had to tonic-clonic seizures that lasted for approximately 4 minutes the first time in 2 to 3 minutes the second time.  She was confused and postictal after the seizures and EMS was called.  During transfer to the hospital her mentation improved.  Her initial blood sugar was good.  She did not have any fever.  She denies having any abdominal pain.  States she has not had any recent travel or change in her diet.  States he has not been around anyone who was recently sick with any GI illness.  She denies any urinary frequency or dysuria and she has not had any upper respiratory symptoms.  She reports the diarrhea has been improving and has not had an bout of diarrhea in the last 24 hours.  She has history of alcohol abuse but states she quit 6 months ago after a hospitalization in September 2021 for hyponatremia.  She has not had any head trauma.  When she was discharged in the hospital last September she was on Keppra but when she called out the medication refilled she could not get a refill and so she stopped taking it.  ED Course: Mindy Young is hemodynamically stable and has sodium level of 123.  She has mild elevated white blood cell count of 16,000.  She has no signs of infection and is felt this is left shift from the seizure and  nausea and vomiting.  She has swelling and pain of the right dorsal foot and x-ray shows a possible anterior lateral calcaneus fracture.  ER physician consulted and discussed case with neurology.  Hospitalist service has been asked to admit for further management  Review of Systems:  General: Denies fever, chills, weight loss, night sweats.  Denies dizziness.  HENT: Denies head trauma, headache, denies change in hearing, tinnitus.  Denies nasal congestion or bleeding.  Denies sore throat, sores in mouth.  Denies difficulty swallowing Eyes: Denies blurry vision, pain in eye, drainage.  Denies discoloration of eyes. Neck: Denies pain.  Denies swelling.  Denies pain with movement. Cardiovascular: Denies chest pain, palpitations.  Denies edema.  Denies orthopnea Respiratory: Denies shortness of breath, cough.  Denies wheezing.  Denies sputum production Gastrointestinal: Reports nausea, vomiting and diarrhea over past week. Denies abdominal pain, swelling.   Denies melena.  Denies hematemesis. Musculoskeletal: Reports pain and swelling of right foot. Denies limitation of movement. Denies arthralgias  Genitourinary: Denies pelvic pain.  Denies urinary frequency or hesitancy.  Denies dysuria.  Skin: Denies rash.  Denies petechiae, purpura, ecchymosis. Neurological: Denies headache. Denies paresthesia. Denies slurred speech, drooping face.  Denies visual change. Psychiatric: Denies depression, anxiety. Denies hallucinations.  Past Medical History:  Diagnosis Date  . Alcohol abuse   . Allergy   . Anemia 06/29/2011  . Anxiety   .  Arthritis   . Breast cancer ([place])    right breast  . C1 cervical fracture ([place]) 02/1997  . Cancer ([place])    melanoma  . Depression   . Family history of breast cancer   . Family history of prostate cancer   . GERD (gastroesophageal reflux disease)   . Hypertension   . Tardive dyskinesia     Past Surgical History:  Procedure Laterality Date  . ABDOMINAL HYSTERECTOMY     . ASPIRATION OF ABSCESS Right 11/20/2017   Procedure: ASPIRATION OF RIGHT AXILLARY SEROMA;  Surgeon: Rolm Bookbinder, MD;  Location: [place];  Service: General;  Laterality: Right;  . AUGMENTATION MAMMAPLASTY Bilateral    biateral implants , approx 2015  . BREAST LUMPECTOMY Right 11/02/2017   re-ex 11-20-17  . BREAST LUMPECTOMY WITH RADIOACTIVE SEED AND SENTINEL LYMPH NODE BIOPSY Right 11/02/2017   Procedure: BREAST LUMPECTOMY WITH RADIOACTIVE SEED AND SENTINEL LYMPH NODE BIOPSY;  Surgeon: Rolm Bookbinder, MD;  Location: [facility];  Service: General;  Laterality: Right;  . CHOLECYSTECTOMY    . collarbone    . INCONTINENCE SURGERY    . KNEE SURGERY     removal of cyst, repair of cartiledge  . LAPAROSCOPY     for endometriosis  . RE-EXCISION OF BREAST LUMPECTOMY Right 11/20/2017   Procedure: RE-EXCISION OF RIGHT BREAST MARGINS;  Surgeon: Rolm Bookbinder, MD;  Location: [facility];  Service: General;  Laterality: Right;  . ROTATOR CUFF REPAIR     left    Social History  reports that she has been smoking cigarettes. She has been smoking about 1.00 pack per day. She has never used smokeless tobacco. She reports current alcohol use of about 21.0 standard drinks of alcohol per week. She reports that she does not use drugs.  Allergies  Allergen Reactions  . Percocet [Oxycodone-Acetaminophen] Other (See Comments)    "bugs crawling on me"    Family History  Problem Relation Age of Onset  . COPD Mother   . Prostate cancer Father 57       seed implant for treatment  . Colon polyps Father        'a few'  . Breast cancer Sister 22  . Breast cancer Maternal Aunt        dx >50  . Breast cancer Other 35       bilateral  . Colon cancer Neg Hx      Prior to Admission medications   Medication Sig Start Date End Date Taking? Authorizing Provider  ALPRAZolam Duanne Moron) 0.5 MG tablet Take 0.5 mg by mouth 2 (two) times daily as needed for  anxiety.  08/10/10   [provider]  amLODipine (NORVASC) 5 MG tablet Take 5 mg by mouth daily.    [provider]  cetirizine (ZYRTEC) 10 MG tablet Take 10 mg by mouth as needed for allergies.    [provider]  cloNIDine (CATAPRES) 0.1 MG tablet Take 1 tablet by mouth 2 (two) times daily. 11/07/16   [provider]  diazepam (VALIUM) 5 MG tablet Take 1 by mouth 1 hour  pre-procedure with very light food. May bring 2nd tablet to appointment. 08/06/19   Magnus Sinning, MD  HYDROcodone-acetaminophen (NORCO/VICODIN) 5-325 MG tablet Take 1 tablet by mouth every 6 (six) hours as needed for moderate pain. 12/06/18   Garald Balding, MD  HYDROcodone-acetaminophen (NORCO/VICODIN) 5-325 MG tablet Take 1 tablet by mouth every 6 (six) hours as needed for moderate pain.  12/27/18   Cherylann Ratel, PA-C  HYDROcodone-acetaminophen (NORCO/VICODIN) 5-325 MG tablet Take 1-2 tablets by mouth every 6 (six) hours as needed for moderate pain. 01/02/19   Garald Balding, MD  HYDROcodone-acetaminophen (NORCO/VICODIN) 5-325 MG tablet Take 1 tablet by mouth every 6 (six) hours as needed for moderate pain. 01/15/19   Cherylann Ratel, PA-C  HYDROcodone-acetaminophen (NORCO/VICODIN) 5-325 MG tablet Take 1 tablet by mouth every 6 (six) hours as needed for moderate pain. 01/28/19   Garald Balding, MD  HYDROcodone-acetaminophen (NORCO/VICODIN) 5-325 MG tablet Take 1 tablet by mouth every 6 (six) hours as needed for moderate pain. 02/07/19   Garald Balding, MD  HYDROcodone-acetaminophen (NORCO/VICODIN) 5-325 MG tablet Take 1 tablet by mouth every 6 (six) hours as needed for moderate pain. 07/02/19   Garald Balding, MD  HYDROcodone-acetaminophen (NORCO/VICODIN) 5-325 MG tablet Take 1 tablet by mouth every 6 (six) hours as needed for moderate pain. 07/11/19   Garald Balding, MD  HYDROcodone-acetaminophen (NORCO/VICODIN) 5-325 MG tablet Take 1 tablet by mouth every 6 (six) hours  as needed for moderate pain. 07/24/19   Garald Balding, MD  HYDROcodone-acetaminophen (NORCO/VICODIN) 5-325 MG tablet Take 2 tablets by mouth every 6 (six) hours as needed for moderate pain. 07/25/19 07/24/20  Cherylann Ratel, PA-C  HYDROcodone-acetaminophen (NORCO/VICODIN) 5-325 MG tablet Take 1 tablet by mouth every 6 (six) hours as needed for moderate pain. 08/06/19   Garald Balding, MD  HYDROcodone-acetaminophen (NORCO/VICODIN) 5-325 MG tablet Take 1 tablet by mouth every 6 (six) hours as needed for moderate pain. 08/14/19   Garald Balding, MD  letrozole [facility]) 2.5 MG tablet TAKE 1 TABLET(2.5 MG) BY MOUTH DAILY 06/28/19   Nicholas Lose, MD  levETIRAcetam (KEPPRA) 1000 MG tablet Take 1 tablet (1,000 mg total) by mouth 2 (two) times daily. 04/12/19   Tat, Eustace Quail, DO  levofloxacin (LEVAQUIN) 500 MG tablet Take 500 mg by mouth daily. 06/18/19   [provider]  methylphenidate (RITALIN) 20 MG tablet Take 20 mg by mouth daily.     [provider]  metoprolol succinate (TOPROL-XL) 100 MG 24 hr tablet Take 100 mg by mouth daily.  02/19/18   [provider]  omeprazole (PRILOSEC) 40 MG capsule TAKE 1 CAPSULE BY MOUTH TWICE DAILY 30 MINUTES BEFORE BREAKFAST AND 30 MINUTES BEFORE DINNER Patient taking differently: Take 40 mg by mouth 2 (two) times daily. 30 minutes before breakfast, and 30  minutes before dinner. 10/12/18   Mauri Pole, MD  oxybutynin (DITROPAN-XL) 10 MG 24 hr tablet Take 10 mg by mouth daily.  12/05/16   [provider]  polyethylene glycol (MIRALAX / GLYCOLAX) 17 g packet Take 17 g by mouth daily as needed for moderate constipation. 11/26/18   Oswald Hillock, MD  promethazine (PHENERGAN) 25 MG tablet Take 25 mg by mouth every 6 (six) hours as needed for nausea or vomiting.    [provider]  pyridOXINE (B-6) 100 MG tablet Take 1 tablet (100 mg total) by mouth daily. 12/14/18   Dessa Phi, DO    Physical Exam: Vitals:    06/13/20 2030 06/13/20 2215  BP: (!) 169/99 (!) 125/93  Pulse: (!) 111 (!) 103  Resp: (!) 23 19  Temp: 98.3 F (36.8 C)   TempSrc: Oral   SpO2: 91% 99%    Constitutional: NAD, calm, comfortable Vitals:   06/13/20 2030 06/13/20 2215  BP: (!) 169/99 (!) 125/93  Pulse: (!) 111 (!) 103  Resp: (!) 23 19  Temp: 98.3 F (36.8 C)   TempSrc: Oral   SpO2: 91% 99%   General: WDWN, Alert and oriented x3.  Eyes: EOMI, PERRL, conjunctivae normal.  Sclera nonicteric HENT:  [place]/AT, external ears normal.  Nares patent without epistasis.  Mucous membranes are moist.   Neck: Soft, normal range of motion, supple, no masses, no thyromegaly. Trachea midline Respiratory: Equal breath sounds with diffuse scattered Rales.  No wheezing, no crackles. Normal respiratory effort. No accessory muscle use.  Cardiovascular: Regular rate and rhythm, no murmurs / rubs / gallops. No edema. 2+ pedal pulses. Abdomen: Soft, no tenderness, nondistended, no rebound or guarding.  No masses palpated. Bowel sounds normoactive Musculoskeletal: LROM of right foot due to pain. Right dorsal midfoot with hematoma, swelling and tender to palpation.  no cyanosis. Normal muscle tone.  Skin: Warm, dry, intact no rashes, lesions, ulcers. No induration Neurologic: CN 2-12 grossly intact.  Normal speech. Sensation intact, patella DTR +1 bilaterally. Strength 5/5 in all extremities.   Psychiatric: Normal judgment and insight.  Normal mood.    Labs on Admission: I have personally reviewed following labs and imaging studies  CBC: Recent Labs  Lab 06/13/20 2024  WBC 16.1*  NEUTROABS 11.4*  HGB 13.7  HCT 37.7  MCV 97.7  PLT [id number]    Basic Metabolic Panel: Recent Labs  Lab 06/13/20 2024  NA 123*  K 2.8*  CL 86*  CO2 22  GLUCOSE 164*  BUN 16  CREATININE 1.01*  CALCIUM 8.9    GFR: CrCl cannot be calculated (Unknown ideal weight.).  Liver Function Tests: Recent Labs  Lab 06/13/20 2024  AST 43*  ALT 22  ALKPHOS 76   BILITOT 1.4*  PROT 6.4*  ALBUMIN 3.8    Urine analysis:    Component Value Date/Time   COLORURINE YELLOW 12/09/2018 1942   APPEARANCEUR CLEAR 12/09/2018 1942   LABSPEC <1.005 (L) 12/09/2018 1942   PHURINE 6.0 12/09/2018 1942   GLUCOSEU 100 (A) 12/09/2018 1942   HGBUR NEGATIVE 12/09/2018 1942   BILIRUBINUR NEGATIVE 12/09/2018 1942   KETONESUR NEGATIVE 12/09/2018 1942   PROTEINUR NEGATIVE 12/09/2018 1942   UROBILINOGEN 0.2 06/25/2011 1923   NITRITE NEGATIVE 12/09/2018 1942   LEUKOCYTESUR NEGATIVE 12/09/2018 1942    Radiological Exams on Admission: DG Foot Complete Right  Result Date: 06/13/2020 CLINICAL DATA:  Right-sided hematoma after seizure. EXAM: RIGHT FOOT COMPLETE - 3+ VIEW COMPARISON:  None. FINDINGS: Findings suspicious for anterolateral calcaneal fracture, appreciated only on the AP view. Moderate hallux valgus and degenerative change of the first metatarsal phalangeal joint. The second toe overlaps the first toe. Soft tissue edema overlies the dorsum of the midfoot. IMPRESSION: 1. Findings suspicious for anterolateral calcaneal fracture, appreciated only on the AP view. Soft tissue edema overlies the midfoot. 2. Moderate hallux valgus and degenerative change of the first metatarsophalangeal joint. Second toe overlaps the first toe, which may be acute or chronic. Electronically Signed   By: Keith Rake M.D.   On: 06/13/2020 23:55    Assessment/Plan Principal Problem:   Seizure  Ms. Wanat is admitted to progressive care unit. She is placed on seizure and fall precautions.  Given loading dose of Keppra in ER. Continue with Keppra bid starting in am.  Obtain EEG.  Patient has history of seizures in the past treated with Keppra in 2020.  He states that the medication was never refilled and she has not taken it in over a year.  Active Problems:   Hyponatremia  Sodium level of 123.  Urine osmolality has been ordered and is pending. IV fluid with LR at 100 ml/hr  overnight Recheck in the morning and renal function in morning    Hypertension Monitor blood pressure.  Home medications to be reconciled by pharmacy and resumed    Hypokalemia Patient was given potassium supplementation orally and IV in the emergency room. Magnesium level is being checked and it is low will replete. Continue IV potassium supplementation with her initial level of 2.8. Recheck electrolytes in the morning     Right calcaneus fracture Patient will need orthopedic surgery consult in the morning.  Posterior splint is in to be placed in the emergency room    Cigarette smoker Nicotine patch provided to prevent withdrawals.  Patient advised to discontinue smoking due to health risks    DVT prophylaxis: Lovenox for DVT prophylaxis.  Code Status:   Full code  Family Communication:  Diagnosis and plan discussed with patient and family member who is at bedside.  Verbalized understanding and agrees with plan.  Further recommendations to follow as clinically indicated Disposition Plan:   Patient is from:  Home  Anticipated DC to:  Home  Anticipated DC date:  Anticipate more than 2 midnight stay in the hospital  Anticipated DC barriers: No barriers to discharge identified at this time  Consults called:  Neurology, Dr. Cheral Marker, consulted by ER physician Admission status:  Inpatient   Yevonne Aline Payslee Bateson MD [facility]  How to contact the [facility] Attending or Consulting provider [place] or covering provider during after hours [place], for this patient?   1. Check the care team in [facility] and look for a) attending/consulting [facility] provider listed and b) the [facility] team listed 2. Log into [email or web address] and use [place]'s universal password to access. If you do not have the password, please contact the hospital operator. 3. Locate the [facility] provider you are looking for under [facility] and page to a number that you can be directly reached. 4. If you still have difficulty  reaching the provider, please page the [facility] (Director on Call) for the Hospitalists listed on amion for assistance.  06/14/2020, 12:29 AM

## 2020-06-15 DIAGNOSIS — S92001A Unspecified fracture of right calcaneus, initial encounter for closed fracture: Secondary | ICD-10-CM

## 2020-06-15 LAB — MAGNESIUM: Magnesium: 1.3 mg/dL — ABNORMAL LOW (ref 1.7–2.4)

## 2020-06-15 LAB — BASIC METABOLIC PANEL
Anion gap: 7 (ref 5–15)
BUN: 9 mg/dL (ref 8–23)
CO2: 26 mmol/L (ref 22–32)
Calcium: 8.3 mg/dL — ABNORMAL LOW (ref 8.9–10.3)
Chloride: 100 mmol/L (ref 98–111)
Creatinine, Ser: 0.69 mg/dL (ref 0.44–1.00)
GFR, Estimated: >60 mL/min (ref >60)
Glucose, Bld: 93 mg/dL (ref 70–99)
Potassium: 4 mmol/L (ref 3.5–5.1)
Sodium: 133 mmol/L — ABNORMAL LOW (ref 135–145)

## 2020-06-15 MED ORDER — MAGNESIUM SULFATE 2 GM/50ML IV SOLN
2.0000 g | Freq: Once | INTRAVENOUS | Status: AC
  Administered 2020-06-15: 2 g via INTRAVENOUS
  Filled 2020-06-15: qty 50

## 2020-06-15 MED ORDER — LEVETIRACETAM 1000 MG PO TABS: 1000.0000 mg | ORAL_TABLET | Freq: Two times a day (BID) | ORAL | 0 refills | Status: DC

## 2020-06-15 MED ORDER — PYRIDOXINE HCL 100 MG PO TABS: 100.0000 mg | ORAL_TABLET | Freq: Every day | ORAL | 0 refills | Status: DC

## 2020-06-15 NOTE — Progress Notes (Signed)
Discharge instructions provided to patient. All medications, follow up appointments, and discharge instructions discussed. IV out. Monitor off CCMD notified. Patient discharging to home with daughter.  Era Bumpers, RN

## 2020-06-15 NOTE — Discharge Summary (Addendum)
Physician Discharge Summary  Mindy Young HGD:[id number] DOB: 04-02-1945 DOA: 06/13/2020  PCP: Lennie Odor, PA  Admit date: 06/13/2020 Discharge date: 06/15/2020  Admitted From: Home Disposition: Home  Recommendations for Outpatient Follow-up:  1. Follow up with PCP in 1 week with repeat CBC/BMP 2. Outpatient follow-up with neurology 3. Please do not drive for at least 6 months or till you get clearance from PCP/neurology 4. Comply with medications and follow-up 5. Follow up in ED if symptoms worsen or new appear   Home Health: No Equipment/Devices: None  Discharge Condition: Stable CODE STATUS: Full Diet recommendation: Heart healthy  Brief/Interim Summary: 75 year old female with history of breast cancer, alcohol abuse but has been sober for the last 6 months, anxiety/depression, hypertension, hyponatremia, seizure in 2020 currently not taking any seizure medications presented with seizure.  On presentation, sodium level was 123.  ER physician consulted neurology who recommended resumption of prior home dose of Keppra.  She has tolerated Keppra.  EEG is negative for seizures.  She was treated with IV fluids.  Sodium level is improving.  She was to go home today.  She will be discharged home today with outpatient follow-up with neurology.  Discharge Diagnoses:   Seizure in a patient with history of seizure -Patient has history of seizures and was treated with Keppra and is on 20 but currently not taking any medications because of lack of follow-up. -ER provider discussed with neurology who recommended to resume prior home dose of Keppra.  EEG was negative for seizures.  No further seizures since admission.  Outpatient follow-up with neurology.  Continue Keppra on discharge.  Hyponatremia -Presented with sodium of 123.  Treated with IV fluids.  Sodium level has much improved to 133.  Outpatient follow-up  Hypokalemia -Resolved  Hypomagnesemia -Replace prior to  discharge  Right calcaneus fracture -Posterior splint placed in the emergency room.  Spoke to orthopedics PA/Michael Dellis Filbert who will arrange for outpatient follow-up with orthopedics with Dr.Hewitt  Tobacco use -Patient was counseled regarding tobacco cessation by prior hospitalist.    Discharge Instructions  Discharge Instructions    Ambulatory referral to Neurology   Complete by: As directed    An appointment is requested in approximately: hospital followup in 1-2 weeks for seizure   Diet - low sodium heart healthy   Complete by: As directed    Increase activity slowly   Complete by: As directed      Allergies as of 06/15/2020      Reactions   Percocet [oxycodone-acetaminophen] Other (See Comments)   "bugs crawling on me"      Medication List    STOP taking these medications   letrozole 2.5 MG tablet Commonly known as: FEMARA   promethazine 25 MG tablet Commonly known as: PHENERGAN     TAKE these medications   albuterol 108 (90 Base) MCG/ACT inhaler Commonly known as: VENTOLIN HFA Inhale 2 puffs into the lungs every 4 (four) hours as needed for wheezing or shortness of breath.   cetirizine 10 MG tablet Commonly known as: ZYRTEC Take 10 mg by mouth as needed for allergies.   diphenhydrAMINE 25 MG tablet Commonly known as: BENADRYL Take 25 mg by mouth at bedtime as needed for sleep.   levETIRAcetam 1000 MG tablet Commonly known as: KEPPRA Take 1 tablet (1,000 mg total) by mouth 2 (two) times daily.   Multi Vitamin Daily Tabs Take 1 tablet by mouth every morning.   oxybutynin 10 MG 24 hr tablet Commonly known as: DITROPAN-XL Take  10 mg by mouth daily.   pyridoxine 100 MG tablet Commonly known as: B-6 Take 1 tablet (100 mg total) by mouth daily.   RA Melatonin 10 MG Tabs Generic drug: Melatonin Take 10-20 mg by mouth at bedtime as needed for sleep.   Trelegy Ellipta 100-62.5-25 MCG/INH Aepb Generic drug: Fluticasone-Umeclidin-Vilant Inhale 1 puff  into the lungs every morning.   Trintellix 5 MG Tabs tablet Generic drug: vortioxetine HBr Take 5 mg by mouth daily.   Vitamin D3 50 MCG (2000 UT) capsule Take 2,000 Units by mouth every morning.   Zofran 4 MG tablet Generic drug: ondansetron Take 4 mg by mouth as needed for nausea/vomiting.       Follow-up Information    [facility], [place], [place]. Schedule an appointment as soon as possible for a visit in 1 week(s).   Specialty: Physician Assistant Why: With repeat CBC/BMP Contact information: [address] [place] [address] [phone]        Sherren Mocha, MD .   Specialty: Cardiology Contact information: [phone] [place] [address] [phone]              Allergies  Allergen Reactions  . Percocet [Oxycodone-Acetaminophen] Other (See Comments)    "bugs crawling on me"    Consultations:  Phone consultation with neurology by ER.  Orthopedics will arrange for outpatient orthopedics evaluation and follow-up.   Procedures/Studies: DG Foot Complete Right  Result Date: 06/13/2020 CLINICAL DATA:  Right-sided hematoma after seizure. EXAM: RIGHT FOOT COMPLETE - 3+ VIEW COMPARISON:  None. FINDINGS: Findings suspicious for anterolateral calcaneal fracture, appreciated only on the AP view. Moderate hallux valgus and degenerative change of the first metatarsal phalangeal joint. The second toe overlaps the first toe. Soft tissue edema overlies the dorsum of the midfoot. IMPRESSION: 1. Findings suspicious for anterolateral calcaneal fracture, appreciated only on the AP view. Soft tissue edema overlies the midfoot. 2. Moderate hallux valgus and degenerative change of the first metatarsophalangeal joint. Second toe overlaps the first toe, which may be acute or chronic. Electronically Signed   By: Keith Rake M.D.   On: 06/13/2020 23:55   EEG adult  Result Date: 06/14/2020 Lora Havens, MD     06/14/2020  1:13 PM Patient  Name: Mindy Young MRN: [id number] Epilepsy Attending: Lora Havens Referring Physician/Provider: Dr Harrold Donath Date: 06/14/2020 Duration: 23.43 mins Patient history: Mindy Young is a 75 y.o. female with medical history significant for breast cancer, alcohol abuse and has been sober for the last 6 months, anxiety/depression, hypertension, hyponatremia, seizure in 2020 who presents by EMS after having 2 seizures. EEG to evaluate for seizure Level of alertness: Awake, asleep AEDs during EEG study: LEV Technical aspects: This EEG study was done with scalp electrodes positioned according to the 10-20 International system of electrode placement. Electrical activity was acquired at a sampling rate of 500Hz  and reviewed with a high frequency filter of 70Hz  and a low frequency filter of 1Hz . EEG data were recorded continuously and digitally stored. Description: The posterior dominant rhythm consists of 8-9 Hz activity of moderate voltage (25-35 uV) seen predominantly in posterior head regions, symmetric and reactive to eye opening and eye closing. Sleep was characterized by vertex waves, sleep spindles (12 to 14 Hz), maximal frontocentral region.  Hyperventilation and photic stimulation were not performed.   IMPRESSION: This study is within normal limits. No seizures or epileptiform discharges were seen throughout the recording. Priyanka [name]  Subjective: Patient seen and examined at bedside.  She feels much better and wants to go home.  No overnight fever or vomiting reported.  No seizures reported as well.  Discharge Exam: Vitals:   06/15/20 0352 06/15/20 0730  BP: 130/77 (!) 143/78  Pulse: 78 98  Resp: 16 20  Temp: 98.4 F (36.9 C) 98 F (36.7 C)  SpO2: 97% 95%    General: Pt is alert, awake, not in acute distress Cardiovascular: rate controlled, S1/S2 + Respiratory: bilateral decreased breath sounds at bases Abdominal: Soft, NT, ND, bowel sounds + Extremities: Right lower  extremity swelling and hematoma; no cyanosis    The results of significant diagnostics from this hospitalization (including imaging, microbiology, ancillary and laboratory) are listed below for reference.     Microbiology: Recent Results (from the past 240 hour(s))  SARS CORONAVIRUS 2 (TAT 6-24 HRS) Nasopharyngeal Nasopharyngeal Swab     Status: None   Collection Time: 06/13/20 11:06 PM   Specimen: Nasopharyngeal Swab  Result Value Ref Range Status   SARS Coronavirus 2 NEGATIVE NEGATIVE Final    Comment: (NOTE) SARS-CoV-2 target nucleic acids are NOT DETECTED.  The SARS-CoV-2 RNA is generally detectable in upper and lower respiratory specimens during the acute phase of infection. Negative results do not preclude SARS-CoV-2 infection, do not rule out co-infections with other pathogens, and should not be used as the sole basis for treatment or other patient management decisions. Negative results must be combined with clinical observations, patient history, and epidemiological information. The expected result is Negative.  Fact Sheet for Patients: SugarRoll.be  Fact Sheet for Healthcare Providers: https://www.woods-mathews.com/  This test is not yet approved or cleared by the [place] FDA and  has been authorized for detection and/or diagnosis of SARS-CoV-2 by FDA under an Emergency Use Authorization (EUA). This EUA will remain  in effect (meaning this test can be used) for the duration of the COVID-19 declaration under Se ction 564(b)(1) of the Act, 21 U.S.C. section 360bbb-3(b)(1), unless the authorization is terminated or revoked sooner.  Performed at [facility], [place] [address] [place], [place] [address]      Labs: BNP (last 3 results) No results for input(s): BNP in the last 8760 hours. Basic Metabolic Panel: Recent Labs  Lab 06/13/20 2024 06/14/20 0230 06/14/20 0626 06/15/20 0221  NA 123*  --  130* 133*  K  2.8*  --  4.3 4.0  CL 86*  --  98 100  CO2 22  --  23 26  GLUCOSE 164*  --  88 93  BUN 16  --  10 9  CREATININE 1.01*  --  0.67 0.69  CALCIUM 8.9  --  8.7* 8.3*  MG  --  1.1*  --  1.3*   Liver Function Tests: Recent Labs  Lab 06/13/20 2024 06/14/20 0626  AST 43* 32  ALT 22 17  ALKPHOS 76 60  BILITOT 1.4* 0.8  PROT 6.4* 5.3*  ALBUMIN 3.8 3.2*   Recent Labs  Lab 06/13/20 2024  LIPASE 37   No results for input(s): AMMONIA in the last 168 hours. CBC: Recent Labs  Lab 06/13/20 2024 06/14/20 0626  WBC 16.1* 10.1  NEUTROABS 11.4*  --   HGB 13.7 12.6  HCT 37.7 35.6*  MCV 97.7 98.6  PLT 290 222   Cardiac Enzymes: No results for input(s): CKTOTAL, CKMB, CKMBINDEX, TROPONINI in the last 168 hours. BNP: Invalid input(s): POCBNP CBG: No results for input(s): GLUCAP in the last 168 hours.  D-Dimer No results for input(s): DDIMER in the last 72 hours. Hgb A1c No results for input(s): HGBA1C in the last 72 hours. Lipid Profile No results for input(s): CHOL, HDL, LDLCALC, TRIG, CHOLHDL, LDLDIRECT in the last 72 hours. Thyroid function studies Recent Labs    06/14/20 0626  TSH 1.711   Anemia work up No results for input(s): VITAMINB12, FOLATE, FERRITIN, TIBC, IRON, RETICCTPCT in the last 72 hours. Urinalysis    Component Value Date/Time   COLORURINE YELLOW 12/09/2018 1942   APPEARANCEUR CLEAR 12/09/2018 1942   LABSPEC <1.005 (L) 12/09/2018 1942   PHURINE 6.0 12/09/2018 1942   GLUCOSEU 100 (A) 12/09/2018 1942   HGBUR NEGATIVE 12/09/2018 1942   BILIRUBINUR NEGATIVE 12/09/2018 1942   KETONESUR NEGATIVE 12/09/2018 1942   PROTEINUR NEGATIVE 12/09/2018 1942   UROBILINOGEN 0.2 06/25/2011 1923   NITRITE NEGATIVE 12/09/2018 1942   LEUKOCYTESUR NEGATIVE 12/09/2018 1942   Sepsis Labs Invalid input(s): PROCALCITONIN,  WBC,  LACTICIDVEN Microbiology Recent Results (from the past 240 hour(s))  SARS CORONAVIRUS 2 (TAT 6-24 HRS) Nasopharyngeal Nasopharyngeal Swab      Status: None   Collection Time: 06/13/20 11:06 PM   Specimen: Nasopharyngeal Swab  Result Value Ref Range Status   SARS Coronavirus 2 NEGATIVE NEGATIVE Final    Comment: (NOTE) SARS-CoV-2 target nucleic acids are NOT DETECTED.  The SARS-CoV-2 RNA is generally detectable in upper and lower respiratory specimens during the acute phase of infection. Negative results do not preclude SARS-CoV-2 infection, do not rule out co-infections with other pathogens, and should not be used as the sole basis for treatment or other patient management decisions. Negative results must be combined with clinical observations, patient history, and epidemiological information. The expected result is Negative.  Fact Sheet for Patients: SugarRoll.be  Fact Sheet for Healthcare Providers: https://www.woods-mathews.com/  This test is not yet approved or cleared by the [place] FDA and  has been authorized for detection and/or diagnosis of SARS-CoV-2 by FDA under an Emergency Use Authorization (EUA). This EUA will remain  in effect (meaning this test can be used) for the duration of the COVID-19 declaration under Se ction 564(b)(1) of the Act, 21 U.S.C. section 360bbb-3(b)(1), unless the authorization is terminated or revoked sooner.  Performed at [facility], [place] [address] [place],  [address]      Time coordinating discharge: 35 minutes  SIGNED:   Aline August, MD  [facility] 06/15/2020, 10:06 AM

## 2020-07-02 ENCOUNTER — Other Ambulatory Visit: Payer: Self-pay

## 2020-07-02 ENCOUNTER — Emergency Department (HOSPITAL_COMMUNITY)
Admission: EM | Admit: 2020-07-02 | Discharge: 2020-07-02 | Discharge status: Against Medical Advice | Payer: Medicare Other | Attending: Physician Assistant | Admitting: Physician Assistant

## 2020-07-02 ENCOUNTER — Emergency Department (HOSPITAL_COMMUNITY): Payer: Medicare Other

## 2020-07-02 DIAGNOSIS — R42 Dizziness and giddiness: Secondary | ICD-10-CM | POA: Insufficient documentation

## 2020-07-02 DIAGNOSIS — I700 Atherosclerosis of aorta: Secondary | ICD-10-CM | POA: Insufficient documentation

## 2020-07-02 DIAGNOSIS — R197 Diarrhea, unspecified: Secondary | ICD-10-CM | POA: Insufficient documentation

## 2020-07-02 DIAGNOSIS — Z5321 Procedure and treatment not carried out due to patient leaving prior to being seen by health care provider: Secondary | ICD-10-CM | POA: Diagnosis not present

## 2020-07-02 DIAGNOSIS — R112 Nausea with vomiting, unspecified: Secondary | ICD-10-CM | POA: Insufficient documentation

## 2020-07-02 LAB — CBC WITH DIFFERENTIAL/PLATELET
Abs Immature Granulocytes: 0.03 10*3/uL (ref 0.00–0.07)
Basophils Absolute: 0.1 10*3/uL (ref 0.0–0.1)
Basophils Relative: 1 %
Eosinophils Absolute: 0 10*3/uL (ref 0.0–0.5)
Eosinophils Relative: 0 %
HCT: 39.2 % (ref 36.0–46.0)
Hemoglobin: 13.5 g/dL (ref 12.0–15.0)
Immature Granulocytes: 0 %
Lymphocytes Relative: 24 %
Lymphs Abs: 1.8 10*3/uL (ref 0.7–4.0)
MCH: 34.9 pg — ABNORMAL HIGH (ref 26.0–34.0)
MCHC: 34.4 g/dL (ref 30.0–36.0)
MCV: 101.3 fL — ABNORMAL HIGH (ref 80.0–100.0)
Monocytes Absolute: 0.8 10*3/uL (ref 0.1–1.0)
Monocytes Relative: 11 %
Neutro Abs: 4.8 10*3/uL (ref 1.7–7.7)
Neutrophils Relative %: 64 %
Platelets: 379 10*3/uL (ref 150–400)
RBC: 3.87 MIL/uL (ref 3.87–5.11)
RDW: 13.2 % (ref 11.5–15.5)
WBC: 7.5 10*3/uL (ref 4.0–10.5)
nRBC: 0 % (ref 0.0–0.2)

## 2020-07-02 LAB — COMPREHENSIVE METABOLIC PANEL
ALT: 14 U/L (ref 0–44)
AST: 24 U/L (ref 15–41)
Albumin: 2.9 g/dL — ABNORMAL LOW (ref 3.5–5.0)
Alkaline Phosphatase: 61 U/L (ref 38–126)
Anion gap: 7 (ref 5–15)
BUN: 5 mg/dL — ABNORMAL LOW (ref 8–23)
CO2: 24 mmol/L (ref 22–32)
Calcium: 7.9 mg/dL — ABNORMAL LOW (ref 8.9–10.3)
Chloride: 105 mmol/L (ref 98–111)
Creatinine, Ser: 0.6 mg/dL (ref 0.44–1.00)
GFR, Estimated: >60 mL/min (ref >60)
Glucose, Bld: 111 mg/dL — ABNORMAL HIGH (ref 70–99)
Potassium: 3.4 mmol/L — ABNORMAL LOW (ref 3.5–5.1)
Sodium: 136 mmol/L (ref 135–145)
Total Bilirubin: 0.3 mg/dL (ref 0.3–1.2)
Total Protein: 5.3 g/dL — ABNORMAL LOW (ref 6.5–8.1)

## 2020-07-02 LAB — URINALYSIS, ROUTINE W REFLEX MICROSCOPIC
Bilirubin Urine: NEGATIVE
Glucose, UA: NEGATIVE mg/dL
Hgb urine dipstick: NEGATIVE
Ketones, ur: NEGATIVE mg/dL
Leukocytes,Ua: NEGATIVE
Nitrite: NEGATIVE
Protein, ur: NEGATIVE mg/dL
Specific Gravity, Urine: 1.029 (ref 1.005–1.030)
pH: 6 (ref 5.0–8.0)

## 2020-07-02 LAB — LIPASE, BLOOD: Lipase: 27 U/L (ref 11–51)

## 2020-07-02 MED ORDER — IOHEXOL 300 MG/ML  SOLN
75.0000 mL | Freq: Once | INTRAMUSCULAR | Status: AC | PRN
  Administered 2020-07-02: 75 mL via INTRAVENOUS

## 2020-07-02 NOTE — ED Provider Notes (Signed)
Emergency Medicine Provider Triage Evaluation Note  LIESA TSAN , a 75 y.o. female  was evaluated in triage.  Pt complains of diarrhea x3 weeks. Denies bloody stools, abd pain, fevers. She has had intermittent nausea, vomiting and lightheadedness as well. She was seen at urgent care prior to this and also received abx at that time.  She received abx from her pcp yesterday.  Review of Systems  Positive: Diarrhea, nv Negative: Fever, abd pain, bloody stools  Physical Exam  BP (!) 142/101 (BP Location: Left Arm)   Pulse 98   Temp 98.2 F (36.8 C) (Oral)   Resp 18   SpO2 100%  Gen:   Awake, no distress   Resp:  Normal effort  MSK:   Moves extremities without difficulty  Other:  abd is nontender  Medical Decision Making  Medically screening exam initiated at 3:28 PM.  Appropriate orders placed.  TYSHAWN KEEL was informed that the remainder of the evaluation will be completed by another provider, this initial triage assessment does not replace that evaluation, and the importance of remaining in the ED until their evaluation is complete.    Rodney Booze, PA-C 07/02/20 [place], DO 07/02/20 1547

## 2020-07-02 NOTE — ED Triage Notes (Signed)
Pt presents to triage from home, pt has had diarrhea and vomiting for past three weeks. Pt seen at UC last week and yesterday for same. Pt has received anbx for same. Received a referral for CT scan yesterday.

## 2020-07-03 ENCOUNTER — Other Ambulatory Visit: Payer: Self-pay | Admitting: Student

## 2020-07-06 ENCOUNTER — Encounter (HOSPITAL_BASED_OUTPATIENT_CLINIC_OR_DEPARTMENT_OTHER): Payer: Self-pay | Admitting: *Deleted

## 2020-07-06 ENCOUNTER — Emergency Department (HOSPITAL_BASED_OUTPATIENT_CLINIC_OR_DEPARTMENT_OTHER)
Admission: EM | Admit: 2020-07-06 | Discharge: 2020-07-06 | Disposition: A | Discharge status: Home / Self Care | Payer: Medicare Other | Attending: Emergency Medicine | Admitting: Emergency Medicine

## 2020-07-06 ENCOUNTER — Other Ambulatory Visit: Payer: Self-pay

## 2020-07-06 DIAGNOSIS — I10 Essential (primary) hypertension: Secondary | ICD-10-CM | POA: Diagnosis not present

## 2020-07-06 DIAGNOSIS — R197 Diarrhea, unspecified: Secondary | ICD-10-CM | POA: Diagnosis present

## 2020-07-06 DIAGNOSIS — R000 Tachycardia, unspecified: Secondary | ICD-10-CM | POA: Insufficient documentation

## 2020-07-06 DIAGNOSIS — Z853 Personal history of malignant neoplasm of breast: Secondary | ICD-10-CM | POA: Diagnosis not present

## 2020-07-06 DIAGNOSIS — F17210 Nicotine dependence, cigarettes, uncomplicated: Secondary | ICD-10-CM | POA: Insufficient documentation

## 2020-07-06 DIAGNOSIS — E876 Hypokalemia: Secondary | ICD-10-CM

## 2020-07-06 LAB — URINALYSIS, ROUTINE W REFLEX MICROSCOPIC
Bilirubin Urine: NEGATIVE
Glucose, UA: NEGATIVE mg/dL
Hgb urine dipstick: NEGATIVE
Ketones, ur: 15 mg/dL — AB
Nitrite: NEGATIVE
Protein, ur: NEGATIVE mg/dL
Specific Gravity, Urine: 1.02 (ref 1.005–1.030)
pH: 6 (ref 5.0–8.0)

## 2020-07-06 LAB — URINALYSIS, MICROSCOPIC (REFLEX)

## 2020-07-06 LAB — COMPREHENSIVE METABOLIC PANEL
ALT: 15 U/L (ref 0–44)
AST: 25 U/L (ref 15–41)
Albumin: 3.4 g/dL — ABNORMAL LOW (ref 3.5–5.0)
Alkaline Phosphatase: 62 U/L (ref 38–126)
Anion gap: 10 (ref 5–15)
BUN: 9 mg/dL (ref 8–23)
CO2: 25 mmol/L (ref 22–32)
Calcium: 8.5 mg/dL — ABNORMAL LOW (ref 8.9–10.3)
Chloride: 98 mmol/L (ref 98–111)
Creatinine, Ser: 0.74 mg/dL (ref 0.44–1.00)
GFR, Estimated: >60 mL/min (ref >60)
Glucose, Bld: 96 mg/dL (ref 70–99)
Potassium: 3.3 mmol/L — ABNORMAL LOW (ref 3.5–5.1)
Sodium: 133 mmol/L — ABNORMAL LOW (ref 135–145)
Total Bilirubin: 0.3 mg/dL (ref 0.3–1.2)
Total Protein: 6.4 g/dL — ABNORMAL LOW (ref 6.5–8.1)

## 2020-07-06 LAB — CBC
HCT: 42.8 % (ref 36.0–46.0)
Hemoglobin: 15.1 g/dL — ABNORMAL HIGH (ref 12.0–15.0)
MCH: 35.7 pg — ABNORMAL HIGH (ref 26.0–34.0)
MCHC: 35.3 g/dL (ref 30.0–36.0)
MCV: 101.2 fL — ABNORMAL HIGH (ref 80.0–100.0)
Platelets: 386 10*3/uL (ref 150–400)
RBC: 4.23 MIL/uL (ref 3.87–5.11)
RDW: 13.2 % (ref 11.5–15.5)
WBC: 10.9 10*3/uL — ABNORMAL HIGH (ref 4.0–10.5)
nRBC: 0 % (ref 0.0–0.2)

## 2020-07-06 LAB — C DIFFICILE QUICK SCREEN W PCR REFLEX
C Diff antigen: NEGATIVE
C Diff interpretation: NOT DETECTED
C Diff toxin: NEGATIVE

## 2020-07-06 LAB — LIPASE, BLOOD: Lipase: 33 U/L (ref 11–51)

## 2020-07-06 LAB — MAGNESIUM: Magnesium: 1.1 mg/dL — ABNORMAL LOW (ref 1.7–2.4)

## 2020-07-06 MED ORDER — SODIUM CHLORIDE 0.9 % IV BOLUS
1000.0000 mL | Freq: Once | INTRAVENOUS | Status: AC
  Administered 2020-07-06: 1000 mL via INTRAVENOUS

## 2020-07-06 MED ORDER — MAGNESIUM OXIDE -MG SUPPLEMENT 400 (240 MG) MG PO TABS
800.0000 mg | ORAL_TABLET | Freq: Once | ORAL | Status: AC
  Administered 2020-07-06: 800 mg via ORAL
  Filled 2020-07-06: qty 2

## 2020-07-06 MED ORDER — POTASSIUM CHLORIDE CRYS ER 20 MEQ PO TBCR: 20.0000 meq | EXTENDED_RELEASE_TABLET | Freq: Two times a day (BID) | ORAL | 0 refills | Status: DC

## 2020-07-06 MED ORDER — ONDANSETRON HCL 4 MG/2ML IJ SOLN
4.0000 mg | Freq: Once | INTRAMUSCULAR | Status: AC
  Administered 2020-07-06: 4 mg via INTRAVENOUS
  Filled 2020-07-06: qty 2

## 2020-07-06 MED ORDER — MAGNESIUM CHLORIDE 64 MG PO TBEC: 2.0000 | DELAYED_RELEASE_TABLET | Freq: Two times a day (BID) | ORAL | 0 refills | Status: AC

## 2020-07-06 MED ORDER — MAGNESIUM CHLORIDE 64 MG PO TBEC: 2.0000 | DELAYED_RELEASE_TABLET | Freq: Once | ORAL | Status: DC

## 2020-07-06 MED ORDER — FENTANYL CITRATE (PF) 100 MCG/2ML IJ SOLN
50.0000 ug | Freq: Once | INTRAMUSCULAR | Status: AC
  Administered 2020-07-06: 50 ug via INTRAVENOUS
  Filled 2020-07-06: qty 2

## 2020-07-06 NOTE — ED Provider Notes (Signed)
[facility] EMERGENCY DEPARTMENT Provider Note   CSN: [id number] Arrival date & time: 07/06/20  1245     History Chief Complaint  Patient presents with  . Diarrhea    Mindy Young is a 75 y.o. female.  She had lab work done 4 days ago that revealed a low magnesium but otherwise was relatively normal.  The history is provided by the patient.  Diarrhea Quality:  Copious ("like bile") Severity:  Severe Onset quality:  Sudden Number of episodes:  7 per day Duration:  1 month Timing:  Intermittent Progression:  Unchanged Relieved by:  Nothing Worsened by:  Nothing Ineffective treatments: antibiotics given to her by her PCP. Associated symptoms: no abdominal pain, no arthralgias, no chills, no fever, no myalgias and no vomiting   Risk factors: no sick contacts and no suspicious food intake        Past Medical History:  Diagnosis Date  . Alcohol abuse   . Allergy   . Anemia 06/29/2011  . Anxiety   . Arthritis   . Breast cancer ([place])    right breast  . C1 cervical fracture ([place]) 02/1997  . Cancer ([place])    melanoma  . Depression   . Family history of breast cancer   . Family history of prostate cancer   . GERD (gastroesophageal reflux disease)   . Hypertension   . Tardive dyskinesia     Patient Active Problem List   Diagnosis Date Noted  . Hypokalemia 06/14/2020  . Seizure ([place]) 06/13/2020  . Impingement syndrome of left shoulder 03/27/2019  . Pain in left shoulder 02/21/2019  . Rib pain on right side 02/21/2019  . Low back pain 02/07/2019  . GERD (gastroesophageal reflux disease) 12/09/2018  . Anxiety 12/09/2018  . Difficulty speaking 12/09/2018  . Leukocytosis 12/09/2018  . Seizure-like activity ([place]) 12/09/2018  . Pelvic fracture ([place]) 11/25/2018  . Pubic bone fracture ([place]) 11/24/2018  . Left knee pain 03/29/2018  . Unilateral primary osteoarthritis, left knee 03/29/2018  . Genetic testing 01/24/2018  . Family history of breast cancer   .  Family history of prostate cancer   . Malignant neoplasm of lower-inner quadrant of right breast of female, estrogen receptor positive ([place]) 10/24/2017  . Anemia 06/29/2011  . Hypertension   . Hyponatremia 06/25/2011  . Transaminitis 06/25/2011  . Alcohol abuse 06/25/2011  . Cough 06/25/2011  . Cigarette smoker 06/25/2011    Past Surgical History:  Procedure Laterality Date  . ABDOMINAL HYSTERECTOMY    . ASPIRATION OF ABSCESS Right 11/20/2017   Procedure: ASPIRATION OF RIGHT AXILLARY SEROMA;  Surgeon: Rolm Bookbinder, MD;  Location: [facility];  Service: General;  Laterality: Right;  . AUGMENTATION MAMMAPLASTY Bilateral    biateral implants , approx 2015  . BREAST LUMPECTOMY Right 11/02/2017   re-ex 11-20-17  . BREAST LUMPECTOMY WITH RADIOACTIVE SEED AND SENTINEL LYMPH NODE BIOPSY Right 11/02/2017   Procedure: BREAST LUMPECTOMY WITH RADIOACTIVE SEED AND SENTINEL LYMPH NODE BIOPSY;  Surgeon: Rolm Bookbinder, MD;  Location: [facility];  Service: General;  Laterality: Right;  . CHOLECYSTECTOMY    . collarbone    . INCONTINENCE SURGERY    . KNEE SURGERY     removal of cyst, repair of cartiledge  . LAPAROSCOPY     for endometriosis  . RE-EXCISION OF BREAST LUMPECTOMY Right 11/20/2017   Procedure: RE-EXCISION OF RIGHT BREAST MARGINS;  Surgeon: Rolm Bookbinder, MD;  Location: [facility];  Service: General;  Laterality: Right;  .  ROTATOR CUFF REPAIR     left     OB History   No obstetric history on file.     Family History  Problem Relation Age of Onset  . COPD Mother   . Prostate cancer Father 57       seed implant for treatment  . Colon polyps Father        'a few'  . Breast cancer Sister 53  . Breast cancer Maternal Aunt        dx >50  . Breast cancer Other 35       bilateral  . Colon cancer Neg Hx     Social History   Tobacco Use  . Smoking status: Current Every Day Smoker    Packs/day: 1.00    Types: Cigarettes   . Smokeless tobacco: Never Used  . Tobacco comment: e-cigs  Vaping Use  . Vaping Use: Some days  Substance Use Topics  . Alcohol use: Yes    Alcohol/week: 21.0 standard drinks    Types: 21 Standard drinks or equivalent per week    Comment: 2 per weekend day; denies during the week (not consistent with prior hx)  . Drug use: No    Home Medications Prior to Admission medications   Medication Sig Start Date End Date Taking? Authorizing Provider  albuterol (VENTOLIN HFA) 108 (90 Base) MCG/ACT inhaler Inhale 2 puffs into the lungs every 4 (four) hours as needed for wheezing or shortness of breath. 04/03/20   [provider]  cetirizine (ZYRTEC) 10 MG tablet Take 10 mg by mouth as needed for allergies.    [provider]  Cholecalciferol (VITAMIN D3) 50 MCG (2000 UT) capsule Take 2,000 Units by mouth every morning. 04/03/20   [provider]  diphenhydrAMINE (BENADRYL) 25 MG tablet Take 25 mg by mouth at bedtime as needed for sleep.    [provider]  levETIRAcetam (KEPPRA) 1000 MG tablet Take 1 tablet (1,000 mg total) by mouth 2 (two) times daily. 06/15/20   Aline August, MD  Multiple Vitamin (MULTI VITAMIN DAILY) TABS Take 1 tablet by mouth every morning. 04/03/20   [provider]  oxybutynin (DITROPAN-XL) 10 MG 24 hr tablet Take 10 mg by mouth daily.  12/05/16   [provider]  pyridoxine (B-6) 100 MG tablet Take 1 tablet (100 mg total) by mouth daily. 06/15/20   Aline August, MD  RA MELATONIN 10 MG TABS Take 10-20 mg by mouth at bedtime as needed for sleep. 03/29/20   [provider]  TRELEGY ELLIPTA 100-62.5-25 MCG/INH AEPB Inhale 1 puff into the lungs every morning. 04/03/20   [provider]  TRINTELLIX 5 MG TABS tablet Take 5 mg by mouth daily. 05/13/20   [provider]  ZOFRAN 4 MG tablet Take 4 mg by mouth as needed for nausea/vomiting. 04/03/20   [provider]    Allergies    Percocet  [oxycodone-acetaminophen]  Review of Systems   Review of Systems  Constitutional: Positive for unexpected weight change. Negative for chills and fever.       Notes significant weight loss for the past several months  HENT: Negative for ear pain and sore throat.   Eyes: Negative for pain and visual disturbance.  Respiratory: Negative for cough and shortness of breath.   Cardiovascular: Negative for chest pain and palpitations.  Gastrointestinal: Positive for diarrhea. Negative for abdominal pain and vomiting.  Genitourinary: Negative for dysuria and hematuria.  Musculoskeletal: Negative for arthralgias, back pain and  myalgias.  Skin: Negative for color change and rash.  Neurological: Negative for seizures and syncope.  All other systems reviewed and are negative.   Physical Exam Updated Vital Signs BP (!) 165/107 (BP Location: Right Arm)   Pulse 99   Temp 98.5 F (36.9 C) (Oral)   Resp 18   Ht 5\' 2"  (1.575 m)   Wt 61.1 kg   SpO2 98%   BMI 24.64 kg/m   Physical Exam Vitals and nursing note reviewed.  Constitutional:      General: She is not in acute distress.    Appearance: She is well-developed.  HENT:     Head: Normocephalic and atraumatic.     Mouth/Throat:     Mouth: Mucous membranes are dry.  Eyes:     Conjunctiva/sclera: Conjunctivae normal.  Cardiovascular:     Rate and Rhythm: Regular rhythm. Tachycardia present.     Heart sounds: No murmur heard.   Pulmonary:     Effort: Pulmonary effort is normal. No respiratory distress.     Breath sounds: Normal breath sounds.  Abdominal:     Palpations: Abdomen is soft.     Tenderness: There is no abdominal tenderness.  Musculoskeletal:     Cervical back: Neck supple.  Skin:    General: Skin is warm and dry.  Neurological:     General: No focal deficit present.     Mental Status: She is alert.  Psychiatric:        Mood and Affect: Mood normal.        Behavior: Behavior normal.        Thought Content: Thought  content normal.     ED Results / Procedures / Treatments   Labs (all labs ordered are listed, but only abnormal results are displayed) Labs Reviewed  COMPREHENSIVE METABOLIC PANEL - Abnormal; Notable for the following components:      Result Value   Sodium 133 (*)    Potassium 3.3 (*)    Calcium 8.5 (*)    Total Protein 6.4 (*)    Albumin 3.4 (*)    All other components within normal limits  CBC - Abnormal; Notable for the following components:   WBC 10.9 (*)    Hemoglobin 15.1 (*)    MCV 101.2 (*)    MCH 35.7 (*)    All other components within normal limits  URINALYSIS, ROUTINE W REFLEX MICROSCOPIC - Abnormal; Notable for the following components:   APPearance CLOUDY (*)    Ketones, ur 15 (*)    Leukocytes,Ua SMALL (*)    All other components within normal limits  MAGNESIUM - Abnormal; Notable for the following components:   Magnesium 1.1 (*)    All other components within normal limits  URINALYSIS, MICROSCOPIC (REFLEX) - Abnormal; Notable for the following components:   Bacteria, UA FEW (*)    All other components within normal limits  C DIFFICILE QUICK SCREEN W PCR REFLEX  GASTROINTESTINAL PANEL BY PCR, STOOL (REPLACES STOOL CULTURE)  LIPASE, BLOOD    EKG None  Radiology No results found.  Procedures Procedures   Medications Ordered in ED Medications  sodium chloride 0.9 % bolus 1,000 mL (has no administration in time range)    ED Course  I have reviewed the triage vital signs and the nursing notes.  Pertinent labs & imaging results that were available during my care of the patient were reviewed by me and considered in my medical decision making (see chart for details).    MDM  Rules/Calculators/A&P                          JADE BURKARD presents with diarrhea x1 month.  She was noted to be slightly tachycardic and was given some IV fluid with improvement in her heart rate.  Otherwise, her exam was normal, and her abdomen was soft, nontender, and not  suggestive of serious intra-abdominal pathology.  She was evaluated at the emergency department about 4 days ago, and she had a CT scan performed that revealed possible ascending cholangitis.  She left prior to full evaluation, and I did not feel compelled to repeat the CT scan today.  She has no elevation in her liver enzymes.  Abdominal exam is nontender.  Clinical features are not consistent with this diagnosis.  The patient did have a low magnesium and a slightly low potassium.  We talked at length about oral rehydration and electrolyte supplementation.  She will take magnesium and potassium, and she will follow-up with her primary care doctor in 1 week for repeat blood work.  She is established with GI, and I have recommended that she see her GI doctor.  Finally, stool studies are pending.  If she requires treatment for C. difficile or other pathogen, this will be prescribed.  She was feeling better at discharge and was able to go home. Final Clinical Impression(s) / ED Diagnoses Final diagnoses:  Diarrhea, unspecified type  Hypomagnesemia  Hypokalemia    Rx / DC Orders ED Discharge Orders         Ordered    magnesium chloride (SLOW-MAG) 64 MG TBEC SR tablet  2 times daily        07/06/20 1850    potassium chloride SA (KLOR-CON) 20 MEQ tablet  2 times daily        07/06/20 1850           Arnaldo Natal, MD 07/06/20 [phone]

## 2020-07-06 NOTE — ED Triage Notes (Signed)
Diarrhea for a month. She feels dehydrated. She had a CT scan abdomen and blood work last week. Her MD is aware of the diarrhea.

## 2020-07-06 NOTE — ED Notes (Signed)
Asked to see before triage, SpO2 97-99% on r/a, HR 110-115, RR 24.  Hx COPD NP cough

## 2020-07-07 LAB — GASTROINTESTINAL PANEL BY PCR, STOOL (REPLACES STOOL CULTURE)

## 2020-07-09 ENCOUNTER — Telehealth: Payer: Self-pay | Admitting: Gastroenterology

## 2020-07-09 NOTE — Telephone Encounter (Signed)
Pt called requested to be seen sooner than 07/29/20. She was at the ED 07/06/20 stated that her symptoms have gotten worse and she can't wait. Please give her a call. Thank you

## 2020-07-09 NOTE — Progress Notes (Deleted)
Assessment/Plan:   ***1.  Seizure  -Quite sometime ago, she had seizures with an abnormal EEG, demonstrating left temporal seizure.  The seizures were in the setting of hyponatremia.  Once again, she presented to the hospital recently with hyponatremia (although not terribly so).  Her history is also quite complicated by the fact that she is an alcoholic, and I suspect that alcohol withdrawal seizure could be playing a role here to, if not the primary role.   Subjective:   Mindy Young was seen today in follow up for seizure.  My previous records as well as any outside records available were reviewed prior to todays visit.  I have not seen the patient in about a year and a half.  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  Patient is a 75 year old female with a history of alcohol abuse, seizure, noncompliance with medication and follow-up.  Patient was admitted to the hospital on April 23 with seizure, not taking her Keppra.  Sodium level was 123.  Fluids were given and discharge level sodium was 133.  Routine EEG on June 14, 2020 was normal.  At that admission, she reported that she had had no alcohol for 6 months to 1 year.  However, records from care everywhere patient was in a detox center in February, 2022 in [place].  On discharge from her April admission, she was discharged on Keppra, 1000 mg twice per day.  She was back in the emergency room on May 12, but this was for nonneurologic reason (diarrhea for 3 weeks).  Prior notes re: seizure in 2021:  She was in the hospital in October with seizure.  Patient was at the emergency room the day prior to admission.  She was there for pain in the hip following a fall as well as constipation.  She was evaluated and sent home.  The home health care aide check on her around 11 am, and thought she was fine and around 1130, she had an episode where she could not bring out her words and she was staring into space.   She did not lose consciousness, but was not necessarily responding to questions.  This apparently happened several times.  She was transferred from the [facility] to [facility], and while in route she had an episode in the ambulance where she had left-sided gaze preference and was not responding with the eyes open.  She had weakness on the left side.  She was able to follow commands with the right side of the body.  She apparently also had a similar episode while in the emergency room.  Sodium was very low at 121.  She had an EEG.  She had multiple episodes during the EEG.  Episodes correlated with left temporal seizures.  She saw neurology/epilepsy at the hospital.  It was felt that these could have been provoked in the setting of hyponatremia.  However, because of the frequency of the seizures, AEDs were initiated.  She was started on Keppra, as well as pyridoxine.  It was noted that once the hyponatremia was corrected, the patient did not have any further seizures, it was possible that the patient could be weaned off her AED.  Patient was discharged on Keppra, 1000 mg twice per day.  She states that she is not taking as directed.  She is only taking q day for the last week or so. Compliance is better when caregivers around.  Caregivers have noted  that mood/agitation better but she has recently cut out lots of medication, not just the keppra.   She was told to follow-up here.  She actually had several visits which she canceled.  Pt states that she has had no alcohol since 09/2018.     PREVIOUS MEDICATIONS: {[facility]inson's RX:18200}  CURRENT MEDICATIONS:  Outpatient Encounter Medications as of 07/10/2020  Medication Sig  . albuterol (VENTOLIN HFA) 108 (90 Base) MCG/ACT inhaler Inhale 2 puffs into the lungs every 4 (four) hours as needed for wheezing or shortness of breath.  . cetirizine (ZYRTEC) 10 MG tablet Take 10 mg by mouth as needed for allergies.  . Cholecalciferol (VITAMIN D3) 50 MCG (2000  UT) capsule Take 2,000 Units by mouth every morning.  . diphenhydrAMINE (BENADRYL) 25 MG tablet Take 25 mg by mouth at bedtime as needed for sleep.  Marland Kitchen levETIRAcetam (KEPPRA) 1000 MG tablet Take 1 tablet (1,000 mg total) by mouth 2 (two) times daily.  . magnesium chloride (SLOW-MAG) 64 MG TBEC SR tablet Take 2 tablets (128 mg total) by mouth 2 (two) times daily for 7 days.  . Multiple Vitamin (MULTI VITAMIN DAILY) TABS Take 1 tablet by mouth every morning.  Marland Kitchen oxybutynin (DITROPAN-XL) 10 MG 24 hr tablet Take 10 mg by mouth daily.   . potassium chloride SA (KLOR-CON) 20 MEQ tablet Take 1 tablet (20 mEq total) by mouth 2 (two) times daily for 7 days.  . pyridoxine (B-6) 100 MG tablet Take 1 tablet (100 mg total) by mouth daily.  Marland Kitchen RA MELATONIN 10 MG TABS Take 10-20 mg by mouth at bedtime as needed for sleep.  . TRELEGY ELLIPTA 100-62.5-25 MCG/INH AEPB Inhale 1 puff into the lungs every morning.  . TRINTELLIX 5 MG TABS tablet Take 5 mg by mouth daily.  Marland Kitchen ZOFRAN 4 MG tablet Take 4 mg by mouth as needed for nausea/vomiting.   No facility-administered encounter medications on file as of 07/10/2020.     Objective:   PHYSICAL EXAMINATION:    VITALS:  There were no vitals filed for this visit.  GEN:  The patient appears stated age and is in NAD. HEENT:  Normocephalic, atraumatic.  The mucous membranes are moist. The superficial temporal arteries are without ropiness or tenderness. CV:  RRR Lungs:  CTAB Neck/HEME:  There are no carotid bruits bilaterally.  Neurological examination:  Orientation: The patient is alert and oriented x3. Cranial nerves: There is good facial symmetry.The speech is fluent and clear. Soft palate rises symmetrically and there is no tongue deviation. Hearing is intact to conversational tone. Sensation: Sensation is intact to light touch throughout Motor: Strength is at least antigravity x4.  Movement examination: Tone: There is normal tone in the UE/LE Abnormal  movements:  no tremor.  No myoclonus.  No asterixis.   Coordination:  There is no decremation with RAM's. Gait and Station: The patient has no difficulty arising out of a deep-seated chair without the use of the hands. The patient's stride length is good.      ***Total time spent on today's visit was *** minutes, including both face-to-face time and nonface-to-face time.  Time included that spent on review of records (prior notes available to me/labs/imaging if pertinent), discussing treatment and goals, answering patient's questions and coordinating care.  Cc:  Cipriano Mile, NP

## 2020-07-10 ENCOUNTER — Ambulatory Visit: Payer: Medicare Other | Admitting: Neurology

## 2020-07-10 NOTE — Telephone Encounter (Signed)
Patient is following up on phone call. Stating she went to [facility] 5/19 and stated they told her to come back here and the doctor there supposedly sent Dr. Silverio Decamp a message letting her know the urgency in this case.

## 2020-07-10 NOTE — Telephone Encounter (Signed)
Spoke with the patient. No earlier openings. She is going to her PCP for her B12 injection today. She is continuing to have diarrhea. Per ED note she is to have follow up labs through her PCP. I have asked her to talk with the nurse about that today at her B12 appointment.  Patient is placed on our cancellation list for an earlier appointment.

## 2020-07-29 ENCOUNTER — Other Ambulatory Visit: Payer: Self-pay | Admitting: Hematology and Oncology

## 2020-07-29 ENCOUNTER — Encounter: Payer: Self-pay | Admitting: Physician Assistant

## 2020-07-29 ENCOUNTER — Ambulatory Visit: Payer: Medicare Other | Admitting: Physician Assistant

## 2020-07-29 VITALS — BP 130/86 | HR 124 | Ht 62.0 in | Wt 133.8 lb

## 2020-07-29 DIAGNOSIS — Z1212 Encounter for screening for malignant neoplasm of rectum: Secondary | ICD-10-CM

## 2020-07-29 DIAGNOSIS — Z1211 Encounter for screening for malignant neoplasm of colon: Secondary | ICD-10-CM

## 2020-07-29 DIAGNOSIS — Z87898 Personal history of other specified conditions: Secondary | ICD-10-CM

## 2020-07-29 DIAGNOSIS — R197 Diarrhea, unspecified: Secondary | ICD-10-CM | POA: Diagnosis not present

## 2020-07-29 NOTE — Progress Notes (Signed)
Chief Complaint: Diarrhea  HPI:    Mindy Young is a 75 year old female with a past medical history of alcohol abuse, breast cancer, depression, reflux and tardive dyskinesia, known to Dr. Silverio Decamp, who was referred to me by Cipriano Mile, NP for a complaint of diarrhea.      08/2010 colonoscopy with 2 diminutive caliber plastic polyps removed from the rectum, small internal hemorrhoids and otherwise normal.  Repeat recommended in 10 years.    12/08/2016 office visit with Dr. Silverio Decamp for nausea, vomiting, IBS, weight loss and intermittent diarrhea.  At that time discussed patient was postcholecystectomy.  Had projectile vomiting.  At that time patient scheduled for EGD.  She was trialed on IBgard 1 capsule up to 3 times a day as needed.    12/09/2016 EGD with a small hiatal hernia and gastritis.  She started on pantoprazole 40 twice daily.  Pathology showed mild gastropathy and patchy infiltrates as discussed and need to rule out gluten intolerance or NSAID injury.  (It does not look like celiac testing was ever done)    06/13/2020 patient admitted to the hospital for a seizure.  She was not on medicine and started on Keppra.  She was post to follow with neurology in late May but missed this appointment.    07/02/2020 CT abdomen pelvis with contrast showed intra and extrahepatic bile ducts which are mildly more dilated in the interval with wall enhancement in the central intra and extrahepatic bile ducts which may be mild wall thickening concerning for possible cholangitis.    07/06/2020 patient seen in the ED for diarrhea.  At that time had been seen 4 days prior but left prior to being diagnosed.  Labs showed a potassium low at 3.3, sodium low at 133.  White count 10.9.  Urinalysis cloudy with some leukocytes, magnesium low.  C. difficile GI path panel and lipase normal.    Today, patient presents to clinic accompanied by her caretaker and tells me that in February she was diagnosed with COVID while down  in [place] and was in the hospital for a week and on various antibiotics and medicines.  Ever since then she has had trouble with her GI system noting that she has had an increase in diarrhea with minimal abdominal pain.  Tells me that sometimes she would be having a bowel movement every 30 minutes, but over the past month this has slowed and she may have a day or 2 which are slightly better and then have another day with lots of bowel movements.  Over the past 2 to 3 days she actually was having more normal stools until this morning when she had more diarrhea.  When she does have diarrhea she uses Imodium which seems to slow things down as pretty well.  Associated symptoms include some nausea which is "pretty often", as well as vomiting but this has not occurred recently.    Denies fever, chills or blood in her stool.  Past Medical History:  Diagnosis Date  . Alcohol abuse   . Allergy   . Anemia 06/29/2011  . Anxiety   . Arthritis   . Breast cancer ([place])    right breast  . C1 cervical fracture ([place]) 02/1997  . Cancer ([place])    melanoma  . Depression   . Family history of breast cancer   . Family history of prostate cancer   . GERD (gastroesophageal reflux disease)   . Hypertension   . Tardive dyskinesia     Past  Surgical History:  Procedure Laterality Date  . ABDOMINAL HYSTERECTOMY    . ASPIRATION OF ABSCESS Right 11/20/2017   Procedure: ASPIRATION OF RIGHT AXILLARY SEROMA;  Surgeon: Rolm Bookbinder, MD;  Location: [facility];  Service: General;  Laterality: Right;  . AUGMENTATION MAMMAPLASTY Bilateral    biateral implants , approx 2015  . BREAST LUMPECTOMY Right 11/02/2017   re-ex 11-20-17  . BREAST LUMPECTOMY WITH RADIOACTIVE SEED AND SENTINEL LYMPH NODE BIOPSY Right 11/02/2017   Procedure: BREAST LUMPECTOMY WITH RADIOACTIVE SEED AND SENTINEL LYMPH NODE BIOPSY;  Surgeon: Rolm Bookbinder, MD;  Location: [facility];  Service: General;  Laterality:  Right;  . CHOLECYSTECTOMY    . collarbone    . INCONTINENCE SURGERY    . KNEE SURGERY     removal of cyst, repair of cartiledge  . LAPAROSCOPY     for endometriosis  . RE-EXCISION OF BREAST LUMPECTOMY Right 11/20/2017   Procedure: RE-EXCISION OF RIGHT BREAST MARGINS;  Surgeon: Rolm Bookbinder, MD;  Location: [facility];  Service: General;  Laterality: Right;  . ROTATOR CUFF REPAIR     left    Current Outpatient Medications  Medication Sig Dispense Refill  . ALPRAZolam (XANAX) 0.5 MG tablet Take 0.5 mg by mouth 2 (two) times daily as needed.    . cetirizine (ZYRTEC) 10 MG tablet Take 10 mg by mouth as needed for allergies.    . Cholecalciferol (VITAMIN D3) 50 MCG (2000 UT) capsule Take 2,000 Units by mouth every morning.    . diphenhydrAMINE (BENADRYL) 25 MG tablet Take 25 mg by mouth at bedtime as needed for sleep.    Marland Kitchen levETIRAcetam (KEPPRA) 1000 MG tablet Take 1 tablet (1,000 mg total) by mouth 2 (two) times daily. 60 tablet 0  . loperamide (IMODIUM) 2 MG capsule Take by mouth.    Marland Kitchen MAGNESIUM-OXIDE 400 (240 Mg) MG tablet Take 1 tablet by mouth daily.    . mirtazapine (REMERON) 15 MG tablet Take 15 mg by mouth at bedtime.    . Multiple Vitamin (MULTI VITAMIN DAILY) TABS Take 1 tablet by mouth every morning.    Marland Kitchen oxybutynin (DITROPAN-XL) 10 MG 24 hr tablet Take 10 mg by mouth daily.   0  . pantoprazole (PROTONIX) 40 MG tablet Take 1 tablet by mouth daily.    Marland Kitchen pyridoxine (B-6) 100 MG tablet Take 1 tablet (100 mg total) by mouth daily. 30 tablet 0  . RA MELATONIN 10 MG TABS Take 10-20 mg by mouth at bedtime as needed for sleep.    . TRINTELLIX 5 MG TABS tablet Take 5 mg by mouth daily.    Marland Kitchen ZOFRAN 4 MG tablet Take 4 mg by mouth as needed for nausea/vomiting.    . potassium chloride SA (KLOR-CON) 20 MEQ tablet Take 1 tablet (20 mEq total) by mouth 2 (two) times daily for 7 days. 14 tablet 0  . TRELEGY ELLIPTA 100-62.5-25 MCG/INH AEPB Inhale 1 puff into the lungs every  morning. (Patient not taking: Reported on 07/29/2020)     No current facility-administered medications for this visit.    Allergies as of 07/29/2020 - Review Complete 07/29/2020  Allergen Reaction Noted  . Percocet [oxycodone-acetaminophen] Other (See Comments) 10/23/2014    Family History  Problem Relation Age of Onset  . COPD Mother   . Prostate cancer Father 67       seed implant for treatment  . Colon polyps Father        'a few'  .  Breast cancer Sister 25  . Breast cancer Maternal Aunt        dx >50  . Breast cancer Other 35       bilateral  . Colon cancer Neg Hx     Social History   Socioeconomic History  . Marital status: Married    Spouse name: Not on file  . Number of children: 2  . Years of education: Not on file  . Highest education level: Not on file  Occupational History  . Not on file  Tobacco Use  . Smoking status: Current Every Day Smoker    Packs/day: 1.00    Types: Cigarettes  . Smokeless tobacco: Never Used  . Tobacco comment: e-cigs  Vaping Use  . Vaping Use: Some days  Substance and Sexual Activity  . Alcohol use: Yes    Alcohol/week: 21.0 standard drinks    Types: 21 Standard drinks or equivalent per week    Comment: 2 per weekend day; denies during the week (not consistent with prior hx)  . Drug use: No  . Sexual activity: Not on file  Other Topics Concern  . Not on file  Social History Narrative  . Not on file   Social Determinants of Health   Financial Resource Strain: Not on file  Food Insecurity: Not on file  Transportation Needs: Not on file  Physical Activity: Not on file  Stress: Not on file  Social Connections: Not on file  Intimate Partner Violence: Not on file    Review of Systems:    Constitutional: No weight loss, fever or chills Skin: No rash Cardiovascular: No chest pain Respiratory: No SOB Gastrointestinal: See HPI and otherwise negative Genitourinary: No dysuria  Neurological: No headache, dizziness or  syncope Musculoskeletal: No new muscle or joint pain Hematologic: No bleeding  Psychiatric: No history of depression or anxiety   Physical Exam:  Vital signs: BP 130/86   Pulse (!) 124   Ht 5\' 2"  (1.575 m)   Wt 133 lb 12.8 oz (60.7 kg)   BMI 24.47 kg/m   Constitutional:   Pleasant Caucasian female appears to be in NAD, Well developed, Well nourished, alert and cooperative Head:  Normocephalic and atraumatic. Eyes:   PEERL, EOMI. No icterus. Conjunctiva pink. Ears:  Normal auditory acuity. Neck:  Supple Throat: Oral cavity and pharynx without inflammation, swelling or lesion.  Respiratory: Respirations even and unlabored. Lungs clear to auscultation bilaterally.   No wheezes, crackles, or rhonchi.  Cardiovascular: Normal S1, S2. No MRG. Regular rate and rhythm. No peripheral edema, cyanosis or pallor.  Gastrointestinal:  Soft, nondistended, mild generalized TTP. No rebound or guarding. Normal bowel sounds. No appreciable masses or hepatomegaly. Rectal:  Not performed.  Msk:  Symmetrical without gross deformities. Without edema, no deformity or joint abnormality.  Neurologic:  Alert and  oriented x4;  grossly normal neurologically.  Skin:   Dry and intact without significant lesions or rashes. Psychiatric: Demonstrates good judgement and reason without abnormal affect or behaviors.  RELEVANT LABS AND IMAGING: CBC    Component Value Date/Time   WBC 10.9 (H) 07/06/2020 1330   RBC 4.23 07/06/2020 1330   HGB 15.1 (H) 07/06/2020 1330   HGB 12.9 10/25/2017 1230   HCT 42.8 07/06/2020 1330   PLT 386 07/06/2020 1330   PLT 290 10/25/2017 1230   MCV 101.2 (H) 07/06/2020 1330   MCH 35.7 (H) 07/06/2020 1330   MCHC 35.3 07/06/2020 1330   RDW 13.2 07/06/2020 1330   LYMPHSABS 1.8 07/02/2020  1532   MONOABS 0.8 07/02/2020 1532   EOSABS 0.0 07/02/2020 1532   BASOSABS 0.1 07/02/2020 1532    CMP     Component Value Date/Time   NA 133 (L) 07/06/2020 1330   K 3.3 (L) 07/06/2020 1330   CL  98 07/06/2020 1330   CO2 25 07/06/2020 1330   GLUCOSE 96 07/06/2020 1330   BUN 9 07/06/2020 1330   CREATININE 0.74 07/06/2020 1330   CREATININE 0.72 03/19/2019 0931   CALCIUM 8.5 (L) 07/06/2020 1330   PROT 6.4 (L) 07/06/2020 1330   ALBUMIN 3.4 (L) 07/06/2020 1330   AST 25 07/06/2020 1330   AST 17 10/25/2017 1230   ALT 15 07/06/2020 1330   ALT 10 10/25/2017 1230   ALKPHOS 62 07/06/2020 1330   BILITOT 0.3 07/06/2020 1330   BILITOT 0.4 10/25/2017 1230   GFRNONAA >60 07/06/2020 1330   GFRNONAA >60 10/25/2017 1230   GFRAA >60 12/13/2018 0402   GFRAA >60 10/25/2017 1230    Assessment: 1.  Diarrhea: For the past 4 months or so, getting some better it sounds like with minimal accompanying abdominal pain, GI pathogen panel and C. 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  History of seizures: Recently admitted to the hospital in April and started on Keppra, has not had follow-up with neurology 3.  Screening for colorectal cancer: Last colonoscopy 10 years ago  Plan: 1.  Went ahead and scheduled patient for a diagnostic colonoscopy in the [facility] with Dr. Silverio Decamp.  Did provide the patient a detailed list of risks for the procedure and she agrees to proceed. 2.  Patient can continue Imodium as needed 1 tab every 6 hours as needed.  If this is not working for her or diarrhea picks up she can call and let us know and would recommend Lomotil. 3.  After in-depth review of chart after patient was here it looks like she also never finished work-up for celiac disease and there was some question of this in the past.  Could consider celiac lab testing if colonoscopy unrevealing. 4.  Did go ahead and recommend the patient see neurology prior to scheduling colonoscopy so we can have clearance from them given that she had a recent seizure and has not followed with them. 5.  Patient to follow in clinic per  recommendations from Dr. Silverio Decamp after time of procedure.  Ellouise Newer, PA-C [facility] 07/29/2020, 10:39 AM  Cc: Cipriano Mile, NP

## 2020-07-29 NOTE — Patient Instructions (Signed)
Imodium 1 tablet every 6 hours as needed.  Need to follow up with neurology for clearance before we can schedule colonoscopy.   If you are age 75 or older, your body mass index should be between 23-30. Your Body mass index is 24.47 kg/m. If this is out of the aforementioned range listed, please consider follow up with your Primary Care Provider.  If you are age 31 or younger, your body mass index should be between 19-25. Your Body mass index is 24.47 kg/m. If this is out of the aformentioned range listed, please consider follow up with your Primary Care Provider.   __________________________________________________________  The [facility] providers would like to encourage you to use [facility] to communicate with providers for non-urgent requests or questions.  Due to long hold times on the telephone, sending your provider a message by [facility] may be a faster and more efficient way to get a response.  Please allow 48 business hours for a response.  Please remember that this is for non-urgent requests.

## 2020-07-30 ENCOUNTER — Telehealth: Payer: Self-pay | Admitting: *Deleted

## 2020-07-30 NOTE — Progress Notes (Signed)
Reviewed and agree with documentation and assessment and plan. K. Veena Sherif Millspaugh , MD   

## 2020-07-30 NOTE — Telephone Encounter (Signed)
RN received refill request for antiestrogen therapy from pt pharmacy. Pt last seen in our office in 2019.  RN placed call to pt to assess if pt is currently taking this medication and to schedule f/u with MD.  Pt states she has not taken them in over a year and does not wish to follow up with our office at this time.  RN educated pt on importance of breast cancer follow up visits.  Pt verbalized understanding and states she will call our office if any issues occur.

## 2020-08-02 NOTE — Progress Notes (Signed)
Assessment/Plan:   1.  Seizure  -Quite sometime ago, she had seizures with an abnormal EEG, demonstrating left temporal seizure.  The seizures were in the setting of hyponatremia.  Once again, she presented to the hospital recently with hyponatremia (although not terribly so).  Her history is also quite complicated by the fact that she is an alcoholic, and I suspect that alcohol withdrawal seizure could be playing a role here as well, if not the primary role.  Pt continues to drink.  Needs complete cessation under medical supervision.  -will check keppra level  -recheck chem  -will do ambulatory EEG   Subjective:   Mindy Young was seen today in follow up for seizure.  My previous records as well as any outside records available were reviewed prior to todays visit.  Pt with caregiver who supplements hx.  I have not seen the patient in about a year and a half.  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  Patient is a 75 year old female with a history of alcohol abuse, seizure, noncompliance with medication and follow-up.  Patient was admitted to the hospital on April 23 with seizure, not taking her Keppra.  Sodium level was 123.  Fluids were given and discharge level sodium was 133.  Routine EEG on June 14, 2020 was normal.  At that admission, she reported that she had had no alcohol for 6 months to 1 year.  However, records from care everywhere patient was in a detox center in February, 2022 in [place].  On discharge from her April admission, she was discharged on Keppra, 1000 mg twice per day.  She was back in the emergency room on May 12, but this was for nonneurologic reason (diarrhea for 3 weeks).  She had an appt with me on 07/10/20 but cx the day of the appt.  She states today that she came here today only because she needs a colonoscopy and was told she needed to come here before then.  Pt states that she has had no seizures since the hospital.  States  that she is taking her keppra, 1 po bid (1,000mg ).  She admits that she "sometimes" will have a shot at night because she has trouble sleeping.  She does that 4-5 times per week.    Prior notes re: seizure in 2021:  She was in the hospital in October with seizure.  Patient was at the emergency room the day prior to admission.  She was there for pain in the hip following a fall as well as constipation.  She was evaluated and sent home.  The home health care aide check on her around 11 am, and thought she was fine and around 1130, she had an episode where she could not bring out her words and she was staring into space.  She did not lose consciousness, but was not necessarily responding to questions.  This apparently happened several times.  She was transferred from the [facility] to [facility], [facility], [facility], and while in route she had an episode in the ambulance where she had left-sided gaze preference and was not responding with the eyes open.  She had weakness on the left side.  She was able to follow commands with the right side of the body.  She apparently also had a similar episode while in the emergency room.  Sodium was very low at 121.  She had an EEG.  She had multiple episodes during the EEG.  Episodes correlated with left temporal seizures.  She saw neurology/epilepsy at the hospital.  It was felt that these could have been provoked in the setting of hyponatremia.  However, because of the frequency of the seizures, AEDs were initiated.  She was started on Keppra, as well as pyridoxine.  It was noted that once the hyponatremia was corrected, the patient did not have any further seizures, it was possible that the patient could be weaned off her AED.  Patient was discharged on Keppra, 1000 mg twice per day.  She states that she is not taking as directed.  She is only taking q day for the last week or so. Compliance is better when caregivers around.  Caregivers have noted that mood/agitation better but she  has recently cut out lots of medication, not just the keppra.   She was told to follow-up here.  She actually had several visits which she canceled.  Pt states that she has had no alcohol since 09/2018.     CURRENT MEDICATIONS:  Outpatient Encounter Medications as of 08/03/2020  Medication Sig   ALPRAZolam (XANAX) 0.5 MG tablet Take 0.5 mg by mouth 2 (two) times daily as needed.   cetirizine (ZYRTEC) 10 MG tablet Take 10 mg by mouth as needed for allergies.   Cholecalciferol (VITAMIN D3) 50 MCG (2000 UT) capsule Take 2,000 Units by mouth every morning.   diphenhydrAMINE (BENADRYL) 25 MG tablet Take 25 mg by mouth at bedtime as needed for sleep.   levETIRAcetam (KEPPRA) 1000 MG tablet Take 1 tablet (1,000 mg total) by mouth 2 (two) times daily.   loperamide (IMODIUM) 2 MG capsule Take by mouth.   MAGNESIUM-OXIDE 400 (240 Mg) MG tablet Take 1 tablet by mouth daily.   mirtazapine (REMERON) 15 MG tablet Take 15 mg by mouth at bedtime.   Multiple Vitamin (MULTI VITAMIN DAILY) TABS Take 1 tablet by mouth every morning.   oxybutynin (DITROPAN-XL) 10 MG 24 hr tablet Take 10 mg by mouth daily.    pantoprazole (PROTONIX) 40 MG tablet Take 1 tablet by mouth daily.   potassium chloride SA (KLOR-CON) 20 MEQ tablet Take 1 tablet (20 mEq total) by mouth 2 (two) times daily for 7 days.   pyridoxine (B-6) 100 MG tablet Take 1 tablet (100 mg total) by mouth daily.   RA MELATONIN 10 MG TABS Take 10-20 mg by mouth at bedtime as needed for sleep.   TRINTELLIX 5 MG TABS tablet Take 5 mg by mouth daily.   ZOFRAN 4 MG tablet Take 4 mg by mouth as needed for nausea/vomiting.   TRELEGY ELLIPTA 100-62.5-25 MCG/INH AEPB Inhale 1 puff into the lungs every morning. (Patient not taking: No sig reported)   No facility-administered encounter medications on file as of 08/03/2020.     Objective:   PHYSICAL EXAMINATION:    VITALS:   Vitals:   08/03/20 1040  BP: 138/90  Pulse: (!) 110  SpO2: 95%  Weight: 137 lb (62.1  kg)  Height: 5\' 2"  (1.575 m)    GEN:  The patient appears stated age and is in NAD. HEENT:  Normocephalic, atraumatic.  The mucous membranes are moist. The superficial temporal arteries are without ropiness or tenderness. CV:  RRR Lungs:  CTAB Neck/HEME:  There are no carotid bruits bilaterally.  Neurological examination:  Orientation: The patient is alert and oriented to person/place.   Cranial nerves: There is good facial symmetry.The speech is fluent and clear. Soft palate rises symmetrically and there is no tongue deviation.  Hearing is intact to conversational tone. Sensation: Sensation is intact to light touch throughout Motor: Strength is at least antigravity x4.  Movement examination: Tone: There is normal tone in the UE/LE Abnormal movements:  no tremor.  No myoclonus.  No asterixis.   Coordination:  There is no decremation with RAM's. Gait and Station: The patient has no difficulty arising out of a deep-seated chair without the use of the hands. The patient's stride length is good but she is wide based and a bit unbalanced.    Total time spent on today's visit was 30 minutes, including both face-to-face time and nonface-to-face time.  Time included that spent on review of records (prior notes available to me/labs/imaging if pertinent), discussing treatment and goals, answering patient's questions and coordinating care.  Cc:  Cipriano Mile, NP

## 2020-08-03 ENCOUNTER — Encounter: Payer: Self-pay | Admitting: Neurology

## 2020-08-03 ENCOUNTER — Other Ambulatory Visit (INDEPENDENT_AMBULATORY_CARE_PROVIDER_SITE_OTHER): Payer: Medicare Other

## 2020-08-03 ENCOUNTER — Other Ambulatory Visit: Payer: Self-pay

## 2020-08-03 ENCOUNTER — Ambulatory Visit: Payer: Medicare Other | Admitting: Neurology

## 2020-08-03 VITALS — BP 138/90 | HR 110 | Ht 62.0 in | Wt 137.0 lb

## 2020-08-03 DIAGNOSIS — F1010 Alcohol abuse, uncomplicated: Secondary | ICD-10-CM | POA: Diagnosis not present

## 2020-08-03 DIAGNOSIS — R569 Unspecified convulsions: Secondary | ICD-10-CM

## 2020-08-03 DIAGNOSIS — E871 Hypo-osmolality and hyponatremia: Secondary | ICD-10-CM | POA: Diagnosis not present

## 2020-08-03 LAB — COMPREHENSIVE METABOLIC PANEL
ALT: 15 U/L (ref 0–35)
AST: 23 U/L (ref 0–37)
Albumin: 3.8 g/dL (ref 3.5–5.2)
Alkaline Phosphatase: 71 U/L (ref 39–117)
BUN: 12 mg/dL (ref 6–23)
CO2: 20 mEq/L (ref 19–32)
Calcium: 8.7 mg/dL (ref 8.4–10.5)
Chloride: 101 mEq/L (ref 96–112)
Creatinine, Ser: 0.6 mg/dL (ref 0.40–1.20)
GFR: 87.99 mL/min (ref >60.00)
Glucose, Bld: 99 mg/dL (ref 70–99)
Potassium: 3.8 mEq/L (ref 3.5–5.1)
Sodium: 134 mEq/L — ABNORMAL LOW (ref 135–145)
Total Bilirubin: 0.2 mg/dL (ref 0.2–1.2)
Total Protein: 6.6 g/dL (ref 6.0–8.3)

## 2020-08-03 NOTE — Patient Instructions (Addendum)
Your provider has requested that you have labwork completed today. The lab is located on the Second floor at [place], within the [facility] office. When you get off the elevator, turn right and go in the [facility] [address]; the first brown door on the left.  Tell the ladies behind the desk that you are there for lab work. If you are not called within 15 minutes please check with the front desk.   Once you complete your labs you are free to go. You will receive a call or message via MyChart with your lab results.    Your provider has requested that you have labwork completed today. Please go to [facility] ([address]) on the second floor of this building before leaving the office today. You do not need to check in. If you are not called within 15 minutes please check with the front desk.

## 2020-08-05 ENCOUNTER — Telehealth: Payer: Self-pay | Admitting: Neurology

## 2020-08-05 LAB — ETHANOL: Ethanol: 0.037 %

## 2020-08-05 NOTE — Telephone Encounter (Signed)
Pt called in to get her lab test results

## 2020-08-06 NOTE — Telephone Encounter (Signed)
I still don't have her Keppra level back.  That one can take a little bit.

## 2020-08-06 NOTE — Telephone Encounter (Signed)
Left message that the results were not back and we will call when results come in

## 2020-08-11 NOTE — Telephone Encounter (Signed)
I never did get her keppra level back. Please call lab and get the results

## 2020-08-11 NOTE — Telephone Encounter (Signed)
Mindy Young called and wants a call back from nurse. Did not state why

## 2020-08-12 NOTE — Telephone Encounter (Signed)
Called and spoke to patient and informed her that the labs were sent and received at [place] on 08/03/20 and that there is a 12 turnaround. Informed patient that we are on day 9 and hopefully we receive results soon.   Patient stated that her GI doctor will not run any test on her until Dr. Carles Collet says it is ok. Patients GI doctor wants to do a Endoscopy and Colonoscopy. Patient stated that she has been having "uncontrollable explosive diarrhea" since February.   Patient also wanted to ask Dr. Carles Collet if she could do her EEG in office? Patient doesn't think she can handle doing and wearing the EEG out of office due to the heat and really wants something sooner.   Informed patient that I would send Dr. Carles Collet a message and get back to her when I hear back. Also informed patient that as soon as we get her labs back we will give her a call. Patient verbalized understanding.

## 2020-08-12 NOTE — Telephone Encounter (Signed)
Called and spoke to [place] at the Lab and she stated that she called [facility] and was informed that they have received the lab on June 13 th and it can take up to 12 days to receive results. [facility] was unable to provide penny with a exact day.

## 2020-08-12 NOTE — Telephone Encounter (Signed)
Mindy Young called again wanting results. She said she is getting weaker every day. She has severe diarrhea and her GI doc wont help her till tat gets results back. [phone]

## 2020-08-13 LAB — LEVETIRACETAM, SERUM/PLASMA: LEVETIRACETAM, SERUM/PLASMA: 58 ug/mL

## 2020-08-13 NOTE — Telephone Encounter (Signed)
Patient called and requested an update on this.

## 2020-08-13 NOTE — Telephone Encounter (Signed)
Scheduled 48 hour ambulatory EEG July 6th.   Patient stated the GI doctor will not do any procedures until Dr. Carles Collet clears her for seizures. I told her Dr. Carles Collet wants this ambulatory EEG.  She asked for someone to call her about her test results. She has not heard anything yet and is anxious to find out the results.

## 2020-08-13 NOTE — Telephone Encounter (Signed)
I talked with her GI doc today and sent her a mychart message today about her keppra level that was fine.  I still recommend the EEG, as previously discussed.

## 2020-08-14 NOTE — Telephone Encounter (Signed)
She can also view those in her mychart account.  Her chem was normal.  She did have alcohol in the system the day she was seen.

## 2020-08-14 NOTE — Telephone Encounter (Signed)
Called patient and informed her of labs and also informed her that she did have alcohol in the system the day she was seen. Informed patient that she can view her results on MyChart.

## 2020-08-14 NOTE — Telephone Encounter (Signed)
Called patient and informed her that Dr. Carles Collet sent her a mychart message today about her keppra level and that was fine. Informed patient that Dr. Carles Collet still recommend the EEG, as previously discussed. Patient verbalized understanding and will check her Mychart message from Dr. Carles Collet.  Patient wanted to know what her other results were? Informed patient that I didn't see any results yet and would ask Dr tat and give her a call back.

## 2020-08-25 ENCOUNTER — Telehealth: Payer: Self-pay | Admitting: Neurology

## 2020-08-25 NOTE — Telephone Encounter (Signed)
Patient called an advised.EEG is scheduled for tomorrow.

## 2020-08-25 NOTE — Telephone Encounter (Signed)
Do they need to call PCP?

## 2020-08-25 NOTE — Telephone Encounter (Signed)
Pt called in wanting to see if Dr. Carles Collet will ok her to have a colonoscopy with her GI doctor about her extreme diarrhea? She says her GI doctor won't do anything about her extreme diarrhea with out Dr. Carles Collet saying it's ok because she has seizures.

## 2020-08-26 ENCOUNTER — Ambulatory Visit (INDEPENDENT_AMBULATORY_CARE_PROVIDER_SITE_OTHER): Payer: Medicare Other | Admitting: Neurology

## 2020-08-26 ENCOUNTER — Telehealth: Payer: Self-pay | Admitting: Physician Assistant

## 2020-08-26 ENCOUNTER — Other Ambulatory Visit: Payer: Self-pay

## 2020-08-26 DIAGNOSIS — R569 Unspecified convulsions: Secondary | ICD-10-CM

## 2020-08-26 MED ORDER — DIPHENOXYLATE-ATROPINE 2.5-0.025 MG PO TABS: 1.0000 | ORAL_TABLET | Freq: Four times a day (QID) | ORAL | 1 refills | Status: DC | PRN

## 2020-08-26 NOTE — Telephone Encounter (Signed)
Spoke with patient in regards to recommendations as outlined below. Patient is aware that we will call in the prescription for Lomotil, she is aware that she will take Lomotil instead of Imodium. Patient will try this in the interim. Patient verbalized understanding and had no concerns at the end of the call.   Spoke with Lonn Georgia at [facility] to call in prescription. Verbal read back was correct for prescription.

## 2020-08-26 NOTE — Telephone Encounter (Signed)
Unfortunately patient needs to have Neurology workup complete first as if she is having true seizures recently that needs to be controlled prior to receiving anesthesia. Her GI pathogen panel and C diff testing is negative. Recommend she switch from immodium to lomotil for now and see if that helps. Also noted she takes magnesium supplements which can make diarrhea worse. If she can stop the magnesium supplements recommend she do so and see if that helps in the interim as well. Thanks

## 2020-08-26 NOTE — Telephone Encounter (Signed)
Dr. Havery Moros as DOD PM of 08/26/20, please advise. Thanks.  Patient saw Anderson Malta on 07/29/20 for diarrhea, colon cancer screening, and a history of seizures. Her procedure was cancelled until she had Neuro eval   Spoke with patient today and states that she saw Dr. Carles Collet on 08/03/20 and today for an ambulatorry EEG. She states that she has to wear the monitor for 2-3 days and it can take anywhere from 2-3 weeks before they get the results back. She states that the nurse at Dr. Doristine Devoid office stated that she didn't know why they needed to see her prior to scheduling her procedure since she will be under sedation and on monitors during the procedure. Advised that it is for her safety that she is evaluated prior. She reports that continues to have 6-8 episodes of loose stools but describes as "explosive diarrhea". Patient states that she has not had any relief with Imodium every 6 hours. Advised that per last OV they may consider Lomotil for her. Patient is really concerned and would like to be rescheduled for her procedure as soon as possible. Patient states that she tries to leave her house but has accidents then has to return home to get cleaned up. She is aware that Dr. Silverio Decamp is out of the office this week and Anderson Malta will return tomorrow. Please advise, thanks.

## 2020-08-26 NOTE — Telephone Encounter (Signed)
Inbound call from patient. States she did not know why she needed to see the neurologist before scheduling her appointment. States it could be 3/4 weeks for results but she can not go that long feeling as she does. States she gets weaker every day where she could barely go to grocery store. Best contact number [phone]

## 2020-08-31 ENCOUNTER — Emergency Department (HOSPITAL_COMMUNITY)
Admission: EM | Admit: 2020-08-31 | Discharge: 2020-08-31 | Disposition: A | Discharge status: Home / Self Care | Payer: Medicare Other | Attending: Emergency Medicine | Admitting: Emergency Medicine

## 2020-08-31 ENCOUNTER — Encounter (HOSPITAL_COMMUNITY): Payer: Self-pay

## 2020-08-31 ENCOUNTER — Other Ambulatory Visit: Payer: Self-pay

## 2020-08-31 ENCOUNTER — Emergency Department (HOSPITAL_COMMUNITY): Payer: Medicare Other

## 2020-08-31 DIAGNOSIS — Y906 Blood alcohol level of 120-199 mg/100 ml: Secondary | ICD-10-CM | POA: Diagnosis not present

## 2020-08-31 DIAGNOSIS — Z853 Personal history of malignant neoplasm of breast: Secondary | ICD-10-CM | POA: Insufficient documentation

## 2020-08-31 DIAGNOSIS — Z79899 Other long term (current) drug therapy: Secondary | ICD-10-CM | POA: Diagnosis not present

## 2020-08-31 DIAGNOSIS — F10129 Alcohol abuse with intoxication, unspecified: Secondary | ICD-10-CM | POA: Diagnosis not present

## 2020-08-31 DIAGNOSIS — F17210 Nicotine dependence, cigarettes, uncomplicated: Secondary | ICD-10-CM | POA: Insufficient documentation

## 2020-08-31 DIAGNOSIS — I10 Essential (primary) hypertension: Secondary | ICD-10-CM | POA: Diagnosis not present

## 2020-08-31 DIAGNOSIS — S01112A Laceration without foreign body of left eyelid and periocular area, initial encounter: Secondary | ICD-10-CM | POA: Diagnosis not present

## 2020-08-31 DIAGNOSIS — W1839XA Other fall on same level, initial encounter: Secondary | ICD-10-CM | POA: Insufficient documentation

## 2020-08-31 DIAGNOSIS — Z85828 Personal history of other malignant neoplasm of skin: Secondary | ICD-10-CM | POA: Diagnosis not present

## 2020-08-31 DIAGNOSIS — S0990XA Unspecified injury of head, initial encounter: Secondary | ICD-10-CM | POA: Diagnosis present

## 2020-08-31 DIAGNOSIS — F10920 Alcohol use, unspecified with intoxication, uncomplicated: Secondary | ICD-10-CM

## 2020-08-31 LAB — CBC WITH DIFFERENTIAL/PLATELET
Abs Immature Granulocytes: 0.03 10*3/uL (ref 0.00–0.07)
Basophils Absolute: 0 10*3/uL (ref 0.0–0.1)
Basophils Relative: 1 %
Eosinophils Absolute: 0.1 10*3/uL (ref 0.0–0.5)
Eosinophils Relative: 2 %
HCT: 38.6 % (ref 36.0–46.0)
Hemoglobin: 13.4 g/dL (ref 12.0–15.0)
Immature Granulocytes: 0 %
Lymphocytes Relative: 36 %
Lymphs Abs: 2.6 10*3/uL (ref 0.7–4.0)
MCH: 35.5 pg — ABNORMAL HIGH (ref 26.0–34.0)
MCHC: 34.7 g/dL (ref 30.0–36.0)
MCV: 102.4 fL — ABNORMAL HIGH (ref 80.0–100.0)
Monocytes Absolute: 0.8 10*3/uL (ref 0.1–1.0)
Monocytes Relative: 11 %
Neutro Abs: 3.6 10*3/uL (ref 1.7–7.7)
Neutrophils Relative %: 50 %
Platelets: 274 10*3/uL (ref 150–400)
RBC: 3.77 MIL/uL — ABNORMAL LOW (ref 3.87–5.11)
RDW: 13.3 % (ref 11.5–15.5)
WBC: 7.1 10*3/uL (ref 4.0–10.5)
nRBC: 0 % (ref 0.0–0.2)

## 2020-08-31 LAB — COMPREHENSIVE METABOLIC PANEL
ALT: 18 U/L (ref 0–44)
AST: 35 U/L (ref 15–41)
Albumin: 3.5 g/dL (ref 3.5–5.0)
Alkaline Phosphatase: 64 U/L (ref 38–126)
Anion gap: 9 (ref 5–15)
BUN: 15 mg/dL (ref 8–23)
CO2: 24 mmol/L (ref 22–32)
Calcium: 8.8 mg/dL — ABNORMAL LOW (ref 8.9–10.3)
Chloride: 108 mmol/L (ref 98–111)
Creatinine, Ser: 0.62 mg/dL (ref 0.44–1.00)
GFR, Estimated: >60 mL/min (ref >60)
Glucose, Bld: 81 mg/dL (ref 70–99)
Potassium: 4 mmol/L (ref 3.5–5.1)
Sodium: 141 mmol/L (ref 135–145)
Total Bilirubin: 0.4 mg/dL (ref 0.3–1.2)
Total Protein: 6.4 g/dL — ABNORMAL LOW (ref 6.5–8.1)

## 2020-08-31 LAB — RAPID URINE DRUG SCREEN, HOSP PERFORMED
Amphetamines: NOT DETECTED
Barbiturates: NOT DETECTED
Benzodiazepines: POSITIVE — AB
Cocaine: NOT DETECTED
Opiates: NOT DETECTED
Tetrahydrocannabinol: NOT DETECTED

## 2020-08-31 LAB — ETHANOL: Alcohol, Ethyl (B): 160 mg/dL — ABNORMAL HIGH (ref <10)

## 2020-08-31 MED ORDER — CHLORDIAZEPOXIDE HCL 25 MG PO CAPS: ORAL_CAPSULE | ORAL | 0 refills | Status: DC

## 2020-08-31 NOTE — ED Notes (Signed)
Non-slips socks applied, patient is unable to stand without assistance, per EMS appears intoxicated. C-collar in place.

## 2020-08-31 NOTE — ED Notes (Signed)
Pt 89% RA, endorsing SOB, placed on 3L [place].

## 2020-08-31 NOTE — ED Provider Notes (Signed)
[facility] DEPT Provider Note   CSN: [id number] Arrival date & time: 08/31/20  1550     History Chief Complaint  Patient presents with   Mindy Young is a 75 y.o. female.  75 year old female with history of alcohol abuse presents after having a witnessed fall.  States that she drank several shots of bourbon today.  Also took her Xanax.  Complains of pain to the left side of her face.  Denies any neck pain.  No weakness in arms or legs.  Denies any hip or back pain.  Presents via EMS in a cervical collar      Past Medical History:  Diagnosis Date   Alcohol abuse    Allergy    Anemia 06/29/2011   Anxiety    Arthritis    Breast cancer ([place])    right breast   C1 cervical fracture ([place]) 02/1997   Cancer ([place])    melanoma   Depression    Family history of breast cancer    Family history of prostate cancer    GERD (gastroesophageal reflux disease)    Hypertension    Tardive dyskinesia     Patient Active Problem List   Diagnosis Date Noted   Hypokalemia 06/14/2020   Seizure ([place]) 06/13/2020   Impingement syndrome of left shoulder 03/27/2019   Pain in left shoulder 02/21/2019   Rib pain on right side 02/21/2019   Low back pain 02/07/2019   GERD (gastroesophageal reflux disease) 12/09/2018   Anxiety 12/09/2018   Difficulty speaking 12/09/2018   Leukocytosis 12/09/2018   Seizure-like activity ([place]) 12/09/2018   Pelvic fracture ([place]) 11/25/2018   Pubic bone fracture ([place]) 11/24/2018   Left knee pain 03/29/2018   Unilateral primary osteoarthritis, left knee 03/29/2018   Genetic testing 01/24/2018   Family history of breast cancer    Family history of prostate cancer    Malignant neoplasm of lower-inner quadrant of right breast of female, estrogen receptor positive ([place]) 10/24/2017   Anemia 06/29/2011   Hypertension    Hyponatremia 06/25/2011   Transaminitis 06/25/2011   Alcohol abuse 06/25/2011   Cough 06/25/2011   Cigarette  smoker 06/25/2011    Past Surgical History:  Procedure Laterality Date   ABDOMINAL HYSTERECTOMY     ASPIRATION OF ABSCESS Right 11/20/2017   Procedure: ASPIRATION OF RIGHT AXILLARY SEROMA;  Surgeon: Rolm Bookbinder, MD;  Location: [facility];  Service: General;  Laterality: Right;   AUGMENTATION MAMMAPLASTY Bilateral    biateral implants , approx 2015   BREAST LUMPECTOMY Right 11/02/2017   re-ex 11-20-17   BREAST LUMPECTOMY WITH RADIOACTIVE SEED AND SENTINEL LYMPH NODE BIOPSY Right 11/02/2017   Procedure: BREAST LUMPECTOMY WITH RADIOACTIVE SEED AND SENTINEL LYMPH NODE BIOPSY;  Surgeon: Rolm Bookbinder, MD;  Location: [facility];  Service: General;  Laterality: Right;   CHOLECYSTECTOMY     collarbone     INCONTINENCE SURGERY     KNEE SURGERY     removal of cyst, repair of cartiledge   LAPAROSCOPY     for endometriosis   RE-EXCISION OF BREAST LUMPECTOMY Right 11/20/2017   Procedure: RE-EXCISION OF RIGHT BREAST MARGINS;  Surgeon: Rolm Bookbinder, MD;  Location: [facility];  Service: General;  Laterality: Right;   ROTATOR CUFF REPAIR     left     OB History   No obstetric history on file.     Family History  Problem Relation Age of Onset   COPD  Mother    Prostate cancer Father 53       seed implant for treatment   Colon polyps Father        'a few'   Breast cancer Sister 36   Breast cancer Maternal Aunt        dx >50   Breast cancer Other 77       bilateral   Colon cancer Neg Hx     Social History   Tobacco Use   Smoking status: Every Day    Packs/day: 1.00    Pack years: 0.00    Types: Cigarettes   Smokeless tobacco: Never   Tobacco comments:    e-cigs  Vaping Use   Vaping Use: Some days  Substance Use Topics   Alcohol use: Yes    Alcohol/week: 21.0 standard drinks    Types: 21 Standard drinks or equivalent per week    Comment: 2 per weekend day; denies during the week (not consistent with prior hx)   Drug  use: No    Home Medications Prior to Admission medications   Medication Sig Start Date End Date Taking? Authorizing Provider  ALPRAZolam Duanne Moron) 0.5 MG tablet Take 0.5 mg by mouth 2 (two) times daily as needed. 07/01/20   [provider]  cetirizine (ZYRTEC) 10 MG tablet Take 10 mg by mouth as needed for allergies.    [provider]  Cholecalciferol (VITAMIN D3) 50 MCG (2000 UT) capsule Take 2,000 Units by mouth every morning. 04/03/20   [provider]  diphenhydrAMINE (BENADRYL) 25 MG tablet Take 25 mg by mouth at bedtime as needed for sleep.    [provider]  diphenoxylate-atropine (LOMOTIL) 2.5-0.025 MG tablet Take 1 tablet by mouth 4 (four) times daily as needed for diarrhea or loose stools. 08/26/20   Armbruster, Carlota Raspberry, MD  levETIRAcetam (KEPPRA) 1000 MG tablet Take 1 tablet (1,000 mg total) by mouth 2 (two) times daily. 06/15/20   Aline August, MD  loperamide (IMODIUM) 2 MG capsule Take by mouth. 06/23/20   [provider]  MAGNESIUM-OXIDE 400 (240 Mg) MG tablet Take 1 tablet by mouth daily. 07/17/20   [provider]  mirtazapine (REMERON) 15 MG tablet Take 15 mg by mouth at bedtime. 07/15/20   [provider]  Multiple Vitamin (MULTI VITAMIN DAILY) TABS Take 1 tablet by mouth every morning. 04/03/20   [provider]  oxybutynin (DITROPAN-XL) 10 MG 24 hr tablet Take 10 mg by mouth daily.  12/05/16   [provider]  pantoprazole (PROTONIX) 40 MG tablet Take 1 tablet by mouth daily. 07/01/20   [provider]  potassium chloride SA (KLOR-CON) 20 MEQ tablet Take 1 tablet (20 mEq total) by mouth 2 (two) times daily for 7 days. 07/06/20 08/03/20  Arnaldo Natal, MD  pyridoxine (B-6) 100 MG tablet Take 1 tablet (100 mg total) by mouth daily. 06/15/20   Aline August, MD  RA MELATONIN 10 MG TABS Take 10-20 mg by mouth at bedtime as needed for sleep. 03/29/20   [provider]  TRELEGY ELLIPTA  100-62.5-25 MCG/INH AEPB Inhale 1 puff into the lungs every morning. Patient not taking: No sig reported 04/03/20   [provider]  TRINTELLIX 5 MG TABS tablet Take 5 mg by mouth daily. 05/13/20   [provider]  ZOFRAN 4 MG tablet Take 4 mg by mouth as needed for nausea/vomiting. 04/03/20   [provider]    Allergies    Percocet [oxycodone-acetaminophen]  Review  of Systems   Review of Systems  All other systems reviewed and are negative.  Physical Exam Updated Vital Signs BP (!) 145/98 (BP Location: Right Arm)   Pulse 99   Temp 97.9 F (36.6 C) (Oral)   Resp 18   Ht 1.575 m (5\' 2" )   Wt 62 kg   SpO2 90%   BMI 25.00 kg/m   Physical Exam Vitals and nursing note reviewed.  Constitutional:      General: She is not in acute distress.    Appearance: Normal appearance. She is well-developed. She is not toxic-appearing.  HENT:     Head:   Eyes:     General: Lids are normal.     Conjunctiva/sclera: Conjunctivae normal.     Pupils: Pupils are equal, round, and reactive to light.  Neck:     Thyroid: No thyroid mass.     Trachea: No tracheal deviation.  Cardiovascular:     Rate and Rhythm: Normal rate and regular rhythm.     Heart sounds: Normal heart sounds. No murmur heard.   No gallop.  Pulmonary:     Effort: Pulmonary effort is normal. No respiratory distress.     Breath sounds: Normal breath sounds. No stridor. No decreased breath sounds, wheezing, rhonchi or rales.  Abdominal:     General: There is no distension.     Palpations: Abdomen is soft.     Tenderness: There is no abdominal tenderness. There is no rebound.  Musculoskeletal:        General: No tenderness. Normal range of motion.     Cervical back: Normal range of motion and neck supple.  Skin:    General: Skin is warm and dry.     Findings: No abrasion or rash.  Neurological:     Mental Status: She is alert and oriented to person, place, and time. Mental status is at baseline.      GCS: GCS eye subscore is 4. GCS verbal subscore is 5. GCS motor subscore is 6.     Cranial Nerves: Cranial nerves are intact. No cranial nerve deficit.     Sensory: No sensory deficit.     Motor: Motor function is intact.  Psychiatric:        Attention and Perception: Attention normal.        Speech: Speech normal.        Behavior: Behavior normal.    ED Results / Procedures / Treatments   Labs (all labs ordered are listed, but only abnormal results are displayed) Labs Reviewed - No data to display  EKG None  Radiology No results found.  Procedures Procedures   Medications Ordered in ED Medications - No data to display  ED Course  I have reviewed the triage vital signs and the nursing notes.  Pertinent labs & imaging results that were available during my care of the patient were reviewed by me and considered in my medical decision making (see chart for details).    MDM Rules/Calculators/A&P                         LACERATION REPAIR Performed by: Leota Jacobsen Authorized by: Leota Jacobsen Consent: Verbal consent obtained. Risks and benefits: risks, benefits and alternatives were discussed Consent given by: patient Patient identity confirmed: provided demographic data Prepped and Draped in normal sterile fashion Wound explored  Laceration Location: Left eyebrow  Laceration Length: 0.5 cm  No Foreign Bodies seen or palpated  Anesthesia: local infiltration    Skin closure: Dermabond    Technique:   Patient tolerance: Patient tolerated the procedure well with no immediate complications.  CT head and cervical spine without acute fracture.  Dermabond used to repair small 0.5 cm superficial laceration to left eyebrow.  Patient's alcohol level is 160 here.  She is been having issues with her gait and is followed by her doctor for this.  Labs are reassuring.  Patient has expressed a desire to not drink.  I had a long discussion with her about the dangers  of drinking and taking Librium.  She has a caregiver who also spoke with about this.  We will prescribe course of Librium as well as give outpatient referral Final Clinical Impression(s) / ED Diagnoses Final diagnoses:  None    Rx / DC Orders ED Discharge Orders     None        Lacretia Leigh, MD 08/31/20 2147

## 2020-08-31 NOTE — Discharge Instructions (Addendum)
Do not drink alcohol and take the Librium.  Follow-up with your primary care doctor

## 2020-08-31 NOTE — ED Triage Notes (Signed)
Patient found in kitchen on floor after walking dog, laceration noted to left eyebrow. Denies loc, pain, BTusage. +ETOH.  [id number]

## 2020-09-07 NOTE — Procedures (Signed)
ELECTROENCEPHALOGRAM REPORT  Dates of Recording: 08/26/2020 11:28AM to 08/28/2020 11:35AM  Patient's Name: Mindy Young MRN: [id number] Date of Birth: 09-05-1945  Referring Provider: Dr. Wells Guiles Tat  Procedure: 48-hour ambulatory video EEG  History: This is a 75 year old woman with 2 seizures in April 2022. EEG for classification.  Medications:  XANAX 0.5 MG tablet NORVASC 5 MG tablet ZYRTEC 10 MG tablet CATAPRES 0.1 MG tablet NORCO/VICODIN 5-325 MG tablet FEMARA 2.5 MG tablet KEPPRA 1000 MG tablet RITALIN 20 MG tablet TOPROL-XL 100 MG 24 hr tablet PRILOSEC 40 MG capsule MIRALAX / GLYCOLAX 17 g packet PHENERGAN 25 MG tablet B-6 100 MG tablet ULTRAM 50 MG tablet    Technical Summary: This is a 48-hour multichannel digital video EEG recording measured by the international 10-20 system with electrodes applied with paste and impedances below 5000 ohms performed as portable with EKG monitoring.  The digital EEG was referentially recorded, reformatted, and digitally filtered in a variety of bipolar and referential montages for optimal display.    DESCRIPTION OF RECORDING: During maximal wakefulness, the background activity consisted of a symmetric 10 Hz posterior dominant rhythm which was reactive to eye opening.  The record is symmetric.  There is an excess amount of diffuse low voltage beta activity seen throughout the recording. There were no epileptiform discharges seen in wakefulness.  During the recording, the patient progresses through wakefulness, drowsiness, and Stage 2 sleep.  During drowsiness and sleep, there is an increase in theta slowing, at times sharply contoured over the left temporal region without clear epileptogenic potential. Again, there were no clear epileptiform discharges seen.  Events: On 7/7 at 0530 hours, she reports a headache. Patient is not clearly seen on the video in the dark. Electrographically, there were no EEG or EKG changes seen.  On 7/7 at 0920  hours, she reports feeling anxious/depressed. Patient not on video. Electrographically, there were no EEG or EKG changes seen.  She reports symptoms on 7/8 at 12:00 (anxious/depressed), 7/8 12:30 (diarrhea/nausea), 7/8 8:30 (anxious), 7/8 4:00 (Xanax, anxious) but did not push button. No EEG changes during these time periods.   There were no electrographic seizures seen.  EKG lead was unremarkable.  IMPRESSION: This 48-hour ambulatory video EEG study is within normal limits except for excess amount of diffuse low voltage beta activity.  Clinical Correlation: Diffuse low voltage beta activity is commonly seen with sedating medications such as benzodiazepines.  In the absence of sedating medications, anxiety and hyperthyroidism may produce generalized beta activity.  The absence of epileptiform discharges does not exclude a clinical diagnosis of epilepsy.  If further clinical questions remain, inpatient video EEG monitoring may be helpful.  Clinical correlation is advised.    Ellouise Newer, MD

## 2020-09-12 ENCOUNTER — Encounter: Payer: Self-pay | Admitting: Gastroenterology

## 2020-09-14 NOTE — Telephone Encounter (Signed)
Patient called for EEG results.

## 2020-09-14 NOTE — Telephone Encounter (Signed)
It looked good.  No changes in meds

## 2020-09-15 NOTE — Telephone Encounter (Signed)
This has been addressed in multiple other phone calls here (please see others).  I talked with Dr. Silverio Decamp a while back about her.  I never stopped her colonoscopy and Dr. Silverio Decamp said she would take care of it from her standpoint.  Not sure why patient keeps calling here about colonoscopy (don't see calls to GI office).  I am going to copy dr. Silverio Decamp in this message as well

## 2020-09-15 NOTE — Telephone Encounter (Signed)
Ok, thank you

## 2020-09-15 NOTE — Telephone Encounter (Signed)
Thank you for clarifying, patient reported having more seizures when she saw Ellouise Newer (PA).  Please schedule colonoscopy next available appt for chronic diarrhea. Thanks

## 2020-09-15 NOTE — Telephone Encounter (Signed)
Patient is cleared for colonoscopy on all standpoints. Just needs to be scheduled.

## 2020-09-15 NOTE — Telephone Encounter (Signed)
Dr Silverio Decamp Patient can not do your first available colon spot which is Aug 23rd. She will be in [place]. I think Dr Candis Schatz has some sooner openings, she is agreeable for someone else to do the procedure sooner. Is this ok or should we ask Dr Candis Schatz first

## 2020-09-15 NOTE — Telephone Encounter (Signed)
Yes, its fine. Thanks

## 2020-09-15 NOTE — Telephone Encounter (Signed)
Heather, You seen this patient in the office it looks like you was going to get clearance for her and she was suppose to have been scheduled for a colonoscopy. Do you know what happened with this before I schedule her

## 2020-09-15 NOTE — Telephone Encounter (Signed)
FYI Dr Silverio Decamp- I called patient to get her on Dr Jacky Kindle or Dr Evette Doffing schedule Dr Lyndel Safe had the earliest opening  She has now decided to wait until she gets back from [place] and her daughter has her baby,  she has decided to call back to schedule.

## 2020-09-15 NOTE — Telephone Encounter (Signed)
Called and informed patient of her EEG results and recommendations of no change in meds. Patient verbalized understanding and wanted to know if Dr. Carles Collet could let her GI know if it is ok to proceed with her testing with GI. Patient stated that her GI is Dr. Shirlyn Goltz (?) patient was not sure how to spell her GI's name.   Patient would like a call back

## 2020-09-15 NOTE — Telephone Encounter (Signed)
Pt called and spoke with AN. Still having the same issues, stated she still has not heard back regarding her EEG. Tried calling her but VM is full.

## 2020-10-06 ENCOUNTER — Other Ambulatory Visit: Payer: Self-pay

## 2020-10-06 ENCOUNTER — Emergency Department (HOSPITAL_COMMUNITY): Payer: Medicare Other

## 2020-10-06 ENCOUNTER — Emergency Department (HOSPITAL_COMMUNITY)
Admission: EM | Admit: 2020-10-06 | Discharge: 2020-10-07 | Disposition: A | Discharge status: Home / Self Care | Payer: Medicare Other | Attending: Emergency Medicine | Admitting: Emergency Medicine

## 2020-10-06 ENCOUNTER — Encounter (HOSPITAL_COMMUNITY): Payer: Self-pay

## 2020-10-06 DIAGNOSIS — W19XXXA Unspecified fall, initial encounter: Secondary | ICD-10-CM | POA: Insufficient documentation

## 2020-10-06 DIAGNOSIS — Z5321 Procedure and treatment not carried out due to patient leaving prior to being seen by health care provider: Secondary | ICD-10-CM | POA: Insufficient documentation

## 2020-10-06 DIAGNOSIS — F1010 Alcohol abuse, uncomplicated: Secondary | ICD-10-CM | POA: Insufficient documentation

## 2020-10-06 DIAGNOSIS — R0781 Pleurodynia: Secondary | ICD-10-CM | POA: Insufficient documentation

## 2020-10-06 NOTE — ED Triage Notes (Signed)
Pt arrives EMS from home after a fall and drinking alcohol. C/o right rib pain.

## 2020-10-07 NOTE — ED Notes (Addendum)
Front desk advised that the pt left the ED. Triage RN notified. Pt no longer present in ED lobby.

## 2020-10-13 ENCOUNTER — Telehealth: Payer: Self-pay | Admitting: Physician Assistant

## 2020-10-13 NOTE — Telephone Encounter (Signed)
Inbound call from caregiver [name]. States patient is still having bad diarrhea with some days better than other. Patient have not taken Imodium in a week or 2 because believes she was taking too much medication at one time.    Patient did see the neurologist in July. Wants to know the next steps.  Best contact number [phone]

## 2020-10-13 NOTE — Telephone Encounter (Signed)
Spoke with [name], she states that patient has been having 1-2 episodes of severe explosive diarrhea. He has not given her any imodium. She states that the Imodium did help some when she was previously taking it. Trenton Gammon to have patient resume Imodium. Advised that since she is having severe diarrhea she can take 2 tablets at once today and begin with 1 tablet tomorrow every 6 hours as needed. Advised them to give [place] a call back is diarrhea persists despite the Imodium. Patient has been scheduled for a follow up with Ellouise Newer, PA-C on Thursday, 11/12/20 at 10:30 am. [name] verbalized understanding of all information and had no concerns at the end of the call.

## 2020-10-26 ENCOUNTER — Emergency Department (HOSPITAL_BASED_OUTPATIENT_CLINIC_OR_DEPARTMENT_OTHER)
Admission: EM | Admit: 2020-10-26 | Discharge: 2020-10-26 | Disposition: A | Discharge status: Home / Self Care | Payer: Medicare Other | Attending: Emergency Medicine | Admitting: Emergency Medicine

## 2020-10-26 ENCOUNTER — Emergency Department (HOSPITAL_BASED_OUTPATIENT_CLINIC_OR_DEPARTMENT_OTHER): Payer: Medicare Other

## 2020-10-26 ENCOUNTER — Encounter (HOSPITAL_BASED_OUTPATIENT_CLINIC_OR_DEPARTMENT_OTHER): Payer: Self-pay | Admitting: *Deleted

## 2020-10-26 ENCOUNTER — Other Ambulatory Visit: Payer: Self-pay

## 2020-10-26 DIAGNOSIS — W01198A Fall on same level from slipping, tripping and stumbling with subsequent striking against other object, initial encounter: Secondary | ICD-10-CM | POA: Insufficient documentation

## 2020-10-26 DIAGNOSIS — S3210XS Unspecified fracture of sacrum, sequela: Secondary | ICD-10-CM | POA: Insufficient documentation

## 2020-10-26 DIAGNOSIS — S4992XA Unspecified injury of left shoulder and upper arm, initial encounter: Secondary | ICD-10-CM | POA: Diagnosis present

## 2020-10-26 DIAGNOSIS — M79672 Pain in left foot: Secondary | ICD-10-CM | POA: Insufficient documentation

## 2020-10-26 DIAGNOSIS — Z853 Personal history of malignant neoplasm of breast: Secondary | ICD-10-CM | POA: Insufficient documentation

## 2020-10-26 DIAGNOSIS — J690 Pneumonitis due to inhalation of food and vomit: Secondary | ICD-10-CM | POA: Insufficient documentation

## 2020-10-26 DIAGNOSIS — I10 Essential (primary) hypertension: Secondary | ICD-10-CM | POA: Diagnosis not present

## 2020-10-26 DIAGNOSIS — R42 Dizziness and giddiness: Secondary | ICD-10-CM | POA: Diagnosis not present

## 2020-10-26 DIAGNOSIS — F17210 Nicotine dependence, cigarettes, uncomplicated: Secondary | ICD-10-CM | POA: Insufficient documentation

## 2020-10-26 DIAGNOSIS — T1490XA Injury, unspecified, initial encounter: Secondary | ICD-10-CM

## 2020-10-26 DIAGNOSIS — W19XXXA Unspecified fall, initial encounter: Secondary | ICD-10-CM

## 2020-10-26 DIAGNOSIS — Y906 Blood alcohol level of 120-199 mg/100 ml: Secondary | ICD-10-CM | POA: Diagnosis not present

## 2020-10-26 DIAGNOSIS — Z79899 Other long term (current) drug therapy: Secondary | ICD-10-CM | POA: Diagnosis not present

## 2020-10-26 DIAGNOSIS — E871 Hypo-osmolality and hyponatremia: Secondary | ICD-10-CM | POA: Diagnosis not present

## 2020-10-26 DIAGNOSIS — S42002A Fracture of unspecified part of left clavicle, initial encounter for closed fracture: Secondary | ICD-10-CM

## 2020-10-26 LAB — URINALYSIS, MICROSCOPIC (REFLEX): Squamous Epithelial / HPF: >50 (ref 0–5)

## 2020-10-26 LAB — CBC WITH DIFFERENTIAL/PLATELET
Abs Immature Granulocytes: 0.05 10*3/uL (ref 0.00–0.07)
Basophils Absolute: 0 10*3/uL (ref 0.0–0.1)
Basophils Relative: 1 %
Eosinophils Absolute: 0 10*3/uL (ref 0.0–0.5)
Eosinophils Relative: 0 %
HCT: 33.6 % — ABNORMAL LOW (ref 36.0–46.0)
Hemoglobin: 12.1 g/dL (ref 12.0–15.0)
Immature Granulocytes: 1 %
Lymphocytes Relative: 24 %
Lymphs Abs: 2 10*3/uL (ref 0.7–4.0)
MCH: 36.3 pg — ABNORMAL HIGH (ref 26.0–34.0)
MCHC: 36 g/dL (ref 30.0–36.0)
MCV: 100.9 fL — ABNORMAL HIGH (ref 80.0–100.0)
Monocytes Absolute: 1 10*3/uL (ref 0.1–1.0)
Monocytes Relative: 12 %
Neutro Abs: 5.2 10*3/uL (ref 1.7–7.7)
Neutrophils Relative %: 62 %
Platelets: 289 10*3/uL (ref 150–400)
RBC: 3.33 MIL/uL — ABNORMAL LOW (ref 3.87–5.11)
RDW: 13 % (ref 11.5–15.5)
WBC: 8.3 10*3/uL (ref 4.0–10.5)
nRBC: 0 % (ref 0.0–0.2)

## 2020-10-26 LAB — COMPREHENSIVE METABOLIC PANEL
ALT: 18 U/L (ref 0–44)
AST: 44 U/L — ABNORMAL HIGH (ref 15–41)
Albumin: 3.6 g/dL (ref 3.5–5.0)
Alkaline Phosphatase: 75 U/L (ref 38–126)
Anion gap: 14 (ref 5–15)
BUN: 8 mg/dL (ref 8–23)
CO2: 24 mmol/L (ref 22–32)
Calcium: 8.5 mg/dL — ABNORMAL LOW (ref 8.9–10.3)
Chloride: 94 mmol/L — ABNORMAL LOW (ref 98–111)
Creatinine, Ser: 0.61 mg/dL (ref 0.44–1.00)
GFR, Estimated: >60 mL/min (ref >60)
Glucose, Bld: 122 mg/dL — ABNORMAL HIGH (ref 70–99)
Potassium: 4.1 mmol/L (ref 3.5–5.1)
Sodium: 132 mmol/L — ABNORMAL LOW (ref 135–145)
Total Bilirubin: 1.1 mg/dL (ref 0.3–1.2)
Total Protein: 6.8 g/dL (ref 6.5–8.1)

## 2020-10-26 LAB — ETHANOL: Alcohol, Ethyl (B): 131 mg/dL — ABNORMAL HIGH (ref <10)

## 2020-10-26 LAB — RAPID URINE DRUG SCREEN, HOSP PERFORMED
Amphetamines: NOT DETECTED
Barbiturates: NOT DETECTED
Benzodiazepines: POSITIVE — AB
Cocaine: NOT DETECTED
Opiates: NOT DETECTED
Tetrahydrocannabinol: NOT DETECTED

## 2020-10-26 LAB — URINALYSIS, ROUTINE W REFLEX MICROSCOPIC
Bilirubin Urine: NEGATIVE
Glucose, UA: NEGATIVE mg/dL
Hgb urine dipstick: NEGATIVE
Ketones, ur: NEGATIVE mg/dL
Nitrite: NEGATIVE
Protein, ur: NEGATIVE mg/dL
Specific Gravity, Urine: 1.01 (ref 1.005–1.030)
pH: 6.5 (ref 5.0–8.0)

## 2020-10-26 LAB — AMMONIA: Ammonia: 39 umol/L — ABNORMAL HIGH (ref 9–35)

## 2020-10-26 MED ORDER — IOHEXOL 350 MG/ML SOLN
85.0000 mL | Freq: Once | INTRAVENOUS | Status: AC | PRN
  Administered 2020-10-26: 85 mL via INTRAVENOUS

## 2020-10-26 MED ORDER — CHLORDIAZEPOXIDE HCL 25 MG PO CAPS
100.0000 mg | ORAL_CAPSULE | Freq: Once | ORAL | Status: AC
  Administered 2020-10-26: 100 mg via ORAL
  Filled 2020-10-26: qty 4

## 2020-10-26 MED ORDER — MORPHINE SULFATE (PF) 4 MG/ML IV SOLN
4.0000 mg | Freq: Once | INTRAVENOUS | Status: AC
Start: 2020-10-26 — End: 2020-10-26
  Administered 2020-10-26: 4 mg via INTRAVENOUS
  Filled 2020-10-26: qty 1

## 2020-10-26 MED ORDER — AMOXICILLIN-POT CLAVULANATE 875-125 MG PO TABS: 1.0000 | ORAL_TABLET | Freq: Two times a day (BID) | ORAL | 0 refills | Status: DC

## 2020-10-26 MED ORDER — SODIUM CHLORIDE 0.9 % IV BOLUS
1000.0000 mL | Freq: Once | INTRAVENOUS | Status: AC
  Administered 2020-10-26: 1000 mL via INTRAVENOUS

## 2020-10-26 MED ORDER — LORAZEPAM 1 MG PO TABS
1.0000 mg | ORAL_TABLET | Freq: Once | ORAL | Status: AC
  Administered 2020-10-26: 1 mg via ORAL
  Filled 2020-10-26: qty 1

## 2020-10-26 NOTE — ED Triage Notes (Addendum)
4 days ago she lost her balance and fell x 2 while getting up to go to the restroom. Pain and bruising to her chest, breast and back. She is not taking blood thinners. She admits to drinking alcohol this am to "kill the pain".  Pt is a high fall risk. Care giver with her.

## 2020-10-26 NOTE — ED Provider Notes (Signed)
[facility] Provider Note   CSN: [id number] Arrival date & time: 10/26/20  1331     History Chief Complaint  Patient presents with   Mindy Young    Mindy Young is a 75 y.o. female with a past medical history significant for alcohol abuse, breast cancer, GERD, hypertension, tardive dyskinesia, history of seizures who presents to the ED due to recurrent falls.  Patient states she had 2 falls 4 days ago.  Caregiver at bedside notes that falls occurred in the middle of the night.  Patient states she was getting up to take a shot of alcohol to help her sleep in which she fell twice.  She admits to hitting her head.  No loss of consciousness.  She is not currently on blood thinners.  Patient admits to chest wall pain associated with ecchymosis.  No shortness of breath.  She also endorses left shoulder and arm pain.  Pain worse with movement and palpation.  She also admits to left foot pain.  Patient states she roughly drinks 4 shots daily.  She also notes she has been having chronic diarrhea since February which she believes causes her to feel weak and fall. She notes she has seen GI for her symptoms and was given medications which have improved her diarrhea. Caregiver at bedside notes that patient falls a lot at baseline.  Patient denies speech changes, unilateral weakness, visual changes.  No treatment prior to arrival.  No aggravating or alleviating factors.  History obtained from patient and past medical records. No interpreter used during encounter.       Past Medical History:  Diagnosis Date   Alcohol abuse    Allergy    Anemia 06/29/2011   Anxiety    Arthritis    Breast cancer ([place])    right breast   C1 cervical fracture ([place]) 02/1997   Cancer ([place])    melanoma   Depression    Family history of breast cancer    Family history of prostate cancer    GERD (gastroesophageal reflux disease)    Hypertension    Tardive dyskinesia     Patient Active Problem  List   Diagnosis Date Noted   Hypokalemia 06/14/2020   Seizure ([place]) 06/13/2020   Impingement syndrome of left shoulder 03/27/2019   Pain in left shoulder 02/21/2019   Rib pain on right side 02/21/2019   Low back pain 02/07/2019   GERD (gastroesophageal reflux disease) 12/09/2018   Anxiety 12/09/2018   Difficulty speaking 12/09/2018   Leukocytosis 12/09/2018   Seizure-like activity ([place]) 12/09/2018   Pelvic fracture ([place]) 11/25/2018   Pubic bone fracture ([place]) 11/24/2018   Left knee pain 03/29/2018   Unilateral primary osteoarthritis, left knee 03/29/2018   Genetic testing 01/24/2018   Family history of breast cancer    Family history of prostate cancer    Malignant neoplasm of lower-inner quadrant of right breast of female, estrogen receptor positive ([place]) 10/24/2017   Anemia 06/29/2011   Hypertension    Hyponatremia 06/25/2011   Transaminitis 06/25/2011   Alcohol abuse 06/25/2011   Cough 06/25/2011   Cigarette smoker 06/25/2011    Past Surgical History:  Procedure Laterality Date   ABDOMINAL HYSTERECTOMY     ASPIRATION OF ABSCESS Right 11/20/2017   Procedure: ASPIRATION OF RIGHT AXILLARY SEROMA;  Surgeon: Rolm Bookbinder, MD;  Location: [facility];  Service: General;  Laterality: Right;   AUGMENTATION MAMMAPLASTY Bilateral    biateral implants , approx 2015   BREAST  LUMPECTOMY Right 11/02/2017   re-ex 11-20-17   BREAST LUMPECTOMY WITH RADIOACTIVE SEED AND SENTINEL LYMPH NODE BIOPSY Right 11/02/2017   Procedure: BREAST LUMPECTOMY WITH RADIOACTIVE SEED AND SENTINEL LYMPH NODE BIOPSY;  Surgeon: Rolm Bookbinder, MD;  Location: [facility];  Service: General;  Laterality: Right;   CHOLECYSTECTOMY     collarbone     INCONTINENCE SURGERY     KNEE SURGERY     removal of cyst, repair of cartiledge   LAPAROSCOPY     for endometriosis   RE-EXCISION OF BREAST LUMPECTOMY Right 11/20/2017   Procedure: RE-EXCISION OF RIGHT BREAST MARGINS;  Surgeon:  Rolm Bookbinder, MD;  Location: [facility];  Service: General;  Laterality: Right;   ROTATOR CUFF REPAIR     left     OB History   No obstetric history on file.     Family History  Problem Relation Age of Onset   COPD Mother    Prostate cancer Father 5       seed implant for treatment   Colon polyps Father        'a few'   Breast cancer Sister 40   Breast cancer Maternal Aunt        dx >50   Breast cancer Other 72       bilateral   Colon cancer Neg Hx     Social History   Tobacco Use   Smoking status: Every Day    Packs/day: 1.00    Types: Cigarettes   Smokeless tobacco: Never   Tobacco comments:    e-cigs  Vaping Use   Vaping Use: Former  Substance Use Topics   Alcohol use: Yes    Alcohol/week: 21.0 standard drinks    Types: 21 Standard drinks or equivalent per week    Comment: 2 per weekend day; denies during the week (not consistent with prior hx)   Drug use: No    Home Medications Prior to Admission medications   Medication Sig Start Date End Date Taking? Authorizing Provider  amoxicillin-clavulanate (AUGMENTIN) 875-125 MG tablet Take 1 tablet by mouth every 12 (twelve) hours. 10/26/20  Yes Whittany Parish, Druscilla Brownie, PA-C  cetirizine (ZYRTEC) 10 MG tablet Take 10 mg by mouth as needed for allergies.    [provider]  chlordiazePOXIDE (LIBRIUM) 25 MG capsule '50mg'$  PO TID x 1D, then 25-'50mg'$  PO BID X 1D, then 25-'50mg'$  PO QD X 1D 08/31/20   Lacretia Leigh, MD  Cholecalciferol (VITAMIN D3) 50 MCG (2000 UT) capsule Take 2,000 Units by mouth every morning. 04/03/20   [provider]  diphenhydrAMINE (BENADRYL) 25 MG tablet Take 25 mg by mouth at bedtime as needed for sleep.    [provider]  diphenoxylate-atropine (LOMOTIL) 2.5-0.025 MG tablet Take 1 tablet by mouth 4 (four) times daily as needed for diarrhea or loose stools. 08/26/20   Armbruster, Carlota Raspberry, MD  levETIRAcetam (KEPPRA) 1000 MG tablet Take 1 tablet (1,000 mg total)  by mouth 2 (two) times daily. 06/15/20   Aline August, MD  loperamide (IMODIUM) 2 MG capsule Take by mouth. 06/23/20   [provider]  MAGNESIUM-OXIDE 400 (240 Mg) MG tablet Take 1 tablet by mouth daily. 07/17/20   [provider]  mirtazapine (REMERON) 15 MG tablet Take 15 mg by mouth at bedtime. 07/15/20   [provider]  Multiple Vitamin (MULTI VITAMIN DAILY) TABS Take 1 tablet by mouth every morning. 04/03/20   [provider]  oxybutynin (DITROPAN-XL) 10 MG 24  hr tablet Take 10 mg by mouth daily.  12/05/16   [provider]  pantoprazole (PROTONIX) 40 MG tablet Take 1 tablet by mouth daily. 07/01/20   [provider]  potassium chloride SA (KLOR-CON) 20 MEQ tablet Take 1 tablet (20 mEq total) by mouth 2 (two) times daily for 7 days. 07/06/20 08/03/20  Arnaldo Natal, MD  pyridoxine (B-6) 100 MG tablet Take 1 tablet (100 mg total) by mouth daily. 06/15/20   Aline August, MD  RA MELATONIN 10 MG TABS Take 10-20 mg by mouth at bedtime as needed for sleep. 03/29/20   [provider]  TRELEGY ELLIPTA 100-62.5-25 MCG/INH AEPB Inhale 1 puff into the lungs every morning. Patient not taking: No sig reported 04/03/20   [provider]  TRINTELLIX 5 MG TABS tablet Take 5 mg by mouth daily. 05/13/20   [provider]  ZOFRAN 4 MG tablet Take 4 mg by mouth as needed for nausea/vomiting. 04/03/20   [provider]    Allergies    Percocet [oxycodone-acetaminophen]  Review of Systems   Review of Systems  Constitutional:  Negative for chills and fever.  Respiratory:  Negative for shortness of breath.   Cardiovascular:  Positive for chest pain (chest wall pain).  Gastrointestinal:  Positive for diarrhea. Negative for abdominal pain, nausea and vomiting.  Musculoskeletal:  Positive for back pain (sacrum pain).  Skin:  Positive for color change.  Neurological:  Positive for light-headedness.  All other systems reviewed and  are negative.  Physical Exam Updated Vital Signs BP (!) 128/99 (BP Location: Right Arm)   Pulse (!) 104   Temp 98.1 F (36.7 C) (Oral)   Resp 20   Ht '5\' 2"'$  (1.575 m)   Wt 59 kg   SpO2 93%   BMI 23.79 kg/m   Physical Exam Vitals and nursing note reviewed.  Constitutional:      General: She is not in acute distress.    Appearance: She is not ill-appearing.  HENT:     Head: Normocephalic.  Eyes:     Pupils: Pupils are equal, round, and reactive to light.  Neck:     Comments: No cervical midline tenderness Cardiovascular:     Rate and Rhythm: Normal rate and regular rhythm.     Pulses: Normal pulses.     Heart sounds: Normal heart sounds. No murmur heard.   No friction rub. No gallop.  Pulmonary:     Effort: Pulmonary effort is normal.     Breath sounds: Normal breath sounds.  Chest:     Comments: Tenderness palpation throughout entire anterior chest wall.  Entire anterior chest wall with ecchymosis.  No deformity or crepitus. Abdominal:     General: Abdomen is flat. There is no distension.     Palpations: Abdomen is soft.     Tenderness: There is no abdominal tenderness. There is no guarding or rebound.  Musculoskeletal:        General: Normal range of motion.     Cervical back: Neck supple.     Comments: No thoracic or lumbar midline tenderness.  Tenderness throughout sacrum.  Tenderness palpation over posterior aspect of left shoulder with mild ecchymosis.  Full range of motion.  Radial pulse intact.  Tenderness throughout left forearm.  No deformity.  Soft compartments.  Skin:    General: Skin is warm and dry.  Neurological:     General: No focal deficit present.     Mental Status: She is alert.  Psychiatric:  Mood and Affect: Mood normal.        Behavior: Behavior normal.    ED Results / Procedures / Treatments   Labs (all labs ordered are listed, but only abnormal results are displayed) Labs Reviewed  CBC WITH DIFFERENTIAL/PLATELET - Abnormal; Notable  for the following components:      Result Value   RBC 3.33 (*)    HCT 33.6 (*)    MCV 100.9 (*)    MCH 36.3 (*)    All other components within normal limits  COMPREHENSIVE METABOLIC PANEL - Abnormal; Notable for the following components:   Sodium 132 (*)    Chloride 94 (*)    Glucose, Bld 122 (*)    Calcium 8.5 (*)    AST 44 (*)    All other components within normal limits  ETHANOL - Abnormal; Notable for the following components:   Alcohol, Ethyl (B) 131 (*)    All other components within normal limits  URINALYSIS, ROUTINE W REFLEX MICROSCOPIC - Abnormal; Notable for the following components:   APPearance HAZY (*)    Leukocytes,Ua SMALL (*)    All other components within normal limits  RAPID URINE DRUG SCREEN, HOSP PERFORMED - Abnormal; Notable for the following components:   Benzodiazepines POSITIVE (*)    All other components within normal limits  URINALYSIS, MICROSCOPIC (REFLEX) - Abnormal; Notable for the following components:   Bacteria, UA FEW (*)    All other components within normal limits  AMMONIA - Abnormal; Notable for the following components:   Ammonia 39 (*)    All other components within normal limits  URINE CULTURE    EKG EKG Interpretation  Date/Time:  Monday October 26 2020 15:14:03 EDT Ventricular Rate:  97 PR Interval:  134 QRS Duration: 94 QT Interval:  358 QTC Calculation: 455 R Axis:   107 Text Interpretation: Sinus rhythm Right axis deviation No significant change since last tracing Confirmed by Deno Etienne [phone]) on 10/26/2020 3:22:58 PM  Radiology DG Chest 2 View  Addendum Date: 10/26/2020   ADDENDUM REPORT: 10/26/2020 16:24 ADDENDUM: On comparison with the same day chest CT, the apparent lucency in the right lung base reflects right middle lobe atelectasis. There is no pneumothorax. Electronically Signed   By: Zerita Boers M.D.   On: 10/26/2020 16:24   Result Date: 10/26/2020 CLINICAL DATA:  Chest pain.  History of a fall 4 days ago. EXAM:  CHEST - 2 VIEW COMPARISON:  Chest radiograph dated 10/06/2020. FINDINGS: The heart size is normal. Vascular calcifications are seen in the aortic arch. Both lungs are clear. Lucency at the right lung base likely represents an overlying skin fold as opposed to a moderately large pneumothorax. Degenerative changes are seen in the spine. IMPRESSION: Lucency at the right lung base likely represents an overlying skin fold as opposed to a moderately large pneumothorax. Aortic Atherosclerosis (ICD10-I70.0). Electronically Signed: By: Zerita Boers M.D. On: 10/26/2020 16:19   DG Sacrum/Coccyx  Result Date: 10/26/2020 CLINICAL DATA:  Sacral and coccyx pain after a fall 4 days ago. EXAM: SACRUM AND COCCYX - 2+ VIEW COMPARISON:  Pelvic radiographs dated 02/07/2019. FINDINGS: There is an apparent cortical discontinuity of the sacrum at the level of S5 which is favored to reflect an acute fracture. Degenerative changes are seen in the lumbar spine. Chronic deformity of the right inferior and right superior pubic rami reflect prior fractures. Mild degenerative changes are seen in both hips. Stool overlies the rectum. IMPRESSION: Likely acute fracture of the sacrum  at the level of S5. Electronically Signed   By: Zerita Boers M.D.   On: 10/26/2020 16:23   DG Forearm Left  Result Date: 10/26/2020 CLINICAL DATA:  Fall with forearm pain. EXAM: LEFT FOREARM - 2 VIEW COMPARISON:  Left elbow radiographs dated 12/10/2010. FINDINGS: There is moderate narrowing and sclerosis of the radioscaphoid joint and a questionable step-off of the distal radius which may represent an acute fracture or chronic deformity. The scapholunate distance appears normal. Soft tissues are unremarkable. IMPRESSION: Questionable step-off of the distal radius with superimposed moderate degenerative changes may represent an acute fracture. Recommend wrist radiographs for further evaluation. Electronically Signed   By: Zerita Boers M.D.   On: 10/26/2020 16:14    DG Wrist Complete Left  Result Date: 10/26/2020 CLINICAL DATA:  Fall, injury EXAM: LEFT WRIST - COMPLETE 3+ VIEW COMPARISON:  10/26/2020 FINDINGS: Possible intra-articular subtle fracture deformity at the radial styloid. No malalignment. Advanced degenerative change at the distal radiocarpal joint. IMPRESSION: Possible subtle intra-articular fracture involving radial styloid, correlate for point tenderness. Limited by advanced degenerative changes in the region. Electronically Signed   By: Donavan Foil M.D.   On: 10/26/2020 17:08   CT HEAD WO CONTRAST (5MM)  Result Date: 10/26/2020 CLINICAL DATA:  Fall 4 days ago.  Bruising to chest and back. EXAM: CT HEAD WITHOUT CONTRAST CT CERVICAL SPINE WITHOUT CONTRAST TECHNIQUE: Multidetector CT imaging of the head and cervical spine was performed following the standard protocol without intravenous contrast. Multiplanar CT image reconstructions of the cervical spine were also generated. COMPARISON:  CT head and cervical spine 08/31/2020. FINDINGS: CT HEAD FINDINGS Brain: Mild atrophy and moderate diffuse white matter disease is stable. Remote lacunar infarcts involving the right putamen and left internal capsule are stable. No acute infarct, hemorrhage, or mass lesion is present. The ventricles are proportionate to the degree of atrophy. No significant extraaxial fluid collection is present. A remote lacunar infarct of the left cerebellum is stable. The brainstem and cerebellum are otherwise within normal limits. Vascular: Atherosclerotic calcifications are present within the cavernous internal carotid arteries bilaterally. No hyperdense vessel is present. Skull: Left supraorbital scalp laceration is present. No underlying fracture is present. No foreign body is present. Sinuses/Orbits: The paranasal sinuses and mastoid air cells are clear. The globes and orbits are within normal limits. CT CERVICAL SPINE FINDINGS Alignment: Degenerative anterolisthesis C3-4 and C4-5  stable. Is straightening of the normal cervical lordosis is stable slight anterolisthesis of C7-T1 is stable. Skull base and vertebrae: Advanced degenerative changes are present at C1-2. Two body heights are maintained. No acute fractures are present. Acquired fusion noted at C4-5. Soft tissues and spinal canal: No prevertebral fluid or swelling. No visible canal hematoma. Disc levels: Right-sided facet hypertrophy is greatest at C3-4 contributing to severe right foraminal narrowing. Moderate foraminal narrowing is present bilaterally at C4-5, C5-6 and C6-7. Upper chest: Scarring of the right apex is stable. Emphysematous changes again noted. IMPRESSION: 1. Left supraorbital scalp laceration without underlying fracture. 2. Stable atrophy and moderate diffuse white matter disease. This likely reflects the sequela of chronic microvascular ischemia. 3. Remote lacunar infarcts of the right putamen and left internal capsule are stable. 4. No acute intracranial abnormality or significant interval change. 5. Stable multilevel degenerative changes of the cervical spine as described. 6. No acute fracture or traumatic subluxation of the cervical spine. 7.  Emphysema (ICD10-J43.9). Electronically Signed   By: San Morelle M.D.   On: 10/26/2020 16:48   CT Cervical Spine Wo  Contrast  Result Date: 10/26/2020 CLINICAL DATA:  Fall 4 days ago.  Bruising to chest and back. EXAM: CT HEAD WITHOUT CONTRAST CT CERVICAL SPINE WITHOUT CONTRAST TECHNIQUE: Multidetector CT imaging of the head and cervical spine was performed following the standard protocol without intravenous contrast. Multiplanar CT image reconstructions of the cervical spine were also generated. COMPARISON:  CT head and cervical spine 08/31/2020. FINDINGS: CT HEAD FINDINGS Brain: Mild atrophy and moderate diffuse white matter disease is stable. Remote lacunar infarcts involving the right putamen and left internal capsule are stable. No acute infarct, hemorrhage,  or mass lesion is present. The ventricles are proportionate to the degree of atrophy. No significant extraaxial fluid collection is present. A remote lacunar infarct of the left cerebellum is stable. The brainstem and cerebellum are otherwise within normal limits. Vascular: Atherosclerotic calcifications are present within the cavernous internal carotid arteries bilaterally. No hyperdense vessel is present. Skull: Left supraorbital scalp laceration is present. No underlying fracture is present. No foreign body is present. Sinuses/Orbits: The paranasal sinuses and mastoid air cells are clear. The globes and orbits are within normal limits. CT CERVICAL SPINE FINDINGS Alignment: Degenerative anterolisthesis C3-4 and C4-5 stable. Is straightening of the normal cervical lordosis is stable slight anterolisthesis of C7-T1 is stable. Skull base and vertebrae: Advanced degenerative changes are present at C1-2. Two body heights are maintained. No acute fractures are present. Acquired fusion noted at C4-5. Soft tissues and spinal canal: No prevertebral fluid or swelling. No visible canal hematoma. Disc levels: Right-sided facet hypertrophy is greatest at C3-4 contributing to severe right foraminal narrowing. Moderate foraminal narrowing is present bilaterally at C4-5, C5-6 and C6-7. Upper chest: Scarring of the right apex is stable. Emphysematous changes again noted. IMPRESSION: 1. Left supraorbital scalp laceration without underlying fracture. 2. Stable atrophy and moderate diffuse white matter disease. This likely reflects the sequela of chronic microvascular ischemia. 3. Remote lacunar infarcts of the right putamen and left internal capsule are stable. 4. No acute intracranial abnormality or significant interval change. 5. Stable multilevel degenerative changes of the cervical spine as described. 6. No acute fracture or traumatic subluxation of the cervical spine. 7.  Emphysema (ICD10-J43.9). Electronically Signed   By:  San Morelle M.D.   On: 10/26/2020 16:48   CT CHEST ABDOMEN PELVIS W CONTRAST  Addendum Date: 10/26/2020   ADDENDUM REPORT: 10/26/2020 17:41 ADDENDUM: Aortic Atherosclerosis (ICD10-I70.0) and Emphysema (ICD10-J43.9). Electronically Signed   By: Ronney Asters M.D.   On: 10/26/2020 17:41   Result Date: 10/26/2020 CLINICAL DATA:  Fall 2 days ago.  Chest, back and breast pain. EXAM: CT CHEST, ABDOMEN, AND PELVIS WITH CONTRAST TECHNIQUE: Multidetector CT imaging of the chest, abdomen and pelvis was performed following the standard protocol during bolus administration of intravenous contrast. CONTRAST:  55m OMNIPAQUE IOHEXOL 350 MG/ML SOLN COMPARISON:  CT abdomen and pelvis 07/27/2010. CT chest 07/12/2019. CT CHEST FINDINGS Cardiovascular: There are atherosclerotic calcifications of the aorta. Normal heart size. No pericardial effusion. Mediastinum/Nodes: No enlarged mediastinal, hilar, or axillary lymph nodes. Thyroid gland, trachea, and esophagus demonstrate no significant findings. Lungs/Pleura: There are severe emphysematous changes in the upper lungs. There is secretions in the right mainstem bronchus and right middle lobe bronchus. There is collapse of the right middle lobe. Some air bronchograms are present. There is no pleural effusion. There is some nodular scarring in the right lung apex measuring 1.5 by 1.2 cm. This is unchanged from the 2021 study. There is chronic appearing bronchiectasis in the lingula. Musculoskeletal: There  is a comminuted fracture of the medial aspect of the left clavicle. Fracture fragments are displaced 1.8 cm. There is soft tissue swelling and hematoma surrounding the fracture site. There is no dislocation. There are no displaced rib fractures. There is mild compression deformity of the superior endplate of T5 without retropulsion of fracture fragments. This is new from 2021. There is edema in the subcutaneous tissues of the left breast which may be posttraumatic.  Bilateral breast implants are present there is mild deformity along the anterior inferior margin of the left breast implant which appears new from the 2021 study. CT ABDOMEN PELVIS FINDINGS Hepatobiliary: Left-sided liver cyst is unchanged from the prior examination. There are 2 hypodensities in the right lobe of the liver which are new from 2012 and indeterminate. These measure less than 1 cm and are too small to characterize (image 2/58 and 62). The gallbladder surgically absent. There is intrahepatic biliary ductal dilatation more prominent on the left. This is new from 2012. Common bile duct is enlarged measuring 12 mm. Pancreas: Unremarkable. No pancreatic ductal dilatation or surrounding inflammatory changes. Spleen: Normal in size without focal abnormality. Adrenals/Urinary Tract: Adrenal glands are unremarkable. Kidneys are normal, without renal calculi, focal lesion, or hydronephrosis. Bladder is unremarkable. Stomach/Bowel: Stomach is within normal limits. Appendix not seen. No evidence of bowel wall thickening, distention, or inflammatory changes. Vascular/Lymphatic: Aortic atherosclerosis. No enlarged abdominal or pelvic lymph nodes. Reproductive: Status post hysterectomy. No adnexal masses. Other: No abdominal wall hernia or abnormality. No abdominopelvic ascites. Musculoskeletal: There are healed right inferior and superior pubic rami fractures. No acute fractures are seen. There is new grade 1 anterolisthesis at L4-L5, likely degenerative. There is severe degenerative changes at L5-S1, progressed from prior examination. IMPRESSION: 1. Comminuted displaced medial left clavicular fracture with surrounding edema and hematoma. 2. There is diffuse subcutaneous edema in the left breast with some new deformity of the inferior left breast implant. Breast implant rupture is a possibility. 3. There are secretions in the right mainstem bronchus and right middle lobe bronchus with collapse of the right middle  lobe. Findings may be related to aspiration with postobstructive collapse/pneumonia. Underlying bronchial lesion not excluded. Recommend clinical correlation and follow-up. 4. Severe emphysema.  Stable right apical nodular density. 5. Mild compression fracture superior endplate of T5, age indeterminate. 6. Patient is status post cholecystectomy. There is intra and extrahepatic biliary ductal dilatation. Although this can be normal status post cholecystectomy, correlation with lab values is recommended to exclude distal biliary obstruction. This can be further evaluated with MRCP or ERCP as clinically indicated. 7. 2 small indeterminate lesions in the right lobe of the liver. Given patient's history of cancer, further evaluation with nonemergent MRI recommended. Electronically Signed: By: Ronney Asters M.D. On: 10/26/2020 17:09   CT L-SPINE NO CHARGE  Result Date: 10/26/2020 CLINICAL DATA:  Back and buttocks pain after falling 4 days ago. EXAM: CT LUMBAR SPINE WITHOUT CONTRAST TECHNIQUE: Multiplanar CT image reconstructions of the lumbar spine were generated from the data acquired during CT of the chest, abdomen and pelvis obtained concurrently. This studies are dictated separately. COMPARISON:  Lumbar MRI 08/03/2019.  Abdominal CT 07/02/2020. FINDINGS: Segmentation: There are 5 lumbar type vertebral bodies. Alignment: Stable grade 1 degenerative anterolisthesis at L4-5. Otherwise normal. Vertebrae: No evidence of acute fracture or traumatic subluxation. Moderate facet arthropathy noted inferiorly. Paraspinal and other soft tissues: No significant paraspinal findings. Intra-abdominal findings are deferred to separate abdominopelvic CT report. Disc levels: Mild degenerative changes in the mid  to lower thoracic spine with anterior osteophytes. The disc heights are relatively preserved from T10-11 through L1-2, without resulting spinal stenosis or significant foraminal narrowing. L2-3: Chronic loss of disc height  with annular disc bulging, endplate osteophytes, facet and ligamentous hypertrophy. Resulting mild spinal stenosis and mild foraminal narrowing bilaterally. L3-4: Disc height is relatively preserved. Previously noted small disc protrusion in the left subarticular zone appears partially involuted. Mild facet and ligamentous hypertrophy. No significant spinal stenosis. L4-5: Advanced facet hypertrophy accounting for the grade 1 anterolisthesis. There is loss of disc height with annular disc bulging and uncovering. There is resulting severe spinal stenosis which appears similar to the previous MRI. Mild foraminal narrowing is present bilaterally. L5-S1: Mild disc bulging and endplate osteophytes asymmetric to the left. Moderate bilateral facet hypertrophy. Mild chronic left foraminal narrowing. IMPRESSION: 1. No evidence of acute lumbar spine fracture or traumatic subluxation. 2. Multilevel spondylosis, similar to previous MRI from 08/03/2019. At L4-5, there is advanced facet hypertrophy with a resulting grade 1 anterolisthesis, severe multifactorial spinal stenosis and mild foraminal narrowing bilaterally. 3. Mild spinal stenosis and mild foraminal narrowing at L2-3. 4. Abdominopelvic findings dictated separately. Electronically Signed   By: Richardean Sale M.D.   On: 10/26/2020 16:39   DG Shoulder Left  Result Date: 10/26/2020 CLINICAL DATA:  Left shoulder pain after a fall. EXAM: LEFT SHOULDER - 2+ VIEW COMPARISON:  MR shoulder dated 03/25/2019. FINDINGS: There is no evidence of fracture or dislocation. There is chronic resorption of the distal left clavicle. Soft tissues are unremarkable. IMPRESSION: No acute osseous injury. Electronically Signed   By: Zerita Boers M.D.   On: 10/26/2020 16:16   DG Foot Complete Left  Result Date: 10/26/2020 CLINICAL DATA:  Foot pain after a fall. EXAM: LEFT FOOT - COMPLETE 3+ VIEW COMPARISON:  None. FINDINGS: There is no evidence of fracture or dislocation. Degenerative  changes are seen around the first metatarsal phalangeal joint. Heterotopic ossification is noted near the first metatarsal head. Soft tissues are unremarkable. IMPRESSION: No acute osseous injury Electronically Signed   By: Zerita Boers M.D.   On: 10/26/2020 16:15    Procedures Procedures   Medications Ordered in ED Medications  sodium chloride 0.9 % bolus 1,000 mL (0 mLs Intravenous Stopped 10/26/20 1717)  morphine 4 MG/ML injection 4 mg (4 mg Intravenous Given 10/26/20 1508)  LORazepam (ATIVAN) tablet 1 mg (1 mg Oral Given 10/26/20 1621)  chlordiazePOXIDE (LIBRIUM) capsule 100 mg (100 mg Oral Given 10/26/20 1621)  iohexol (OMNIPAQUE) 350 MG/ML injection 85 mL (85 mLs Intravenous Contrast Given 10/26/20 1551)    ED Course  I have reviewed the triage vital signs and the nursing notes.  Pertinent labs & imaging results that were available during my care of the patient were reviewed by me and considered in my medical decision making (see chart for details).  Clinical Course as of 10/26/20 2128  Mon Oct 26, 2020  1616 Benzodiazepines(!): POSITIVE [CA]    Clinical Course User Index [CA] Karie Kirks   MDM Rules/Calculators/A&P                           75 year old female presents to the ED due to recurrent falls.  Last fall was 4 days ago.  Caregiver notes that patient fell twice in the middle of the night while attempting to get alcohol to help her sleep.  Patient admits to hitting her head.  No loss of consciousness. She is  not currently on any blood thinners. Patient notes she has had chronic diarrhea since February which she believes is causing her to become weak and fall. Patient also has a history of alcohol abuse, typically drink 4 shots daily. She drank prior to arrival.  Upon arrival, patient tachycardic 121 with otherwise reassuring vitals.  Unknown etiology of tachycardia.  Lower suspicion of alcohol withdrawal given patient drank prior to arrival.  Physical exam significant  for ecchymosis to anterior chest wall without deformity or crepitus.  No cervical, thoracic, or lumbar midline tenderness.  Tenderness throughout sacrum.  CT head, cervical spine, and chest ordered.  X-rays ordered to rule out bony fractures.  Routine labs given history of chronic diarrhea throughout electrolyte abnormalities. IVFs, morphine, Ativan, Librium given.  Discussed case with Dr. Tyrone Nine who evaluated patient at bedside and agrees with assessment and plan.  CMP significant for mild hyponatremia 132, hyperglycemia at 122, elevated AST at 44.  Normal renal function.  Ethanol level elevated 131.  UDS positive for benzodiazepines.  CBC reassuring with no leukocytosis and normal hemoglobin.  UA significant for small leukocytes and few bacteria. Patient denies any dysuria or frequency. Urine culture pending. Will hold on antibiotics at this time for UTI given my suspicion is low.   CT chest/abdomen personally reviewed which demonstrates: IMPRESSION:  1. Comminuted displaced medial left clavicular fracture with  surrounding edema and hematoma.  2. There is diffuse subcutaneous edema in the left breast with some  new deformity of the inferior left breast implant. Breast implant  rupture is a possibility.  3. There are secretions in the right mainstem bronchus and right  middle lobe bronchus with collapse of the right middle lobe.  Findings may be related to aspiration with postobstructive  collapse/pneumonia. Underlying bronchial lesion not excluded.  Recommend clinical correlation and follow-up.  4. Severe emphysema.  Stable right apical nodular density.  5. Mild compression fracture superior endplate of T5, age  indeterminate.  6. Patient is status post cholecystectomy. There is intra and  extrahepatic biliary ductal dilatation. Although this can be normal  status post cholecystectomy, correlation with lab values is  recommended to exclude distal biliary obstruction. This can be  further  evaluated with MRCP or ERCP as clinically indicated.  7. 2 small indeterminate lesions in the right lobe of the liver.  Given patient's history of cancer, further evaluation with  nonemergent MRI recommended.      Left wrist x-ray showed possible radial styloid fracture. No tenderness. Lower suspicion for fracture. Sling placed for left clavicle fracture. Sacrum x-ray shows S5 fracture. Advised patient to buy donut for fracture. CT head, cervical spine, and L-spine negative for any acute abnormalities.   Discussed with Dr. Donne Hazel with trauma who notes all injuries are non-emergent. She will need her breast implant removed by her plastic surgeon. He recommends pain management and pulmonary toilet. Shared decision making in regards to admission for pain control versus outpatient follow-up and patient prefers to be discharged with outpatient follow-up. Patient declined admission. Caregiver at bedside and is agreeable to plan.  Patient discharged with Augmentin due to possible aspiration pneumonia. advised patient to call her plastic surgeon tomorrow to schedule appointment for removal of implant.  Patient also given number for orthopedic surgeon given numerous fractures.  Patient discharged with Voltaren gel as needed for pain.  We will hold off on further pain medication and muscle relaxers due to alcohol abuse and concerns about further falls. Advised patient to follow-up with PCP within the  next week for further evaluation.   Final Clinical Impression(s) / ED Diagnoses Final diagnoses:  Trauma  Fall, initial encounter  Fracture of sacrum, unspecified fracture morphology, sequela  Fracture of unspecified part of left clavicle, initial encounter for closed fracture  Aspiration pneumonia of left lung, unspecified aspiration pneumonia type, unspecified part of lung ([place])    Rx / DC Orders ED Discharge Orders          Ordered    amoxicillin-clavulanate (AUGMENTIN) 875-125 MG tablet  Every 12  hours        10/26/20 1815    Incentive spirometry RT        10/26/20 1815             Karie Kirks 10/26/20 2128    Deno Etienne, DO 10/26/20 2155

## 2020-10-26 NOTE — Discharge Instructions (Addendum)
It was a pleasure taking care of you today. As discussed your images showed Broken left clavicle Implant rupture of left breast- please call your plastic surgeon to have implant removed Broken bone in sacrum- you may purchase a donut at the pharmacy to sit on Possible left wrist fracture Possible T5 compression fracture Possible aspiration pneumonia- I am sending you home with antibiotics. 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 Follow-up with your neurologist for further evaluation  Please follow-up with your PCP this week for further evaluation.  You may purchase over-the-counter Voltaren gel and Lidoderm patches for pain relief.  Continue to take over-the-counter ibuprofen or Tylenol as needed for pain.  Return to the ER for new or worsening symptoms.

## 2020-10-26 NOTE — ED Notes (Signed)
Pt left after receiving discharge paperwork.  Verbalizes understanding.  Ambulatory out of dept.

## 2020-10-27 ENCOUNTER — Telehealth: Payer: Self-pay | Admitting: Orthopaedic Surgery

## 2020-10-27 NOTE — Telephone Encounter (Signed)
Tried to call patient. No answer and mailbox is full. I was trying to schedule patient for an earlier appointment than next week.

## 2020-10-27 NOTE — Telephone Encounter (Signed)
Pt daughter called and is wondering if her mother can be seen sooner than Tuesday next week? Pt was seen in the ED on 10/26/2020.   CB [phone]

## 2020-10-28 ENCOUNTER — Emergency Department (HOSPITAL_COMMUNITY): Payer: Medicare Other

## 2020-10-28 ENCOUNTER — Other Ambulatory Visit: Payer: Self-pay

## 2020-10-28 ENCOUNTER — Emergency Department (HOSPITAL_COMMUNITY)
Admission: EM | Admit: 2020-10-28 | Discharge: 2020-10-28 | Disposition: A | Discharge status: Home / Self Care | Payer: Medicare Other | Attending: Emergency Medicine | Admitting: Emergency Medicine

## 2020-10-28 DIAGNOSIS — W19XXXD Unspecified fall, subsequent encounter: Secondary | ICD-10-CM | POA: Diagnosis not present

## 2020-10-28 DIAGNOSIS — Z85828 Personal history of other malignant neoplasm of skin: Secondary | ICD-10-CM | POA: Diagnosis not present

## 2020-10-28 DIAGNOSIS — S2239XD Fracture of one rib, unspecified side, subsequent encounter for fracture with routine healing: Secondary | ICD-10-CM

## 2020-10-28 DIAGNOSIS — Z853 Personal history of malignant neoplasm of breast: Secondary | ICD-10-CM | POA: Diagnosis not present

## 2020-10-28 DIAGNOSIS — F17210 Nicotine dependence, cigarettes, uncomplicated: Secondary | ICD-10-CM | POA: Diagnosis not present

## 2020-10-28 DIAGNOSIS — I10 Essential (primary) hypertension: Secondary | ICD-10-CM | POA: Insufficient documentation

## 2020-10-28 DIAGNOSIS — S2231XD Fracture of one rib, right side, subsequent encounter for fracture with routine healing: Secondary | ICD-10-CM | POA: Diagnosis not present

## 2020-10-28 DIAGNOSIS — S299XXD Unspecified injury of thorax, subsequent encounter: Secondary | ICD-10-CM | POA: Diagnosis present

## 2020-10-28 LAB — URINE CULTURE

## 2020-10-28 MED ORDER — LORAZEPAM 1 MG PO TABS
2.0000 mg | ORAL_TABLET | Freq: Once | ORAL | Status: AC
  Administered 2020-10-28: 2 mg via ORAL
  Filled 2020-10-28: qty 2

## 2020-10-28 MED ORDER — KETOROLAC TROMETHAMINE 60 MG/2ML IM SOLN
15.0000 mg | Freq: Once | INTRAMUSCULAR | Status: AC
  Administered 2020-10-28: 15 mg via INTRAMUSCULAR
  Filled 2020-10-28: qty 2

## 2020-10-28 MED ORDER — LIDOCAINE 5 % EX PTCH
1.0000 | MEDICATED_PATCH | CUTANEOUS | Status: DC
  Administered 2020-10-28: 1 via TRANSDERMAL
  Filled 2020-10-28 (×2): qty 1

## 2020-10-28 MED ORDER — OXYCODONE HCL 5 MG PO TABS
5.0000 mg | ORAL_TABLET | Freq: Once | ORAL | Status: DC
Start: 2020-10-28 — End: 2020-10-28
  Administered 2020-10-28: 5 mg via ORAL
  Filled 2020-10-28: qty 1

## 2020-10-28 MED ORDER — TRAMADOL HCL 50 MG PO TABS: 50.0000 mg | ORAL_TABLET | Freq: Two times a day (BID) | ORAL | 0 refills | Status: DC | PRN

## 2020-10-28 NOTE — ED Notes (Signed)
EDP  In to speak with pt and caregiver

## 2020-10-28 NOTE — ED Triage Notes (Signed)
Pt here back to the ED today after falling while intoxicated in [place] pt [facility] and did full [facility] up, pt back today with c/o pain and sob

## 2020-10-28 NOTE — ED Provider Notes (Signed)
[facility] EMERGENCY DEPARTMENT Provider Note   CSN: [id number] Arrival date & time: 10/28/20  1021     History Chief Complaint  Patient presents with   Mindy Young is a 75 y.o. female.  Patient here with ongoing pain after suffering some injuries after a fall several days ago.  Has known left clavicle fracture and right rib fracture.  Significant hematoma over her chest wall as well.  Movement makes it worse.  Nothing makes it better.  The history is provided by the patient.  Illness Severity:  Mild Timing:  Intermittent Progression:  Waxing and waning Chronicity:  New Associated symptoms: chest pain   Associated symptoms: no abdominal pain, no cough, no ear pain, no fever, no rash, no shortness of breath, no sore throat and no vomiting       Past Medical History:  Diagnosis Date   Alcohol abuse    Allergy    Anemia 06/29/2011   Anxiety    Arthritis    Breast cancer ([place])    right breast   C1 cervical fracture ([place]) 02/1997   Cancer ([place])    melanoma   Depression    Family history of breast cancer    Family history of prostate cancer    GERD (gastroesophageal reflux disease)    Hypertension    Tardive dyskinesia     Patient Active Problem List   Diagnosis Date Noted   Hypokalemia 06/14/2020   Seizure ([place]) 06/13/2020   Impingement syndrome of left shoulder 03/27/2019   Pain in left shoulder 02/21/2019   Rib pain on right side 02/21/2019   Low back pain 02/07/2019   GERD (gastroesophageal reflux disease) 12/09/2018   Anxiety 12/09/2018   Difficulty speaking 12/09/2018   Leukocytosis 12/09/2018   Seizure-like activity ([place]) 12/09/2018   Pelvic fracture ([place]) 11/25/2018   Pubic bone fracture ([place]) 11/24/2018   Left knee pain 03/29/2018   Unilateral primary osteoarthritis, left knee 03/29/2018   Genetic testing 01/24/2018   Family history of breast cancer    Family history of prostate cancer    Malignant neoplasm of  lower-inner quadrant of right breast of female, estrogen receptor positive ([place]) 10/24/2017   Anemia 06/29/2011   Hypertension    Hyponatremia 06/25/2011   Transaminitis 06/25/2011   Alcohol abuse 06/25/2011   Cough 06/25/2011   Cigarette smoker 06/25/2011    Past Surgical History:  Procedure Laterality Date   ABDOMINAL HYSTERECTOMY     ASPIRATION OF ABSCESS Right 11/20/2017   Procedure: ASPIRATION OF RIGHT AXILLARY SEROMA;  Surgeon: Rolm Bookbinder, MD;  Location: [place];  Service: General;  Laterality: Right;   AUGMENTATION MAMMAPLASTY Bilateral    biateral implants , approx 2015   BREAST LUMPECTOMY Right 11/02/2017   re-ex 11-20-17   BREAST LUMPECTOMY WITH RADIOACTIVE SEED AND SENTINEL LYMPH NODE BIOPSY Right 11/02/2017   Procedure: BREAST LUMPECTOMY WITH RADIOACTIVE SEED AND SENTINEL LYMPH NODE BIOPSY;  Surgeon: Rolm Bookbinder, MD;  Location: [place];  Service: General;  Laterality: Right;   CHOLECYSTECTOMY     collarbone     INCONTINENCE SURGERY     KNEE SURGERY     removal of cyst, repair of cartiledge   LAPAROSCOPY     for endometriosis   RE-EXCISION OF BREAST LUMPECTOMY Right 11/20/2017   Procedure: RE-EXCISION OF RIGHT BREAST MARGINS;  Surgeon: Rolm Bookbinder, MD;  Location: [place];  Service: General;  Laterality: Right;   ROTATOR CUFF  REPAIR     left     OB History   No obstetric history on file.     Family History  Problem Relation Age of Onset   COPD Mother    Prostate cancer Father 23       seed implant for treatment   Colon polyps Father        'a few'   Breast cancer Sister 54   Breast cancer Maternal Aunt        dx >50   Breast cancer Other 7       bilateral   Colon cancer Neg Hx     Social History   Tobacco Use   Smoking status: Every Day    Packs/day: 1.00    Types: Cigarettes   Smokeless tobacco: Never   Tobacco comments:    e-cigs  Vaping Use   Vaping Use: Former   Substance Use Topics   Alcohol use: Yes    Alcohol/week: 21.0 standard drinks    Types: 21 Standard drinks or equivalent per week    Comment: 2 per weekend day; denies during the week (not consistent with prior hx)   Drug use: No    Home Medications Prior to Admission medications   Medication Sig Start Date End Date Taking? Authorizing Provider  traMADol (ULTRAM) 50 MG tablet Take 1 tablet (50 mg total) by mouth every 12 (twelve) hours as needed for up to 6 doses. 10/28/20  Yes Isami Mehra, DO  amoxicillin-clavulanate (AUGMENTIN) 875-125 MG tablet Take 1 tablet by mouth every 12 (twelve) hours. 10/26/20   Suzy Bouchard, PA-C  cetirizine (ZYRTEC) 10 MG tablet Take 10 mg by mouth as needed for allergies.    [provider]  chlordiazePOXIDE (LIBRIUM) 25 MG capsule '50mg'$  PO TID x 1D, then 25-'50mg'$  PO BID X 1D, then 25-'50mg'$  PO QD X 1D 08/31/20   Lacretia Leigh, MD  Cholecalciferol (VITAMIN D3) 50 MCG (2000 UT) capsule Take 2,000 Units by mouth every morning. 04/03/20   [provider]  diphenhydrAMINE (BENADRYL) 25 MG tablet Take 25 mg by mouth at bedtime as needed for sleep.    [provider]  diphenoxylate-atropine (LOMOTIL) 2.5-0.025 MG tablet Take 1 tablet by mouth 4 (four) times daily as needed for diarrhea or loose stools. 08/26/20   Armbruster, Carlota Raspberry, MD  levETIRAcetam (KEPPRA) 1000 MG tablet Take 1 tablet (1,000 mg total) by mouth 2 (two) times daily. 06/15/20   Aline August, MD  loperamide (IMODIUM) 2 MG capsule Take by mouth. 06/23/20   [provider]  MAGNESIUM-OXIDE 400 (240 Mg) MG tablet Take 1 tablet by mouth daily. 07/17/20   [provider]  mirtazapine (REMERON) 15 MG tablet Take 15 mg by mouth at bedtime. 07/15/20   [provider]  Multiple Vitamin (MULTI VITAMIN DAILY) TABS Take 1 tablet by mouth every morning. 04/03/20   [provider]  oxybutynin (DITROPAN-XL) 10 MG 24 hr tablet Take 10 mg by mouth daily.   12/05/16   [provider]  pantoprazole (PROTONIX) 40 MG tablet Take 1 tablet by mouth daily. 07/01/20   [provider]  potassium chloride SA (KLOR-CON) 20 MEQ tablet Take 1 tablet (20 mEq total) by mouth 2 (two) times daily for 7 days. 07/06/20 08/03/20  Arnaldo Natal, MD  pyridoxine (B-6) 100 MG tablet Take 1 tablet (100 mg total) by mouth daily. 06/15/20   Aline August, MD  RA MELATONIN 10 MG TABS Take 10-20 mg by mouth at  bedtime as needed for sleep. 03/29/20   [provider]  TRELEGY ELLIPTA 100-62.5-25 MCG/INH AEPB Inhale 1 puff into the lungs every morning. Patient not taking: No sig reported 04/03/20   [provider]  TRINTELLIX 5 MG TABS tablet Take 5 mg by mouth daily. 05/13/20   [provider]  ZOFRAN 4 MG tablet Take 4 mg by mouth as needed for nausea/vomiting. 04/03/20   [provider]    Allergies    Percocet [oxycodone-acetaminophen]  Review of Systems   Review of Systems  Constitutional:  Negative for chills and fever.  HENT:  Negative for ear pain and sore throat.   Eyes:  Negative for pain and visual disturbance.  Respiratory:  Negative for cough and shortness of breath.   Cardiovascular:  Positive for chest pain. Negative for palpitations.  Gastrointestinal:  Negative for abdominal pain and vomiting.  Genitourinary:  Negative for dysuria and hematuria.  Musculoskeletal:  Positive for arthralgias. Negative for back pain.  Skin:  Positive for color change. Negative for rash.  Neurological:  Negative for seizures and syncope.  All other systems reviewed and are negative.  Physical Exam Updated Vital Signs BP (!) 142/104 (BP Location: Left Arm)   Pulse (!) 105   Temp 98.6 F (37 C) (Oral)   Resp 18   SpO2 95%   Physical Exam Vitals and nursing note reviewed.  Constitutional:      General: She is not in acute distress.    Appearance: She is well-developed. She is not ill-appearing.  HENT:     Head:  Normocephalic and atraumatic.  Eyes:     Conjunctiva/sclera: Conjunctivae normal.  Cardiovascular:     Rate and Rhythm: Normal rate and regular rhythm.     Heart sounds: No murmur heard. Pulmonary:     Effort: Pulmonary effort is normal. No respiratory distress.     Breath sounds: Normal breath sounds.  Abdominal:     Palpations: Abdomen is soft.     Tenderness: There is no abdominal tenderness.  Musculoskeletal:        General: Tenderness present.     Cervical back: Normal range of motion and neck supple.     Comments: Tenderness to left clavicle, tenderness to the right ribs  Skin:    General: Skin is warm and dry.     Findings: Bruising present.  Neurological:     Mental Status: She is alert.    ED Results / Procedures / Treatments   Labs (all labs ordered are listed, but only abnormal results are displayed) Labs Reviewed - No data to display  EKG None  Radiology DG Chest 2 View  Result Date: 10/28/2020 CLINICAL DATA:  [place]. Shortness of breath. EXAM: CHEST - 2 VIEW COMPARISON:  CT scan 10/26/2020 FINDINGS: The cardiac silhouette, mediastinal and hilar contours are within normal limits. Stable mild tortuosity and calcification of the thoracic aorta. Persistent focus of right middle lobe atelectasis. No pleural effusions or pneumothorax. The bony thorax is intact. The right tenth anterior rib fracture is again demonstrated. IMPRESSION: Persistent focus of right middle lobe atelectasis. Right tenth anterior rib fracture, stable. Electronically Signed   By: Marijo Sanes M.D.   On: 10/28/2020 11:10   DG Chest 2 View  Addendum Date: 10/26/2020   ADDENDUM REPORT: 10/26/2020 16:24 ADDENDUM: On comparison with the same day chest CT, the apparent lucency in the right lung base reflects right middle lobe atelectasis. There is no pneumothorax. Electronically Signed   By: Dorothea Ogle  Litton M.D.   On: 10/26/2020 16:24   Result Date: 10/26/2020 CLINICAL DATA:  Chest pain.  History of a fall 4  days ago. EXAM: CHEST - 2 VIEW COMPARISON:  Chest radiograph dated 10/06/2020. FINDINGS: The heart size is normal. Vascular calcifications are seen in the aortic arch. Both lungs are clear. Lucency at the right lung base likely represents an overlying skin fold as opposed to a moderately large pneumothorax. Degenerative changes are seen in the spine. IMPRESSION: Lucency at the right lung base likely represents an overlying skin fold as opposed to a moderately large pneumothorax. Aortic Atherosclerosis (ICD10-I70.0). Electronically Signed: By: Zerita Boers M.D. On: 10/26/2020 16:19   DG Sacrum/Coccyx  Result Date: 10/26/2020 CLINICAL DATA:  Sacral and coccyx pain after a fall 4 days ago. EXAM: SACRUM AND COCCYX - 2+ VIEW COMPARISON:  Pelvic radiographs dated 02/07/2019. FINDINGS: There is an apparent cortical discontinuity of the sacrum at the level of S5 which is favored to reflect an acute fracture. Degenerative changes are seen in the lumbar spine. Chronic deformity of the right inferior and right superior pubic rami reflect prior fractures. Mild degenerative changes are seen in both hips. Stool overlies the rectum. IMPRESSION: Likely acute fracture of the sacrum at the level of S5. Electronically Signed   By: Zerita Boers M.D.   On: 10/26/2020 16:23   DG Forearm Left  Result Date: 10/26/2020 CLINICAL DATA:  Fall with forearm pain. EXAM: LEFT FOREARM - 2 VIEW COMPARISON:  Left elbow radiographs dated 12/10/2010. FINDINGS: There is moderate narrowing and sclerosis of the radioscaphoid joint and a questionable step-off of the distal radius which may represent an acute fracture or chronic deformity. The scapholunate distance appears normal. Soft tissues are unremarkable. IMPRESSION: Questionable step-off of the distal radius with superimposed moderate degenerative changes may represent an acute fracture. Recommend wrist radiographs for further evaluation. Electronically Signed   By: Zerita Boers M.D.   On:  10/26/2020 16:14   DG Wrist Complete Left  Result Date: 10/26/2020 CLINICAL DATA:  Fall, injury EXAM: LEFT WRIST - COMPLETE 3+ VIEW COMPARISON:  10/26/2020 FINDINGS: Possible intra-articular subtle fracture deformity at the radial styloid. No malalignment. Advanced degenerative change at the distal radiocarpal joint. IMPRESSION: Possible subtle intra-articular fracture involving radial styloid, correlate for point tenderness. Limited by advanced degenerative changes in the region. Electronically Signed   By: Donavan Foil M.D.   On: 10/26/2020 17:08   CT HEAD WO CONTRAST (5MM)  Result Date: 10/26/2020 CLINICAL DATA:  Fall 4 days ago.  Bruising to chest and back. EXAM: CT HEAD WITHOUT CONTRAST CT CERVICAL SPINE WITHOUT CONTRAST TECHNIQUE: Multidetector CT imaging of the head and cervical spine was performed following the standard protocol without intravenous contrast. Multiplanar CT image reconstructions of the cervical spine were also generated. COMPARISON:  CT head and cervical spine 08/31/2020. FINDINGS: CT HEAD FINDINGS Brain: Mild atrophy and moderate diffuse white matter disease is stable. Remote lacunar infarcts involving the right putamen and left internal capsule are stable. No acute infarct, hemorrhage, or mass lesion is present. The ventricles are proportionate to the degree of atrophy. No significant extraaxial fluid collection is present. A remote lacunar infarct of the left cerebellum is stable. The brainstem and cerebellum are otherwise within normal limits. Vascular: Atherosclerotic calcifications are present within the cavernous internal carotid arteries bilaterally. No hyperdense vessel is present. Skull: Left supraorbital scalp laceration is present. No underlying fracture is present. No foreign body is present. Sinuses/Orbits: The paranasal sinuses and mastoid air cells  are clear. The globes and orbits are within normal limits. CT CERVICAL SPINE FINDINGS Alignment: Degenerative  anterolisthesis C3-4 and C4-5 stable. Is straightening of the normal cervical lordosis is stable slight anterolisthesis of C7-T1 is stable. Skull base and vertebrae: Advanced degenerative changes are present at C1-2. Two body heights are maintained. No acute fractures are present. Acquired fusion noted at C4-5. Soft tissues and spinal canal: No prevertebral fluid or swelling. No visible canal hematoma. Disc levels: Right-sided facet hypertrophy is greatest at C3-4 contributing to severe right foraminal narrowing. Moderate foraminal narrowing is present bilaterally at C4-5, C5-6 and C6-7. Upper chest: Scarring of the right apex is stable. Emphysematous changes again noted. IMPRESSION: 1. Left supraorbital scalp laceration without underlying fracture. 2. Stable atrophy and moderate diffuse white matter disease. This likely reflects the sequela of chronic microvascular ischemia. 3. Remote lacunar infarcts of the right putamen and left internal capsule are stable. 4. No acute intracranial abnormality or significant interval change. 5. Stable multilevel degenerative changes of the cervical spine as described. 6. No acute fracture or traumatic subluxation of the cervical spine. 7.  Emphysema (ICD10-J43.9). Electronically Signed   By: San Morelle M.D.   On: 10/26/2020 16:48   CT Cervical Spine Wo Contrast  Result Date: 10/26/2020 CLINICAL DATA:  Fall 4 days ago.  Bruising to chest and back. EXAM: CT HEAD WITHOUT CONTRAST CT CERVICAL SPINE WITHOUT CONTRAST TECHNIQUE: Multidetector CT imaging of the head and cervical spine was performed following the standard protocol without intravenous contrast. Multiplanar CT image reconstructions of the cervical spine were also generated. COMPARISON:  CT head and cervical spine 08/31/2020. FINDINGS: CT HEAD FINDINGS Brain: Mild atrophy and moderate diffuse white matter disease is stable. Remote lacunar infarcts involving the right putamen and left internal capsule are stable.  No acute infarct, hemorrhage, or mass lesion is present. The ventricles are proportionate to the degree of atrophy. No significant extraaxial fluid collection is present. A remote lacunar infarct of the left cerebellum is stable. The brainstem and cerebellum are otherwise within normal limits. Vascular: Atherosclerotic calcifications are present within the cavernous internal carotid arteries bilaterally. No hyperdense vessel is present. Skull: Left supraorbital scalp laceration is present. No underlying fracture is present. No foreign body is present. Sinuses/Orbits: The paranasal sinuses and mastoid air cells are clear. The globes and orbits are within normal limits. CT CERVICAL SPINE FINDINGS Alignment: Degenerative anterolisthesis C3-4 and C4-5 stable. Is straightening of the normal cervical lordosis is stable slight anterolisthesis of C7-T1 is stable. Skull base and vertebrae: Advanced degenerative changes are present at C1-2. Two body heights are maintained. No acute fractures are present. Acquired fusion noted at C4-5. Soft tissues and spinal canal: No prevertebral fluid or swelling. No visible canal hematoma. Disc levels: Right-sided facet hypertrophy is greatest at C3-4 contributing to severe right foraminal narrowing. Moderate foraminal narrowing is present bilaterally at C4-5, C5-6 and C6-7. Upper chest: Scarring of the right apex is stable. Emphysematous changes again noted. IMPRESSION: 1. Left supraorbital scalp laceration without underlying fracture. 2. Stable atrophy and moderate diffuse white matter disease. This likely reflects the sequela of chronic microvascular ischemia. 3. Remote lacunar infarcts of the right putamen and left internal capsule are stable. 4. No acute intracranial abnormality or significant interval change. 5. Stable multilevel degenerative changes of the cervical spine as described. 6. No acute fracture or traumatic subluxation of the cervical spine. 7.  Emphysema (ICD10-J43.9).  Electronically Signed   By: San Morelle M.D.   On: 10/26/2020 16:48  CT CHEST ABDOMEN PELVIS W CONTRAST  Addendum Date: 10/27/2020   ADDENDUM REPORT: 10/27/2020 15:01 ADDENDUM: Upon review of CT chest abdomen pelvis performed on 10/26/2020, the following findings are noted: Nondisplaced acute right lateral tenth rib fracture. Electronically Signed   By: Ronney Asters M.D.   On: 10/27/2020 15:01   Addendum Date: 10/26/2020   ADDENDUM REPORT: 10/26/2020 17:41 ADDENDUM: Aortic Atherosclerosis (ICD10-I70.0) and Emphysema (ICD10-J43.9). Electronically Signed   By: Ronney Asters M.D.   On: 10/26/2020 17:41   Result Date: 10/27/2020 CLINICAL DATA:  Fall 2 days ago.  Chest, back and breast pain. EXAM: CT CHEST, ABDOMEN, AND PELVIS WITH CONTRAST TECHNIQUE: Multidetector CT imaging of the chest, abdomen and pelvis was performed following the standard protocol during bolus administration of intravenous contrast. CONTRAST:  46m OMNIPAQUE IOHEXOL 350 MG/ML SOLN COMPARISON:  CT abdomen and pelvis 07/27/2010. CT chest 07/12/2019. CT CHEST FINDINGS Cardiovascular: There are atherosclerotic calcifications of the aorta. Normal heart size. No pericardial effusion. Mediastinum/Nodes: No enlarged mediastinal, hilar, or axillary lymph nodes. Thyroid gland, trachea, and esophagus demonstrate no significant findings. Lungs/Pleura: There are severe emphysematous changes in the upper lungs. There is secretions in the right mainstem bronchus and right middle lobe bronchus. There is collapse of the right middle lobe. Some air bronchograms are present. There is no pleural effusion. There is some nodular scarring in the right lung apex measuring 1.5 by 1.2 cm. This is unchanged from the 2021 study. There is chronic appearing bronchiectasis in the lingula. Musculoskeletal: There is a comminuted fracture of the medial aspect of the left clavicle. Fracture fragments are displaced 1.8 cm. There is soft tissue swelling and hematoma  surrounding the fracture site. There is no dislocation. There are no displaced rib fractures. There is mild compression deformity of the superior endplate of T5 without retropulsion of fracture fragments. This is new from 2021. There is edema in the subcutaneous tissues of the left breast which may be posttraumatic. Bilateral breast implants are present there is mild deformity along the anterior inferior margin of the left breast implant which appears new from the 2021 study. CT ABDOMEN PELVIS FINDINGS Hepatobiliary: Left-sided liver cyst is unchanged from the prior examination. There are 2 hypodensities in the right lobe of the liver which are new from 2012 and indeterminate. These measure less than 1 cm and are too small to characterize (image 2/58 and 62). The gallbladder surgically absent. There is intrahepatic biliary ductal dilatation more prominent on the left. This is new from 2012. Common bile duct is enlarged measuring 12 mm. Pancreas: Unremarkable. No pancreatic ductal dilatation or surrounding inflammatory changes. Spleen: Normal in size without focal abnormality. Adrenals/Urinary Tract: Adrenal glands are unremarkable. Kidneys are normal, without renal calculi, focal lesion, or hydronephrosis. Bladder is unremarkable. Stomach/Bowel: Stomach is within normal limits. Appendix not seen. No evidence of bowel wall thickening, distention, or inflammatory changes. Vascular/Lymphatic: Aortic atherosclerosis. No enlarged abdominal or pelvic lymph nodes. Reproductive: Status post hysterectomy. No adnexal masses. Other: No abdominal wall hernia or abnormality. No abdominopelvic ascites. Musculoskeletal: There are healed right inferior and superior pubic rami fractures. No acute fractures are seen. There is new grade 1 anterolisthesis at L4-L5, likely degenerative. There is severe degenerative changes at L5-S1, progressed from prior examination. IMPRESSION: 1. Comminuted displaced medial left clavicular fracture  with surrounding edema and hematoma. 2. There is diffuse subcutaneous edema in the left breast with some new deformity of the inferior left breast implant. Breast implant rupture is a possibility. 3. There are secretions  in the right mainstem bronchus and right middle lobe bronchus with collapse of the right middle lobe. Findings may be related to aspiration with postobstructive collapse/pneumonia. Underlying bronchial lesion not excluded. Recommend clinical correlation and follow-up. 4. Severe emphysema.  Stable right apical nodular density. 5. Mild compression fracture superior endplate of T5, age indeterminate. 6. Patient is status post cholecystectomy. There is intra and extrahepatic biliary ductal dilatation. Although this can be normal status post cholecystectomy, correlation with lab values is recommended to exclude distal biliary obstruction. This can be further evaluated with MRCP or ERCP as clinically indicated. 7. 2 small indeterminate lesions in the right lobe of the liver. Given patient's history of cancer, further evaluation with nonemergent MRI recommended. Electronically Signed: By: Ronney Asters M.D. On: 10/26/2020 17:09   CT L-SPINE NO CHARGE  Result Date: 10/26/2020 CLINICAL DATA:  Back and buttocks pain after falling 4 days ago. EXAM: CT LUMBAR SPINE WITHOUT CONTRAST TECHNIQUE: Multiplanar CT image reconstructions of the lumbar spine were generated from the data acquired during CT of the chest, abdomen and pelvis obtained concurrently. This studies are dictated separately. COMPARISON:  Lumbar MRI 08/03/2019.  Abdominal CT 07/02/2020. FINDINGS: Segmentation: There are 5 lumbar type vertebral bodies. Alignment: Stable grade 1 degenerative anterolisthesis at L4-5. Otherwise normal. Vertebrae: No evidence of acute fracture or traumatic subluxation. Moderate facet arthropathy noted inferiorly. Paraspinal and other soft tissues: No significant paraspinal findings. Intra-abdominal findings are  deferred to separate abdominopelvic CT report. Disc levels: Mild degenerative changes in the mid to lower thoracic spine with anterior osteophytes. The disc heights are relatively preserved from T10-11 through L1-2, without resulting spinal stenosis or significant foraminal narrowing. L2-3: Chronic loss of disc height with annular disc bulging, endplate osteophytes, facet and ligamentous hypertrophy. Resulting mild spinal stenosis and mild foraminal narrowing bilaterally. L3-4: Disc height is relatively preserved. Previously noted small disc protrusion in the left subarticular zone appears partially involuted. Mild facet and ligamentous hypertrophy. No significant spinal stenosis. L4-5: Advanced facet hypertrophy accounting for the grade 1 anterolisthesis. There is loss of disc height with annular disc bulging and uncovering. There is resulting severe spinal stenosis which appears similar to the previous MRI. Mild foraminal narrowing is present bilaterally. L5-S1: Mild disc bulging and endplate osteophytes asymmetric to the left. Moderate bilateral facet hypertrophy. Mild chronic left foraminal narrowing. IMPRESSION: 1. No evidence of acute lumbar spine fracture or traumatic subluxation. 2. Multilevel spondylosis, similar to previous MRI from 08/03/2019. At L4-5, there is advanced facet hypertrophy with a resulting grade 1 anterolisthesis, severe multifactorial spinal stenosis and mild foraminal narrowing bilaterally. 3. Mild spinal stenosis and mild foraminal narrowing at L2-3. 4. Abdominopelvic findings dictated separately. Electronically Signed   By: Richardean Sale M.D.   On: 10/26/2020 16:39   DG Shoulder Left  Result Date: 10/26/2020 CLINICAL DATA:  Left shoulder pain after a fall. EXAM: LEFT SHOULDER - 2+ VIEW COMPARISON:  MR shoulder dated 03/25/2019. FINDINGS: There is no evidence of fracture or dislocation. There is chronic resorption of the distal left clavicle. Soft tissues are unremarkable.  IMPRESSION: No acute osseous injury. Electronically Signed   By: Zerita Boers M.D.   On: 10/26/2020 16:16   DG Foot Complete Left  Result Date: 10/26/2020 CLINICAL DATA:  Foot pain after a fall. EXAM: LEFT FOOT - COMPLETE 3+ VIEW COMPARISON:  None. FINDINGS: There is no evidence of fracture or dislocation. Degenerative changes are seen around the first metatarsal phalangeal joint. Heterotopic ossification is noted near the first metatarsal head. Soft  tissues are unremarkable. IMPRESSION: No acute osseous injury Electronically Signed   By: Zerita Boers M.D.   On: 10/26/2020 16:15    Procedures Procedures   Medications Ordered in ED Medications  ketorolac (TORADOL) injection 15 mg (has no administration in time range)  oxyCODONE (Oxy IR/ROXICODONE) immediate release tablet 5 mg (has no administration in time range)  lidocaine (LIDODERM) 5 % 1 patch (has no administration in time range)    ED Course  I have reviewed the triage vital signs and the nursing notes.  Pertinent labs & imaging results that were available during my care of the patient were reviewed by me and considered in my medical decision making (see chart for details).    MDM Rules/Calculators/A&P                           ARIBAH COWHERD is here for ongoing pain after fall several days ago.  She got full CT scans and imaging at [facility] 2 days ago.  Has left clavicle fracture, right rib fracture, significant ecchymosis to her chest wall.  She has issue with her breast implant and hematoma there that she is following up with her plastic surgeon for.  Upon chart review patient is known well alcoholic.  Tenuous to give her pain medication but given these injuries will give her a short course of tramadol.  We will give her shot of Toradol and Roxicodone here.  No new falls or injuries.  Chest x-ray was done in triage that was unremarkable and shows stable right-sided single rib fracture.  Strongly encouraged her to  follow-up with her primary care doctor to discuss further pain management.  Recommend lidocaine patches as well.  Discharged in good condition.  This chart was dictated using voice recognition software.  Despite best efforts to proofread,  errors can occur which can change the documentation meaning.   Final Clinical Impression(s) / ED Diagnoses Final diagnoses:  Closed fracture of one rib with routine healing, unspecified laterality, subsequent encounter    Rx / DC Orders ED Discharge Orders          Ordered    traMADol (ULTRAM) 50 MG tablet  Every 12 hours PRN        10/28/20 1442             Lennice Sites, DO 10/28/20 1450

## 2020-10-28 NOTE — ED Notes (Signed)
Pt complains of worsening pain. Triage RN notified.

## 2020-10-28 NOTE — ED Provider Notes (Signed)
Emergency Medicine Provider Triage Evaluation Note  NICOSHA BRIDWELL , a 75 y.o. female  was evaluated in triage.  Pt complains of fall and clavicle fracture   Review of Systems  Positive: Bruising  Negative: Fever   Physical Exam  BP (!) 136/93 (BP Location: Left Arm)   Pulse 99   Temp 98.6 F (37 C) (Oral)   Resp 20   SpO2 96%  Gen:   Awake, no distress   Resp:  Normal effort  MSK:   Moves extremities without difficulty  Other:  Large amount of brusing chest   Medical Decision Making  Medically screening exam initiated at 10:30 AM.  Appropriate orders placed.  FRED MCCANLESS was informed that the remainder of the evaluation will be completed by another provider, this initial triage assessment does not replace that evaluation, and the importance of remaining in the ED until their evaluation is complete.     Sidney Ace 10/28/20 1032    Carmin Muskrat, MD 10/28/20 [phone]

## 2020-10-28 NOTE — ED Notes (Signed)
States pain still 10/10 .  Seems much calmer.  Is now able to sit still

## 2020-10-29 ENCOUNTER — Telehealth: Payer: Self-pay | Admitting: Orthopaedic Surgery

## 2020-10-29 ENCOUNTER — Ambulatory Visit: Payer: Medicare Other | Admitting: Orthopaedic Surgery

## 2020-10-29 ENCOUNTER — Encounter: Payer: Self-pay | Admitting: Orthopaedic Surgery

## 2020-10-29 ENCOUNTER — Ambulatory Visit: Payer: Self-pay

## 2020-10-29 DIAGNOSIS — S42012A Anterior displaced fracture of sternal end of left clavicle, initial encounter for closed fracture: Secondary | ICD-10-CM | POA: Diagnosis not present

## 2020-10-29 DIAGNOSIS — S2231XA Fracture of one rib, right side, initial encounter for closed fracture: Secondary | ICD-10-CM | POA: Diagnosis not present

## 2020-10-29 DIAGNOSIS — M898X1 Other specified disorders of bone, shoulder: Secondary | ICD-10-CM | POA: Diagnosis not present

## 2020-10-29 DIAGNOSIS — S2239XA Fracture of one rib, unspecified side, initial encounter for closed fracture: Secondary | ICD-10-CM | POA: Insufficient documentation

## 2020-10-29 DIAGNOSIS — S42009A Fracture of unspecified part of unspecified clavicle, initial encounter for closed fracture: Secondary | ICD-10-CM | POA: Insufficient documentation

## 2020-10-29 MED ORDER — HYDROCODONE-ACETAMINOPHEN 5-325 MG PO TABS: 1.0000 | ORAL_TABLET | Freq: Four times a day (QID) | ORAL | 0 refills | Status: DC | PRN

## 2020-10-29 NOTE — Progress Notes (Signed)
Office Visit Note   Patient: Mindy Young           Date of Birth: 24-Jan-1946           MRN: [id number] Visit Date: 10/29/2020              Requested by: Cipriano Mile, NP [place],  [place] [address] PCP: Cipriano Mile, NP   Assessment & Plan: Visit Diagnoses:  1. Pain of left clavicle   2. Closed anterior displaced fracture of sternal end of left clavicle, initial encounter   3. Closed fracture of one rib of right side, initial encounter     Plan: [name] relates that she has frequent falls.  She recently within the last several days had a fall injuring multiple areas.  She has been seen in the emergency room on 2 occasions in the past several days with x-rays demonstrating a nondisplaced fracture of the right 10th rib and a fracture of the left clavicle near the sternal border.  There was a questionable fracture of the distal right radius styloid. She has multiple medical comorbidities including emphysema.  She notes that she has been so uncomfortable that she has been "drinking a lot". The wrist was not uncomfortable and does not need to be splinted.  We will just monitor this over time.  She is uncomfortable on the chest wall where she had a resolving ecchymosis.  She notes that she is not any more short of breath based on her emphysema.  Her lung was well-inflated.  The clavicle fracture is is displaced but at this point I think it is with monitoring it.  I do not think she is a good surgical candidate at this point based on all of the above.  I like to check her back in a week we will get a give her hydrocodone for pain  Follow-Up Instructions: Return in about 1 week (around 11/05/2020).   Orders:  Orders Placed This Encounter  Procedures   XR Clavicle Left   Meds ordered this encounter  Medications   HYDROcodone-acetaminophen (NORCO/VICODIN) 5-325 MG tablet    Sig: Take 1 tablet by mouth every 6 (six) hours as needed for moderate pain.    Dispense:  30 tablet     Refill:  0   HYDROcodone-acetaminophen (NORCO/VICODIN) 5-325 MG tablet    Sig: Take 1 tablet by mouth every 6 (six) hours as needed for moderate pain.    Dispense:  30 tablet    Refill:  0      Procedures: No procedures performed   Clinical Data: No additional findings.   Subjective: Chief Complaint  Patient presents with   Left Shoulder - Pain, Injury    DOI 10/22/2020  Patient presents today with multiple complaints. She states that she fell on 10/22/2020. She went to the ED and was told she had a left clavicle fracture and right rib fractures. She was given Tramadol to take, but states that it does nothing for her.  Has had 2 emergency room visits over the past several days related to her falls and injuries.  Details are well outlined in the chart.  HPI  Review of Systems   Objective: Vital Signs: There were no vitals taken for this visit.  Physical Exam Constitutional:      Appearance: She is well-developed.  Skin:    General: Skin is warm and dry.  Neurological:     Mental Status: She is alert and oriented to person, place,  and time.  Psychiatric:        Behavior: Behavior normal.    Ortho Exam awake and alert.  Walking around the examining room.  Smells of alcohol.  She relates to drinking to help with her pain.  Right wrist was not particularly uncomfortable.  She was able to grip and release.  She has some swelling and tenderness over the proximal left clavicle near the sternal border consistent with a fracture identified by plain film and CT scan.  She notes that her breathing is a little uncomfortable but no different than her usual emphysematous breathing.  Having some difficulty raising her left arm overhead related to the clavicle pain.  She is also having pain over the right chest wall in the area of the nondisplaced fracture.   Specialty Comments:  No specialty comments available.  Imaging: XR Clavicle Left  Result Date: 10/29/2020 Films of the left  clavicle obtained demonstrating a fracture at the sternal and.  There is some displacement.  I correlated this with the CT scan and there is about a centimeter and a half displacement.  It appears to be anterior    PMFS History: Patient Active Problem List   Diagnosis Date Noted   Clavicle fracture 10/29/2020   Rib fracture 10/29/2020   Hypokalemia 06/14/2020   Seizure ([place]) 06/13/2020   Impingement syndrome of left shoulder 03/27/2019   Pain in left shoulder 02/21/2019   Rib pain on right side 02/21/2019   Low back pain 02/07/2019   GERD (gastroesophageal reflux disease) 12/09/2018   Anxiety 12/09/2018   Difficulty speaking 12/09/2018   Leukocytosis 12/09/2018   Seizure-like activity ([place]) 12/09/2018   Pelvic fracture ([place]) 11/25/2018   Pubic bone fracture ([place]) 11/24/2018   Left knee pain 03/29/2018   Unilateral primary osteoarthritis, left knee 03/29/2018   Genetic testing 01/24/2018   Family history of breast cancer    Family history of prostate cancer    Malignant neoplasm of lower-inner quadrant of right breast of female, estrogen receptor positive ([place]) 10/24/2017   Anemia 06/29/2011   Hypertension    Hyponatremia 06/25/2011   Transaminitis 06/25/2011   Alcohol abuse 06/25/2011   Cough 06/25/2011   Cigarette smoker 06/25/2011   Past Medical History:  Diagnosis Date   Alcohol abuse    Allergy    Anemia 06/29/2011   Anxiety    Arthritis    Breast cancer ([place])    right breast   C1 cervical fracture ([place]) 02/1997   Cancer ([place])    melanoma   Depression    Family history of breast cancer    Family history of prostate cancer    GERD (gastroesophageal reflux disease)    Hypertension    Tardive dyskinesia     Family History  Problem Relation Age of Onset   COPD Mother    Prostate cancer Father 72       seed implant for treatment   Colon polyps Father        'a few'   Breast cancer Sister 48   Breast cancer Maternal Aunt        dx >50   Breast cancer Other  35       bilateral   Colon cancer Neg Hx     Past Surgical History:  Procedure Laterality Date   ABDOMINAL HYSTERECTOMY     ASPIRATION OF ABSCESS Right 11/20/2017   Procedure: ASPIRATION OF RIGHT AXILLARY SEROMA;  Surgeon: Rolm Bookbinder, MD;  Location: [facility];  Service:  General;  Laterality: Right;   AUGMENTATION MAMMAPLASTY Bilateral    biateral implants , approx 2015   BREAST LUMPECTOMY Right 11/02/2017   re-ex 11-20-17   BREAST LUMPECTOMY WITH RADIOACTIVE SEED AND SENTINEL LYMPH NODE BIOPSY Right 11/02/2017   Procedure: BREAST LUMPECTOMY WITH RADIOACTIVE SEED AND SENTINEL LYMPH NODE BIOPSY;  Surgeon: Rolm Bookbinder, MD;  Location: [facility];  Service: General;  Laterality: Right;   CHOLECYSTECTOMY     collarbone     INCONTINENCE SURGERY     KNEE SURGERY     removal of cyst, repair of cartiledge   LAPAROSCOPY     for endometriosis   RE-EXCISION OF BREAST LUMPECTOMY Right 11/20/2017   Procedure: RE-EXCISION OF RIGHT BREAST MARGINS;  Surgeon: Rolm Bookbinder, MD;  Location: [facility];  Service: General;  Laterality: Right;   ROTATOR CUFF REPAIR     left   Social History   Occupational History   Not on file  Tobacco Use   Smoking status: Every Day    Packs/day: 1.00    Types: Cigarettes   Smokeless tobacco: Never   Tobacco comments:    e-cigs  Vaping Use   Vaping Use: Former  Substance and Sexual Activity   Alcohol use: Yes    Alcohol/week: 21.0 standard drinks    Types: 21 Standard drinks or equivalent per week    Comment: 2 per weekend day; denies during the week (not consistent with prior hx)   Drug use: No   Sexual activity: Not on file

## 2020-10-29 NOTE — Telephone Encounter (Signed)
Pt called requesting for Lauren G to call [facility] on [address]. Pt stated they don't have her hydrocodone. Please call pt at [phone].

## 2020-10-29 NOTE — Telephone Encounter (Signed)
resent

## 2020-11-03 ENCOUNTER — Ambulatory Visit: Payer: Medicare Other | Admitting: Orthopaedic Surgery

## 2020-11-05 ENCOUNTER — Ambulatory Visit: Payer: Medicare Other | Admitting: Orthopaedic Surgery

## 2020-11-05 ENCOUNTER — Ambulatory Visit: Payer: Self-pay

## 2020-11-05 ENCOUNTER — Other Ambulatory Visit: Payer: Self-pay

## 2020-11-05 ENCOUNTER — Encounter: Payer: Self-pay | Admitting: Orthopaedic Surgery

## 2020-11-05 DIAGNOSIS — S2231XA Fracture of one rib, right side, initial encounter for closed fracture: Secondary | ICD-10-CM

## 2020-11-05 DIAGNOSIS — S42012A Anterior displaced fracture of sternal end of left clavicle, initial encounter for closed fracture: Secondary | ICD-10-CM

## 2020-11-05 MED ORDER — HYDROCODONE-ACETAMINOPHEN 5-325 MG PO TABS: 1.0000 | ORAL_TABLET | Freq: Four times a day (QID) | ORAL | 0 refills | Status: DC | PRN

## 2020-11-05 NOTE — Progress Notes (Signed)
Office Visit Note   Patient: Mindy Young           Date of Birth: [birth year]           MRN: [id number] Visit Date: 11/05/2020              Requested by: Cipriano Mile, NP [place],  [place] [address] PCP: Cipriano Mile, NP   Assessment & Plan: Visit Diagnoses:  1. Closed fracture of one rib of right side, initial encounter   2. Closed anterior displaced fracture of sternal end of left clavicle, initial encounter     Plan: Mindy Young is status post fall as outlined in her previous office notes.  She has a nondisplaced right lower rib fracture, proximal left clavicle fracture and a nondisplaced sacral fracture.  She has been on hydrocodone and notes that she has been "miserable" because she cannot find a comfortable position to sleep.  She also has a history of COPD but chest x-ray today did not demonstrate any changes nor pneumothorax.  Reassured her that she will improve.  I do not think that an ORIF of the proximal left clavicle fracture and is in her best interest at this point.  She has so many comorbidities and I think other than a small lump that she probably would not have any problems with that.  She is already regained significant motion of her left shoulder.  We will renew hydrocodone and see her in 2 weeks  Follow-Up Instructions: Return in about 2 weeks (around 11/19/2020).   Orders:  Orders Placed This Encounter  Procedures   XR Chest 2 View   Meds ordered this encounter  Medications   HYDROcodone-acetaminophen (NORCO/VICODIN) 5-325 MG tablet    Sig: Take 1 tablet by mouth every 6 (six) hours as needed for moderate pain.    Dispense:  30 tablet    Refill:  0      Procedures: No procedures performed   Clinical Data: No additional findings.   Subjective: Chief Complaint  Patient presents with   Left Shoulder - Follow-up    Clavicle fracture  Patient presents today for follow up on her left clavicle fracture and right rib fractures. She said  that she is miserable, with no improvement since her last visit. She has taken all her hydrocodone medicine.  Not having any more shortness of breath but having difficulty sleeping because she cannot find a comfortable position.  No new symptoms  HPI  Review of Systems   Objective: Vital Signs: There were no vitals taken for this visit.  Physical Exam Constitutional:      Appearance: She is well-developed.  Skin:    General: Skin is warm and dry.  Neurological:     Mental Status: She is alert and oriented to person, place, and time.  Psychiatric:        Behavior: Behavior normal.    Ortho Exam awake and alert.  Up walking in the examining room as it was more comfortable than sitting.  She does have some pain along the mid axillary line of the lower right rib cage consistent with her hip fracture.  Does not appear to be in any more shortness of breath than her normal COPD status.  Has less tenderness over the proximal left clavicle fracture and I was able to almost place her left arm over her head passively and abduct 90 degrees.  Good grip and good release.  Does have some tenderness over the sacrum consistent  with her nondisplaced fracture by CT scan.  Specialty Comments:  No specialty comments available.  Imaging: XR Chest 2 View  Result Date: 11/05/2020 A single AP of the chest was obtained.  There did not appear to be any evidence of pneumothorax.  Previously identified right rib fracture via CT scan was also not visualized.  There is a fracture of the proximal portion of the left clavicle which is less symptomatic    PMFS History: Patient Active Problem List   Diagnosis Date Noted   Clavicle fracture 10/29/2020   Rib fracture 10/29/2020   Hypokalemia 06/14/2020   Seizure ([place]) 06/13/2020   Impingement syndrome of left shoulder 03/27/2019   Pain in left shoulder 02/21/2019   Rib pain on right side 02/21/2019   Low back pain 02/07/2019   GERD (gastroesophageal reflux  disease) 12/09/2018   Anxiety 12/09/2018   Difficulty speaking 12/09/2018   Leukocytosis 12/09/2018   Seizure-like activity ([place]) 12/09/2018   Pelvic fracture ([place]) 11/25/2018   Pubic bone fracture ([place]) 11/24/2018   Left knee pain 03/29/2018   Unilateral primary osteoarthritis, left knee 03/29/2018   Genetic testing 01/24/2018   Family history of breast cancer    Family history of prostate cancer    Malignant neoplasm of lower-inner quadrant of right breast of female, estrogen receptor positive ([place]) 10/24/2017   Anemia 06/29/2011   Hypertension    Hyponatremia 06/25/2011   Transaminitis 06/25/2011   Alcohol abuse 06/25/2011   Cough 06/25/2011   Cigarette smoker 06/25/2011   Past Medical History:  Diagnosis Date   Alcohol abuse    Allergy    Anemia 06/29/2011   Anxiety    Arthritis    Breast cancer ([place])    right breast   C1 cervical fracture ([place]) 02/1997   Cancer ([place])    melanoma   Depression    Family history of breast cancer    Family history of prostate cancer    GERD (gastroesophageal reflux disease)    Hypertension    Tardive dyskinesia     Family History  Problem Relation Age of Onset   COPD Mother    Prostate cancer Father 72       seed implant for treatment   Colon polyps Father        'a few'   Breast cancer Sister 56   Breast cancer Maternal Aunt        dx >50   Breast cancer Other 35       bilateral   Colon cancer Neg Hx     Past Surgical History:  Procedure Laterality Date   ABDOMINAL HYSTERECTOMY     ASPIRATION OF ABSCESS Right 11/20/2017   Procedure: ASPIRATION OF RIGHT AXILLARY SEROMA;  Surgeon: Rolm Bookbinder, MD;  Location: [facility];  Service: General;  Laterality: Right;   AUGMENTATION MAMMAPLASTY Bilateral    biateral implants , approx 2015   BREAST LUMPECTOMY Right 11/02/2017   re-ex 11-20-17   BREAST LUMPECTOMY WITH RADIOACTIVE SEED AND SENTINEL LYMPH NODE BIOPSY Right 11/02/2017   Procedure: BREAST LUMPECTOMY  WITH RADIOACTIVE SEED AND SENTINEL LYMPH NODE BIOPSY;  Surgeon: Rolm Bookbinder, MD;  Location: [facility];  Service: General;  Laterality: Right;   CHOLECYSTECTOMY     collarbone     INCONTINENCE SURGERY     KNEE SURGERY     removal of cyst, repair of cartiledge   LAPAROSCOPY     for endometriosis   RE-EXCISION OF BREAST LUMPECTOMY Right 11/20/2017  Procedure: RE-EXCISION OF RIGHT BREAST MARGINS;  Surgeon: Rolm Bookbinder, MD;  Location: [facility];  Service: General;  Laterality: Right;   ROTATOR CUFF REPAIR     left   Social History   Occupational History   Not on file  Tobacco Use   Smoking status: Every Day    Packs/day: 1.00    Types: Cigarettes   Smokeless tobacco: Never   Tobacco comments:    e-cigs  Vaping Use   Vaping Use: Former  Substance and Sexual Activity   Alcohol use: Yes    Alcohol/week: 21.0 standard drinks    Types: 21 Standard drinks or equivalent per week    Comment: 2 per weekend day; denies during the week (not consistent with prior hx)   Drug use: No   Sexual activity: Not on file

## 2020-11-06 ENCOUNTER — Other Ambulatory Visit: Payer: Self-pay

## 2020-11-06 ENCOUNTER — Emergency Department (HOSPITAL_BASED_OUTPATIENT_CLINIC_OR_DEPARTMENT_OTHER): Payer: Medicare Other

## 2020-11-06 ENCOUNTER — Encounter (HOSPITAL_BASED_OUTPATIENT_CLINIC_OR_DEPARTMENT_OTHER): Payer: Self-pay | Admitting: Emergency Medicine

## 2020-11-06 ENCOUNTER — Emergency Department (HOSPITAL_BASED_OUTPATIENT_CLINIC_OR_DEPARTMENT_OTHER)
Admission: EM | Admit: 2020-11-06 | Discharge: 2020-11-06 | Disposition: A | Discharge status: Home / Self Care | Payer: Medicare Other | Attending: Emergency Medicine | Admitting: Emergency Medicine

## 2020-11-06 DIAGNOSIS — Z853 Personal history of malignant neoplasm of breast: Secondary | ICD-10-CM | POA: Insufficient documentation

## 2020-11-06 DIAGNOSIS — S299XXA Unspecified injury of thorax, initial encounter: Secondary | ICD-10-CM | POA: Diagnosis present

## 2020-11-06 DIAGNOSIS — R1012 Left upper quadrant pain: Secondary | ICD-10-CM | POA: Diagnosis not present

## 2020-11-06 DIAGNOSIS — F10129 Alcohol abuse with intoxication, unspecified: Secondary | ICD-10-CM | POA: Insufficient documentation

## 2020-11-06 DIAGNOSIS — I10 Essential (primary) hypertension: Secondary | ICD-10-CM | POA: Diagnosis not present

## 2020-11-06 DIAGNOSIS — S2020XA Contusion of thorax, unspecified, initial encounter: Secondary | ICD-10-CM | POA: Diagnosis not present

## 2020-11-06 DIAGNOSIS — Y92002 Bathroom of unspecified non-institutional (private) residence single-family (private) house as the place of occurrence of the external cause: Secondary | ICD-10-CM | POA: Insufficient documentation

## 2020-11-06 DIAGNOSIS — Z79899 Other long term (current) drug therapy: Secondary | ICD-10-CM | POA: Insufficient documentation

## 2020-11-06 DIAGNOSIS — F17210 Nicotine dependence, cigarettes, uncomplicated: Secondary | ICD-10-CM | POA: Insufficient documentation

## 2020-11-06 DIAGNOSIS — W19XXXA Unspecified fall, initial encounter: Secondary | ICD-10-CM | POA: Diagnosis not present

## 2020-11-06 DIAGNOSIS — R930 Abnormal findings on diagnostic imaging of skull and head, not elsewhere classified: Secondary | ICD-10-CM | POA: Insufficient documentation

## 2020-11-06 DIAGNOSIS — F10920 Alcohol use, unspecified with intoxication, uncomplicated: Secondary | ICD-10-CM

## 2020-11-06 DIAGNOSIS — S20212A Contusion of left front wall of thorax, initial encounter: Secondary | ICD-10-CM

## 2020-11-06 LAB — CBC WITH DIFFERENTIAL/PLATELET
Abs Immature Granulocytes: 0.06 10*3/uL (ref 0.00–0.07)
Basophils Absolute: 0.1 10*3/uL (ref 0.0–0.1)
Basophils Relative: 1 %
Eosinophils Absolute: 0 10*3/uL (ref 0.0–0.5)
Eosinophils Relative: 0 %
HCT: 35.3 % — ABNORMAL LOW (ref 36.0–46.0)
Hemoglobin: 12.3 g/dL (ref 12.0–15.0)
Immature Granulocytes: 1 %
Lymphocytes Relative: 16 %
Lymphs Abs: 1.2 10*3/uL (ref 0.7–4.0)
MCH: 36.6 pg — ABNORMAL HIGH (ref 26.0–34.0)
MCHC: 34.8 g/dL (ref 30.0–36.0)
MCV: 105.1 fL — ABNORMAL HIGH (ref 80.0–100.0)
Monocytes Absolute: 0.5 10*3/uL (ref 0.1–1.0)
Monocytes Relative: 7 %
Neutro Abs: 5.8 10*3/uL (ref 1.7–7.7)
Neutrophils Relative %: 75 %
Platelets: 351 10*3/uL (ref 150–400)
RBC: 3.36 MIL/uL — ABNORMAL LOW (ref 3.87–5.11)
RDW: 14.7 % (ref 11.5–15.5)
WBC: 7.7 10*3/uL (ref 4.0–10.5)
nRBC: 0 % (ref 0.0–0.2)

## 2020-11-06 LAB — COMPREHENSIVE METABOLIC PANEL
ALT: 22 U/L (ref 0–44)
AST: 42 U/L — ABNORMAL HIGH (ref 15–41)
Albumin: 3.6 g/dL (ref 3.5–5.0)
Alkaline Phosphatase: 111 U/L (ref 38–126)
Anion gap: 9 (ref 5–15)
BUN: 13 mg/dL (ref 8–23)
CO2: 27 mmol/L (ref 22–32)
Calcium: 8.7 mg/dL — ABNORMAL LOW (ref 8.9–10.3)
Chloride: 100 mmol/L (ref 98–111)
Creatinine, Ser: 0.76 mg/dL (ref 0.44–1.00)
GFR, Estimated: >60 mL/min (ref >60)
Glucose, Bld: 111 mg/dL — ABNORMAL HIGH (ref 70–99)
Potassium: 3.9 mmol/L (ref 3.5–5.1)
Sodium: 136 mmol/L (ref 135–145)
Total Bilirubin: 0.3 mg/dL (ref 0.3–1.2)
Total Protein: 6.4 g/dL — ABNORMAL LOW (ref 6.5–8.1)

## 2020-11-06 LAB — ETHANOL: Alcohol, Ethyl (B): 188 mg/dL — ABNORMAL HIGH (ref <10)

## 2020-11-06 MED ORDER — HYDROMORPHONE HCL 1 MG/ML IJ SOLN
0.5000 mg | Freq: Once | INTRAMUSCULAR | Status: AC
  Administered 2020-11-06: 0.5 mg via INTRAMUSCULAR

## 2020-11-06 MED ORDER — HYDROMORPHONE HCL 1 MG/ML IJ SOLN
0.5000 mg | Freq: Once | INTRAMUSCULAR | Status: DC
Start: 2020-11-06 — End: 2020-11-06
  Filled 2020-11-06: qty 1

## 2020-11-06 MED ORDER — IOHEXOL 350 MG/ML SOLN
100.0000 mL | Freq: Once | INTRAVENOUS | Status: AC | PRN
  Administered 2020-11-06: 100 mL via INTRAVENOUS

## 2020-11-06 NOTE — ED Triage Notes (Signed)
Patient arrived via [facility] c/o multiple falls. Patient found sitting on bathroom floor at home by home health nurse. Patient unable to express what happened except for pain on left side/back. Obvious bruising along left ribs. Patient seen recently for falls with dx of fx clavicle and right ribs. Probable ETOH use as supplement to prescribed pain medications per EMS. Patient is AO x 4, with elevated BP and decreased SPO2. Placed on 2L supplemental O2.

## 2020-11-06 NOTE — ED Provider Notes (Signed)
[facility] Provider Note   CSN: [id number] Arrival date & time: 11/06/20  1927     History Chief Complaint  Patient presents with   Back Pain   Fall    Mindy Young is a 75 y.o. female. Level 5 caveat due to altered mental status.  Back Pain Fall Patient presents with fall.  Reportedly fell in the bathroom per EMS although patient cannot really tell me what happened.  Says severe pain on the left ribs.  Over the last few weeks have been seen having issues after left clavicle fracture and a right rib fracture.  Seen by Dr. Durward Fortes yesterday I got new prescription for hydrocodone although I do not know if it is been filled yet.  Per EMS patient likely had been drinking alcohol along with her pain medicine.     Past Medical History:  Diagnosis Date   Alcohol abuse    Allergy    Anemia 06/29/2011   Anxiety    Arthritis    Breast cancer ([place])    right breast   C1 cervical fracture ([place]) 02/1997   Cancer ([place])    melanoma   Depression    Family history of breast cancer    Family history of prostate cancer    GERD (gastroesophageal reflux disease)    Hypertension    Tardive dyskinesia     Patient Active Problem List   Diagnosis Date Noted   Clavicle fracture 10/29/2020   Rib fracture 10/29/2020   Hypokalemia 06/14/2020   Seizure ([place]) 06/13/2020   Impingement syndrome of left shoulder 03/27/2019   Pain in left shoulder 02/21/2019   Rib pain on right side 02/21/2019   Low back pain 02/07/2019   GERD (gastroesophageal reflux disease) 12/09/2018   Anxiety 12/09/2018   Difficulty speaking 12/09/2018   Leukocytosis 12/09/2018   Seizure-like activity ([place]) 12/09/2018   Pelvic fracture ([place]) 11/25/2018   Pubic bone fracture ([place]) 11/24/2018   Left knee pain 03/29/2018   Unilateral primary osteoarthritis, left knee 03/29/2018   Genetic testing 01/24/2018   Family history of breast cancer    Family history of prostate cancer     Malignant neoplasm of lower-inner quadrant of right breast of female, estrogen receptor positive ([place]) 10/24/2017   Anemia 06/29/2011   Hypertension    Hyponatremia 06/25/2011   Transaminitis 06/25/2011   Alcohol abuse 06/25/2011   Cough 06/25/2011   Cigarette smoker 06/25/2011    Past Surgical History:  Procedure Laterality Date   ABDOMINAL HYSTERECTOMY     ASPIRATION OF ABSCESS Right 11/20/2017   Procedure: ASPIRATION OF RIGHT AXILLARY SEROMA;  Surgeon: Rolm Bookbinder, MD;  Location: [facility];  Service: General;  Laterality: Right;   AUGMENTATION MAMMAPLASTY Bilateral    biateral implants , approx 2015   BREAST LUMPECTOMY Right 11/02/2017   re-ex 11-20-17   BREAST LUMPECTOMY WITH RADIOACTIVE SEED AND SENTINEL LYMPH NODE BIOPSY Right 11/02/2017   Procedure: BREAST LUMPECTOMY WITH RADIOACTIVE SEED AND SENTINEL LYMPH NODE BIOPSY;  Surgeon: Rolm Bookbinder, MD;  Location: [facility];  Service: General;  Laterality: Right;   CHOLECYSTECTOMY     collarbone     INCONTINENCE SURGERY     KNEE SURGERY     removal of cyst, repair of cartiledge   LAPAROSCOPY     for endometriosis   RE-EXCISION OF BREAST LUMPECTOMY Right 11/20/2017   Procedure: RE-EXCISION OF RIGHT BREAST MARGINS;  Surgeon: Rolm Bookbinder, MD;  Location: [facility];  Service: General;  Laterality: Right;   ROTATOR CUFF REPAIR     left     OB History   No obstetric history on file.     Family History  Problem Relation Age of Onset   COPD Mother    Prostate cancer Father 34       seed implant for treatment   Colon polyps Father        'a few'   Breast cancer Sister 37   Breast cancer Maternal Aunt        dx >50   Breast cancer Other 75       bilateral   Colon cancer Neg Hx     Social History   Tobacco Use   Smoking status: Every Day    Packs/day: 1.00    Types: Cigarettes   Smokeless tobacco: Never   Tobacco comments:    e-cigs  Vaping Use    Vaping Use: Former  Substance Use Topics   Alcohol use: Yes    Alcohol/week: 21.0 standard drinks    Types: 21 Standard drinks or equivalent per week    Comment: 2 per weekend day; denies during the week (not consistent with prior hx)   Drug use: No    Home Medications Prior to Admission medications   Medication Sig Start Date End Date Taking? Authorizing Provider  amoxicillin-clavulanate (AUGMENTIN) 875-125 MG tablet Take 1 tablet by mouth every 12 (twelve) hours. 10/26/20   Suzy Bouchard, PA-C  cetirizine (ZYRTEC) 10 MG tablet Take 10 mg by mouth as needed for allergies.    [provider]  chlordiazePOXIDE (LIBRIUM) 25 MG capsule '50mg'$  PO TID x 1D, then 25-'50mg'$  PO BID X 1D, then 25-'50mg'$  PO QD X 1D 08/31/20   Lacretia Leigh, MD  Cholecalciferol (VITAMIN D3) 50 MCG (2000 UT) capsule Take 2,000 Units by mouth every morning. 04/03/20   [provider]  diphenhydrAMINE (BENADRYL) 25 MG tablet Take 25 mg by mouth at bedtime as needed for sleep.    [provider]  diphenoxylate-atropine (LOMOTIL) 2.5-0.025 MG tablet Take 1 tablet by mouth 4 (four) times daily as needed for diarrhea or loose stools. 08/26/20   Armbruster, Carlota Raspberry, MD  HYDROcodone-acetaminophen (NORCO/VICODIN) 5-325 MG tablet Take 1 tablet by mouth every 6 (six) hours as needed for moderate pain. 10/29/20   Garald Balding, MD  HYDROcodone-acetaminophen (NORCO/VICODIN) 5-325 MG tablet Take 1 tablet by mouth every 6 (six) hours as needed for moderate pain. 10/29/20   Garald Balding, MD  HYDROcodone-acetaminophen (NORCO/VICODIN) 5-325 MG tablet Take 1 tablet by mouth every 6 (six) hours as needed for moderate pain. 11/05/20   Garald Balding, MD  levETIRAcetam (KEPPRA) 1000 MG tablet Take 1 tablet (1,000 mg total) by mouth 2 (two) times daily. 06/15/20   Aline August, MD  loperamide (IMODIUM) 2 MG capsule Take by mouth. 06/23/20   [provider]  MAGNESIUM-OXIDE 400 (240 Mg) MG tablet Take 1  tablet by mouth daily. 07/17/20   [provider]  mirtazapine (REMERON) 15 MG tablet Take 15 mg by mouth at bedtime. 07/15/20   [provider]  Multiple Vitamin (MULTI VITAMIN DAILY) TABS Take 1 tablet by mouth every morning. 04/03/20   [provider]  oxybutynin (DITROPAN-XL) 10 MG 24 hr tablet Take 10 mg by mouth daily.  12/05/16   [provider]  pantoprazole (PROTONIX) 40 MG tablet Take 1 tablet by mouth daily. 07/01/20   [provider]  potassium chloride SA (  KLOR-CON) 20 MEQ tablet Take 1 tablet (20 mEq total) by mouth 2 (two) times daily for 7 days. 07/06/20 08/03/20  Arnaldo Natal, MD  pyridoxine (B-6) 100 MG tablet Take 1 tablet (100 mg total) by mouth daily. 06/15/20   Aline August, MD  RA MELATONIN 10 MG TABS Take 10-20 mg by mouth at bedtime as needed for sleep. 03/29/20   [provider]  traMADol (ULTRAM) 50 MG tablet Take 1 tablet (50 mg total) by mouth every 12 (twelve) hours as needed for up to 6 doses. 10/28/20   Curatolo, Adam, DO  TRELEGY ELLIPTA 100-62.5-25 MCG/INH AEPB Inhale 1 puff into the lungs every morning. 04/03/20   [provider]  TRINTELLIX 5 MG TABS tablet Take 5 mg by mouth daily. 05/13/20   [provider]  ZOFRAN 4 MG tablet Take 4 mg by mouth as needed for nausea/vomiting. 04/03/20   [provider]    Allergies    Percocet [oxycodone-acetaminophen]  Review of Systems   Review of Systems  Unable to perform ROS: Mental status change  Musculoskeletal:  Positive for back pain.   Physical Exam Updated Vital Signs BP (!) 180/102 (BP Location: Left Arm)   Pulse 93   Temp 98.2 F (36.8 C) (Oral)   Resp 20   Ht '5\' 2"'$  (1.575 m)   Wt 68.6 kg   SpO2 92%   BMI 27.67 kg/m   Physical Exam Vitals and nursing note reviewed.  Constitutional:      Comments: Patient appears somewhat disheveled.  HENT:     Head: Atraumatic.     Mouth/Throat:     Mouth: Mucous membranes are moist.   Eyes:     Pupils: Pupils are equal, round, and reactive to light.  Cardiovascular:     Rate and Rhythm: Regular rhythm.  Pulmonary:     Comments: Some tenderness to both the right and left chest wall.  Ecchymosis along the line on left anterior chest and lateral chest.  No crepitance.  Also left upper quadrant abdominal tenderness without ecchymosis. Chest:     Chest wall: Tenderness present.  Abdominal:     Tenderness: There is abdominal tenderness.  Musculoskeletal:        General: No tenderness.     Cervical back: Neck supple. No tenderness.  Skin:    General: Skin is warm.     Capillary Refill: Capillary refill takes less than 2 seconds.  Neurological:     Mental Status: She is alert.     Comments: Awake but mildly confused and has some slurred speech.    ED Results / Procedures / Treatments   Labs (all labs ordered are listed, but only abnormal results are displayed) Labs Reviewed  COMPREHENSIVE METABOLIC PANEL - Abnormal; Notable for the following components:      Result Value   Glucose, Bld 111 (*)    Calcium 8.7 (*)    Total Protein 6.4 (*)    AST 42 (*)    All other components within normal limits  CBC WITH DIFFERENTIAL/PLATELET - Abnormal; Notable for the following components:   RBC 3.36 (*)    HCT 35.3 (*)    MCV 105.1 (*)    MCH 36.6 (*)    All other components within normal limits  ETHANOL - Abnormal; Notable for the following components:   Alcohol, Ethyl (B) 188 (*)    All other components within normal limits    EKG None  Radiology CT HEAD WO CONTRAST (5MM)  Result Date: 11/06/2020 CLINICAL DATA:  Status post fall. EXAM: CT HEAD WITHOUT CONTRAST TECHNIQUE: Contiguous axial images were obtained from the base of the skull through the vertex without intravenous contrast. COMPARISON:  October 26, 2020 FINDINGS: Brain: There is mild cerebral atrophy with widening of the extra-axial spaces and ventricular dilatation. There are areas of decreased  attenuation within the white matter tracts of the supratentorial brain, consistent with microvascular disease changes. Vascular: No hyperdense vessel or unexpected calcification. Skull: Normal. Negative for fracture or focal lesion. Sinuses/Orbits: No acute finding. Other: None. IMPRESSION: 1. Mild cerebral atrophy and microvascular disease changes of the supratentorial brain. 2. No acute intracranial abnormality. Electronically Signed   By: Virgina Norfolk M.D.   On: 11/06/2020 22:32   CT Cervical Spine Wo Contrast  Result Date: 11/06/2020 CLINICAL DATA:  Status post fall. EXAM: CT CERVICAL SPINE WITHOUT CONTRAST TECHNIQUE: Multidetector CT imaging of the cervical spine was performed without intravenous contrast. Multiplanar CT image reconstructions were also generated. COMPARISON:  October 26, 2020 FINDINGS: Alignment: Approximately 3 mm anterolisthesis of the C4 vertebral body is noted on C5 with 3 mm retrolisthesis of C5 on C6. Skull base and vertebrae: No acute fracture. Chronic changes are seen involving the body and tip of the dens. No primary bone lesion or focal pathologic process. Soft tissues and spinal canal: No prevertebral fluid or swelling. No visible canal hematoma. Disc levels: Marked severity endplate sclerosis is seen at the levels of C4-C5, C5-C6 and C6-C7. There is marked severity narrowing of the anterior atlantoaxial articulation. Marked severity intervertebral disc space narrowing is seen at the levels of C4-C5, C5-C6 and C6-C7. Bilateral marked severity multilevel facet joint hypertrophy is noted with associated bilateral multilevel foraminal narrowing. Upper chest: Stable right apical scarring and/or atelectasis is seen. Other: None. IMPRESSION: 1. Marked severity multilevel degenerative changes, as described above. 2. No evidence of an acute fracture within the cervical spine. Electronically Signed   By: Virgina Norfolk M.D.   On: 11/06/2020 22:36   CT CHEST ABDOMEN PELVIS W  CONTRAST  Result Date: 11/06/2020 CLINICAL DATA:  Multiple falls. EXAM: CT CHEST, ABDOMEN, AND PELVIS WITH CONTRAST TECHNIQUE: Multidetector CT imaging of the chest, abdomen and pelvis was performed following the standard protocol during bolus administration of intravenous contrast. CONTRAST:  148m OMNIPAQUE IOHEXOL 350 MG/ML SOLN COMPARISON:  October 26, 2020 FINDINGS: CT CHEST FINDINGS Cardiovascular: There is marked severity calcification of the aortic arch, without evidence of aortic aneurysm or dissection. Normal heart size. No pericardial effusion. Mediastinum/Nodes: No enlarged mediastinal, hilar, or axillary lymph nodes. Thyroid gland, trachea, and esophagus demonstrate no significant findings. Lungs/Pleura: Focal scarring and/or atelectasis is noted within the right apex. This is seen on the prior study and is unchanged in appearance. There is marked severity bilateral emphysematous lung disease. Mild to moderate severity areas of atelectasis and/or infiltrate are seen within the medial aspect of the right middle lobe and posteromedial aspect of the right lower lobe. There is no evidence of a pleural effusion or pneumothorax. Musculoskeletal: Bilateral breast implants are noted. A displaced fracture of the proximal left clavicle is seen. This is present on the prior study. A chronic fracture involving the superior endplate of the T5 vertebral body is also noted. Multilevel degenerative changes seen throughout the thoracic spine. CT ABDOMEN PELVIS FINDINGS Hepatobiliary: An ill-defined cystic appearing structure is again seen within the left lobe of the liver. Stable 4 mm and 6 mm foci of parenchymal low attenuation are seen within the right  lobe (axial CT image 53 and 60, CT series 2). Mild to moderate severity central intrahepatic biliary dilatation is noted. Status post cholecystectomy. The common bile duct is dilated and measures 1.4 cm. Pancreas: Unremarkable. No pancreatic ductal dilatation or  surrounding inflammatory changes. Spleen: Normal in size without focal abnormality. Adrenals/Urinary Tract: Adrenal glands are unremarkable. Kidneys are normal, without renal calculi, focal lesion, or hydronephrosis. Bladder is unremarkable. Stomach/Bowel: Stomach is within normal limits. Appendix appears normal. No evidence of bowel wall thickening, distention, or inflammatory changes. Vascular/Lymphatic: Aortic atherosclerosis. No enlarged abdominal or pelvic lymph nodes. Reproductive: Status post hysterectomy. No adnexal masses. Other: No abdominal wall hernia or abnormality. No abdominopelvic ascites. Musculoskeletal: Chronic fracture deformities are seen involving the right superior and right inferior pubic rami. Stable anterolisthesis of the L4 vertebral body is noted on L5 with multilevel degenerative changes throughout the lumbar spine. IMPRESSION: 1. Mild to moderate severity right middle lobe and right lower lobe atelectasis and/or infiltrate. 2. Marked severity bilateral emphysematous lung disease. 3. Stable right apical scarring and/or atelectasis. 4. Small, stable indeterminate foci of parenchymal low attenuation within the right lobe of the liver. 5. Displaced fracture of the proximal left clavicle, unchanged in appearance when compared to the prior study. 6. Chronic fracture deformities involving the T5 vertebral body, as well as the right superior and right inferior pubic rami. Aortic Atherosclerosis (ICD10-I70.0) and Emphysema (ICD10-J43.9). Electronically Signed   By: Virgina Norfolk M.D.   On: 11/06/2020 22:51   DG Chest Portable 1 View  Result Date: 11/06/2020 CLINICAL DATA:  Fall EXAM: PORTABLE CHEST 1 VIEW COMPARISON:  11/05/2020 FINDINGS: The heart size and mediastinal contours are within normal limits. Both lungs are clear. The visualized skeletal structures are unremarkable. IMPRESSION: No active disease. Electronically Signed   By: Ulyses Jarred M.D.   On: 11/06/2020 20:30   XR  Chest 2 View  Result Date: 11/05/2020 A single AP of the chest was obtained.  There did not appear to be any evidence of pneumothorax.  Previously identified right rib fracture via CT scan was also not visualized.  There is a fracture of the proximal portion of the left clavicle which is less symptomatic   Procedures Procedures   Medications Ordered in ED Medications  HYDROmorphone (DILAUDID) injection 0.5 mg (0.5 mg Intramuscular Given 11/06/20 2126)  iohexol (OMNIPAQUE) 350 MG/ML injection 100 mL (100 mLs Intravenous Contrast Given 11/06/20 2233)    ED Course  I have reviewed the triage vital signs and the nursing notes.  Pertinent labs & imaging results that were available during my care of the patient were reviewed by me and considered in my medical decision making (see chart for details).    MDM Rules/Calculators/A&P                           Patient presents after fall.  Initially altered but has improved.  Likely secondary to alcohol intoxication.  Imaging reassuring.  No new injury.  Likely atelectasis right lungs.  Otherwise some previous fractures visualized.  Some ecchymosis to left chest wall.  Can take the pain medicines she had refilled yesterday from orthopedic surgery.  Discharge home.  Patient was instructed to not take alcohol and the pain medicine at the same time  Final Clinical Impression(s) / ED Diagnoses Final diagnoses:  Fall  Chest wall contusion, left, initial encounter  Alcoholic intoxication without complication ([place])    Rx / DC Orders ED Discharge Orders  None        Davonna Belling, MD 11/06/20 [phone]

## 2020-11-12 ENCOUNTER — Ambulatory Visit (INDEPENDENT_AMBULATORY_CARE_PROVIDER_SITE_OTHER): Payer: Medicare Other | Admitting: Physician Assistant

## 2020-11-12 ENCOUNTER — Encounter: Payer: Self-pay | Admitting: Physician Assistant

## 2020-11-12 VITALS — BP 140/100 | HR 120 | Ht 62.5 in | Wt 144.5 lb

## 2020-11-12 DIAGNOSIS — R197 Diarrhea, unspecified: Secondary | ICD-10-CM | POA: Diagnosis not present

## 2020-11-12 DIAGNOSIS — R932 Abnormal findings on diagnostic imaging of liver and biliary tract: Secondary | ICD-10-CM

## 2020-11-12 DIAGNOSIS — F1010 Alcohol abuse, uncomplicated: Secondary | ICD-10-CM

## 2020-11-12 DIAGNOSIS — Z1211 Encounter for screening for malignant neoplasm of colon: Secondary | ICD-10-CM

## 2020-11-12 DIAGNOSIS — Z1212 Encounter for screening for malignant neoplasm of rectum: Secondary | ICD-10-CM

## 2020-11-12 MED ORDER — DIPHENOXYLATE-ATROPINE 2.5-0.025 MG PO TABS: 1.0000 | ORAL_TABLET | Freq: Four times a day (QID) | ORAL | 3 refills | Status: DC | PRN

## 2020-11-12 NOTE — Patient Instructions (Addendum)
We have sent the following medications to your pharmacy for you to pick up at your convenience: Lomotil.  If you are age 75 or older, your body mass index should be between 23-30. Your Body mass index is 26.01 kg/m. If this is out of the aforementioned range listed, please consider follow up with your Primary Care Provider.  If you are age 41 or younger, your body mass index should be between 19-25. Your Body mass index is 26.01 kg/m. If this is out of the aformentioned range listed, please consider follow up with your Primary Care Provider.   __________________________________________________________  The [facility] providers would like to encourage you to use [facility] to communicate with providers for non-urgent requests or questions.  Due to long hold times on the telephone, sending your provider a message by [facility] may be a faster and more efficient way to get a response.  Please allow 48 business hours for a response.  Please remember that this is for non-urgent requests.

## 2020-11-12 NOTE — Progress Notes (Signed)
Chief Complaint: Follow-up diarrhea  HPI:     Mindy Young is a 75 year old female with a past medical history of alcohol abuse, breast cancer, depression, reflux and tardive dyskinesia, known to Dr. Silverio Decamp, who returns to clinic today for follow-up of her diarrhea.      08/2010 colonoscopy with 2 diminutive caliber plastic polyps removed from the rectum, small internal hemorrhoids and otherwise normal.  Repeat recommended in 10 years.    12/08/2016 office visit with Dr. Silverio Decamp for nausea, vomiting, IBS, weight loss and intermittent diarrhea.  At that time discussed patient was postcholecystectomy.  Had projectile vomiting.  At that time patient scheduled for EGD.  She was trialed on IBgard 1 capsule up to 3 times a day as needed.    12/09/2016 EGD with a small hiatal hernia and gastritis.  She started on pantoprazole 40 twice daily.  Pathology showed mild gastropathy and patchy infiltrates as discussed and need to rule out gluten intolerance or NSAID injury.  (It does not look like celiac testing was ever done)    06/13/2020 patient admitted to the hospital for a seizure.  She was not on medicine and started on Keppra.  She was post to follow with neurology in late May but missed this appointment.    07/02/2020 CT abdomen pelvis with contrast showed intra and extrahepatic bile ducts which are mildly more dilated in the interval with wall enhancement in the central intra and extrahepatic bile ducts which may be mild wall thickening concerning for possible cholangitis.    07/06/2020 patient seen in the ED for diarrhea.  At that time had been seen 4 days prior but left prior to being diagnosed.  Labs showed a potassium low at 3.3, sodium low at 133.  White count 10.9.  Urinalysis cloudy with some leukocytes, magnesium low.  C. difficile GI path panel and lipase normal.    07/29/2020 patient seen in clinic accompanied by her caretaker and described being diagnosed with COVID in February and ever since having  trouble with her GI system with increase in diarrhea and abdominal pain.  Associated symptoms included some nausea and occasional vomiting.  At that time schedule patient for a colonoscopy.  Discussed using Imodium as needed 1 tab every 6 hours.  Discussed that patient never finished work-up for celiac disease and there was some question of this in the past so could consider celiac lab testing if colonoscopy was unrevealing.  Also recommended she see neurology prior to scheduling colonoscopy so that we can have clearance from them given recent seizure.    11/06/2020 patient seen in the ED after a fall.  Work-up was essentially normal/negative and patient was told to not drink alcohol with her pain medicine.  CT of the abdomen pelvis at that time showed an ill-defined cystic-appearing structure again seen within the left lobe of the liver, stable 4 mm and 6 mm foci of low-attenuation seen in the right lobe.  Mild to moderate severity central intrahepatic biliary dilation was noted.  Status postcholecystectomy with common bile duct dilating 1.4 cm.  Labs at that time with an AST of 42 and other liver enzymes normal.  Ethanol level 188.    Today, the patient presents to clinic again with her caretaker.  She tells me that the diarrhea is actually better.  She is only requiring 1 Lomotil pill every 2 to 3 days.  She typically will just take it at the first sign of loose stool.  On the days in between  she has soft solid more formed stools.  She is happy with the improvement in her diarrhea.    They do ask questions in regards to recent CT of the abdomen as above showing cysts in the liver and dilation of the bile ducts.    Denies fever, chills, weight loss, abdominal pain, blood in her stool or symptoms that awaken her from sleep.   Past Medical History:  Diagnosis Date   Alcohol abuse    Allergy    Anemia 06/29/2011   Anxiety    Arthritis    Breast cancer ([place])    right breast   C1 cervical fracture ([place])  02/1997   Cancer ([place])    melanoma   Depression    Family history of breast cancer    Family history of prostate cancer    GERD (gastroesophageal reflux disease)    Hypertension    Tardive dyskinesia     Past Surgical History:  Procedure Laterality Date   ABDOMINAL HYSTERECTOMY     ASPIRATION OF ABSCESS Right 11/20/2017   Procedure: ASPIRATION OF RIGHT AXILLARY SEROMA;  Surgeon: Rolm Bookbinder, MD;  Location: [facility];  Service: General;  Laterality: Right;   AUGMENTATION MAMMAPLASTY Bilateral    biateral implants , approx 2015   BREAST LUMPECTOMY Right 11/02/2017   re-ex 11-20-17   BREAST LUMPECTOMY WITH RADIOACTIVE SEED AND SENTINEL LYMPH NODE BIOPSY Right 11/02/2017   Procedure: BREAST LUMPECTOMY WITH RADIOACTIVE SEED AND SENTINEL LYMPH NODE BIOPSY;  Surgeon: Rolm Bookbinder, MD;  Location: [facility];  Service: General;  Laterality: Right;   CHOLECYSTECTOMY     collarbone     INCONTINENCE SURGERY     KNEE SURGERY     removal of cyst, repair of cartiledge   LAPAROSCOPY     for endometriosis   RE-EXCISION OF BREAST LUMPECTOMY Right 11/20/2017   Procedure: RE-EXCISION OF RIGHT BREAST MARGINS;  Surgeon: Rolm Bookbinder, MD;  Location: [facility];  Service: General;  Laterality: Right;   ROTATOR CUFF REPAIR     left    Current Outpatient Medications  Medication Sig Dispense Refill   amoxicillin-clavulanate (AUGMENTIN) 875-125 MG tablet Take 1 tablet by mouth every 12 (twelve) hours. 14 tablet 0   cetirizine (ZYRTEC) 10 MG tablet Take 10 mg by mouth as needed for allergies.     chlordiazePOXIDE (LIBRIUM) 25 MG capsule 50mg  PO TID x 1D, then 25-50mg  PO BID X 1D, then 25-50mg  PO QD X 1D 10 capsule 0   Cholecalciferol (VITAMIN D3) 50 MCG (2000 UT) capsule Take 2,000 Units by mouth every morning.     diphenhydrAMINE (BENADRYL) 25 MG tablet Take 25 mg by mouth at bedtime as needed for sleep.     diphenoxylate-atropine (LOMOTIL)  2.5-0.025 MG tablet Take 1 tablet by mouth 4 (four) times daily as needed for diarrhea or loose stools. 30 tablet 1   HYDROcodone-acetaminophen (NORCO/VICODIN) 5-325 MG tablet Take 1 tablet by mouth every 6 (six) hours as needed for moderate pain. 30 tablet 0   HYDROcodone-acetaminophen (NORCO/VICODIN) 5-325 MG tablet Take 1 tablet by mouth every 6 (six) hours as needed for moderate pain. 30 tablet 0   HYDROcodone-acetaminophen (NORCO/VICODIN) 5-325 MG tablet Take 1 tablet by mouth every 6 (six) hours as needed for moderate pain. 30 tablet 0   levETIRAcetam (KEPPRA) 1000 MG tablet Take 1 tablet (1,000 mg total) by mouth 2 (two) times daily. 60 tablet 0   loperamide (IMODIUM) 2 MG capsule Take by mouth.  MAGNESIUM-OXIDE 400 (240 Mg) MG tablet Take 1 tablet by mouth daily.     mirtazapine (REMERON) 15 MG tablet Take 15 mg by mouth at bedtime.     Multiple Vitamin (MULTI VITAMIN DAILY) TABS Take 1 tablet by mouth every morning.     oxybutynin (DITROPAN-XL) 10 MG 24 hr tablet Take 10 mg by mouth daily.   0   pantoprazole (PROTONIX) 40 MG tablet Take 1 tablet by mouth daily.     potassium chloride SA (KLOR-CON) 20 MEQ tablet Take 1 tablet (20 mEq total) by mouth 2 (two) times daily for 7 days. 14 tablet 0   pyridoxine (B-6) 100 MG tablet Take 1 tablet (100 mg total) by mouth daily. 30 tablet 0   RA MELATONIN 10 MG TABS Take 10-20 mg by mouth at bedtime as needed for sleep.     traMADol (ULTRAM) 50 MG tablet Take 1 tablet (50 mg total) by mouth every 12 (twelve) hours as needed for up to 6 doses. 6 tablet 0   TRELEGY ELLIPTA 100-62.5-25 MCG/INH AEPB Inhale 1 puff into the lungs every morning.     TRINTELLIX 5 MG TABS tablet Take 5 mg by mouth daily.     ZOFRAN 4 MG tablet Take 4 mg by mouth as needed for nausea/vomiting.     No current facility-administered medications for this visit.    Allergies as of 11/12/2020 - Review Complete 11/06/2020  Allergen Reaction Noted   Percocet  [oxycodone-acetaminophen] Other (See Comments) 10/23/2014    Family History  Problem Relation Age of Onset   COPD Mother    Prostate cancer Father 38       seed implant for treatment   Colon polyps Father        'a few'   Breast cancer Sister 38   Breast cancer Maternal Aunt        dx >50   Breast cancer Other 31       bilateral   Colon cancer Neg Hx     Social History   Socioeconomic History   Marital status: Married    Spouse name: Not on file   Number of children: 2   Years of education: Not on file   Highest education level: Not on file  Occupational History   Not on file  Tobacco Use   Smoking status: Every Day    Packs/day: 1.00    Types: Cigarettes   Smokeless tobacco: Never   Tobacco comments:    e-cigs  Vaping Use   Vaping Use: Former  Substance and Sexual Activity   Alcohol use: Yes    Alcohol/week: 21.0 standard drinks    Types: 21 Standard drinks or equivalent per week    Comment: 2 per weekend day; denies during the week (not consistent with prior hx)   Drug use: No   Sexual activity: Not on file  Other Topics Concern   Not on file  Social History Narrative   Right Handed    Lives in a one story home   Social Determinants of Health   Financial Resource Strain: Not on file  Food Insecurity: Not on file  Transportation Needs: Not on file  Physical Activity: Not on file  Stress: Not on file  Social Connections: Not on file  Intimate Partner Violence: Not on file    Review of Systems:    Constitutional: No weight loss, fever or chills Cardiovascular: No chest pain Respiratory: No SOB  Gastrointestinal: See HPI and otherwise negative  Physical Exam:  Vital signs: BP (!) 140/100 (BP Location: Left Arm, Patient Position: Sitting, Cuff Size: Normal)   Pulse (!) 120   Ht 5' 2.5" (1.588 m) Comment: height measured without shoes  Wt 144 lb 8 oz (65.5 kg)   BMI 26.01 kg/m    Constitutional:   Pleasant chronically ill-appearing  Caucasian  female appears to be in NAD, Well developed, Well nourished, alert and cooperative Respiratory: Respirations even and unlabored. Lungs clear to auscultation bilaterally.   No wheezes, crackles, or rhonchi.  Cardiovascular: Normal S1, S2. No MRG. Regular rate and rhythm. No peripheral edema, cyanosis or pallor.  Gastrointestinal:  Soft, nondistended, nontender. No rebound or guarding. Normal bowel sounds. No appreciable masses or hepatomegaly. Psychiatric: Demonstrates good judgement and reason without abnormal affect or behaviors.  RELEVANT LABS AND IMAGING: CBC    Component Value Date/Time   WBC 7.7 11/06/2020 2121   RBC 3.36 (L) 11/06/2020 2121   HGB 12.3 11/06/2020 2121   HGB 12.9 10/25/2017 1230   HCT 35.3 (L) 11/06/2020 2121   PLT 351 11/06/2020 2121   PLT 290 10/25/2017 1230   MCV 105.1 (H) 11/06/2020 2121   MCH 36.6 (H) 11/06/2020 2121   MCHC 34.8 11/06/2020 2121   RDW 14.7 11/06/2020 2121   LYMPHSABS 1.2 11/06/2020 2121   MONOABS 0.5 11/06/2020 2121   EOSABS 0.0 11/06/2020 2121   BASOSABS 0.1 11/06/2020 2121    CMP     Component Value Date/Time   NA 136 11/06/2020 2121   K 3.9 11/06/2020 2121   CL 100 11/06/2020 2121   CO2 27 11/06/2020 2121   GLUCOSE 111 (H) 11/06/2020 2121   BUN 13 11/06/2020 2121   CREATININE 0.76 11/06/2020 2121   CREATININE 0.72 03/19/2019 0931   CALCIUM 8.7 (L) 11/06/2020 2121   PROT 6.4 (L) 11/06/2020 2121   ALBUMIN 3.6 11/06/2020 2121   AST 42 (H) 11/06/2020 2121   AST 17 10/25/2017 1230   ALT 22 11/06/2020 2121   ALT 10 10/25/2017 1230   ALKPHOS 111 11/06/2020 2121   BILITOT 0.3 11/06/2020 2121   BILITOT 0.4 10/25/2017 1230   GFRNONAA >60 11/06/2020 2121   GFRNONAA >60 10/25/2017 1230   GFRAA >60 12/13/2018 0402   GFRAA >60 10/25/2017 1230    Assessment: 1.  Diarrhea: Much improved on Lomotil 1 tab every 2 to 3 days with some formed stools in between; uncertain etiology, infectious work-up negative, consider relation to alcohol  use 2.  Screening for colorectal cancer: Last colonoscopy was 10 years ago, patient does have yearly Hemoccults per her at her PCPs office 3.  Abnormal CT of the liver: Showing cysts and dilated bile ducts, stable from prior; likely related to status postcholecystectomy 4.  Alcohol abuse  Plan: 1.  Discussed with patient at this point I do not feel like she needs a colonoscopy for her diarrhea given that it is under control with as needed Lomotil.  Repeat is technically recommended given that it has been 10 years since her last colonoscopy for screening purposes.  Patient is not eager to undergo evaluation.  Tells me that she does yearly Hemoccult testing and is satisfied with this.  She declines colonoscopy today.  I told her if she changes her mind she can call back and let us know.  She has already been cleared from neurology. 2.  Discussed patient's recent CT of the liver, changes are stable.  Next steps in evaluation would typically be an MRI, but given that  there have been no drastic changes, liver enzymes are fairly normal and there is no pain in the area, I do not feel it is necessary.  Patient wanted me to check with Dr. Silverio Decamp and make sure she does not recommend. 3.  Recommended alcohol cessation 4.  Patient to continue Lomotil 1 tab as needed every 2 to 3 days.  Refilled prescription for her. 5.  Patient to follow in clinic with [place] as needed  Ellouise Newer, PA-C [facility] 11/12/2020, 10:45 AM  Cc: Cipriano Mile, NP

## 2020-11-13 ENCOUNTER — Telehealth: Payer: Self-pay

## 2020-11-13 NOTE — Telephone Encounter (Signed)
Spoke with patient in regards to recommendations. She verbalized understanding and had no concerns at the end of the call.

## 2020-11-13 NOTE — Telephone Encounter (Signed)
Attempted to reach patient twice. Her vm is full and not accepting any new messages at this time.

## 2020-11-13 NOTE — Telephone Encounter (Signed)
-----   Message from Levin Erp, [place] sent at 11/13/2020  1:21 PM EDT ----- Regarding: FW: CT liver Please let patient know that Dr. Silverio Decamp does not feel it necessary for her to have MRI. I had already told her she didn't need it, but she wanted me to see what Dr. Delane Ginger thought.  THnaks-JLL ----- Message ----- From: Mauri Pole, MD Sent: 11/13/2020   1:20 PM EDT To: Levin Erp, PA Subject: RE: CT liver                                   Agree, slightly elevated AST likely secondary to ETOH. We can hold off MRCP given the findings are stable on CT and bile duct dilation is commonly seen s/p cholecystectomy Thanks VN ----- Message ----- From: Levin Erp, PA Sent: 11/12/2020   1:17 PM EDT To: Mauri Pole, MD Subject: CT liver                                       Please see my recent note on this patient, patient had question regarding CT, wanted to know if you would recommend MRI.  I did not feel like it was necessary, but wanted to double check with you.  Thanks, JL L

## 2020-11-16 ENCOUNTER — Telehealth: Payer: Self-pay | Admitting: Orthopaedic Surgery

## 2020-11-16 ENCOUNTER — Other Ambulatory Visit: Payer: Self-pay | Admitting: Orthopaedic Surgery

## 2020-11-16 MED ORDER — HYDROCODONE-ACETAMINOPHEN 5-325 MG PO TABS: 1.0000 | ORAL_TABLET | Freq: Four times a day (QID) | ORAL | 0 refills | Status: DC | PRN

## 2020-11-16 NOTE — Telephone Encounter (Signed)
sent 

## 2020-11-16 NOTE — Telephone Encounter (Signed)
Patient called needing Rx refilled  Hydrocodone.  Patient said she uses [facility] on [address]. The number to contact patient is [phone]

## 2020-11-16 NOTE — Progress Notes (Signed)
Prescription sent in via imprivata

## 2020-11-18 ENCOUNTER — Encounter: Payer: Self-pay | Admitting: Orthopaedic Surgery

## 2020-11-18 ENCOUNTER — Other Ambulatory Visit: Payer: Self-pay

## 2020-11-18 ENCOUNTER — Ambulatory Visit: Payer: Self-pay

## 2020-11-18 ENCOUNTER — Ambulatory Visit (INDEPENDENT_AMBULATORY_CARE_PROVIDER_SITE_OTHER): Payer: Medicare Other | Admitting: Orthopaedic Surgery

## 2020-11-18 DIAGNOSIS — M25552 Pain in left hip: Secondary | ICD-10-CM

## 2020-11-18 DIAGNOSIS — S2239XD Fracture of one rib, unspecified side, subsequent encounter for fracture with routine healing: Secondary | ICD-10-CM

## 2020-11-18 DIAGNOSIS — R0781 Pleurodynia: Secondary | ICD-10-CM

## 2020-11-18 NOTE — Progress Notes (Signed)
Reviewed and agree with documentation and assessment and plan. K. Veena Tamela Elsayed , MD   

## 2020-11-18 NOTE — Progress Notes (Signed)
Office Visit Note   Patient: Mindy Young           Date of Birth: 18-May-1945           MRN: [id number] Visit Date: 11/18/2020              Requested by: Cipriano Mile, NP [place],  [place] [address] PCP: Cipriano Mile, NP   Assessment & Plan: Visit Diagnoses:  1. Rib pain on left side   2. Pain in left hip   3. Closed fracture of one rib with routine healing, unspecified laterality, subsequent encounter     Plan: [name] has been followed for injury she sustained as a result of multiple falls over a month ago.  She notes that she is feeling much better in terms of her right rib cage where she had a single rib fracture.  She is having less pain from the proximal left clavicle fracture and now is able to raise her arm over her head.  Neurologically intact.  She does have chronic shortness of breath related to COPD.  She has had 3 falls since she was seen in the office several weeks ago.  She is having pain over the left rib cage with x-rays demonstrating a possible new rib fracture.  Lung is expanded.  She is also been having pain over her left buttock with ecchymosis but negative films.  I am concerned about her multiple falls and wanted to follow-up with the neurologist.  Apparently in the past she has had EEGs and brain scans.  I would like her to use some ambulatory aid as I am concerned about her falling.  I sent in a new prescription for hydrocodone yesterday and would like to see her back in about 3 weeks.  She definitely is walking better  Follow-Up Instructions: Return in about 3 weeks (around 12/09/2020).   Orders:  Orders Placed This Encounter  Procedures   XR Pelvis 1-2 Views   XR Ribs Unilateral Left   No orders of the defined types were placed in this encounter.     Procedures: No procedures performed   Clinical Data: No additional findings.   Subjective: Chief Complaint  Patient presents with   Left Shoulder - Follow-up    Clavicle  fracture  Patient presents today for a two week follow up on her left clavicle fracture and right rib fracture. She has been taking hydrocodone for pain.  Patient states that there has been no improvement. She fell three times yesterday and is scared that she reinjured her ribs, and now having left sided hip pain.  HPI  Review of Systems   Objective: Vital Signs: There were no vitals taken for this visit.  Physical Exam Constitutional:      Appearance: She is well-developed.  Pulmonary:     Effort: Pulmonary effort is normal.  Skin:    General: Skin is warm and dry.  Neurological:     Mental Status: She is alert and oriented to person, place, and time.  Psychiatric:        Behavior: Behavior normal.    Ortho Exam awake and alert.  Walking around the exam room without ambulatory aid.  She actually looks better.  There were no areas of tenderness about the right rib cage but did have an area of tenderness over the lower left rib cage in the mid axillary line.  There is a possibility of a fracture by plain film.  Her breathing is  unchanged according to her.  She had better range of motion of her left shoulder and minimal tenderness over the proximal left clavicle fracture.  She certainly has prominence of the proximal clavicle fracture which is somewhat displaced but we have elected to leave it alone as I think with all of her comorbidities that would have been a mistake and it does not appear to be limiting her function.  She is fully aware that she will have a prominence in that area but she does not really think it is going to be an issue.  Motor exam intact to both upper extremities.  No pain over the sacrum where she has had a prior fracture  Specialty Comments:  No specialty comments available.  Imaging: XR Pelvis 1-2 Views  Result Date: 11/18/2020 AP the pelvis was obtained without evidence of any acute injury.  Patient has had number of falls recently and had some ecchymosis  about the left buttock but no evidence of a fracture  XR Ribs Unilateral Left  Result Date: 11/18/2020 Left rib detail films were obtained.  The lung is expanded.  There might be a nondisplaced fracture of the 10th or 11th rib peripherally.    PMFS History: Patient Active Problem List   Diagnosis Date Noted   Clavicle fracture 10/29/2020   Rib fracture 10/29/2020   Hypokalemia 06/14/2020   Seizure ([place]) 06/13/2020   Impingement syndrome of left shoulder 03/27/2019   Pain in left shoulder 02/21/2019   Rib pain on right side 02/21/2019   Low back pain 02/07/2019   GERD (gastroesophageal reflux disease) 12/09/2018   Anxiety 12/09/2018   Difficulty speaking 12/09/2018   Leukocytosis 12/09/2018   Seizure-like activity ([place]) 12/09/2018   Pelvic fracture ([place]) 11/25/2018   Pubic bone fracture ([place]) 11/24/2018   Left knee pain 03/29/2018   Unilateral primary osteoarthritis, left knee 03/29/2018   Genetic testing 01/24/2018   Family history of breast cancer    Family history of prostate cancer    Malignant neoplasm of lower-inner quadrant of right breast of female, estrogen receptor positive ([place]) 10/24/2017   Anemia 06/29/2011   Hypertension    Hyponatremia 06/25/2011   Transaminitis 06/25/2011   Alcohol abuse 06/25/2011   Cough 06/25/2011   Cigarette smoker 06/25/2011   Past Medical History:  Diagnosis Date   Alcohol abuse    Allergy    Anemia 06/29/2011   Anxiety    Arthritis    Breast cancer ([place])    right breast   C1 cervical fracture ([place]) 02/1997   Cancer ([place])    melanoma   Depression    Family history of breast cancer    Family history of prostate cancer    GERD (gastroesophageal reflux disease)    Hypertension    Tardive dyskinesia     Family History  Problem Relation Age of Onset   COPD Mother    Prostate cancer Father 42       seed implant for treatment   Colon polyps Father        'a few'   Breast cancer Sister 13   Breast cancer Maternal Aunt         dx >50   Breast cancer Other 35       bilateral   Colon cancer Neg Hx     Past Surgical History:  Procedure Laterality Date   ABDOMINAL HYSTERECTOMY     ASPIRATION OF ABSCESS Right 11/20/2017   Procedure: ASPIRATION OF RIGHT AXILLARY SEROMA;  Surgeon: Rolm Bookbinder,  MD;  Location: [facility];  Service: General;  Laterality: Right;   AUGMENTATION MAMMAPLASTY Bilateral    biateral implants , approx 2015   BREAST LUMPECTOMY Right 11/02/2017   re-ex 11-20-17   BREAST LUMPECTOMY WITH RADIOACTIVE SEED AND SENTINEL LYMPH NODE BIOPSY Right 11/02/2017   Procedure: BREAST LUMPECTOMY WITH RADIOACTIVE SEED AND SENTINEL LYMPH NODE BIOPSY;  Surgeon: Rolm Bookbinder, MD;  Location: [facility];  Service: General;  Laterality: Right;   CHOLECYSTECTOMY     collarbone     INCONTINENCE SURGERY     KNEE SURGERY     removal of cyst, repair of cartiledge   LAPAROSCOPY     for endometriosis   RE-EXCISION OF BREAST LUMPECTOMY Right 11/20/2017   Procedure: RE-EXCISION OF RIGHT BREAST MARGINS;  Surgeon: Rolm Bookbinder, MD;  Location: [facility];  Service: General;  Laterality: Right;   ROTATOR CUFF REPAIR     left   Social History   Occupational History   Not on file  Tobacco Use   Smoking status: Every Day    Packs/day: 1.00    Types: Cigarettes   Smokeless tobacco: Never   Tobacco comments:    e-cigs  Vaping Use   Vaping Use: Former  Substance and Sexual Activity   Alcohol use: Yes    Alcohol/week: 21.0 standard drinks    Types: 21 Standard drinks or equivalent per week    Comment: 2 per weekend day; denies during the week (not consistent with prior hx)   Drug use: No   Sexual activity: Not on file

## 2020-11-19 ENCOUNTER — Ambulatory Visit: Payer: Medicare Other | Admitting: Orthopaedic Surgery

## 2020-12-01 ENCOUNTER — Telehealth: Payer: Self-pay | Admitting: Orthopaedic Surgery

## 2020-12-01 ENCOUNTER — Other Ambulatory Visit: Payer: Self-pay | Admitting: Orthopaedic Surgery

## 2020-12-01 MED ORDER — HYDROCODONE-ACETAMINOPHEN 5-325 MG PO TABS: 1.0000 | ORAL_TABLET | Freq: Four times a day (QID) | ORAL | 0 refills | Status: DC | PRN

## 2020-12-01 NOTE — Telephone Encounter (Signed)
Pt called stating she needs a refill of her hydrocodone rx and also stated she's been having shoulder and collar bone pain. Pt would like a CB when this has been called in and to be advised about her pain.   [phone]

## 2020-12-01 NOTE — Telephone Encounter (Signed)
Will send in hydrocodone-ask her to make an appt in the office if she is having more pain--difficult to assess over the phone

## 2020-12-01 NOTE — Telephone Encounter (Signed)
Called and spoke with patient. She is scheduled to come in Thursday of this week for another evaluation.

## 2020-12-03 ENCOUNTER — Telehealth: Payer: Self-pay | Admitting: Orthopaedic Surgery

## 2020-12-03 ENCOUNTER — Ambulatory Visit: Payer: Medicare Other | Admitting: Orthopaedic Surgery

## 2020-12-03 NOTE — Telephone Encounter (Signed)
Called and spoke with patient. I have scheduled her for next week.

## 2020-12-03 NOTE — Telephone Encounter (Signed)
Yes-as scheduled

## 2020-12-03 NOTE — Telephone Encounter (Signed)
Pt called stating her shoulder has been feeling better and she only has to take her pain rx once a day. Pt had an appt for today but got sick and had to cancel; she would like a CB to see if Dr.Whitfield wants to see her back in the office?  [phone]

## 2020-12-10 ENCOUNTER — Ambulatory Visit: Payer: Medicare Other | Admitting: Orthopaedic Surgery

## 2020-12-16 ENCOUNTER — Ambulatory Visit: Payer: Medicare Other | Admitting: Orthopaedic Surgery

## 2020-12-19 ENCOUNTER — Emergency Department (HOSPITAL_BASED_OUTPATIENT_CLINIC_OR_DEPARTMENT_OTHER): Payer: Medicare Other

## 2020-12-19 ENCOUNTER — Encounter (HOSPITAL_BASED_OUTPATIENT_CLINIC_OR_DEPARTMENT_OTHER): Payer: Self-pay | Admitting: Emergency Medicine

## 2020-12-19 ENCOUNTER — Emergency Department (HOSPITAL_BASED_OUTPATIENT_CLINIC_OR_DEPARTMENT_OTHER)
Admission: EM | Admit: 2020-12-19 | Discharge: 2020-12-19 | Disposition: A | Discharge status: Home / Self Care | Payer: Medicare Other | Attending: Emergency Medicine | Admitting: Emergency Medicine

## 2020-12-19 DIAGNOSIS — W19XXXA Unspecified fall, initial encounter: Secondary | ICD-10-CM | POA: Diagnosis not present

## 2020-12-19 DIAGNOSIS — F17210 Nicotine dependence, cigarettes, uncomplicated: Secondary | ICD-10-CM | POA: Insufficient documentation

## 2020-12-19 DIAGNOSIS — Y92002 Bathroom of unspecified non-institutional (private) residence single-family (private) house as the place of occurrence of the external cause: Secondary | ICD-10-CM | POA: Diagnosis not present

## 2020-12-19 DIAGNOSIS — I10 Essential (primary) hypertension: Secondary | ICD-10-CM | POA: Diagnosis not present

## 2020-12-19 DIAGNOSIS — Z79899 Other long term (current) drug therapy: Secondary | ICD-10-CM | POA: Insufficient documentation

## 2020-12-19 DIAGNOSIS — R4182 Altered mental status, unspecified: Secondary | ICD-10-CM | POA: Insufficient documentation

## 2020-12-19 DIAGNOSIS — Y908 Blood alcohol level of 240 mg/100 ml or more: Secondary | ICD-10-CM | POA: Insufficient documentation

## 2020-12-19 DIAGNOSIS — Z85820 Personal history of malignant melanoma of skin: Secondary | ICD-10-CM | POA: Insufficient documentation

## 2020-12-19 DIAGNOSIS — F10120 Alcohol abuse with intoxication, uncomplicated: Secondary | ICD-10-CM | POA: Insufficient documentation

## 2020-12-19 DIAGNOSIS — S5012XA Contusion of left forearm, initial encounter: Secondary | ICD-10-CM | POA: Diagnosis not present

## 2020-12-19 DIAGNOSIS — S59902A Unspecified injury of left elbow, initial encounter: Secondary | ICD-10-CM | POA: Diagnosis present

## 2020-12-19 DIAGNOSIS — S51012A Laceration without foreign body of left elbow, initial encounter: Secondary | ICD-10-CM | POA: Insufficient documentation

## 2020-12-19 DIAGNOSIS — Z853 Personal history of malignant neoplasm of breast: Secondary | ICD-10-CM | POA: Diagnosis not present

## 2020-12-19 DIAGNOSIS — F10920 Alcohol use, unspecified with intoxication, uncomplicated: Secondary | ICD-10-CM

## 2020-12-19 LAB — COMPREHENSIVE METABOLIC PANEL
ALT: 55 U/L — ABNORMAL HIGH (ref 0–44)
AST: 114 U/L — ABNORMAL HIGH (ref 15–41)
Albumin: 3.6 g/dL (ref 3.5–5.0)
Alkaline Phosphatase: 88 U/L (ref 38–126)
Anion gap: 16 — ABNORMAL HIGH (ref 5–15)
BUN: 12 mg/dL (ref 8–23)
CO2: 19 mmol/L — ABNORMAL LOW (ref 22–32)
Calcium: 8.5 mg/dL — ABNORMAL LOW (ref 8.9–10.3)
Chloride: 100 mmol/L (ref 98–111)
Creatinine, Ser: 0.56 mg/dL (ref 0.44–1.00)
GFR, Estimated: >60 mL/min (ref >60)
Glucose, Bld: 81 mg/dL (ref 70–99)
Potassium: 4.1 mmol/L (ref 3.5–5.1)
Sodium: 135 mmol/L (ref 135–145)
Total Bilirubin: 0.6 mg/dL (ref 0.3–1.2)
Total Protein: 6.7 g/dL (ref 6.5–8.1)

## 2020-12-19 LAB — CBC
HCT: 40.5 % (ref 36.0–46.0)
Hemoglobin: 14.1 g/dL (ref 12.0–15.0)
MCH: 36.4 pg — ABNORMAL HIGH (ref 26.0–34.0)
MCHC: 34.8 g/dL (ref 30.0–36.0)
MCV: 104.7 fL — ABNORMAL HIGH (ref 80.0–100.0)
Platelets: 267 10*3/uL (ref 150–400)
RBC: 3.87 MIL/uL (ref 3.87–5.11)
RDW: 13.2 % (ref 11.5–15.5)
WBC: 7.1 10*3/uL (ref 4.0–10.5)
nRBC: 0 % (ref 0.0–0.2)

## 2020-12-19 LAB — ETHANOL: Alcohol, Ethyl (B): 290 mg/dL — ABNORMAL HIGH (ref <10)

## 2020-12-19 LAB — PROTIME-INR
INR: 0.9 (ref 0.8–1.2)
Prothrombin Time: 11.9 seconds (ref 11.4–15.2)

## 2020-12-19 LAB — MAGNESIUM: Magnesium: 1.4 mg/dL — ABNORMAL LOW (ref 1.7–2.4)

## 2020-12-19 MED ORDER — TETANUS-DIPHTH-ACELL PERTUSSIS 5-2.5-18.5 LF-MCG/0.5 IM SUSY: 0.5000 mL | PREFILLED_SYRINGE | Freq: Once | INTRAMUSCULAR | Status: DC

## 2020-12-19 MED ORDER — THIAMINE HCL 100 MG/ML IJ SOLN
100.0000 mg | Freq: Once | INTRAMUSCULAR | Status: AC
  Administered 2020-12-19: 100 mg via INTRAVENOUS
  Filled 2020-12-19: qty 2

## 2020-12-19 MED ORDER — LACTATED RINGERS IV BOLUS
1000.0000 mL | Freq: Once | INTRAVENOUS | Status: AC
  Administered 2020-12-19: 1000 mL via INTRAVENOUS

## 2020-12-19 MED ORDER — MAGNESIUM SULFATE 2 GM/50ML IV SOLN
2.0000 g | Freq: Once | INTRAVENOUS | Status: AC
  Administered 2020-12-19: 2 g via INTRAVENOUS
  Filled 2020-12-19: qty 50

## 2020-12-19 NOTE — ED Notes (Signed)
Pt ambulatory with standby assist.

## 2020-12-19 NOTE — ED Notes (Signed)
Pt transported to CT ?

## 2020-12-19 NOTE — ED Notes (Signed)
Attempted to obtain labs x2. 1 cc obtained and placed in light green tube. Unable to obtain further blood. RN aware.

## 2020-12-19 NOTE — ED Provider Notes (Signed)
[facility] EMERGENCY DEPARTMENT Provider Note   CSN: [id number] Arrival date & time: 12/19/20  1159     History Chief Complaint  Patient presents with   Mindy Young is a 75 y.o. female.  HPI 75 year old female with history of alcohol abuse, presenting for a fall at home.  This is in the setting of alcohol intoxication.  She was at home with her caregiver.  Fall was unwitnessed.  It appears to have occurred while patient was using the bathroom.  When she fell, she was too intoxicated to stand up.  She was blocking the door and her caregiver was unable to enter the bathroom.  Because of this, she called EMS.  EMS arrived and noted a skin tear to her left elbow and acute intoxication.  She was resistant to transport.  On arrival, she denies any areas of discomfort.  She has complaints that the EMS personnel are "mean".  Due to her intoxication, patient unable to provide further history.  History is provided by her caregiver.    Past Medical History:  Diagnosis Date   Alcohol abuse    Allergy    Anemia 06/29/2011   Anxiety    Arthritis    Breast cancer ([place])    right breast   C1 cervical fracture ([place]) 02/1997   Cancer ([place])    melanoma   Depression    Family history of breast cancer    Family history of prostate cancer    GERD (gastroesophageal reflux disease)    Hypertension    Tardive dyskinesia     Patient Active Problem List   Diagnosis Date Noted   Clavicle fracture 10/29/2020   Rib fracture 10/29/2020   Hypokalemia 06/14/2020   Seizure ([place]) 06/13/2020   Impingement syndrome of left shoulder 03/27/2019   Pain in left shoulder 02/21/2019   Rib pain on right side 02/21/2019   Low back pain 02/07/2019   GERD (gastroesophageal reflux disease) 12/09/2018   Anxiety 12/09/2018   Difficulty speaking 12/09/2018   Leukocytosis 12/09/2018   Seizure-like activity ([place]) 12/09/2018   Pelvic fracture ([place]) 11/25/2018   Pubic bone fracture ([place])  11/24/2018   Left knee pain 03/29/2018   Unilateral primary osteoarthritis, left knee 03/29/2018   Genetic testing 01/24/2018   Family history of breast cancer    Family history of prostate cancer    Malignant neoplasm of lower-inner quadrant of right breast of female, estrogen receptor positive ([place]) 10/24/2017   Anemia 06/29/2011   Hypertension    Hyponatremia 06/25/2011   Transaminitis 06/25/2011   Alcohol abuse 06/25/2011   Cough 06/25/2011   Cigarette smoker 06/25/2011    Past Surgical History:  Procedure Laterality Date   ABDOMINAL HYSTERECTOMY     ASPIRATION OF ABSCESS Right 11/20/2017   Procedure: ASPIRATION OF RIGHT AXILLARY SEROMA;  Surgeon: Rolm Bookbinder, MD;  Location: [facility];  Service: General;  Laterality: Right;   AUGMENTATION MAMMAPLASTY Bilateral    biateral implants , approx 2015   BREAST LUMPECTOMY Right 11/02/2017   re-ex 11-20-17   BREAST LUMPECTOMY WITH RADIOACTIVE SEED AND SENTINEL LYMPH NODE BIOPSY Right 11/02/2017   Procedure: BREAST LUMPECTOMY WITH RADIOACTIVE SEED AND SENTINEL LYMPH NODE BIOPSY;  Surgeon: Rolm Bookbinder, MD;  Location: [facility];  Service: General;  Laterality: Right;   CHOLECYSTECTOMY     collarbone     INCONTINENCE SURGERY     KNEE SURGERY     removal of cyst, repair of cartiledge  LAPAROSCOPY     for endometriosis   RE-EXCISION OF BREAST LUMPECTOMY Right 11/20/2017   Procedure: RE-EXCISION OF RIGHT BREAST MARGINS;  Surgeon: Rolm Bookbinder, MD;  Location: [facility];  Service: General;  Laterality: Right;   ROTATOR CUFF REPAIR     left     OB History   No obstetric history on file.     Family History  Problem Relation Age of Onset   COPD Mother    Prostate cancer Father 40       seed implant for treatment   Colon polyps Father        'a few'   Breast cancer Sister 78   Breast cancer Maternal Aunt        dx >50   Breast cancer Other 50       bilateral    Colon cancer Neg Hx     Social History   Tobacco Use   Smoking status: Every Day    Packs/day: 1.00    Types: Cigarettes   Smokeless tobacco: Never   Tobacco comments:    e-cigs  Vaping Use   Vaping Use: Former  Substance Use Topics   Alcohol use: Yes    Alcohol/week: 21.0 standard drinks    Types: 21 Standard drinks or equivalent per week    Comment: 2 per weekend day; denies during the week (not consistent with prior hx)   Drug use: No    Home Medications Prior to Admission medications   Medication Sig Start Date End Date Taking? Authorizing Provider  albuterol (VENTOLIN HFA) 108 (90 Base) MCG/ACT inhaler Inhale 1-2 puffs into the lungs as needed. 10/28/20   [provider]  ALPRAZolam Duanne Moron) 0.5 MG tablet Take 0.5 mg by mouth 3 (three) times daily as needed. 11/02/20   [provider]  cetirizine (ZYRTEC) 10 MG tablet Take 10 mg by mouth as needed for allergies.    [provider]  chlordiazePOXIDE (LIBRIUM) 25 MG capsule 50mg  PO TID x 1D, then 25-50mg  PO BID X 1D, then 25-50mg  PO QD X 1D 08/31/20   Lacretia Leigh, MD  Cholecalciferol (VITAMIN D3) 50 MCG (2000 UT) capsule Take 2,000 Units by mouth every morning. 04/03/20   [provider]  diphenhydrAMINE (BENADRYL) 25 MG tablet Take 25 mg by mouth at bedtime as needed for sleep.    [provider]  diphenoxylate-atropine (LOMOTIL) 2.5-0.025 MG tablet Take 1 tablet by mouth 4 (four) times daily as needed for diarrhea or loose stools. 11/12/20   Levin Erp, PA  HYDROcodone-acetaminophen (NORCO/VICODIN) 5-325 MG tablet Take 1 tablet by mouth every 6 (six) hours as needed for moderate pain. 10/29/20   Garald Balding, MD  HYDROcodone-acetaminophen (NORCO/VICODIN) 5-325 MG tablet Take 1 tablet by mouth every 6 (six) hours as needed for moderate pain. 10/29/20   Garald Balding, MD  HYDROcodone-acetaminophen (NORCO/VICODIN) 5-325 MG tablet Take 1 tablet by mouth every 6 (six) hours  as needed for moderate pain. 11/05/20   Garald Balding, MD  HYDROcodone-acetaminophen (NORCO/VICODIN) 5-325 MG tablet Take 1 tablet by mouth every 6 (six) hours as needed for moderate pain. 11/16/20   Garald Balding, MD  HYDROcodone-acetaminophen (NORCO/VICODIN) 5-325 MG tablet Take 1 tablet by mouth every 6 (six) hours as needed for moderate pain. 12/01/20   Garald Balding, MD  levETIRAcetam (KEPPRA) 1000 MG tablet Take 1 tablet (1,000 mg total) by mouth 2 (two) times daily. 06/15/20   Aline August, MD  lisinopril (ZESTRIL)  5 MG tablet Take 5 mg by mouth daily. 09/04/20   [provider]  MAGNESIUM-OXIDE 400 (240 Mg) MG tablet Take 1 tablet by mouth daily. 07/17/20   [provider]  mirtazapine (REMERON) 15 MG tablet Take 15 mg by mouth at bedtime. 07/15/20   [provider]  Multiple Vitamin (MULTI VITAMIN DAILY) TABS Take 1 tablet by mouth every morning. 04/03/20   [provider]  oxybutynin (DITROPAN-XL) 10 MG 24 hr tablet Take 10 mg by mouth daily.  12/05/16   [provider]  potassium chloride SA (KLOR-CON) 20 MEQ tablet Take 1 tablet (20 mEq total) by mouth 2 (two) times daily for 7 days. 07/06/20 11/12/20  Arnaldo Natal, MD  RA MELATONIN 10 MG TABS Take 10-20 mg by mouth at bedtime as needed for sleep. 03/29/20   [provider]  traMADol (ULTRAM) 50 MG tablet Take 1 tablet (50 mg total) by mouth every 12 (twelve) hours as needed for up to 6 doses. 10/28/20   Curatolo, Adam, DO  TRELEGY ELLIPTA 100-62.5-25 MCG/INH AEPB Inhale 1 puff into the lungs every morning. 04/03/20   [provider]  TRINTELLIX 5 MG TABS tablet Take 5 mg by mouth daily. 05/13/20   [provider]  Grant Ruts INHUB 250-50 MCG/ACT AEPB Inhale 1 puff into the lungs every 12 (twelve) hours. 10/28/20   [provider]  ZOFRAN 4 MG tablet Take 4 mg by mouth as needed for nausea/vomiting. 04/03/20   [provider]    Allergies    Percocet  [oxycodone-acetaminophen]  Review of Systems   Review of Systems  Unable to perform ROS: Other (Alcohol intoxication)   Physical Exam Updated Vital Signs BP (!) 144/101 (BP Location: Right Arm)   Pulse 94   Temp 98.5 F (36.9 C) (Oral)   Resp 20   SpO2 95%   Physical Exam Vitals and nursing note reviewed.  Constitutional:      General: She is not in acute distress.    Appearance: Normal appearance. She is well-developed and normal weight. She is not ill-appearing, toxic-appearing or diaphoretic.  HENT:     Head: Normocephalic and atraumatic.     Right Ear: External ear normal.     Left Ear: External ear normal.     Nose: Nose normal.     Mouth/Throat:     Mouth: Mucous membranes are moist.     Pharynx: Oropharynx is clear.  Eyes:     General: No scleral icterus.    Extraocular Movements: Extraocular movements intact.     Conjunctiva/sclera: Conjunctivae normal.     Pupils: Pupils are equal, round, and reactive to light.  Cardiovascular:     Rate and Rhythm: Normal rate and regular rhythm.     Heart sounds: No murmur heard. Pulmonary:     Effort: Pulmonary effort is normal. No respiratory distress.     Breath sounds: Normal breath sounds. No wheezing or rales.  Chest:     Chest wall: No tenderness.  Abdominal:     Palpations: Abdomen is soft.     Tenderness: There is no abdominal tenderness. There is no right CVA tenderness or left CVA tenderness.  Musculoskeletal:        General: Signs of injury (Skin tear to left elbow) present. No swelling, tenderness or deformity.     Cervical back: Normal range of motion and neck supple. No tenderness.     Right lower leg: No edema.     Left lower leg:  No edema.  Skin:    General: Skin is warm and dry.     Capillary Refill: Capillary refill takes less than 2 seconds.     Coloration: Skin is not jaundiced or pale.     Findings: Bruising (Old bruising to left forearm) present.  Neurological:     Mental Status: She is alert.      Cranial Nerves: No cranial nerve deficit.     Sensory: No sensory deficit.     Motor: No weakness.  Psychiatric:        Mood and Affect: Affect is labile.        Speech: Speech is slurred.    ED Results / Procedures / Treatments   Labs (all labs ordered are listed, but only abnormal results are displayed) Labs Reviewed  COMPREHENSIVE METABOLIC PANEL - Abnormal; Notable for the following components:      Result Value   CO2 19 (*)    Calcium 8.5 (*)    AST 114 (*)    ALT 55 (*)    Anion gap 16 (*)    All other components within normal limits  CBC - Abnormal; Notable for the following components:   MCV 104.7 (*)    MCH 36.4 (*)    All other components within normal limits  ETHANOL - Abnormal; Notable for the following components:   Alcohol, Ethyl (B) 290 (*)    All other components within normal limits  MAGNESIUM - Abnormal; Notable for the following components:   Magnesium 1.4 (*)    All other components within normal limits  PROTIME-INR    EKG EKG Interpretation  Date/Time:  Saturday December 19 2020 12:17:01 EDT Ventricular Rate:  103 PR Interval:  155 QRS Duration: 93 QT Interval:  357 QTC Calculation: 468 R Axis:   93 Text Interpretation: Sinus tachycardia Right axis deviation Abnormal R-wave progression, late transition Confirmed by Godfrey Pick [phone]) on 12/19/2020 2:29:22 PM  Radiology DG Elbow Complete Left  Result Date: 12/19/2020 CLINICAL DATA:  fall EXAM: LEFT ELBOW - COMPLETE 3+ VIEW COMPARISON:  None. FINDINGS: Normal alignment. No acute fracture. Normal mineralization. An IV is in place with overlying bandaging material. No joint effusion. IMPRESSION: No malalignment or acute fracture. Electronically Signed   By: Albin Felling M.D.   On: 12/19/2020 13:55   CT HEAD WO CONTRAST  Result Date: 12/19/2020 CLINICAL DATA:  Altered mental status, neck injury EXAM: CT HEAD WITHOUT CONTRAST CT CERVICAL SPINE WITHOUT CONTRAST TECHNIQUE: Multidetector CT imaging  of the head and cervical spine was performed following the standard protocol without intravenous contrast. Multiplanar CT image reconstructions of the cervical spine were also generated. COMPARISON:  November 06, 2020. FINDINGS: CT HEAD FINDINGS Brain: Mild diffuse cortical atrophy is noted. Mild chronic ischemic white matter disease is noted. No mass effect or midline shift is noted. Ventricular size is within normal limits. There is no evidence of mass lesion, hemorrhage or acute infarction. Vascular: No hyperdense vessel or unexpected calcification. Skull: Normal. Negative for fracture or focal lesion. Sinuses/Orbits: No acute finding. Other: None. CT CERVICAL SPINE FINDINGS Alignment: Mild grade 1 anterolisthesis of C4-5 is noted secondary to posterior facet joint hypertrophy. Minimal grade 1 anterolisthesis of C7-T1 is noted secondary to posterior facet joint hypertrophy. Skull base and vertebrae: No acute fracture. No primary bone lesion or focal pathologic process. Soft tissues and spinal canal: No prevertebral fluid or swelling. No visible canal hematoma. Disc levels: Severe degenerative disc disease is noted at C4-5, C5-6 and C6-7.  Moderate degenerative disc disease is noted at C7-T1. Upper chest: No acute abnormality is seen. Other: Degenerative changes are seen involving the posterior facet joints bilaterally. IMPRESSION: No acute intracranial abnormality seen. Moderate to severe multilevel degenerative disc disease. No acute abnormality seen in the cervical spine. Electronically Signed   By: Marijo Conception M.D.   On: 12/19/2020 14:00   CT CERVICAL SPINE WO CONTRAST  Result Date: 12/19/2020 CLINICAL DATA:  Altered mental status, neck injury EXAM: CT HEAD WITHOUT CONTRAST CT CERVICAL SPINE WITHOUT CONTRAST TECHNIQUE: Multidetector CT imaging of the head and cervical spine was performed following the standard protocol without intravenous contrast. Multiplanar CT image reconstructions of the cervical  spine were also generated. COMPARISON:  November 06, 2020. FINDINGS: CT HEAD FINDINGS Brain: Mild diffuse cortical atrophy is noted. Mild chronic ischemic white matter disease is noted. No mass effect or midline shift is noted. Ventricular size is within normal limits. There is no evidence of mass lesion, hemorrhage or acute infarction. Vascular: No hyperdense vessel or unexpected calcification. Skull: Normal. Negative for fracture or focal lesion. Sinuses/Orbits: No acute finding. Other: None. CT CERVICAL SPINE FINDINGS Alignment: Mild grade 1 anterolisthesis of C4-5 is noted secondary to posterior facet joint hypertrophy. Minimal grade 1 anterolisthesis of C7-T1 is noted secondary to posterior facet joint hypertrophy. Skull base and vertebrae: No acute fracture. No primary bone lesion or focal pathologic process. Soft tissues and spinal canal: No prevertebral fluid or swelling. No visible canal hematoma. Disc levels: Severe degenerative disc disease is noted at C4-5, C5-6 and C6-7. Moderate degenerative disc disease is noted at C7-T1. Upper chest: No acute abnormality is seen. Other: Degenerative changes are seen involving the posterior facet joints bilaterally. IMPRESSION: No acute intracranial abnormality seen. Moderate to severe multilevel degenerative disc disease. No acute abnormality seen in the cervical spine. Electronically Signed   By: Marijo Conception M.D.   On: 12/19/2020 14:00   DG Pelvis Portable  Result Date: 12/19/2020 CLINICAL DATA:  Altered mental status EXAM: PORTABLE PELVIS 1-2 VIEWS COMPARISON:  None. FINDINGS: Normal alignment of the femoroacetabular joints. Mild degenerative changes of the hips. Chronic fracture deformities of the right superior inferior pubic rami. No acute displaced fracture appreciated. The soft tissues are unremarkable. IMPRESSION: No malalignment or acute fracture. Chronic fracture deformities of the right superior and inferior pubic rami. Electronically Signed    By: Albin Felling M.D.   On: 12/19/2020 13:50   DG Chest Port 1 View  Result Date: 12/19/2020 CLINICAL DATA:  Intoxication EXAM: PORTABLE CHEST 1 VIEW COMPARISON:  None. FINDINGS: The cardiomediastinal silhouette is within normal limits. No pleural effusion. No pneumothorax. No mass or consolidation. Chronic changes of the left shoulder including partial resection of the left clavicle. No acute osseous abnormality. Calcifications of the aortic arch. IMPRESSION: No acute abnormality of the chest.  No consolidative pneumonia. Electronically Signed   By: Albin Felling M.D.   On: 12/19/2020 13:53    Procedures Procedures   Medications Ordered in ED Medications  magnesium sulfate IVPB 2 g 50 mL (0 g Intravenous Stopped 12/19/20 1453)  lactated ringers bolus 1,000 mL (0 mLs Intravenous Stopped 12/19/20 1525)  thiamine (B-1) injection 100 mg (100 mg Intravenous Given 12/19/20 1453)    ED Course  I have reviewed the triage vital signs and the nursing notes.  Pertinent labs & imaging results that were available during my care of the patient were reviewed by me and considered in my medical decision making (see chart for  details).    MDM Rules/Calculators/A&P                          75 year old female with history of alcohol abuse presenting for a fall in setting of alcohol intoxication.  On arrival, she is awake and able to answer basic questions.  She does have slurred speech and labile affect, consistent with acute alcohol intoxication.  Lab work and imaging studies were ordered.  CT scan of head and neck were negative for acute injuries.  X-ray imaging of chest, pelvis, and left elbow was negative for bony involvement as well.  Skin tear was dressed with Xeroform and gauze.  Lab work showed hypomagnesemia which was replaced in the ED.  Patient's ethanol level was 290, significantly high for a patient of this age.  Thiamine and IV fluids were given.  Given that her alcoholism is a chronic  condition, shared decision making discussion was had with her caregiver.  Patient's caregiver does state that she feels comfortable taking the patient home to metabolize alcohol.  Patient and caregiver were encouraged to return to the ED for any further concerns.  Patient was discharged in stable condition.  Final Clinical Impression(s) / ED Diagnoses Final diagnoses:  Fall  Alcoholic intoxication without complication [facility])    Rx / DC Orders ED Discharge Orders     None        Godfrey Pick, MD 12/20/20 [phone]

## 2020-12-19 NOTE — ED Notes (Signed)
Pt NAD, a/ox4. Pt caregiver verbalizes understanding of all DC and f/u instructions. All questions answered. Pt wheeled in w/c by tech and assisted into vehicle at DC.

## 2020-12-19 NOTE — ED Notes (Signed)
Unable to obtain labs with IV stick

## 2020-12-19 NOTE — ED Notes (Signed)
Attempted IV 2x.

## 2020-12-19 NOTE — ED Notes (Signed)
Verbal order from Dr. Doren Custard to obtain labwork from lower extremity if possible.

## 2020-12-26 ENCOUNTER — Emergency Department (HOSPITAL_COMMUNITY)
Admission: EM | Admit: 2020-12-26 | Discharge: 2020-12-27 | Disposition: A | Discharge status: Home / Self Care | Payer: Medicare Other | Attending: Emergency Medicine | Admitting: Emergency Medicine

## 2020-12-26 ENCOUNTER — Emergency Department (HOSPITAL_COMMUNITY): Payer: Medicare Other

## 2020-12-26 ENCOUNTER — Encounter (HOSPITAL_COMMUNITY): Payer: Self-pay

## 2020-12-26 DIAGNOSIS — I10 Essential (primary) hypertension: Secondary | ICD-10-CM | POA: Diagnosis not present

## 2020-12-26 DIAGNOSIS — Y908 Blood alcohol level of 240 mg/100 ml or more: Secondary | ICD-10-CM | POA: Diagnosis not present

## 2020-12-26 DIAGNOSIS — Z79899 Other long term (current) drug therapy: Secondary | ICD-10-CM | POA: Insufficient documentation

## 2020-12-26 DIAGNOSIS — F17210 Nicotine dependence, cigarettes, uncomplicated: Secondary | ICD-10-CM | POA: Diagnosis not present

## 2020-12-26 DIAGNOSIS — Z046 Encounter for general psychiatric examination, requested by authority: Secondary | ICD-10-CM | POA: Diagnosis not present

## 2020-12-26 DIAGNOSIS — F1020 Alcohol dependence, uncomplicated: Secondary | ICD-10-CM | POA: Insufficient documentation

## 2020-12-26 DIAGNOSIS — Z85820 Personal history of malignant melanoma of skin: Secondary | ICD-10-CM | POA: Diagnosis not present

## 2020-12-26 DIAGNOSIS — G2401 Drug induced subacute dyskinesia: Secondary | ICD-10-CM | POA: Diagnosis not present

## 2020-12-26 DIAGNOSIS — Z853 Personal history of malignant neoplasm of breast: Secondary | ICD-10-CM | POA: Insufficient documentation

## 2020-12-26 DIAGNOSIS — F10920 Alcohol use, unspecified with intoxication, uncomplicated: Secondary | ICD-10-CM

## 2020-12-26 DIAGNOSIS — W1839XA Other fall on same level, initial encounter: Secondary | ICD-10-CM | POA: Insufficient documentation

## 2020-12-26 DIAGNOSIS — F10220 Alcohol dependence with intoxication, uncomplicated: Secondary | ICD-10-CM | POA: Diagnosis not present

## 2020-12-26 LAB — CBC WITH DIFFERENTIAL/PLATELET
Abs Immature Granulocytes: 0.04 10*3/uL (ref 0.00–0.07)
Basophils Absolute: 0 10*3/uL (ref 0.0–0.1)
Basophils Relative: 0 %
Eosinophils Absolute: 0 10*3/uL (ref 0.0–0.5)
Eosinophils Relative: 0 %
HCT: 36.6 % (ref 36.0–46.0)
Hemoglobin: 12.9 g/dL (ref 12.0–15.0)
Immature Granulocytes: 1 %
Lymphocytes Relative: 21 %
Lymphs Abs: 1.5 10*3/uL (ref 0.7–4.0)
MCH: 36.6 pg — ABNORMAL HIGH (ref 26.0–34.0)
MCHC: 35.2 g/dL (ref 30.0–36.0)
MCV: 104 fL — ABNORMAL HIGH (ref 80.0–100.0)
Monocytes Absolute: 0.7 10*3/uL (ref 0.1–1.0)
Monocytes Relative: 10 %
Neutro Abs: 5 10*3/uL (ref 1.7–7.7)
Neutrophils Relative %: 68 %
Platelets: 283 10*3/uL (ref 150–400)
RBC: 3.52 MIL/uL — ABNORMAL LOW (ref 3.87–5.11)
RDW: 13.1 % (ref 11.5–15.5)
WBC: 7.3 10*3/uL (ref 4.0–10.5)
nRBC: 0 % (ref 0.0–0.2)

## 2020-12-26 LAB — COMPREHENSIVE METABOLIC PANEL
ALT: 64 U/L — ABNORMAL HIGH (ref 0–44)
AST: 122 U/L — ABNORMAL HIGH (ref 15–41)
Albumin: 3.6 g/dL (ref 3.5–5.0)
Alkaline Phosphatase: 82 U/L (ref 38–126)
Anion gap: 19 — ABNORMAL HIGH (ref 5–15)
BUN: 15 mg/dL (ref 8–23)
CO2: 18 mmol/L — ABNORMAL LOW (ref 22–32)
Calcium: 8.2 mg/dL — ABNORMAL LOW (ref 8.9–10.3)
Chloride: 98 mmol/L (ref 98–111)
Creatinine, Ser: 0.61 mg/dL (ref 0.44–1.00)
GFR, Estimated: >60 mL/min (ref >60)
Glucose, Bld: 57 mg/dL — ABNORMAL LOW (ref 70–99)
Potassium: 3.6 mmol/L (ref 3.5–5.1)
Sodium: 135 mmol/L (ref 135–145)
Total Bilirubin: 0.8 mg/dL (ref 0.3–1.2)
Total Protein: 6.3 g/dL — ABNORMAL LOW (ref 6.5–8.1)

## 2020-12-26 LAB — ETHANOL: Alcohol, Ethyl (B): 361 mg/dL (ref <10)

## 2020-12-26 LAB — ACETAMINOPHEN LEVEL: Acetaminophen (Tylenol), Serum: <10 ug/mL — ABNORMAL LOW (ref 10–30)

## 2020-12-26 LAB — SALICYLATE LEVEL: Salicylate Lvl: <7 mg/dL — ABNORMAL LOW (ref 7.0–30.0)

## 2020-12-26 MED ORDER — THIAMINE HCL 100 MG PO TABS
100.0000 mg | ORAL_TABLET | Freq: Every day | ORAL | Status: DC
  Administered 2020-12-27: 100 mg via ORAL
  Filled 2020-12-26: qty 1

## 2020-12-26 MED ORDER — LORAZEPAM 2 MG/ML IJ SOLN: 0.0000 mg | Freq: Four times a day (QID) | INTRAMUSCULAR | Status: DC

## 2020-12-26 MED ORDER — PANTOPRAZOLE SODIUM 40 MG PO TBEC
40.0000 mg | DELAYED_RELEASE_TABLET | Freq: Every morning | ORAL | Status: DC
  Administered 2020-12-27: 40 mg via ORAL
  Filled 2020-12-26: qty 1

## 2020-12-26 MED ORDER — THIAMINE HCL 100 MG/ML IJ SOLN: 100.0000 mg | Freq: Every day | INTRAMUSCULAR | Status: DC

## 2020-12-26 MED ORDER — LISINOPRIL 10 MG PO TABS
5.0000 mg | ORAL_TABLET | Freq: Every morning | ORAL | Status: DC
  Administered 2020-12-27: 5 mg via ORAL
  Filled 2020-12-26: qty 1

## 2020-12-26 MED ORDER — VORTIOXETINE HBR 5 MG PO TABS
5.0000 mg | ORAL_TABLET | Freq: Every morning | ORAL | Status: DC
  Administered 2020-12-27: 5 mg via ORAL
  Filled 2020-12-26: qty 1

## 2020-12-26 MED ORDER — VITAMIN B-12 1000 MCG PO TABS
3000.0000 ug | ORAL_TABLET | Freq: Every morning | ORAL | Status: DC
  Administered 2020-12-27: 3000 ug via ORAL
  Filled 2020-12-26: qty 3

## 2020-12-26 MED ORDER — LORAZEPAM 1 MG PO TABS: 0.0000 mg | ORAL_TABLET | Freq: Two times a day (BID) | ORAL | Status: DC

## 2020-12-26 MED ORDER — LORAZEPAM 2 MG/ML IJ SOLN: 0.0000 mg | Freq: Two times a day (BID) | INTRAMUSCULAR | Status: DC

## 2020-12-26 MED ORDER — LEVETIRACETAM 500 MG PO TABS
1000.0000 mg | ORAL_TABLET | Freq: Two times a day (BID) | ORAL | Status: DC
  Administered 2020-12-27: 1000 mg via ORAL
  Filled 2020-12-26: qty 2

## 2020-12-26 MED ORDER — OXYBUTYNIN CHLORIDE ER 10 MG PO TB24
10.0000 mg | ORAL_TABLET | Freq: Every morning | ORAL | Status: DC
  Administered 2020-12-27: 10 mg via ORAL
  Filled 2020-12-26: qty 1

## 2020-12-26 MED ORDER — LORAZEPAM 1 MG PO TABS
0.0000 mg | ORAL_TABLET | Freq: Four times a day (QID) | ORAL | Status: DC
  Administered 2020-12-27: 1 mg via ORAL
  Filled 2020-12-26: qty 1

## 2020-12-26 NOTE — ED Provider Notes (Signed)
[facility] DEPT Provider Note   CSN: [id number] Arrival date & time: 12/26/20  2002     History Chief Complaint  Patient presents with   IVC   Alcohol Intoxication    Mindy Young is a 75 y.o. female.  75 year old female with medical history as detailed below presents for evaluation.  Patient arrives with IVC initiated by her caregiver.  Patient with reported excessive and frequent alcohol use and abuse.  Patient with frequent falls that occur on a near daily basis are related to her alcohol intoxication.  Patient arrives on IVC.  Patient reports that she has had several drinks today.  She is clinically intoxicated on exam.    Patient reports another fall earlier today.  She denies any specific injury related to same.  The history is provided by the patient, medical records and the EMS personnel.  Alcohol Intoxication This is a chronic problem. The problem occurs constantly. The problem has not changed since onset.Nothing aggravates the symptoms. Nothing relieves the symptoms.      Past Medical History:  Diagnosis Date   Alcohol abuse    Allergy    Anemia 06/29/2011   Anxiety    Arthritis    Breast cancer ([place])    right breast   C1 cervical fracture ([place]) 02/1997   Cancer ([place])    melanoma   Depression    Family history of breast cancer    Family history of prostate cancer    GERD (gastroesophageal reflux disease)    Hypertension    Tardive dyskinesia     Patient Active Problem List   Diagnosis Date Noted   Clavicle fracture 10/29/2020   Rib fracture 10/29/2020   Hypokalemia 06/14/2020   Seizure ([place]) 06/13/2020   Impingement syndrome of left shoulder 03/27/2019   Pain in left shoulder 02/21/2019   Rib pain on right side 02/21/2019   Low back pain 02/07/2019   GERD (gastroesophageal reflux disease) 12/09/2018   Anxiety 12/09/2018   Difficulty speaking 12/09/2018   Leukocytosis 12/09/2018   Seizure-like activity ([place])  12/09/2018   Pelvic fracture ([place]) 11/25/2018   Pubic bone fracture ([place]) 11/24/2018   Left knee pain 03/29/2018   Unilateral primary osteoarthritis, left knee 03/29/2018   Genetic testing 01/24/2018   Family history of breast cancer    Family history of prostate cancer    Malignant neoplasm of lower-inner quadrant of right breast of female, estrogen receptor positive ([place]) 10/24/2017   Anemia 06/29/2011   Hypertension    Hyponatremia 06/25/2011   Transaminitis 06/25/2011   Alcohol abuse 06/25/2011   Cough 06/25/2011   Cigarette smoker 06/25/2011    Past Surgical History:  Procedure Laterality Date   ABDOMINAL HYSTERECTOMY     ASPIRATION OF ABSCESS Right 11/20/2017   Procedure: ASPIRATION OF RIGHT AXILLARY SEROMA;  Surgeon: Rolm Bookbinder, MD;  Location: [facility];  Service: General;  Laterality: Right;   AUGMENTATION MAMMAPLASTY Bilateral    biateral implants , approx 2015   BREAST LUMPECTOMY Right 11/02/2017   re-ex 11-20-17   BREAST LUMPECTOMY WITH RADIOACTIVE SEED AND SENTINEL LYMPH NODE BIOPSY Right 11/02/2017   Procedure: BREAST LUMPECTOMY WITH RADIOACTIVE SEED AND SENTINEL LYMPH NODE BIOPSY;  Surgeon: Rolm Bookbinder, MD;  Location: [facility];  Service: General;  Laterality: Right;   CHOLECYSTECTOMY     collarbone     INCONTINENCE SURGERY     KNEE SURGERY     removal of cyst, repair of cartiledge   LAPAROSCOPY  for endometriosis   RE-EXCISION OF BREAST LUMPECTOMY Right 11/20/2017   Procedure: RE-EXCISION OF RIGHT BREAST MARGINS;  Surgeon: Rolm Bookbinder, MD;  Location: [facility];  Service: General;  Laterality: Right;   ROTATOR CUFF REPAIR     left     OB History   No obstetric history on file.     Family History  Problem Relation Age of Onset   COPD Mother    Prostate cancer Father 36       seed implant for treatment   Colon polyps Father        'a few'   Breast cancer Sister 76   Breast cancer  Maternal Aunt        dx >50   Breast cancer Other 54       bilateral   Colon cancer Neg Hx     Social History   Tobacco Use   Smoking status: Every Day    Packs/day: 1.00    Types: Cigarettes   Smokeless tobacco: Never   Tobacco comments:    e-cigs  Vaping Use   Vaping Use: Former  Substance Use Topics   Alcohol use: Yes    Alcohol/week: 21.0 standard drinks    Types: 21 Standard drinks or equivalent per week    Comment: 2 per weekend day; denies during the week (not consistent with prior hx)   Drug use: No    Home Medications Prior to Admission medications   Medication Sig Start Date End Date Taking? Authorizing Provider  albuterol (VENTOLIN HFA) 108 (90 Base) MCG/ACT inhaler Inhale 1-2 puffs into the lungs as needed. 10/28/20   [provider]  ALPRAZolam Duanne Moron) 0.5 MG tablet Take 0.5 mg by mouth 3 (three) times daily as needed. 11/02/20   [provider]  cetirizine (ZYRTEC) 10 MG tablet Take 10 mg by mouth as needed for allergies.    [provider]  chlordiazePOXIDE (LIBRIUM) 25 MG capsule 50mg  PO TID x 1D, then 25-50mg  PO BID X 1D, then 25-50mg  PO QD X 1D 08/31/20   Lacretia Leigh, MD  Cholecalciferol (VITAMIN D3) 50 MCG (2000 UT) capsule Take 2,000 Units by mouth every morning. 04/03/20   [provider]  diphenhydrAMINE (BENADRYL) 25 MG tablet Take 25 mg by mouth at bedtime as needed for sleep.    [provider]  diphenoxylate-atropine (LOMOTIL) 2.5-0.025 MG tablet Take 1 tablet by mouth 4 (four) times daily as needed for diarrhea or loose stools. 11/12/20   Levin Erp, PA  HYDROcodone-acetaminophen (NORCO/VICODIN) 5-325 MG tablet Take 1 tablet by mouth every 6 (six) hours as needed for moderate pain. 10/29/20   Garald Balding, MD  HYDROcodone-acetaminophen (NORCO/VICODIN) 5-325 MG tablet Take 1 tablet by mouth every 6 (six) hours as needed for moderate pain. 10/29/20   Garald Balding, MD  HYDROcodone-acetaminophen  (NORCO/VICODIN) 5-325 MG tablet Take 1 tablet by mouth every 6 (six) hours as needed for moderate pain. 11/05/20   Garald Balding, MD  HYDROcodone-acetaminophen (NORCO/VICODIN) 5-325 MG tablet Take 1 tablet by mouth every 6 (six) hours as needed for moderate pain. 11/16/20   Garald Balding, MD  HYDROcodone-acetaminophen (NORCO/VICODIN) 5-325 MG tablet Take 1 tablet by mouth every 6 (six) hours as needed for moderate pain. 12/01/20   Garald Balding, MD  levETIRAcetam (KEPPRA) 1000 MG tablet Take 1 tablet (1,000 mg total) by mouth 2 (two) times daily. 06/15/20   Aline August, MD  lisinopril (ZESTRIL) 5 MG tablet Take 5  mg by mouth daily. 09/04/20   [provider]  MAGNESIUM-OXIDE 400 (240 Mg) MG tablet Take 1 tablet by mouth daily. 07/17/20   [provider]  mirtazapine (REMERON) 15 MG tablet Take 15 mg by mouth at bedtime. 07/15/20   [provider]  Multiple Vitamin (MULTI VITAMIN DAILY) TABS Take 1 tablet by mouth every morning. 04/03/20   [provider]  oxybutynin (DITROPAN-XL) 10 MG 24 hr tablet Take 10 mg by mouth daily.  12/05/16   [provider]  potassium chloride SA (KLOR-CON) 20 MEQ tablet Take 1 tablet (20 mEq total) by mouth 2 (two) times daily for 7 days. 07/06/20 11/12/20  Arnaldo Natal, MD  RA MELATONIN 10 MG TABS Take 10-20 mg by mouth at bedtime as needed for sleep. 03/29/20   [provider]  traMADol (ULTRAM) 50 MG tablet Take 1 tablet (50 mg total) by mouth every 12 (twelve) hours as needed for up to 6 doses. 10/28/20   Curatolo, Adam, DO  TRELEGY ELLIPTA 100-62.5-25 MCG/INH AEPB Inhale 1 puff into the lungs every morning. 04/03/20   [provider]  TRINTELLIX 5 MG TABS tablet Take 5 mg by mouth daily. 05/13/20   [provider]  Grant Ruts INHUB 250-50 MCG/ACT AEPB Inhale 1 puff into the lungs every 12 (twelve) hours. 10/28/20   [provider]  ZOFRAN 4 MG tablet Take 4 mg by mouth as needed for  nausea/vomiting. 04/03/20   [provider]    Allergies    Percocet [oxycodone-acetaminophen]  Review of Systems   Review of Systems  All other systems reviewed and are negative.  Physical Exam Updated Vital Signs BP (!) 154/103   Pulse (!) 127   Resp (!) 22   SpO2 92%   Physical Exam Vitals and nursing note reviewed.  Constitutional:      General: She is not in acute distress.    Appearance: Normal appearance. She is well-developed.     Comments: Appears intoxicated  HENT:     Head: Normocephalic and atraumatic.  Eyes:     Conjunctiva/sclera: Conjunctivae normal.     Pupils: Pupils are equal, round, and reactive to light.  Cardiovascular:     Rate and Rhythm: Normal rate and regular rhythm.     Heart sounds: Normal heart sounds.  Pulmonary:     Effort: Pulmonary effort is normal. No respiratory distress.     Breath sounds: Normal breath sounds.  Abdominal:     General: There is no distension.     Palpations: Abdomen is soft.     Tenderness: There is no abdominal tenderness.  Musculoskeletal:        General: No deformity. Normal range of motion.     Cervical back: Normal range of motion and neck supple.  Skin:    General: Skin is warm and dry.  Neurological:     General: No focal deficit present.     Mental Status: She is alert and oriented to person, place, and time.    ED Results / Procedures / Treatments   Labs (all labs ordered are listed, but only abnormal results are displayed) Labs Reviewed  ETHANOL  COMPREHENSIVE METABOLIC PANEL  CBC WITH DIFFERENTIAL/PLATELET  SALICYLATE LEVEL  ACETAMINOPHEN LEVEL    EKG None  Radiology No results found.  Procedures Procedures   Medications Ordered in ED Medications - No data to display  ED Course  I have reviewed the triage vital signs and the nursing notes.  Pertinent labs &  imaging results that were available during my care of the patient were reviewed by me and considered in my medical  decision making (see chart for details).    MDM Rules/Calculators/A&P                           MDM  MSE complete  ANAIH BRANDER was evaluated in Emergency Department on 12/26/2020 for the symptoms described in the history of present illness. She was evaluated in the context of the global COVID-19 pandemic, which necessitated consideration that the patient might be at risk for infection with the SARS-CoV-2 virus that causes COVID-19. Institutional protocols and algorithms that pertain to the evaluation of patients at risk for COVID-19 are in a state of rapid change based on information released by regulatory bodies including the CDC and federal and state organizations. These policies and algorithms were followed during the patient's care in the ED.  Patient is presenting on IVC hold.    IVC was initiated by the predictions caregiver PTA.  Patient's is clinically intoxicated.  She does endorse recent fall.  IVC reports frequent and daily EtOH use and abuse.  IVC reports that patient with very unsafe behavior while intoxicated.  Patient is noted to be intoxicated on exam with EtOH of 361.  Screening labs and imaging are without evidence of significant medical pathology that would interfere with psychiatric evaluation.  TTS eval initiated.  Final disposition dependent upon psychiatric evaluation and assessment.  Final Clinical Impression(s) / ED Diagnoses Final diagnoses:  Alcoholic intoxication without complication [facility])    Rx / DC Orders ED Discharge Orders     None        Valarie Merino, MD 12/26/20 2214

## 2020-12-26 NOTE — ED Triage Notes (Signed)
Patient arrives via [employer] for ETOH. Family reports pt has been seen multiple times for frequent falls related to alcohol  intoxication. [employer] was called out tonight due to pt's excessive drinking. Pt was unwilling to go to hospital so family took out IVC.

## 2020-12-27 DIAGNOSIS — F10220 Alcohol dependence with intoxication, uncomplicated: Secondary | ICD-10-CM | POA: Diagnosis not present

## 2020-12-27 MED ORDER — ONDANSETRON 4 MG PO TBDP
4.0000 mg | ORAL_TABLET | Freq: Once | ORAL | Status: AC
  Administered 2020-12-27: 4 mg via ORAL
  Filled 2020-12-27: qty 1

## 2020-12-27 MED ORDER — ONDANSETRON 8 MG PO TBDP
8.0000 mg | ORAL_TABLET | Freq: Once | ORAL | Status: AC
  Administered 2020-12-27: 8 mg via ORAL
  Filled 2020-12-27: qty 1

## 2020-12-27 NOTE — ED Notes (Signed)
Patient has mild tremors at baseline.

## 2020-12-27 NOTE — ED Provider Notes (Signed)
75 year old female who arrived under IVC.  She was medically cleared and has been evaluated by our psychiatric team.  She has been cleared by psych for outpatient follow-up.  She is declined any substance abuse services.  She has a safe place to be discharged to.  Stable at time of discharge.   Lorelle Gibbs, DO 12/27/20 1657

## 2020-12-27 NOTE — BH Assessment (Signed)
Requested cart at 12:06.

## 2020-12-27 NOTE — ED Notes (Signed)
Pt's blue [employer] bag is in the 16-18/Res A cabinet

## 2020-12-27 NOTE — ED Notes (Signed)
Pt dressed in purple scrubs

## 2020-12-27 NOTE — ED Notes (Signed)
Patient had episode of emesis. Was changed into clean scrubs and new bedding was put on.

## 2020-12-27 NOTE — BH Assessment (Signed)
Clinician messaged Felicia D. Nyoka Cowden, RN: "Hey. It's Trey with TTS. Is the pt able to be assessed? If so, can you fax the pt's IVC paperwork [phone])."    Clinician awaiting response.    Vertell Novak, [place], [facility], [facility] Triage Specialist [phone]

## 2020-12-27 NOTE — ED Notes (Signed)
Breakfast tray given. °

## 2020-12-27 NOTE — BH Assessment (Addendum)
Comprehensive Clinical Assessment (CCA) Note  12/27/2020 Mindy Young [id number]  Disposition:  Consulted with Mindy Banks, NP, who determined that Pt does not meet inpatient criteria as Pt denied suicidal ideation, homicidal ideation, hallucination, and self-injurious behavior.  Also, she denied services for alcohol use.  She is psych-cleared as she has no safety concerns.  Pt will return home where she lives with a caretaker.  The patient demonstrates the following risk factors for suicide: Chronic risk factors for suicide include: substance use disorder. Acute risk factors for suicide include: family or marital conflict. Protective factors for this patient include: positive social support. Considering these factors, the overall suicide risk at this point appears to be low. Patient is appropriate for outpatient follow up.   [facility] from 12/19/2020 in [facility] from 11/06/2020 in [facility] ED from 10/28/2020 in [facility] No Risk No Risk No Risk       Chief Complaint:  Chief Complaint  Patient presents with   IVC   Alcohol Intoxication    Pt is currently under IVC by [facility] due to alcohol use.  Per report, Pt drinks to intoxication and falls.   Visit Diagnosis: Alcohol Dependence, Severe   Narrative:  Pt is a 75 year old female who presented to [facility] under IVC (petitioner is [facility]) who brought Pt to hospital due to her chronic use of alcohol.  Pt lives with a caretaker in [place], and she receives psychotropic medication management from her PCP for treatment of depression.  Pt is retired.  Her BAC on admission was 361.  Pt is under IVC by [facility] after police were called to her home yesterday.  Family called police because they were concerned about her drinking -- per report, Pt is frequently intoxicated, and when intoxicated, she falls.  Pt also has a history  of diarrhea and seizures.  When Pt refused to go to the hospital, [place] petitioned for IVC.  Pt reported that she is depressed because of recent divorce from husband.  She stated that she has consumed alcohol over the last 50 years but that over the last six months, her use has increased as she has become despondent about her divorce.  Pt endorsed daily use of whiskey -- varied quantity.  She acknowledged use until blacking out, diarrhea.  Pt also has a history of seizures.  Pt endorsed despondency, poor sleep, feelings of worthlessness and hopelessness, and isolation.  Pt also endorsed worry and body tension.  She stated that her PCP prescribes Trintellix and Xanax for depressive and anxious symptoms.  Pt said that she does never sought treatment for alcohol use, and she does not want treatment.  Spoke with Pt's caretaker Mindy Young who lives with Pt.  Per [name], Pt fell because she was intoxicated.  [name] called 911.  Paramedics arrived and treated her on the scene.  Pt was alert.   Pt refused further physical treatment and alcohol use.  Per [name], Pt drinks daily, ''all day and night,'' ''she wakes up and smokes and drinks.''    During assessment, Pt presented as alert and oriented.  She had good eye contact and was cooperative.  Pt was appropriately groomed.  Pt's mood was depressed.  Affect was full range.  Speech was normal in rate, rhythm, and volume.  Thought processes were within normal range, and thought content was logical and goal-oriented.  There was  no evidence of delusion.  Memory and concentration were intact.  Insight, judgment, and impulse control were poor.  CCA Screening, Triage and Referral (STR)  Patient Reported Information How did you hear about [place]? Other (Comment) ([facility])  What Is the Reason for Your Visit/Call Today? Pt is under IVC due to ongoing alcohol use.  How Long Has This Been Causing You Problems? > than 6 months  What Do You Feel Would Help You the Most  Today? Alcohol or Drug Use Treatment (Pt stated that she does not want treatment)   Have You Recently Had Any Thoughts About Hurting Yourself? No  Are You Planning to Commit Suicide/Harm Yourself At This time? No   Have you Recently Had Thoughts About [place]? No  Are You Planning to Harm Someone at This Time? No  Explanation: No data recorded  Have You Used Any Alcohol or Drugs in the Past 24 Hours? Yes  How Long Ago Did You Use Drugs or Alcohol? No data recorded What Did You Use and How Much? Unknown quantity of whiskey.  BAC on admission was 361.   Do You Currently Have a Therapist/Psychiatrist? No  Name of Therapist/Psychiatrist: No data recorded  Have You Been Recently Discharged From Any Office Practice or Programs? No  Explanation of Discharge From Practice/Program: No data recorded    CCA Screening Triage Referral Assessment Type of Contact: Tele-Assessment  Telemedicine Service Delivery: Telemedicine service delivery: This service was provided via telemedicine using a 2-way, interactive audio and video technology  Is this Initial or Reassessment? Initial Assessment  Date Telepsych consult ordered in [facility]:  12/27/20  Time Telepsych consult ordered in [facility]:  No data recorded Location of Assessment: [facility]  Provider Location: [facility]   Collateral Involvement: No data recorded  Does Patient Have a [place]? No data recorded Name and Contact of Legal Guardian: No data recorded If Minor and Not Living with Parent(s), Who has Custody? No data recorded Is CPS involved or ever been involved? Never  Is APS involved or ever been involved? Never   Patient Determined To Be At Risk for Harm To Self or Others Based on Review of Patient Reported Information or Presenting Complaint? No data recorded Method: No data recorded Availability of Means: No data recorded Intent: No data recorded Notification Required: No  data recorded Additional Information for Danger to Others Potential: No data recorded Additional Comments for Danger to Others Potential: No data recorded Are There Guns or Other Weapons in Your Home? No data recorded Types of Guns/Weapons: No data recorded Are These Weapons Safely Secured?                            No data recorded Who Could Verify You Are Able To Have These Secured: No data recorded Do You Have any Outstanding Charges, Pending Court Dates, Parole/Probation? No data recorded Contacted To Inform of Risk of Harm To Self or Others: No data recorded   Does Patient Present under Involuntary Commitment? Yes  IVC Papers Initial File Date: 12/27/20   [place] of Residence: [place]   Patient Currently Receiving the Following Services: Medication Management (through PCP)   Determination of Need: Urgent (48 hours)   Options For Referral: Outpatient Therapy; Inpatient Hospitalization; Medication Management     CCA Biopsychosocial Patient Reported Schizophrenia/Schizoaffective Diagnosis in Past: No   Strengths: No data recorded  Mental Health Symptoms Depression:   Change in energy/activity;  Difficulty Concentrating; Fatigue; Hopelessness; Sleep (too much or little); Worthlessness   Duration of Depressive symptoms:  Duration of Depressive Symptoms: Greater than two weeks   Mania:   None   Anxiety:    Worrying; Tension   Psychosis:   None   Duration of Psychotic symptoms:    Trauma:   None   Obsessions:   None   Compulsions:   None   Inattention:   None   Hyperactivity/Impulsivity:   None   Oppositional/Defiant Behaviors:   None   Emotional Irregularity:   Potentially harmful impulsivity   Other Mood/Personality Symptoms:  No data recorded   Mental Status Exam Appearance and self-care  Stature:   Average   Weight:   Average weight   Clothing:   Casual   Grooming:   Normal   Cosmetic use:   Age appropriate   Posture/gait:    Normal   Motor activity:   Not Remarkable   Sensorium  Attention:   Normal   Concentration:   Normal   Orientation:   X5   Recall/memory:   Normal   Affect and Mood  Affect:   Blunted   Mood:   Depressed   Relating  Eye contact:   Normal   Facial expression:   Responsive   Attitude toward examiner:   Cooperative   Thought and Language  Speech flow:  Clear and Coherent   Thought content:   Appropriate to Mood and Circumstances   Preoccupation:   None   Hallucinations:   None   Organization:  No data recorded  [employer] of Knowledge:   Average   Intelligence:   Average   Abstraction:   Normal   Judgement:   Poor   Reality Testing:   Adequate   Insight:   Poor   Decision Making:   Impulsive   Social Functioning  Social Maturity:   Impulsive   Social Judgement:   Heedless   Stress  Stressors:   Family conflict   Coping Ability:   Exhausted   Skill Deficits:   Activities of daily living   Supports:   Family     Religion:    Leisure/Recreation:    Exercise/Diet: Exercise/Diet Do You Follow a Special Diet?: No Do You Have Any Trouble Sleeping?: Yes Explanation of Sleeping Difficulties: Wakes up after three-four hours of sleep   CCA Employment/Education Employment/Work Situation: Employment / Work Situation Employment Situation: Retired Has Patient ever Been in Passenger transport manager?: No  Education: Education Is Patient Currently Attending School?: No   CCA Family/Childhood History Family and Relationship History: Family history Marital status: Divorced Divorced, when?: Within the last year What types of issues is patient dealing with in the relationship?: Stress due to division of property Does patient have children?: Yes How many children?: 2  Childhood History:  Childhood History Did patient suffer any verbal/emotional/physical/sexual abuse as a child?: No Did patient suffer from  severe childhood neglect?: No Has patient ever been sexually abused/assaulted/raped as an adolescent or adult?: No Was the patient ever a victim of a crime or a disaster?: No Witnessed domestic violence?: No Has patient been affected by domestic violence as an adult?: No  Child/Adolescent Assessment:     CCA Substance Use Alcohol/Drug Use: Alcohol / Drug Use Pain Medications: Please see MAR Prescriptions: Please see MAR Over the Counter: Please see MAR History of alcohol / drug use?: Yes Longest period of sobriety (when/how long): Not sure Negative Consequences of Use: Personal relationships Withdrawal  Symptoms: Agitation, Blackouts, Diarrhea, Seizures Substance #1 Name of Substance 1: Alcohol 1 - Age of First Use: 25 1 - Amount (size/oz): Varied 1 - Frequency: Daily 1 - Duration: Ongoing 1 - Last Use / Amount: 12/27/2020 -- not sure of amount.  BAC on admission was 361 1 - Method of Aquiring: Purchase 1- Route of Use: Oral ingestion                       ASAM's:  Six Dimensions of Multidimensional Assessment  Dimension 1:  Acute Intoxication and/or Withdrawal Potential:   Dimension 1:  Description of individual's past and current experiences of substance use and withdrawal: Daily use for the last six months -- Pt's UDS on admission was 361  Dimension 2:  Biomedical Conditions and Complications:   Dimension 2:  Description of patient's biomedical conditions and  complications: Per report, Pt falls frequently when intoxicated; has a history of diarrhea and seizures  Dimension 3:  Emotional, Behavioral, or Cognitive Conditions and Complications:  Dimension 3:  Description of emotional, behavioral, or cognitive conditions and complications: Depressed  Dimension 4:  Readiness to Change:  Dimension 4:  Description of Readiness to Change criteria: Ambivalent  Dimension 5:  Relapse, Continued use, or Continued Problem Potential:  Dimension 5:  Relapse, continued use, or  continued problem potential critiera description: Relapse likely  Dimension 6:  Recovery/Living Environment:  Dimension 6:  Recovery/Iiving environment criteria description: Pt lives alone  ASAM Severity Score: ASAM's Severity Rating Score: 14  ASAM Recommended Level of Treatment: ASAM Recommended Level of Treatment: Level II Intensive Outpatient Treatment   Substance use Disorder (SUD) Substance Use Disorder (SUD)  Checklist Symptoms of Substance Use: Continued use despite having a persistent/recurrent physical/psychological problem caused/exacerbated by use, Evidence of tolerance  Recommendations for Services/Supports/Treatments: Recommendations for Services/Supports/Treatments Recommendations For Services/Supports/Treatments: SAIOP (Substance Abuse Intensive Outpatient Program)  Discharge Disposition:    DSM5 Diagnoses: Patient Active Problem List   Diagnosis Date Noted   Alcohol dependence with uncomplicated intoxication ([place])    Clavicle fracture 10/29/2020   Rib fracture 10/29/2020   Hypokalemia 06/14/2020   Seizure ([place]) 06/13/2020   Impingement syndrome of left shoulder 03/27/2019   Pain in left shoulder 02/21/2019   Rib pain on right side 02/21/2019   Low back pain 02/07/2019   GERD (gastroesophageal reflux disease) 12/09/2018   Anxiety 12/09/2018   Difficulty speaking 12/09/2018   Leukocytosis 12/09/2018   Seizure-like activity ([place]) 12/09/2018   Pelvic fracture ([place]) 11/25/2018   Pubic bone fracture ([place]) 11/24/2018   Left knee pain 03/29/2018   Unilateral primary osteoarthritis, left knee 03/29/2018   Genetic testing 01/24/2018   Family history of breast cancer    Family history of prostate cancer    Malignant neoplasm of lower-inner quadrant of right breast of female, estrogen receptor positive ([place]) 10/24/2017   Anemia 06/29/2011   Hypertension    Hyponatremia 06/25/2011   Transaminitis 06/25/2011   Alcohol abuse 06/25/2011   Cough 06/25/2011   Cigarette  smoker 06/25/2011     Referrals to Alternative Service(s): Referred to Alternative Service(s):   Place:   Date:   Time:    Referred to Alternative Service(s):   Place:   Date:   Time:    Referred to Alternative Service(s):   Place:   Date:   Time:    Referred to Alternative Service(s):   Place:   Date:   Time:     Marlowe Aschoff, [facility]

## 2020-12-27 NOTE — ED Notes (Addendum)
Patient reported an unwitnessed fall. Patient was trying to use commode. Patient reports no injuries, states "caught my foot". Patient back in bed with bed alarm turned on and door open for observation. SZP filed, neuro, muscular, and skin checks performed.

## 2020-12-29 ENCOUNTER — Ambulatory Visit (HOSPITAL_COMMUNITY)
Admission: EM | Admit: 2020-12-29 | Discharge: 2020-12-29 | Disposition: A | Discharge status: Home / Self Care | Payer: Medicare Other | Attending: Registered Nurse | Admitting: Registered Nurse

## 2020-12-29 ENCOUNTER — Encounter (HOSPITAL_COMMUNITY): Payer: Self-pay | Admitting: Registered Nurse

## 2020-12-29 ENCOUNTER — Telehealth: Payer: Self-pay | Admitting: Neurology

## 2020-12-29 DIAGNOSIS — F1020 Alcohol dependence, uncomplicated: Secondary | ICD-10-CM | POA: Diagnosis present

## 2020-12-29 DIAGNOSIS — F1029 Alcohol dependence with unspecified alcohol-induced disorder: Secondary | ICD-10-CM | POA: Diagnosis present

## 2020-12-29 DIAGNOSIS — F10220 Alcohol dependence with intoxication, uncomplicated: Secondary | ICD-10-CM

## 2020-12-29 DIAGNOSIS — F1010 Alcohol abuse, uncomplicated: Secondary | ICD-10-CM | POA: Diagnosis not present

## 2020-12-29 NOTE — Discharge Instructions (Addendum)
[facility] [address] [place], [place] [address] Open 24 hours Phone: [phone]

## 2020-12-29 NOTE — Discharge Summary (Signed)
Mindy Young to be D/C'd Home per NP order. Discussed with the patient and all questions fully answered. An After Visit Summary was printed and given to the patient. Patient escorted out, and D/C home via private auto.  Mindy Young  12/29/2020 5:31 PM

## 2020-12-29 NOTE — Progress Notes (Signed)
Patient presents tot he [facility] for her alcohol problem.  Patient states that she had "seven mini seizures" yesterday. Patient has been admitted to the Emergency Departments at [facility] twice in the past week because she has been drinking and falling around a lot.  Patient is drinking a fifth of liquor daily.  Patient states that she drinks throughout the day.  Patient states that she started drinking to numb her depression and to stop her racing thoughts.  Patient has been vomiting a lot lately.  Patient denies SI/HI/Psychosis.  Patient states that she does not want to be here at times, but patient states that she really does not want to die.  She states that her drinking started after her divorce a year and a half ago. Patient states that she has never been hospitalized in a psychiatric unit.  However, she states that she went to a rehab center in [place] this past February, but she states that they never treated her depression. Patient states that she is not sleeping andshe has no appetite.  Patient was IVC'd over the weekend, but was psych cleared for discharge. Patient is urgent.

## 2020-12-29 NOTE — ED Provider Notes (Signed)
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 female patient presented to [facility] as a walk in accompanied by her caregiver requesting resources for alcohol detox and outpatient psychiatric services.   Mindy Young, 75 y.o., female patient seen face to face by this provider, consulted with Dr. Ernie Hew; and chart reviewed on 12/29/20.  On evaluation Mindy Young reports she was recently discharged from the hospital related to a fall from being intoxicated.  Patient is requesting outpatient psychiatric services and community services for depression, anxiety, and alcohol abuse.  Patient reports that she lives alone but her caregiver is there with her every day most of the day and has spent the night with her on several occasions.  Patient also reports she does have outpatient psychiatric services and she is being prescribed Trintellix but does not feel the medication is working and wanting to find another provider.  Patient encouraged to stay with her current psychiatric provider until she is excepted at a new practice related to her taking so long to get a visit.  Patient denies suicidal/self-harm/homicidal ideations, psychosis, paranoia. During evaluation Mindy Young is sitting upright in chair in no acute distress.  She is alert/oriented x 4; calm/cooperative; and mood congruent with affect.  She is speaking in a clear tone at moderate volume, and normal pace; with good eye contact.  Her thought process is coherent and relevant; There is no indication that she is currently responding to internal/external stimuli or experiencing delusional thought content; and she has  denied suicidal/self-harm/homicidal ideation, psychosis, and paranoia.   Patient has remained calm throughout assessment and has answered questions appropriately.    At this time Mindy Young is educated and verbalizes understanding of mental health resources and other crisis services in the community. She is instructed to call 911 and present to the nearest emergency room should she experience any suicidal/homicidal ideation, auditory/visual/hallucinations, or detrimental worsening of her mental health condition.  She was a also advised by Probation officer that she could call the toll-free phone on insurance card to assist with identifying in network counselors and agencies.     Psychiatric Specialty Exam  Presentation  General Appearance:Appropriate for Environment  Eye Contact:Good  Speech:Clear and Coherent; Normal Rate  Speech Volume:Normal  Handedness:Right   Mood and Affect  Mood:Dysphoric  Affect:Congruent   Thought Process  Thought Processes:Coherent; Goal Directed  Descriptions of Associations:Intact  Orientation:Full (Time, Place and Person)  Thought Content:Logical; WDL  Diagnosis of Schizophrenia or Schizoaffective disorder in past: No   Hallucinations:None  Ideas of Reference:None  Suicidal Thoughts:No  Homicidal Thoughts:No   Sensorium  Memory:Immediate Good; Recent Good; Remote Good  Judgment:Intact  Insight:Present   Executive Functions  Concentration:Good  Attention Span:Good  [place] of Knowledge:Good  Language:Good   Psychomotor Activity  Psychomotor Activity:Normal   Assets  Assets:Communication Skills; Desire for Improvement; Financial Resources/Insurance; Housing; Resilience; Social Support   Sleep  Sleep:Fair  Number of hours: No data recorded  Nutritional Assessment (For OBS and FBC admissions only) Has the patient had a weight loss or gain of 10 pounds or more in the last 3 months?: No Has the patient had a  decrease in food intake/or  appetite?: No Does the patient have dental problems?: No Does the patient have eating habits or behaviors that may be indicators of an eating disorder including binging or inducing vomiting?: No Has the patient recently lost weight without trying?: 0 Has the patient been eating poorly because of a decreased appetite?: 0 Malnutrition Screening Tool Score: 0    Physical Exam: Physical Exam Vitals and nursing note reviewed. Exam conducted with a chaperone present.  Constitutional:      General: She is not in acute distress.    Appearance: Normal appearance. She is not ill-appearing.  Cardiovascular:     Rate and Rhythm: Normal rate.  Pulmonary:     Effort: Pulmonary effort is normal.  Musculoskeletal:        General: Normal range of motion.     Cervical back: Normal range of motion.  Skin:    General: Skin is warm and dry.  Neurological:     Mental Status: She is alert and oriented to person, place, and time.  Psychiatric:        Attention and Perception: Attention and perception normal.        Mood and Affect: Affect normal. Mood is depressed.        Speech: Speech normal.        Behavior: Behavior normal. Behavior is cooperative.        Thought Content: Thought content normal. Thought content is not paranoid or delusional. Thought content does not include homicidal or suicidal ideation.        Cognition and Memory: Cognition and memory normal.        Judgment: Judgment normal.   Review of Systems  Constitutional: Negative.   HENT: Negative.    Eyes: Negative.   Respiratory: Negative.    Cardiovascular: Negative.   Gastrointestinal: Negative.   Genitourinary: Negative.   Musculoskeletal:  Positive for falls (Related to intoxication) and myalgias.  Skin: Negative.   Neurological:  Positive for seizures (Reporting history of seizures.  Seeing a neurologist.  Encourged to follow up with neurologist).  Endo/Heme/Allergies: Negative.    Psychiatric/Behavioral:         Care giver is buying the alcohol. Reports drinks daily on/off through out day.  Last drink was last night.   Blood pressure 104/72, pulse (!) 110, temperature 98.2 F (36.8 C), temperature source Oral, resp. rate 18, SpO2 100 %. There is no height or weight on file to calculate BMI.  Musculoskeletal: Strength & Muscle Tone: within normal limits Gait & Station: normal Patient leans: N/A   [place] [facility] Discharge Disposition for Follow up and Recommendations: Based on my evaluation the patient does not appear to have an emergency medical condition and can be discharged with resources and follow up care in outpatient services for Medication Management, Substance Abuse Intensive Outpatient Program, Individual Therapy, and Group Therapy   Follow-up Information     [facility]   Specialty: Behavioral Health Why: Referral sent in for outpatient therapy.  If you don't hear anything by 10:00 tomorrow call the above number to set up services Contact information: [place].   Specialty: Behavioral Health Why: [facility] Triage Times (no appointment necessary).  (offers outpatient therapy and intensive outpatient substance abuse therapy). Contact information: [place] [address] [phone]         Call  [facility]: Medical illustrator Why: schedule an appointment for medication management and therapy Contact information: [facility] [place] [address] [phone]                  Discharge Instructions      [facility] [address] [place], [place] [address] Open 24 hours Phone: [phone]       Earleen Newport, NP 12/29/2020, 5:22 PM

## 2020-12-29 NOTE — Telephone Encounter (Signed)
Patient's caregiver Mindy Young called and said the patient is having increased seizure activity, six minor one's yesterday.  She scheduled an appointment with Dr. Carles Collet 01/25/21 at 8:45. She has requested to be added to wait list.  [name] is requesting to speak with a clinical staff member.  Okay to add to wait list?

## 2020-12-30 ENCOUNTER — Telehealth (HOSPITAL_COMMUNITY): Payer: Self-pay | Admitting: Student

## 2020-12-30 ENCOUNTER — Telehealth (HOSPITAL_COMMUNITY): Payer: Self-pay | Admitting: Psychiatry

## 2020-12-30 NOTE — Telephone Encounter (Signed)
Pt caregiver called back no answer no voice mail set up. When they call back pt needs to be add to Sara's schedule per Dr Tat. You can transfer pt caregiver to clinical staff and we will let them know why she is being placed on Sara's schedule.

## 2020-12-30 NOTE — Telephone Encounter (Signed)
D:  Mindy Rankin, NP referred pt to CD-IOP.  A:  Placed call to orient pt.  Pt states she is interested in a group.  Patient is scheduled to meet with [name] virtually on 01-06-21 @ 3pm.  Pt is requesting to be called because she doesn't check her emails.  Patient is requesting that her care-giver is called if she doesn't answer her phone on 01-06-21; so the phone can be handed to her. Caregivers name is [name] [phone].  The caregiver was on the phone with patient.  Inform Beth and [employer].  R:  Pt receptive.

## 2020-12-30 NOTE — Telephone Encounter (Signed)
Called the caregiver and patient phone number and the VM was not set up for the caregiver and the patient VM was full.

## 2020-12-30 NOTE — BH Assessment (Signed)
Care Management - Follow Up [facility] Discharges   Writer made contact with the patient. Patient is scheduled to meet with Binnie Rail, LCAS virtually on 01-06-21 @ 3pm.  Patient requested that I speak to her caregiver [name] [phone] regarding her appt.  Writer confirmed with the caregiver regarding the patient's appointment.

## 2020-12-31 NOTE — Telephone Encounter (Signed)
Called and spoke with [name], caregiver, and scheduled patient with Sharene Butters, PA on 01/01/21 at 11:00AM.  I attempted to transfer the call to a clinical staff member to no avail.

## 2020-12-31 NOTE — Telephone Encounter (Signed)
No return call needed caregiver is happy that pt is being seen sooner than later.

## 2021-01-01 ENCOUNTER — Other Ambulatory Visit (INDEPENDENT_AMBULATORY_CARE_PROVIDER_SITE_OTHER): Payer: Medicare Other

## 2021-01-01 ENCOUNTER — Other Ambulatory Visit: Payer: Self-pay

## 2021-01-01 ENCOUNTER — Ambulatory Visit: Payer: Medicare Other | Admitting: Physician Assistant

## 2021-01-01 ENCOUNTER — Encounter: Payer: Self-pay | Admitting: Physician Assistant

## 2021-01-01 VITALS — BP 144/95 | HR 102 | Resp 18 | Ht 62.0 in | Wt 146.0 lb

## 2021-01-01 DIAGNOSIS — R569 Unspecified convulsions: Secondary | ICD-10-CM

## 2021-01-01 NOTE — Patient Instructions (Addendum)
Check Keppra Level  Continue for now with Keppra 1000 mg twice daily unless the Keppra levels are not adequate Follow up in 2 months  Your provider has requested that you have labwork completed today. Please go to [facility] ([phone]) on the second floor of this building before leaving the office today. You do not need to check in. If you are not called within 15 minutes please check with the front desk.

## 2021-01-01 NOTE — Progress Notes (Signed)
Assessment/Plan:   1.  Seizure  -Quite sometime ago, she had seizures with an abnormal EEG, demonstrating left temporal seizure.  Ambulatory 48-hour EEG was normal.   Her history is also quite complicated by the fact that she is an alcoholic, suspect that alcohol withdrawal seizure could be playing a role here as well, if not the primary role.  Last sodium on 12/26/2020 was 135.  She continue to drink heavy amounts..  Needs complete cessation under medical supervision. Check Keppra levels For now continue Keppra 1000 mg twice daily, will do adjustment depending on the levels Inpatient EEG was offered, the patient wants to defer for now Follow-up in 2 months Agree with another inpatient alcohol intervention   Subjective:   Mindy Young was seen today in follow up for seizure.  My previous records as well as any outside records available were reviewed prior to todays visit.  Pt with caregiver who supplements the history.  She was last seen at our office on 08/03/2020.  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  Patient is a 75 year old female with a history of alcohol abuse, seizure, noncompliance with medication and follow-up.  Since her last visit, the patient had several hospital presentations for alcohol intoxication, on 12/29/2020, 12/26/2020, 12/19/2020, 11/06/2020.  She is supposed to be on Keppra at 1000 mg twice daily, but she is noncompliant with the medication. Routine EEG on June 14, 2020 was normal, and a 48-hour EEG was essentially unremarkable.  She had multiple admissions for detoxification, last in February 2022, and she is planning to have another intervention in the near future because "quitting cold [place] is not a good thing "., she continues to drink more than a half a bottle to a bottle of hard liquor, and no less than 1/5.  Her sitter reports that she may have had what appears to be "mini seizures lasting for about 3 to 4 minutes,  total count of 5 or 6, described as shaking, not able to move, unable to speak, she could hear but if she was unable to respond.  Then on November 8 in November 9, she had 2 episodes, and on Wednesday, November 10, she had 1 episode that lasted for about 1 minute to which the patient replies that this may not have been a seizure, but "it may have been a detox problem ".     Prior notes re: seizure in 2021:  She was in the hospital in October with seizure.  Patient was at the emergency room the day prior to admission.  She was there for pain in the hip following a fall as well as constipation.  She was evaluated and sent home.  The home health care aide check on her around 11 am, and thought she was fine and around 1130, she had an episode where she could not bring out her words and she was staring into space.  She did not lose consciousness, but was not necessarily responding to questions.  This apparently happened several times.  She was transferred from the [facility] to [facility], and while in route she had an episode in the ambulance where she had left-sided gaze preference and was not responding with the eyes open.  She had weakness on the left side.  She was able to follow commands with the right side of the body.  She apparently also had a similar episode while in the emergency room.  Sodium  was very low at 121.  She had an EEG.  She had multiple episodes during the EEG.  Episodes correlated with left temporal seizures.  She saw neurology/epilepsy at the hospital.  It was felt that these could have been provoked in the setting of hyponatremia.  However, because of the frequency of the seizures, AEDs were initiated.  She was started on Keppra, as well as pyridoxine.  It was noted that once the hyponatremia was corrected, the patient did not have any further seizures, it was possible that the patient could be weaned off her AED.  Patient was discharged on Keppra, 1000 mg twice per day.  She  states that she is not taking as directed.  She is only taking q day for the last week or so. Compliance is better when caregivers around.  Caregivers have noted that mood/agitation better but she has recently cut out lots of medication, not just the Keppra.   She was told to follow-up here.  She actually had several visits which she canceled.  Pt states that she has had no alcohol since 09/2018.     CURRENT MEDICATIONS:  Outpatient Encounter Medications as of 01/01/2021  Medication Sig   albuterol (VENTOLIN HFA) 108 (90 Base) MCG/ACT inhaler Inhale 1-2 puffs into the lungs every 4 (four) hours as needed for wheezing or shortness of breath.   ALPRAZolam (XANAX) 0.5 MG tablet Take 0.5 mg by mouth 3 (three) times daily as needed for anxiety.   Cyanocobalamin 3000 MCG CAPS Take 3,000 mcg by mouth every morning. Vitamin B12   diphenhydrAMINE (BENADRYL) 25 MG tablet Take 50 mg by mouth at bedtime.   diphenoxylate-atropine (LOMOTIL) 2.5-0.025 MG tablet Take 1 tablet by mouth 4 (four) times daily as needed for diarrhea or loose stools.   fluticasone-salmeterol (ADVAIR) 250-50 MCG/ACT AEPB Inhale 1 puff into the lungs 2 (two) times daily as needed (shortness of breath/wheezing).   levETIRAcetam (KEPPRA) 1000 MG tablet Take 1 tablet (1,000 mg total) by mouth 2 (two) times daily. (Patient taking differently: Take 1,000 mg by mouth every 12 (twelve) hours.)   lisinopril (ZESTRIL) 5 MG tablet Take 5 mg by mouth every morning.   mirtazapine (REMERON) 15 MG tablet Take 15 mg by mouth at bedtime.   ondansetron (ZOFRAN) 4 MG tablet Take 4 mg by mouth every 8 (eight) hours as needed for nausea or vomiting.   oxybutynin (DITROPAN-XL) 10 MG 24 hr tablet Take 10 mg by mouth every morning.   vortioxetine HBr (TRINTELLIX) 5 MG TABS tablet Take 5 mg by mouth every morning.   HYDROcodone-acetaminophen (NORCO/VICODIN) 5-325 MG tablet Take 1 tablet by mouth every 6 (six) hours as needed for moderate pain. (Patient not  taking: Reported on 01/01/2021)   OVER THE COUNTER MEDICATION Take 2 tablets by mouth at bedtime. Sleep 3: GABA 100 mg, Ashwagandha 60 mg, melatonin 10 mg   pantoprazole (PROTONIX) 40 MG tablet Take 40 mg by mouth every morning. (Patient not taking: Reported on 01/01/2021)   No facility-administered encounter medications on file as of 01/01/2021.     Objective:   PHYSICAL EXAMINATION:    VITALS:   Vitals:   01/01/21 1055  BP: (!) 144/95  Pulse: (!) 102  Resp: 18  SpO2: 96%  Weight: 146 lb (66.2 kg)  Height: 5\' 2"  (1.575 m)    GEN:  The patient appears stated age and is in NAD. HEENT:  Normocephalic, atraumatic.  The mucous membranes are moist. The superficial temporal arteries are without ropiness or  tenderness. CV:  RRR Lungs:  CTAB Neck/HEME:  There are no carotid bruits bilaterally.  Neurological examination:  Orientation: The patient is alert and oriented to person/place.   Cranial nerves: There is good facial symmetry.The speech is fluent and clear but mild dysarthria is noted. Soft palate rises symmetrically and there is no tongue deviation. Hearing is intact to conversational tone. Sensation: Sensation is intact to light touch throughout Motor: Strength is at least antigravity x4.  Movement examination: Tone: There is normal tone in the UE/LE Abnormal movements: very mild bilateral intention tremor UE .  No myoclonus.  No asterixis.   Coordination:  There is no decremation with RAM's. Gait and Station: The patient has no difficulty arising out of a deep-seated chair without the use of the hands. The patient's stride length is good but she is wide based and a bit unbalanced.    Total time spent on today's visit was 30 minutes, including both face-to-face time and nonface-to-face time.  Time included that spent on review of records (prior notes available to me/labs/imaging if pertinent), discussing treatment and goals, answering patient's questions and coordinating  care.  Cc:  Cipriano Mile, NP Sharene Butters, PA-C

## 2021-01-06 ENCOUNTER — Other Ambulatory Visit: Payer: Self-pay

## 2021-01-06 ENCOUNTER — Ambulatory Visit (INDEPENDENT_AMBULATORY_CARE_PROVIDER_SITE_OTHER): Payer: Medicare Other | Admitting: Licensed Clinical Social Worker

## 2021-01-06 ENCOUNTER — Encounter (HOSPITAL_COMMUNITY): Payer: Self-pay | Admitting: Licensed Clinical Social Worker

## 2021-01-06 DIAGNOSIS — F1020 Alcohol dependence, uncomplicated: Secondary | ICD-10-CM

## 2021-01-06 LAB — LEVETIRACETAM LEVEL: Levetiracetam Lvl: 25.7 ug/mL (ref 10.0–40.0)

## 2021-01-06 NOTE — Progress Notes (Signed)
Patient ID: Mindy Young, female   DOB: 1945/03/27, 75 y.o.   MRN: [id number] Patient presented for her initial CCA to enter into CD-IOP. Pt was IVC to [facility] about 10 days ago. She was referred to [facility] OP CD-IOP program. She has a caretaker who was present the whole time as clinician started CCA. Clinician immediately realized patient needed detox. She drinks up to 12 shots of liquor daily, has health issues, falls quite often. Pt reports she wants to stop drinking due to her health issues and friends/family issues. When Clinician mentioned detox patient immediately started to cry. Clinician spoke with pt and caretaker about the necessity of detox due safety concerns. Pt and caretaker admitted pt had several seizures when she left [place] and had stopped drinking for 2 days. Cln reached out to R. Carlis Abbott, case Freight forwarder at [facility] OP who suggested referring the patient to [facility] for detox. CD-IOP program is an abstinence- only program and since she is still continuing to drink she would need to be referred to detox. Cln spoke to S. Lovena Le, RN, at [facility] who confirmed sending her to [facility] hospital for detox due to safety concerns. Again, cln spoke to pt and caretaker explaining the need for detox suggesting caretaker take her today to the ED.  Cln also suggested she speak with a Education officer, museum while pt is in detox to begin transition to [facility] OP CD-IOP program. Caretaker was allowed to ask questions. Pt stopped crying and became agitated because of the referral to detox.   Alver Fisher, MS, LCAS

## 2021-01-07 ENCOUNTER — Telehealth: Payer: Self-pay | Admitting: Physician Assistant

## 2021-01-07 NOTE — Telephone Encounter (Signed)
Patient called to check on the status of her lab results from   01/01/21.

## 2021-01-07 NOTE — Telephone Encounter (Signed)
No answer at 321 01/07/2021.

## 2021-01-08 NOTE — Telephone Encounter (Signed)
No answer again at 9:07am

## 2021-01-08 NOTE — Telephone Encounter (Signed)
Pt called in returning Christy's call

## 2021-01-08 NOTE — Telephone Encounter (Signed)
Message left, multiple message to call office left. Keppra level is normal

## 2021-01-25 ENCOUNTER — Ambulatory Visit: Payer: Medicare Other | Admitting: Neurology

## 2021-02-19 ENCOUNTER — Emergency Department (HOSPITAL_BASED_OUTPATIENT_CLINIC_OR_DEPARTMENT_OTHER): Payer: Medicare Other

## 2021-02-19 ENCOUNTER — Encounter (HOSPITAL_BASED_OUTPATIENT_CLINIC_OR_DEPARTMENT_OTHER): Payer: Self-pay | Admitting: Emergency Medicine

## 2021-02-19 ENCOUNTER — Other Ambulatory Visit: Payer: Self-pay

## 2021-02-19 ENCOUNTER — Inpatient Hospital Stay (HOSPITAL_BASED_OUTPATIENT_CLINIC_OR_DEPARTMENT_OTHER)
Admission: EM | Admit: 2021-02-19 | Discharge: 2021-02-23 | DRG: 982 | Disposition: A | Discharge status: Home Health | Payer: Medicare Other | Attending: Internal Medicine | Admitting: Internal Medicine

## 2021-02-19 DIAGNOSIS — Z9049 Acquired absence of other specified parts of digestive tract: Secondary | ICD-10-CM

## 2021-02-19 DIAGNOSIS — F1020 Alcohol dependence, uncomplicated: Secondary | ICD-10-CM | POA: Diagnosis present

## 2021-02-19 DIAGNOSIS — J449 Chronic obstructive pulmonary disease, unspecified: Secondary | ICD-10-CM | POA: Diagnosis present

## 2021-02-19 DIAGNOSIS — R9431 Abnormal electrocardiogram [ECG] [EKG]: Secondary | ICD-10-CM

## 2021-02-19 DIAGNOSIS — Z85820 Personal history of malignant melanoma of skin: Secondary | ICD-10-CM

## 2021-02-19 DIAGNOSIS — D491 Neoplasm of unspecified behavior of respiratory system: Secondary | ICD-10-CM | POA: Diagnosis present

## 2021-02-19 DIAGNOSIS — I700 Atherosclerosis of aorta: Secondary | ICD-10-CM | POA: Diagnosis present

## 2021-02-19 DIAGNOSIS — F10239 Alcohol dependence with withdrawal, unspecified: Secondary | ICD-10-CM | POA: Diagnosis not present

## 2021-02-19 DIAGNOSIS — E876 Hypokalemia: Secondary | ICD-10-CM

## 2021-02-19 DIAGNOSIS — D539 Nutritional anemia, unspecified: Secondary | ICD-10-CM | POA: Diagnosis present

## 2021-02-19 DIAGNOSIS — R7989 Other specified abnormal findings of blood chemistry: Secondary | ICD-10-CM

## 2021-02-19 DIAGNOSIS — Z8249 Family history of ischemic heart disease and other diseases of the circulatory system: Secondary | ICD-10-CM

## 2021-02-19 DIAGNOSIS — F419 Anxiety disorder, unspecified: Secondary | ICD-10-CM | POA: Diagnosis present

## 2021-02-19 DIAGNOSIS — Z9882 Breast implant status: Secondary | ICD-10-CM

## 2021-02-19 DIAGNOSIS — J9811 Atelectasis: Secondary | ICD-10-CM | POA: Diagnosis present

## 2021-02-19 DIAGNOSIS — R197 Diarrhea, unspecified: Secondary | ICD-10-CM

## 2021-02-19 DIAGNOSIS — K219 Gastro-esophageal reflux disease without esophagitis: Secondary | ICD-10-CM | POA: Diagnosis present

## 2021-02-19 DIAGNOSIS — K529 Noninfective gastroenteritis and colitis, unspecified: Secondary | ICD-10-CM | POA: Diagnosis present

## 2021-02-19 DIAGNOSIS — Z90710 Acquired absence of both cervix and uterus: Secondary | ICD-10-CM

## 2021-02-19 DIAGNOSIS — Z20822 Contact with and (suspected) exposure to covid-19: Secondary | ICD-10-CM | POA: Diagnosis present

## 2021-02-19 DIAGNOSIS — E663 Overweight: Secondary | ICD-10-CM | POA: Diagnosis present

## 2021-02-19 DIAGNOSIS — E872 Acidosis, unspecified: Secondary | ICD-10-CM | POA: Diagnosis present

## 2021-02-19 DIAGNOSIS — E860 Dehydration: Principal | ICD-10-CM | POA: Diagnosis present

## 2021-02-19 DIAGNOSIS — Z7951 Long term (current) use of inhaled steroids: Secondary | ICD-10-CM

## 2021-02-19 DIAGNOSIS — Z885 Allergy status to narcotic agent status: Secondary | ICD-10-CM

## 2021-02-19 DIAGNOSIS — I119 Hypertensive heart disease without heart failure: Secondary | ICD-10-CM | POA: Diagnosis present

## 2021-02-19 DIAGNOSIS — E8809 Other disorders of plasma-protein metabolism, not elsewhere classified: Secondary | ICD-10-CM | POA: Diagnosis present

## 2021-02-19 DIAGNOSIS — M1990 Unspecified osteoarthritis, unspecified site: Secondary | ICD-10-CM | POA: Diagnosis present

## 2021-02-19 DIAGNOSIS — Z803 Family history of malignant neoplasm of breast: Secondary | ICD-10-CM

## 2021-02-19 DIAGNOSIS — T17890A Other foreign object in other parts of respiratory tract causing asphyxiation, initial encounter: Secondary | ICD-10-CM | POA: Diagnosis present

## 2021-02-19 DIAGNOSIS — I10 Essential (primary) hypertension: Secondary | ICD-10-CM | POA: Diagnosis present

## 2021-02-19 DIAGNOSIS — R778 Other specified abnormalities of plasma proteins: Secondary | ICD-10-CM | POA: Diagnosis present

## 2021-02-19 DIAGNOSIS — Z853 Personal history of malignant neoplasm of breast: Secondary | ICD-10-CM

## 2021-02-19 DIAGNOSIS — F17210 Nicotine dependence, cigarettes, uncomplicated: Secondary | ICD-10-CM | POA: Diagnosis present

## 2021-02-19 DIAGNOSIS — E871 Hypo-osmolality and hyponatremia: Secondary | ICD-10-CM | POA: Diagnosis present

## 2021-02-19 DIAGNOSIS — Z716 Tobacco abuse counseling: Secondary | ICD-10-CM

## 2021-02-19 DIAGNOSIS — F32A Depression, unspecified: Secondary | ICD-10-CM | POA: Diagnosis present

## 2021-02-19 DIAGNOSIS — Z79899 Other long term (current) drug therapy: Secondary | ICD-10-CM

## 2021-02-19 LAB — CBC WITH DIFFERENTIAL/PLATELET
Abs Immature Granulocytes: 0.05 10*3/uL (ref 0.00–0.07)
Basophils Absolute: 0 10*3/uL (ref 0.0–0.1)
Basophils Relative: 0 %
Eosinophils Absolute: 0 10*3/uL (ref 0.0–0.5)
Eosinophils Relative: 0 %
HCT: 35.9 % — ABNORMAL LOW (ref 36.0–46.0)
Hemoglobin: 12.7 g/dL (ref 12.0–15.0)
Immature Granulocytes: 1 %
Lymphocytes Relative: 14 %
Lymphs Abs: 1.2 10*3/uL (ref 0.7–4.0)
MCH: 35.8 pg — ABNORMAL HIGH (ref 26.0–34.0)
MCHC: 35.4 g/dL (ref 30.0–36.0)
MCV: 101.1 fL — ABNORMAL HIGH (ref 80.0–100.0)
Monocytes Absolute: 0.8 10*3/uL (ref 0.1–1.0)
Monocytes Relative: 10 %
Neutro Abs: 6.3 10*3/uL (ref 1.7–7.7)
Neutrophils Relative %: 75 %
Platelets: 223 10*3/uL (ref 150–400)
RBC: 3.55 MIL/uL — ABNORMAL LOW (ref 3.87–5.11)
RDW: 12.7 % (ref 11.5–15.5)
WBC: 8.5 10*3/uL (ref 4.0–10.5)
nRBC: 0 % (ref 0.0–0.2)

## 2021-02-19 LAB — MAGNESIUM: Magnesium: 0.9 mg/dL — CL (ref 1.7–2.4)

## 2021-02-19 LAB — COMPREHENSIVE METABOLIC PANEL
ALT: 35 U/L (ref 0–44)
AST: 57 U/L — ABNORMAL HIGH (ref 15–41)
Albumin: 3.4 g/dL — ABNORMAL LOW (ref 3.5–5.0)
Alkaline Phosphatase: 71 U/L (ref 38–126)
Anion gap: 14 (ref 5–15)
BUN: 16 mg/dL (ref 8–23)
CO2: 25 mmol/L (ref 22–32)
Calcium: 8.4 mg/dL — ABNORMAL LOW (ref 8.9–10.3)
Chloride: 93 mmol/L — ABNORMAL LOW (ref 98–111)
Creatinine, Ser: 1 mg/dL (ref 0.44–1.00)
GFR, Estimated: 59 mL/min — ABNORMAL LOW (ref >60)
Glucose, Bld: 148 mg/dL — ABNORMAL HIGH (ref 70–99)
Potassium: 3 mmol/L — ABNORMAL LOW (ref 3.5–5.1)
Sodium: 132 mmol/L — ABNORMAL LOW (ref 135–145)
Total Bilirubin: 0.7 mg/dL (ref 0.3–1.2)
Total Protein: 5.9 g/dL — ABNORMAL LOW (ref 6.5–8.1)

## 2021-02-19 LAB — TROPONIN I (HIGH SENSITIVITY)
Troponin I (High Sensitivity): 433 ng/L (ref <18)
Troponin I (High Sensitivity): 358 ng/L (ref <18)

## 2021-02-19 LAB — D-DIMER, QUANTITATIVE: D-Dimer, Quant: 1.13 ug/mL-FEU — ABNORMAL HIGH (ref 0.00–0.50)

## 2021-02-19 LAB — C DIFFICILE QUICK SCREEN W PCR REFLEX
C Diff antigen: NEGATIVE
C Diff interpretation: NOT DETECTED
C Diff toxin: NEGATIVE

## 2021-02-19 LAB — LACTIC ACID, PLASMA
Lactic Acid, Venous: 3.9 mmol/L (ref 0.5–1.9)
Lactic Acid, Venous: 1.3 mmol/L (ref 0.5–1.9)

## 2021-02-19 LAB — PROTIME-INR
INR: 0.9 (ref 0.8–1.2)
Prothrombin Time: 11.9 seconds (ref 11.4–15.2)

## 2021-02-19 LAB — RESP PANEL BY RT-PCR (FLU A&B, COVID) ARPGX2
Influenza A by PCR: NEGATIVE
Influenza B by PCR: NEGATIVE
SARS Coronavirus 2 by RT PCR: NEGATIVE

## 2021-02-19 LAB — ETHANOL: Alcohol, Ethyl (B): <10 mg/dL (ref <10)

## 2021-02-19 MED ORDER — ASPIRIN 325 MG PO TABS
325.0000 mg | ORAL_TABLET | Freq: Every day | ORAL | Status: DC
  Administered 2021-02-19 – 2021-02-20 (×2): 325 mg via ORAL
  Filled 2021-02-19 (×2): qty 1

## 2021-02-19 MED ORDER — SODIUM CHLORIDE 0.9 % IV BOLUS: 1000.0000 mL | Freq: Once | INTRAVENOUS | Status: DC

## 2021-02-19 MED ORDER — POTASSIUM CHLORIDE 10 MEQ/100ML IV SOLN
10.0000 meq | Freq: Once | INTRAVENOUS | Status: AC
  Administered 2021-02-19: 23:00:00 10 meq via INTRAVENOUS
  Filled 2021-02-19: qty 100

## 2021-02-19 MED ORDER — MAGNESIUM SULFATE 2 GM/50ML IV SOLN
2.0000 g | Freq: Once | INTRAVENOUS | Status: AC
  Administered 2021-02-19: 17:00:00 2 g via INTRAVENOUS
  Filled 2021-02-19: qty 50

## 2021-02-19 MED ORDER — IOHEXOL 350 MG/ML SOLN
100.0000 mL | Freq: Once | INTRAVENOUS | Status: AC | PRN
  Administered 2021-02-19: 20:00:00 100 mL via INTRAVENOUS

## 2021-02-19 MED ORDER — LACTATED RINGERS IV BOLUS
1000.0000 mL | Freq: Once | INTRAVENOUS | Status: AC
  Administered 2021-02-19: 16:00:00 1000 mL via INTRAVENOUS

## 2021-02-19 MED ORDER — SODIUM CHLORIDE 0.9 % IV SOLN: Freq: Once | INTRAVENOUS | Status: AC

## 2021-02-19 MED ORDER — LACTATED RINGERS IV BOLUS
1000.0000 mL | Freq: Once | INTRAVENOUS | Status: AC
  Administered 2021-02-19: 17:00:00 1000 mL via INTRAVENOUS

## 2021-02-19 MED ORDER — POTASSIUM CHLORIDE CRYS ER 20 MEQ PO TBCR
40.0000 meq | EXTENDED_RELEASE_TABLET | Freq: Once | ORAL | Status: AC
  Administered 2021-02-19: 17:00:00 40 meq via ORAL
  Filled 2021-02-19: qty 2

## 2021-02-19 MED ORDER — POTASSIUM CHLORIDE 10 MEQ/100ML IV SOLN
10.0000 meq | INTRAVENOUS | Status: AC
  Administered 2021-02-19 (×2): 10 meq via INTRAVENOUS
  Filled 2021-02-19 (×2): qty 100

## 2021-02-19 NOTE — ED Notes (Signed)
ED Provider at bedside.  Dr. Pearline Cables using ultrasound to attempt to find additional IV site.

## 2021-02-19 NOTE — ED Notes (Signed)
Pt. Attempted to void on BSC, no results

## 2021-02-19 NOTE — ED Notes (Signed)
Patient transported to CT 

## 2021-02-19 NOTE — ED Provider Notes (Signed)
Ultrasound ED Peripheral IV (Provider)  Date/Time: 02/19/2021 7:19 PM Performed by: Jeanell Sparrow, DO Authorized by: Jeanell Sparrow, DO   Procedure details:    Indications: hydration, multiple failed IV attempts and poor IV access     Skin Prep: chlorhexidine gluconate     Location:  Left AC   Angiocath:  20 G   Bedside Ultrasound Guided: Yes     Images: not archived     Patient tolerated procedure without complications: Yes     Dressing applied: Yes      Jeanell Sparrow, DO 02/19/21 1919

## 2021-02-19 NOTE — ED Provider Notes (Signed)
[facility] EMERGENCY DEPARTMENT Provider Note   CSN: [id number] Arrival date & time: 02/19/21  1251     History Chief Complaint  Patient presents with   Diarrhea   Weakness    Mindy Young is a 75 y.o. female with a history of alcohol use disorder, hypertension, GERD, depression, anxiety, seizures (on Keppra).  Presents to the emergency department with a chief complaint of diarrhea, generalized weakness, and lightheadedness.  States that her diarrhea has been present over the last 2 weeks.  Patient states that she is having 4-5 bowel movements daily.  Denies any blood in stool, mucus in stool, or melena.  Denies any recent antibiotic use or travel.  Reports that she has had decreased p.o. intake over the last 2 weeks as well.  Patient states that she has been lightheaded over the last "few days," states that lightheadedness is worse when changing positions or standing.  Patient denies any chest pain or palpitations with exertion patient also has fatigue and generalized weakness over the last 2 weeks.  Patient endorses decreased urinary output, shortness of breath, and minimally productive cough.  Patient denies any fever, chills, rhinorrhea, nasal congestion, sore throat, chest pain, palpitations, leg swelling or tenderness, abdominal pain, nausea, vomiting, dysuria, hematuria, urinary urgency, vaginal pain, vaginal bleeding, vaginal discharge, numbness, weakness, headache, facial asymmetry, dysarthria  Patient denies any known sick contacts.  States that she has been taking all prescribed medications.  Reports daily EtOH use.  States that she normally drinks 4-5 mixed drinks with 1 shot in each drink daily.  Yesterday evening she had 2 mixed drinks.  Denies any previous hospitalization for alcohol withdrawal.  Denies any seizures.   Diarrhea Associated symptoms: no abdominal pain, no chills, no fever, no headaches and no vomiting   Weakness Associated symptoms: cough,  diarrhea and shortness of breath   Associated symptoms: no abdominal pain, no chest pain, no dizziness, no dysuria, no fever, no frequency, no headaches, no nausea, no seizures, no urgency and no vomiting       Past Medical History:  Diagnosis Date   Alcohol abuse    Allergy    Anemia 06/29/2011   Anxiety    Arthritis    Breast cancer ([place])    right breast   C1 cervical fracture ([place]) 02/1997   Cancer ([place])    melanoma   Depression    Family history of breast cancer    Family history of prostate cancer    GERD (gastroesophageal reflux disease)    Hypertension    Tardive dyskinesia     Patient Active Problem List   Diagnosis Date Noted   Alcohol use disorder, severe, in controlled environment ([place]) 12/29/2020   Alcohol use disorder, severe, dependence ([place]) 12/29/2020   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 ([place]) 12/29/2020   Alcohol dependence with uncomplicated intoxication ([place])    Clavicle fracture 10/29/2020   Rib fracture 10/29/2020   Hypokalemia 06/14/2020   Seizure ([place]) 06/13/2020   Impingement syndrome of left shoulder 03/27/2019   Pain in left shoulder 02/21/2019   Rib pain on right side 02/21/2019   Low back pain 02/07/2019   GERD (gastroesophageal reflux disease) 12/09/2018   Anxiety 12/09/2018   Difficulty speaking 12/09/2018   Leukocytosis 12/09/2018   Seizure-like activity ([place]) 12/09/2018   Pelvic fracture ([place]) 11/25/2018   Pubic bone fracture ([place]) 11/24/2018   Left knee pain 03/29/2018   Unilateral primary osteoarthritis, left knee 03/29/2018   Genetic testing 01/24/2018  Family history of breast cancer    Family history of prostate cancer    Malignant neoplasm of lower-inner quadrant of right breast of female, estrogen receptor positive ([place]) 10/24/2017   Anemia 06/29/2011   Hypertension    Hyponatremia 06/25/2011   Transaminitis 06/25/2011   Alcohol abuse 06/25/2011   Cough 06/25/2011   Cigarette smoker  06/25/2011    Past Surgical History:  Procedure Laterality Date   ABDOMINAL HYSTERECTOMY     ASPIRATION OF ABSCESS Right 11/20/2017   Procedure: ASPIRATION OF RIGHT AXILLARY SEROMA;  Surgeon: Rolm Bookbinder, MD;  Location: [facility];  Service: General;  Laterality: Right;   AUGMENTATION MAMMAPLASTY Bilateral    biateral implants , approx 2015   BREAST LUMPECTOMY Right 11/02/2017   re-ex 11-20-17   BREAST LUMPECTOMY WITH RADIOACTIVE SEED AND SENTINEL LYMPH NODE BIOPSY Right 11/02/2017   Procedure: BREAST LUMPECTOMY WITH RADIOACTIVE SEED AND SENTINEL LYMPH NODE BIOPSY;  Surgeon: Rolm Bookbinder, MD;  Location: [facility];  Service: General;  Laterality: Right;   CHOLECYSTECTOMY     collarbone     INCONTINENCE SURGERY     KNEE SURGERY     removal of cyst, repair of cartiledge   LAPAROSCOPY     for endometriosis   RE-EXCISION OF BREAST LUMPECTOMY Right 11/20/2017   Procedure: RE-EXCISION OF RIGHT BREAST MARGINS;  Surgeon: Rolm Bookbinder, MD;  Location: [facility];  Service: General;  Laterality: Right;   ROTATOR CUFF REPAIR     left     OB History   No obstetric history on file.     Family History  Problem Relation Age of Onset   COPD Mother    Prostate cancer Father 20       seed implant for treatment   Colon polyps Father        'a few'   Breast cancer Sister 60   Breast cancer Maternal Aunt        dx >50   Breast cancer Other 65       bilateral   Colon cancer Neg Hx     Social History   Tobacco Use   Smoking status: Every Day    Packs/day: 1.00    Types: Cigarettes   Smokeless tobacco: Never   Tobacco comments:    e-cigs  Vaping Use   Vaping Use: Former  Substance Use Topics   Alcohol use: Yes    Alcohol/week: 21.0 standard drinks    Types: 21 Standard drinks or equivalent per week    Comment: 2 per weekend day; denies during the week (not consistent with prior hx)   Drug use: No    Home  Medications Prior to Admission medications   Medication Sig Start Date End Date Taking? Authorizing Provider  albuterol (VENTOLIN HFA) 108 (90 Base) MCG/ACT inhaler Inhale 1-2 puffs into the lungs every 4 (four) hours as needed for wheezing or shortness of breath. 10/28/20   [provider]  ALPRAZolam Duanne Moron) 0.5 MG tablet Take 0.5 mg by mouth 3 (three) times daily as needed for anxiety. 11/02/20   [provider]  Cyanocobalamin 3000 MCG CAPS Take 3,000 mcg by mouth every morning. Vitamin B12    [provider]  diphenhydrAMINE (BENADRYL) 25 MG tablet Take 50 mg by mouth at bedtime.    [provider]  diphenoxylate-atropine (LOMOTIL) 2.5-0.025 MG tablet Take 1 tablet by mouth 4 (four) times daily as needed for diarrhea or loose stools. 11/12/20   Ellouise Newer  Sula Soda, PA  fluticasone-salmeterol (ADVAIR) 250-50 MCG/ACT AEPB Inhale 1 puff into the lungs 2 (two) times daily as needed (shortness of breath/wheezing).    [provider]  HYDROcodone-acetaminophen (NORCO/VICODIN) 5-325 MG tablet Take 1 tablet by mouth every 6 (six) hours as needed for moderate pain. Patient not taking: Reported on 01/01/2021 12/01/20   Garald Balding, MD  levETIRAcetam (KEPPRA) 1000 MG tablet Take 1 tablet (1,000 mg total) by mouth 2 (two) times daily. Patient taking differently: Take 1,000 mg by mouth every 12 (twelve) hours. 06/15/20   Aline August, MD  lisinopril (ZESTRIL) 5 MG tablet Take 5 mg by mouth every morning. 09/04/20   [provider]  mirtazapine (REMERON) 15 MG tablet Take 15 mg by mouth at bedtime. 07/15/20   [provider]  ondansetron (ZOFRAN) 4 MG tablet Take 4 mg by mouth every 8 (eight) hours as needed for nausea or vomiting.    [provider]  OVER THE COUNTER MEDICATION Take 2 tablets by mouth at bedtime. Sleep 3: GABA 100 mg, Ashwagandha 60 mg, melatonin 10 mg    [provider]  oxybutynin (DITROPAN-XL) 10 MG 24  hr tablet Take 10 mg by mouth every morning. 12/05/16   [provider]  pantoprazole (PROTONIX) 40 MG tablet Take 40 mg by mouth every morning. Patient not taking: Reported on 01/01/2021    [provider]  vortioxetine HBr (TRINTELLIX) 5 MG TABS tablet Take 5 mg by mouth every morning.    [provider]    Allergies    Percocet [oxycodone-acetaminophen]  Review of Systems   Review of Systems  Constitutional:  Positive for appetite change (decreased) and fatigue. Negative for chills and fever.  HENT:  Negative for congestion, rhinorrhea and sore throat.   Eyes:  Negative for visual disturbance.  Respiratory:  Positive for cough and shortness of breath.   Cardiovascular:  Negative for chest pain, palpitations and leg swelling.  Gastrointestinal:  Positive for diarrhea. Negative for abdominal pain, nausea and vomiting.  Genitourinary:  Positive for decreased urine volume. Negative for difficulty urinating, dysuria, frequency, hematuria, pelvic pain, urgency, vaginal bleeding, vaginal discharge and vaginal pain.  Musculoskeletal:  Negative for back pain and neck pain.  Skin:  Negative for color change and rash.  Neurological:  Positive for weakness and light-headedness. Negative for dizziness, seizures, syncope, facial asymmetry, speech difficulty, numbness and headaches.  Psychiatric/Behavioral:  Negative for confusion.    Physical Exam Updated Vital Signs BP (!) 92/54 (BP Location: Left Arm)    Pulse (!) 124    Temp (!) 97.5 F (36.4 C) (Oral)    Resp 20    Ht 5\' 2"  (1.575 m)    Wt 61.2 kg    SpO2 93%    BMI 24.69 kg/m   Physical Exam Vitals and nursing note reviewed.  Constitutional:      General: She is not in acute distress.    Appearance: She is not ill-appearing, toxic-appearing or diaphoretic.  HENT:     Head: Normocephalic.  Eyes:     General: No scleral icterus.       Right eye: No discharge.        Left eye: No discharge.  Cardiovascular:      Rate and Rhythm: Normal rate.  Pulmonary:     Effort: Pulmonary effort is normal. No tachypnea, bradypnea or respiratory distress.     Breath sounds: No stridor. No wheezing or rales.     Comments: Coarse lung sounds throughout  left lung fields Abdominal:     General: Abdomen is flat. A surgical scar is present. Bowel sounds are increased. There is no distension. There are no signs of injury.     Palpations: Abdomen is soft. There is no mass or pulsatile mass.     Tenderness: There is no abdominal tenderness. There is no right CVA tenderness, left CVA tenderness, guarding or rebound.     Hernia: No hernia is present. There is no hernia in the umbilical area or ventral area.  Musculoskeletal:     Right lower leg: No swelling, deformity, lacerations, tenderness or bony tenderness. No edema.     Left lower leg: No swelling, deformity, lacerations, tenderness or bony tenderness. No edema.  Skin:    General: Skin is warm and dry.  Neurological:     General: No focal deficit present.     Mental Status: She is alert.     GCS: GCS eye subscore is 4. GCS verbal subscore is 5. GCS motor subscore is 6.     Motor: No tremor.     Comments: No facial asymmetry or dysarthria.  Patient moves all limbs equally without difficulty  Psychiatric:        Behavior: Behavior is cooperative.    ED Results / Procedures / Treatments   Labs (all labs ordered are listed, but only abnormal results are displayed) Labs Reviewed  COMPREHENSIVE METABOLIC PANEL - Abnormal; Notable for the following components:      Result Value   Sodium 132 (*)    Potassium 3.0 (*)    Chloride 93 (*)    Glucose, Bld 148 (*)    Calcium 8.4 (*)    Total Protein 5.9 (*)    Albumin 3.4 (*)    AST 57 (*)    GFR, Estimated 59 (*)    All other components within normal limits  LACTIC ACID, PLASMA - Abnormal; Notable for the following components:   Lactic Acid, Venous 3.9 (*)    All other components within normal limits  CBC  WITH DIFFERENTIAL/PLATELET - Abnormal; Notable for the following components:   RBC 3.55 (*)    HCT 35.9 (*)    MCV 101.1 (*)    MCH 35.8 (*)    All other components within normal limits  MAGNESIUM - Abnormal; Notable for the following components:   Magnesium 0.9 (*)    All other components within normal limits  TROPONIN I (HIGH SENSITIVITY) - Abnormal; Notable for the following components:   Troponin I (High Sensitivity) 433 (*)    All other components within normal limits  RESP PANEL BY RT-PCR (FLU A&B, COVID) ARPGX2  CULTURE, BLOOD (ROUTINE X 2)  CULTURE, BLOOD (ROUTINE X 2)  GASTROINTESTINAL PANEL BY PCR, STOOL (REPLACES STOOL CULTURE)  C DIFFICILE QUICK SCREEN W PCR REFLEX    PROTIME-INR  ETHANOL  LACTIC ACID, PLASMA  URINALYSIS, ROUTINE W REFLEX MICROSCOPIC  RAPID URINE DRUG SCREEN, HOSP PERFORMED  LEVETIRACETAM LEVEL  D-DIMER, QUANTITATIVE  TROPONIN I (HIGH SENSITIVITY)    EKG EKG Interpretation  Date/Time:  Friday February 19 2021 14:12:47 EST Ventricular Rate:  115 PR Interval:  135 QRS Duration: 75 QT Interval:  367 QTC Calculation: 508 R Axis:   240 Text Interpretation: Sinus tachycardia Atrial premature complex Left anterior fascicular block Anteroseptal infarct, age indeterminate Lateral leads are also involved Prolonged QT interval new t wave changes compared with prior 11/22 Confirmed by Aletta Edouard [phone]) on 02/19/2021 2:17:08 PM  Radiology DG Chest Portable 1  View  Result Date: 02/19/2021 CLINICAL DATA:  Weakness. EXAM: PORTABLE CHEST 1 VIEW COMPARISON:  December 26, 2020. FINDINGS: The heart size and mediastinal contours are within normal limits. Both lungs are clear. The visualized skeletal structures are unremarkable. IMPRESSION: No active disease. Electronically Signed   By: Marijo Conception M.D.   On: 02/19/2021 14:58    Procedures .Critical Care Performed by: Loni Beckwith, PA-C Authorized by: Loni Beckwith, PA-C   Critical care  provider statement:    Critical care time (minutes):  75   Critical care was necessary to treat or prevent imminent or life-threatening deterioration of the following conditions:  Dehydration and metabolic crisis   Critical care was time spent personally by me on the following activities:  Development of treatment plan with patient or surrogate, evaluation of patient's response to treatment, examination of patient, ordering and review of laboratory studies, ordering and review of radiographic studies, ordering and performing treatments and interventions, pulse oximetry, re-evaluation of patient's condition, review of old charts and obtaining history from patient or surrogate   Medications Ordered in ED Medications  magnesium sulfate IVPB 2 g 50 mL (2 g Intravenous New Bag/Given 02/19/21 1703)  potassium chloride 10 mEq in 100 mL IVPB (has no administration in time range)  aspirin tablet 325 mg (has no administration in time range)  lactated ringers bolus 1,000 mL ( Intravenous Stopped 02/19/21 1710)  lactated ringers bolus 1,000 mL (1,000 mLs Intravenous New Bag/Given 02/19/21 1710)  potassium chloride SA (KLOR-CON M) CR tablet 40 mEq (40 mEq Oral Given 02/19/21 1639)    ED Course  I have reviewed the triage vital signs and the nursing notes.  Pertinent labs & imaging results that were available during my care of the patient were reviewed by me and considered in my medical decision making (see chart for details).    MDM Rules/Calculators/A&P                          Alert 75 year old female no acute stress, nontoxic-appearing.  Presents to ED with chief complaint of diarrhea, generalized weakness, and lightheadedness.  Patient has significant past medical history for seizures(on Keppra), hypertension, alcohol use disorder.  Patient denies any blood in stool, melena, or weakness in stool.  Denies any recent travel or antibiotic use.  Per chart review patient was last prescribed Keflex for  urinary tract infection on 11/22  Initial vital signs show temperature of 97.5 F orally, pulse rate of 134, and blood pressure 92/54.  ED sepsis undifferentiated presentation ordered while patient was in triage.  We will add rectal temperature.  Due to patient's lightheadedness worsening with changing positions and standing as well as reported decreased p.o. intake suspect dehydration.  We will give patient 1 L fluid bolus with lactated Ringer's.  We will check orthostatic vital signs.  CMP shows hypokalemia at 3.0.  Magnesium ordered shows hypomagnesia at 0.9.  Patient given repletion with 2 g magnesium and potassium x3 as well as p.o.  Patient lactic acid elevated at 3.9.  Troponin elevated at 433.  Due to elevated troponin patient was given aspirin.  Second troponin trending downwards.  Suspect this is due to demand ischemia secondary to patient's dehydration.  Patient has no leukocytosis.  Chest x-ray shows no active cardiopulmonary disease.  Urinalysis pending at this time.  Will suspicion for C. difficile as patient's only recent antibiotic was Keflex.  GI panel and C. difficile pending at this time.  We will hold antibiotics at this time.  Abdomen soft, nondistended, nontender.  We will hold any CT abdomen pelvis at this time.   Due to patient's electrolyte dysfunction we will consult hospitalist for admission.  Blood pressure improving with fluid bolus.  Due to patient's report of shortness of breath and persistent tachycardia will obtain D-dimer to evaluate for possible PE.  D-dimer elevated, CTA ordered at this time and pending.    Attending physician Dr. Pearline Cables spoke with hospitalist regarding admission.  Patient was accepted for admission and will be transferred to accepting facility.  Patient care discussed with setting patient Dr. Pearline Cables.     Final Clinical Impression(s) / ED Diagnoses Final diagnoses:  Hypomagnesemia  Hypokalemia  Elevated troponin    Rx / DC Orders ED  Discharge Orders     None        Loni Beckwith, PA-C 02/20/21 0105    Jeanell Sparrow, DO 02/20/21 1435

## 2021-02-19 NOTE — ED Notes (Signed)
Patient in and out cath performed.  Only several drops of urine obtained.  Inadequate sample for urinalysis or UDS.  Patient notified urine specimen still needed.

## 2021-02-19 NOTE — Progress Notes (Signed)
Patient: Mindy Young, Mindy Young  DOB: 04/14/1945 RRN:[id number] Transferring facility: [facility] Requesting provider: Dr Pearline Cables (EDP at [facility]) Reason for transfer: admission for further evaluation and management of hypokalemia/hypomagnesemia   74 year old female with history of breast cancer who presented to [facility] ED complaining of 4 to 5 days of diarrhea, in which she has been experiencing at least 5 nonbloody episodes of loose stool per day.  Associated with diminished oral intake and lightheadedness, but no falls, syncope, chest pain, shortness of breath.   Vital signs in the ED were notable for the following: Initial sinus tachycardia, with heart rates in the 120s, improving following 2 L LR along with initial systolic blood pressures in the 90s mmHg, improving with IVF's, most recent 112/86.   Labs were notable for potassium 3.0, creatinine 1.0 (most recent prior 0.61).  Magnesium 0.9.  Initial lactate 3.9.  Repeat lactate result pending.  C. difficile pending. COVID/Flu negative.   Initial troponin was 433, with repeat trending down to 358.  No chest pain/shortness of breath.  EKG reportedly without acute ischemic changes.  Chest x-ray clear.  However, in the setting of breast cancer, D-dimer of 1.13, CTA chest has been ordered, with result currently pending.  Subsequently, I accepted this patient for transfer for observation to med-tele at [facility] for further work-up and management of multiple electrolyte abnormalities, including hypokalemia, hypomagnesemia in the setting of recent diarrhea.    Babs Bertin, DO Hospitalist

## 2021-02-19 NOTE — ED Triage Notes (Signed)
Pt having diarrhea x 2 weeks.  Watery diarrhea.  Some light headedness.  No known fever.  No exposure to illness.

## 2021-02-20 ENCOUNTER — Encounter (HOSPITAL_COMMUNITY): Payer: Self-pay | Admitting: Internal Medicine

## 2021-02-20 ENCOUNTER — Other Ambulatory Visit (HOSPITAL_COMMUNITY): Payer: Medicare Other

## 2021-02-20 DIAGNOSIS — I361 Nonrheumatic tricuspid (valve) insufficiency: Secondary | ICD-10-CM | POA: Diagnosis not present

## 2021-02-20 DIAGNOSIS — R778 Other specified abnormalities of plasma proteins: Secondary | ICD-10-CM

## 2021-02-20 DIAGNOSIS — Z20822 Contact with and (suspected) exposure to covid-19: Secondary | ICD-10-CM | POA: Diagnosis present

## 2021-02-20 DIAGNOSIS — F10239 Alcohol dependence with withdrawal, unspecified: Secondary | ICD-10-CM | POA: Diagnosis not present

## 2021-02-20 DIAGNOSIS — R197 Diarrhea, unspecified: Secondary | ICD-10-CM

## 2021-02-20 DIAGNOSIS — J9811 Atelectasis: Secondary | ICD-10-CM | POA: Diagnosis present

## 2021-02-20 DIAGNOSIS — D491 Neoplasm of unspecified behavior of respiratory system: Secondary | ICD-10-CM | POA: Diagnosis present

## 2021-02-20 DIAGNOSIS — E871 Hypo-osmolality and hyponatremia: Secondary | ICD-10-CM | POA: Diagnosis present

## 2021-02-20 DIAGNOSIS — T17890A Other foreign object in other parts of respiratory tract causing asphyxiation, initial encounter: Secondary | ICD-10-CM | POA: Diagnosis present

## 2021-02-20 DIAGNOSIS — J449 Chronic obstructive pulmonary disease, unspecified: Secondary | ICD-10-CM | POA: Diagnosis present

## 2021-02-20 DIAGNOSIS — M1990 Unspecified osteoarthritis, unspecified site: Secondary | ICD-10-CM | POA: Diagnosis present

## 2021-02-20 DIAGNOSIS — E860 Dehydration: Secondary | ICD-10-CM | POA: Diagnosis present

## 2021-02-20 DIAGNOSIS — E876 Hypokalemia: Secondary | ICD-10-CM

## 2021-02-20 DIAGNOSIS — I700 Atherosclerosis of aorta: Secondary | ICD-10-CM | POA: Diagnosis present

## 2021-02-20 DIAGNOSIS — K529 Noninfective gastroenteritis and colitis, unspecified: Secondary | ICD-10-CM | POA: Diagnosis present

## 2021-02-20 DIAGNOSIS — E872 Acidosis, unspecified: Secondary | ICD-10-CM | POA: Diagnosis present

## 2021-02-20 DIAGNOSIS — R9431 Abnormal electrocardiogram [ECG] [EKG]: Secondary | ICD-10-CM

## 2021-02-20 DIAGNOSIS — I119 Hypertensive heart disease without heart failure: Secondary | ICD-10-CM | POA: Diagnosis present

## 2021-02-20 DIAGNOSIS — F17210 Nicotine dependence, cigarettes, uncomplicated: Secondary | ICD-10-CM | POA: Diagnosis present

## 2021-02-20 DIAGNOSIS — F419 Anxiety disorder, unspecified: Secondary | ICD-10-CM | POA: Diagnosis present

## 2021-02-20 DIAGNOSIS — E8809 Other disorders of plasma-protein metabolism, not elsewhere classified: Secondary | ICD-10-CM | POA: Diagnosis present

## 2021-02-20 DIAGNOSIS — K219 Gastro-esophageal reflux disease without esophagitis: Secondary | ICD-10-CM | POA: Diagnosis present

## 2021-02-20 DIAGNOSIS — F32A Depression, unspecified: Secondary | ICD-10-CM | POA: Diagnosis present

## 2021-02-20 DIAGNOSIS — Z8249 Family history of ischemic heart disease and other diseases of the circulatory system: Secondary | ICD-10-CM | POA: Diagnosis not present

## 2021-02-20 DIAGNOSIS — D539 Nutritional anemia, unspecified: Secondary | ICD-10-CM | POA: Diagnosis present

## 2021-02-20 DIAGNOSIS — E663 Overweight: Secondary | ICD-10-CM | POA: Diagnosis present

## 2021-02-20 LAB — GASTROINTESTINAL PANEL BY PCR, STOOL (REPLACES STOOL CULTURE)

## 2021-02-20 LAB — CBC
HCT: 29.8 % — ABNORMAL LOW (ref 36.0–46.0)
Hemoglobin: 10.1 g/dL — ABNORMAL LOW (ref 12.0–15.0)
MCH: 34.9 pg — ABNORMAL HIGH (ref 26.0–34.0)
MCHC: 33.9 g/dL (ref 30.0–36.0)
MCV: 103.1 fL — ABNORMAL HIGH (ref 80.0–100.0)
Platelets: 186 10*3/uL (ref 150–400)
RBC: 2.89 MIL/uL — ABNORMAL LOW (ref 3.87–5.11)
RDW: 12.9 % (ref 11.5–15.5)
WBC: 7 10*3/uL (ref 4.0–10.5)
nRBC: 0 % (ref 0.0–0.2)

## 2021-02-20 LAB — COMPREHENSIVE METABOLIC PANEL
ALT: 28 U/L (ref 0–44)
AST: 41 U/L (ref 15–41)
Albumin: 2.6 g/dL — ABNORMAL LOW (ref 3.5–5.0)
Alkaline Phosphatase: 63 U/L (ref 38–126)
Anion gap: 9 (ref 5–15)
BUN: 12 mg/dL (ref 8–23)
CO2: 25 mmol/L (ref 22–32)
Calcium: 8 mg/dL — ABNORMAL LOW (ref 8.9–10.3)
Chloride: 100 mmol/L (ref 98–111)
Creatinine, Ser: 0.82 mg/dL (ref 0.44–1.00)
GFR, Estimated: >60 mL/min (ref >60)
Glucose, Bld: 106 mg/dL — ABNORMAL HIGH (ref 70–99)
Potassium: 4.3 mmol/L (ref 3.5–5.1)
Sodium: 134 mmol/L — ABNORMAL LOW (ref 135–145)
Total Bilirubin: 0.7 mg/dL (ref 0.3–1.2)
Total Protein: 4.4 g/dL — ABNORMAL LOW (ref 6.5–8.1)

## 2021-02-20 LAB — MAGNESIUM: Magnesium: 2.1 mg/dL (ref 1.7–2.4)

## 2021-02-20 LAB — LIPASE, BLOOD: Lipase: 70 U/L — ABNORMAL HIGH (ref 11–51)

## 2021-02-20 LAB — C-REACTIVE PROTEIN: CRP: 0.5 mg/dL (ref <1.0)

## 2021-02-20 LAB — TROPONIN I (HIGH SENSITIVITY)
Troponin I (High Sensitivity): 208 ng/L (ref <18)
Troponin I (High Sensitivity): 180 ng/L (ref <18)

## 2021-02-20 LAB — SEDIMENTATION RATE: Sed Rate: 8 mm/hr (ref 0–22)

## 2021-02-20 MED ORDER — POTASSIUM CHLORIDE 10 MEQ/100ML IV SOLN
10.0000 meq | INTRAVENOUS | Status: AC
  Administered 2021-02-20 (×2): 10 meq via INTRAVENOUS
  Filled 2021-02-20 (×2): qty 100

## 2021-02-20 MED ORDER — FLUTICASONE FUROATE-VILANTEROL 200-25 MCG/ACT IN AEPB
1.0000 | INHALATION_SPRAY | Freq: Every day | RESPIRATORY_TRACT | Status: DC
  Administered 2021-02-20 – 2021-02-21 (×2): 1 via RESPIRATORY_TRACT
  Filled 2021-02-20: qty 28

## 2021-02-20 MED ORDER — LORAZEPAM 2 MG/ML IJ SOLN: 1.0000 mg | INTRAMUSCULAR | Status: DC | PRN

## 2021-02-20 MED ORDER — ACETAMINOPHEN 325 MG PO TABS: 650.0000 mg | ORAL_TABLET | Freq: Four times a day (QID) | ORAL | Status: DC | PRN

## 2021-02-20 MED ORDER — LACTATED RINGERS IV SOLN
INTRAVENOUS | Status: DC
  Administered 2021-02-21: 1000 mL via INTRAVENOUS

## 2021-02-20 MED ORDER — DIPHENOXYLATE-ATROPINE 2.5-0.025 MG PO TABS: 1.0000 | ORAL_TABLET | Freq: Four times a day (QID) | ORAL | Status: DC | PRN

## 2021-02-20 MED ORDER — ACETAMINOPHEN 650 MG RE SUPP: 650.0000 mg | Freq: Four times a day (QID) | RECTAL | Status: DC | PRN

## 2021-02-20 MED ORDER — LEVETIRACETAM 500 MG PO TABS
1000.0000 mg | ORAL_TABLET | Freq: Two times a day (BID) | ORAL | Status: DC
  Administered 2021-02-20 – 2021-02-23 (×8): 1000 mg via ORAL
  Filled 2021-02-20 (×8): qty 2

## 2021-02-20 MED ORDER — LORAZEPAM 1 MG PO TABS: 1.0000 mg | ORAL_TABLET | ORAL | Status: AC | PRN

## 2021-02-20 MED ORDER — MAGNESIUM SULFATE 2 GM/50ML IV SOLN
2.0000 g | Freq: Once | INTRAVENOUS | Status: AC
Start: 2021-02-20 — End: 2021-02-20
  Administered 2021-02-20: 2 g via INTRAVENOUS
  Filled 2021-02-20: qty 50

## 2021-02-20 MED ORDER — ADULT MULTIVITAMIN W/MINERALS CH
1.0000 | ORAL_TABLET | Freq: Every day | ORAL | Status: DC
  Administered 2021-02-20 – 2021-02-23 (×4): 1 via ORAL
  Filled 2021-02-20 (×4): qty 1

## 2021-02-20 MED ORDER — FOLIC ACID 1 MG PO TABS
1.0000 mg | ORAL_TABLET | Freq: Every day | ORAL | Status: DC
  Administered 2021-02-20 – 2021-02-23 (×4): 1 mg via ORAL
  Filled 2021-02-20 (×4): qty 1

## 2021-02-20 MED ORDER — ENOXAPARIN SODIUM 40 MG/0.4ML IJ SOSY
40.0000 mg | PREFILLED_SYRINGE | Freq: Every day | INTRAMUSCULAR | Status: DC
  Administered 2021-02-20 – 2021-02-23 (×4): 40 mg via SUBCUTANEOUS
  Filled 2021-02-20 (×4): qty 0.4

## 2021-02-20 MED ORDER — LISINOPRIL 5 MG PO TABS
5.0000 mg | ORAL_TABLET | Freq: Every morning | ORAL | Status: DC
  Administered 2021-02-20 – 2021-02-23 (×4): 5 mg via ORAL
  Filled 2021-02-20 (×4): qty 1

## 2021-02-20 MED ORDER — LORAZEPAM 2 MG/ML IJ SOLN
0.0000 mg | INTRAMUSCULAR | Status: AC
  Administered 2021-02-20 (×2): 2 mg via INTRAVENOUS
  Administered 2021-02-21: 1 mg via INTRAVENOUS
  Administered 2021-02-21 (×2): 2 mg via INTRAVENOUS
  Administered 2021-02-21 – 2021-02-22 (×2): 1 mg via INTRAVENOUS
  Administered 2021-02-22: 2 mg via INTRAVENOUS
  Filled 2021-02-20 (×8): qty 1

## 2021-02-20 MED ORDER — LORAZEPAM 2 MG/ML IJ SOLN
1.0000 mg | INTRAMUSCULAR | Status: AC | PRN
  Filled 2021-02-20: qty 1

## 2021-02-20 MED ORDER — THIAMINE HCL 100 MG PO TABS
100.0000 mg | ORAL_TABLET | Freq: Every day | ORAL | Status: DC
  Administered 2021-02-20 – 2021-02-23 (×4): 100 mg via ORAL
  Filled 2021-02-20 (×4): qty 1

## 2021-02-20 MED ORDER — ALBUTEROL SULFATE (2.5 MG/3ML) 0.083% IN NEBU: 2.5000 mg | INHALATION_SOLUTION | RESPIRATORY_TRACT | Status: DC | PRN

## 2021-02-20 MED ORDER — LORAZEPAM 2 MG/ML IJ SOLN
0.0000 mg | Freq: Three times a day (TID) | INTRAMUSCULAR | Status: DC
  Administered 2021-02-22 – 2021-02-23 (×2): 1 mg via INTRAVENOUS
  Filled 2021-02-20 (×2): qty 1

## 2021-02-20 MED ORDER — ALPRAZOLAM 0.5 MG PO TABS
0.5000 mg | ORAL_TABLET | Freq: Three times a day (TID) | ORAL | Status: DC | PRN
  Administered 2021-02-21 – 2021-02-23 (×5): 0.5 mg via ORAL
  Filled 2021-02-20 (×5): qty 1

## 2021-02-20 MED ORDER — NICOTINE 14 MG/24HR TD PT24
14.0000 mg | MEDICATED_PATCH | Freq: Every day | TRANSDERMAL | Status: DC
  Administered 2021-02-20 – 2021-02-23 (×4): 14 mg via TRANSDERMAL
  Filled 2021-02-20 (×4): qty 1

## 2021-02-20 MED ORDER — MELATONIN 5 MG PO TABS
10.0000 mg | ORAL_TABLET | Freq: Every day | ORAL | Status: DC
  Administered 2021-02-20 – 2021-02-22 (×3): 10 mg via ORAL
  Filled 2021-02-20 (×3): qty 2

## 2021-02-20 MED ORDER — THIAMINE HCL 100 MG/ML IJ SOLN
100.0000 mg | Freq: Every day | INTRAMUSCULAR | Status: DC
  Filled 2021-02-20 (×2): qty 2

## 2021-02-20 MED ORDER — LORAZEPAM 1 MG PO TABS
1.0000 mg | ORAL_TABLET | ORAL | Status: DC | PRN
  Administered 2021-02-20: 2 mg via ORAL
  Filled 2021-02-20: qty 2

## 2021-02-20 NOTE — ED Notes (Signed)
Report given to [facility]. 

## 2021-02-20 NOTE — Consult Note (Addendum)
Consultation  Referring Provider: [facility]/ Eliseo Squires DO Primary Care Physician:  Cipriano Mile, NP Primary Gastroenterologist:  Dr. Silverio Decamp  Reason for Consultation:  Diarrhea  HPI: Mindy Young is a 75 y.o. female, known to Dr. Silverio Decamp, who was admitted last evening with several week history of diarrhea, and progressive weakness, "dehydration", lack of appetite and reported significant weight loss per the patient over the past couple of months. She says she has not really had any appetite and has been having to force herself to eat.  She is not a great historian but feels that her diarrhea which has a chronic component has been worse over the past 4 weeks or so.  She was treated with a course of antibiotics by her PCP for urinary tract infection she believes in November.  She also had COVID this fall and says that her diarrhea was significantly worse around that time and then has never completely resolved. Been seen in our office for chronic diarrhea and had been treated with Lomotil on a as needed basis.  Her last colonoscopy was done in 2012 with finding of 1 2 mm hyperplastic polyp and small internal hemorrhoids, no random biopsies done at that time as no diarrhea. She had EGD in 2018 that showed small hiatal hernia and mild gastritis.  Duodenal biopsy showed mild patchy increase in intraepithelial leukocytes could not rule out celiac disease, NSAIDs induced.   Patient has history of chronic EtOH abuse, usually drinks 3 shots each evening to help her sleep, she says she would like to stop drinking.  Last drink was the evening before she presented to the hospital.  She also has history of tardive dyskinesia, breast cancer.  And is status post cholecystectomy. T imaging in May 2002 showed intra and extrahepatic ductal dilation to about 10 cm more in the central ducts showing mild wall thickening.  Her LFTs have been normal.  Patient reports having up to 10 bowel movements per day prior to  admission but says this has varied, stools are liquid, nonbloody.  No associated abdominal pain or cramping no nausea or vomiting but has no appetite, afebrile to her knowledge.  Work-up in the ER with CT angio chest showed a collapsed right middle lobe question mass versus plugging and evidence of COPD, no abdominal imaging done  GI path panel and C. difficile quick screen are both pending, respiratory panel negative EtOH less than 10 INR 0.9 WBC 8.5/hemoglobin 12.7/hematocrit 35.9 MCV 101 Potassium 3.0/creatinine 1.0 Lactate 1.3 Troponin 358> 208 LFTs within normal limits   Past Medical History:  Diagnosis Date   Alcohol abuse    Allergy    Anemia 06/29/2011   Anxiety    Arthritis    Breast cancer ([place])    right breast   C1 cervical fracture ([place]) 02/1997   Cancer ([place])    melanoma   Depression    Family history of breast cancer    Family history of prostate cancer    GERD (gastroesophageal reflux disease)    Hypertension    Tardive dyskinesia     Past Surgical History:  Procedure Laterality Date   ABDOMINAL HYSTERECTOMY     ASPIRATION OF ABSCESS Right 11/20/2017   Procedure: ASPIRATION OF RIGHT AXILLARY SEROMA;  Surgeon: Rolm Bookbinder, MD;  Location: [facility];  Service: General;  Laterality: Right;   AUGMENTATION MAMMAPLASTY Bilateral    biateral implants , approx 2015   BREAST LUMPECTOMY Right 11/02/2017   re-ex 11-20-17   BREAST  LUMPECTOMY WITH RADIOACTIVE SEED AND SENTINEL LYMPH NODE BIOPSY Right 11/02/2017   Procedure: BREAST LUMPECTOMY WITH RADIOACTIVE SEED AND SENTINEL LYMPH NODE BIOPSY;  Surgeon: Rolm Bookbinder, MD;  Location: [facility];  Service: General;  Laterality: Right;   CHOLECYSTECTOMY     collarbone     INCONTINENCE SURGERY     KNEE SURGERY     removal of cyst, repair of cartiledge   LAPAROSCOPY     for endometriosis   RE-EXCISION OF BREAST LUMPECTOMY Right 11/20/2017   Procedure: RE-EXCISION OF RIGHT BREAST  MARGINS;  Surgeon: Rolm Bookbinder, MD;  Location: [facility];  Service: General;  Laterality: Right;   ROTATOR CUFF REPAIR     left    Prior to Admission medications   Medication Sig Start Date End Date Taking? Authorizing Provider  albuterol (VENTOLIN HFA) 108 (90 Base) MCG/ACT inhaler Inhale 1-2 puffs into the lungs every 4 (four) hours as needed for wheezing or shortness of breath. 10/28/20   [provider]  ALPRAZolam Duanne Moron) 0.5 MG tablet Take 0.5 mg by mouth 3 (three) times daily as needed for anxiety. 11/02/20   [provider]  Cyanocobalamin 3000 MCG CAPS Take 3,000 mcg by mouth every morning. Vitamin B12    [provider]  diphenhydrAMINE (BENADRYL) 25 MG tablet Take 50 mg by mouth at bedtime.    [provider]  diphenoxylate-atropine (LOMOTIL) 2.5-0.025 MG tablet Take 1 tablet by mouth 4 (four) times daily as needed for diarrhea or loose stools. 11/12/20   Levin Erp, PA  fluticasone-salmeterol (ADVAIR) 250-50 MCG/ACT AEPB Inhale 1 puff into the lungs 2 (two) times daily as needed (shortness of breath/wheezing).    [provider]  HYDROcodone-acetaminophen (NORCO/VICODIN) 5-325 MG tablet Take 1 tablet by mouth every 6 (six) hours as needed for moderate pain. Patient not taking: Reported on 01/01/2021 12/01/20   Garald Balding, MD  levETIRAcetam (KEPPRA) 1000 MG tablet Take 1 tablet (1,000 mg total) by mouth 2 (two) times daily. Patient taking differently: Take 1,000 mg by mouth every 12 (twelve) hours. 06/15/20   Aline August, MD  lisinopril (ZESTRIL) 5 MG tablet Take 5 mg by mouth every morning. 09/04/20   [provider]  mirtazapine (REMERON) 15 MG tablet Take 15 mg by mouth at bedtime. 07/15/20   [provider]  ondansetron (ZOFRAN) 4 MG tablet Take 4 mg by mouth every 8 (eight) hours as needed for nausea or vomiting.    [provider]  OVER THE COUNTER MEDICATION Take 2  tablets by mouth at bedtime. Sleep 3: GABA 100 mg, Ashwagandha 60 mg, melatonin 10 mg    [provider]  oxybutynin (DITROPAN-XL) 10 MG 24 hr tablet Take 10 mg by mouth every morning. 12/05/16   [provider]  pantoprazole (PROTONIX) 40 MG tablet Take 40 mg by mouth every morning. Patient not taking: Reported on 01/01/2021    [provider]  vortioxetine HBr (TRINTELLIX) 5 MG TABS tablet Take 5 mg by mouth every morning.    [provider]    Current Facility-Administered Medications  Medication Dose Route Frequency Provider Last Rate Last Admin   acetaminophen (TYLENOL) tablet 650 mg  650 mg Oral Q6H PRN Chotiner, Yevonne Aline, MD       Or   acetaminophen (TYLENOL) suppository 650 mg  650 mg Rectal Q6H PRN Chotiner, Yevonne Aline, MD       albuterol (PROVENTIL) (2.5 MG/3ML) 0.083% nebulizer solution 2.5 mg  2.5  mg Inhalation Q4H PRN Chotiner, Yevonne Aline, MD       aspirin tablet 325 mg  325 mg Oral Daily Wynona Dove A, DO   325 mg at 02/20/21 4193   diphenoxylate-atropine (LOMOTIL) 2.5-0.025 MG per tablet 1 tablet  1 tablet Oral QID PRN Chotiner, Yevonne Aline, MD       enoxaparin (LOVENOX) injection 40 mg  40 mg Subcutaneous Daily Chotiner, Yevonne Aline, MD   40 mg at 02/20/21 0838   fluticasone furoate-vilanterol (BREO ELLIPTA) 200-25 MCG/ACT 1 puff  1 puff Inhalation Daily Chotiner, Yevonne Aline, MD   1 puff at 79/02/40 9735   folic acid (FOLVITE) tablet 1 mg  1 mg Oral Daily Chotiner, Yevonne Aline, MD   1 mg at 02/20/21 3299   lactated ringers infusion   Intravenous Continuous Chotiner, Yevonne Aline, MD 125 mL/hr at 02/20/21 0330 Infusion Verify at 02/20/21 0330   levETIRAcetam (KEPPRA) tablet 1,000 mg  1,000 mg Oral Q12H Chotiner, Yevonne Aline, MD   1,000 mg at 02/20/21 2426   lisinopril (ZESTRIL) tablet 5 mg  5 mg Oral q morning Chotiner, Yevonne Aline, MD   5 mg at 02/20/21 8341   LORazepam (ATIVAN) tablet 1-4 mg  1-4 mg Oral Q1H PRN Chotiner, Yevonne Aline, MD       Or    LORazepam (ATIVAN) injection 1-4 mg  1-4 mg Intravenous Q1H PRN Chotiner, Yevonne Aline, MD       multivitamin with minerals tablet 1 tablet  1 tablet Oral Daily Chotiner, Yevonne Aline, MD   1 tablet at 02/20/21 9622   nicotine (NICODERM CQ - dosed in mg/24 hours) patch 14 mg  14 mg Transdermal Daily Chotiner, Yevonne Aline, MD   14 mg at 02/20/21 0211   thiamine tablet 100 mg  100 mg Oral Daily Chotiner, Yevonne Aline, MD   100 mg at 02/20/21 2979   Or   thiamine (B-1) injection 100 mg  100 mg Intravenous Daily Chotiner, Yevonne Aline, MD        Allergies as of 02/19/2021 - Review Complete 02/19/2021  Allergen Reaction Noted   Percocet [oxycodone-acetaminophen] Other (See Comments) 10/23/2014    Family History  Problem Relation Age of Onset   COPD Mother    Prostate cancer Father 29       seed implant for treatment   Colon polyps Father        'a few'   Breast cancer Sister 32   Breast cancer Maternal Aunt        dx >50   Breast cancer Other 62       bilateral   Colon cancer Neg Hx     Social History   Socioeconomic History   Marital status: Married    Spouse name: Not on file   Number of children: 2   Years of education: Not on file   Highest education level: Not on file  Occupational History   Not on file  Tobacco Use   Smoking status: Every Day    Packs/day: 1.00    Types: Cigarettes   Smokeless tobacco: Never   Tobacco comments:    e-cigs  Vaping Use   Vaping Use: Former  Substance and Sexual Activity   Alcohol use: Yes    Alcohol/week: 21.0 standard drinks    Types: 21 Standard drinks or equivalent per week    Comment: 2 per weekend day; denies during the week (not consistent with prior hx)   Drug use: No   Sexual  activity: Not on file  Other Topics Concern   Not on file  Social History Narrative   Right Handed    Lives in a one story home   Social Determinants of Health   Financial Resource Strain: Not on file  Food Insecurity: Not on file  Transportation Needs:  Not on file  Physical Activity: Not on file  Stress: Not on file  Social Connections: Not on file  Intimate Partner Violence: Not on file    Review of Systems: Pertinent positive and negative review of systems were noted in the above HPI section.  All other review of systems was otherwise negative.   Physical Exam: Vital signs in last 24 hours: Temp:  [97.5 F (36.4 C)-99.2 F (37.3 C)] 98.6 F (37 C) (12/31 0515) Pulse Rate:  [40-124] 94 (12/31 0700) Resp:  [13-39] 18 (12/31 0515) BP: (89-146)/(30-117) 146/69 (12/31 0700) SpO2:  [79 %-99 %] 94 % (12/31 0700) Weight:  [61.2 kg-64 kg] 64 kg (12/31 0515) Last BM Date: 02/20/21 General:   Alert, well-developed, somewhat ill-appearing elderly white female somewhat tremulous pleasant and cooperative in NAD Head:  Normocephalic and atraumatic. Eyes:  Sclera clear, no icterus.   Conjunctiva pink. Ears:  Normal auditory acuity. Nose:  No deformity, discharge,  or lesions. Mouth:  No deformity or lesions.   Neck:  Supple; no masses or thyromegaly. Lungs:  Clear throughout to auscultation.   No wheezes, crackles, or rhonchi.  Heart: Tachycardic regular rate and rhythm; no murmurs, clicks, rubs,  or gallops. Abdomen:  Soft,nontender, BS active,nonpalp mass or hsm.   Rectal: Not done Msk:  Symmetrical without gross deformities. . Pulses:  Normal pulses noted. Extremities:  Without clubbing or edema. Neurologic:  Alert and  oriented x4;  grossly normal neurologically.  Somewhat tremulous Skin:  Intact without significant lesions or rashes.. Psych:  Alert and cooperative. Normal mood and affect.  Intake/Output from previous day: 12/30 0701 - 12/31 0700 In: 2647.7 [I.V.:177.1; IV Piggyback:2470.6] Out: -  Intake/Output this shift: Total I/O In: 120 [P.O.:120] Out: -   Lab Results: Recent Labs    02/19/21 1427 02/20/21 0410  WBC 8.5 7.0  HGB 12.7 10.1*  HCT 35.9* 29.8*  PLT 223 186   BMET Recent Labs    02/19/21 1427  02/20/21 0410  NA 132* 134*  K 3.0* 4.3  CL 93* 100  CO2 25 25  GLUCOSE 148* 106*  BUN 16 12  CREATININE 1.00 0.82  CALCIUM 8.4* 8.0*   LFT Recent Labs    02/20/21 0410  PROT 4.4*  ALBUMIN 2.6*  AST 41  ALT 28  ALKPHOS 63  BILITOT 0.7   PT/INR Recent Labs    02/19/21 1427  LABPROT 11.9  INR 0.9    IMPRESSION:  76. 75 year old white female with component of chronic diarrhea admitted with 4 weeks of worsening diarrhea, associated progressive weakness, lack of appetite, weight loss  Etiology of her chronic diarrhea is not clear, possible underlying IBS, prior duodenal biopsies raised possibility of celiac disease, also with chronic EtOH use has not had colonoscopy to rule out microscopic or other colitis since 2012  I suspect an acute infectious diarrhea currently rule out C. difficile given recent antibiotic use  2. chronic EtOH abuse-at risk for withdrawal 3. status post remote cholecystectomy 4.  History of breast cancer 5.  History of tardive dyskinesia 6.  GERD 7.  Hypokalemia secondary to above  Plan:  Await stool studies IV fluids and electrolyte replacement Patient unhappy  with heart healthy diet says she cannot eat it as no flavoring, will change to regular diet for now Sed rate, CRP, TTG and IgA If infectious work-up is negative then she will likely need colonoscopy in near future as inpatient or outpatient. Okay to use antidiarrheals scheduled GI will follow with you  Amy Esterwood PA-C 02/20/2021, 8:41 AM     Attending Physician Note   I have taken a history, reviewed the chart and examined the patient. I personally saw the patient and performed a substantive portion of this encounter, including a complete performance of at least one of the key components, in conjunction with the APP. I agree with the APP's note, impression and recommendations.   *Chronic diarrhea with dehydration, decreased appetite, hypokalemia and hypoalbuminemia. Appetite has  improved and stools are more formed the past 2 days. Prior duodenal biopsies raise possibility of celiac disease. C diff is negative. R/O infectious causes, IBD, IBS, celiac disease, microscopic colitis, alcoholism. Await GI stool panel, ESR, CRP, fecal calprotectin, tTG, IgA. Lomotil qid prn. If diagnosis is not established then colonoscopy in near future as inpatient or outpatient.   *Chronic alcohol abuse. Monitor for withdrawal.   *RML collapse with plans for bronchoscopy tomorrow.   Lucio Edward, MD [facility] See [facility], [facility], for our on call provider

## 2021-02-20 NOTE — Progress Notes (Signed)
Patient placed in observation after midnight, please see H&P.  Here with diarrhea/electrolyte disturbances and new RML occlusion due to ? Mass.  Electrolytes replaced.  Senn by pulm with plans for bronch on 1/1.  Seen by GI- supportive care.  Service dog at bedside for comfort.  Have changed CIWA to schedule and PRN for now. Eulogio Bear DO

## 2021-02-20 NOTE — ED Notes (Signed)
Nurse to nurse report called to Kathlee Nations.  [facility] to bedside to assume care of patient.

## 2021-02-20 NOTE — H&P (Signed)
History and Physical    KEIANDRA Young MGQ:[id number] DOB: 1945/04/23 DOA: 02/19/2021  PCP: Cipriano Mile, NP   Patient coming from: Home  Chief Complaint: Diarrhea, dehydration  HPI: Mindy Young is a 75 y.o. female with medical history significant for Breast cancer, alcohol abuse, COPD, HTN who presents for evaluation of dehydration and diarrhea.  She reports she has been having diarrhea every day for the last month.  She reports having 5-6 nonbloody episodes of loose stool daily.  She has had decreased p.o. intake for the last week.  She reports feeling lightheaded and just generalized fatigue when she stands up but denies having any falls or syncopal episodes.  She has shortness of breath with exertion which is chronic for her which she relates to her COPD.  She denies any chest pain or palpitations.  She has not had any fevers or chills.  She had a UTI that was treated with an antibiotic a few weeks ago.  She reports no urinary symptoms at this time.  She reports has been smoking more than normal lately.  She has a 60-pack-year history of smoking but has been trying to quit recently and was down to approximately half a pack of cigarettes a day.  For the last 2 to 3 weeks she has been back to smoking a pack of cigarettes a day.  She does admit to drinking the hall daily.  She states that she drinks "way too much" stating she has bourbon every night.  She states that when she does try to quit drinking within a few days she becomes very tremulous and feels bad.  She expresses desire to quit alcohol use.  ED Course: She is found to have tachycardia with electrolyte abnormalities of low magnesium sodium and potassium in the emergency room.  C. difficile was ordered and was negative.  D-dimer was elevated and patient had elevated troponin levels in the emergency room.  Initial troponin level was 433 and when rechecked and trended down to 358.  CT angiography was obtained of her chest that showed no  pulmonary embolism but she did have a soft tissue mass in the right bronchus causing collapse of the right middle lobe.   Lab work revealed an unremarkable CBC.  Sodium 132 potassium 3.0 chloride 93 bicarb 25 creatinine 1.0 BUN 16 glucose 148 alkaline phosphatase 71 AST 57 ALT 35 bilirubin 0.7 magnesium 0.9.  Initial lactic acid 3.9 after IV fluids lactic acid decreased to 1.3.  INR 0.9.  COVID-negative.  Influenza A and B are negative.  Alcohol level less than 10 Hospitalist service was asked to admit for further management.  Review of Systems:  General: Reports fatigue. Denies fever, chills, weight loss, night sweats. Denies change in appetite HENT: Denies head trauma, headache, denies change in hearing, tinnitus.  Denies nasal congestion or bleeding.  Denies sore throat.  Denies difficulty swallowing Eyes: Denies blurry vision, pain in eye, drainage.  Denies discoloration of eyes. Neck: Denies pain.  Denies swelling.  Denies pain with movement. Cardiovascular: Denies chest pain, palpitations.  Denies edema.  Denies orthopnea Respiratory: Reports chronic shortness of breath. Denies cough.  Denies wheezing.  Denies sputum production Gastrointestinal: Denies abdominal pain, swelling.  Denies nausea, vomiting.  Denies melena.  Denies hematemesis. Musculoskeletal: Denies limitation of movement.  Denies deformity or swelling. Denies arthralgias or myalgias. Genitourinary: Denies pelvic pain.  Denies urinary frequency or hesitancy.  Denies dysuria.  Skin: Denies rash.  Denies petechiae, purpura, ecchymosis. Neurological: Denies headache  Denies syncope. Denies seizure activity. Denies paresthesia.  Denies slurred speech, drooping face.  Denies visual change. Psychiatric: Denies depression, anxiety. Denies hallucinations.  Past Medical History:  Diagnosis Date   Alcohol abuse    Allergy    Anemia 06/29/2011   Anxiety    Arthritis    Breast cancer ([place])    right breast   C1 cervical fracture ([place])  02/1997   Cancer ([place])    melanoma   Depression    Family history of breast cancer    Family history of prostate cancer    GERD (gastroesophageal reflux disease)    Hypertension    Tardive dyskinesia     Past Surgical History:  Procedure Laterality Date   ABDOMINAL HYSTERECTOMY     ASPIRATION OF ABSCESS Right 11/20/2017   Procedure: ASPIRATION OF RIGHT AXILLARY SEROMA;  Surgeon: Rolm Bookbinder, MD;  Location: [place];  Service: General;  Laterality: Right;   AUGMENTATION MAMMAPLASTY Bilateral    biateral implants , approx 2015   BREAST LUMPECTOMY Right 11/02/2017   re-ex 11-20-17   BREAST LUMPECTOMY WITH RADIOACTIVE SEED AND SENTINEL LYMPH NODE BIOPSY Right 11/02/2017   Procedure: BREAST LUMPECTOMY WITH RADIOACTIVE SEED AND SENTINEL LYMPH NODE BIOPSY;  Surgeon: Rolm Bookbinder, MD;  Location: [place];  Service: General;  Laterality: Right;   CHOLECYSTECTOMY     collarbone     INCONTINENCE SURGERY     KNEE SURGERY     removal of cyst, repair of cartiledge   LAPAROSCOPY     for endometriosis   RE-EXCISION OF BREAST LUMPECTOMY Right 11/20/2017   Procedure: RE-EXCISION OF RIGHT BREAST MARGINS;  Surgeon: Rolm Bookbinder, MD;  Location: [place];  Service: General;  Laterality: Right;   ROTATOR CUFF REPAIR     left    Social History  reports that she has been smoking cigarettes. She has been smoking an average of 1 pack per day. She has never used smokeless tobacco. She reports current alcohol use of about 21.0 standard drinks per week. She reports that she does not use drugs.  Allergies  Allergen Reactions   Percocet [Oxycodone-Acetaminophen] Other (See Comments)    "bugs crawling on me"    Family History  Problem Relation Age of Onset   COPD Mother    Prostate cancer Father 15       seed implant for treatment   Colon polyps Father        'a few'   Breast cancer Sister 55   Breast cancer Maternal Aunt        dx  >50   Breast cancer Other 35       bilateral   Colon cancer Neg Hx      Prior to Admission medications   Medication Sig Start Date End Date Taking? Authorizing Provider  albuterol (VENTOLIN HFA) 108 (90 Base) MCG/ACT inhaler Inhale 1-2 puffs into the lungs every 4 (four) hours as needed for wheezing or shortness of breath. 10/28/20   [provider]  ALPRAZolam Duanne Moron) 0.5 MG tablet Take 0.5 mg by mouth 3 (three) times daily as needed for anxiety. 11/02/20   [provider]  Cyanocobalamin 3000 MCG CAPS Take 3,000 mcg by mouth every morning. Vitamin B12    [provider]  diphenhydrAMINE (BENADRYL) 25 MG tablet Take 50 mg by mouth at bedtime.    [provider]  diphenoxylate-atropine (LOMOTIL) 2.5-0.025 MG tablet Take 1 tablet by mouth 4 (four) times daily as needed  for diarrhea or loose stools. 11/12/20   Levin Erp, PA  fluticasone-salmeterol (ADVAIR) 250-50 MCG/ACT AEPB Inhale 1 puff into the lungs 2 (two) times daily as needed (shortness of breath/wheezing).    [provider]  HYDROcodone-acetaminophen (NORCO/VICODIN) 5-325 MG tablet Take 1 tablet by mouth every 6 (six) hours as needed for moderate pain. Patient not taking: Reported on 01/01/2021 12/01/20   Garald Balding, MD  levETIRAcetam (KEPPRA) 1000 MG tablet Take 1 tablet (1,000 mg total) by mouth 2 (two) times daily. Patient taking differently: Take 1,000 mg by mouth every 12 (twelve) hours. 06/15/20   Aline August, MD  lisinopril (ZESTRIL) 5 MG tablet Take 5 mg by mouth every morning. 09/04/20   [provider]  mirtazapine (REMERON) 15 MG tablet Take 15 mg by mouth at bedtime. 07/15/20   [provider]  ondansetron (ZOFRAN) 4 MG tablet Take 4 mg by mouth every 8 (eight) hours as needed for nausea or vomiting.    [provider]  OVER THE COUNTER MEDICATION Take 2 tablets by mouth at bedtime. Sleep 3: GABA 100 mg, Ashwagandha 60 mg, melatonin 10  mg    [provider]  oxybutynin (DITROPAN-XL) 10 MG 24 hr tablet Take 10 mg by mouth every morning. 12/05/16   [provider]  pantoprazole (PROTONIX) 40 MG tablet Take 40 mg by mouth every morning. Patient not taking: Reported on 01/01/2021    [provider]  vortioxetine HBr (TRINTELLIX) 5 MG TABS tablet Take 5 mg by mouth every morning.    [provider]    Physical Exam: Vitals:   02/19/21 2200 02/19/21 2230 02/19/21 2300 02/20/21 0100  BP: (!) 133/117 (!) 96/30 93/65 (!) 91/53  Pulse:  (!) 110 100 (!) 102  Resp: (!) 26 (!) 23 20   Temp:  98.7 F (37.1 C)    TempSrc:  Oral    SpO2:  99% 97% 95%  Weight:      Height:        Constitutional: NAD, calm, comfortable Vitals:   02/19/21 2200 02/19/21 2230 02/19/21 2300 02/20/21 0100  BP: (!) 133/117 (!) 96/30 93/65 (!) 91/53  Pulse:  (!) 110 100 (!) 102  Resp: (!) 26 (!) 23 20   Temp:  98.7 F (37.1 C)    TempSrc:  Oral    SpO2:  99% 97% 95%  Weight:      Height:       General: WDWN, Alert and oriented x3.  Eyes: EOMI, PERRL, conjunctivae normal.  Sclera nonicteric HENT:  [place]/AT, external ears normal.  Nares patent without epistasis.  Mucous membranes are moist. Posterior pharynx clear of any exudate Neck: Soft, normal range of motion, supple, no masses, Trachea midline Respiratory: Diminished breath sounds but equal, Mild diffuse rales. no wheezing, no crackles. Normal respiratory effort. No accessory muscle use.  Cardiovascular: Regular  rhythm, tachycardia. no murmurs / rubs / gallops. No extremity edema. 2+ pedal pulses. Abdomen: Soft, no tenderness, nondistended, no rebound or guarding.  No masses palpated. Bowel sounds normoactive Musculoskeletal: FROM. no cyanosis. No joint deformity upper and lower extremities. Normal muscle tone.  Skin: Warm, dry, intact no rashes, lesions, ulcers. No induration Neurologic: CN 2-12 grossly intact.  Normal speech.  Sensation intact, patella DTR  +1 bilaterally. Strength 4/5 in all extremities. No tremors. Psychiatric: Normal judgment and insight. Normal mood.    Labs on Admission: I have personally reviewed following labs and imaging studies  CBC: Recent Labs  Lab  02/19/21 1427  WBC 8.5  NEUTROABS 6.3  HGB 12.7  HCT 35.9*  MCV 101.1*  PLT 381    Basic Metabolic Panel: Recent Labs  Lab 02/19/21 1427 02/19/21 1457  NA 132*  --   K 3.0*  --   CL 93*  --   CO2 25  --   GLUCOSE 148*  --   BUN 16  --   CREATININE 1.00  --   CALCIUM 8.4*  --   MG  --  0.9*    GFR: Estimated Creatinine Clearance: 41.8 mL/min (by C-G formula based on SCr of 1 mg/dL).  Liver Function Tests: Recent Labs  Lab 02/19/21 1427  AST 57*  ALT 35  ALKPHOS 71  BILITOT 0.7  PROT 5.9*  ALBUMIN 3.4*    Urine analysis:    Component Value Date/Time   COLORURINE YELLOW 10/26/2020 1514   APPEARANCEUR HAZY (A) 10/26/2020 1514   LABSPEC 1.010 10/26/2020 1514   PHURINE 6.5 10/26/2020 1514   GLUCOSEU NEGATIVE 10/26/2020 1514   HGBUR NEGATIVE 10/26/2020 [place] 10/26/2020 [place] 10/26/2020 1514   PROTEINUR NEGATIVE 10/26/2020 1514   UROBILINOGEN 0.2 06/25/2011 1923   NITRITE NEGATIVE 10/26/2020 1514   LEUKOCYTESUR SMALL (A) 10/26/2020 1514    Radiological Exams on Admission: CT Angio Chest PE W and/or Wo Contrast  Result Date: 02/19/2021 CLINICAL DATA:  Provided indication is diarrhea for 2 weeks, some lightheadedness. History of breast carcinoma. EXAM: CT ANGIOGRAPHY CHEST WITH CONTRAST TECHNIQUE: Multidetector CT imaging of the chest was performed using the standard protocol during bolus administration of intravenous contrast. Multiplanar CT image reconstructions and MIPs were obtained to evaluate the vascular anatomy. CONTRAST:  175mL OMNIPAQUE IOHEXOL 350 MG/ML SOLN COMPARISON:  11/06/2020 FINDINGS: Cardiovascular: Pulmonary arteries are well opacified. There is no evidence of a pulmonary  embolism. Heart is normal in size and configuration. No pericardial effusion. Great vessels are normal in caliber. Aortic atherosclerosis. No dissection. 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 Mediastinum/Nodes: No enlarged mediastinal, hilar, or axillary lymph nodes. Thyroid gland, trachea, and esophagus demonstrate no significant findings. Lungs/Pleura: There is soft tissue at the origin of the right middle lobe bronchi, narrowing the distal bronchus intermedius in proximal right lower lobe bronchus. The middle lobe bronchi are occluded and the right middle lobe is collapsed. Moderate to advanced centrilobular emphysema is stable from the prior CT. Minor linear opacities are noted in the lower lobes and at the base of the left upper lobe lingula consistent with atelectasis and/or scarring. Stable pleuroparenchymal scarring at the right apex. Remainder of the lungs is clear. No pleural effusion or pneumothorax. Upper Abdomen: No acute abnormality. Musculoskeletal: Mild depression of the upper endplate of T5, stable from the prior CT. No acute fracture. No bone lesion. Bilateral retropectoral breast implants.  No chest wall mass. Review of the MIP images confirms the above findings. IMPRESSION: 1. No evidence of a pulmonary embolism. 2. Collapsed right middle lobe that appears due to obstruction at the origin of the right middle lobe bronchi. This may be due to a mass or mucous plugging. Nonemergent follow-up endoscopy recommended. 3. No acute findings within the chest. 4. Moderate to advanced emphysema.  Aortic atherosclerosis. Aortic Atherosclerosis (ICD10-I70.0) and Emphysema (ICD10-J43.9). Electronically Signed   By: Lajean Manes M.D.   On: 02/19/2021 20:28   DG Chest Portable 1 View  Result Date: 02/19/2021 CLINICAL DATA:  Weakness. EXAM: PORTABLE CHEST 1 VIEW COMPARISON:  December 26, 2020.  FINDINGS: The heart size and mediastinal contours are within normal limits. Both lungs are clear. The  visualized skeletal structures are unremarkable. IMPRESSION: No active disease. Electronically Signed   By: Marijo Conception M.D.   On: 02/19/2021 14:58    EKG: Independently reviewed.  EKG shows sinus tachycardia with occasional PACs.  Nonspecific anterior lateral ST changes but no acute ST elevation or depression.  Left anterior fascicular block noted.  Prolonged QTC at 503  Assessment/Plan Principal Problem:   Hypokalemia Ms. Sherrer is placed on medical telemetry floor for observation.  Potassium repleted in the emergency room after magnesium.  Patient only had 20 mill equivalents of potassium given.  We will give another 20 mill equivalents IV now.  Recheck electrolytes and renal function in morning  Active Problems:   Hypomagnesemia Magnesium repleted.  Recheck magnesium level in morning    Hyponatremia Mildly decreased.  Patient IV fluids.    Elevated troponin Check serial troponin. Pt with no chest pain or palpitations.  Troponin level was downtrending in the emergency room.  No acute ischemic EKG changes   Hypertension   COPD (chronic obstructive pulmonary disease) Breo substituted for advair. Albuterol as needed for SOB, cough    Diarrhea Pt has had diarrhea for past month.  Will consult GI for evaluation. Secure chat sent to Dr. Fuller Plan with [facility] On IV fluids with LR at 125 mL/h    Lung Neoplasm Has soft tissue mass at proximal right main bronchus causing collapse of right middle lobe.  Consult pulmonology for evalution and bronchoscopy to diagnosis    Prolonged QT interval Avoid medications which could further prolong QT interval    Alcohol use disorder, severe, dependence  Placed on CIWA protocol.  Ativan provided for elevated CIWA scores  Pt drinks daily, preferred drink is bourban. Will need discharge planning to help with finding alcohol rehab program    Cigarette smoker Nicotine patch to prevent withdrawals.    DVT prophylaxis: Lovenox for DVT  prophylaxis Code Status:   Full code Family Communication:  Diagnosis and plan discussed with the patient.  Patient verbalized understanding agrees with plan.  Further recommendation follow as clinical indicated Disposition Plan:   Patient is from:  Home  Anticipated DC to:  Home  Anticipated DC date:  Anticipate 2 midnight or less stay in the hospital, this may change depending on work-up and response to therapy  Consults called:  Pulmonology consulted for evaluation of soft tissue mass in right bronchus GI consulted, secure chat sent to Dr. Fuller Plan with low Exie [facility] gastroenterology Admission status:  Observation  Yevonne Aline Verbie Babic MD [facility]  How to contact the [place] Day Attending or Consulting provider [place] or covering provider during after hours [place], for this patient?   Check the care team in [facility] and look for a) attending/consulting [facility] provider listed and b) the [facility] team listed Log into [email or web address] and use [place]'s universal password to access. If you do not have the password, please contact the hospital operator. Locate the [facility] provider you are looking for under [facility] and page to a number that you can be directly reached. If you still have difficulty reaching the provider, please page the [facility] (Director on Call) for the Hospitalists listed on amion for assistance.  02/20/2021, 1:35 AM
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
## 2021-02-21 ENCOUNTER — Inpatient Hospital Stay (HOSPITAL_COMMUNITY): Payer: Medicare Other | Admitting: Certified Registered"

## 2021-02-21 ENCOUNTER — Inpatient Hospital Stay (HOSPITAL_COMMUNITY): Payer: Medicare Other

## 2021-02-21 ENCOUNTER — Encounter (HOSPITAL_COMMUNITY)
Admission: EM | Disposition: A | Discharge status: Home Health | Payer: Self-pay | Source: Home / Self Care | Attending: Internal Medicine

## 2021-02-21 DIAGNOSIS — I361 Nonrheumatic tricuspid (valve) insufficiency: Secondary | ICD-10-CM

## 2021-02-21 DIAGNOSIS — R778 Other specified abnormalities of plasma proteins: Secondary | ICD-10-CM

## 2021-02-21 DIAGNOSIS — E876 Hypokalemia: Secondary | ICD-10-CM | POA: Diagnosis not present

## 2021-02-21 HISTORY — PX: VIDEO BRONCHOSCOPY: SHX5072

## 2021-02-21 HISTORY — PX: BRONCHIAL WASHINGS: SHX5105

## 2021-02-21 LAB — ECHOCARDIOGRAM COMPLETE
AR max vel: 2.83 cm2
AV Area VTI: 2.99 cm2
AV Area mean vel: 2.72 cm2
AV Mean grad: 2 mmHg
AV Peak grad: 3.7 mmHg
Ao pk vel: 0.97 m/s
Area-P 1/2: 5.66 cm2
Height: 62 in
MV VTI: 3.6 cm2
S' Lateral: 3.8 cm
Single Plane A4C EF: 47.1 %
Weight: 2316.8 oz

## 2021-02-21 LAB — COMPREHENSIVE METABOLIC PANEL
ALT: 29 U/L (ref 0–44)
AST: 43 U/L — ABNORMAL HIGH (ref 15–41)
Albumin: 2.4 g/dL — ABNORMAL LOW (ref 3.5–5.0)
Alkaline Phosphatase: 60 U/L (ref 38–126)
Anion gap: 6 (ref 5–15)
BUN: 7 mg/dL — ABNORMAL LOW (ref 8–23)
CO2: 27 mmol/L (ref 22–32)
Calcium: 8.2 mg/dL — ABNORMAL LOW (ref 8.9–10.3)
Chloride: 99 mmol/L (ref 98–111)
Creatinine, Ser: 0.74 mg/dL (ref 0.44–1.00)
GFR, Estimated: >60 mL/min (ref >60)
Glucose, Bld: 94 mg/dL (ref 70–99)
Potassium: 4.6 mmol/L (ref 3.5–5.1)
Sodium: 132 mmol/L — ABNORMAL LOW (ref 135–145)
Total Bilirubin: 0.3 mg/dL (ref 0.3–1.2)
Total Protein: 4.3 g/dL — ABNORMAL LOW (ref 6.5–8.1)

## 2021-02-21 LAB — CBC
HCT: 29.3 % — ABNORMAL LOW (ref 36.0–46.0)
Hemoglobin: 9.8 g/dL — ABNORMAL LOW (ref 12.0–15.0)
MCH: 35.3 pg — ABNORMAL HIGH (ref 26.0–34.0)
MCHC: 33.4 g/dL (ref 30.0–36.0)
MCV: 105.4 fL — ABNORMAL HIGH (ref 80.0–100.0)
Platelets: 185 10*3/uL (ref 150–400)
RBC: 2.78 MIL/uL — ABNORMAL LOW (ref 3.87–5.11)
RDW: 12.8 % (ref 11.5–15.5)
WBC: 5.5 10*3/uL (ref 4.0–10.5)
nRBC: 0 % (ref 0.0–0.2)

## 2021-02-21 LAB — VITAMIN B12: Vitamin B-12: 575 pg/mL (ref 180–914)

## 2021-02-21 LAB — FOLATE: Folate: 68.7 ng/mL (ref >5.9)

## 2021-02-21 LAB — IRON AND TIBC
Iron: 57 ug/dL (ref 28–170)
Saturation Ratios: 30 % (ref 10.4–31.8)
TIBC: 189 ug/dL — ABNORMAL LOW (ref 250–450)
UIBC: 132 ug/dL

## 2021-02-21 LAB — FERRITIN: Ferritin: 163 ng/mL (ref 11–307)

## 2021-02-21 SURGERY — VIDEO BRONCHOSCOPY WITHOUT FLUORO
Anesthesia: General | Laterality: Right

## 2021-02-21 MED ORDER — MIDAZOLAM HCL 2 MG/2ML IJ SOLN
INTRAMUSCULAR | Status: DC | PRN
  Administered 2021-02-21: 2 mg via INTRAVENOUS

## 2021-02-21 MED ORDER — FENTANYL CITRATE (PF) 100 MCG/2ML IJ SOLN: 25.0000 ug | INTRAMUSCULAR | Status: DC | PRN

## 2021-02-21 MED ORDER — FENTANYL CITRATE (PF) 100 MCG/2ML IJ SOLN
25.0000 ug | INTRAMUSCULAR | Status: DC | PRN
  Administered 2021-02-21: 50 ug via INTRAVENOUS

## 2021-02-21 MED ORDER — DEXAMETHASONE SODIUM PHOSPHATE 10 MG/ML IJ SOLN
INTRAMUSCULAR | Status: DC | PRN
Start: 2021-02-21 — End: 2021-02-21
  Administered 2021-02-21: 5 mg via INTRAVENOUS

## 2021-02-21 MED ORDER — PROPOFOL 10 MG/ML IV BOLUS
INTRAVENOUS | Status: DC | PRN
Start: 2021-02-21 — End: 2021-02-21
  Administered 2021-02-21: 150 mg via INTRAVENOUS

## 2021-02-21 MED ORDER — ONDANSETRON HCL 4 MG/2ML IJ SOLN
INTRAMUSCULAR | Status: DC | PRN
  Administered 2021-02-21: 4 mg via INTRAVENOUS

## 2021-02-21 MED ORDER — SUGAMMADEX SODIUM 200 MG/2ML IV SOLN
INTRAVENOUS | Status: DC | PRN
  Administered 2021-02-21: 100 mg via INTRAVENOUS
  Administered 2021-02-21: 400 mg via INTRAVENOUS

## 2021-02-21 MED ORDER — LACTATED RINGERS IV SOLN
INTRAVENOUS | Status: AC | PRN
  Administered 2021-02-21: 20 mL/h via INTRAVENOUS

## 2021-02-21 MED ORDER — ROCURONIUM BROMIDE 10 MG/ML (PF) SYRINGE
PREFILLED_SYRINGE | INTRAVENOUS | Status: DC | PRN
  Administered 2021-02-21: 50 mg via INTRAVENOUS

## 2021-02-21 MED ORDER — LIDOCAINE 2% (20 MG/ML) 5 ML SYRINGE
INTRAMUSCULAR | Status: DC | PRN
  Administered 2021-02-21: 60 mg via INTRAVENOUS

## 2021-02-21 NOTE — Op Note (Signed)
Bronchoscopy Procedure Note  Mindy Young  [id number]  [birth year]  Date:02/21/21  Time:11:23 AM   Provider Performing:Diangelo Radel C Tamala Julian   Procedure(s):  Flexible bronchoscopy with bronchial alveolar lavage [phone])  Indication(s) RML atelectasis  Consent Risks of the procedure as well as the alternatives and risks of each were explained to the patient and/or caregiver.  Consent for the procedure was obtained and is signed in the bedside chart  Anesthesia General   Time Out Verified patient identification, verified procedure, site/side was marked, verified correct patient position, special equipment/implants available, medications/allergies/relevant history reviewed, required imaging and test results available.   Sterile Technique Usual hand hygiene, masks, gowns, and gloves were used   Procedure Description Bronchoscope advanced through endotracheal tube and into airway.  Airways were examined down to subsegmental level with findings noted below.   Following diagnostic evaluation, BAL(s) performed in RML with normal saline and return of clear fluid  Findings:  - Chronic bronchiitic changes and bronchial webbing - RML mucus plugging, suctioned, no underlying mass - BAL in RML   Complications/Tolerance None; patient tolerated the procedure well. Chest X-ray is not needed post procedure.   EBL Minimal   Specimen(s) RML BAL

## 2021-02-21 NOTE — Transfer of Care (Signed)
Immediate Anesthesia Transfer of Care Note  Patient: Mindy Young  Procedure(s) Performed: VIDEO BRONCHOSCOPY WITHOUT FLUORO (Right) BRONCHIAL WASHINGS  Patient Location: PACU  Anesthesia Type:General  Level of Consciousness: awake, alert  and oriented  Airway & Oxygen Therapy: Patient connected to face mask oxygen  Post-op Assessment: Post -op Vital signs reviewed and stable  Post vital signs: stable  Last Vitals:  Vitals Value Taken Time  BP    Temp    Pulse 99 02/21/21 1135  Resp 31 02/21/21 1135  SpO2 99 % 02/21/21 1135  Vitals shown include unvalidated device data.  Last Pain:  Vitals:   02/21/21 1031  TempSrc: Temporal  PainSc: 0-No pain         Complications: No notable events documented.

## 2021-02-21 NOTE — Progress Notes (Addendum)
Progress Note   Subjective  3 semi-formed stools over the past day, substantially improved. Appetite improved.    Objective  Vital signs in last 24 hours: Temp:  [97.6 F (36.4 C)-97.8 F (36.6 C)] 97.6 F (36.4 C) (01/01 0458) Pulse Rate:  [84-102] 84 (01/01 0458) Resp:  [19] 19 (01/01 0458) BP: (113-134)/(64-78) 134/78 (01/01 0458) SpO2:  [95 %-100 %] 100 % (01/01 0458) Weight:  [65.7 kg] 65.7 kg (01/01 0504) Last BM Date: 02/20/21  General: Alert, well-developed, in NAD Heart:  Regular rate and rhythm; no murmurs Chest: Clear to ascultation bilaterally Abdomen:  Soft, nontender and nondistended. Normal bowel sounds, without guarding, and without rebound.   Extremities:  Without edema. Neurologic:  Alert and  oriented x4; grossly normal neurologically. Psych:  Alert and cooperative. Normal mood and affect.  Intake/Output from previous day: 12/31 0701 - 01/01 0700 In: 1112.9 [P.O.:320; I.V.:792.9] Out: 1400 [Urine:1400] Intake/Output this shift: Total I/O In: -  Out: 500 [Urine:500]  Lab Results: Recent Labs    02/19/21 1427 02/20/21 0410 02/21/21 0413  WBC 8.5 7.0 5.5  HGB 12.7 10.1* 9.8*  HCT 35.9* 29.8* 29.3*  PLT 223 186 185   BMET Recent Labs    02/19/21 1427 02/20/21 0410 02/21/21 0413  NA 132* 134* 132*  K 3.0* 4.3 4.6  CL 93* 100 99  CO2 _0 GLUCOSE 148* 106* 94  BUN 16 12 7*  CREATININE 1.00 0.82 0.74  CALCIUM 8.4* 8.0* 8.2*   LFT Recent Labs    02/21/21 0413  PROT 4.3*  ALBUMIN 2.4*  AST 43*  ALT 29  ALKPHOS 60  BILITOT 0.3   PT/INR Recent Labs    02/19/21 1427  LABPROT 11.9  INR 0.9    Studies/Results: CT Angio Chest PE W and/or Wo Contrast  Result Date: 02/19/2021 CLINICAL DATA:  Provided indication is diarrhea for 2 weeks, some lightheadedness. History of breast carcinoma. EXAM: CT ANGIOGRAPHY CHEST WITH CONTRAST TECHNIQUE: Multidetector CT imaging of the chest was performed using the standard protocol  during bolus administration of intravenous contrast. Multiplanar CT image reconstructions and MIPs were obtained to evaluate the vascular anatomy. CONTRAST:  176m OMNIPAQUE IOHEXOL 350 MG/ML SOLN COMPARISON:  11/06/2020 FINDINGS: Cardiovascular: Pulmonary arteries are well opacified. There is no evidence of a pulmonary embolism. Heart is normal in size and configuration. No pericardial effusion. Great vessels are normal in caliber. Aortic atherosclerosis. No dissection. 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 Mediastinum/Nodes: No enlarged mediastinal, hilar, or axillary lymph nodes. Thyroid gland, trachea, and esophagus demonstrate no significant findings. Lungs/Pleura: There is soft tissue at the origin of the right middle lobe bronchi, narrowing the distal bronchus intermedius in proximal right lower lobe bronchus. The middle lobe bronchi are occluded and the right middle lobe is collapsed. Moderate to advanced centrilobular emphysema is stable from the prior CT. Minor linear opacities are noted in the lower lobes and at the base of the left upper lobe lingula consistent with atelectasis and/or scarring. Stable pleuroparenchymal scarring at the right apex. Remainder of the lungs is clear. No pleural effusion or pneumothorax. Upper Abdomen: No acute abnormality. Musculoskeletal: Mild depression of the upper endplate of T5, stable from the prior CT. No acute fracture. No bone lesion. Bilateral retropectoral breast implants.  No chest wall mass. Review of the MIP images confirms the above findings. IMPRESSION: 1. No evidence of a pulmonary embolism. 2. Collapsed right middle lobe that appears due to obstruction at the  origin of the right middle lobe bronchi. This may be due to a mass or mucous plugging. Nonemergent follow-up endoscopy recommended. 3. No acute findings within the chest. 4. Moderate to advanced emphysema.  Aortic atherosclerosis. Aortic Atherosclerosis (ICD10-I70.0) and Emphysema  (ICD10-J43.9). Electronically Signed   By: Lajean Manes M.D.   On: 02/19/2021 20:28   DG Chest Portable 1 View  Result Date: 02/19/2021 CLINICAL DATA:  Weakness. EXAM: PORTABLE CHEST 1 VIEW COMPARISON:  December 26, 2020. FINDINGS: The heart size and mediastinal contours are within normal limits. Both lungs are clear. The visualized skeletal structures are unremarkable. IMPRESSION: No active disease. Electronically Signed   By: Marijo Conception M.D.   On: 02/19/2021 14:58      Assessment & Recommendations   Diarrhea substantially improved, currently with semi-formed stools. Stool studies for infectious causes are negative. ESR, CRP were normal. Celiac antibodies are pending. Calprotectin not sent. Continue Lomotil qid prn. Since diarrhea and appetite have improved we will defer further GI evaluation to the outpatient setting.  Hypokalemia, corrected.  Macrocytic anemia unmasked with correction of dehydration. No evidence of overt GI bleeding. Anemia panel and further evaluation as outpatient  by PCP and Dr. Silverio Decamp. Abnormal RML collapse for bronchoscopy today.         GI follow up with Dr. Silverio Decamp in 2 weeks. GI signing off.      LOS: 1 day   Lyndzie Zentz T. Fuller Plan  MD 02/21/2021, 9:45 AM See Shea Evans, [facility], to contact our on call provider

## 2021-02-21 NOTE — Anesthesia Postprocedure Evaluation (Signed)
Anesthesia Post Note  Patient: Mindy Young  Procedure(s) Performed: VIDEO BRONCHOSCOPY WITHOUT FLUORO (Right) BRONCHIAL WASHINGS     Patient location during evaluation: PACU Anesthesia Type: General Level of consciousness: awake Pain management: pain level controlled Vital Signs Assessment: post-procedure vital signs reviewed and stable Respiratory status: spontaneous breathing Cardiovascular status: stable Postop Assessment: no apparent nausea or vomiting Anesthetic complications: no   No notable events documented.  Last Vitals:  Vitals:   02/21/21 1205 02/21/21 1217  BP: 131/89 (!) 149/88  Pulse: 93 98  Resp: 17 18  Temp: (!) 36.2 C 36.7 C  SpO2: 93% 93%    Last Pain:  Vitals:   02/21/21 1217  TempSrc: Oral  PainSc:                  [name]

## 2021-02-21 NOTE — Progress Notes (Incomplete)
*  PRELIMINARY RESULTS* Echocardiogram 2D Echocardiogram has been performed.  Elpidio Anis 02/21/2021, 3:20 PM

## 2021-02-21 NOTE — Anesthesia Procedure Notes (Signed)
Procedure Name: Intubation Date/Time: 02/21/2021 11:19 AM Performed by: Lavell Luster, CRNA Pre-anesthesia Checklist: Patient identified, Emergency Drugs available, Suction available, Patient being monitored and Timeout performed Patient Re-evaluated:Patient Re-evaluated prior to induction Oxygen Delivery Method: Circle system utilized Preoxygenation: Pre-oxygenation with 100% oxygen Induction Type: IV induction Ventilation: Mask ventilation without difficulty Laryngoscope Size: Mac, 3 and Glidescope Grade View: Grade I Tube type: Oral Tube size: 8.5 mm Number of attempts: 1 Airway Equipment and Method: Stylet Placement Confirmation: ETT inserted through vocal cords under direct vision, positive ETCO2 and breath sounds checked- equal and bilateral Secured at: 22 cm Tube secured with: Tape Dental Injury: Teeth and Oropharynx as per pre-operative assessment

## 2021-02-21 NOTE — Plan of Care (Signed)
  Problem: Education: Goal: Knowledge of General Education information will improve Description Including pain rating scale, medication(s)/side effects and non-pharmacologic comfort measures Outcome: Progressing   

## 2021-02-21 NOTE — Anesthesia Preprocedure Evaluation (Addendum)
Anesthesia Evaluation  Patient identified by MRN, date of birth, ID band Patient awake    Reviewed: Allergy & Precautions, NPO status , Patient's Chart, lab work & pertinent test results  Airway Mallampati: II  TM Distance: >3 FB     Dental   Pulmonary COPD, Current Smoker and Patient abstained from smoking.,    breath sounds clear to auscultation       Cardiovascular hypertension,  Rhythm:Regular Rate:Normal     Neuro/Psych Seizures -,  PSYCHIATRIC DISORDERS    GI/Hepatic Neg liver ROS, GERD  ,  Endo/Other  negative endocrine ROS  Renal/GU negative Renal ROS     Musculoskeletal   Abdominal   Peds  Hematology   Anesthesia Other Findings   Reproductive/Obstetrics                            Anesthesia Physical Anesthesia Plan  ASA: 3  Anesthesia Plan: General   Post-op Pain Management:    Induction:   PONV Risk Score and Plan: 3 and Ondansetron, Dexamethasone and Midazolam  Airway Management Planned: Oral ETT  Additional Equipment:   Intra-op Plan:   Post-operative Plan: Possible Post-op intubation/ventilation  Informed Consent: I have reviewed the patients History and Physical, chart, labs and discussed the procedure including the risks, benefits and alternatives for the proposed anesthesia with the patient or authorized representative who has indicated his/her understanding and acceptance.     Dental advisory given  Plan Discussed with: Anesthesiologist and CRNA  Anesthesia Plan Comments:        Anesthesia Quick Evaluation

## 2021-02-21 NOTE — Progress Notes (Signed)
02/21/2021   I have seen and evaluated the patient for abnormal imaging.  S:  Seen in endo. Remains anxious. Diarrhea markedly improved.  O: Blood pressure (!) 144/75, pulse (!) 108, temperature (!) 97.1 F (36.2 C), temperature source Temporal, resp. rate 17, height 5\' 2"  (1.575 m), weight 65.7 kg, SpO2 99 %.  No distress Lungs clear Ext warm Heart sounds regular  A:  RML atelectasis in smoker  P:  Proceed with bronch: biopsy if mass, culture if mucus plugging Will try to update patient post procedure, maybe home today and I can arrange f/u in clinic  Erskine Emery MD [facility] epic messenger for cross cover needs If after hours, please call E-link

## 2021-02-21 NOTE — Social Work (Signed)
°  CSW attempted to acknowledge SU consult however pt was snoring and did not awaken to her name being called. Will attempt at a later time.

## 2021-02-21 NOTE — Progress Notes (Signed)
Progress Note    Mindy Young  IZT:[id number] DOB: 10-Dec-1945  DOA: 02/19/2021 PCP: Cipriano Mile, NP    Brief Narrative:     Medical records reviewed and are as summarized below:  Mindy Young is an 76 y.o. female with medical history significant for Breast cancer, alcohol abuse, COPD, HTN who presents for evaluation of dehydration and diarrhea.  She reports she has been having diarrhea every day for the last month.  S/p bronch.  Now with alcohol withdrawal.  ? Rehab vs palliative care consult if she does not plan of stopping drinking.  Assessment/Plan:   Principal Problem:   Hypokalemia Active Problems:   Hyponatremia   Cigarette smoker   Hypertension   Alcohol use disorder, severe, dependence (HCC)   Hypomagnesemia   Elevated troponin   COPD (chronic obstructive pulmonary disease) (HCC)   Diarrhea   Prolonged QT interval   Lung neoplasm   Hypokalemia/hypomagnesemia -repleted -due to diarrhea  Alcohol withdrawal -CIWA- scheduled and PRN -may need phenobarbital addition if worsens -currently the main issue and reason for hospitalization    Hyponatremia Mildly decreased.  Patient IV fluids.     Elevated troponin -check echo -trending down -? T wave changes on EKG  H/o seizures -keppra    COPD (chronic obstructive pulmonary disease) Breo substituted for advair. Albuterol as needed for SOB, cough     Diarrhea Pt has had diarrhea for past month.  GI: Diarrhea substantially improved, currently with semi-formed stools. Stool studies for infectious causes are negative. ESR, CRP were normal. Celiac antibodies are pending. Calprotectin not sent. Continue Lomotil qid prn. Since diarrhea and appetite have improved we will defer further GI evaluation to the outpatient setting.    Mucous plug -s/p bronch BAL in RML    Cigarette smoker Nicotine patch  -encourage cessation      Family Communication/Anticipated D/C date and plan/Code Status   DVT  prophylaxis: Lovenox ordered. Code Status: Full Code.  Family Communication: called laura (caregiver)-- has daughter but does have a relationship any longer Disposition Plan: Status is: Inpatient  Remains inpatient appropriate because: alcohol withdrawal         Medical Consultants:   GI Pccm     Subjective:   tremulous  Objective:    Vitals:   02/21/21 1135 02/21/21 1150 02/21/21 1205 02/21/21 1217  BP: (!) 141/90 (!) 146/95 131/89 (!) 149/88  Pulse: 99 100 93 98  Resp: (!) 31 (!) _0 Temp: 98 F (36.7 C)  (!) 97.1 F (36.2 C) 98 F (36.7 C)  TempSrc:    Oral  SpO2: 99% 93% 93% 93%  Weight:      Height:        Intake/Output Summary (Last 24 hours) at 02/21/2021 1515 Last data filed at 02/21/2021 1446 Gross per 24 hour  Intake 300 ml  Output 1800 ml  Net -1500 ml   Filed Weights   02/19/21 1355 02/20/21 0515 02/21/21 0504  Weight: 61.2 kg 64 kg 65.7 kg    Exam:  General: Appearance:     Overweight female tremulous     Lungs:     respirations unlabored  Heart:    Normal heart rate.    MS:   All extremities are intact.    Neurologic:   Awake, but confused     Data Reviewed:   I have personally reviewed following labs and imaging studies:  Labs: Labs show the following:   Basic Metabolic Panel: Recent Labs  Lab 02/19/21 1427 02/19/21 1457 02/20/21 0410 02/21/21 0413  NA 132*  --  134* 132*  K 3.0*  --  4.3 4.6  CL 93*  --  100 99  CO2 25  --  25 27  GLUCOSE 148*  --  106* 94  BUN 16  --  12 7*  CREATININE 1.00  --  0.82 0.74  CALCIUM 8.4*  --  8.0* 8.2*  MG  --  0.9* 2.1  --    GFR Estimated Creatinine Clearance: 54 mL/min (by C-G formula based on SCr of 0.74 mg/dL). Liver Function Tests: Recent Labs  Lab 02/19/21 1427 02/20/21 0410 02/21/21 0413  AST 57* 41 43*  ALT 35 28 29  ALKPHOS 71 63 60  BILITOT 0.7 0.7 0.3  PROT 5.9* 4.4* 4.3*  ALBUMIN 3.4* 2.6* 2.4*   Recent Labs  Lab 02/20/21 0410  LIPASE 70*   No  results for input(s): AMMONIA in the last 168 hours. Coagulation profile Recent Labs  Lab 02/19/21 1427  INR 0.9    CBC: Recent Labs  Lab 02/19/21 1427 02/20/21 0410 02/21/21 0413  WBC 8.5 7.0 5.5  NEUTROABS 6.3  --   --   HGB 12.7 10.1* 9.8*  HCT 35.9* 29.8* 29.3*  MCV 101.1* 103.1* 105.4*  PLT 223 186 185   Cardiac Enzymes: No results for input(s): CKTOTAL, CKMB, CKMBINDEX, TROPONINI in the last 168 hours. BNP (last 3 results) No results for input(s): PROBNP in the last 8760 hours. CBG: No results for input(s): GLUCAP in the last 168 hours. D-Dimer: Recent Labs    02/19/21 1457  DDIMER 1.13*   Hgb A1c: No results for input(s): HGBA1C in the last 72 hours. Lipid Profile: No results for input(s): CHOL, HDL, LDLCALC, TRIG, CHOLHDL, LDLDIRECT in the last 72 hours. Thyroid function studies: No results for input(s): TSH, T4TOTAL, T3FREE, THYROIDAB in the last 72 hours.  Invalid input(s): FREET3 Anemia work up: Recent Labs    02/21/21 0958  VITAMINB12 575  FERRITIN 163  TIBC 189*  IRON 57   Sepsis Labs: Recent Labs  Lab 02/19/21 1427 02/19/21 1457 02/19/21 1700 02/20/21 0410 02/21/21 0413  WBC 8.5  --   --  7.0 5.5  LATICACIDVEN  --  3.9* 1.3  --   --     Microbiology Recent Results (from the past 240 hour(s))  Resp Panel by RT-PCR (Flu A&B, Covid) Nasopharyngeal Swab     Status: None   Collection Time: 02/19/21  2:57 PM   Specimen: Nasopharyngeal Swab; Nasopharyngeal(NP) swabs in vial transport medium  Result Value Ref Range Status   SARS Coronavirus 2 by RT PCR NEGATIVE NEGATIVE Final    Comment: (NOTE) SARS-CoV-2 target nucleic acids are NOT DETECTED.  The SARS-CoV-2 RNA is generally detectable in upper respiratory specimens during the acute phase of infection. The lowest concentration of SARS-CoV-2 viral copies this assay can detect is 138 copies/mL. A negative result does not preclude SARS-Cov-2 infection and should not be used as the sole  basis for treatment or other patient management decisions. A negative result may occur with  improper specimen collection/handling, submission of specimen other than nasopharyngeal swab, presence of viral mutation(s) within the areas targeted by this assay, and inadequate number of viral copies(<138 copies/mL). A negative result must be combined with clinical observations, patient history, and epidemiological information. The expected result is Negative.  Fact Sheet for Patients:  EntrepreneurPulse.com.au  Fact Sheet for Healthcare Providers:  IncredibleEmployment.be  This test is no t  yet approved or cleared by the [place] and  has been authorized for detection and/or diagnosis of SARS-CoV-2 by FDA under an Emergency Use Authorization (EUA). This EUA will remain  in effect (meaning this test can be used) for the duration of the COVID-19 declaration under Section 564(b)(1) of the Act, 21 U.S.C.section 360bbb-3(b)(1), unless the authorization is terminated  or revoked sooner.       Influenza A by PCR NEGATIVE NEGATIVE Final   Influenza B by PCR NEGATIVE NEGATIVE Final    Comment: (NOTE) The Xpert Xpress SARS-CoV-2/FLU/RSV plus assay is intended as an aid in the diagnosis of influenza from Nasopharyngeal swab specimens and should not be used as a sole basis for treatment. Nasal washings and aspirates are unacceptable for Xpert Xpress SARS-CoV-2/FLU/RSV testing.  Fact Sheet for Patients: EntrepreneurPulse.com.au  Fact Sheet for Healthcare Providers: IncredibleEmployment.be  This test is not yet approved or cleared by the [place] FDA and has been authorized for detection and/or diagnosis of SARS-CoV-2 by FDA under an Emergency Use Authorization (EUA). This EUA will remain in effect (meaning this test can be used) for the duration of the COVID-19 declaration under Section 564(b)(1) of the Act,  21 U.S.C. section 360bbb-3(b)(1), unless the authorization is terminated or revoked.  Performed at [facility], [address] [place], [place] [address]   Culture, blood (Routine x 2)     Status: None (Preliminary result)   Collection Time: 02/19/21  3:55 PM   Specimen: BLOOD  Result Value Ref Range Status   Specimen Description   Final    BLOOD Blood Culture adequate volume Performed at [facility], [address] [place], [place] [address]    Special Requests   Final    AEROBIC BOTTLE ONLY BLOOD LEFT FOREARM Performed at [facility], [address] [place], [place] [address]    Culture   Final    NO GROWTH 2 DAYS Performed at [facility], [place] [address], [place], [place] [address]    Report Status PENDING  Incomplete  Culture, blood (Routine x 2)     Status: None (Preliminary result)   Collection Time: 02/19/21  5:00 PM   Specimen: BLOOD LEFT HAND  Result Value Ref Range Status   Specimen Description   Final    BLOOD LEFT HAND BLOOD Performed at [facility], [address] [place], [place] [address]    Special Requests   Final    Blood Culture results may not be optimal due to an inadequate volume of blood received in culture bottles [place] Performed at [facility], [address] [place], [place] [address]    Culture   Final    NO GROWTH 2 DAYS Performed at [facility], [place] [address], [place], [place] [address]    Report Status PENDING  Incomplete  Gastrointestinal Panel by PCR , Stool     Status: None   Collection Time: 02/19/21  7:45 PM   Specimen: Stool  Result Value Ref Range Status   Campylobacter species NOT DETECTED NOT DETECTED Final   Plesimonas shigelloides NOT DETECTED NOT DETECTED Final   Salmonella species NOT DETECTED NOT DETECTED Final   Yersinia enterocolitica NOT DETECTED NOT DETECTED Final   Vibrio species NOT DETECTED NOT DETECTED Final   Vibrio  cholerae NOT DETECTED NOT DETECTED Final   Enteroaggregative E coli (EAEC) NOT DETECTED NOT DETECTED  Final   Enteropathogenic E coli (EPEC) NOT DETECTED NOT DETECTED Final   Enterotoxigenic E coli (ETEC) NOT DETECTED NOT DETECTED Final   Shiga like toxin producing E coli (STEC) NOT DETECTED NOT DETECTED Final   Shigella/Enteroinvasive E coli (EIEC) NOT DETECTED NOT DETECTED Final   Cryptosporidium NOT DETECTED NOT DETECTED Final   Cyclospora cayetanensis NOT DETECTED NOT DETECTED Final   Entamoeba histolytica NOT DETECTED NOT DETECTED Final   Giardia lamblia NOT DETECTED NOT DETECTED Final   Adenovirus F40/41 NOT DETECTED NOT DETECTED Final   Astrovirus NOT DETECTED NOT DETECTED Final   Norovirus GI/GII NOT DETECTED NOT DETECTED Final   Rotavirus A NOT DETECTED NOT DETECTED Final   Sapovirus (I, II, IV, and V) NOT DETECTED NOT DETECTED Final    Comment: Performed at [facility], [address] [place], [place] [address]  C Difficile Quick Screen w PCR reflex     Status: None   Collection Time: 02/19/21  7:45 PM   Specimen: Stool  Result Value Ref Range Status   C Diff antigen NEGATIVE NEGATIVE Final   C Diff toxin NEGATIVE NEGATIVE Final   C Diff interpretation No C. difficile detected.  Final    Comment: Performed at [facility], [place] [address] [place], [place] [address]    Procedures and diagnostic studies:  CT Angio Chest PE W and/or Wo Contrast  Result Date: 02/19/2021 CLINICAL DATA:  Provided indication is diarrhea for 2 weeks, some lightheadedness. History of breast carcinoma. EXAM: CT ANGIOGRAPHY CHEST WITH CONTRAST TECHNIQUE: Multidetector CT imaging of the chest was performed using the standard protocol during bolus administration of intravenous contrast. Multiplanar CT image reconstructions and MIPs were obtained to evaluate the vascular anatomy. CONTRAST:  171m OMNIPAQUE IOHEXOL 350 MG/ML SOLN COMPARISON:  11/06/2020 FINDINGS: Cardiovascular: Pulmonary  arteries are well opacified. There is no evidence of a pulmonary embolism. Heart is normal in size and configuration. No pericardial effusion. Great vessels are normal in caliber. Aortic atherosclerosis. No dissection. 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 Mediastinum/Nodes: No enlarged mediastinal, hilar, or axillary lymph nodes. Thyroid gland, trachea, and esophagus demonstrate no significant findings. Lungs/Pleura: There is soft tissue at the origin of the right middle lobe bronchi, narrowing the distal bronchus intermedius in proximal right lower lobe bronchus. The middle lobe bronchi are occluded and the right middle lobe is collapsed. Moderate to advanced centrilobular emphysema is stable from the prior CT. Minor linear opacities are noted in the lower lobes and at the base of the left upper lobe lingula consistent with atelectasis and/or scarring. Stable pleuroparenchymal scarring at the right apex. Remainder of the lungs is clear. No pleural effusion or pneumothorax. Upper Abdomen: No acute abnormality. Musculoskeletal: Mild depression of the upper endplate of T5, stable from the prior CT. No acute fracture. No bone lesion. Bilateral retropectoral breast implants.  No chest wall mass. Review of the MIP images confirms the above findings. IMPRESSION: 1. No evidence of a pulmonary embolism. 2. Collapsed right middle lobe that appears due to obstruction at the origin of the right middle lobe bronchi. This may be due to a mass or mucous plugging. Nonemergent follow-up endoscopy recommended. 3. No acute findings within the chest. 4. Moderate to advanced emphysema.  Aortic atherosclerosis. Aortic Atherosclerosis (ICD10-I70.0) and Emphysema (ICD10-J43.9). Electronically Signed   By: DLajean ManesM.D.   On: 02/19/2021 20:28    Medications:    enoxaparin (LOVENOX) injection  40 mg Subcutaneous Daily   fluticasone furoate-vilanterol  1 puff Inhalation Daily   folic acid  1 mg Oral Daily    levETIRAcetam  1,000 mg Oral Q12H   lisinopril  5 mg Oral q morning   LORazepam  0-4 mg Intravenous Q4H   Followed by   Derrill Memo ON 02/22/2021] LORazepam  0-4 mg Intravenous Q8H   melatonin  10 mg Oral QHS   multivitamin with minerals  1 tablet Oral Daily   nicotine  14 mg Transdermal Daily   thiamine  100 mg Oral Daily   Or   thiamine  100 mg Intravenous Daily   Continuous Infusions:  lactated ringers Stopped (02/21/21 1135)     LOS: 1 day   Geradine Girt  [facility]   How to contact the [facility] Attending or Consulting provider [place] or covering provider during after hours [place], for this patient?  Check the care team in [facility] and look for a) attending/consulting [facility] provider listed and b) the [facility] team listed Log into [email or web address] and use [place]'s universal password to access. If you do not have the password, please contact the hospital operator. Locate the [facility] provider you are looking for under [facility] and page to a number that you can be directly reached. If you still have difficulty reaching the provider, please page the [facility] (Director on Call) for the Hospitalists listed on amion for assistance.  02/21/2021, 3:15 PM

## 2021-02-22 ENCOUNTER — Encounter (HOSPITAL_COMMUNITY): Payer: Self-pay | Admitting: Internal Medicine

## 2021-02-22 DIAGNOSIS — E876 Hypokalemia: Secondary | ICD-10-CM | POA: Diagnosis not present

## 2021-02-22 LAB — LEVETIRACETAM LEVEL: Levetiracetam Lvl: 55.5 ug/mL — ABNORMAL HIGH (ref 10.0–40.0)

## 2021-02-22 MED ORDER — GUAIFENESIN ER 600 MG PO TB12
600.0000 mg | ORAL_TABLET | Freq: Two times a day (BID) | ORAL | Status: DC
  Administered 2021-02-22 – 2021-02-23 (×3): 600 mg via ORAL
  Filled 2021-02-22 (×3): qty 1

## 2021-02-22 NOTE — TOC Progression Note (Signed)
Transition of Care [facility]) - Progression Note    Patient Details  Name: Mindy Young MRN: [id number] Date of Birth: [birth year]  Transition of Care [facility]) CM/SW Contact  Zenon Mayo, RN Phone Number: 02/22/2021, 4:51 PM  Clinical Narrative:    Patient is from home, has a caregiver, and a service dog, etoh withdrawal, on ciwa scheduled ativan.  TOC will continue to follow for dc needs.         Expected Discharge Plan and Services                                                 Social Determinants of Health (SDOH) Interventions    Readmission Risk Interventions No flowsheet data found.

## 2021-02-22 NOTE — Progress Notes (Signed)
Progress Note    Mindy Young  GQQ:[id number] DOB: 01/10/1946  DOA: 02/19/2021 PCP: Cipriano Mile, NP    Brief Narrative:     Medical records reviewed and are as summarized below:  Mindy Young is an 76 y.o. female with medical history significant for Breast cancer, alcohol abuse, COPD, HTN who presents for evaluation of dehydration and diarrhea.  She reports she has been having diarrhea every day for the last month.  S/p bronch.  Now with alcohol withdrawal.  ? Rehab vs palliative care consult if she does not plan of stopping drinking.  Assessment/Plan:   Principal Problem:   Hypokalemia Active Problems:   Hyponatremia   Cigarette smoker   Hypertension   Alcohol use disorder, severe, dependence (HCC)   Hypomagnesemia   Elevated troponin   COPD (chronic obstructive pulmonary disease) (HCC)   Diarrhea   Prolonged QT interval   Lung neoplasm   Hypokalemia/hypomagnesemia -repleted -due to diarrhea -recheck in AM  Alcohol withdrawal -CIWA- scheduled and PRN -may need phenobarbital addition if worsens    Hyponatremia Mildly decreased. -recheck in AM     Elevated troponin -check echo: Left ventricular ejection fraction, by estimation, is 40 to 45%. Left  ventricular ejection fraction by PLAX is 45 %. The left ventricle has  mildly decreased function. The left ventricle demonstrates global  hypokinesis. There is moderate left  ventricular hypertrophy of the basal-septal segment. Left ventricular  diastolic parameters are indeterminate.  -trending down -? T wave changes on EKG -outpatient cards follow up if she quits drinking  H/o seizures -keppra    COPD (chronic obstructive pulmonary disease) Breo substituted for advair. Albuterol as needed for SOB, cough     Diarrhea Pt has had diarrhea for past month.  GI: Diarrhea substantially improved, currently with semi-formed stools. Stool studies for infectious causes are negative. ESR, CRP were normal. Celiac  antibodies are pending. Calprotectin not sent. Continue Lomotil qid prn. Since diarrhea and appetite have improved we will defer further GI evaluation to the outpatient setting.    Mucous plug -s/p bronch BAL in RML -mucinex -flutter valve -nebs    Cigarette smoker Nicotine patch  -encourage cessation   PT eval to ensure safe enough to be d/c'd    Family Communication/Anticipated D/C date and plan/Code Status   DVT prophylaxis: Lovenox ordered. Code Status: Full Code.  Family Communication: caregiver at bedside Disposition Plan: Status is: Inpatient  Remains inpatient appropriate because: alcohol withdrawal         Medical Consultants:   GI Pccm     Subjective:   Says she is not able to cough up the mucous  Objective:    Vitals:   02/22/21 0000 02/22/21 0400 02/22/21 0722 02/22/21 1136  BP: (!) 109/54 122/61 130/73 139/64  Pulse: 97 84 90 96  Resp: _0 Temp: 98.2 F (36.8 C) 97.6 F (36.4 C) 98.2 F (36.8 C) 98.4 F (36.9 C)  TempSrc: Oral Oral Oral Oral  SpO2: 92% 93% 95% 97%  Weight:  64.5 kg    Height:        Intake/Output Summary (Last 24 hours) at 02/22/2021 1554 Last data filed at 02/22/2021 1330 Gross per 24 hour  Intake 1412.44 ml  Output 2225 ml  Net -812.56 ml   Filed Weights   02/20/21 0515 02/21/21 0504 02/22/21 0400  Weight: 64 kg 65.7 kg 64.5 kg    Exam:  General: Appearance:     Overweight female tremulous  Lungs:     respirations unlabored, wet sounding cough  Heart:    Normal heart rate.    MS:   All extremities are intact.    Neurologic:   Awake, less confused today     Data Reviewed:   I have personally reviewed following labs and imaging studies:  Labs: Labs show the following:   Basic Metabolic Panel: Recent Labs  Lab 02/19/21 1427 02/19/21 1457 02/20/21 0410 02/21/21 0413  NA 132*  --  134* 132*  K 3.0*  --  4.3 4.6  CL 93*  --  100 99  CO2 25  --  25 27  GLUCOSE 148*  --  106* 94   BUN 16  --  12 7*  CREATININE 1.00  --  0.82 0.74  CALCIUM 8.4*  --  8.0* 8.2*  MG  --  0.9* 2.1  --    GFR Estimated Creatinine Clearance: 53.6 mL/min (by C-G formula based on SCr of 0.74 mg/dL). Liver Function Tests: Recent Labs  Lab 02/19/21 1427 02/20/21 0410 02/21/21 0413  AST 57* 41 43*  ALT 35 28 29  ALKPHOS 71 63 60  BILITOT 0.7 0.7 0.3  PROT 5.9* 4.4* 4.3*  ALBUMIN 3.4* 2.6* 2.4*   Recent Labs  Lab 02/20/21 0410  LIPASE 70*   No results for input(s): AMMONIA in the last 168 hours. Coagulation profile Recent Labs  Lab 02/19/21 1427  INR 0.9    CBC: Recent Labs  Lab 02/19/21 1427 02/20/21 0410 02/21/21 0413  WBC 8.5 7.0 5.5  NEUTROABS 6.3  --   --   HGB 12.7 10.1* 9.8*  HCT 35.9* 29.8* 29.3*  MCV 101.1* 103.1* 105.4*  PLT 223 186 185   Cardiac Enzymes: No results for input(s): CKTOTAL, CKMB, CKMBINDEX, TROPONINI in the last 168 hours. BNP (last 3 results) No results for input(s): PROBNP in the last 8760 hours. CBG: No results for input(s): GLUCAP in the last 168 hours. D-Dimer: No results for input(s): DDIMER in the last 72 hours.  Hgb A1c: No results for input(s): HGBA1C in the last 72 hours. Lipid Profile: No results for input(s): CHOL, HDL, LDLCALC, TRIG, CHOLHDL, LDLDIRECT in the last 72 hours. Thyroid function studies: No results for input(s): TSH, T4TOTAL, T3FREE, THYROIDAB in the last 72 hours.  Invalid input(s): FREET3 Anemia work up: Recent Labs    02/21/21 0958  VITAMINB12 575  FOLATE 68.7  FERRITIN 163  TIBC 189*  IRON 57   Sepsis Labs: Recent Labs  Lab 02/19/21 1427 02/19/21 1457 02/19/21 1700 02/20/21 0410 02/21/21 0413  WBC 8.5  --   --  7.0 5.5  LATICACIDVEN  --  3.9* 1.3  --   --     Microbiology Recent Results (from the past 240 hour(s))  Resp Panel by RT-PCR (Flu A&B, Covid) Nasopharyngeal Swab     Status: None   Collection Time: 02/19/21  2:57 PM   Specimen: Nasopharyngeal Swab; Nasopharyngeal(NP)  swabs in vial transport medium  Result Value Ref Range Status   SARS Coronavirus 2 by RT PCR NEGATIVE NEGATIVE Final    Comment: (NOTE) SARS-CoV-2 target nucleic acids are NOT DETECTED.  The SARS-CoV-2 RNA is generally detectable in upper respiratory specimens during the acute phase of infection. The lowest concentration of SARS-CoV-2 viral copies this assay can detect is 138 copies/mL. A negative result does not preclude SARS-Cov-2 infection and should not be used as the sole basis for treatment or other patient management decisions. A negative result may  occur with  improper specimen collection/handling, submission of specimen other than nasopharyngeal swab, presence of viral mutation(s) within the areas targeted by this assay, and inadequate number of viral copies(<138 copies/mL). A negative result must be combined with clinical observations, patient history, and epidemiological information. The expected result is Negative.  Fact Sheet for Patients:  EntrepreneurPulse.com.au  Fact Sheet for Healthcare Providers:  IncredibleEmployment.be  This test is no t yet approved or cleared by the [place] FDA and  has been authorized for detection and/or diagnosis of SARS-CoV-2 by FDA under an Emergency Use Authorization (EUA). This EUA will remain  in effect (meaning this test can be used) for the duration of the COVID-19 declaration under Section 564(b)(1) of the Act, 21 U.S.C.section 360bbb-3(b)(1), unless the authorization is terminated  or revoked sooner.       Influenza A by PCR NEGATIVE NEGATIVE Final   Influenza B by PCR NEGATIVE NEGATIVE Final    Comment: (NOTE) The Xpert Xpress SARS-CoV-2/FLU/RSV plus assay is intended as an aid in the diagnosis of influenza from Nasopharyngeal swab specimens and should not be used as a sole basis for treatment. Nasal washings and aspirates are unacceptable for Xpert Xpress  SARS-CoV-2/FLU/RSV testing.  Fact Sheet for Patients: EntrepreneurPulse.com.au  Fact Sheet for Healthcare Providers: IncredibleEmployment.be  This test is not yet approved or cleared by the [place] FDA and has been authorized for detection and/or diagnosis of SARS-CoV-2 by FDA under an Emergency Use Authorization (EUA). This EUA will remain in effect (meaning this test can be used) for the duration of the COVID-19 declaration under Section 564(b)(1) of the Act, 21 U.S.C. section 360bbb-3(b)(1), unless the authorization is terminated or revoked.  Performed at [facility], [address] [place], [place] [address]   Culture, blood (Routine x 2)     Status: None (Preliminary result)   Collection Time: 02/19/21  3:55 PM   Specimen: BLOOD  Result Value Ref Range Status   Specimen Description   Final    BLOOD Blood Culture adequate volume Performed at [facility], [address] [place], [place] [address]    Special Requests   Final    AEROBIC BOTTLE ONLY BLOOD LEFT FOREARM Performed at [facility], [address] [place], [place] [address]    Culture   Final    NO GROWTH 3 DAYS Performed at [facility], [place] [address] [place], [place] [address]    Report Status PENDING  Incomplete  Culture, blood (Routine x 2)     Status: None (Preliminary result)   Collection Time: 02/19/21  5:00 PM   Specimen: BLOOD LEFT HAND  Result Value Ref Range Status   Specimen Description   Final    BLOOD LEFT HAND BLOOD Performed at [facility], [address] [place], [place] [address]    Special Requests   Final    Blood Culture results may not be optimal due to an inadequate volume of blood received in culture bottles [place] Performed at [facility], [address] [place], [place] [address]    Culture   Final    NO GROWTH 3 DAYS Performed at [facility], [place] [address] [place], [place] [address]    Report Status PENDING  Incomplete  Gastrointestinal Panel by PCR , Stool     Status: None   Collection Time: 02/19/21  7:45 PM   Specimen:  Stool  Result Value Ref Range Status   Campylobacter species NOT DETECTED NOT DETECTED Final   Plesimonas shigelloides NOT DETECTED NOT DETECTED Final   Salmonella species NOT DETECTED NOT DETECTED Final   Yersinia enterocolitica NOT DETECTED NOT DETECTED Final   Vibrio species NOT DETECTED NOT DETECTED Final   Vibrio cholerae NOT DETECTED NOT DETECTED Final   Enteroaggregative E coli (EAEC) NOT DETECTED NOT DETECTED Final   Enteropathogenic E coli (EPEC) NOT DETECTED NOT DETECTED Final   Enterotoxigenic E coli (ETEC) NOT DETECTED NOT DETECTED Final   Shiga like toxin producing E coli (STEC) NOT DETECTED NOT DETECTED Final   Shigella/Enteroinvasive E coli (EIEC) NOT DETECTED NOT DETECTED Final   Cryptosporidium NOT DETECTED NOT DETECTED Final   Cyclospora cayetanensis NOT DETECTED NOT DETECTED Final   Entamoeba histolytica NOT DETECTED NOT DETECTED Final   Giardia lamblia NOT DETECTED NOT DETECTED Final   Adenovirus F40/41 NOT DETECTED NOT DETECTED Final   Astrovirus NOT DETECTED NOT DETECTED Final   Norovirus GI/GII NOT DETECTED NOT DETECTED Final   Rotavirus A NOT DETECTED NOT DETECTED Final   Sapovirus (I, II, IV, and V) NOT DETECTED NOT DETECTED Final    Comment: Performed at [facility], [address] [place], [place] [address]  C Difficile Quick Screen w PCR reflex     Status: None   Collection Time: 02/19/21  7:45 PM   Specimen: Stool  Result Value Ref Range Status   C Diff antigen NEGATIVE NEGATIVE Final   C Diff toxin NEGATIVE NEGATIVE Final   C Diff interpretation No C. difficile detected.  Final    Comment: Performed at [facility], [place] [address] [place], [place] [address]  Aerobic/Anaerobic Culture w Gram Stain (surgical/deep wound)     Status: None  (Preliminary result)   Collection Time: 02/21/21 11:17 AM   Specimen: Bronchial Alveolar Lavage; Respiratory  Result Value Ref Range Status   Specimen Description BRONCHIAL ALVEOLAR LAVAGE  Final   Special Requests RML SPEC A  Final   Gram Stain NO WBC SEEN NO ORGANISMS SEEN   Final   Culture   Final    RARE Consistent with normal respiratory flora. Performed at [facility], [place] [address], [place], [place] [address]    Report Status PENDING  Incomplete    Procedures and diagnostic studies:  ECHOCARDIOGRAM COMPLETE  Result Date: 02/21/2021    ECHOCARDIOGRAM REPORT   Patient Name:   LAIYLA SLAGEL Date of Exam: 02/21/2021 Medical Rec #:  [id number]     Height:       62.0 in Accession #:    [id number]    Weight:       144.8 lb Date of Birth:  04/01/1945      BSA:          1.667 m Patient Age:    72 years      BP:           149/88 mmHg Patient Gender: F             HR:           98 bpm. Exam Location:  Inpatient Procedure: 2D Echo, Cardiac Doppler and Color Doppler Indications:    Elevated Troponin  History:        Patient has prior history of Echocardiogram examinations, most                 recent 03/27/2018. COPD; Risk Factors:Hypertension and Current  Smoker.  Sonographer:    Wenda Low Referring Phys: [phone] Laquinda Moller Alison Stalling  Sonographer Comments: Image acquisition challenging due to breast implants. IMPRESSIONS  1. Left ventricular ejection fraction, by estimation, is 40 to 45%. Left ventricular ejection fraction by PLAX is 45 %. The left ventricle has mildly decreased function. The left ventricle demonstrates global hypokinesis. There is moderate left ventricular hypertrophy of the basal-septal segment. Left ventricular diastolic parameters are indeterminate.  2. Right ventricular systolic function is normal. The right ventricular size is normal. There is normal pulmonary artery systolic pressure.  3. The mitral valve is normal in structure. No evidence of mitral valve  regurgitation. No evidence of mitral stenosis.  4. Tricuspid valve regurgitation is moderate.  5. The aortic valve is tricuspid. Aortic valve regurgitation is not visualized. No aortic stenosis is present.  6. The inferior vena cava is normal in size with greater than 50% respiratory variability, suggesting right atrial pressure of 3 mmHg. FINDINGS  Left Ventricle: Left ventricular ejection fraction, by estimation, is 40 to 45%. Left ventricular ejection fraction by PLAX is 45 %. The left ventricle has mildly decreased function. The left ventricle demonstrates global hypokinesis. The left ventricular internal cavity size was normal in size. There is moderate left ventricular hypertrophy of the basal-septal segment. Left ventricular diastolic parameters are indeterminate. Normal left ventricular filling pressure. Right Ventricle: The right ventricular size is normal. No increase in right ventricular wall thickness. Right ventricular systolic function is normal. There is normal pulmonary artery systolic pressure. The tricuspid regurgitant velocity is 2.62 m/s, and  with an assumed right atrial pressure of 3 mmHg, the estimated right ventricular systolic pressure is 03.4 mmHg. Left Atrium: Left atrial size was normal in size. Right Atrium: Right atrial size was normal in size. Pericardium: There is no evidence of pericardial effusion. Mitral Valve: The mitral valve is normal in structure. No evidence of mitral valve regurgitation. No evidence of mitral valve stenosis. MV peak gradient, 7.0 mmHg. The mean mitral valve gradient is 2.0 mmHg. Tricuspid Valve: The tricuspid valve is normal in structure. Tricuspid valve regurgitation is moderate . No evidence of tricuspid stenosis. Aortic Valve: The aortic valve is tricuspid. Aortic valve regurgitation is not visualized. No aortic stenosis is present. Aortic valve mean gradient measures 2.0 mmHg. Aortic valve peak gradient measures 3.7 mmHg. Aortic valve area, by VTI  measures 2.99 cm. Pulmonic Valve: The pulmonic valve was normal in structure. Pulmonic valve regurgitation is not visualized. No evidence of pulmonic stenosis. Aorta: The aortic root is normal in size and structure. Venous: The inferior vena cava is normal in size with greater than 50% respiratory variability, suggesting right atrial pressure of 3 mmHg. IAS/Shunts: No atrial level shunt detected by color flow Doppler.  LEFT VENTRICLE PLAX 2D LV EF:         Left            Diastology                ventricular     LV e' medial:    3.48 cm/s                ejection        LV E/e' medial:  6.6                fraction by     LV e' lateral:   5.77 cm/s                PLAX is 45  LV E/e' lateral: 4.0                %. LVIDd:         4.90 cm LVIDs:         3.80 cm LV PW:         0.90 cm LV IVS:        1.20 cm LVOT diam:     2.10 cm LV SV:         61 LV SV Index:   36 LVOT Area:     3.46 cm  LV Volumes (MOD) LV vol d, MOD    60.5 ml A4C: LV vol s, MOD    32.0 ml A4C: LV SV MOD A4C:   60.5 ml RIGHT VENTRICLE RV Basal diam:  3.30 cm RV Mid diam:    2.60 cm RV S prime:     20.90 cm/s TAPSE (M-mode): 1.8 cm LEFT ATRIUM             Index        RIGHT ATRIUM           Index LA diam:        3.20 cm 1.92 cm/m   RA Area:     14.60 cm LA Vol (A2C):   36.1 ml 21.66 ml/m  RA Volume:   37.80 ml  22.68 ml/m LA Vol (A4C):   33.2 ml 19.92 ml/m LA Biplane Vol: 36.8 ml 22.08 ml/m  AORTIC VALVE                    PULMONIC VALVE AV Area (Vmax):    2.83 cm     PV Vmax:       0.64 m/s AV Area (Vmean):   2.72 cm     PV Peak grad:  1.6 mmHg AV Area (VTI):     2.99 cm AV Vmax:           96.80 cm/s AV Vmean:          68.900 cm/s AV VTI:            0.203 m AV Peak Grad:      3.7 mmHg AV Mean Grad:      2.0 mmHg LVOT Vmax:         79.00 cm/s LVOT Vmean:        54.200 cm/s LVOT VTI:          0.175 m LVOT/AV VTI ratio: 0.86  AORTA Ao Root diam: 3.30 cm MITRAL VALVE                TRICUSPID VALVE MV Area (PHT): 5.66 cm     TR Peak grad:    27.5 mmHg MV Area VTI:   3.60 cm     TR Vmax:        262.00 cm/s MV Peak grad:  7.0 mmHg MV Mean grad:  2.0 mmHg     SHUNTS MV Vmax:       1.32 m/s     Systemic VTI:  0.18 m MV Vmean:      65.6 cm/s    Systemic Diam: 2.10 cm MV Decel Time: 134 msec MV E velocity: 23.10 cm/s MV A velocity: 127.00 cm/s MV E/A ratio:  0.18 Skeet Latch MD Electronically signed by Skeet Latch MD Signature Date/Time: 02/21/2021/3:35:26 PM    Final     Medications:    enoxaparin (LOVENOX) injection  40 mg Subcutaneous Daily   fluticasone furoate-vilanterol  1 puff Inhalation Daily   folic acid  1 mg Oral Daily   guaiFENesin  600 mg Oral BID   levETIRAcetam  1,000 mg Oral Q12H   lisinopril  5 mg Oral q morning   LORazepam  0-4 mg Intravenous Q8H   melatonin  10 mg Oral QHS   multivitamin with minerals  1 tablet Oral Daily   nicotine  14 mg Transdermal Daily   thiamine  100 mg Oral Daily   Or   thiamine  100 mg Intravenous Daily   Continuous Infusions:     LOS: 2 days   Geradine Girt  [facility]   How to contact the [facility] Attending or Consulting provider [place] or covering provider during after hours [place], for this patient?  Check the care team in [facility] and look for a) attending/consulting [facility] provider listed and b) the [facility] team listed Log into [email or web address] and use [place]'s universal password to access. If you do not have the password, please contact the hospital operator. Locate the [facility] provider you are looking for under [facility] and page to a number that you can be directly reached. If you still have difficulty reaching the provider, please page the [facility] (Director on Call) for the Hospitalists listed on amion for assistance.  02/22/2021, 3:54 PM

## 2021-02-22 NOTE — Evaluation (Signed)
Physical Therapy Evaluation Patient Details Name: Mindy Young MRN: [id number] DOB: 1946-01-22 Today's Date: 02/22/2021  History of Present Illness  76 yo female with onset of diarrhea was admitted 12/30 for management, noted low K+, lightheaded feelings, SOB, no falls, recent UTI and tachycardia with low magnesium.  Found a lung mass when she was cleared for PE.  Diarrhea is managed, referred to ck mobility.  PMHx: breast CA, EtOH use, COPD, HTN, smoker, diarrhea, UTI,  Clinical Impression  Pt was referred to PT for management of change in mobility since her admission with dx of new lung mass.  Pt is fragile and having uncontrolled BP for now.  Supine 144/78, sitting 171/123.  Work on her strength of LE's, balance skills and progression to walk and sit OOB to build her endurance.  Pt is appropriate for SNF due to her strength and balance changes that are making her unstable to stand, unable to walk currently with elevations in BP.  If pt can arrange help at home for all mobility and demonstrate controlled BP with mobility, she could potentially go there.  Given the multitude of symptoms, will recommend her to get SNF care for now.  Have increased frequency of visits in case pt turns down SNF care or does not qualify.     Recommendations for follow up therapy are one component of a multi-disciplinary discharge planning process, led by the attending physician.  Recommendations may be updated based on patient status, additional functional criteria and insurance authorization.  Follow Up Recommendations Skilled nursing-short term rehab (<3 hours/day)    Assistance Recommended at Discharge Frequent or constant Supervision/Assistance  Functional Status Assessment Patient has had a recent decline in their functional status and demonstrates the ability to make significant improvements in function in a reasonable and predictable amount of time.  Equipment Recommendations  Rolling walker (2 wheels)     Recommendations for Other Services       Precautions / Restrictions Precautions Precautions: Fall Precaution Comments: monitor O2 sat and HR, hypertensive Restrictions Weight Bearing Restrictions: No      Mobility  Bed Mobility Overal bed mobility: Needs Assistance Bed Mobility: Supine to Sit;Sit to Supine     Supine to sit: Min assist Sit to supine: Min assist   General bed mobility comments: min assist to support trunk out and to bring back to bed the legs    Transfers Overall transfer level: Needs assistance Equipment used: Rolling walker (2 wheels);1 person hand held assist Transfers: Sit to/from Stand Sit to Stand: Mod assist           General transfer comment: mod assist to power up and sidestep 3 steps only due to BP    Ambulation/Gait               General Gait Details: deferred  Stairs            Wheelchair Mobility    Modified Rankin (Stroke Patients Only)       Balance Overall balance assessment: Needs assistance Sitting-balance support: Feet supported Sitting balance-Leahy Scale: Fair     Standing balance support: Bilateral upper extremity supported;During functional activity Standing balance-Leahy Scale: Poor                               Pertinent Vitals/Pain Pain Assessment: Faces Faces Pain Scale: No hurt    Home Living Family/patient expects to be discharged to:: Private residence Living Arrangements: Alone Available  Help at Discharge: Family;Personal care attendant;Available PRN/intermittently Type of Home: House Home Access: Stairs to enter Entrance Stairs-Rails: Can reach both Entrance Stairs-Number of Steps: 5,6   Home Layout: One level Home Equipment: BSC/3in1;Shower seat;Wheelchair - Publishing copy (2 wheels);Cane - single point Additional Comments: pt reports she had an injury and had all the equipment from then    Prior Function Prior Level of Function : Needs assist        Physical Assist : Mobility (physical) Mobility (physical): Gait   Mobility Comments: has RW and SPC for mobility       Hand Dominance   Dominant Hand: Right    Extremity/Trunk Assessment   Upper Extremity Assessment Upper Extremity Assessment: Generalized weakness    Lower Extremity Assessment Lower Extremity Assessment: Generalized weakness    Cervical / Trunk Assessment Cervical / Trunk Assessment: Kyphotic (mild change)  Communication   Communication: No difficulties  Cognition Arousal/Alertness: Awake/alert Behavior During Therapy: Anxious Overall Cognitive Status: Within Functional Limits for tasks assessed Area of Impairment: Problem solving                             Problem Solving: Slow processing;Requires verbal cues;Requires tactile cues General Comments: pt is home with caregiver help every day for 5-6 hours, has a pet and caregiver assists wtih this as well        General Comments General comments (skin integrity, edema, etc.): pt had BP check on bed with finding 144/78, but sitting up was 171/123.  Deferred walking for now    Exercises     Assessment/Plan    PT Assessment Patient needs continued PT services  PT Problem List Decreased strength;Decreased range of motion;Decreased activity tolerance;Decreased balance;Decreased mobility;Decreased coordination;Decreased knowledge of use of DME;Decreased safety awareness;Cardiopulmonary status limiting activity       PT Treatment Interventions DME instruction;Gait training;Stair training;Functional mobility training;Therapeutic activities;Therapeutic exercise;Balance training;Neuromuscular re-education;Patient/family education    PT Goals (Current goals can be found in the Care Plan section)  Acute Rehab PT Goals Patient Stated Goal: to get home as soon as possible PT Goal Formulation: With patient Time For Goal Achievement: 03/08/21 Potential to Achieve Goals: Good    Frequency Min  3X/week   Barriers to discharge Decreased caregiver support caregiver help 6 hours a day    Co-evaluation               AM-PAC PT "6 Clicks" Mobility  Outcome Measure Help needed turning from your back to your side while in a flat bed without using bedrails?: A Lot           6 Click Score: 2    End of Session Equipment Utilized During Treatment: Gait belt Activity Tolerance: Patient limited by fatigue;Treatment limited secondary to medical complications (Comment) Patient left: in bed;with call bell/phone within reach;with bed alarm set Nurse Communication: Mobility status PT Visit Diagnosis: Unsteadiness on feet (R26.81);Muscle weakness (generalized) (M62.81)    Time: 9326-7124 PT Time Calculation (min) (ACUTE ONLY): 31 min   Charges:   PT Evaluation $PT Eval Moderate Complexity: 1 Mod PT Treatments $Therapeutic Activity: 8-22 mins       Ramond Dial 02/22/2021, 4:10 PM

## 2021-02-22 NOTE — Plan of Care (Signed)
°  Problem: Education: Goal: Knowledge of General Education information will improve Description: Including pain rating scale, medication(s)/side effects and non-pharmacologic comfort measures 02/22/2021 1727 by Serafina Mitchell, RN Outcome: Progressing 02/22/2021 1727 by Serafina Mitchell, RN Outcome: Progressing   Problem: Health Behavior/Discharge Planning: Goal: Ability to manage health-related needs will improve 02/22/2021 1727 by Serafina Mitchell, RN Outcome: Progressing 02/22/2021 1727 by Serafina Mitchell, RN Outcome: Progressing   Problem: Clinical Measurements: Goal: Ability to maintain clinical measurements within normal limits will improve 02/22/2021 1727 by Serafina Mitchell, RN Outcome: Progressing 02/22/2021 1727 by Serafina Mitchell, RN Outcome: Progressing Goal: Will remain free from infection 02/22/2021 1727 by Serafina Mitchell, RN Outcome: Progressing 02/22/2021 1727 by Serafina Mitchell, RN Outcome: Progressing Goal: Diagnostic test results will improve 02/22/2021 1727 by Serafina Mitchell, RN Outcome: Progressing 02/22/2021 1727 by Lora Paula D, RN Outcome: Progressing Goal: Respiratory complications will improve 02/22/2021 1727 by Serafina Mitchell, RN Outcome: Progressing 02/22/2021 1727 by Lora Paula D, RN Outcome: Progressing Goal: Cardiovascular complication will be avoided 02/22/2021 1727 by Serafina Mitchell, RN Outcome: Progressing 02/22/2021 1727 by Serafina Mitchell, RN Outcome: Progressing   Problem: Activity: Goal: Risk for activity intolerance will decrease 02/22/2021 1727 by Serafina Mitchell, RN Outcome: Progressing 02/22/2021 1727 by Lora Paula D, RN Outcome: Progressing   Problem: Nutrition: Goal: Adequate nutrition will be maintained 02/22/2021 1727 by Serafina Mitchell, RN Outcome: Progressing 02/22/2021 1727 by Lora Paula D, RN Outcome: Progressing   Problem: Coping: Goal: Level of anxiety will  decrease 02/22/2021 1727 by Serafina Mitchell, RN Outcome: Progressing 02/22/2021 1727 by Serafina Mitchell, RN Outcome: Progressing   Problem: Elimination: Goal: Will not experience complications related to bowel motility 02/22/2021 1727 by Serafina Mitchell, RN Outcome: Progressing 02/22/2021 1727 by Serafina Mitchell, RN Outcome: Progressing Goal: Will not experience complications related to urinary retention 02/22/2021 1727 by Serafina Mitchell, RN Outcome: Progressing 02/22/2021 1727 by Serafina Mitchell, RN Outcome: Progressing   Problem: Pain Managment: Goal: General experience of comfort will improve 02/22/2021 1727 by Serafina Mitchell, RN Outcome: Progressing 02/22/2021 1727 by Serafina Mitchell, RN Outcome: Progressing   Problem: Safety: Goal: Ability to remain free from injury will improve 02/22/2021 1727 by Serafina Mitchell, RN Outcome: Progressing 02/22/2021 1727 by Lora Paula D, RN Outcome: Progressing   Problem: Skin Integrity: Goal: Risk for impaired skin integrity will decrease 02/22/2021 1727 by Serafina Mitchell, RN Outcome: Progressing 02/22/2021 1727 by Lora Paula D, RN Outcome: Progressing   Problem: Skin Integrity: Goal: Risk for impaired skin integrity will decrease 02/22/2021 1727 by Serafina Mitchell, RN Outcome: Progressing 02/22/2021 1727 by Serafina Mitchell, RN Outcome: Progressing

## 2021-02-22 NOTE — Progress Notes (Signed)
Mobility Specialist Progress Note:   02/22/21 1600  Mobility  Activity Ambulated in hall  Level of Assistance Independent after set-up  Assistive Device Front wheel walker  Distance Ambulated (ft) 200 ft  Mobility Ambulated with assistance in hallway  Mobility Response Tolerated well  Mobility performed by Mobility specialist  $Mobility charge 1 Mobility   Pt requesting to ambulate in hallway. No complaints of pain. Pt returned to bed with call bell in reach and all needs met.   [facility] Public librarian Phone [phone] Secondary Phone [phone]

## 2021-02-22 NOTE — Progress Notes (Signed)
02/22/2021   I have seen and evaluated the patient for abnormal imaging.  S:  No events, wants to go home. Diarrhea improved  O: Blood pressure 139/64, pulse 96, temperature 98.4 F (36.9 C), temperature source Oral, resp. rate 15, height _0  (1.575 m), weight 64.5 kg, SpO2 97 %.  No distress Abdomen soft Lungs diminished but clear Ext warm, moves all 4 ext Anxious but better than yesterday  A:  Mucus plugging of RML likely related to chronic bronchitis and alcoholism leading to poor mucociliary clearance.  P:  - Encourage mobility, can send home with flutter - I will call her if anything grows from her BAL that requires treatment - PCCM available PRN   Erskine Emery MD [facility] Prefer epic messenger for cross cover needs If after hours, please call E-link

## 2021-02-22 NOTE — Progress Notes (Signed)
Mobility Specialist Progress Note:   02/22/21 1524  Mobility  Activity Ambulated in room  Level of Assistance Independent after set-up  Assistive Device Front wheel walker  Distance Ambulated (ft) 50 ft  Mobility Ambulated with assistance in room  Mobility Response Tolerated well  Mobility performed by Mobility specialist  Bed Position Chair  $Mobility charge 1 Mobility   Pt received in bed willing to ambulate in room. No complaints of pain. Pt left in chair with call bell in reach and all needs met.   [facility] Public librarian Phone [phone] Secondary Phone [phone]

## 2021-02-22 NOTE — Progress Notes (Signed)
PT Cancellation Note  Patient Details Name: Mindy Young MRN: [id number] DOB: 07/02/1945   Cancelled Treatment:    Reason Eval/Treat Not Completed: Other (comment).  Will try after her meal.  Follow up as time and pt allow.   Ramond Dial 02/22/2021, 12:03 PM  Mee Hives, PT PhD Acute Rehab Dept. Number: [place] [facility] [place]

## 2021-02-23 DIAGNOSIS — E876 Hypokalemia: Secondary | ICD-10-CM | POA: Diagnosis not present

## 2021-02-23 LAB — BASIC METABOLIC PANEL
Anion gap: 11 (ref 5–15)
BUN: <5 mg/dL — ABNORMAL LOW (ref 8–23)
CO2: 26 mmol/L (ref 22–32)
Calcium: 8.4 mg/dL — ABNORMAL LOW (ref 8.9–10.3)
Chloride: 97 mmol/L — ABNORMAL LOW (ref 98–111)
Creatinine, Ser: 0.65 mg/dL (ref 0.44–1.00)
GFR, Estimated: >60 mL/min (ref >60)
Glucose, Bld: 92 mg/dL (ref 70–99)
Potassium: 3.6 mmol/L (ref 3.5–5.1)
Sodium: 134 mmol/L — ABNORMAL LOW (ref 135–145)

## 2021-02-23 LAB — CBC
HCT: 31.8 % — ABNORMAL LOW (ref 36.0–46.0)
Hemoglobin: 10.7 g/dL — ABNORMAL LOW (ref 12.0–15.0)
MCH: 34.9 pg — ABNORMAL HIGH (ref 26.0–34.0)
MCHC: 33.6 g/dL (ref 30.0–36.0)
MCV: 103.6 fL — ABNORMAL HIGH (ref 80.0–100.0)
Platelets: 234 10*3/uL (ref 150–400)
RBC: 3.07 MIL/uL — ABNORMAL LOW (ref 3.87–5.11)
RDW: 13.2 % (ref 11.5–15.5)
WBC: 4.6 10*3/uL (ref 4.0–10.5)
nRBC: 0 % (ref 0.0–0.2)

## 2021-02-23 LAB — GLIA (IGA/G) + TTG IGA
Deamidated Gliadin Abs, IgA: 2 U (ref 0–19)
Deamidated Gliadin Abs, IgG: 1 U (ref 0–19)
Tissue Transglutaminase Ab, IgA: <2 U/mL (ref 0–3)

## 2021-02-23 MED ORDER — LORAZEPAM 1 MG PO TABS: 1.0000 mg | ORAL_TABLET | ORAL | Status: DC | PRN

## 2021-02-23 MED ORDER — LORAZEPAM 2 MG/ML IJ SOLN: 1.0000 mg | INTRAMUSCULAR | Status: DC | PRN

## 2021-02-23 MED ORDER — FOLIC ACID 1 MG PO TABS: 1.0000 mg | ORAL_TABLET | Freq: Every day | ORAL | 0 refills | Status: AC

## 2021-02-23 MED ORDER — GUAIFENESIN ER 600 MG PO TB12: 600.0000 mg | ORAL_TABLET | Freq: Two times a day (BID) | ORAL | 0 refills | Status: DC

## 2021-02-23 MED ORDER — THIAMINE HCL 100 MG PO TABS: 100.0000 mg | ORAL_TABLET | Freq: Every day | ORAL | 0 refills | Status: AC

## 2021-02-23 MED ORDER — PHENOBARBITAL 32.4 MG PO TABS
32.4000 mg | ORAL_TABLET | Freq: Two times a day (BID) | ORAL | Status: DC
  Administered 2021-02-23: 32.4 mg via ORAL
  Filled 2021-02-23: qty 1

## 2021-02-23 MED ORDER — DIPHENOXYLATE-ATROPINE 2.5-0.025 MG PO TABS: 1.0000 | ORAL_TABLET | Freq: Four times a day (QID) | ORAL | 0 refills | Status: AC | PRN

## 2021-02-23 MED ORDER — PHENOBARBITAL 32.4 MG PO TABS: 32.4000 mg | ORAL_TABLET | Freq: Two times a day (BID) | ORAL | 0 refills | Status: DC

## 2021-02-23 NOTE — Progress Notes (Signed)
Discharge instructions (including medications) discussed with and copy provided to patient/caregiver 

## 2021-02-23 NOTE — TOC Initial Note (Signed)
Transition of Care [facility]) - Initial/Assessment Note    Patient Details  Name: Mindy Young MRN: [id number] Date of Birth: [birth year]  [facility]) CM/SW Contact:    Zenon Mayo, RN Phone Number: 02/23/2021, 2:13 PM  Clinical Narrative:                 Patient is for dc today,NCM notified by CSW that she is refusing SNF, NCM asked patient if she wanted to be set up with [facility] services. She states she has a caregiver  and does not feel she needs therapy.  She states she has walker, w/chair and cane at home. [facility] at [facility], follow up apt has been made.    Expected Discharge Plan: [place] Barriers to Discharge: No Barriers Identified   Patient Goals and CMS Choice Patient states their goals for this hospitalization and ongoing recovery are:: return home CMS Medicare.gov Compare Post Acute Care list provided to:: Patient Choice offered to / list presented to : Patient  Expected Discharge Plan and Services Expected Discharge Plan: [place] In-house Referral: Clinical Social Work Discharge Planning Services: CM Consult Post Acute Care Choice: NA Living arrangements for the past 2 months: Single Family Home Expected Discharge Date: 02/23/21                 DME Agency: NA       HH Arranged: Refused HH          Prior Living Arrangements/Services Living arrangements for the past 2 months: Single Family Home Lives with:: Self Patient language and need for interpreter reviewed:: Yes Do you feel safe going back to the place where you live?: Yes      Need for Family Participation in Patient Care: Yes (Comment) Care giver support system in place?: Yes (comment) Current home services: Homehealth aide (caregiver/aide) Criminal Activity/Legal Involvement Pertinent to Current Situation/Hospitalization: No - Comment as needed  Activities of Daily Living Home Assistive Devices/Equipment: None ADL Screening (condition  at time of admission) Patient's cognitive ability adequate to safely complete daily activities?: Yes Is the patient deaf or have difficulty hearing?: No Does the patient have difficulty seeing, even when wearing glasses/contacts?: No Does the patient have difficulty concentrating, remembering, or making decisions?: No Patient able to express need for assistance with ADLs?: Yes Does the patient have difficulty dressing or bathing?: No Independently performs ADLs?: Yes (appropriate for developmental age) Does the patient have difficulty walking or climbing stairs?: No Weakness of Legs: None Weakness of Arms/Hands: None  Permission Sought/Granted Permission sought to share information with : Family Supports Permission granted to share information with : Yes, Verbal Permission Granted  Share Information with NAME: Karel Jarvis (Other)   [phone]  Permission granted to share info w AGENCY: No  Permission granted to share info w Relationship: Karel Jarvis (Other)   [phone]  Permission granted to share info w Contact Information: Karel Jarvis (Other)   [phone]  Emotional Assessment Appearance:: Appears stated age Attitude/Demeanor/Rapport: Engaged Affect (typically observed): Appropriate Orientation: : Oriented to  Time, Oriented to Situation, Oriented to Place, Oriented to Self Alcohol / Substance Use: Not Applicable Psych Involvement: No (comment)  Admission diagnosis:  Hypokalemia [E87.6] Hypomagnesemia [E83.42] Elevated troponin [R77.8] Diarrhea [R19.7] Patient Active Problem List   Diagnosis Date Noted   Hypomagnesemia 02/20/2021   Elevated troponin 02/20/2021   COPD (chronic obstructive pulmonary disease) ([place]) 02/20/2021   Diarrhea 02/20/2021  Prolonged QT interval 02/20/2021   Lung neoplasm 02/20/2021   Alcohol use disorder, severe, in controlled environment ([place]) 12/29/2020   Alcohol use disorder, severe, dependence ([place]) 12/29/2020   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 ([place]) 12/29/2020   Alcohol dependence with uncomplicated intoxication ([place])    Clavicle fracture 10/29/2020   Rib fracture 10/29/2020   Hypokalemia 06/14/2020   Seizure ([place]) 06/13/2020   Impingement syndrome of left shoulder 03/27/2019   Pain in left shoulder 02/21/2019   Rib pain on right side 02/21/2019   Low back pain 02/07/2019   GERD (gastroesophageal reflux disease) 12/09/2018   Anxiety 12/09/2018   Difficulty speaking 12/09/2018   Leukocytosis 12/09/2018   Seizure-like activity ([place]) 12/09/2018   Pelvic fracture ([place]) 11/25/2018   Pubic bone fracture ([place]) 11/24/2018   Left knee pain 03/29/2018   Unilateral primary osteoarthritis, left knee 03/29/2018   Genetic testing 01/24/2018   Family history of breast cancer    Family history of prostate cancer    Malignant neoplasm of lower-inner quadrant of right breast of female, estrogen receptor positive ([place]) 10/24/2017   Anemia 06/29/2011   Hypertension    Hyponatremia 06/25/2011   Transaminitis 06/25/2011   Alcohol abuse 06/25/2011   Cough 06/25/2011   Cigarette smoker 06/25/2011   PCP:  Cipriano Mile, NP Pharmacy:   [facility], [place] [address] AT [address] [place] [address] Phone: [phone] Fax: [phone]     Social Determinants of Health (SDOH) Interventions    Readmission Risk Interventions Readmission Risk Prevention Plan 02/23/2021  Transportation Screening Complete  Medication Review ([place]) Complete  PCP or Specialist appointment within 3-5 days of discharge Complete  HRI or [place] Complete  SW Recovery Care/Counseling Consult Complete  [place] Patient Refused  Some recent data might be hidden

## 2021-02-23 NOTE — TOC Transition Note (Addendum)
Transition of Care [facility]) - CM/SW Discharge Note   Patient Details  Name: Mindy Young MRN: [id number] Date of Birth: 07/30/1945  Transition of [facility]) CM/SW Contact:  Zenon Mayo, RN Phone Number: 02/23/2021, 2:17 PM   Clinical Narrative:    Patient is for dc today,NCM notified by CSW that she is refusing SNF, NCM asked patient if she wanted to be set up with [facility] services. She states she has a caregiver  and does not feel she needs therapy.  She states she has walker, w/chair and cane at home. Her PCP is at [facility], follow up apt has been made.  NCM gave her SA resources.     Final next level of care: Home/Self Care Barriers to Discharge: No Barriers Identified   Patient Goals and CMS Choice Patient states their goals for this hospitalization and ongoing recovery are:: return home CMS Medicare.gov Compare Post Acute Care list provided to:: Patient Choice offered to / list presented to : Patient  Discharge Placement                       Discharge Plan and Services In-house Referral: Clinical Social Work Discharge Planning Services: CM Consult Post Acute Care Choice: NA            DME Agency: NA       HH Arranged: Refused HH          Social Determinants of Health (SDOH) Interventions     Readmission Risk Interventions Readmission Risk Prevention Plan 02/23/2021  Transportation Screening Complete  Medication Review [employer]) Complete  PCP or Specialist appointment within 3-5 days of discharge Complete  HRI or [employer] Complete  SW Recovery Care/Counseling Consult Complete  [place] Patient Refused  Some recent data might be hidden

## 2021-02-23 NOTE — Discharge Summary (Signed)
Physician Discharge Summary  Mindy Young KXF:[id number] DOB: [birth year] DOA: 02/19/2021  PCP: Mindy Mile, NP  Admit date: 02/19/2021 Discharge date: 02/23/2021  Admitted From: home Discharge disposition: home   Recommendations for Outpatient Follow-Up:   Not interested in rehab for alcohol abuse Decline home health Bmp 1 week GI follow up Flutter valve Outpatient cards referral   Discharge Diagnosis:   Principal Problem:   Hypokalemia Active Problems:   Hyponatremia   Cigarette smoker   Hypertension   Alcohol use disorder, severe, dependence (HCC)   Hypomagnesemia   Elevated troponin   COPD (chronic obstructive pulmonary disease) (HCC)   Diarrhea   Prolonged QT interval   Lung neoplasm    Discharge Condition: Improved.  Diet recommendation: Low sodium, heart healthy.  Wound care: None.  Code status: Full.   History of Present Illness:   Mindy Young is a 76 y.o. female with medical history significant for Breast cancer, alcohol abuse, COPD, HTN who presents for evaluation of dehydration and diarrhea.  She reports she has been having diarrhea every day for the last month.  She reports having 5-6 nonbloody episodes of loose stool daily.  She has had decreased p.o. intake for the last week.  She reports feeling lightheaded and just generalized fatigue when she stands up but denies having any falls or syncopal episodes.  She has shortness of breath with exertion which is chronic for her which she relates to her COPD.  She denies any chest pain or palpitations.  She has not had any fevers or chills.  She had a UTI that was treated with an antibiotic a few weeks ago.  She reports no urinary symptoms at this time.  She reports has been smoking more than normal lately.  She has a 60-pack-year history of smoking but has been trying to quit recently and was down to approximately half a pack of cigarettes a day.  For the last 2 to 3 weeks she has been back to  smoking a pack of cigarettes a day.  She does admit to drinking the hall daily.  She states that she drinks "way too much" stating she has bourbon every night.  She states that when she does try to quit drinking within a few days she becomes very tremulous and feels bad.  She expresses desire to quit alcohol use.   Hospital Course by Problem:   Hypokalemia/hypomagnesemia -repleted -due to diarrhea   Alcohol withdrawal -CIWA- scheduled and PRN -discussed with critical care: phenobarbital x3 days     Hyponatremia Mildly decreased. -stable     Elevated troponin -check echo: Left ventricular ejection fraction, by estimation, is 40 to 45%. Left  ventricular ejection fraction by PLAX is 45 %. The left ventricle has  mildly decreased function. The left ventricle demonstrates global  hypokinesis. There is moderate left  ventricular hypertrophy of the basal-septal segment. Left ventricular  diastolic parameters are indeterminate.  -trending down -? T wave changes on EKG -outpatient cards follow up if she quits drinking   H/o seizures -keppra     COPD (chronic obstructive pulmonary disease) Breo substituted for advair. Albuterol as needed for SOB, cough     Diarrhea Pt has had diarrhea for past month.  GI: Diarrhea substantially improved, currently with semi-formed stools. Stool studies for infectious causes are negative. ESR, CRP were normal. Celiac antibodies are pending. Calprotectin not sent. Continue Lomotil qid prn. Since diarrhea and appetite have improved we will defer further GI  evaluation to the outpatient setting.    Mucous plug -s/p bronch BAL in RML -mucinex -flutter valve -nebs     Cigarette smoker Nicotine patch  -encourage cessation    Medical Consultants:   GI PCCM   Discharge Exam:   Vitals:   02/23/21 0742 02/23/21 1154  BP: 121/72 (!) 143/89  Pulse: 92 (!) 107  Resp: 15 16  Temp: 97.6 F (36.4 C) (!) 97.5 F (36.4 C)  SpO2: 94% 96%    Vitals:   02/22/21 1924 02/23/21 0412 02/23/21 0742 02/23/21 1154  BP: 131/65 105/73 121/72 (!) 143/89  Pulse: 100 93 92 (!) 107  Resp:  _0 Temp: 98.4 F (36.9 C) 98.1 F (36.7 C) 97.6 F (36.4 C) (!) 97.5 F (36.4 C)  TempSrc: Oral Oral Oral Oral  SpO2: 96% 98% 94% 96%  Weight:  62.1 kg    Height:        General exam: Appears calm and comfortable.     The results of significant diagnostics from this hospitalization (including imaging, microbiology, ancillary and laboratory) are listed below for reference.     Procedures and Diagnostic Studies:   CT Angio Chest PE W and/or Wo Contrast  Result Date: 02/19/2021 CLINICAL DATA:  Provided indication is diarrhea for 2 weeks, some lightheadedness. History of breast carcinoma. EXAM: CT ANGIOGRAPHY CHEST WITH CONTRAST TECHNIQUE: Multidetector CT imaging of the chest was performed using the standard protocol during bolus administration of intravenous contrast. Multiplanar CT image reconstructions and MIPs were obtained to evaluate the vascular anatomy. CONTRAST:  161m OMNIPAQUE IOHEXOL 350 MG/ML SOLN COMPARISON:  11/06/2020 FINDINGS: Cardiovascular: Pulmonary arteries are well opacified. There is no evidence of a pulmonary embolism. Heart is normal in size and configuration. No pericardial effusion. Great vessels are normal in caliber. Aortic atherosclerosis. No dissection. 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 Mediastinum/Nodes: No enlarged mediastinal, hilar, or axillary lymph nodes. Thyroid gland, trachea, and esophagus demonstrate no significant findings. Lungs/Pleura: There is soft tissue at the origin of the right middle lobe bronchi, narrowing the distal bronchus intermedius in proximal right lower lobe bronchus. The middle lobe bronchi are occluded and the right middle lobe is collapsed. Moderate to advanced centrilobular emphysema is stable from the prior CT. Minor linear opacities are noted in the lower lobes  and at the base of the left upper lobe lingula consistent with atelectasis and/or scarring. Stable pleuroparenchymal scarring at the right apex. Remainder of the lungs is clear. No pleural effusion or pneumothorax. Upper Abdomen: No acute abnormality. Musculoskeletal: Mild depression of the upper endplate of T5, stable from the prior CT. No acute fracture. No bone lesion. Bilateral retropectoral breast implants.  No chest wall mass. Review of the MIP images confirms the above findings. IMPRESSION: 1. No evidence of a pulmonary embolism. 2. Collapsed right middle lobe that appears due to obstruction at the origin of the right middle lobe bronchi. This may be due to a mass or mucous plugging. Nonemergent follow-up endoscopy recommended. 3. No acute findings within the chest. 4. Moderate to advanced emphysema.  Aortic atherosclerosis. Aortic Atherosclerosis (ICD10-I70.0) and Emphysema (ICD10-J43.9). Electronically Signed   By: DLajean ManesM.D.   On: 02/19/2021 20:28   DG Chest Portable 1 View  Result Date: 02/19/2021 CLINICAL DATA:  Weakness. EXAM: PORTABLE CHEST 1 VIEW COMPARISON:  December 26, 2020. FINDINGS: The heart size and mediastinal contours are within normal limits. Both lungs are clear. The visualized skeletal structures are unremarkable. IMPRESSION: No active disease.  Electronically Signed   By: Marijo Conception M.D.   On: 02/19/2021 14:58     Labs:   Basic Metabolic Panel: Recent Labs  Lab 02/19/21 1427 02/19/21 1457 02/20/21 0410 02/21/21 0413 02/23/21 0345  NA 132*  --  134* 132* 134*  K 3.0*  --  4.3 4.6 3.6  CL 93*  --  100 99 97*  CO2 25  --  _0 GLUCOSE 148*  --  106* 94 92  BUN 16  --  12 7* <5*  CREATININE 1.00  --  0.82 0.74 0.65  CALCIUM 8.4*  --  8.0* 8.2* 8.4*  MG  --  0.9* 2.1  --   --    GFR Estimated Creatinine Clearance: 52.7 mL/min (by C-G formula based on SCr of 0.65 mg/dL). Liver Function Tests: Recent Labs  Lab 02/19/21 1427 02/20/21 0410  02/21/21 0413  AST 57* 41 43*  ALT 35 28 29  ALKPHOS 71 63 60  BILITOT 0.7 0.7 0.3  PROT 5.9* 4.4* 4.3*  ALBUMIN 3.4* 2.6* 2.4*   Recent Labs  Lab 02/20/21 0410  LIPASE 70*   No results for input(s): AMMONIA in the last 168 hours. Coagulation profile Recent Labs  Lab 02/19/21 1427  INR 0.9    CBC: Recent Labs  Lab 02/19/21 1427 02/20/21 0410 02/21/21 0413 02/23/21 0345  WBC 8.5 7.0 5.5 4.6  NEUTROABS 6.3  --   --   --   HGB 12.7 10.1* 9.8* 10.7*  HCT 35.9* 29.8* 29.3* 31.8*  MCV 101.1* 103.1* 105.4* 103.6*  PLT 223 186 185 234   Cardiac Enzymes: No results for input(s): CKTOTAL, CKMB, CKMBINDEX, TROPONINI in the last 168 hours. BNP: Invalid input(s): POCBNP CBG: No results for input(s): GLUCAP in the last 168 hours. D-Dimer No results for input(s): DDIMER in the last 72 hours. Hgb A1c No results for input(s): HGBA1C in the last 72 hours. Lipid Profile No results for input(s): CHOL, HDL, LDLCALC, TRIG, CHOLHDL, LDLDIRECT in the last 72 hours. Thyroid function studies No results for input(s): TSH, T4TOTAL, T3FREE, THYROIDAB in the last 72 hours.  Invalid input(s): FREET3 Anemia work up Recent Labs    02/21/21 0958  VITAMINB12 575  FOLATE 68.7  FERRITIN 163  TIBC 189*  IRON 57   Microbiology Recent Results (from the past 240 hour(s))  Resp Panel by RT-PCR (Flu A&B, Covid) Nasopharyngeal Swab     Status: None   Collection Time: 02/19/21  2:57 PM   Specimen: Nasopharyngeal Swab; Nasopharyngeal(NP) swabs in vial transport medium  Result Value Ref Range Status   SARS Coronavirus 2 by RT PCR NEGATIVE NEGATIVE Final    Comment: (NOTE) SARS-CoV-2 target nucleic acids are NOT DETECTED.  The SARS-CoV-2 RNA is generally detectable in upper respiratory specimens during the acute phase of infection. The lowest concentration of SARS-CoV-2 viral copies this assay can detect is 138 copies/mL. A negative result does not preclude SARS-Cov-2 infection and should  not be used as the sole basis for treatment or other patient management decisions. A negative result may occur with  improper specimen collection/handling, submission of specimen other than nasopharyngeal swab, presence of viral mutation(s) within the areas targeted by this assay, and inadequate number of viral copies(<138 copies/mL). A negative result must be combined with clinical observations, patient history, and epidemiological information. The expected result is Negative.  Fact Sheet for Patients:  EntrepreneurPulse.com.au  Fact Sheet for Healthcare Providers:  IncredibleEmployment.be  This test is no t yet  approved or cleared by the [place] and  has been authorized for detection and/or diagnosis of SARS-CoV-2 by FDA under an Emergency Use Authorization (EUA). This EUA will remain  in effect (meaning this test can be used) for the duration of the COVID-19 declaration under Section 564(b)(1) of the Act, 21 U.S.C.section 360bbb-3(b)(1), unless the authorization is terminated  or revoked sooner.       Influenza A by PCR NEGATIVE NEGATIVE Final   Influenza B by PCR NEGATIVE NEGATIVE Final    Comment: (NOTE) The Xpert Xpress SARS-CoV-2/FLU/RSV plus assay is intended as an aid in the diagnosis of influenza from Nasopharyngeal swab specimens and should not be used as a sole basis for treatment. Nasal washings and aspirates are unacceptable for Xpert Xpress SARS-CoV-2/FLU/RSV testing.  Fact Sheet for Patients: EntrepreneurPulse.com.au  Fact Sheet for Healthcare Providers: IncredibleEmployment.be  This test is not yet approved or cleared by the [place] FDA and has been authorized for detection and/or diagnosis of SARS-CoV-2 by FDA under an Emergency Use Authorization (EUA). This EUA will remain in effect (meaning this test can be used) for the duration of the COVID-19 declaration under  Section 564(b)(1) of the Act, 21 U.S.C. section 360bbb-3(b)(1), unless the authorization is terminated or revoked.  Performed at [facility], [address] [place], [place] [address]   Culture, blood (Routine x 2)     Status: None (Preliminary result)   Collection Time: 02/19/21  3:55 PM   Specimen: BLOOD  Result Value Ref Range Status   Specimen Description   Final    BLOOD Blood Culture adequate volume Performed at [facility], [address] [place], [place] [address]    Special Requests   Final    AEROBIC BOTTLE ONLY BLOOD LEFT FOREARM Performed at [facility], [address] [place], [place] [address]    Culture   Final    NO GROWTH 4 DAYS Performed at [facility], [place] [address] [place], [place] [address]    Report Status PENDING  Incomplete  Culture, blood (Routine x 2)     Status: None (Preliminary result)   Collection Time: 02/19/21  5:00 PM   Specimen: BLOOD LEFT HAND  Result Value Ref Range Status   Specimen Description   Final    BLOOD LEFT HAND BLOOD Performed at [facility], [address] [place], [place] [address]    Special Requests   Final    Blood Culture results may not be optimal due to an inadequate volume of blood received in culture bottles [place] Performed at [facility], [address], [place], [place] [address]    Culture   Final    NO GROWTH 4 DAYS Performed at [facility], [place] [address] [place], [place] [address]    Report Status PENDING  Incomplete  Gastrointestinal Panel by PCR , Stool     Status: None   Collection Time: 02/19/21  7:45 PM   Specimen: Stool  Result Value Ref Range Status   Campylobacter species NOT DETECTED NOT DETECTED Final   Plesimonas shigelloides NOT DETECTED NOT DETECTED Final   Salmonella species NOT DETECTED NOT DETECTED Final   Yersinia enterocolitica NOT DETECTED NOT DETECTED Final   Vibrio species NOT DETECTED  NOT DETECTED Final   Vibrio cholerae NOT DETECTED NOT DETECTED Final   Enteroaggregative E coli (EAEC) NOT DETECTED NOT DETECTED Final  Enteropathogenic E coli (EPEC) NOT DETECTED NOT DETECTED Final   Enterotoxigenic E coli (ETEC) NOT DETECTED NOT DETECTED Final   Shiga like toxin producing E coli (STEC) NOT DETECTED NOT DETECTED Final   Shigella/Enteroinvasive E coli (EIEC) NOT DETECTED NOT DETECTED Final   Cryptosporidium NOT DETECTED NOT DETECTED Final   Cyclospora cayetanensis NOT DETECTED NOT DETECTED Final   Entamoeba histolytica NOT DETECTED NOT DETECTED Final   Giardia lamblia NOT DETECTED NOT DETECTED Final   Adenovirus F40/41 NOT DETECTED NOT DETECTED Final   Astrovirus NOT DETECTED NOT DETECTED Final   Norovirus GI/GII NOT DETECTED NOT DETECTED Final   Rotavirus A NOT DETECTED NOT DETECTED Final   Sapovirus (I, II, IV, and V) NOT DETECTED NOT DETECTED Final    Comment: Performed at [facility], [address] [place], [place] [address]  C Difficile Quick Screen w PCR reflex     Status: None   Collection Time: 02/19/21  7:45 PM   Specimen: Stool  Result Value Ref Range Status   C Diff antigen NEGATIVE NEGATIVE Final   C Diff toxin NEGATIVE NEGATIVE Final   C Diff interpretation No C. difficile detected.  Final    Comment: Performed at [facility], [place] [address], [place], [place] [address]  Aerobic/Anaerobic Culture w Gram Stain (surgical/deep wound)     Status: None (Preliminary result)   Collection Time: 02/21/21 11:17 AM   Specimen: Bronchial Alveolar Lavage; Respiratory  Result Value Ref Range Status   Specimen Description BRONCHIAL ALVEOLAR LAVAGE  Final   Special Requests RML SPEC A  Final   Gram Stain NO WBC SEEN NO ORGANISMS SEEN   Final   Culture   Final    RARE Consistent with normal respiratory flora. Performed at [facility], [place] [address], [place], [place] [address]    Report Status PENDING  Incomplete     Discharge  Instructions:   Discharge Instructions     Diet - low sodium heart healthy   Complete by: As directed    Discharge instructions   Complete by: As directed    Alcohol cessation Continue flutter valve GI follow up with Dr. Silverio Decamp in 2 weeks.   Increase activity slowly   Complete by: As directed       Allergies as of 02/23/2021       Reactions   Percocet [oxycodone-acetaminophen] Other (See Comments)   "bugs crawling on me"        Medication List     STOP taking these medications    diphenhydrAMINE 25 MG tablet Commonly known as: BENADRYL   HYDROcodone-acetaminophen 5-325 MG tablet Commonly known as: NORCO/VICODIN   loperamide 2 MG capsule Commonly known as: IMODIUM   mirtazapine 15 MG tablet Commonly known as: REMERON   vortioxetine HBr 5 MG Tabs tablet Commonly known as: TRINTELLIX       TAKE these medications    albuterol 108 (90 Base) MCG/ACT inhaler Commonly known as: VENTOLIN HFA Inhale 1-2 puffs into the lungs every 4 (four) hours as needed for wheezing or shortness of breath.   ALPRAZolam 0.5 MG tablet Commonly known as: XANAX Take 0.5 mg by mouth 3 (three) times daily as needed for anxiety.   Cyanocobalamin 3000 MCG Caps Take 3,000 mcg by mouth every morning. Vitamin B12   diphenoxylate-atropine 2.5-0.025 MG tablet Commonly known as: Lomotil Take 1 tablet by mouth 4 (four) times daily as needed for diarrhea or loose stools.   fluticasone-salmeterol 250-50 MCG/ACT Aepb  Commonly known as: ADVAIR Inhale 1 puff into the lungs 2 (two) times daily as needed (shortness of breath/wheezing).   folic acid 1 MG tablet Commonly known as: FOLVITE Take 1 tablet (1 mg total) by mouth daily. Start taking on: February 24, 2021   guaiFENesin 600 MG 12 hr tablet Commonly known as: MUCINEX Take 1 tablet (600 mg total) by mouth 2 (two) times daily.   levETIRAcetam 1000 MG tablet Commonly known as: KEPPRA Take 1 tablet (1,000 mg total) by mouth 2 (two)  times daily. What changed: when to take this   lisinopril 5 MG tablet Commonly known as: ZESTRIL Take 10 mg by mouth every morning.   Melatonin 10 MG Tabs Take 10 mg by mouth at bedtime.   ondansetron 4 MG tablet Commonly known as: ZOFRAN Take 4 mg by mouth every 8 (eight) hours as needed for nausea or vomiting.   oxybutynin 10 MG 24 hr tablet Commonly known as: DITROPAN-XL Take 10 mg by mouth every morning.   PHENobarbital 32.4 MG tablet Commonly known as: LUMINAL Take 1 tablet (32.4 mg total) by mouth 2 (two) times daily.   thiamine 100 MG tablet Take 1 tablet (100 mg total) by mouth daily. Start taking on: February 24, 2021        Follow-up Information     Mindy Mile, NP Follow up in 1 week(s).   Contact information: [place] [address] [phone]         Sherren Mocha, MD .   Specialty: Cardiology Contact information: [address] [place] [address] [phone]                  Time coordinating discharge: 35 min  Signed:  Geradine Girt DO  [facility] 02/23/2021, 12:47 PM

## 2021-02-23 NOTE — Progress Notes (Signed)
Mobility Specialist Progress Note:   02/23/21 1313  Mobility  Activity Ambulated in hall  Level of Assistance Independent after set-up  Assistive Device Front wheel walker  Distance Ambulated (ft) 300 ft  Mobility Ambulated with assistance in hallway  Mobility Response Tolerated well  Mobility performed by Mobility specialist  $Mobility charge 1 Mobility   Pt received in bed willing to participate in mobility. No complaints of pain. Pt returned to bed with call bell in reach and all needs met.   [facility] Public librarian Phone [phone] Secondary Phone [phone]

## 2021-02-23 NOTE — Plan of Care (Signed)
  Problem: Education: Goal: Knowledge of General Education information will improve Description Including pain rating scale, medication(s)/side effects and non-pharmacologic comfort measures Outcome: Progressing   Problem: Health Behavior/Discharge Planning: Goal: Ability to manage health-related needs will improve Outcome: Progressing   

## 2021-02-23 NOTE — TOC Initial Note (Signed)
Transition of Care [facility]) - Initial/Assessment Note    Patient Details  Name: Mindy Young MRN: [id number] Date of Birth: 08/17/1945  Transition [facility]) CM/SW Contact:    Tresa Endo Phone Number: 02/23/2021, 1:08 PM  Clinical Narrative:                 CSW received SNF consult. CSW met with pt via phone. CSW introduced self and explained role at the hospital. Pt reports that PTA the pt lives at home alone with aides to assist. PT reports minA walking [id number] ad a click score of 19. Pt refused SNF and would like to return home with [facility] services.  CSW has updated NCM Neoma Laming and will sign off unless further needed.    Expected Discharge Plan: Skilled Nursing Facility Barriers to Discharge: Barriers Resolved   Patient Goals and CMS Choice Patient states their goals for this hospitalization and ongoing recovery are:: Return home with [facility] Services CMS Medicare.gov Compare Post Acute Care list provided to:: Patient Choice offered to / list presented to : Patient  Expected Discharge Plan and Services Expected Discharge Plan: [place] In-house Referral: Clinical Social Work   Post Acute Care Choice: NA Living arrangements for the past 2 months: Single Family Home Expected Discharge Date: 02/23/21                                    Prior Living Arrangements/Services Living arrangements for the past 2 months: [place] Lives with:: Self (Pt has active caregivers) Patient language and need for interpreter reviewed:: Yes Do you feel safe going back to the place where you live?: Yes        Care giver support system in place?: Yes (comment)   Criminal Activity/Legal Involvement Pertinent to Current Situation/Hospitalization: No - Comment as needed  Activities of Daily Living Home Assistive Devices/Equipment: None ADL Screening (condition at time of admission) Patient's cognitive ability adequate to safely complete daily activities?:  Yes Is the patient deaf or have difficulty hearing?: No Does the patient have difficulty seeing, even when wearing glasses/contacts?: No Does the patient have difficulty concentrating, remembering, or making decisions?: No Patient able to express need for assistance with ADLs?: Yes Does the patient have difficulty dressing or bathing?: No Independently performs ADLs?: Yes (appropriate for developmental age) Does the patient have difficulty walking or climbing stairs?: No Weakness of Legs: None Weakness of Arms/Hands: None  Permission Sought/Granted Permission sought to share information with : Family Supports Permission granted to share information with : Yes, Verbal Permission Granted  Share Information with NAME: Karel Jarvis (Other)   [phone]  Permission granted to share info w AGENCY: No  Permission granted to share info w Relationship: Karel Jarvis (Other)   [phone]  Permission granted to share info w Contact Information: Karel Jarvis (Other)   [phone]  Emotional Assessment Appearance:: Appears stated age Attitude/Demeanor/Rapport: Engaged Affect (typically observed): Appropriate Orientation: : Oriented to Self, Oriented to Place, Oriented to  Time, Oriented to Situation Alcohol / Substance Use: Not Applicable Psych Involvement: No (comment)  Admission diagnosis:  Hypokalemia [E87.6] Hypomagnesemia [E83.42] Elevated troponin [R77.8] Diarrhea [R19.7] Patient Active Problem List   Diagnosis Date Noted   Hypomagnesemia 02/20/2021   Elevated troponin 02/20/2021   COPD (chronic obstructive pulmonary disease) ([place]) 02/20/2021   Diarrhea 02/20/2021   Prolonged QT interval 02/20/2021   Lung neoplasm 02/20/2021  Alcohol use disorder, severe, in controlled environment ([place]) 12/29/2020   Alcohol use disorder, severe, dependence ([place]) 12/29/2020   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 ([place]) 12/29/2020   Alcohol dependence with  uncomplicated intoxication ([place])    Clavicle fracture 10/29/2020   Rib fracture 10/29/2020   Hypokalemia 06/14/2020   Seizure ([place]) 06/13/2020   Impingement syndrome of left shoulder 03/27/2019   Pain in left shoulder 02/21/2019   Rib pain on right side 02/21/2019   Low back pain 02/07/2019   GERD (gastroesophageal reflux disease) 12/09/2018   Anxiety 12/09/2018   Difficulty speaking 12/09/2018   Leukocytosis 12/09/2018   Seizure-like activity ([place]) 12/09/2018   Pelvic fracture ([place]) 11/25/2018   Pubic bone fracture ([place]) 11/24/2018   Left knee pain 03/29/2018   Unilateral primary osteoarthritis, left knee 03/29/2018   Genetic testing 01/24/2018   Family history of breast cancer    Family history of prostate cancer    Malignant neoplasm of lower-inner quadrant of right breast of female, estrogen receptor positive ([place]) 10/24/2017   Anemia 06/29/2011   Hypertension    Hyponatremia 06/25/2011   Transaminitis 06/25/2011   Alcohol abuse 06/25/2011   Cough 06/25/2011   Cigarette smoker 06/25/2011   PCP:  Cipriano Mile, NP Pharmacy:   [facility] [place] [address] [place] [address] Phone: [phone] Fax: [phone]     Social Determinants of Health (SDOH) Interventions    Readmission Risk Interventions Readmission Risk Prevention Plan 02/23/2021  Transportation Screening Complete  Medication Review ([place]) Complete  PCP or Specialist appointment within 3-5 days of discharge Complete  HRI or [place] Complete  SW Recovery Care/Counseling Consult Complete  [place] Patient Refused  Some recent data might be hidden

## 2021-02-23 NOTE — Care Management Important Message (Signed)
Important Message  Patient Details  Name: DOMINGUE COLTRAIN MRN: [id number] Date of Birth: [birth year]   Medicare Important Message Given:  Yes     Shelda Altes 02/23/2021, 11:02 AM

## 2021-02-23 NOTE — Plan of Care (Signed)
  Problem: Education: Goal: Knowledge of General Education information will improve Description: Including pain rating scale, medication(s)/side effects and non-pharmacologic comfort measures Outcome: Adequate for Discharge   

## 2021-02-23 NOTE — Progress Notes (Signed)
Physical Therapy Treatment Patient Details Name: Mindy Young MRN: [id number] DOB: Oct 04, 1945 Today's Date: 02/23/2021   History of Present Illness 76 yo female with onset of diarrhea was admitted 12/30 for management, noted low K+, lightheaded feelings, SOB, no falls, recent UTI and tachycardia with low magnesium.  Found a lung mass when she was cleared for PE.  Diarrhea is managed, referred to ck mobility.  PMHx: breast CA, EtOH use, COPD, HTN, smoker, diarrhea, UTI,    PT Comments    Patient progressing gradually with acute PT. Required encouragement to participate in therapy at start but agreeable. Pt ambulated functional distance of ~240' with no device needed. Pt with slight tremor today but no LOB noted. HR elevated with gait into 130's and pt required extended rest break of >60 seconds to recover to 110's at rest. Pt continues to decline SNF and mobility is improving, HHPT will be appropriate follow up as pt has home aid ~6 hours/day at home for assist/supervision. Instructed pt on exercises for LE strengthening and educated on continuing exercise with HHPT when pt is medically ready for discharge home. Acute PT will continue to progress pt as able throughout her stay.     Recommendations for follow up therapy are one component of a multi-disciplinary discharge planning process, led by the attending physician.  Recommendations may be updated based on patient status, additional functional criteria and insurance authorization.  Follow Up Recommendations  Home health PT     Assistance Recommended at Discharge Frequent or constant Supervision/Assistance  Patient can return home with the following A little help with walking and/or transfers;A little help with bathing/dressing/bathroom;Help with stairs or ramp for entrance   Equipment Recommendations  None recommended by PT    Recommendations for Other Services       Precautions / Restrictions Precautions Precautions: Fall Precaution  Comments: monitor O2 sat and HR, hypertensive Restrictions Weight Bearing Restrictions: No     Mobility  Bed Mobility Overal bed mobility: Needs Assistance Bed Mobility: Supine to Sit;Sit to Supine     Supine to sit: Supervision Sit to supine: Supervision   General bed mobility comments: supervison with HOB slightly elevated for bed mobility.    Transfers Overall transfer level: Needs assistance Equipment used: Rolling walker (2 wheels);1 person hand held assist Transfers: Sit to/from Stand Sit to Stand: Min guard;Supervision           General transfer comment: min guard/supervision for safety, pt completed from EOB and toilet with no assist for power up. able to complete with and without UE use.    Ambulation/Gait Ambulation/Gait assistance: Min guard;Supervision Gait Distance (Feet): 240 Feet Assistive device: None Gait Pattern/deviations: Step-through pattern;Decreased stride length;Shuffle;Drifts right/left Gait velocity: fair     General Gait Details: pt mildly unsteady but no overt LOB noted. pt ambulated functional distance in hall and no c/o dizziness, chest pain, SOB. pt HR elevated to max of 138 during gait and maintained in 120's-130's during gait and recovered to 110's with >60 minutes seated rest.   Stairs             Wheelchair Mobility    Modified Rankin (Stroke Patients Only)       Balance                                            Cognition Arousal/Alertness: Awake/alert Behavior During Therapy: Flat affect  Overall Cognitive Status: Within Functional Limits for tasks assessed Area of Impairment: Problem solving;Safety/judgement                         Safety/Judgement: Decreased awareness of safety   Problem Solving: Slow processing;Requires verbal cues General Comments: pt with flat affect but answers questions appropriately with slightly increased time. pt with slightly reduced safety awareness  despite awareness of deficits (self reporting) increased tremors today due to withdrawl. pt getting up to bathroom without assist despite slight imbalance issues. reporting has home assistance about 6 hrs/day from home aid.        Exercises Other Exercises Other Exercises: 10reps Sit<>Stand, no UE use for power up from EOB.    General Comments General comments (skin integrity, edema, etc.): HR in high 130's during gait and max of 144 bpm reached after 10 reps sit<>stand. recovered to 110's with >60 minutes rest.      Pertinent Vitals/Pain Pain Assessment: No/denies pain Faces Pain Scale: No hurt    Home Living                          Prior Function            PT Goals (current goals can now be found in the care plan section) Acute Rehab PT Goals Patient Stated Goal: to get home as soon as possible PT Goal Formulation: With patient Time For Goal Achievement: 03/08/21 Potential to Achieve Goals: Good Progress towards PT goals: Progressing toward goals    Frequency    Min 3X/week      PT Plan Current plan remains appropriate    Co-evaluation              AM-PAC PT "6 Clicks" Mobility   Outcome Measure  Help needed turning from your back to your side while in a flat bed without using bedrails?: None Help needed moving from lying on your back to sitting on the side of a flat bed without using bedrails?: None Help needed moving to and from a bed to a chair (including a wheelchair)?: A Little Help needed standing up from a chair using your arms (e.g., wheelchair or bedside chair)?: A Little Help needed to walk in hospital room?: A Little Help needed climbing 3-5 steps with a railing? : A Lot 6 Click Score: 19    End of Session Equipment Utilized During Treatment: Gait belt Activity Tolerance: Patient tolerated treatment well Patient left: in bed;with call bell/phone within reach;with bed alarm set Nurse Communication: Mobility status PT Visit  Diagnosis: Unsteadiness on feet (R26.81);Muscle weakness (generalized) (M62.81)     Time: 1941-7408 PT Time Calculation (min) (ACUTE ONLY): 25 min  Charges:  $Gait Training: 8-22 mins $Therapeutic Activity: 8-22 mins                     Mindy Young, Mindy Young Acute Rehabilitation Services Office [phone] Pager [phone]    Mindy Young 02/23/2021, 10:21 AM

## 2021-02-24 LAB — CULTURE, BLOOD (ROUTINE X 2)
Culture: NO GROWTH
Culture: NO GROWTH
Specimen Description: ADEQUATE

## 2021-02-25 ENCOUNTER — Other Ambulatory Visit: Payer: Self-pay | Admitting: Family Medicine

## 2021-02-25 DIAGNOSIS — C50311 Malignant neoplasm of lower-inner quadrant of right female breast: Secondary | ICD-10-CM

## 2021-02-25 DIAGNOSIS — Z803 Family history of malignant neoplasm of breast: Secondary | ICD-10-CM

## 2021-02-25 DIAGNOSIS — Z13820 Encounter for screening for osteoporosis: Secondary | ICD-10-CM

## 2021-02-26 LAB — AEROBIC/ANAEROBIC CULTURE W GRAM STAIN (SURGICAL/DEEP WOUND)
Culture: NORMAL
Gram Stain: NONE SEEN

## 2021-02-26 LAB — ACID FAST SMEAR (AFB, MYCOBACTERIA): Acid Fast Smear: NEGATIVE

## 2021-03-01 NOTE — Progress Notes (Signed)
Office Visit    Patient Name: Mindy Young Date of Encounter: 03/02/2021  Primary Care Provider:  Loura Pardon, MD Primary Cardiologist:  Sherren Mocha, MD  Chief Complaint    76 year old female with a history of tachycardia, HFmrEF, hypertension, COPD, tobacco use, anemia, hypokalemia, hypomagnesemia, hyponatremia, breast cancer, and alcohol abuse who presents for follow-up related to HFmrEF following recent hospitalization the setting of dehydration secondary to dehydration and alcohol withdrawal.   Past Medical History    Past Medical History:  Diagnosis Date   Alcohol abuse    Allergy    Anemia 06/29/2011   Anxiety    Arthritis    Breast cancer ([place])    right breast   C1 cervical fracture ([place]) 02/1997   Cancer ([place])    melanoma   Depression    Family history of breast cancer    Family history of prostate cancer    GERD (gastroesophageal reflux disease)    Hypertension    Tardive dyskinesia    Past Surgical History:  Procedure Laterality Date   ABDOMINAL HYSTERECTOMY     ASPIRATION OF ABSCESS Right 11/20/2017   Procedure: ASPIRATION OF RIGHT AXILLARY SEROMA;  Surgeon: Rolm Bookbinder, MD;  Location: [place];  Service: General;  Laterality: Right;   AUGMENTATION MAMMAPLASTY Bilateral    biateral implants , approx 2015   BREAST LUMPECTOMY Right 11/02/2017   re-ex 11-20-17   BREAST LUMPECTOMY WITH RADIOACTIVE SEED AND SENTINEL LYMPH NODE BIOPSY Right 11/02/2017   Procedure: BREAST LUMPECTOMY WITH RADIOACTIVE SEED AND SENTINEL LYMPH NODE BIOPSY;  Surgeon: Rolm Bookbinder, MD;  Location: [place];  Service: General;  Laterality: Right;   BRONCHIAL WASHINGS  02/21/2021   Procedure: BRONCHIAL WASHINGS;  Surgeon: Candee Furbish, MD;  Location: [facility] ENDOSCOPY;  Service: Pulmonary;;   CHOLECYSTECTOMY     collarbone     INCONTINENCE SURGERY     KNEE SURGERY     removal of cyst, repair of cartiledge   LAPAROSCOPY     for  endometriosis   RE-EXCISION OF BREAST LUMPECTOMY Right 11/20/2017   Procedure: RE-EXCISION OF RIGHT BREAST MARGINS;  Surgeon: Rolm Bookbinder, MD;  Location: [place];  Service: General;  Laterality: Right;   ROTATOR CUFF REPAIR     left   VIDEO BRONCHOSCOPY Right 02/21/2021   Procedure: VIDEO BRONCHOSCOPY WITHOUT FLUORO;  Surgeon: Candee Furbish, MD;  Location: [facility] ENDOSCOPY;  Service: Pulmonary;  Laterality: Right;    Allergies  Allergies  Allergen Reactions   Percocet [Oxycodone-Acetaminophen] Other (See Comments)    "bugs crawling on me"    History of Present Illness    76 year old female with the above past medical history including tachycardia, HFmrEF, hypertension, COPD, tobacco use, anemia, hypokalemia, hypomagnesemia, hyponatremia, breast cancer, and alcohol abuse.  She was initially seen by Dr. Burt Knack in January 2020 in the setting of sinus tachycardia at rest.  Echocardiogram at the time showed an EF of 55 to 60%, impaired diastolic relaxation.  Outpatient Holter monitor showed sinus rhythm average heart rate 84 bpm, short runs of PSVT, but no sustained arrhythmia, rare PVCs, no atrial fibrillation or flutter.  Unfortunately, she has not had any follow-up since this time.   She was recently hospitalized from 02/19/2021 to 02/23/2021 with hypokalemia/hypomagnesemia, and hyponatremia in the setting of alcohol withdrawal and dehydration following a 1 month history of diarrhea. Troponin was elevated at the time of hospitalization (433 ? 180).  EKG on admission showed sinus tachycardia c/  PACs, non-specific lateral ST changes, but no acute ST elevation or depression, LAFB, and prolonged QTC (503). CT angio of the chest was negative for PE,  but did show aortic atherosclerosis, moderate to advanced emphysema, and collapsed right middle lobe in the setting of acute mucous plug for which she underwent bronchoscopy.  Repeat echocardiogram showed EF 40 to 45%, mildly decreased  LV function, LV global hypokinesis, moderate LVH, and moderate TR. GI was consulted in the setting of diarrhea with recommendation for outpatient follow-up. She declined rehab for alcohol abuse as well as home health discharge. She was advised to follow-up with cardiology as an outpatient.  She presents today for follow-up accompanied by her caregiver and service dog.  Since her hospitalization she has been stable from a cardiac standpoint. Her heart rate is elevated in office today, however, this is not a new problem for her as she has a history of tachycardia. Her PCP recently [place] her lisinopril in the setting of lower blood pressure. She is working on cutting back on her drinking and smoking.  She denies any symptoms of withdrawal.  She reports mild stable dyspnea on exertion, denies any symptoms of angina. She does have daily in-home assistance with a personal caregiver.  She denies any other complaints or concerns today.  Home Medications    Current Outpatient Medications  Medication Sig Dispense Refill   albuterol (VENTOLIN HFA) 108 (90 Base) MCG/ACT inhaler Inhale 1-2 puffs into the lungs every 4 (four) hours as needed for wheezing or shortness of breath.     Cyanocobalamin 3000 MCG CAPS Take 3,000 mcg by mouth every morning. Vitamin B12     diphenoxylate-atropine (LOMOTIL) 2.5-0.025 MG tablet Take 1 tablet by mouth 4 (four) times daily as needed for diarrhea or loose stools. 30 tablet 0   fluticasone-salmeterol (ADVAIR) 250-50 MCG/ACT AEPB Inhale 1 puff into the lungs 2 (two) times daily as needed (shortness of breath/wheezing).     folic acid (FOLVITE) 1 MG tablet Take 1 tablet (1 mg total) by mouth daily. 30 tablet 0   guaiFENesin (MUCINEX) 600 MG 12 hr tablet Take 1 tablet (600 mg total) by mouth 2 (two) times daily. 60 tablet 0   levETIRAcetam (KEPPRA) 1000 MG tablet Take 1 tablet (1,000 mg total) by mouth 2 (two) times daily. (Patient taking differently: Take 1,000 mg by mouth every 12  (twelve) hours.) 60 tablet 0   Melatonin 10 MG TABS Take 10 mg by mouth at bedtime.     metoprolol succinate (TOPROL-XL) 25 MG 24 hr tablet Take 1 tablet (25 mg total) by mouth daily. Take with or immediately following a meal. 60 tablet 5   ondansetron (ZOFRAN) 4 MG tablet Take 4 mg by mouth every 8 (eight) hours as needed for nausea or vomiting.     oxybutynin (DITROPAN-XL) 10 MG 24 hr tablet Take 10 mg by mouth every morning.  0   PHENobarbital (LUMINAL) 32.4 MG tablet Take 1 tablet (32.4 mg total) by mouth 2 (two) times daily. 6 tablet 0   thiamine 100 MG tablet Take 1 tablet (100 mg total) by mouth daily. 30 tablet 0   No current facility-administered medications for this visit.     Review of Systems    She denies chest pain, palpitations, pnd, orthopnea, n, v, dizziness, syncope, edema, weight gain, or early satiety. All other systems reviewed and are otherwise negative except as noted above.   Physical Exam    VS:  BP 110/70 (BP Location: Left Arm,  Patient Position: Sitting, Cuff Size: Normal)    Pulse (!) 110    Resp 20    Ht 5\' 2"  (1.575 m)    Wt 139 lb 6.4 oz (63.2 kg)    SpO2 96%    BMI 25.50 kg/m  GEN: Well nourished, well developed, in no acute distress. HEENT: normal. Neck: Supple, no JVD, carotid bruits, or masses. Cardiac: Tachycardia, no murmurs, rubs, or gallops. No clubbing, cyanosis, edema.  Radials/DP/PT 2+ and equal bilaterally.  Respiratory:  Respirations regular and unlabored, clear to auscultation bilaterally. GI: Soft, nontender, nondistended, BS + x 4. MS: no deformity or atrophy. Skin: warm and dry, no rash. Neuro:  Strength and sensation are intact. Psych: Normal affect.  Accessory Clinical Findings    ECG personally reviewed by me today - Sinus tachycardia, 110 bpm, nonspecific ST changes- no acute changes.  Lab Results  Component Value Date   WBC 4.6 02/23/2021   HGB 10.7 (L) 02/23/2021   HCT 31.8 (L) 02/23/2021   MCV 103.6 (H) 02/23/2021   PLT  234 02/23/2021   Lab Results  Component Value Date   CREATININE 0.65 02/23/2021   BUN <5 (L) 02/23/2021   NA 134 (L) 02/23/2021   K 3.6 02/23/2021   CL 97 (L) 02/23/2021   CO2 26 02/23/2021   Lab Results  Component Value Date   ALT 29 02/21/2021   AST 43 (H) 02/21/2021   ALKPHOS 60 02/21/2021   BILITOT 0.3 02/21/2021   Lab Results  Component Value Date   CHOL 157 12/10/2018   HDL 59 12/10/2018   LDLCALC 88 12/10/2018   TRIG 51 12/10/2018   CHOLHDL 2.7 12/10/2018    Lab Results  Component Value Date   HGBA1C 6.0 (H) 12/10/2018    Assessment & Plan    1. HFmrEF/elevated troponin/new TWI on recent EKG: Longstanding history of alcohol and tobacco use.  Newly reduced EF of 40-45% per echo on 02/21/21. Troponin elevated during recent hospitalization.  EKG with new TWI  in leads V1, V2, V5, V6, 1, avL.  Recent CT chest showed normal caliber great vessels, aortic atherosclerosis. Stable with no anginal symptoms she does have stable mild dyspnea on exertion, occasionally when climbing stairs. Euvolemic and well compensated on exam.  EKG today shows sinus tachycardia, 110 bpm, nonspecific ST changes, no acute changes. GDMT is limited in the setting of lower blood pressures.  Lisinopril was recently DC'd by PCP.  We will start metoprolol succinate 25 mg daily to see if this helps her elevated heart rate. I will have her monitor her blood pressure and heart rate over the next week and report back with readings. Ideally, given newly reduced EF, mild DOE, and recent EKG changes, would pursue ischemic evaluation with coronary CTA.  However, given elevated heart rate, will defer ischemic evaluation at this time given she is generally asymptomatic and to allow for improvement of tachycardia. We will have her follow-up in 1 month and reassess heart rate.  If heart rate improved on metoprolol, will consider scheduling coronary CTA at that time.  If heart rate remains elevated above goal, could consider  Lexiscan Myoview as alternative for further ischemic evaluation.  2. Tachycardia: Prior monitor reassuring.  She has recently cut back on her alcohol and intake and this could be contributing to her elevated heart rate.  Given new diagnosis of HFmrEF, will start metoprolol succinate 25 mg daily as above.  Monitor heart rate and BP as above.  3. Hypokalemia/hypomagnesemia/hyponatremia: In the  setting of acute diarrhea, dehydration and EtOH withdrawal.  Labs were stable at discharge. Repeat BMET today.  Follow-up with PCP.  4. COPD: Reports some mild stable dyspnea on exertion.  Following with pulmonology.  Smoking cessation advised.  She has cut down from 2 packs a day to 5 cigarettes daily.  5. Diarrhea: Resolved per pt. Follow-up with GI as planned.  6. Tobacco/ETOH abuse: She states she has cut back on her alcohol intake as well as her smoking.  Full cessation of both advised.  She declines rehab and nicotine patch today.  7. Disposition: Follow-up in 1 month.  Lenna Sciara, NP 03/02/2021, 9:54 AM

## 2021-03-02 ENCOUNTER — Ambulatory Visit (HOSPITAL_BASED_OUTPATIENT_CLINIC_OR_DEPARTMENT_OTHER): Payer: Medicare Other | Admitting: Nurse Practitioner

## 2021-03-02 ENCOUNTER — Other Ambulatory Visit: Payer: Self-pay

## 2021-03-02 ENCOUNTER — Encounter (HOSPITAL_BASED_OUTPATIENT_CLINIC_OR_DEPARTMENT_OTHER): Payer: Self-pay | Admitting: Nurse Practitioner

## 2021-03-02 VITALS — BP 110/70 | HR 110 | Resp 20 | Ht 62.0 in | Wt 139.4 lb

## 2021-03-02 DIAGNOSIS — Z720 Tobacco use: Secondary | ICD-10-CM

## 2021-03-02 DIAGNOSIS — R778 Other specified abnormalities of plasma proteins: Secondary | ICD-10-CM

## 2021-03-02 DIAGNOSIS — I5022 Chronic systolic (congestive) heart failure: Secondary | ICD-10-CM

## 2021-03-02 DIAGNOSIS — E871 Hypo-osmolality and hyponatremia: Secondary | ICD-10-CM

## 2021-03-02 DIAGNOSIS — J449 Chronic obstructive pulmonary disease, unspecified: Secondary | ICD-10-CM

## 2021-03-02 DIAGNOSIS — Z789 Other specified health status: Secondary | ICD-10-CM

## 2021-03-02 DIAGNOSIS — I5021 Acute systolic (congestive) heart failure: Secondary | ICD-10-CM

## 2021-03-02 DIAGNOSIS — E876 Hypokalemia: Secondary | ICD-10-CM

## 2021-03-02 DIAGNOSIS — R000 Tachycardia, unspecified: Secondary | ICD-10-CM | POA: Diagnosis not present

## 2021-03-02 DIAGNOSIS — R197 Diarrhea, unspecified: Secondary | ICD-10-CM

## 2021-03-02 LAB — BASIC METABOLIC PANEL
BUN/Creatinine Ratio: 18 (ref 12–28)
BUN: 14 mg/dL (ref 8–27)
CO2: 18 mmol/L — ABNORMAL LOW (ref 20–29)
Calcium: 9 mg/dL (ref 8.7–10.3)
Chloride: 95 mmol/L — ABNORMAL LOW (ref 96–106)
Creatinine, Ser: 0.8 mg/dL (ref 0.57–1.00)
Glucose: 95 mg/dL (ref 70–99)
Potassium: 4.6 mmol/L (ref 3.5–5.2)
Sodium: 133 mmol/L — ABNORMAL LOW (ref 134–144)
eGFR: 77 mL/min/{1.73_m2} (ref >59)

## 2021-03-02 MED ORDER — METOPROLOL SUCCINATE ER 25 MG PO TB24: 25.0000 mg | ORAL_TABLET | Freq: Every day | ORAL | 5 refills | Status: DC

## 2021-03-02 NOTE — Patient Instructions (Signed)
Medication Instructions:  Your physician has recommended you make the following change in your medication:   Start: Metoprolol Succinate 25mg   daily   *If you need a refill on your cardiac medications before your next appointment, please call your pharmacy*   Lab Work: Your physician recommends that you return for lab work today- BMET   If you have labs (blood work) drawn today and your tests are completely normal, you will receive your results only by: [employer] (if you have MyChart) OR A paper copy in the mail If you have any lab test that is abnormal or we need to change your treatment, we will call you to review the results.   Testing/Procedures: None ordered today    Follow-Up: At [facility], you and your health needs are our priority.  As part of our continuing mission to provide you with exceptional heart care, we have created designated Provider Care Teams.  These Care Teams include your primary Cardiologist (physician) and Advanced Practice Providers (APPs -  Physician Assistants and Nurse Practitioners) who all work together to provide you with the care you need, when you need it.  We recommend signing up for the patient portal called "MyChart".  Sign up information is provided on this After Visit Summary.  MyChart is used to connect with patients for Virtual Visits (Telemedicine).  Patients are able to view lab/test results, encounter notes, upcoming appointments, etc.  Non-urgent messages can be sent to your provider as well.   To learn more about what you can do with MyChart, go to [email or web address].    Your next appointment:   1 month(s)  The format for your next appointment:   In Person  Provider:   Laurann Montana, NP    Other Instructions Please keep a log of your blood pressure and heart rate for the next week and then send them to [place] by phone at [phone] or via mychart message!   Call us if your heart rate is consistently less than 100bpm  or greater than 120bpm!

## 2021-03-04 ENCOUNTER — Encounter: Payer: Self-pay | Admitting: Physician Assistant

## 2021-03-04 ENCOUNTER — Ambulatory Visit: Payer: Medicare Other | Admitting: Physician Assistant

## 2021-03-04 ENCOUNTER — Other Ambulatory Visit: Payer: Self-pay

## 2021-03-04 VITALS — BP 131/71 | HR 101 | Resp 20 | Ht 62.0 in | Wt 130.0 lb

## 2021-03-04 DIAGNOSIS — R569 Unspecified convulsions: Secondary | ICD-10-CM

## 2021-03-04 DIAGNOSIS — F1010 Alcohol abuse, uncomplicated: Secondary | ICD-10-CM | POA: Diagnosis not present

## 2021-03-04 MED ORDER — LEVETIRACETAM 1000 MG PO TABS: 1000.0000 mg | ORAL_TABLET | Freq: Two times a day (BID) | ORAL | 11 refills | Status: AC

## 2021-03-04 NOTE — Patient Instructions (Signed)
°  Continue Keppra 1000 mg twice daily   Follow up in 2 months  Call your psychiatrist for antianxiety meds

## 2021-03-04 NOTE — Progress Notes (Signed)
Assessment/Plan:   1.  Seizures  History of seizures with an abnormal EEG, demonstrating left temporal seizure.  Ambulatory 48-hour EEG was normal.  Last sodium on 03/02/21  was 133.  She continue to drink heavy amounts. Keppra levels on 01/02/21 was 25.7 (nl) . Her most recent labs show Keppra to be 55.5, but patient wants to stay in same dose to prevent seizures.   Needs complete  alcohol cessation under medical supervision. Check Keppra levels to adjust accordingly For now continue Keppra 1000 mg twice daily, will do adjustment depending on the levels Inpatient EEG was offered, the patient wants to defer for now Follow-up in 2 months    Subjective:   Mindy Young was seen today in follow up for seizure.  My previous records as well as any outside records available were reviewed prior to todays visit.  Pt with caregiver who supplements the history.  She was last seen at our office on 01/01/2021.  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  Patient is a 76 year old female with a history of alcohol abuse, seizure, noncompliance with medication and follow-up.  Since her last visit, the patient had  a hospital presentations for alcohol intoxication on 12/2020. She is on Keppra at 1000 mg twice daily, compliant with the medication. Routine EEG on June 14, 2020 was normal, and a 48-hour EEG was essentially unremarkable.  She denies any new seizures.  She continued to drink more than a half a bottle to a bottle of hard liquor, and no less than 1/5 but recently she had decreased intake to 1-2 shots a day.  She has been feeling very anxious today, and she knows that if she develops DT, she will present to the emergency department.  She has a new psychiatrist, and is planning to set an appointment today  o discuss adjustment of her antianxiety medications, to help her with any mood changes.  Of note, during her most recent presentation to the ER for diarrhea  she needed management for hypokalemia, hyponatremia, hypomagnesemia, and her EKG showed prolonged QT interval which is going to be followed up as an outpatient by cardiology.      Prior notes re: seizure in 2021:  She was in the hospital in October with seizure.  Patient was at the emergency room the day prior to admission.  She was there for pain in the hip following a fall as well as constipation.  She was evaluated and sent home.  The home health care aide check on her around 11 am, and thought she was fine and around 1130, she had an episode where she could not bring out her words and she was staring into space.  She did not lose consciousness, but was not necessarily responding to questions.  This apparently happened several times.  She was transferred from the [facility] to [facility], and while in route she had an episode in the ambulance where she had left-sided gaze preference and was not responding with the eyes open.  She had weakness on the left side.  She was able to follow commands with the right side of the body.  She apparently also had a similar episode while in the emergency room.  Sodium was very low at 121.  She had an EEG.  She had multiple episodes during the EEG.  Episodes correlated with left temporal seizures.  She saw neurology/epilepsy at the hospital.  It was felt  that these could have been provoked in the setting of hyponatremia.  However, because of the frequency of the seizures, AEDs were initiated.  She was started on Keppra, as well as pyridoxine.  It was noted that once the hyponatremia was corrected, the patient did not have any further seizures, it was possible that the patient could be weaned off her AED.  Patient was discharged on Keppra, 1000 mg twice per day.  She states that she is not taking as directed.  She is only taking q day for the last week or so. Compliance is better when caregivers around.  Caregivers have noted that mood/agitation better but she  has recently cut out lots of medication, not just the Keppra.   She was told to follow-up here.  She actually had several visits which she canceled.  Pt states that she has had no alcohol since 09/2018.     CURRENT MEDICATIONS:  Outpatient Encounter Medications as of 03/04/2021  Medication Sig   albuterol (VENTOLIN HFA) 108 (90 Base) MCG/ACT inhaler Inhale 1-2 puffs into the lungs every 4 (four) hours as needed for wheezing or shortness of breath.   Cyanocobalamin 3000 MCG CAPS Take 3,000 mcg by mouth every morning. Vitamin B12   diphenoxylate-atropine (LOMOTIL) 2.5-0.025 MG tablet Take 1 tablet by mouth 4 (four) times daily as needed for diarrhea or loose stools.   fluticasone-salmeterol (ADVAIR) 250-50 MCG/ACT AEPB Inhale 1 puff into the lungs 2 (two) times daily as needed (shortness of breath/wheezing).   folic acid (FOLVITE) 1 MG tablet Take 1 tablet (1 mg total) by mouth daily.   guaiFENesin (MUCINEX) 600 MG 12 hr tablet Take 1 tablet (600 mg total) by mouth 2 (two) times daily.   levETIRAcetam (KEPPRA) 1000 MG tablet Take 1 tablet (1,000 mg total) by mouth 2 (two) times daily.   Melatonin 10 MG TABS Take 10 mg by mouth at bedtime.   metoprolol succinate (TOPROL-XL) 25 MG 24 hr tablet Take 1 tablet (25 mg total) by mouth daily. Take with or immediately following a meal.   ondansetron (ZOFRAN) 4 MG tablet Take 4 mg by mouth every 8 (eight) hours as needed for nausea or vomiting.   oxybutynin (DITROPAN-XL) 10 MG 24 hr tablet Take 10 mg by mouth every morning.   [DISCONTINUED] levETIRAcetam (KEPPRA) 1000 MG tablet Take 1 tablet (1,000 mg total) by mouth 2 (two) times daily. (Patient taking differently: Take 1,000 mg by mouth every 12 (twelve) hours.)   PHENobarbital (LUMINAL) 32.4 MG tablet Take 1 tablet (32.4 mg total) by mouth 2 (two) times daily. (Patient not taking: Reported on 03/04/2021)   thiamine 100 MG tablet Take 1 tablet (100 mg total) by mouth daily. (Patient not taking: Reported on  03/04/2021)   No facility-administered encounter medications on file as of 03/04/2021.     Objective:   PHYSICAL EXAMINATION:    VITALS:   Vitals:   03/04/21 1123  BP: 131/71  Pulse: (!) 101  Resp: 20  Weight: 130 lb (59 kg)  Height: 5\' 2"  (1.575 m)    GEN:  The patient appears stated age and is in NAD.  Very anxious appearing HEENT:  Normocephalic, atraumatic.  The mucous membranes are moist. The superficial temporal arteries are without ropiness or tenderness. CV: Mildly tachycardic but regular rhythm Lungs:  CTAB Neck/HEME:  There are no carotid bruits bilaterally.  Neurological examination:  Orientation: The patient is alert and oriented to person/place.   Cranial nerves: There is good facial symmetry.The speech is  fluent and clear but mild dysarthria is noted. Soft palate rises symmetrically and there is no tongue deviation. Hearing is intact to conversational tone. Sensation: Sensation is intact to light touch throughout Motor: Strength is at least antigravity x4.  Movement examination: Tone: There is normal tone in the UE/LE Abnormal movements: very mild bilateral intention tremor UE .  No myoclonus.  No asterixis.   Coordination:  There is no decremation with RAM's. Gait and Station: The patient has no difficulty arising out of a deep-seated chair without the use of the hands. The patient's stride length is good but she is wide based and a bit unbalanced.    Total time spent on today's visit was 30 minutes, including both face-to-face time and nonface-to-face time.  Time included that spent on review of records (prior notes available to me/labs/imaging if pertinent), discussing treatment and goals, answering patient's questions and coordinating care.  Cc:  Loura Pardon, MD Sharene Butters, PA-C

## 2021-03-05 ENCOUNTER — Other Ambulatory Visit: Payer: Self-pay

## 2021-03-05 DIAGNOSIS — R569 Unspecified convulsions: Secondary | ICD-10-CM

## 2021-03-08 ENCOUNTER — Telehealth (HOSPITAL_BASED_OUTPATIENT_CLINIC_OR_DEPARTMENT_OTHER): Payer: Self-pay | Admitting: Family

## 2021-03-08 NOTE — Telephone Encounter (Signed)
Spoke with patient regarding the Tuesday 04/06/21 10:550 am 1 month follow up with Laurann Montana, NP---will also mail information to patient----she voiced her understanding.

## 2021-03-09 ENCOUNTER — Telehealth (HOSPITAL_BASED_OUTPATIENT_CLINIC_OR_DEPARTMENT_OTHER): Payer: Self-pay

## 2021-03-09 ENCOUNTER — Encounter (HOSPITAL_BASED_OUTPATIENT_CLINIC_OR_DEPARTMENT_OTHER): Payer: Self-pay

## 2021-03-09 MED ORDER — METOPROLOL SUCCINATE ER 50 MG PO TB24: 50.0000 mg | ORAL_TABLET | Freq: Every day | ORAL | 5 refills | Status: DC

## 2021-03-09 NOTE — Telephone Encounter (Signed)
BP and HR remain elevated. Recommend increase Toprol to 50mg  daily. Provide update of BP/HR in 1 week.  Loel Dubonnet, NP

## 2021-03-09 NOTE — Telephone Encounter (Signed)
RN called patient with updated recommendations from Laurann Montana, NP no answer mail box is full, will respond to patient my chart message.     "BP and HR remain elevated. Recommend increase Toprol to 50mg  daily. Provide update of BP/HR in 1 week."

## 2021-03-09 NOTE — Telephone Encounter (Signed)
Hey reached out to Ms. Kirn for an update and these are the pressures she sent in to me!

## 2021-03-25 LAB — FUNGAL ORGANISM REFLEX

## 2021-03-25 LAB — FUNGUS CULTURE WITH STAIN

## 2021-03-25 LAB — FUNGUS CULTURE RESULT

## 2021-03-30 ENCOUNTER — Emergency Department (HOSPITAL_BASED_OUTPATIENT_CLINIC_OR_DEPARTMENT_OTHER)
Admission: EM | Admit: 2021-03-30 | Discharge: 2021-03-30 | Disposition: A | Discharge status: Home / Self Care | Payer: Medicare Other | Attending: Emergency Medicine | Admitting: Emergency Medicine

## 2021-03-30 ENCOUNTER — Emergency Department (HOSPITAL_BASED_OUTPATIENT_CLINIC_OR_DEPARTMENT_OTHER): Payer: Medicare Other

## 2021-03-30 ENCOUNTER — Encounter (HOSPITAL_BASED_OUTPATIENT_CLINIC_OR_DEPARTMENT_OTHER): Payer: Self-pay

## 2021-03-30 ENCOUNTER — Other Ambulatory Visit: Payer: Self-pay

## 2021-03-30 DIAGNOSIS — S8992XA Unspecified injury of left lower leg, initial encounter: Secondary | ICD-10-CM | POA: Diagnosis present

## 2021-03-30 DIAGNOSIS — S41112A Laceration without foreign body of left upper arm, initial encounter: Secondary | ICD-10-CM | POA: Insufficient documentation

## 2021-03-30 DIAGNOSIS — S81812A Laceration without foreign body, left lower leg, initial encounter: Secondary | ICD-10-CM | POA: Diagnosis not present

## 2021-03-30 DIAGNOSIS — Z79899 Other long term (current) drug therapy: Secondary | ICD-10-CM | POA: Insufficient documentation

## 2021-03-30 DIAGNOSIS — W010XXA Fall on same level from slipping, tripping and stumbling without subsequent striking against object, initial encounter: Secondary | ICD-10-CM | POA: Diagnosis not present

## 2021-03-30 DIAGNOSIS — F10220 Alcohol dependence with intoxication, uncomplicated: Secondary | ICD-10-CM | POA: Insufficient documentation

## 2021-03-30 DIAGNOSIS — Y907 Blood alcohol level of 200-239 mg/100 ml: Secondary | ICD-10-CM | POA: Insufficient documentation

## 2021-03-30 DIAGNOSIS — S0990XA Unspecified injury of head, initial encounter: Secondary | ICD-10-CM | POA: Insufficient documentation

## 2021-03-30 DIAGNOSIS — Z853 Personal history of malignant neoplasm of breast: Secondary | ICD-10-CM | POA: Insufficient documentation

## 2021-03-30 DIAGNOSIS — W19XXXA Unspecified fall, initial encounter: Secondary | ICD-10-CM

## 2021-03-30 DIAGNOSIS — F10920 Alcohol use, unspecified with intoxication, uncomplicated: Secondary | ICD-10-CM

## 2021-03-30 LAB — ETHANOL: Alcohol, Ethyl (B): 227 mg/dL — ABNORMAL HIGH (ref <10)

## 2021-03-30 NOTE — ED Notes (Addendum)
CLIENT HIGH FALL RISK, FALL RISK BRACELET IN PLACE, STRETCHER PADS APPLIED. BED IN LOWEST POSITION, EXAM ROOM DOOR LEFT OPEN FOR CONSTANT OBSERVATION. POSEY ALARM PAD APPLIED TO STRETCHER, PT INSTRUCTED SEVERAL TIMES TO REMAIN ON STRETCHER AND NOT GET UP

## 2021-03-30 NOTE — ED Provider Notes (Signed)
[facility] EMERGENCY DEPARTMENT Provider Note   CSN: [id number] Arrival date & time: 03/30/21  1122     History  Chief Complaint  Patient presents with   Mindy Young is a 76 y.o. female.  Patient is a 76 yo female with PMH of breast cancer and alcohol abuse presenting for fall that occurred at 3 AM. Pt states she fell forward after tripping on granite tiles. Admits to head trauma. Denies LOC. Denies blood thinner use. Slurred speech on exam. Admits to 3 shots of whiskey at 3 am. States she wasn't going to come to the ER but her home nursing assistant recommended it this morning.  The history is provided by the patient. No language interpreter was used.  Fall Pertinent negatives include no chest pain, no abdominal pain and no shortness of breath.      Home Medications Prior to Admission medications   Medication Sig Start Date End Date Taking? Authorizing Provider  albuterol (VENTOLIN HFA) 108 (90 Base) MCG/ACT inhaler Inhale 1-2 puffs into the lungs every 4 (four) hours as needed for wheezing or shortness of breath. 10/28/20   [provider]  Cyanocobalamin 3000 MCG CAPS Take 3,000 mcg by mouth every morning. Vitamin B12    [provider]  diphenoxylate-atropine (LOMOTIL) 2.5-0.025 MG tablet Take 1 tablet by mouth 4 (four) times daily as needed for diarrhea or loose stools. 02/23/21   Geradine Girt, DO  fluticasone-salmeterol (ADVAIR) 250-50 MCG/ACT AEPB Inhale 1 puff into the lungs 2 (two) times daily as needed (shortness of breath/wheezing).    [provider]  folic acid (FOLVITE) 1 MG tablet Take 1 tablet (1 mg total) by mouth daily. 02/24/21   Geradine Girt, DO  guaiFENesin (MUCINEX) 600 MG 12 hr tablet Take 1 tablet (600 mg total) by mouth 2 (two) times daily. 02/23/21   Geradine Girt, DO  levETIRAcetam (KEPPRA) 1000 MG tablet Take 1 tablet (1,000 mg total) by mouth 2 (two) times daily. 03/04/21   Rondel Jumbo, PA-C  Melatonin  10 MG TABS Take 10 mg by mouth at bedtime.    [provider]  metoprolol succinate (TOPROL-XL) 50 MG 24 hr tablet Take 1 tablet (50 mg total) by mouth daily. Take with or immediately following a meal. 03/09/21 06/07/21  Loel Dubonnet, NP  ondansetron (ZOFRAN) 4 MG tablet Take 4 mg by mouth every 8 (eight) hours as needed for nausea or vomiting.    [provider]  oxybutynin (DITROPAN-XL) 10 MG 24 hr tablet Take 10 mg by mouth every morning. 12/05/16   [provider]  PHENobarbital (LUMINAL) 32.4 MG tablet Take 1 tablet (32.4 mg total) by mouth 2 (two) times daily. Patient not taking: Reported on 03/04/2021 02/23/21   Geradine Girt, DO  thiamine 100 MG tablet Take 1 tablet (100 mg total) by mouth daily. Patient not taking: Reported on 03/04/2021 02/24/21   Geradine Girt, DO      Allergies    Percocet [oxycodone-acetaminophen]    Review of Systems   Review of Systems  Constitutional:  Negative for chills and fever.  HENT:  Negative for ear pain and sore throat.   Eyes:  Negative for pain and visual disturbance.  Respiratory:  Negative for cough and shortness of breath.   Cardiovascular:  Negative for chest pain and palpitations.  Gastrointestinal:  Negative for abdominal pain and vomiting.  Genitourinary:  Negative for dysuria and hematuria.  Musculoskeletal:  Negative for arthralgias and back pain.  Skin:  Positive for wound. Negative for color change and rash.  Neurological:  Negative for seizures and syncope.  All other systems reviewed and are negative.  Physical Exam Updated Vital Signs BP 105/71 (BP Location: Left Arm)    Pulse 74    Temp 97.9 F (36.6 C) (Oral)    Resp 17    Ht 5\' 2"  (1.575 m)    Wt 61.2 kg    SpO2 94%    BMI 24.69 kg/m  Physical Exam Vitals and nursing note reviewed.  Constitutional:      General: She is not in acute distress.    Appearance: She is well-developed.  HENT:     Head: Normocephalic and atraumatic.  Eyes:      Conjunctiva/sclera: Conjunctivae normal.  Cardiovascular:     Rate and Rhythm: Normal rate and regular rhythm.     Heart sounds: No murmur heard. Pulmonary:     Effort: Pulmonary effort is normal. No respiratory distress.     Breath sounds: Normal breath sounds.  Abdominal:     Palpations: Abdomen is soft.     Tenderness: There is no abdominal tenderness.  Musculoskeletal:        General: No swelling.     Cervical back: Neck supple. No bony tenderness.     Thoracic back: No bony tenderness.     Lumbar back: No bony tenderness.  Skin:    General: Skin is warm and dry.     Capillary Refill: Capillary refill takes less than 2 seconds.       Neurological:     Mental Status: She is alert and oriented to person, place, and time.     GCS: GCS eye subscore is 4. GCS verbal subscore is 5. GCS motor subscore is 6.     Cranial Nerves: Cranial nerves 2-12 are intact.     Sensory: Sensation is intact.     Motor: Motor function is intact.     Coordination: Coordination is intact.  Psychiatric:        Mood and Affect: Mood normal.    ED Results / Procedures / Treatments   Labs (all labs ordered are listed, but only abnormal results are displayed) Labs Reviewed - No data to display  EKG None  Radiology No results found.  Procedures Procedures    Medications Ordered in ED Medications - No data to display  ED Course/ Medical Decision Making/ A&P                           Medical Decision Making Amount and/or Complexity of Data Reviewed Labs: ordered. Radiology: ordered.   12:04 PM 76 yo female with PMH of breast cancer and alcohol abuse presenting for fall approx 9 hours prior to arrival. Pt is Aox3, no acute distress, afebrile, with stables vitals. Slurred speech on exam otherwise no neurovascular deficits. Ethanol level positive. Doubt intracranial pathology. CT head and neck demonstrate no acute process. Multiple new and old skin tears on exam. New wounds irrigated with  tap water and covered with sterile guaze. Pt states tdap is up to date.   Patient in no distress and overall condition improved here in the ED. Detailed discussions were had with the patient regarding current findings, and need for close f/u with PCP or on call doctor. The patient has been instructed to return immediately if the symptoms worsen in any way for re-evaluation. Patient verbalized understanding  and is in agreement with current care plan. All questions answered prior to discharge.         Final Clinical Impression(s) / ED Diagnoses Final diagnoses:  Fall, initial encounter  Skin tear of left lower leg without complication, initial encounter  Skin tear of left upper arm without complication, initial encounter  Alcoholic intoxication without complication [facility])    Rx / DC Orders ED Discharge Orders     None         Lianne Cure, DO 14/64/31 1042

## 2021-03-30 NOTE — ED Notes (Signed)
PT given food, water, wounds irrigated per Physician request.

## 2021-03-30 NOTE — ED Notes (Addendum)
Bed alarms on, pt oriented to place, states that she is in a hospital, but thought that it was [facility], re oriented pt, informed her she was at [place] and asked if it was [facility] [address].  Pt knows day of the week and self.  Pt has new appearing skin tear to L upper arm/ac, L forearm appears older, and L wrist, pt has a scabbed over skin tear on R arm and new skin tear to L leg with bruising.  Bed alarm in place. Bed in low position, sox on

## 2021-03-30 NOTE — ED Notes (Signed)
Dressings placed by MD, care giver at bedside, pt states that she is ready to go home, pt verbalized understanding d/c instructions and follow up. Pt ambulatory to wheel chair, pt from dpt.

## 2021-03-30 NOTE — ED Notes (Signed)
Pt in bed, pt back from radiology, blood drawn via 23 gauge butterfly from L ac.  Pt tolerated well, pt remains on bed alarms.

## 2021-03-30 NOTE — ED Triage Notes (Signed)
Pt to er room number 8 via ems, per ems pt fall and has a new skin tear.  Pt states that her last drink was around 2am, pt has some slurred speech, pt has skin tear to L arm times two, and L leg with dressing in place.  Skin tear on L arms appear old.

## 2021-03-30 NOTE — ED Notes (Signed)
PT placed on bed alarm, call bed at side, Seizure pads in place for comfort and safety. PT appears to understand instructions and commands at this time and was given fall protocols and explained bed alarm. PT states understanding. PT assisted to the bedside commode by this tech. PT tolerated well and UA obtained.

## 2021-04-03 NOTE — Progress Notes (Unsigned)
Cardiology Office Note:    Date:  04/03/2021   ID:  Mindy Young, DOB February 25, 1945, MRN [id number]  PCP:  Loura Pardon, MD   [facility] Cardiologist:  Sherren Mocha, MD { Click to update primary MD,subspecialty MD or APP then REFRESH:1}    Referring MD: Loura Pardon, MD   No chief complaint on file. ***  History of Present Illness:    Mindy Young is a 76 y.o. female with a hx of hx HTN, COPD, HFrEF, tobacco use, anemia, hypokalemia, hypmagnesemia, hyponatremia, breast cancer, and alcohol abuse.   She was initially seen by Dr. Burt Knack in January 2020 in the setting of sinus tachycardia at rest. Echocardiogram at the time showed EF 55-60%, impaired diastolic relaxation. Holter monitor showed SR average heart rate 84 bpm, short runs of PSVT, but no sustained arrhythmia, rare PVCs, no atrial fibrillation or flutter.  She was hospitalized from 02/19/21 to 02/23/21 with hypokalemia/hypomagnesemia, and hyponatremia in the setting of alcohol withdrawal and dehydration following a 1 month history of diarrhea. EKG on admission showed sinus tachycardia with PACs, nonspecific lateral ST changes, but no acute ST elevation or depression, LAFB, and prolonged QTC (503). CT angio chest negative for PE, but did show aortic atheroscleroisis, moderate to advanced emphysema, and collapsed right middle lobe in the setting of acute mucous plug for which she underwent bronchoscopy. Hs troponin was elevated but downward trending 433 ? 358 ?180. Echo revealed LVEF 40-45%, global hypokinesis, moderate LVH of basal-septal segment, diastolic function indeterminate, normal RV, moderate TR. She declined rehab for alcohol abuse and home health and was advised to f/u with cardiology as outpatient.  She was seen in our office by Diona Browner, NP on 03/02/21. Recent EKGs revealed new TWI in leads V1, V2, V5, V6, 1, avL. Metoprolol was started for tachycardia initially at 25 mg with request to report BP and  HR readings in one week. Patient continued to have elevated HR and BP readings at home and metoprolol was increased to 50 mg daily. We have received no further reports from the patient.   Patient presented to ED on 03/30/21 s/p fall. States she fell forward after tripping on granite tiles. Admits to head trauma, denies LOC. No blood thinner use. Slurred speech on exam. Fall occurred after 3 shots of whiskey at 3 am. Her nursing assistant recommended that she proceed to ED for evaluation the following day approximately 9 hours later. Ethanol level positive, CT head and neck reveal no acute process. Multiple new and old skin tears on exam. Discharged home same day. Vital signs during ED visit revealed soft BP and HR in the 70s bpm.   Today, she is here   Ischemic eval Tachycardia  Past Medical History:  Diagnosis Date   Alcohol abuse    Allergy    Anemia 06/29/2011   Anxiety    Arthritis    Breast cancer ([place])    right breast   C1 cervical fracture ([place]) 02/1997   Cancer ([place])    melanoma   Depression    Family history of breast cancer    Family history of prostate cancer    GERD (gastroesophageal reflux disease)    Hypertension    Tardive dyskinesia     Past Surgical History:  Procedure Laterality Date   ABDOMINAL HYSTERECTOMY     ASPIRATION OF ABSCESS Right 11/20/2017   Procedure: ASPIRATION OF RIGHT AXILLARY SEROMA;  Surgeon: Rolm Bookbinder, MD;  Location: [facility];  Service: General;  Laterality: Right;   AUGMENTATION MAMMAPLASTY Bilateral    biateral implants , approx 2015   BREAST LUMPECTOMY Right 11/02/2017   re-ex 11-20-17   BREAST LUMPECTOMY WITH RADIOACTIVE SEED AND SENTINEL LYMPH NODE BIOPSY Right 11/02/2017   Procedure: BREAST LUMPECTOMY WITH RADIOACTIVE SEED AND SENTINEL LYMPH NODE BIOPSY;  Surgeon: Rolm Bookbinder, MD;  Location: [facility];  Service: General;  Laterality: Right;   BRONCHIAL WASHINGS  02/21/2021   Procedure: BRONCHIAL  WASHINGS;  Surgeon: Candee Furbish, MD;  Location: [facility];  Service: Pulmonary;;   CHOLECYSTECTOMY     collarbone     INCONTINENCE SURGERY     KNEE SURGERY     removal of cyst, repair of cartiledge   LAPAROSCOPY     for endometriosis   RE-EXCISION OF BREAST LUMPECTOMY Right 11/20/2017   Procedure: RE-EXCISION OF RIGHT BREAST MARGINS;  Surgeon: Rolm Bookbinder, MD;  Location: [facility];  Service: General;  Laterality: Right;   ROTATOR CUFF REPAIR     left   VIDEO BRONCHOSCOPY Right 02/21/2021   Procedure: VIDEO BRONCHOSCOPY WITHOUT FLUORO;  Surgeon: Candee Furbish, MD;  Location: [facility] ENDOSCOPY;  Service: Pulmonary;  Laterality: Right;    Current Medications: No outpatient medications have been marked as taking for the 04/06/21 encounter (Appointment) with Loel Dubonnet, NP.     Allergies:   Percocet [oxycodone-acetaminophen]   Social History   Socioeconomic History   Marital status: Married    Spouse name: Not on file   Number of children: 2   Years of education: Not on file   Highest education level: Not on file  Occupational History   Not on file  Tobacco Use   Smoking status: Every Day    Packs/day: 1.00    Types: Cigarettes   Smokeless tobacco: Never   Tobacco comments:    e-cigs  Vaping Use   Vaping Use: Former  Substance and Sexual Activity   Alcohol use: Yes    Alcohol/week: 21.0 standard drinks    Types: 21 Standard drinks or equivalent per week    Comment: 2 per weekend day; denies during the week (not consistent with prior hx)   Drug use: No   Sexual activity: Not on file  Other Topics Concern   Not on file  Social History Narrative   Right Handed    Lives in a one story home   Social Determinants of Health   Financial Resource Strain: Not on file  Food Insecurity: Not on file  Transportation Needs: Not on file  Physical Activity: Not on file  Stress: Not on file  Social Connections: Not on file     Family  History: The patient's ***family history includes Breast cancer in her maternal aunt; Breast cancer (age of onset: 34) in an other family member; Breast cancer (age of onset: 73) in her sister; COPD in her mother; Colon polyps in her father; Prostate cancer (age of onset: 2) in her father. There is no history of Colon cancer.  ROS:   Please see the history of present illness.    *** All other systems reviewed and are negative.  Labs/Other Studies Reviewed:    The following studies were reviewed today:  CT Head 03/30/21  IMPRESSION: 1. No evidence of acute intracranial abnormality. 2. Left posterior scalp contusion without acute fracture. 3. Remote right basal ganglia lacunar infarct, chronic microvascular ischemic disease, and cerebral atrophy (ICD10-G31.9).  Echo 02/21/21  Left Ventricle: Left ventricular ejection fraction, by estimation,  is 40  to 45%. Left ventricular ejection fraction by PLAX is 45 %. The left  ventricle has mildly decreased function. The left ventricle demonstrates  global hypokinesis. The left ventricular internal cavity size was normal in size. There is moderate left ventricular hypertrophy of the basal-septal segment. Left ventricular diastolic parameters are indeterminate. Normal left ventricular filling  pressure.  Right Ventricle: The right ventricular size is normal. No increase in  right ventricular wall thickness. Right ventricular systolic function is  normal. There is normal pulmonary artery systolic pressure. The tricuspid  regurgitant velocity is 2.62 m/s, and   with an assumed right atrial pressure of 3 mmHg, the estimated right  ventricular systolic pressure is 16.6 mmHg.  Left Atrium: Left atrial size was normal in size.  Right Atrium: Right atrial size was normal in size.  Pericardium: There is no evidence of pericardial effusion.  Mitral Valve: The mitral valve is normal in structure. No evidence of  mitral valve regurgitation. No evidence of  mitral valve stenosis. MV peak  gradient, 7.0 mmHg. The mean mitral valve gradient is 2.0 mmHg.  Tricuspid Valve: The tricuspid valve is normal in structure. Tricuspid  valve regurgitation is moderate . No evidence of tricuspid stenosis.  Aortic Valve: The aortic valve is tricuspid. Aortic valve regurgitation is  not visualized. No aortic stenosis is present. Aortic valve mean gradient  measures 2.0 mmHg. Aortic valve peak gradient measures 3.7 mmHg. Aortic  valve area, by VTI measures 2.99 cm.  Pulmonic Valve: The pulmonic valve was normal in structure. Pulmonic valve  regurgitation is not visualized. No evidence of pulmonic stenosis.  Aorta: The aortic root is normal in size and structure.  Venous: The inferior vena cava is normal in size with greater than 50%  respiratory variability, suggesting right atrial pressure of 3 mmHg.  IAS/Shunts: No atrial level shunt detected by color flow Doppler.   Cardiac Monitor 3/20  Sinus rhythm with an average heart rate of 84 bpm Short runs of pSVT but no sustained arrhythmia Rare PVC's No atrial fibrillation or flutter No bradycardic events  Recent Labs: 06/14/2020: TSH 1.711 02/20/2021: Magnesium 2.1 02/21/2021: ALT 29 02/23/2021: Hemoglobin 10.7; Platelets 234 03/02/2021: BUN 14; Creatinine, Ser 0.80; Potassium 4.6; Sodium 133  Recent Lipid Panel    Component Value Date/Time   CHOL 157 12/10/2018 0211   TRIG 51 12/10/2018 0211   HDL 59 12/10/2018 0211   CHOLHDL 2.7 12/10/2018 0211   VLDL 10 12/10/2018 0211   LDLCALC 88 12/10/2018 0211     Risk Assessment/Calculations:   {Does this patient have ATRIAL FIBRILLATION?[phone]}       Physical Exam:    VS:  There were no vitals taken for this visit.    Wt Readings from Last 3 Encounters:  03/30/21 135 lb (61.2 kg)  03/04/21 130 lb (59 kg)  03/02/21 139 lb 6.4 oz (63.2 kg)     GEN: *** Well nourished, well developed in no acute distress HEENT: Normal NECK: No JVD; No carotid  bruits CARDIAC: ***RRR, no murmurs, rubs, gallops RESPIRATORY:  Clear to auscultation without rales, wheezing or rhonchi  ABDOMEN: Soft, non-tender, non-distended MUSCULOSKELETAL:  No edema; No deformity. *** pedal pulses, ***bilaterally SKIN: Warm and dry NEUROLOGIC:  Alert and oriented x 3 PSYCHIATRIC:  Normal affect   EKG:  EKG is *** ordered today.  The ekg ordered today demonstrates ***  Diagnoses:    No diagnosis found. Assessment and Plan:     ***      {  Are you ordering a CV Procedure (e.g. stress test, cath, DCCV, TEE, etc)?   Press F2        :[id number]}    Medication Adjustments/Labs and Tests Ordered: Current medicines are reviewed at length with the patient today.  Concerns regarding medicines are outlined above.  No orders of the defined types were placed in this encounter.  No orders of the defined types were placed in this encounter.   There are no Patient Instructions on file for this visit.   Signed, Emmaline Life, NP  04/03/2021 11:43 AM    [place]

## 2021-04-06 ENCOUNTER — Ambulatory Visit (HOSPITAL_BASED_OUTPATIENT_CLINIC_OR_DEPARTMENT_OTHER): Payer: Medicare Other | Admitting: Nurse Practitioner

## 2021-04-07 ENCOUNTER — Emergency Department (HOSPITAL_COMMUNITY): Payer: Medicare Other

## 2021-04-07 ENCOUNTER — Encounter (HOSPITAL_COMMUNITY): Payer: Self-pay | Admitting: Emergency Medicine

## 2021-04-07 ENCOUNTER — Other Ambulatory Visit: Payer: Self-pay

## 2021-04-07 ENCOUNTER — Emergency Department (HOSPITAL_COMMUNITY)
Admission: EM | Admit: 2021-04-07 | Discharge: 2021-04-08 | Disposition: A | Discharge status: Home / Self Care | Payer: Medicare Other | Attending: Emergency Medicine | Admitting: Emergency Medicine

## 2021-04-07 DIAGNOSIS — Y908 Blood alcohol level of 240 mg/100 ml or more: Secondary | ICD-10-CM | POA: Diagnosis not present

## 2021-04-07 DIAGNOSIS — M47819 Spondylosis without myelopathy or radiculopathy, site unspecified: Secondary | ICD-10-CM | POA: Diagnosis not present

## 2021-04-07 DIAGNOSIS — S0181XA Laceration without foreign body of other part of head, initial encounter: Secondary | ICD-10-CM | POA: Diagnosis not present

## 2021-04-07 DIAGNOSIS — T148XXA Other injury of unspecified body region, initial encounter: Secondary | ICD-10-CM | POA: Diagnosis not present

## 2021-04-07 DIAGNOSIS — F10929 Alcohol use, unspecified with intoxication, unspecified: Secondary | ICD-10-CM | POA: Insufficient documentation

## 2021-04-07 DIAGNOSIS — R4182 Altered mental status, unspecified: Secondary | ICD-10-CM | POA: Diagnosis present

## 2021-04-07 DIAGNOSIS — Z23 Encounter for immunization: Secondary | ICD-10-CM | POA: Diagnosis not present

## 2021-04-07 DIAGNOSIS — S0990XA Unspecified injury of head, initial encounter: Secondary | ICD-10-CM | POA: Diagnosis present

## 2021-04-07 DIAGNOSIS — R739 Hyperglycemia, unspecified: Secondary | ICD-10-CM | POA: Insufficient documentation

## 2021-04-07 DIAGNOSIS — W01198A Fall on same level from slipping, tripping and stumbling with subsequent striking against other object, initial encounter: Secondary | ICD-10-CM | POA: Diagnosis not present

## 2021-04-07 DIAGNOSIS — W19XXXA Unspecified fall, initial encounter: Secondary | ICD-10-CM

## 2021-04-07 DIAGNOSIS — F10920 Alcohol use, unspecified with intoxication, uncomplicated: Secondary | ICD-10-CM

## 2021-04-07 LAB — I-STAT CHEM 8, ED
BUN: 8 mg/dL (ref 8–23)
Calcium, Ion: 0.95 mmol/L — ABNORMAL LOW (ref 1.15–1.40)
Chloride: 90 mmol/L — ABNORMAL LOW (ref 98–111)
Creatinine, Ser: 0.9 mg/dL (ref 0.44–1.00)
Glucose, Bld: 63 mg/dL — ABNORMAL LOW (ref 70–99)
HCT: 31 % — ABNORMAL LOW (ref 36.0–46.0)
Hemoglobin: 10.5 g/dL — ABNORMAL LOW (ref 12.0–15.0)
Potassium: 3.3 mmol/L — ABNORMAL LOW (ref 3.5–5.1)
Sodium: 128 mmol/L — ABNORMAL LOW (ref 135–145)
TCO2: 22 mmol/L (ref 22–32)

## 2021-04-07 LAB — COMPREHENSIVE METABOLIC PANEL
ALT: 40 U/L (ref 0–44)
AST: 68 U/L — ABNORMAL HIGH (ref 15–41)
Albumin: 3.2 g/dL — ABNORMAL LOW (ref 3.5–5.0)
Alkaline Phosphatase: 70 U/L (ref 38–126)
Anion gap: 22 — ABNORMAL HIGH (ref 5–15)
BUN: 10 mg/dL (ref 8–23)
CO2: 18 mmol/L — ABNORMAL LOW (ref 22–32)
Calcium: 8.1 mg/dL — ABNORMAL LOW (ref 8.9–10.3)
Chloride: 90 mmol/L — ABNORMAL LOW (ref 98–111)
Creatinine, Ser: 0.73 mg/dL (ref 0.44–1.00)
GFR, Estimated: >60 mL/min (ref >60)
Glucose, Bld: 67 mg/dL — ABNORMAL LOW (ref 70–99)
Potassium: 3.5 mmol/L (ref 3.5–5.1)
Sodium: 130 mmol/L — ABNORMAL LOW (ref 135–145)
Total Bilirubin: 0.8 mg/dL (ref 0.3–1.2)
Total Protein: 5.5 g/dL — ABNORMAL LOW (ref 6.5–8.1)

## 2021-04-07 LAB — CBC WITH DIFFERENTIAL/PLATELET
Abs Immature Granulocytes: 0.04 10*3/uL (ref 0.00–0.07)
Basophils Absolute: 0 10*3/uL (ref 0.0–0.1)
Basophils Relative: 1 %
Eosinophils Absolute: 0 10*3/uL (ref 0.0–0.5)
Eosinophils Relative: 0 %
HCT: 30.3 % — ABNORMAL LOW (ref 36.0–46.0)
Hemoglobin: 10.5 g/dL — ABNORMAL LOW (ref 12.0–15.0)
Immature Granulocytes: 1 %
Lymphocytes Relative: 12 %
Lymphs Abs: 1 10*3/uL (ref 0.7–4.0)
MCH: 36.2 pg — ABNORMAL HIGH (ref 26.0–34.0)
MCHC: 34.7 g/dL (ref 30.0–36.0)
MCV: 104.5 fL — ABNORMAL HIGH (ref 80.0–100.0)
Monocytes Absolute: 1 10*3/uL (ref 0.1–1.0)
Monocytes Relative: 13 %
Neutro Abs: 5.7 10*3/uL (ref 1.7–7.7)
Neutrophils Relative %: 73 %
Platelets: 274 10*3/uL (ref 150–400)
RBC: 2.9 MIL/uL — ABNORMAL LOW (ref 3.87–5.11)
RDW: 14.4 % (ref 11.5–15.5)
WBC: 7.7 10*3/uL (ref 4.0–10.5)
nRBC: 0 % (ref 0.0–0.2)

## 2021-04-07 LAB — ETHANOL: Alcohol, Ethyl (B): 264 mg/dL — ABNORMAL HIGH (ref <10)

## 2021-04-07 LAB — AMMONIA: Ammonia: 22 umol/L (ref 9–35)

## 2021-04-07 LAB — CBG MONITORING, ED: Glucose-Capillary: 90 mg/dL (ref 70–99)

## 2021-04-07 MED ORDER — LORAZEPAM 2 MG/ML IJ SOLN
1.0000 mg | Freq: Once | INTRAMUSCULAR | Status: AC
  Administered 2021-04-07: 1 mg via INTRAVENOUS
  Filled 2021-04-07: qty 1

## 2021-04-07 MED ORDER — LIDOCAINE-EPINEPHRINE (PF) 2 %-1:200000 IJ SOLN
10.0000 mL | Freq: Once | INTRAMUSCULAR | Status: AC
  Administered 2021-04-07: 10 mL via INTRADERMAL
  Filled 2021-04-07: qty 20

## 2021-04-07 MED ORDER — IOHEXOL 300 MG/ML  SOLN
100.0000 mL | Freq: Once | INTRAMUSCULAR | Status: AC | PRN
  Administered 2021-04-07: 100 mL via INTRAVENOUS

## 2021-04-07 MED ORDER — ONDANSETRON HCL 4 MG/2ML IJ SOLN
4.0000 mg | Freq: Once | INTRAMUSCULAR | Status: AC
  Administered 2021-04-07: 4 mg via INTRAVENOUS
  Filled 2021-04-07: qty 2

## 2021-04-07 MED ORDER — TETANUS-DIPHTH-ACELL PERTUSSIS 5-2.5-18.5 LF-MCG/0.5 IM SUSY
0.5000 mL | PREFILLED_SYRINGE | Freq: Once | INTRAMUSCULAR | Status: AC
  Administered 2021-04-07: 0.5 mL via INTRAMUSCULAR
  Filled 2021-04-07: qty 0.5

## 2021-04-07 NOTE — Progress Notes (Signed)
°   04/07/21 2326  OTHER  Substance Abuse Education Offered Yes  Substance abuse interventions Patient Counseling;Educational Materials  (CAGE-AID) Substance Abuse Screening Tool  Have You Ever Felt You Ought to Cut Down on Your Drinking or Drug Use? 1  Have People Annoyed You By Critizing Your Drinking Or Drug Use? 1  Have You Felt Bad Or Guilty About Your Drinking Or Drug Use? 1  Have You Ever Had a Drink or Used Drugs First Thing In The Morning to Steady Your Nerves or to Get Rid of a Hangover? 0  CAGE-AID Score 3

## 2021-04-07 NOTE — ED Notes (Signed)
Patient transported to CT 

## 2021-04-07 NOTE — ED Triage Notes (Signed)
Patient presents to ED via [facility] to be evaluated for fall and appears intoxicated. Care giver reports patient has been drinking unknown amount of alcohol. Per EMS patient drinks alcohol on daily bases. Patient has lacerations and bruise throughout the body.

## 2021-04-07 NOTE — ED Provider Notes (Signed)
[facility] EMERGENCY DEPARTMENT Provider Note   CSN: [id number] Arrival date & time: 04/07/21  1801     History  Chief Complaint  Patient presents with   Fall   Alcohol Intoxication    Mindy Young is a 75 y.o. female.  76 yo F with a chief complaints of a fall.  The patient fell and struck her head.  Per EMS appears to be intoxicated.  The patient does endorse drinking this evening.   Fall  Alcohol Intoxication      Home Medications Prior to Admission medications   Medication Sig Start Date End Date Taking? Authorizing Provider  albuterol (VENTOLIN HFA) 108 (90 Base) MCG/ACT inhaler Inhale 1-2 puffs into the lungs every 4 (four) hours as needed for wheezing or shortness of breath. 10/28/20   [provider]  Cyanocobalamin 3000 MCG CAPS Take 3,000 mcg by mouth every morning. Vitamin B12    [provider]  diphenoxylate-atropine (LOMOTIL) 2.5-0.025 MG tablet Take 1 tablet by mouth 4 (four) times daily as needed for diarrhea or loose stools. 02/23/21   Geradine Girt, DO  fluticasone-salmeterol (ADVAIR) 250-50 MCG/ACT AEPB Inhale 1 puff into the lungs 2 (two) times daily as needed (shortness of breath/wheezing).    [provider]  folic acid (FOLVITE) 1 MG tablet Take 1 tablet (1 mg total) by mouth daily. 02/24/21   Geradine Girt, DO  guaiFENesin (MUCINEX) 600 MG 12 hr tablet Take 1 tablet (600 mg total) by mouth 2 (two) times daily. 02/23/21   Geradine Girt, DO  levETIRAcetam (KEPPRA) 1000 MG tablet Take 1 tablet (1,000 mg total) by mouth 2 (two) times daily. 03/04/21   Rondel Jumbo, PA-C  Melatonin 10 MG TABS Take 10 mg by mouth at bedtime.    [provider]  metoprolol succinate (TOPROL-XL) 50 MG 24 hr tablet Take 1 tablet (50 mg total) by mouth daily. Take with or immediately following a meal. 03/09/21 06/07/21  Loel Dubonnet, NP  ondansetron (ZOFRAN) 4 MG tablet Take 4 mg by mouth every 8 (eight) hours as needed  for nausea or vomiting.    [provider]  oxybutynin (DITROPAN-XL) 10 MG 24 hr tablet Take 10 mg by mouth every morning. 12/05/16   [provider]  PHENobarbital (LUMINAL) 32.4 MG tablet Take 1 tablet (32.4 mg total) by mouth 2 (two) times daily. Patient not taking: Reported on 03/04/2021 02/23/21   Geradine Girt, DO  thiamine 100 MG tablet Take 1 tablet (100 mg total) by mouth daily. Patient not taking: Reported on 03/04/2021 02/24/21   Geradine Girt, DO      Allergies    Percocet [oxycodone-acetaminophen]    Review of Systems   Review of Systems  Physical Exam Updated Vital Signs BP (!) 147/81    Pulse (!) 102    Temp 97.6 F (36.4 C) (Oral)    Resp 19    SpO2 93%  Physical Exam Vitals and nursing note reviewed.  Constitutional:      General: She is not in acute distress.    Appearance: She is well-developed. She is not diaphoretic.  HENT:     Head: Normocephalic.     Comments: Left frontal C-shaped laceration about 3.5 cm in length.  Left frontal hematoma. Eyes:     Pupils: Pupils are equal, round, and reactive to light.  Cardiovascular:     Rate and Rhythm: Normal rate and regular rhythm.     Heart  sounds: No murmur heard.   No friction rub. No gallop.  Pulmonary:     Effort: Pulmonary effort is normal.     Breath sounds: No wheezing or rales.  Abdominal:     General: There is no distension.     Palpations: Abdomen is soft.     Tenderness: There is no abdominal tenderness.  Musculoskeletal:        General: No tenderness.     Cervical back: Normal range of motion and neck supple.     Comments: Scattered bruises about the arms and legs all of different ages  Skin:    General: Skin is warm and dry.  Neurological:     Mental Status: She is alert and oriented to person, place, and time.  Psychiatric:        Behavior: Behavior normal.    ED Results / Procedures / Treatments   Labs (all labs ordered are listed, but only abnormal results are  displayed) Labs Reviewed  CBC WITH DIFFERENTIAL/PLATELET - Abnormal; Notable for the following components:      Result Value   RBC 2.90 (*)    Hemoglobin 10.5 (*)    HCT 30.3 (*)    MCV 104.5 (*)    MCH 36.2 (*)    All other components within normal limits  COMPREHENSIVE METABOLIC PANEL - Abnormal; Notable for the following components:   Sodium 130 (*)    Chloride 90 (*)    CO2 18 (*)    Glucose, Bld 67 (*)    Calcium 8.1 (*)    Total Protein 5.5 (*)    Albumin 3.2 (*)    AST 68 (*)    Anion gap 22 (*)    All other components within normal limits  ETHANOL - Abnormal; Notable for the following components:   Alcohol, Ethyl (B) 264 (*)    All other components within normal limits  I-STAT CHEM 8, ED - Abnormal; Notable for the following components:   Sodium 128 (*)    Potassium 3.3 (*)    Chloride 90 (*)    Glucose, Bld 63 (*)    Calcium, Ion 0.95 (*)    Hemoglobin 10.5 (*)    HCT 31.0 (*)    All other components within normal limits  AMMONIA  CBG MONITORING, ED    EKG None  Radiology CT Head Wo Contrast  Result Date: 04/07/2021 CLINICAL DATA:  Intoxicated, lacerations and bruising EXAM: CT HEAD WITHOUT CONTRAST TECHNIQUE: Contiguous axial images were obtained from the base of the skull through the vertex without intravenous contrast. RADIATION DOSE REDUCTION: This exam was performed according to the departmental dose-optimization program which includes automated exposure control, adjustment of the mA and/or kV according to patient size and/or use of iterative reconstruction technique. COMPARISON:  03/30/2021 FINDINGS: Brain: Hypodensities throughout the periventricular white matter, basal ganglia, and thalami consistent with chronic small vessel ischemic changes. No acute infarct or hemorrhage. Lateral ventricles and remaining midline structures are unremarkable. No acute extra-axial fluid collections. No mass effect. Vascular: No hyperdense vessel or unexpected calcification.  Skull: Scalp edema involving the left frontal and left occipital regions. Laceration along the left frontal convexity. Negative for fracture or focal lesion. Sinuses/Orbits: No acute finding. Other: None. IMPRESSION: 1. Scalp edema in the left frontal and left occipital regions. 2. No acute intracranial process. 3. Stable chronic small vessel ischemic changes. Electronically Signed   By: Randa Ngo M.D.   On: 04/07/2021 22:52   CT Cervical Spine Wo Contrast  Result Date: 04/07/2021 CLINICAL DATA:  Intoxicated, multiple lacerations and bruising EXAM: CT CERVICAL SPINE WITHOUT CONTRAST TECHNIQUE: Multidetector CT imaging of the cervical spine was performed without intravenous contrast. Multiplanar CT image reconstructions were also generated. RADIATION DOSE REDUCTION: This exam was performed according to the departmental dose-optimization program which includes automated exposure control, adjustment of the mA and/or kV according to patient size and/or use of iterative reconstruction technique. COMPARISON:  03/30/2021 FINDINGS: Alignment: Stable mild anterolisthesis of C4 on C5 and C7 on T1. Skull base and vertebrae: No acute fracture. No primary bone lesion or focal pathologic process. Soft tissues and spinal canal: No prevertebral fluid or swelling. No visible canal hematoma. Disc levels: Stable bony fusion across the C4-5 disc space. Marked spondylosis at C5-6 and C6-7. There is diffuse facet hypertrophy unchanged. Marked hypertrophic changes again noted at the C1-C2 interface. Upper chest: Airway is patent. Stable emphysematous changes and scarring at the lung apices. Chronic nonunion left clavicular fracture. Other: Reconstructed images demonstrate no additional findings. IMPRESSION: 1. Stable multilevel spondylosis and facet hypertrophy. No acute cervical spine fracture. Electronically Signed   By: Randa Ngo M.D.   On: 04/07/2021 22:49   CT CHEST ABDOMEN PELVIS W CONTRAST  Result Date:  04/07/2021 CLINICAL DATA:  Fall, ETOH EXAM: CT CHEST, ABDOMEN, AND PELVIS WITH CONTRAST TECHNIQUE: Multidetector CT imaging of the chest, abdomen and pelvis was performed following the standard protocol during bolus administration of intravenous contrast. RADIATION DOSE REDUCTION: This exam was performed according to the departmental dose-optimization program which includes automated exposure control, adjustment of the mA and/or kV according to patient size and/or use of iterative reconstruction technique. CONTRAST:  125mL OMNIPAQUE IOHEXOL 300 MG/ML  SOLN COMPARISON:  CTA chest dated 02/19/2021. CT chest abdomen pelvis dated 11/06/2020. FINDINGS: CT CHEST FINDINGS Cardiovascular: No evidence of traumatic aortic injury. Atherosclerotic calcifications of the arch. The heart is normal in size.  No pericardial effusion. Mediastinum/Nodes: No evidence of intra mediastinal hematoma. No suspicious mediastinal lymphadenopathy. Visualized thyroid is unremarkable. Lungs/Pleura: Moderate centrilobular and paraseptal emphysematous changes, upper lung predominant. Biapical pleural-parenchymal scarring. No suspicious pulmonary nodules. Right middle lobe atelectasis/collapse, chronic. No focal consolidation. No pleural effusion or pneumothorax. Musculoskeletal: Bilateral breast augmentation. Old left clavicle fracture deformity. Mild superior endplate compression fracture deformity at T5, chronic. No acute fracture is seen. Mild to moderate degenerative changes of the mid/lower thoracic spine. CT ABDOMEN PELVIS FINDINGS Hepatobiliary: Dominant 16 mm lesion in the left hepatic dome (series 3/image 83), unchanged. Additional scattered subcentimeter hepatic lesions. Focal fat/altered perfusion along the falciform ligament. Status post cholecystectomy. No intrahepatic ductal dilatation, improved. No extrahepatic ductal dilatation, improved. Common duct measures 8 mm. Pancreas: Within normal limits. Spleen: Within normal limits.  Adrenals/Urinary Tract: Adrenal glands are within normal limits. Kidneys are within normal limits, noting excretory contrast in the bilateral renal collecting systems. No hydronephrosis. Bladder is within normal limits. Stomach/Bowel: Stomach is within normal limits. No evidence of bowel obstruction. Appendix is not discretely visualized. No colonic wall thickening or inflammatory changes. Vascular/Lymphatic: No evidence of abdominal aortic aneurysm. Atherosclerotic calcifications of the abdominal aorta and branch vessels. No suspicious abdominopelvic lymphadenopathy. Reproductive: Status post hysterectomy. No adnexal masses. Other: No abdominopelvic ascites. No hemoperitoneum or free air. Musculoskeletal: Old right superior and inferior pubic rami fractures. Mild degenerative changes of the lower lumbar spine. No acute fracture is seen. IMPRESSION: No evidence of acute traumatic injury to the chest, abdomen, or pelvis. Additional stable ancillary findings as above. Electronically Signed   By: Bertis Ruddy  [place] Pink M.D.   On: 04/07/2021 22:52   DG Chest Port 1 View  Result Date: 04/07/2021 CLINICAL DATA:  Trauma. EXAM: PORTABLE CHEST 1 VIEW COMPARISON:  Chest radiograph dated 02/19/2021 and CT dated 02/19/2021. FINDINGS: No focal consolidation, pleural effusion, pneumothorax. The cardiac silhouette is within limits. Atherosclerotic calcification of the aortic arch. No acute osseous pathology. Degenerative changes of the spine. Bilateral breast implants. IMPRESSION: No active cardiopulmonary disease. Electronically Signed   By: Anner Crete M.D.   On: 04/07/2021 19:31    Procedures .Marland KitchenLaceration Repair  Date/Time: 04/07/2021 9:16 PM Performed by: Deno Etienne, DO Authorized by: Deno Etienne, DO   Consent:    Consent obtained:  Verbal   Consent given by:  Patient   Risks, benefits, and alternatives were discussed: yes     Risks discussed:  Infection, pain, poor cosmetic result and poor wound healing    Alternatives discussed:  No treatment, delayed treatment and observation Universal protocol:    Procedure explained and questions answered to patient or proxy's satisfaction: yes     Immediately prior to procedure, a time out was called: yes     Patient identity confirmed:  Verbally with patient Anesthesia:    Anesthesia method:  Local infiltration   Local anesthetic:  Lidocaine 2% WITH epi Laceration details:    Location:  Face   Face location:  Forehead   Length (cm):  3.5 Exploration:    Limited defect created (wound extended): no     Hemostasis achieved with:  Epinephrine and direct pressure   Imaging outcome: foreign body not noted     Wound exploration: entire depth of wound visualized     Contaminated: no   Treatment:    Area cleansed with:  Chlorhexidine   Amount of cleaning:  Standard   Irrigation solution:  Sterile saline   Irrigation volume:  50   Irrigation method:  Pressure wash   Visualized foreign bodies/material removed: no     Debridement:  None   Undermining:  None   Scar revision: no   Skin repair:    Repair method:  Sutures   Suture size:  5-0   Suture material:  Fast-absorbing gut   Suture technique:  Simple interrupted   Number of sutures:  6 Approximation:    Approximation:  Close Repair type:    Repair type:  Simple Post-procedure details:    Dressing:  Open (no dressing)   Procedure completion:  Tolerated well, no immediate complications    Medications Ordered in ED Medications  LORazepam (ATIVAN) injection 1 mg (1 mg Intravenous Given 04/07/21 2049)  lidocaine-EPINEPHrine (XYLOCAINE W/EPI) 2 %-1:200000 (PF) injection 10 mL (10 mLs Intradermal Given 04/07/21 2055)  Tdap (BOOSTRIX) injection 0.5 mL (0.5 mLs Intramuscular Given 04/07/21 2049)  ondansetron (ZOFRAN) injection 4 mg (4 mg Intravenous Given 04/07/21 2206)  iohexol (OMNIPAQUE) 300 MG/ML solution 100 mL (100 mLs Intravenous Contrast Given 04/07/21 2244)    ED Course/ Medical Decision  Making/ A&P                           Medical Decision Making Amount and/or Complexity of Data Reviewed Labs: ordered. Radiology: ordered.  Risk Prescription drug management.   Patient is a 76 y.o. female with a cc of a fall.  Per EMS the patient is a regular drinker and has frequent falls.  She is clinically intoxicated and has trouble providing much history.  Obvious signs of trauma to the head.  She denies pain anywhere.  She is too intoxicated to give much information I will obtain CT scan from the head through the pelvis.  Patient's blood work is resulted without significant electrolyte abnormality.  She is mildly hyperglycemic.  We will give an oral trial.  Chest x-ray independently interpreted by me without fracture or pneumothorax.  CT scan of the head C-spine and chest abdomen pelvis without acute injury.  Caretaker able to take the patient home.  PCP follow-up.  10:58 PM:  I have discussed the diagnosis/risks/treatment options with the patient.  Evaluation and diagnostic testing in the emergency department does not suggest an emergent condition requiring admission or immediate intervention beyond what has been performed at this time.  They will follow up with  PCP. We also discussed returning to the ED immediately if new or worsening sx occur. We discussed the sx which are most concerning (e.g., sudden worsening pain, fever, inability to tolerate by mouth) that necessitate immediate return. Medications administered to the patient during their visit and any new prescriptions provided to the patient are listed below.  Medications given during this visit Medications  LORazepam (ATIVAN) injection 1 mg (1 mg Intravenous Given 04/07/21 2049)  lidocaine-EPINEPHrine (XYLOCAINE W/EPI) 2 %-1:200000 (PF) injection 10 mL (10 mLs Intradermal Given 04/07/21 2055)  Tdap (BOOSTRIX) injection 0.5 mL (0.5 mLs Intramuscular Given 04/07/21 2049)  ondansetron (ZOFRAN) injection 4 mg (4 mg Intravenous  Given 04/07/21 2206)  iohexol (OMNIPAQUE) 300 MG/ML solution 100 mL (100 mLs Intravenous Contrast Given 04/07/21 2244)     The patient appears reasonably screen and/or stabilized for discharge and I doubt any other medical condition or other [facility] requiring further screening, evaluation, or treatment in the ED at this time prior to discharge.          Final Clinical Impression(s) / ED Diagnoses Final diagnoses:  Alcoholic intoxication without complication ([employer])  Fall, initial encounter  Facial laceration, initial encounter    Rx / DC Orders ED Discharge Orders     None         Deno Etienne, DO 04/07/21 2258

## 2021-04-07 NOTE — Discharge Instructions (Addendum)
Return for redness drainage or if you get a fever.  The sutures that were used are dissolvable that should dissolve between day 3 and day 5.  If they are still there then you can gently plucked them out with tweezers.  The area can get wet but not fully immersed underwater.  No scrubbing.  If you really want to clean it you can apply a half-and-half hydrogen peroxide solution with water on a Q-tip.  You can apply an ointment a couple times a day this could be as simple as Vaseline but could also be an antibiotic ointment if you wish..  Once it is healed please try to avoid prolonged sun exposure use sunscreen.     [facility] [place] [email or web address], [place], [place] [address]  [phone]  [place], [place] [address] [place], [place] [address]   [phone] [email or web address] [address], [place], [place] [address]  [phone]  [email or web address] [place], [place], [place] [address]  [phone]  [facility] [address], [place], [place] [address] [phone]

## 2021-04-07 NOTE — Progress Notes (Signed)
°   04/07/21 2326  TOC ED Mini Assessment  TOC Time spent with patient (minutes): 30  PING Used in TOC Assessment No  Admission or Readmission Diverted Yes  Interventions which prevented an admission or readmission Other (must enter comment) (SUD counseling/resources)  What brought you to the Emergency Department?  fall/ETOH  Barriers to Discharge No Barriers Identified  Barrier interventions CSW met with Pt at bedside. Pt states that she drinks 2-3 shots /day.  Pt has 24 hour/day caregiver. CSW administered CAGE-AID. Resources and counseling given. Resources added to AVS.

## 2021-04-08 NOTE — ED Notes (Signed)
Care taker called to notify of patient discharge per patient request for ride home.

## 2021-04-08 NOTE — ED Notes (Signed)
Assisted to [facility], able to move form BSC to bed without assist. Linens changed and TLC given.

## 2021-04-08 NOTE — ED Notes (Signed)
On this RN's entry into room, patient in bed but seems to have recently been out of bed. Pt has urinated on floor and has crawled back in the bed. Urine cleaned up and pt reoriented to situation and advised to not get out of bed without assistance again.

## 2021-04-09 LAB — ACID FAST CULTURE WITH REFLEXED SENSITIVITIES (MYCOBACTERIA): Acid Fast Culture: NEGATIVE

## 2021-04-13 ENCOUNTER — Other Ambulatory Visit: Payer: Self-pay | Admitting: Family Medicine

## 2021-04-13 ENCOUNTER — Ambulatory Visit
Admission: RE | Admit: 2021-04-13 | Discharge: 2021-04-13 | Disposition: A | Discharge status: Home / Self Care | Payer: Medicare Other | Source: Ambulatory Visit | Attending: Family Medicine | Admitting: Family Medicine

## 2021-04-13 DIAGNOSIS — Z170 Estrogen receptor positive status [ER+]: Secondary | ICD-10-CM

## 2021-04-13 DIAGNOSIS — C50311 Malignant neoplasm of lower-inner quadrant of right female breast: Secondary | ICD-10-CM

## 2021-04-15 ENCOUNTER — Inpatient Hospital Stay (HOSPITAL_COMMUNITY)
Admission: EM | Admit: 2021-04-15 | Discharge: 2021-04-27 | DRG: 086 | Disposition: A | Discharge status: Skilled Nursing Facility | Payer: Medicare Other | Attending: Internal Medicine | Admitting: Internal Medicine

## 2021-04-15 ENCOUNTER — Other Ambulatory Visit: Payer: Self-pay

## 2021-04-15 ENCOUNTER — Emergency Department (HOSPITAL_COMMUNITY): Payer: Medicare Other

## 2021-04-15 ENCOUNTER — Encounter (HOSPITAL_COMMUNITY): Payer: Self-pay | Admitting: Emergency Medicine

## 2021-04-15 ENCOUNTER — Inpatient Hospital Stay (HOSPITAL_COMMUNITY): Payer: Medicare Other

## 2021-04-15 DIAGNOSIS — F32A Depression, unspecified: Secondary | ICD-10-CM | POA: Diagnosis present

## 2021-04-15 DIAGNOSIS — Y903 Blood alcohol level of 60-79 mg/100 ml: Secondary | ICD-10-CM | POA: Diagnosis present

## 2021-04-15 DIAGNOSIS — Z825 Family history of asthma and other chronic lower respiratory diseases: Secondary | ICD-10-CM

## 2021-04-15 DIAGNOSIS — Z515 Encounter for palliative care: Secondary | ICD-10-CM | POA: Diagnosis not present

## 2021-04-15 DIAGNOSIS — S065X0A Traumatic subdural hemorrhage without loss of consciousness, initial encounter: Principal | ICD-10-CM | POA: Diagnosis present

## 2021-04-15 DIAGNOSIS — R296 Repeated falls: Secondary | ICD-10-CM | POA: Diagnosis present

## 2021-04-15 DIAGNOSIS — E878 Other disorders of electrolyte and fluid balance, not elsewhere classified: Secondary | ICD-10-CM | POA: Diagnosis present

## 2021-04-15 DIAGNOSIS — Z781 Physical restraint status: Secondary | ICD-10-CM

## 2021-04-15 DIAGNOSIS — F10239 Alcohol dependence with withdrawal, unspecified: Secondary | ICD-10-CM | POA: Diagnosis not present

## 2021-04-15 DIAGNOSIS — F05 Delirium due to known physiological condition: Secondary | ICD-10-CM | POA: Diagnosis not present

## 2021-04-15 DIAGNOSIS — J431 Panlobular emphysema: Secondary | ICD-10-CM | POA: Diagnosis present

## 2021-04-15 DIAGNOSIS — Z7951 Long term (current) use of inhaled steroids: Secondary | ICD-10-CM

## 2021-04-15 DIAGNOSIS — Z751 Person awaiting admission to adequate facility elsewhere: Secondary | ICD-10-CM

## 2021-04-15 DIAGNOSIS — W19XXXS Unspecified fall, sequela: Secondary | ICD-10-CM | POA: Diagnosis not present

## 2021-04-15 DIAGNOSIS — G2401 Drug induced subacute dyskinesia: Secondary | ICD-10-CM | POA: Diagnosis present

## 2021-04-15 DIAGNOSIS — R4189 Other symptoms and signs involving cognitive functions and awareness: Secondary | ICD-10-CM | POA: Diagnosis present

## 2021-04-15 DIAGNOSIS — I10 Essential (primary) hypertension: Secondary | ICD-10-CM | POA: Diagnosis present

## 2021-04-15 DIAGNOSIS — F419 Anxiety disorder, unspecified: Secondary | ICD-10-CM | POA: Diagnosis present

## 2021-04-15 DIAGNOSIS — S065XAA Traumatic subdural hemorrhage with loss of consciousness status unknown, initial encounter: Principal | ICD-10-CM

## 2021-04-15 DIAGNOSIS — S01111A Laceration without foreign body of right eyelid and periocular area, initial encounter: Secondary | ICD-10-CM | POA: Diagnosis not present

## 2021-04-15 DIAGNOSIS — F10229 Alcohol dependence with intoxication, unspecified: Secondary | ICD-10-CM | POA: Diagnosis present

## 2021-04-15 DIAGNOSIS — R4182 Altered mental status, unspecified: Secondary | ICD-10-CM | POA: Diagnosis not present

## 2021-04-15 DIAGNOSIS — S40812A Abrasion of left upper arm, initial encounter: Secondary | ICD-10-CM | POA: Diagnosis present

## 2021-04-15 DIAGNOSIS — K529 Noninfective gastroenteritis and colitis, unspecified: Secondary | ICD-10-CM | POA: Diagnosis present

## 2021-04-15 DIAGNOSIS — W19XXXA Unspecified fall, initial encounter: Secondary | ICD-10-CM | POA: Diagnosis present

## 2021-04-15 DIAGNOSIS — E871 Hypo-osmolality and hyponatremia: Secondary | ICD-10-CM

## 2021-04-15 DIAGNOSIS — S0181XA Laceration without foreign body of other part of head, initial encounter: Secondary | ICD-10-CM | POA: Diagnosis present

## 2021-04-15 DIAGNOSIS — S41112A Laceration without foreign body of left upper arm, initial encounter: Secondary | ICD-10-CM | POA: Diagnosis present

## 2021-04-15 DIAGNOSIS — S41111A Laceration without foreign body of right upper arm, initial encounter: Secondary | ICD-10-CM | POA: Diagnosis present

## 2021-04-15 DIAGNOSIS — Z8042 Family history of malignant neoplasm of prostate: Secondary | ICD-10-CM

## 2021-04-15 DIAGNOSIS — F17210 Nicotine dependence, cigarettes, uncomplicated: Secondary | ICD-10-CM | POA: Diagnosis present

## 2021-04-15 DIAGNOSIS — Z886 Allergy status to analgesic agent status: Secondary | ICD-10-CM

## 2021-04-15 DIAGNOSIS — E538 Deficiency of other specified B group vitamins: Secondary | ICD-10-CM | POA: Diagnosis present

## 2021-04-15 DIAGNOSIS — G40909 Epilepsy, unspecified, not intractable, without status epilepticus: Secondary | ICD-10-CM | POA: Diagnosis present

## 2021-04-15 DIAGNOSIS — D539 Nutritional anemia, unspecified: Secondary | ICD-10-CM | POA: Diagnosis present

## 2021-04-15 DIAGNOSIS — K219 Gastro-esophageal reflux disease without esophagitis: Secondary | ICD-10-CM | POA: Diagnosis present

## 2021-04-15 DIAGNOSIS — D649 Anemia, unspecified: Secondary | ICD-10-CM | POA: Diagnosis present

## 2021-04-15 DIAGNOSIS — F1010 Alcohol abuse, uncomplicated: Secondary | ICD-10-CM

## 2021-04-15 DIAGNOSIS — F1029 Alcohol dependence with unspecified alcohol-induced disorder: Secondary | ICD-10-CM | POA: Diagnosis present

## 2021-04-15 DIAGNOSIS — Z79899 Other long term (current) drug therapy: Secondary | ICD-10-CM

## 2021-04-15 DIAGNOSIS — Z9882 Breast implant status: Secondary | ICD-10-CM

## 2021-04-15 DIAGNOSIS — J449 Chronic obstructive pulmonary disease, unspecified: Secondary | ICD-10-CM | POA: Diagnosis present

## 2021-04-15 DIAGNOSIS — Z789 Other specified health status: Secondary | ICD-10-CM

## 2021-04-15 DIAGNOSIS — Z853 Personal history of malignant neoplasm of breast: Secondary | ICD-10-CM

## 2021-04-15 DIAGNOSIS — R58 Hemorrhage, not elsewhere classified: Secondary | ICD-10-CM | POA: Diagnosis present

## 2021-04-15 DIAGNOSIS — E222 Syndrome of inappropriate secretion of antidiuretic hormone: Secondary | ICD-10-CM | POA: Diagnosis present

## 2021-04-15 DIAGNOSIS — Z8673 Personal history of transient ischemic attack (TIA), and cerebral infarction without residual deficits: Secondary | ICD-10-CM

## 2021-04-15 DIAGNOSIS — Z8371 Family history of colonic polyps: Secondary | ICD-10-CM

## 2021-04-15 DIAGNOSIS — Z0189 Encounter for other specified special examinations: Secondary | ICD-10-CM

## 2021-04-15 DIAGNOSIS — R404 Transient alteration of awareness: Secondary | ICD-10-CM | POA: Diagnosis not present

## 2021-04-15 DIAGNOSIS — Z20822 Contact with and (suspected) exposure to covid-19: Secondary | ICD-10-CM | POA: Diagnosis present

## 2021-04-15 DIAGNOSIS — Z803 Family history of malignant neoplasm of breast: Secondary | ICD-10-CM

## 2021-04-15 DIAGNOSIS — Z720 Tobacco use: Secondary | ICD-10-CM | POA: Diagnosis not present

## 2021-04-15 DIAGNOSIS — R569 Unspecified convulsions: Secondary | ICD-10-CM

## 2021-04-15 LAB — CBC WITH DIFFERENTIAL/PLATELET
Abs Immature Granulocytes: 0.09 10*3/uL — ABNORMAL HIGH (ref 0.00–0.07)
Basophils Absolute: 0 10*3/uL (ref 0.0–0.1)
Basophils Relative: 1 %
Eosinophils Absolute: 0 10*3/uL (ref 0.0–0.5)
Eosinophils Relative: 0 %
HCT: 29.7 % — ABNORMAL LOW (ref 36.0–46.0)
Hemoglobin: 10.1 g/dL — ABNORMAL LOW (ref 12.0–15.0)
Immature Granulocytes: 1 %
Lymphocytes Relative: 12 %
Lymphs Abs: 0.8 10*3/uL (ref 0.7–4.0)
MCH: 35.2 pg — ABNORMAL HIGH (ref 26.0–34.0)
MCHC: 34 g/dL (ref 30.0–36.0)
MCV: 103.5 fL — ABNORMAL HIGH (ref 80.0–100.0)
Monocytes Absolute: 1 10*3/uL (ref 0.1–1.0)
Monocytes Relative: 14 %
Neutro Abs: 5.1 10*3/uL (ref 1.7–7.7)
Neutrophils Relative %: 72 %
Platelets: 291 10*3/uL (ref 150–400)
RBC: 2.87 MIL/uL — ABNORMAL LOW (ref 3.87–5.11)
RDW: 13.5 % (ref 11.5–15.5)
WBC: 7 10*3/uL (ref 4.0–10.5)
nRBC: 0 % (ref 0.0–0.2)

## 2021-04-15 LAB — RESP PANEL BY RT-PCR (FLU A&B, COVID) ARPGX2
Influenza A by PCR: NEGATIVE
Influenza B by PCR: NEGATIVE
SARS Coronavirus 2 by RT PCR: NEGATIVE

## 2021-04-15 LAB — COMPREHENSIVE METABOLIC PANEL
ALT: 40 U/L (ref 0–44)
AST: 48 U/L — ABNORMAL HIGH (ref 15–41)
Albumin: 3 g/dL — ABNORMAL LOW (ref 3.5–5.0)
Alkaline Phosphatase: 75 U/L (ref 38–126)
Anion gap: 14 (ref 5–15)
BUN: 8 mg/dL (ref 8–23)
CO2: 26 mmol/L (ref 22–32)
Calcium: 8 mg/dL — ABNORMAL LOW (ref 8.9–10.3)
Chloride: 80 mmol/L — ABNORMAL LOW (ref 98–111)
Creatinine, Ser: 0.76 mg/dL (ref 0.44–1.00)
GFR, Estimated: >60 mL/min (ref >60)
Glucose, Bld: 60 mg/dL — ABNORMAL LOW (ref 70–99)
Potassium: 3.6 mmol/L (ref 3.5–5.1)
Sodium: 120 mmol/L — ABNORMAL LOW (ref 135–145)
Total Bilirubin: 0.6 mg/dL (ref 0.3–1.2)
Total Protein: 5.1 g/dL — ABNORMAL LOW (ref 6.5–8.1)

## 2021-04-15 LAB — MAGNESIUM: Magnesium: 1 mg/dL — ABNORMAL LOW (ref 1.7–2.4)

## 2021-04-15 LAB — ETHANOL: Alcohol, Ethyl (B): 62 mg/dL — ABNORMAL HIGH (ref <10)

## 2021-04-15 LAB — CBG MONITORING, ED: Glucose-Capillary: 80 mg/dL (ref 70–99)

## 2021-04-15 MED ORDER — LEVETIRACETAM 500 MG PO TABS
1000.0000 mg | ORAL_TABLET | Freq: Two times a day (BID) | ORAL | Status: DC
  Administered 2021-04-15 – 2021-04-27 (×24): 1000 mg via ORAL
  Filled 2021-04-15 (×24): qty 2

## 2021-04-15 MED ORDER — ACETAMINOPHEN 650 MG RE SUPP: 650.0000 mg | Freq: Four times a day (QID) | RECTAL | Status: DC | PRN

## 2021-04-15 MED ORDER — MAGNESIUM SULFATE 2 GM/50ML IV SOLN
2.0000 g | Freq: Once | INTRAVENOUS | Status: AC
  Administered 2021-04-15: 2 g via INTRAVENOUS
  Filled 2021-04-15: qty 50

## 2021-04-15 MED ORDER — FOLIC ACID 1 MG PO TABS
1.0000 mg | ORAL_TABLET | Freq: Every day | ORAL | Status: DC
  Administered 2021-04-15: 1 mg via ORAL
  Filled 2021-04-15: qty 1

## 2021-04-15 MED ORDER — MAGNESIUM HYDROXIDE 400 MG/5ML PO SUSP: 30.0000 mL | Freq: Every day | ORAL | Status: DC | PRN

## 2021-04-15 MED ORDER — ACETAMINOPHEN 325 MG PO TABS
650.0000 mg | ORAL_TABLET | Freq: Four times a day (QID) | ORAL | Status: DC | PRN
  Administered 2021-04-18 – 2021-04-27 (×15): 650 mg via ORAL
  Filled 2021-04-15 (×15): qty 2

## 2021-04-15 MED ORDER — SODIUM CHLORIDE 0.9 % IV SOLN: INTRAVENOUS | Status: DC

## 2021-04-15 MED ORDER — OXYBUTYNIN CHLORIDE ER 5 MG PO TB24
10.0000 mg | ORAL_TABLET | Freq: Every morning | ORAL | Status: DC
  Administered 2021-04-16 – 2021-04-27 (×12): 10 mg via ORAL
  Filled 2021-04-15 (×13): qty 2

## 2021-04-15 MED ORDER — ALBUTEROL SULFATE (2.5 MG/3ML) 0.083% IN NEBU: 3.0000 mL | INHALATION_SOLUTION | RESPIRATORY_TRACT | Status: DC | PRN

## 2021-04-15 MED ORDER — PHENOBARBITAL 16.2 MG PO TABS: 32.4000 mg | ORAL_TABLET | Freq: Two times a day (BID) | ORAL | Status: DC

## 2021-04-15 MED ORDER — DIPHENOXYLATE-ATROPINE 2.5-0.025 MG PO TABS
1.0000 | ORAL_TABLET | Freq: Four times a day (QID) | ORAL | Status: DC | PRN
  Administered 2021-04-26: 1 via ORAL
  Filled 2021-04-15: qty 1

## 2021-04-15 MED ORDER — SODIUM CHLORIDE 0.9 % IV SOLN
Freq: Every day | INTRAVENOUS | Status: DC
Start: 2021-04-15 — End: 2021-04-17
  Filled 2021-04-15 (×3): qty 1000

## 2021-04-15 MED ORDER — POTASSIUM CHLORIDE 20 MEQ PO PACK
40.0000 meq | PACK | Freq: Once | ORAL | Status: AC
Start: 2021-04-15 — End: 2021-04-15
  Administered 2021-04-15: 40 meq via ORAL
  Filled 2021-04-15: qty 2

## 2021-04-15 MED ORDER — METOPROLOL SUCCINATE ER 50 MG PO TB24
50.0000 mg | ORAL_TABLET | Freq: Every day | ORAL | Status: DC
  Administered 2021-04-15 – 2021-04-27 (×13): 50 mg via ORAL
  Filled 2021-04-15 (×13): qty 1

## 2021-04-15 MED ORDER — VITAMIN B-12 1000 MCG PO TABS
3000.0000 ug | ORAL_TABLET | Freq: Every morning | ORAL | Status: DC
  Administered 2021-04-16 – 2021-04-27 (×12): 3000 ug via ORAL
  Filled 2021-04-15 (×12): qty 3

## 2021-04-15 MED ORDER — TRAZODONE HCL 50 MG PO TABS
25.0000 mg | ORAL_TABLET | Freq: Every evening | ORAL | Status: DC | PRN
  Administered 2021-04-17: 25 mg via ORAL
  Filled 2021-04-15: qty 1

## 2021-04-15 MED ORDER — IOHEXOL 300 MG/ML  SOLN
100.0000 mL | Freq: Once | INTRAMUSCULAR | Status: AC | PRN
  Administered 2021-04-15: 100 mL via INTRAVENOUS

## 2021-04-15 MED ORDER — MELATONIN 5 MG PO TABS
10.0000 mg | ORAL_TABLET | Freq: Every day | ORAL | Status: DC
  Administered 2021-04-15 – 2021-04-26 (×11): 10 mg via ORAL
  Filled 2021-04-15 (×11): qty 2

## 2021-04-15 MED ORDER — LORAZEPAM 2 MG/ML IJ SOLN
1.0000 mg | INTRAMUSCULAR | Status: DC | PRN
  Administered 2021-04-15 – 2021-04-23 (×7): 1 mg via INTRAVENOUS
  Filled 2021-04-15 (×8): qty 1

## 2021-04-15 NOTE — H&P (Signed)
PATIENT NAME: Mindy Young    MR#:  [id number]  DATE OF BIRTH:  1945-07-05  DATE OF ADMISSION:  04/15/2021  PRIMARY CARE PHYSICIAN: Loura Pardon, MD   Patient is coming from: Home  REQUESTING/REFERRING PHYSICIAN: Sherrell Puller, PA-C  CHIEF COMPLAINT:   Chief Complaint  Patient presents with   Alcohol Intoxication   Fall    HISTORY OF PRESENT ILLNESS:  Mindy Young is a 76 y.o. Caucasian female with medical history significant for alcohol abuse, anxiety, depression, GERD, seizure disorder and previous SDH, hypertension, tardive dyskinesia and breast cancer status post lumpectomy, who presented to the emergency room with acute onset of recurrent falls.  She states that she loses her balance and denies any dizziness or presyncope or syncope, chest pain or palpitations.  She denies any paresthesias or focal muscle weakness.  She had a fall 3 days ago with subsequent right facial about 4 cm deep wound with good hemostasis.  She has chronic cough with her COPD with occasional expectoration of clear sputum without worsening.  No worsening dyspnea or wheezing.  No nausea or vomiting or abdominal pain.  She has chronic diarrhea which has been intermittent over the last several months.  No fever or chills.  No dysuria, oliguria or hematuria or flank pain.  ED Course: When she came to the ER, vital signs were within normal.  Labs revealed significant hyponatremia 120 and hypochloremia of 80 compared to 128 and 90 on 2/15, with a calcium of 8 and albumin of 3, total protein of 5.1 and magnesium level of 1 with a potassium of 3.6.  CBC showed anemia with hemoglobin of 10.1 and hematocrit 29.7 close to previous levels with macrocytosis.  Her blood glucose was 60 and alcohol abuse 62. EKG as reviewed by me : EKG showed normal sinus rhythm rate of 90 with left axis deviation and Q waves anteroseptally as well as inferiorly. Imaging: Head CT scan revealed acute subdural hemorrhage  layering along the left tentorial leaflet that is 3 mm thick and along the right cerebral convexity that is 2 mm thick with no significant mass effect.  It showed right posterior scalp contusion without acute fracture and chronic microvascular disease as well as cerebral atrophy. Maxillofacial CT which was motion limited without evidence of fracture. C-spine CT showed no evidence for acute fracture or traumatic malalignment.  It showed severe multilevel degenerative change and emphysema.  The patient was given 4 g of IV magnesium sulfate and started on hydration with IV normal saline.  Contact was made with neurosurgery and recommendation was for follow-up CT in 4 to 6 hours.  She will be admitted to a progressive unit bed for further evaluation and management.  PAST MEDICAL HISTORY:   Past Medical History:  Diagnosis Date   Alcohol abuse    Allergy    Anemia 06/29/2011   Anxiety    Arthritis    Breast cancer ([place])    right breast   C1 cervical fracture ([place]) 02/1997   Cancer ([place])    melanoma   Depression    Family history of breast cancer    Family history of prostate cancer    GERD (gastroesophageal reflux disease)    Hypertension    Tardive dyskinesia   -Seizure disorder  PAST SURGICAL HISTORY:   Past Surgical History:  Procedure Laterality Date   ABDOMINAL HYSTERECTOMY     ASPIRATION OF ABSCESS Right 11/20/2017   Procedure: ASPIRATION OF RIGHT  AXILLARY SEROMA;  Surgeon: Rolm Bookbinder, MD;  Location: [facility];  Service: General;  Laterality: Right;   AUGMENTATION MAMMAPLASTY Bilateral    biateral implants , approx 2015   BREAST LUMPECTOMY Right 11/02/2017   re-ex 11-20-17   BREAST LUMPECTOMY WITH RADIOACTIVE SEED AND SENTINEL LYMPH NODE BIOPSY Right 11/02/2017   Procedure: BREAST LUMPECTOMY WITH RADIOACTIVE SEED AND SENTINEL LYMPH NODE BIOPSY;  Surgeon: Rolm Bookbinder, MD;  Location: [facility];  Service: General;  Laterality: Right;    BRONCHIAL WASHINGS  02/21/2021   Procedure: BRONCHIAL WASHINGS;  Surgeon: Candee Furbish, MD;  Location: [facility] ENDOSCOPY;  Service: Pulmonary;;   CHOLECYSTECTOMY     collarbone     INCONTINENCE SURGERY     KNEE SURGERY     removal of cyst, repair of cartiledge   LAPAROSCOPY     for endometriosis   RE-EXCISION OF BREAST LUMPECTOMY Right 11/20/2017   Procedure: RE-EXCISION OF RIGHT BREAST MARGINS;  Surgeon: Rolm Bookbinder, MD;  Location: [facility];  Service: General;  Laterality: Right;   ROTATOR CUFF REPAIR     left   VIDEO BRONCHOSCOPY Right 02/21/2021   Procedure: VIDEO BRONCHOSCOPY WITHOUT FLUORO;  Surgeon: Candee Furbish, MD;  Location: [facility] ENDOSCOPY;  Service: Pulmonary;  Laterality: Right;    SOCIAL HISTORY:   Social History   Tobacco Use   Smoking status: Every Day    Packs/day: 1.00    Types: Cigarettes   Smokeless tobacco: Never   Tobacco comments:    e-cigs  Substance Use Topics   Alcohol use: Yes    Alcohol/week: 21.0 standard drinks    Types: 21 Standard drinks or equivalent per week    Comment: 2 per weekend day; denies during the week (not consistent with prior hx)    FAMILY HISTORY:   Family History  Problem Relation Age of Onset   COPD Mother    Prostate cancer Father 3       seed implant for treatment   Colon polyps Father        'a few'   Breast cancer Sister 84   Breast cancer Maternal Aunt        dx >50   Breast cancer Other 61       bilateral   Colon cancer Neg Hx     DRUG ALLERGIES:   Allergies  Allergen Reactions   Percocet [Oxycodone-Acetaminophen] Other (See Comments)    "bugs crawling on me"    REVIEW OF SYSTEMS:   ROS As per history of present illness. All pertinent systems were reviewed above. Constitutional, HEENT, cardiovascular, respiratory, GI, GU, musculoskeletal, neuro, psychiatric, endocrine, integumentary and hematologic systems were reviewed and are otherwise negative/unremarkable except for positive  findings mentioned above in the HPI.   MEDICATIONS AT HOME:   Prior to Admission medications   Medication Sig Start Date End Date Taking? Authorizing Provider  albuterol (VENTOLIN HFA) 108 (90 Base) MCG/ACT inhaler Inhale 1-2 puffs into the lungs every 4 (four) hours as needed for wheezing or shortness of breath. 10/28/20   [provider]  Cyanocobalamin 3000 MCG CAPS Take 3,000 mcg by mouth every morning. Vitamin B12    [provider]  diphenoxylate-atropine (LOMOTIL) 2.5-0.025 MG tablet Take 1 tablet by mouth 4 (four) times daily as needed for diarrhea or loose stools. 02/23/21   Geradine Girt, DO  fluticasone-salmeterol (ADVAIR) 250-50 MCG/ACT AEPB Inhale 1 puff into the lungs 2 (two) times daily as needed (shortness of breath/wheezing).  [provider]  folic acid (FOLVITE) 1 MG tablet Take 1 tablet (1 mg total) by mouth daily. 02/24/21   Geradine Girt, DO  guaiFENesin (MUCINEX) 600 MG 12 hr tablet Take 1 tablet (600 mg total) by mouth 2 (two) times daily. 02/23/21   Geradine Girt, DO  levETIRAcetam (KEPPRA) 1000 MG tablet Take 1 tablet (1,000 mg total) by mouth 2 (two) times daily. 03/04/21   Rondel Jumbo, PA-C  Melatonin 10 MG TABS Take 10 mg by mouth at bedtime.    [provider]  metoprolol succinate (TOPROL-XL) 50 MG 24 hr tablet Take 1 tablet (50 mg total) by mouth daily. Take with or immediately following a meal. 03/09/21 06/07/21  Loel Dubonnet, NP  ondansetron (ZOFRAN) 4 MG tablet Take 4 mg by mouth every 8 (eight) hours as needed for nausea or vomiting.    [provider]  oxybutynin (DITROPAN-XL) 10 MG 24 hr tablet Take 10 mg by mouth every morning. 12/05/16   [provider]  PHENobarbital (LUMINAL) 32.4 MG tablet Take 1 tablet (32.4 mg total) by mouth 2 (two) times daily. Patient not taking: Reported on 03/04/2021 02/23/21   Geradine Girt, DO  thiamine 100 MG tablet Take 1 tablet (100 mg total) by mouth daily. Patient  not taking: Reported on 03/04/2021 02/24/21   Geradine Girt, DO      VITAL SIGNS:  Blood pressure 137/79, pulse 79, temperature 99.2 F (37.3 C), resp. rate 20, height 5\' 2"  (1.575 m), weight 61.2 kg, SpO2 97 %.  PHYSICAL EXAMINATION:  Physical Exam  GENERAL:  76 y.o.-year-old Caucasian female patient lying in the bed with no acute distress.  EYES: Pupils equal, round, reactive to light and accommodation. No scleral icterus. Extraocular muscles intact.  HEENT: Head normocephalic with multiple facial contusions and right large longitudinal laceration with healing with good hemostasis near her right eyebrow as shown below. Oropharynx and nasopharynx clear.  NECK:  Supple, no jugular venous distention. No thyroid enlargement, no tenderness.  LUNGS: Normal breath sounds bilaterally, no wheezing, rales,rhonchi or crepitation. No use of accessory muscles of respiration.  CARDIOVASCULAR: Regular rate and rhythm, S1, S2 normal. No murmurs, rubs, or gallops.  ABDOMEN: Soft, nondistended, nontender. Bowel sounds present. No organomegaly or mass.  EXTREMITIES: No pedal edema, cyanosis, or clubbing.  NEUROLOGIC: Cranial nerves II through XII are intact. Muscle strength 5/5 in all extremities. Sensation intact. Gait not checked.  PSYCHIATRIC: The patient is alert and oriented x 3.  Normal affect and good eye contact. SKIN: Multiple upper and lower extremities contusions and skin tears at different stages of healing with scabbing and raw skin without clear evidence of infection as shown below.       LABORATORY PANEL:   CBC Recent Labs  Lab 04/15/21 1458  WBC 7.0  HGB 10.1*  HCT 29.7*  PLT 291   ------------------------------------------------------------------------------------------------------------------  Chemistries  Recent Labs  Lab 04/15/21 1458  NA 120*  K 3.6  CL 80*  CO2 26  GLUCOSE 60*  BUN 8  CREATININE 0.76  CALCIUM 8.0*  MG 1.0*  AST 48*  ALT 40  ALKPHOS 75   BILITOT 0.6   ------------------------------------------------------------------------------------------------------------------  Cardiac Enzymes No results for input(s): TROPONINI in the last 168 hours. ------------------------------------------------------------------------------------------------------------------  RADIOLOGY:  CT Head Wo Contrast  Result Date: 04/15/2021 CLINICAL DATA:  Head trauma, minor (Age >= 65y); Neck trauma (Age >= 65y); Facial trauma, blunt EXAM: CT HEAD WITHOUT CONTRAST CT MAXILLOFACIAL WITHOUT CONTRAST CT  CERVICAL SPINE WITHOUT CONTRAST TECHNIQUE: Multidetector CT imaging of the head, cervical spine, and maxillofacial structures were performed using the standard protocol without intravenous contrast. Multiplanar CT image reconstructions of the cervical spine and maxillofacial structures were also generated. RADIATION DOSE REDUCTION: This exam was performed according to the departmental dose-optimization program which includes automated exposure control, adjustment of the mA and/or kV according to patient size and/or use of iterative reconstruction technique. COMPARISON:  April 15, 2021 FINDINGS: CT HEAD FINDINGS Brain: Acute subdural hemorrhage layering along the left tentorial leaflet, measuring up to 3 mm in thickness. Thin (2 mm thick) acute subdural hemorrhage along the right frontotemporal convexity. No evidence of acute large vascular territory infarct. Moderate patchy and confluent white matter hypoattenuation, nonspecific but compatible with chronic microvascular ischemic disease. No midline shift, mass lesion, or hydrocephalus. Cerebral atrophy with ex vacuo ventricular dilation. Vascular: Calcific atherosclerosis. Skull: Right posterior scalp contusion without acute fracture. Other: No mastoid effusions. CT MAXILLOFACIAL FINDINGS Mildly motion limited. Osseous: No fracture or mandibular dislocation. No destructive process. Orbits: Negative. No traumatic or  inflammatory finding. Sinuses: Mucosal thickening of the inferior right maxillary sinus. Fluid layering in a small left sphenoid sinus. Otherwise, clear sinuses. Soft tissues: Negative. CT CERVICAL SPINE FINDINGS Alignment: Similar alignment, including mild anterolisthesis of C3 on C4, C4 on C5 and C7 on T1. broad levocurvature. Skull base and vertebrae: No evidence of acute fracture. Stable bony fusion across the C4-C5 disc space with similar anterior height loss and focal kyphosis at this level. No new vertebral body height loss. Soft tissues and spinal canal: No prevertebral fluid or swelling. No visible canal hematoma. Disc levels: Stable severe degenerative disc disease at C5-C6 and C6-C7. Similar severe multilevel facet arthropathy and severe craniocervical degenerative change. Upper chest: Emphysema with similar scarring in the lung apices. 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 IMPRESSION: CT head: 1. Acute subdural hemorrhage layering along the left tentorial leaflet (3 mm thick) and along the right cerebral convexity (2 mm thick). No significant mass effect. 2. Right posterior scalp contusion without acute fracture. 3. 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 CT maxillofacial: Motion limited without evidence of acute fracture. CT cervical spine: 1. No evidence of acute fracture or traumatic malalignment. 2. Similar severe multilevel degenerative change. 3.  Emphysema (ICD10-J43.9). Findings discussed with provider Norval Gable, [place] via telephone at 4:52 PM. Electronically Signed   By: Margaretha Sheffield M.D.   On: 04/15/2021 16:54   CT Cervical Spine Wo Contrast  Result Date: 04/15/2021 CLINICAL DATA:  Head trauma, minor (Age >= 65y); Neck trauma (Age >= 65y); Facial trauma, blunt EXAM: CT HEAD WITHOUT CONTRAST CT MAXILLOFACIAL WITHOUT CONTRAST CT CERVICAL SPINE WITHOUT CONTRAST TECHNIQUE: Multidetector CT imaging of the head, cervical spine, and maxillofacial structures were  performed using the standard protocol without intravenous contrast. Multiplanar CT image reconstructions of the cervical spine and maxillofacial structures were also generated. RADIATION DOSE REDUCTION: This exam was performed according to the departmental dose-optimization program which includes automated exposure control, adjustment of the mA and/or kV according to patient size and/or use of iterative reconstruction technique. COMPARISON:  April 15, 2021 FINDINGS: CT HEAD FINDINGS Brain: Acute subdural hemorrhage layering along the left tentorial leaflet, measuring up to 3 mm in thickness. Thin (2 mm thick) acute subdural hemorrhage along the right frontotemporal convexity. No evidence of acute large vascular territory infarct. Moderate patchy and confluent white matter hypoattenuation, nonspecific but compatible with chronic microvascular ischemic disease. No midline shift, mass lesion, or hydrocephalus. Cerebral  atrophy with ex vacuo ventricular dilation. Vascular: Calcific atherosclerosis. Skull: Right posterior scalp contusion without acute fracture. Other: No mastoid effusions. CT MAXILLOFACIAL FINDINGS Mildly motion limited. Osseous: No fracture or mandibular dislocation. No destructive process. Orbits: Negative. No traumatic or inflammatory finding. Sinuses: Mucosal thickening of the inferior right maxillary sinus. Fluid layering in a small left sphenoid sinus. Otherwise, clear sinuses. Soft tissues: Negative. CT CERVICAL SPINE FINDINGS Alignment: Similar alignment, including mild anterolisthesis of C3 on C4, C4 on C5 and C7 on T1. broad levocurvature. Skull base and vertebrae: No evidence of acute fracture. Stable bony fusion across the C4-C5 disc space with similar anterior height loss and focal kyphosis at this level. No new vertebral body height loss. Soft tissues and spinal canal: No prevertebral fluid or swelling. No visible canal hematoma. Disc levels: Stable severe degenerative disc disease at  C5-C6 and C6-C7. Similar severe multilevel facet arthropathy and severe craniocervical degenerative change. Upper chest: Emphysema with similar scarring in the lung apices. 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 IMPRESSION: CT head: 1. Acute subdural hemorrhage layering along the left tentorial leaflet (3 mm thick) and along the right cerebral convexity (2 mm thick). No significant mass effect. 2. Right posterior scalp contusion without acute fracture. 3. 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 CT maxillofacial: Motion limited without evidence of acute fracture. CT cervical spine: 1. No evidence of acute fracture or traumatic malalignment. 2. Similar severe multilevel degenerative change. 3.  Emphysema (ICD10-J43.9). Findings discussed with provider Norval Gable, [place] via telephone at 4:52 PM. Electronically Signed   By: Margaretha Sheffield M.D.   On: 04/15/2021 16:54   CT CHEST ABDOMEN PELVIS W CONTRAST  Result Date: 04/15/2021 CLINICAL DATA:  Trauma, fall EXAM: CT CHEST, ABDOMEN, AND PELVIS WITH CONTRAST TECHNIQUE: Multidetector CT imaging of the chest, abdomen and pelvis was performed following the standard protocol during bolus administration of intravenous contrast. RADIATION DOSE REDUCTION: This exam was performed according to the departmental dose-optimization program which includes automated exposure control, adjustment of the mA and/or kV according to patient size and/or use of iterative reconstruction technique. CONTRAST:  138mL OMNIPAQUE IOHEXOL 300 MG/ML  SOLN COMPARISON:  04/07/2021 FINDINGS: CT CHEST FINDINGS Cardiovascular: There is homogeneous enhancement in thoracic aorta. There are no filling defects in central pulmonary artery branches. Mediastinum/Nodes: There is no evidence of mediastinal hematoma. There is no significant lymphadenopathy. Lungs/Pleura: Severe centrilobular and panlobular emphysema is seen. There are patchy infiltrates in the right middle lobe and  right lower lobe with interval worsening of infiltrate in the right lower lobe. Small right pleural effusion is seen. Linear densities seen in both apices, more so on the right side with no significant interval change. There is no pneumothorax. Musculoskeletal: No displaced fractures are seen. CT ABDOMEN PELVIS FINDINGS Hepatobiliary: Liver measures 14.7 cm. There is fatty infiltration. There are few low-density lesions in the liver largest in the left lobe measuring approximately 1.4 cm. Gallbladder is not seen. There is no significant dilation of bile ducts. Pancreas: No focal abnormality is seen. Spleen: Unremarkable. Adrenals/Urinary Tract: Adrenals are unremarkable. There is no demonstrable cortical laceration. There is no hydronephrosis. Ureters are not dilated. Urinary bladder is unremarkable. Stomach/Bowel: Stomach is unremarkable. Small bowel loops are not dilated. Appendix is not distinctly seen. There is no pericecal inflammation. There is no significant wall thickening in colon. There is no pericolic stranding. Vascular/Lymphatic: There are scattered atherosclerotic plaques and calcifications in the aorta and its major branches. Reproductive: Uterus is not seen. Other: There is no  ascites or pneumoperitoneum. Small umbilical hernia containing fat is seen. Possible small right inguinal hernia containing fat is seen. Augmentation/reconstruction prostheses are seen in both breasts. Musculoskeletal: No displaced fractures are seen. Degenerative changes are noted in the lower lumbar spine with encroachment of neural foramina. There is first-degree spondylolisthesis and spinal stenosis at L4-L5 level. IMPRESSION: There is no evidence of mediastinal or retroperitoneal hematoma. There are linear patchy infiltrates in the right middle lobe and right lower lobe with interval worsening in the right lower lobe suggesting atelectasis/pneumonia. There is interval appearance of small right pleural effusion. There is no  demonstrable laceration in solid organs. There is no ascites or pneumoperitoneum. Fatty liver. There are low-density lesions in the liver suggesting cysts or hemangiomas or some other space-occupying neoplastic process. Severe centrilobular and panlobular emphysema. Lumbar spondylosis with spinal stenosis at L4-L5 level. Other findings as described in the body of the report. Electronically Signed   By: Elmer Picker M.D.   On: 04/15/2021 16:50   DG Humerus Left  Result Date: 04/15/2021 CLINICAL DATA:  Fall EXAM: LEFT HUMERUS - 2+ VIEW COMPARISON:  None. FINDINGS: There is no evidence of fracture or other focal bone lesions. Soft tissues are unremarkable. IMPRESSION: Negative. Electronically Signed   By: Franchot Gallo M.D.   On: 04/15/2021 15:44   DG Humerus Right  Result Date: 04/15/2021 CLINICAL DATA:  Fall EXAM: RIGHT HUMERUS - 2+ VIEW COMPARISON:  None. FINDINGS: There is no evidence of fracture or other focal bone lesions. Soft tissues are unremarkable. IMPRESSION: Negative. Electronically Signed   By: Franchot Gallo M.D.   On: 04/15/2021 15:43   CT US GUIDE VASC ACCESS LT NO REPORT  Result Date: 04/15/2021 There is no Radiologist interpretation  for this exam.  CT Maxillofacial WO CM  Result Date: 04/15/2021 CLINICAL DATA:  Head trauma, minor (Age >= 65y); Neck trauma (Age >= 65y); Facial trauma, blunt EXAM: CT HEAD WITHOUT CONTRAST CT MAXILLOFACIAL WITHOUT CONTRAST CT CERVICAL SPINE WITHOUT CONTRAST TECHNIQUE: Multidetector CT imaging of the head, cervical spine, and maxillofacial structures were performed using the standard protocol without intravenous contrast. Multiplanar CT image reconstructions of the cervical spine and maxillofacial structures were also generated. RADIATION DOSE REDUCTION: This exam was performed according to the departmental dose-optimization program which includes automated exposure control, adjustment of the mA and/or kV according to patient size and/or use of  iterative reconstruction technique. COMPARISON:  April 15, 2021 FINDINGS: CT HEAD FINDINGS Brain: Acute subdural hemorrhage layering along the left tentorial leaflet, measuring up to 3 mm in thickness. Thin (2 mm thick) acute subdural hemorrhage along the right frontotemporal convexity. No evidence of acute large vascular territory infarct. Moderate patchy and confluent white matter hypoattenuation, nonspecific but compatible with chronic microvascular ischemic disease. No midline shift, mass lesion, or hydrocephalus. Cerebral atrophy with ex vacuo ventricular dilation. Vascular: Calcific atherosclerosis. Skull: Right posterior scalp contusion without acute fracture. Other: No mastoid effusions. CT MAXILLOFACIAL FINDINGS Mildly motion limited. Osseous: No fracture or mandibular dislocation. No destructive process. Orbits: Negative. No traumatic or inflammatory finding. Sinuses: Mucosal thickening of the inferior right maxillary sinus. Fluid layering in a small left sphenoid sinus. Otherwise, clear sinuses. Soft tissues: Negative. CT CERVICAL SPINE FINDINGS Alignment: Similar alignment, including mild anterolisthesis of C3 on C4, C4 on C5 and C7 on T1. broad levocurvature. Skull base and vertebrae: No evidence of acute fracture. Stable bony fusion across the C4-C5 disc space with similar anterior height loss and focal kyphosis at this level. No new vertebral  body height loss. Soft tissues and spinal canal: No prevertebral fluid or swelling. No visible canal hematoma. Disc levels: Stable severe degenerative disc disease at C5-C6 and C6-C7. Similar severe multilevel facet arthropathy and severe craniocervical degenerative change. Upper chest: Emphysema with similar scarring in the lung apices. 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 IMPRESSION: CT head: 1. Acute subdural hemorrhage layering along the left tentorial leaflet (3 mm thick) and along the right cerebral convexity (2 mm thick). No significant mass  effect. 2. Right posterior scalp contusion without acute fracture. 3. 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 CT maxillofacial: Motion limited without evidence of acute fracture. CT cervical spine: 1. No evidence of acute fracture or traumatic malalignment. 2. Similar severe multilevel degenerative change. 3.  Emphysema (ICD10-J43.9). Findings discussed with provider Norval Gable, [place] via telephone at 4:52 PM. Electronically Signed   By: Margaretha Sheffield M.D.   On: 04/15/2021 16:54      IMPRESSION AND PLAN:  Principal Problem:   Subdural hematoma 1.  Recurrent falls with subsequent subdural hematoma and multiple facial upper and lower extremities contusions and skin tears with longitudinal right temporal laceration with adequate hemostasis. - The patient will be admitted to a progressive unit bed. - We will follow frequent neurochecks. - She will have a follow-up noncontrast head CT scan about 4 hours from the last CT. - We will avoid blood thinners. - We will obtain a neurosurgery consult. - Mr. Glenford Peers was notified about the patient I will follow with [place]. - General surgery consult to be obtained for her right facial wound.  2.  Hyponatremia and hypomagnesemia. - She will be hydrated with IV normal saline. - Magnesium level was aggressively placed. - We will optimize her potassium as well.  3.  Alcohol abuse clearly the culprit for #1. - We will continue on Campral. - I counseled her for alcohol cessation. - She will be placed on as needed IV Ativan for alcohol withdrawal. - She will be given a banana bag daily.  4.  Essential hypertension. - We will continue her antihypertensives.  5.  COPD without exacerbation. - Continue her inhalers.  6.  Vitamin B12 deficiency. - Continue vitamin B12.  7.  History of seizure disorder and previous SDH. - We will continue her living now on Keppra.   DVT prophylaxis: SCDs.  Medical prophylaxis contraindicated due to her  SDH. Advanced Care Planning:  Code Status: full code. Family Communication:  The plan of care was discussed in details with the patient (and family). I answered all questions. The patient agreed to proceed with the above mentioned plan. Further management will depend upon hospital course. Disposition Plan: Back to previous home environment Consults called: Neurosurgery and general surgery. All the records are reviewed and case discussed with ED provider.  Status is: Inpatient  At the time of the admission, it appears that the appropriate admission status for this patient is inpatient.  This is judged to be reasonable and necessary in order to provide the required intensity of service to ensure the patient's safety given the presenting symptoms, physical exam findings and initial radiographic and laboratory data in the context of comorbid conditions.  The patient requires inpatient status due to high intensity of service, high risk of further deterioration and high frequency of surveillance required.  I certify that at the time of admission, it is my clinical judgment that the patient will require inpatient hospital care extending more than 2 midnights.  Dispo: The patient is from: Home              Anticipated d/c is to: Home              Patient currently is not medically stable to d/c.              Difficult to place patient: No  Christel Mormon M.D on 04/15/2021 at 7:43 PM  [facility]   From 7 PM-7 AM, contact night-coverage [email or web address]  CC: Primary care physician; Loura Pardon, MD

## 2021-04-15 NOTE — ED Provider Triage Note (Signed)
Emergency Medicine Provider Triage Evaluation Note  Mindy Young , a 76 y.o. female  was evaluated in triage.  Pt complains of falls.  Patient has had frequent falls due to alcohol intoxication recently.  She had a fall 2 days ago and again today.  She states that she has headache and neck pain. She is currently intoxicated. Unclear history or mechanism. Per EMS, her caregiver states that she is at her normal mentation  Review of Systems  Positive: Negative:   Physical Exam  BP 112/78 (BP Location: Left Arm)    Pulse (!) 109    Temp 99.2 F (37.3 C)    Resp 20    Ht 5\' 2"  (1.575 m)    Wt 61.2 kg    SpO2 95%    BMI 24.69 kg/m  Gen:   Awake, no distress   Resp:  Normal effort  MSK:   Moves extremities without difficulty  Other:  Slurred speech noted, likely intoxication. Gaping Laceration to right forehead that appears old. Some tenderness and abrasion to right humerus. Multiple abrasions and bruising at different healing stages.   Medical Decision Making  Medically screening exam initiated at 2:58 PM.  Appropriate orders placed.  Mindy Young was informed that the remainder of the evaluation will be completed by another provider, this initial triage assessment does not replace that evaluation, and the importance of remaining in the ED until their evaluation is complete.  Patient intoxicated. Unclear history. Obvious trauma. Patient will likely require pan scanning given distracting injury and intoxication.    Adolphus Birchwood, [place] 04/15/21 1502

## 2021-04-15 NOTE — ED Provider Notes (Signed)
[facility] EMERGENCY DEPARTMENT Provider Note   CSN: [id number] Arrival date & time: 04/15/21  1440     History Chief Complaint  Patient presents with   Alcohol Intoxication   Fall    Mindy Young is a 76 y.o. female history of severe alcohol use disorder, frequent falls, COPD, since the emergency department unaccompanied by EMS for evaluation after a fall.  History obtained by both the patient and patient's caretaker Mindy Young.  The patient reports that she had a fall today around 10:00 and she hit her head on her slight floors.  She denies any LOC.  She reports she has some head pain mainly on the right side.  She denies any visual changes.  She denies any chest pain or shortness of breath.  I asked the patient if she drinks alcohol and she says only 1 shot at night.  The patient appears intoxicated so rest of history obtained by patient's caregiver via phone call.  The caretaker reports that she has had the laceration for the past few days and was given mupirocin for it.  She reports she has multiple falls daily that she likely suspects is due to her intoxication as she drinks multiple fifths of liquor daily.  She recently started taking acamprosate for her alcohol use disorder by her primary care provider.  The caretaker has been starting to take the bottle of alcohol away from her in hopes of weaning her from the alcohol.  She denies any witnessed seizure-like activity per the caregiver.  The caregiver denies that she is on any blood thinner such as Xarelto, Eliquis, Coumadin, or warfarin.  She denies any antiplatelet use such as Plavix, clopidogrel, or aspirin.   Alcohol Intoxication Associated symptoms include headaches.  Fall Associated symptoms include headaches.      Home Medications Prior to Admission medications   Medication Sig Start Date End Date Taking? Authorizing Provider  albuterol (VENTOLIN HFA) 108 (90 Base) MCG/ACT inhaler Inhale 1-2 puffs into  the lungs every 4 (four) hours as needed for wheezing or shortness of breath. 10/28/20   [provider]  Cyanocobalamin 3000 MCG CAPS Take 3,000 mcg by mouth every morning. Vitamin B12    [provider]  diphenoxylate-atropine (LOMOTIL) 2.5-0.025 MG tablet Take 1 tablet by mouth 4 (four) times daily as needed for diarrhea or loose stools. 02/23/21   Geradine Girt, DO  fluticasone-salmeterol (ADVAIR) 250-50 MCG/ACT AEPB Inhale 1 puff into the lungs 2 (two) times daily as needed (shortness of breath/wheezing).    [provider]  folic acid (FOLVITE) 1 MG tablet Take 1 tablet (1 mg total) by mouth daily. 02/24/21   Geradine Girt, DO  guaiFENesin (MUCINEX) 600 MG 12 hr tablet Take 1 tablet (600 mg total) by mouth 2 (two) times daily. 02/23/21   Geradine Girt, DO  levETIRAcetam (KEPPRA) 1000 MG tablet Take 1 tablet (1,000 mg total) by mouth 2 (two) times daily. 03/04/21   Rondel Jumbo, PA-C  Melatonin 10 MG TABS Take 10 mg by mouth at bedtime.    [provider]  metoprolol succinate (TOPROL-XL) 50 MG 24 hr tablet Take 1 tablet (50 mg total) by mouth daily. Take with or immediately following a meal. 03/09/21 06/07/21  Loel Dubonnet, NP  ondansetron (ZOFRAN) 4 MG tablet Take 4 mg by mouth every 8 (eight) hours as needed for nausea or vomiting.    [provider]  oxybutynin (DITROPAN-XL) 10 MG 24 hr tablet  Take 10 mg by mouth every morning. 12/05/16   [provider]  PHENobarbital (LUMINAL) 32.4 MG tablet Take 1 tablet (32.4 mg total) by mouth 2 (two) times daily. Patient not taking: Reported on 03/04/2021 02/23/21   Geradine Girt, DO  thiamine 100 MG tablet Take 1 tablet (100 mg total) by mouth daily. Patient not taking: Reported on 03/04/2021 02/24/21   Geradine Girt, DO      Allergies    Percocet [oxycodone-acetaminophen]    Review of Systems   Review of Systems  Musculoskeletal:  Positive for myalgias. Negative for back pain and neck pain.   Skin:  Positive for wound.  Neurological:  Positive for headaches.   Physical Exam Updated Vital Signs BP 137/79    Pulse 79    Temp 99.2 F (37.3 C)    Resp 20    Ht 5\' 2"  (1.575 m)    Wt 61.2 kg    SpO2 97%    BMI 24.69 kg/m  Physical Exam Vitals and nursing note reviewed.  Constitutional:      General: She is not in acute distress.    Appearance: She is not toxic-appearing.     Comments: Disheveled.   HENT:     Head: Normocephalic.      Comments: Small hematoma with overlying abrasion to the right parietal scalp.   Laceration noted to the right side of face near her eye that appears old.  Not bleeding.  Bruising noted to face as well.  She has some mild maxillary tenderness on the right but no deformity or step-off palpated.  She can fully open and close her mouth without pain    Right Ear: Tympanic membrane, ear canal and external ear normal.     Left Ear: Tympanic membrane, ear canal and external ear normal.     Nose: Nose normal.     Mouth/Throat:     Mouth: Mucous membranes are moist.  Eyes:     Extraocular Movements: Extraocular movements intact.     Pupils: Pupils are equal, round, and reactive to light.  Pulmonary:     Effort: Pulmonary effort is normal. No respiratory distress.  Abdominal:     General: Bowel sounds are normal.     Palpations: Abdomen is soft.     Tenderness: There is no abdominal tenderness. There is no guarding or rebound.  Musculoskeletal:     Comments:   BACK -no midline or paraspinal cervical, thoracic, or lumbar tenderness palpation.  No bony step-offs or deformities noted.  No overlying skin changes, other than frog tattoo, visualized. No erythema or overlying warmth noted. Able to rise from a sitting position without assistance.  LUE- No snuffbox tenderness. Multiple abrasions and contusions at various stages of healing. No new acute abrasions of skin tears noted. Non-tender. Compartments soft. Palpable pulses.   RUE- No snuffbox  tenderness. Multiple abrasions and contusions at various stages of healing. No new acute abrasions of skin tears noted. Non-tender. Compartments soft. Palpable pulses.   LLE- No edema noted. Multiple abrasions and contusions at various stages of healing. No new acute abrasions of skin tears noted. Non-tender. Compartments soft. Palpable pulses. Full ROM.   RLE- No edema noted. Multiple abrasions and contusions at various stages of healing. No new acute abrasions of skin tears noted. Non-tender. Compartments soft. Palpable pulses. Full ROM.     Neurological:     General: No focal deficit present.     Mental Status: She is oriented to person, place,  and time.     GCS: GCS eye subscore is 4. GCS verbal subscore is 5. GCS motor subscore is 6.     Cranial Nerves: No cranial nerve deficit.     Sensory: No sensory deficit.     Motor: No weakness.     Coordination: Coordination normal. Finger-Nose-Finger Test and Heel to Mercer Test normal.     Comments: Is answering questions appropriately with appropriate speech.  Mild delay in her speech, which may be due to slight intoxication or old age.    ED Results / Procedures / Treatments   Labs (all labs ordered are listed, but only abnormal results are displayed) Labs Reviewed  COMPREHENSIVE METABOLIC PANEL - Abnormal; Notable for the following components:      Result Value   Sodium 120 (*)    Chloride 80 (*)    Glucose, Bld 60 (*)    Calcium 8.0 (*)    Total Protein 5.1 (*)    Albumin 3.0 (*)    AST 48 (*)    All other components within normal limits  CBC WITH DIFFERENTIAL/PLATELET - Abnormal; Notable for the following components:   RBC 2.87 (*)    Hemoglobin 10.1 (*)    HCT 29.7 (*)    MCV 103.5 (*)    MCH 35.2 (*)    Abs Immature Granulocytes 0.09 (*)    All other components within normal limits  ETHANOL - Abnormal; Notable for the following components:   Alcohol, Ethyl (B) 62 (*)    All other components within normal limits  MAGNESIUM  - Abnormal; Notable for the following components:   Magnesium 1.0 (*)    All other components within normal limits    EKG None  Radiology CT Head Wo Contrast  Result Date: 04/15/2021 CLINICAL DATA:  Head trauma, minor (Age >= 65y); Neck trauma (Age >= 65y); Facial trauma, blunt EXAM: CT HEAD WITHOUT CONTRAST CT MAXILLOFACIAL WITHOUT CONTRAST CT CERVICAL SPINE WITHOUT CONTRAST TECHNIQUE: Multidetector CT imaging of the head, cervical spine, and maxillofacial structures were performed using the standard protocol without intravenous contrast. Multiplanar CT image reconstructions of the cervical spine and maxillofacial structures were also generated. RADIATION DOSE REDUCTION: This exam was performed according to the departmental dose-optimization program which includes automated exposure control, adjustment of the mA and/or kV according to patient size and/or use of iterative reconstruction technique. COMPARISON:  April 15, 2021 FINDINGS: CT HEAD FINDINGS Brain: Acute subdural hemorrhage layering along the left tentorial leaflet, measuring up to 3 mm in thickness. Thin (2 mm thick) acute subdural hemorrhage along the right frontotemporal convexity. No evidence of acute large vascular territory infarct. Moderate patchy and confluent white matter hypoattenuation, nonspecific but compatible with chronic microvascular ischemic disease. No midline shift, mass lesion, or hydrocephalus. Cerebral atrophy with ex vacuo ventricular dilation. Vascular: Calcific atherosclerosis. Skull: Right posterior scalp contusion without acute fracture. Other: No mastoid effusions. CT MAXILLOFACIAL FINDINGS Mildly motion limited. Osseous: No fracture or mandibular dislocation. No destructive process. Orbits: Negative. No traumatic or inflammatory finding. Sinuses: Mucosal thickening of the inferior right maxillary sinus. Fluid layering in a small left sphenoid sinus. Otherwise, clear sinuses. Soft tissues: Negative. CT CERVICAL  SPINE FINDINGS Alignment: Similar alignment, including mild anterolisthesis of C3 on C4, C4 on C5 and C7 on T1. broad levocurvature. Skull base and vertebrae: No evidence of acute fracture. Stable bony fusion across the C4-C5 disc space with similar anterior height loss and focal kyphosis at this level. No new vertebral body height loss. Soft  tissues and spinal canal: No prevertebral fluid or swelling. No visible canal hematoma. Disc levels: Stable severe degenerative disc disease at C5-C6 and C6-C7. Similar severe multilevel facet arthropathy and severe craniocervical degenerative change. Upper chest: Emphysema with similar scarring in the lung apices. 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 IMPRESSION: CT head: 1. Acute subdural hemorrhage layering along the left tentorial leaflet (3 mm thick) and along the right cerebral convexity (2 mm thick). No significant mass effect. 2. Right posterior scalp contusion without acute fracture. 3. 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 CT maxillofacial: Motion limited without evidence of acute fracture. CT cervical spine: 1. No evidence of acute fracture or traumatic malalignment. 2. Similar severe multilevel degenerative change. 3.  Emphysema (ICD10-J43.9). Findings discussed with provider Norval Gable, [place] via telephone at 4:52 PM. Electronically Signed   By: Margaretha Sheffield M.D.   On: 04/15/2021 16:54   CT Cervical Spine Wo Contrast  Result Date: 04/15/2021 CLINICAL DATA:  Head trauma, minor (Age >= 65y); Neck trauma (Age >= 65y); Facial trauma, blunt EXAM: CT HEAD WITHOUT CONTRAST CT MAXILLOFACIAL WITHOUT CONTRAST CT CERVICAL SPINE WITHOUT CONTRAST TECHNIQUE: Multidetector CT imaging of the head, cervical spine, and maxillofacial structures were performed using the standard protocol without intravenous contrast. Multiplanar CT image reconstructions of the cervical spine and maxillofacial structures were also generated. RADIATION DOSE  REDUCTION: This exam was performed according to the departmental dose-optimization program which includes automated exposure control, adjustment of the mA and/or kV according to patient size and/or use of iterative reconstruction technique. COMPARISON:  April 15, 2021 FINDINGS: CT HEAD FINDINGS Brain: Acute subdural hemorrhage layering along the left tentorial leaflet, measuring up to 3 mm in thickness. Thin (2 mm thick) acute subdural hemorrhage along the right frontotemporal convexity. No evidence of acute large vascular territory infarct. Moderate patchy and confluent white matter hypoattenuation, nonspecific but compatible with chronic microvascular ischemic disease. No midline shift, mass lesion, or hydrocephalus. Cerebral atrophy with ex vacuo ventricular dilation. Vascular: Calcific atherosclerosis. Skull: Right posterior scalp contusion without acute fracture. Other: No mastoid effusions. CT MAXILLOFACIAL FINDINGS Mildly motion limited. Osseous: No fracture or mandibular dislocation. No destructive process. Orbits: Negative. No traumatic or inflammatory finding. Sinuses: Mucosal thickening of the inferior right maxillary sinus. Fluid layering in a small left sphenoid sinus. Otherwise, clear sinuses. Soft tissues: Negative. CT CERVICAL SPINE FINDINGS Alignment: Similar alignment, including mild anterolisthesis of C3 on C4, C4 on C5 and C7 on T1. broad levocurvature. Skull base and vertebrae: No evidence of acute fracture. Stable bony fusion across the C4-C5 disc space with similar anterior height loss and focal kyphosis at this level. No new vertebral body height loss. Soft tissues and spinal canal: No prevertebral fluid or swelling. No visible canal hematoma. Disc levels: Stable severe degenerative disc disease at C5-C6 and C6-C7. Similar severe multilevel facet arthropathy and severe craniocervical degenerative change. Upper chest: Emphysema with similar scarring in the lung apices. Chronic nonunion of a  left clavicular fracture. IMPRESSION: CT head: 1. Acute subdural hemorrhage layering along the left tentorial leaflet (3 mm thick) and along the right cerebral convexity (2 mm thick). No significant mass effect. 2. Right posterior scalp contusion without acute fracture. 3. 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 CT maxillofacial: Motion limited without evidence of acute fracture. CT cervical spine: 1. No evidence of acute fracture or traumatic malalignment. 2. Similar severe multilevel degenerative change. 3.  Emphysema (ICD10-J43.9). Findings discussed with provider Norval Gable, [place] via telephone at 4:52 PM. Electronically Signed  By: Margaretha Sheffield M.D.   On: 04/15/2021 16:54   CT CHEST ABDOMEN PELVIS W CONTRAST  Result Date: 04/15/2021 CLINICAL DATA:  Trauma, fall EXAM: CT CHEST, ABDOMEN, AND PELVIS WITH CONTRAST TECHNIQUE: Multidetector CT imaging of the chest, abdomen and pelvis was performed following the standard protocol during bolus administration of intravenous contrast. RADIATION DOSE REDUCTION: This exam was performed according to the departmental dose-optimization program which includes automated exposure control, adjustment of the mA and/or kV according to patient size and/or use of iterative reconstruction technique. CONTRAST:  163mL OMNIPAQUE IOHEXOL 300 MG/ML  SOLN COMPARISON:  04/07/2021 FINDINGS: CT CHEST FINDINGS Cardiovascular: There is homogeneous enhancement in thoracic aorta. There are no filling defects in central pulmonary artery branches. Mediastinum/Nodes: There is no evidence of mediastinal hematoma. There is no significant lymphadenopathy. Lungs/Pleura: Severe centrilobular and panlobular emphysema is seen. There are patchy infiltrates in the right middle lobe and right lower lobe with interval worsening of infiltrate in the right lower lobe. Small right pleural effusion is seen. Linear densities seen in both apices, more so on the right side with no  significant interval change. There is no pneumothorax. Musculoskeletal: No displaced fractures are seen. CT ABDOMEN PELVIS FINDINGS Hepatobiliary: Liver measures 14.7 cm. There is fatty infiltration. There are few low-density lesions in the liver largest in the left lobe measuring approximately 1.4 cm. Gallbladder is not seen. There is no significant dilation of bile ducts. Pancreas: No focal abnormality is seen. Spleen: Unremarkable. Adrenals/Urinary Tract: Adrenals are unremarkable. There is no demonstrable cortical laceration. There is no hydronephrosis. Ureters are not dilated. Urinary bladder is unremarkable. Stomach/Bowel: Stomach is unremarkable. Small bowel loops are not dilated. Appendix is not distinctly seen. There is no pericecal inflammation. There is no significant wall thickening in colon. There is no pericolic stranding. Vascular/Lymphatic: There are scattered atherosclerotic plaques and calcifications in the aorta and its major branches. Reproductive: Uterus is not seen. Other: There is no ascites or pneumoperitoneum. Small umbilical hernia containing fat is seen. Possible small right inguinal hernia containing fat is seen. Augmentation/reconstruction prostheses are seen in both breasts. Musculoskeletal: No displaced fractures are seen. Degenerative changes are noted in the lower lumbar spine with encroachment of neural foramina. There is first-degree spondylolisthesis and spinal stenosis at L4-L5 level. IMPRESSION: There is no evidence of mediastinal or retroperitoneal hematoma. There are linear patchy infiltrates in the right middle lobe and right lower lobe with interval worsening in the right lower lobe suggesting atelectasis/pneumonia. There is interval appearance of small right pleural effusion. There is no demonstrable laceration in solid organs. There is no ascites or pneumoperitoneum. Fatty liver. There are low-density lesions in the liver suggesting cysts or hemangiomas or some other  space-occupying neoplastic process. Severe centrilobular and panlobular emphysema. Lumbar spondylosis with spinal stenosis at L4-L5 level. Other findings as described in the body of the report. Electronically Signed   By: Elmer Picker M.D.   On: 04/15/2021 16:50   DG Humerus Left  Result Date: 04/15/2021 CLINICAL DATA:  Fall EXAM: LEFT HUMERUS - 2+ VIEW COMPARISON:  None. FINDINGS: There is no evidence of fracture or other focal bone lesions. Soft tissues are unremarkable. IMPRESSION: Negative. Electronically Signed   By: Franchot Gallo M.D.   On: 04/15/2021 15:44   DG Humerus Right  Result Date: 04/15/2021 CLINICAL DATA:  Fall EXAM: RIGHT HUMERUS - 2+ VIEW COMPARISON:  None. FINDINGS: There is no evidence of fracture or other focal bone lesions. Soft tissues are unremarkable. IMPRESSION: Negative. Electronically Signed  By: Franchot Gallo M.D.   On: 04/15/2021 15:43   CT US GUIDE VASC ACCESS LT NO REPORT  Result Date: 04/15/2021 There is no Radiologist interpretation  for this exam.  CT Maxillofacial WO CM  Result Date: 04/15/2021 CLINICAL DATA:  Head trauma, minor (Age >= 65y); Neck trauma (Age >= 65y); Facial trauma, blunt EXAM: CT HEAD WITHOUT CONTRAST CT MAXILLOFACIAL WITHOUT CONTRAST CT CERVICAL SPINE WITHOUT CONTRAST TECHNIQUE: Multidetector CT imaging of the head, cervical spine, and maxillofacial structures were performed using the standard protocol without intravenous contrast. Multiplanar CT image reconstructions of the cervical spine and maxillofacial structures were also generated. RADIATION DOSE REDUCTION: This exam was performed according to the departmental dose-optimization program which includes automated exposure control, adjustment of the mA and/or kV according to patient size and/or use of iterative reconstruction technique. COMPARISON:  April 15, 2021 FINDINGS: CT HEAD FINDINGS Brain: Acute subdural hemorrhage layering along the left tentorial leaflet, measuring up to  3 mm in thickness. Thin (2 mm thick) acute subdural hemorrhage along the right frontotemporal convexity. No evidence of acute large vascular territory infarct. Moderate patchy and confluent white matter hypoattenuation, nonspecific but compatible with chronic microvascular ischemic disease. No midline shift, mass lesion, or hydrocephalus. Cerebral atrophy with ex vacuo ventricular dilation. Vascular: Calcific atherosclerosis. Skull: Right posterior scalp contusion without acute fracture. Other: No mastoid effusions. CT MAXILLOFACIAL FINDINGS Mildly motion limited. Osseous: No fracture or mandibular dislocation. No destructive process. Orbits: Negative. No traumatic or inflammatory finding. Sinuses: Mucosal thickening of the inferior right maxillary sinus. Fluid layering in a small left sphenoid sinus. Otherwise, clear sinuses. Soft tissues: Negative. CT CERVICAL SPINE FINDINGS Alignment: Similar alignment, including mild anterolisthesis of C3 on C4, C4 on C5 and C7 on T1. broad levocurvature. Skull base and vertebrae: No evidence of acute fracture. Stable bony fusion across the C4-C5 disc space with similar anterior height loss and focal kyphosis at this level. No new vertebral body height loss. Soft tissues and spinal canal: No prevertebral fluid or swelling. No visible canal hematoma. Disc levels: Stable severe degenerative disc disease at C5-C6 and C6-C7. Similar severe multilevel facet arthropathy and severe craniocervical degenerative change. Upper chest: Emphysema with similar scarring in the lung apices. 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 IMPRESSION: CT head: 1. Acute subdural hemorrhage layering along the left tentorial leaflet (3 mm thick) and along the right cerebral convexity (2 mm thick). No significant mass effect. 2. Right posterior scalp contusion without acute fracture. 3. 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 CT maxillofacial: Motion limited without evidence  of acute fracture. CT cervical spine: 1. No evidence of acute fracture or traumatic malalignment. 2. Similar severe multilevel degenerative change. 3.  Emphysema (ICD10-J43.9). Findings discussed with provider Norval Gable, [place] via telephone at 4:52 PM. Electronically Signed   By: Margaretha Sheffield M.D.   On: 04/15/2021 16:54    Procedures .Critical Care Performed by: Sherrell Puller, PA-C Authorized by: Sherrell Puller, PA-C   Critical care provider statement:    Critical care time (minutes):  48   Critical care was necessary to treat or prevent imminent or life-threatening deterioration of the following conditions:  Trauma (Hyponatremia)   Critical care was time spent personally by me on the following activities:  Development of treatment plan with patient or surrogate, discussions with consultants, evaluation of patient's response to treatment, examination of patient, ordering and review of laboratory studies, ordering and review of radiographic studies, ordering and performing treatments and interventions, pulse oximetry, re-evaluation of patient's condition,  review of old charts and obtaining history from patient or surrogate   Care discussed with: admitting provider     Medications Ordered in ED Medications  magnesium sulfate IVPB 2 g 50 mL (has no administration in time range)  0.9 %  sodium chloride infusion (has no administration in time range)  iohexol (OMNIPAQUE) 300 MG/ML solution 100 mL (100 mLs Intravenous Contrast Given 04/15/21 1635)  magnesium sulfate IVPB 2 g 50 mL (2 g Intravenous New Bag/Given 04/15/21 1807)    ED Course/ Medical Decision Making/ A&P                           Medical Decision Making Amount and/or Complexity of Data Reviewed Radiology: ordered.  Risk Prescription drug management. Decision regarding hospitalization.   76 year old female history of severe alcohol use disorder, frequent falls, COPD, since the emergency department unaccompanied by EMS for  evaluation after a fall.  Differential diagnosis includes but is not limited to facial fracture, brain bleed, chest abdominal trauma, DTs.  Vital signs are stable.  Patient normotensive, afebrile, normal pulse rate, satting well on room air without any increased work of breathing.  Physical exam is pertinent for an old laceration to the right side of her face beside her eye with some mild maxillary tenderness without any step-off or deformity.  She has no cervical, thoracic, lumbar tenderness to palpation of her midline or paraspinal area.  No overlying skin changes, erythema, or increased warmth present.  Patient is able to move from a sitting position without assistance.  She has multiple healing abrasions and contusions throughout her upper and lower extremities that are in various stages of healing.  I do not appreciate any new skin tears.  She has no abdominal tenderness palpation.  No chest wall tenderness.  She has equal strength in her upper and lower bilateral extremities.  Cranial nerves II through XII intact.  She is answering questions appropriately and with appropriate speech although a minor delay in answering.  She is alert and oriented x3.  She has a small hematoma to the right side of her head with an overlying abrasion.  No palpable deformity or step-off noted here as well.  She has palpable DP, PT, and radial pulses.  She is complaining of a headache.  Denies any visual complaints, neck pain, back pain, abdominal pain, chest pain, or shortness of breath.  History obtained by both the patient and caregiver, Mindy Young.  Labs and imaging ordered in triage.  I independently reviewed and interpreted the patient's labs and imaging and agree with the radiologist interpretation.  CBC shows mild anemia with a hemoglobin of 10.1.  Decreased on her CBC 5 days ago that showed 10.5.  CMP shows significant hyponatremia at 120 although patient is usually hyponatremic at 128.  Decreased chloride at 80.   Decreased glucose at 60.  Decreased calcium, protein, albumin.  Mildly elevated AST at 48.  Magnesium is significantly low at 1.0.  Ethanol positive at 62.  Negative for COVID and flu.  Recheck CBG after crackers and peanut butter at 80.  I brought the patient more food. CT head shows acute subdural hemorrhage layering along the left tentorial leaflet 3 mm thick and along the right cerebral convexity at 2 mm thick with no significant mass effect.  Right posterior scalp contusion without acute fracture.  Chronic microvascular disease with cerebral atrophy.  CT maxillofacial limited due to motion but without any evidence of acute  fracture.  CT cervical spine shows no evidence of acute fracture or traumatic malalignment there is similar severe multilevel degenerative changes present as well as some emphysema.  CT chest abdomen pelvis with contrast shows no evidence of any mediastinal or retroperitoneal hematoma.  There are some linear patchy infiltrates in the right middle lobe and right lower lobe with interval worsening in the right lower lobe suggesting atelectasis or pneumonia.  There is an interval appearance of a small right pleural effusion as well.  There is no demonstratable lacerations any solid organs or any ascites or pneumoperitoneum.  Fatty liver will need to be evaluated further as there are some low-density lesions in the liver suggesting cysts or hemangiomas or some other space-occupying neoplastic process possibly.  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  Unsure as to why the right and left humerus images were obtained as the patient does not have any focal tenderness, likely due to the amount of old abrasions and contusions visualized.  These images are both negative.  I do not think any other orthopedic imaging is needed at this time as the patient does not have any focal or point tenderness.   Considered suture  repair of the face, however this laceration has been going on for more than 36 hours and has a higher likelihood of infection.  Could possibly benefit from plastic consult.  Attending assessed at bedside and agrees.  On reevaluation, the patient is still well-appearing and has a unchanged neuro exam.  She still denies any pain other than some right-sided head pain where her abrasion is.  Since hyponatremia given the patient's hyponatremia, she was put on a 100 mL/h normal saline bolus.  2g of magnesium were given initially with another 2g given after speaking with the hospitalist.  Given the positive CT head readings, consult was put out to neurosurgery.  Consult return promptly and spoke to [place] who reviewed the patient's imaging and recommended repeat CT scan in 4 to 6 hours and repeat scan within 2 weeks outpatient. Repeat CT order placed.   Given patient's subdural hemorrhage, hyponatremia, hypomagnesia, in the setting of severe alcohol dependence, will admit patient for further management of these problems.  Spoke to Dr. Sidney Ace who will admit the patient.  He is aware of the repeat CT scan recommended by neurosurgery.  I discussed this case with my attending physician who cosigned this note including patient's presenting symptoms, physical exam, and planned diagnostics and interventions. Attending physician stated agreement with plan or made changes to plan which were implemented.   Attending physician assessed patient at bedside.  Final Clinical Impression(s) / ED Diagnoses Final diagnoses:  ETOH abuse  Hyponatremia  Hypomagnesemia  Traumatic subdural hemorrhage with unknown loss of consciousness status, initial encounter    Rx / DC Orders ED Discharge Orders     None         Sherrell Puller, Hershal Coria 04/15/21 2152    Lucrezia Starch, MD 04/16/21 2107

## 2021-04-15 NOTE — ED Triage Notes (Signed)
Per [facility] pt coming from UC for multiple falls and altered mental status. Patient is intoxicated. Care giver states pt is at her baseline. Pt covered in contusions and has laceration to right side of face from 2 days ago.

## 2021-04-16 DIAGNOSIS — J449 Chronic obstructive pulmonary disease, unspecified: Secondary | ICD-10-CM | POA: Diagnosis not present

## 2021-04-16 DIAGNOSIS — Z515 Encounter for palliative care: Secondary | ICD-10-CM | POA: Diagnosis not present

## 2021-04-16 DIAGNOSIS — W19XXXS Unspecified fall, sequela: Secondary | ICD-10-CM

## 2021-04-16 DIAGNOSIS — S065XAA Traumatic subdural hemorrhage with loss of consciousness status unknown, initial encounter: Secondary | ICD-10-CM | POA: Diagnosis not present

## 2021-04-16 DIAGNOSIS — F1010 Alcohol abuse, uncomplicated: Secondary | ICD-10-CM | POA: Diagnosis not present

## 2021-04-16 LAB — BASIC METABOLIC PANEL
Anion gap: 12 (ref 5–15)
BUN: 10 mg/dL (ref 8–23)
CO2: 27 mmol/L (ref 22–32)
Calcium: 8.1 mg/dL — ABNORMAL LOW (ref 8.9–10.3)
Chloride: 86 mmol/L — ABNORMAL LOW (ref 98–111)
Creatinine, Ser: 0.71 mg/dL (ref 0.44–1.00)
GFR, Estimated: >60 mL/min (ref >60)
Glucose, Bld: 86 mg/dL (ref 70–99)
Potassium: 3.6 mmol/L (ref 3.5–5.1)
Sodium: 125 mmol/L — ABNORMAL LOW (ref 135–145)

## 2021-04-16 LAB — CBC
HCT: 29.4 % — ABNORMAL LOW (ref 36.0–46.0)
Hemoglobin: 10.7 g/dL — ABNORMAL LOW (ref 12.0–15.0)
MCH: 36.6 pg — ABNORMAL HIGH (ref 26.0–34.0)
MCHC: 36.4 g/dL — ABNORMAL HIGH (ref 30.0–36.0)
MCV: 100.7 fL — ABNORMAL HIGH (ref 80.0–100.0)
Platelets: 263 10*3/uL (ref 150–400)
RBC: 2.92 MIL/uL — ABNORMAL LOW (ref 3.87–5.11)
RDW: 13.4 % (ref 11.5–15.5)
WBC: 5.7 10*3/uL (ref 4.0–10.5)
nRBC: 0 % (ref 0.0–0.2)

## 2021-04-16 LAB — PHOSPHORUS: Phosphorus: 3.6 mg/dL (ref 2.5–4.6)

## 2021-04-16 LAB — MAGNESIUM: Magnesium: 1.9 mg/dL (ref 1.7–2.4)

## 2021-04-16 MED ORDER — ADULT MULTIVITAMIN W/MINERALS CH
1.0000 | ORAL_TABLET | Freq: Every day | ORAL | Status: DC
  Administered 2021-04-17 – 2021-04-27 (×11): 1 via ORAL
  Filled 2021-04-16 (×12): qty 1

## 2021-04-16 MED ORDER — THIAMINE HCL 100 MG PO TABS
100.0000 mg | ORAL_TABLET | Freq: Every day | ORAL | Status: DC
  Administered 2021-04-17 – 2021-04-20 (×4): 100 mg via ORAL
  Filled 2021-04-16 (×5): qty 1

## 2021-04-16 MED ORDER — SODIUM CHLORIDE 0.9% FLUSH: 10.0000 mL | INTRAVENOUS | Status: DC | PRN

## 2021-04-16 MED ORDER — FOLIC ACID 1 MG PO TABS
1.0000 mg | ORAL_TABLET | Freq: Every day | ORAL | Status: DC
  Administered 2021-04-17 – 2021-04-27 (×11): 1 mg via ORAL
  Filled 2021-04-16 (×12): qty 1

## 2021-04-16 MED ORDER — THIAMINE HCL 100 MG/ML IJ SOLN: 100.0000 mg | Freq: Every day | INTRAMUSCULAR | Status: DC

## 2021-04-16 MED ORDER — CEFAZOLIN SODIUM-DEXTROSE 1-4 GM/50ML-% IV SOLN
1.0000 g | Freq: Three times a day (TID) | INTRAVENOUS | Status: DC
  Administered 2021-04-16 – 2021-04-20 (×12): 1 g via INTRAVENOUS
  Filled 2021-04-16 (×13): qty 50

## 2021-04-16 MED ORDER — SODIUM CHLORIDE 0.9% FLUSH
10.0000 mL | Freq: Two times a day (BID) | INTRAVENOUS | Status: DC
  Administered 2021-04-16 – 2021-04-27 (×19): 10 mL

## 2021-04-16 MED ORDER — LORAZEPAM 1 MG PO TABS
1.0000 mg | ORAL_TABLET | ORAL | Status: AC | PRN
  Administered 2021-04-17 – 2021-04-18 (×3): 1 mg via ORAL
  Administered 2021-04-18 – 2021-04-20 (×3): 2 mg via ORAL
  Administered 2021-04-20: 1 mg via ORAL
  Administered 2021-04-21: 2 mg via ORAL
  Administered 2021-04-21: 1 mg via ORAL
  Administered 2021-04-21: 3 mg via ORAL
  Administered 2021-04-21: 2 mg via ORAL
  Administered 2021-04-21: 1 mg via ORAL
  Administered 2021-04-21: 2 mg via ORAL
  Administered 2021-04-22: 3 mg via ORAL
  Filled 2021-04-16: qty 2
  Filled 2021-04-16: qty 1
  Filled 2021-04-16: qty 2
  Filled 2021-04-16 (×3): qty 1
  Filled 2021-04-16: qty 3
  Filled 2021-04-16 (×4): qty 2
  Filled 2021-04-16: qty 1
  Filled 2021-04-16: qty 3
  Filled 2021-04-16: qty 1

## 2021-04-16 MED ORDER — LORAZEPAM 2 MG/ML IJ SOLN
1.0000 mg | INTRAMUSCULAR | Status: AC | PRN
  Administered 2021-04-16: 3 mg via INTRAVENOUS
  Administered 2021-04-18 (×3): 2 mg via INTRAVENOUS
  Filled 2021-04-16 (×2): qty 1
  Filled 2021-04-16: qty 2
  Filled 2021-04-16: qty 1

## 2021-04-16 NOTE — Progress Notes (Signed)
PIV consult: Pt with limited vascular access options. RUE restricted. LUE with bruising and infiltrations, has had multiple failed PIVs. Midline placed in L upper arm.. Please consider central line if this line fails.

## 2021-04-16 NOTE — Progress Notes (Signed)
Unable to administer medication when CIWA done, due to IV infiltration.  Administered medication per [facility] when IV access re-established.   04/16/21 0129  CIWA-Ar  BP 110/82  Pulse Rate 83  Nausea and Vomiting 0  Tactile Disturbances 0  Tremor 2  Auditory Disturbances 1  Paroxysmal Sweats 0  Visual Disturbances 4  Anxiety 3  Headache, Fullness in Head 0  Agitation 4  Orientation and Clouding of Sensorium 4 (no iv access at this time)  CIWA-Ar Total 18

## 2021-04-16 NOTE — TOC Initial Note (Signed)
Transition of Care [facility]) - Initial/Assessment Note    Patient Details  Name: Mindy Young MRN: [id number] Date of Birth: 1945-07-03  [facility] CM/SW Contact:    Pollie Friar, RN Phone Number: 04/16/2021, 3:38 PM  Clinical Narrative:                 CM met with the patient and her caregiver at the bedside. Caregiver is usually with the patient in the daytime and she is alone at night. Caregiver has been staying around the clock this last week as pt has been weaker.  Caregiver states the patient will drive herself to the store to buy alcohol. The patients PCP has told them she should not be driving and should stop drinking but caregiver has been afraid to stop her alcohol for risk of withdrawls.  Pts POA is her daughter. TOC will need to reach out to the daughter once therapies have evaluated to determine d/c disposition.   Expected Discharge Plan: Skilled Nursing Facility Barriers to Discharge: Continued Medical Work up   Patient Goals and CMS Choice        Expected Discharge Plan and Services Expected Discharge Plan: [place] In-house Referral: Clinical Social Work Discharge Planning Services: CM Consult   Living arrangements for the past 2 months: [place]                                      Prior Living Arrangements/Services Living arrangements for the past 2 months: Single Family Home Lives with:: Self Patient language and need for interpreter reviewed:: Yes          Care giver support system in place?: Yes (comment) Current home services: DME (cane) Criminal Activity/Legal Involvement Pertinent to Current Situation/Hospitalization: No - Comment as needed  Activities of Daily Living      Permission Sought/Granted                  Emotional Assessment Appearance:: Disheveled       Alcohol / Substance Use: Alcohol Use Psych Involvement: No (comment)  Admission diagnosis:  Subdural hematoma  [S06.5XAA] Fall [W19.XXXA] Difficult intravenous access [Z78.9] Patient Active Problem List   Diagnosis Date Noted   Subdural hematoma 04/15/2021   Hypomagnesemia 02/20/2021   Elevated troponin 02/20/2021   COPD (chronic obstructive pulmonary disease) ([place]) 02/20/2021   Diarrhea 02/20/2021   Prolonged QT interval 02/20/2021   Lung neoplasm 02/20/2021   Alcohol use disorder, severe, in controlled environment ([place]) 12/29/2020   Alcohol use disorder, severe, dependence ([place]) 12/29/2020   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 ([place]) 12/29/2020   Alcohol dependence with uncomplicated intoxication ([place])    Clavicle fracture 10/29/2020   Rib fracture 10/29/2020   Hypokalemia 06/14/2020   Seizure ([place]) 06/13/2020   Impingement syndrome of left shoulder 03/27/2019   Pain in left shoulder 02/21/2019   Rib pain on right side 02/21/2019   Low back pain 02/07/2019   GERD (gastroesophageal reflux disease) 12/09/2018   Anxiety 12/09/2018   Difficulty speaking 12/09/2018   Leukocytosis 12/09/2018   Seizure-like activity ([place]) 12/09/2018   Pelvic fracture ([place]) 11/25/2018   Pubic bone fracture ([place]) 11/24/2018   Left knee pain 03/29/2018   Unilateral primary osteoarthritis, left knee 03/29/2018   Genetic testing 01/24/2018   Family history of breast cancer    Family history of prostate cancer    Malignant  neoplasm of lower-inner quadrant of right breast of female, estrogen receptor positive ([place]) 10/24/2017   Anemia 06/29/2011   Hypertension    Hyponatremia 06/25/2011   Transaminitis 06/25/2011   Alcohol abuse 06/25/2011   Cough 06/25/2011   Cigarette smoker 06/25/2011   PCP:  Loura Pardon, MD Pharmacy:   [facility] [place], [place] [address] [place] [address] Phone: [phone] Fax: [phone]     Social Determinants of Health (SDOH) Interventions    Readmission Risk  Interventions Readmission Risk Prevention Plan 02/23/2021  Transportation Screening Complete  Medication Review Press photographer) Complete  PCP or Specialist appointment within 3-5 days of discharge Complete  HRI or [place] Complete  SW Recovery Care/Counseling Consult Complete  [place] Patient Refused  Some recent data might be hidden

## 2021-04-16 NOTE — Progress Notes (Signed)
Pharmacy Antibiotic Note  Mindy Young is a 76 y.o. female admitted on 04/15/2021 with cellulitis.  Pharmacy has been consulted for cefazolin dosing.  Pt was admitted after a fall due to ETOH and resulting in facial lacerations. Cefazolin was ordered.   Scr<1, CrCl>50 ml/min  Plan: Cefazolin 1g IV q8  Height: 5\' 2"  (157.5 cm) Weight: 61.2 kg (135 lb) IBW/kg (Calculated) : 50.1  Temp (24hrs), Avg:98 F (36.7 C), Min:97.7 F (36.5 C), Max:98.4 F (36.9 C)  Recent Labs  Lab 04/15/21 1458 04/16/21 0344  WBC 7.0 5.7  CREATININE 0.76 0.71    Estimated Creatinine Clearance: 52.3 mL/min (by C-G formula based on SCr of 0.71 mg/dL).    Allergies  Allergen Reactions   Percocet [Oxycodone-Acetaminophen] Other (See Comments)    "bugs crawling on me"    Antimicrobials this admission: 2/24 cefazolin>>  Dose adjustments this admission:   Microbiology results:   Onnie Boer, PharmD, BCIDP, AAHIVP, CPP Infectious Disease Pharmacist 04/16/2021 3:41 PM

## 2021-04-16 NOTE — Consult Note (Signed)
Reason for Consult:facial laceration Referring Physician: hospitalist  Mindy Young is an 76 y.o. female.  HPI: hx of fall multiple times and previously had a right facial laceration. She present this visit for fall to head with subdural. She apparently has had the facial laceration for 3-4 days days.   Past Medical History:  Diagnosis Date   Alcohol abuse    Allergy    Anemia 06/29/2011   Anxiety    Arthritis    Breast cancer ([place])    right breast   C1 cervical fracture ([place]) 02/1997   Cancer ([place])    melanoma   Depression    Family history of breast cancer    Family history of prostate cancer    GERD (gastroesophageal reflux disease)    Hypertension    Tardive dyskinesia     Past Surgical History:  Procedure Laterality Date   ABDOMINAL HYSTERECTOMY     ASPIRATION OF ABSCESS Right 11/20/2017   Procedure: ASPIRATION OF RIGHT AXILLARY SEROMA;  Surgeon: Rolm Bookbinder, MD;  Location: [facility];  Service: General;  Laterality: Right;   AUGMENTATION MAMMAPLASTY Bilateral    biateral implants , approx 2015   BREAST LUMPECTOMY Right 11/02/2017   re-ex 11-20-17   BREAST LUMPECTOMY WITH RADIOACTIVE SEED AND SENTINEL LYMPH NODE BIOPSY Right 11/02/2017   Procedure: BREAST LUMPECTOMY WITH RADIOACTIVE SEED AND SENTINEL LYMPH NODE BIOPSY;  Surgeon: Rolm Bookbinder, MD;  Location: [facility];  Service: General;  Laterality: Right;   BRONCHIAL WASHINGS  02/21/2021   Procedure: BRONCHIAL WASHINGS;  Surgeon: Candee Furbish, MD;  Location: [facility] ENDOSCOPY;  Service: Pulmonary;;   CHOLECYSTECTOMY     collarbone     INCONTINENCE SURGERY     KNEE SURGERY     removal of cyst, repair of cartiledge   LAPAROSCOPY     for endometriosis   RE-EXCISION OF BREAST LUMPECTOMY Right 11/20/2017   Procedure: RE-EXCISION OF RIGHT BREAST MARGINS;  Surgeon: Rolm Bookbinder, MD;  Location: [facility];  Service: General;  Laterality: Right;   ROTATOR CUFF REPAIR      left   VIDEO BRONCHOSCOPY Right 02/21/2021   Procedure: VIDEO BRONCHOSCOPY WITHOUT FLUORO;  Surgeon: Candee Furbish, MD;  Location: [facility] ENDOSCOPY;  Service: Pulmonary;  Laterality: Right;    Family History  Problem Relation Age of Onset   COPD Mother    Prostate cancer Father 74       seed implant for treatment   Colon polyps Father        'a few'   Breast cancer Sister 13   Breast cancer Maternal Aunt        dx >50   Breast cancer Other 52       bilateral   Colon cancer Neg Hx     Social History:  reports that she has been smoking cigarettes. She has been smoking an average of 1 pack per day. She has never used smokeless tobacco. She reports current alcohol use of about 21.0 standard drinks per week. She reports that she does not use drugs.  Allergies:  Allergies  Allergen Reactions   Percocet [Oxycodone-Acetaminophen] Other (See Comments)    "bugs crawling on me"    Medications: I have reviewed the patient's current medications.  Results for orders placed or performed during the hospital encounter of 04/15/21 (from the past 48 hour(s))  Comprehensive metabolic panel     Status: Abnormal   Collection Time: 04/15/21  2:58 PM  Result Value  Ref Range   Sodium 120 (L) 135 - 145 mmol/L   Potassium 3.6 3.5 - 5.1 mmol/L   Chloride 80 (L) 98 - 111 mmol/L   CO2 26 22 - 32 mmol/L   Glucose, Bld 60 (L) 70 - 99 mg/dL    Comment: Glucose reference range applies only to samples taken after fasting for at least 8 hours.   BUN 8 8 - 23 mg/dL   Creatinine, Ser 0.76 0.44 - 1.00 mg/dL   Calcium 8.0 (L) 8.9 - 10.3 mg/dL   Total Protein 5.1 (L) 6.5 - 8.1 g/dL   Albumin 3.0 (L) 3.5 - 5.0 g/dL   AST 48 (H) 15 - 41 U/L   ALT 40 0 - 44 U/L   Alkaline Phosphatase 75 38 - 126 U/L   Total Bilirubin 0.6 0.3 - 1.2 mg/dL   GFR, Estimated >60 >60 mL/min    Comment: (NOTE) Calculated using the CKD-EPI Creatinine Equation (2021)    Anion gap 14 5 - 15    Comment: Performed at [facility], [place] [address], [place], [place] [address]  CBC with Differential     Status: Abnormal   Collection Time: 04/15/21  2:58 PM  Result Value Ref Range   WBC 7.0 4.0 - 10.5 K/uL   RBC 2.87 (L) 3.87 - 5.11 MIL/uL   Hemoglobin 10.1 (L) 12.0 - 15.0 g/dL   HCT 29.7 (L) 36.0 - 46.0 %   MCV 103.5 (H) 80.0 - 100.0 fL   MCH 35.2 (H) 26.0 - 34.0 pg   MCHC 34.0 30.0 - 36.0 g/dL   RDW 13.5 11.5 - 15.5 %   Platelets 291 150 - 400 K/uL   nRBC 0.0 0.0 - 0.2 %   Neutrophils Relative % 72 %   Neutro Abs 5.1 1.7 - 7.7 K/uL   Lymphocytes Relative 12 %   Lymphs Abs 0.8 0.7 - 4.0 K/uL   Monocytes Relative 14 %   Monocytes Absolute 1.0 0.1 - 1.0 K/uL   Eosinophils Relative 0 %   Eosinophils Absolute 0.0 0.0 - 0.5 K/uL   Basophils Relative 1 %   Basophils Absolute 0.0 0.0 - 0.1 K/uL   Immature Granulocytes 1 %   Abs Immature Granulocytes 0.09 (H) 0.00 - 0.07 K/uL    Comment: Performed at [facility] [address] [place], [place] [address]  Ethanol     Status: Abnormal   Collection Time: 04/15/21  2:58 PM  Result Value Ref Range   Alcohol, Ethyl (B) 62 (H) <10 mg/dL    Comment: (NOTE) Lowest detectable limit for serum alcohol is 10 mg/dL.  For medical purposes only. Performed at [facility], [place] [address] [place], [place] [address]   Magnesium     Status: Abnormal   Collection Time: 04/15/21  2:58 PM  Result Value Ref Range   Magnesium 1.0 (L) 1.7 - 2.4 mg/dL    Comment: Performed at [facility] [address], [place], [place] [address]  Resp Panel by RT-PCR (Flu A&B, Covid) Nasopharyngeal Swab     Status: None   Collection Time: 04/15/21  7:35 PM   Specimen: Nasopharyngeal Swab; Nasopharyngeal(NP) swabs in vial transport medium  Result Value Ref Range   SARS Coronavirus 2 by RT PCR NEGATIVE NEGATIVE    Comment: (NOTE) SARS-CoV-2 target nucleic acids are NOT DETECTED.  The SARS-CoV-2 RNA is generally detectable in upper respiratory specimens during the  acute phase of infection. The  lowest concentration of SARS-CoV-2 viral copies this assay can detect is 138 copies/mL. A negative result does not preclude SARS-Cov-2 infection and should not be used as the sole basis for treatment or other patient management decisions. A negative result may occur with  improper specimen collection/handling, submission of specimen other than nasopharyngeal swab, presence of viral mutation(s) within the areas targeted by this assay, and inadequate number of viral copies(<138 copies/mL). A negative result must be combined with clinical observations, patient history, and epidemiological information. The expected result is Negative.  Fact Sheet for Patients:  EntrepreneurPulse.com.au  Fact Sheet for Healthcare Providers:  IncredibleEmployment.be  This test is no t yet approved or cleared by the [place] FDA and  has been authorized for detection and/or diagnosis of SARS-CoV-2 by FDA under an Emergency Use Authorization (EUA). This EUA will remain  in effect (meaning this test can be used) for the duration of the COVID-19 declaration under Section 564(b)(1) of the Act, 21 U.S.C.section 360bbb-3(b)(1), unless the authorization is terminated  or revoked sooner.       Influenza A by PCR NEGATIVE NEGATIVE   Influenza B by PCR NEGATIVE NEGATIVE    Comment: (NOTE) The Xpert Xpress SARS-CoV-2/FLU/RSV plus assay is intended as an aid in the diagnosis of influenza from Nasopharyngeal swab specimens and should not be used as a sole basis for treatment. Nasal washings and aspirates are unacceptable for Xpert Xpress SARS-CoV-2/FLU/RSV testing.  Fact Sheet for Patients: EntrepreneurPulse.com.au  Fact Sheet for Healthcare Providers: IncredibleEmployment.be  This test is not yet approved or cleared by the [place] FDA and has been authorized for detection and/or diagnosis of  SARS-CoV-2 by FDA under an Emergency Use Authorization (EUA). This EUA will remain in effect (meaning this test can be used) for the duration of the COVID-19 declaration under Section 564(b)(1) of the Act, 21 U.S.C. section 360bbb-3(b)(1), unless the authorization is terminated or revoked.  Performed at [facility], [place] [address] [place], [place] [address]   POC CBG, ED     Status: None   Collection Time: 04/15/21  8:14 PM  Result Value Ref Range   Glucose-Capillary 80 70 - 99 mg/dL    Comment: Glucose reference range applies only to samples taken after fasting for at least 8 hours.  Basic metabolic panel     Status: Abnormal   Collection Time: 04/16/21  3:44 AM  Result Value Ref Range   Sodium 125 (L) 135 - 145 mmol/L   Potassium 3.6 3.5 - 5.1 mmol/L   Chloride 86 (L) 98 - 111 mmol/L   CO2 27 22 - 32 mmol/L   Glucose, Bld 86 70 - 99 mg/dL    Comment: Glucose reference range applies only to samples taken after fasting for at least 8 hours.   BUN 10 8 - 23 mg/dL   Creatinine, Ser 0.71 0.44 - 1.00 mg/dL   Calcium 8.1 (L) 8.9 - 10.3 mg/dL   GFR, Estimated >60 >60 mL/min    Comment: (NOTE) Calculated using the CKD-EPI Creatinine Equation (2021)    Anion gap 12 5 - 15    Comment: Performed at [facility] [address], [place] [address]  CBC     Status: Abnormal   Collection Time: 04/16/21  3:44 AM  Result Value Ref Range   WBC 5.7 4.0 - 10.5 K/uL   RBC 2.92 (L) 3.87 - 5.11 MIL/uL   Hemoglobin 10.7 (L) 12.0 - 15.0 g/dL   HCT 29.4 (L)  36.0 - 46.0 %   MCV 100.7 (H) 80.0 - 100.0 fL   MCH 36.6 (H) 26.0 - 34.0 pg   MCHC 36.4 (H) 30.0 - 36.0 g/dL   RDW 13.4 11.5 - 15.5 %   Platelets 263 150 - 400 K/uL   nRBC 0.0 0.0 - 0.2 %    Comment: Performed at [facility] [address], [place], [place] [address]  Magnesium     Status: None   Collection Time: 04/16/21  3:44 AM  Result Value Ref Range   Magnesium 1.9 1.7 - 2.4 mg/dL    Comment: Performed  at [facility] [address], [place], [place] [address]  Phosphorus     Status: None   Collection Time: 04/16/21  3:44 AM  Result Value Ref Range   Phosphorus 3.6 2.5 - 4.6 mg/dL    Comment: Performed at [facility] [address], [place], [place] [address]    CT Head Wo Contrast  Result Date: 04/15/2021 CLINICAL DATA:  Follow-up subdural hematoma. EXAM: CT HEAD WITHOUT CONTRAST TECHNIQUE: Contiguous axial images were obtained from the base of the skull through the vertex without intravenous contrast. RADIATION DOSE REDUCTION: This exam was performed according to the departmental dose-optimization program which includes automated exposure control, adjustment of the mA and/or kV according to patient size and/or use of iterative reconstruction technique. COMPARISON:  5 hours prior FINDINGS: Brain: Unchanged acute subdural hemorrhage layering along the left tentorium, measuring up to 3 mm in thickness, series 5, image 43. There is slightly decreasing density suggesting some interval dispersion. This thin, 2 mm, subdural hemorrhage along the right frontotemporal convexity is not significantly changed, for example series 5, image 31. there may be a thin, 2-3 mm a isodense subdural collection overlying the left frontoparietal convexity, series 5, image 44 and series 3, image 21. There is no significant mass effect or midline shift from any of these subdural hemorrhages. Stable degree of atrophy and chronic small vessel ischemia. No acute infarct. Vascular: Skull base atherosclerosis.  No hyperdense vessel Skull: No skull fracture. Sinuses/Orbits: Assessed earlier on face CT. Other: Right parietal scalp hematoma. IMPRESSION: 1. Unchanged acute subdural hemorrhage layering along the left tentorium, measuring up to 3 mm in thickness. There is slightly decreasing density suggesting some interval dispersion. 2. Unchanged thin, 2 mm, subdural hemorrhage along the right frontotemporal convexity. 3.  Possible thin, 2-3 mm isodense subdural collection overlying the left frontoparietal convexity, not seen previously. 4. No significant mass effect or midline shift from any of these subdural hemorrhages. 5. Stable atrophy and chronic small vessel ischemia. Electronically Signed   By: Keith Rake M.D.   On: 04/15/2021 21:10   CT Head Wo Contrast  Result Date: 04/15/2021 CLINICAL DATA:  Head trauma, minor (Age >= 65y); Neck trauma (Age >= 65y); Facial trauma, blunt EXAM: CT HEAD WITHOUT CONTRAST CT MAXILLOFACIAL WITHOUT CONTRAST CT CERVICAL SPINE WITHOUT CONTRAST TECHNIQUE: Multidetector CT imaging of the head, cervical spine, and maxillofacial structures were performed using the standard protocol without intravenous contrast. Multiplanar CT image reconstructions of the cervical spine and maxillofacial structures were also generated. RADIATION DOSE REDUCTION: This exam was performed according to the departmental dose-optimization program which includes automated exposure control, adjustment of the mA and/or kV according to patient size and/or use of iterative reconstruction technique. COMPARISON:  April 15, 2021 FINDINGS: CT HEAD FINDINGS Brain: Acute subdural hemorrhage layering along the left tentorial leaflet, measuring up to 3 mm in thickness. Thin (  2 mm thick) acute subdural hemorrhage along the right frontotemporal convexity. No evidence of acute large vascular territory infarct. Moderate patchy and confluent white matter hypoattenuation, nonspecific but compatible with chronic microvascular ischemic disease. No midline shift, mass lesion, or hydrocephalus. Cerebral atrophy with ex vacuo ventricular dilation. Vascular: Calcific atherosclerosis. Skull: Right posterior scalp contusion without acute fracture. Other: No mastoid effusions. CT MAXILLOFACIAL FINDINGS Mildly motion limited. Osseous: No fracture or mandibular dislocation. No destructive process. Orbits: Negative. No traumatic or inflammatory  finding. Sinuses: Mucosal thickening of the inferior right maxillary sinus. Fluid layering in a small left sphenoid sinus. Otherwise, clear sinuses. Soft tissues: Negative. CT CERVICAL SPINE FINDINGS Alignment: Similar alignment, including mild anterolisthesis of C3 on C4, C4 on C5 and C7 on T1. broad levocurvature. Skull base and vertebrae: No evidence of acute fracture. Stable bony fusion across the C4-C5 disc space with similar anterior height loss and focal kyphosis at this level. No new vertebral body height loss. Soft tissues and spinal canal: No prevertebral fluid or swelling. No visible canal hematoma. Disc levels: Stable severe degenerative disc disease at C5-C6 and C6-C7. Similar severe multilevel facet arthropathy and severe craniocervical degenerative change. Upper chest: Emphysema with similar scarring in the lung apices. 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 IMPRESSION: CT head: 1. Acute subdural hemorrhage layering along the left tentorial leaflet (3 mm thick) and along the right cerebral convexity (2 mm thick). No significant mass effect. 2. Right posterior scalp contusion without acute fracture. 3. 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 CT maxillofacial: Motion limited without evidence of acute fracture. CT cervical spine: 1. No evidence of acute fracture or traumatic malalignment. 2. Similar severe multilevel degenerative change. 3.  Emphysema (ICD10-J43.9). Findings discussed with provider Norval Gable, [place] via telephone at 4:52 PM. Electronically Signed   By: Margaretha Sheffield M.D.   On: 04/15/2021 16:54   CT Cervical Spine Wo Contrast  Result Date: 04/15/2021 CLINICAL DATA:  Head trauma, minor (Age >= 65y); Neck trauma (Age >= 65y); Facial trauma, blunt EXAM: CT HEAD WITHOUT CONTRAST CT MAXILLOFACIAL WITHOUT CONTRAST CT CERVICAL SPINE WITHOUT CONTRAST TECHNIQUE: Multidetector CT imaging of the head, cervical spine, and maxillofacial structures were performed  using the standard protocol without intravenous contrast. Multiplanar CT image reconstructions of the cervical spine and maxillofacial structures were also generated. RADIATION DOSE REDUCTION: This exam was performed according to the departmental dose-optimization program which includes automated exposure control, adjustment of the mA and/or kV according to patient size and/or use of iterative reconstruction technique. COMPARISON:  April 15, 2021 FINDINGS: CT HEAD FINDINGS Brain: Acute subdural hemorrhage layering along the left tentorial leaflet, measuring up to 3 mm in thickness. Thin (2 mm thick) acute subdural hemorrhage along the right frontotemporal convexity. No evidence of acute large vascular territory infarct. Moderate patchy and confluent white matter hypoattenuation, nonspecific but compatible with chronic microvascular ischemic disease. No midline shift, mass lesion, or hydrocephalus. Cerebral atrophy with ex vacuo ventricular dilation. Vascular: Calcific atherosclerosis. Skull: Right posterior scalp contusion without acute fracture. Other: No mastoid effusions. CT MAXILLOFACIAL FINDINGS Mildly motion limited. Osseous: No fracture or mandibular dislocation. No destructive process. Orbits: Negative. No traumatic or inflammatory finding. Sinuses: Mucosal thickening of the inferior right maxillary sinus. Fluid layering in a small left sphenoid sinus. Otherwise, clear sinuses. Soft tissues: Negative. CT CERVICAL SPINE FINDINGS Alignment: Similar alignment, including mild anterolisthesis of C3 on C4, C4 on C5 and C7 on T1. broad levocurvature. Skull base and vertebrae: No evidence of acute fracture. Stable bony  fusion across the C4-C5 disc space with similar anterior height loss and focal kyphosis at this level. No new vertebral body height loss. Soft tissues and spinal canal: No prevertebral fluid or swelling. No visible canal hematoma. Disc levels: Stable severe degenerative disc disease at C5-C6 and  C6-C7. Similar severe multilevel facet arthropathy and severe craniocervical degenerative change. Upper chest: Emphysema with similar scarring in the lung apices. 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 IMPRESSION: CT head: 1. Acute subdural hemorrhage layering along the left tentorial leaflet (3 mm thick) and along the right cerebral convexity (2 mm thick). No significant mass effect. 2. Right posterior scalp contusion without acute fracture. 3. 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 CT maxillofacial: Motion limited without evidence of acute fracture. CT cervical spine: 1. No evidence of acute fracture or traumatic malalignment. 2. Similar severe multilevel degenerative change. 3.  Emphysema (ICD10-J43.9). Findings discussed with provider Norval Gable, [place] via telephone at 4:52 PM. Electronically Signed   By: Margaretha Sheffield M.D.   On: 04/15/2021 16:54   CT CHEST ABDOMEN PELVIS W CONTRAST  Result Date: 04/15/2021 CLINICAL DATA:  Trauma, fall EXAM: CT CHEST, ABDOMEN, AND PELVIS WITH CONTRAST TECHNIQUE: Multidetector CT imaging of the chest, abdomen and pelvis was performed following the standard protocol during bolus administration of intravenous contrast. RADIATION DOSE REDUCTION: This exam was performed according to the departmental dose-optimization program which includes automated exposure control, adjustment of the mA and/or kV according to patient size and/or use of iterative reconstruction technique. CONTRAST:  131mL OMNIPAQUE IOHEXOL 300 MG/ML  SOLN COMPARISON:  04/07/2021 FINDINGS: CT CHEST FINDINGS Cardiovascular: There is homogeneous enhancement in thoracic aorta. There are no filling defects in central pulmonary artery branches. Mediastinum/Nodes: There is no evidence of mediastinal hematoma. There is no significant lymphadenopathy. Lungs/Pleura: Severe centrilobular and panlobular emphysema is seen. There are patchy infiltrates in the right middle lobe and right lower  lobe with interval worsening of infiltrate in the right lower lobe. Small right pleural effusion is seen. Linear densities seen in both apices, more so on the right side with no significant interval change. There is no pneumothorax. Musculoskeletal: No displaced fractures are seen. CT ABDOMEN PELVIS FINDINGS Hepatobiliary: Liver measures 14.7 cm. There is fatty infiltration. There are few low-density lesions in the liver largest in the left lobe measuring approximately 1.4 cm. Gallbladder is not seen. There is no significant dilation of bile ducts. Pancreas: No focal abnormality is seen. Spleen: Unremarkable. Adrenals/Urinary Tract: Adrenals are unremarkable. There is no demonstrable cortical laceration. There is no hydronephrosis. Ureters are not dilated. Urinary bladder is unremarkable. Stomach/Bowel: Stomach is unremarkable. Small bowel loops are not dilated. Appendix is not distinctly seen. There is no pericecal inflammation. There is no significant wall thickening in colon. There is no pericolic stranding. Vascular/Lymphatic: There are scattered atherosclerotic plaques and calcifications in the aorta and its major branches. Reproductive: Uterus is not seen. Other: There is no ascites or pneumoperitoneum. Small umbilical hernia containing fat is seen. Possible small right inguinal hernia containing fat is seen. Augmentation/reconstruction prostheses are seen in both breasts. Musculoskeletal: No displaced fractures are seen. Degenerative changes are noted in the lower lumbar spine with encroachment of neural foramina. There is first-degree spondylolisthesis and spinal stenosis at L4-L5 level. IMPRESSION: There is no evidence of mediastinal or retroperitoneal hematoma. There are linear patchy infiltrates in the right middle lobe and right lower lobe with interval worsening in the right lower lobe suggesting atelectasis/pneumonia. There is interval appearance of small right pleural effusion. There  is no  demonstrable laceration in solid organs. There is no ascites or pneumoperitoneum. Fatty liver. There are low-density lesions in the liver suggesting cysts or hemangiomas or some other space-occupying neoplastic process. Severe centrilobular and panlobular emphysema. Lumbar spondylosis with spinal stenosis at L4-L5 level. Other findings as described in the body of the report. Electronically Signed   By: Elmer Picker M.D.   On: 04/15/2021 16:50   DG Humerus Left  Result Date: 04/15/2021 CLINICAL DATA:  Fall EXAM: LEFT HUMERUS - 2+ VIEW COMPARISON:  None. FINDINGS: There is no evidence of fracture or other focal bone lesions. Soft tissues are unremarkable. IMPRESSION: Negative. Electronically Signed   By: Franchot Gallo M.D.   On: 04/15/2021 15:44   DG Humerus Right  Result Date: 04/15/2021 CLINICAL DATA:  Fall EXAM: RIGHT HUMERUS - 2+ VIEW COMPARISON:  None. FINDINGS: There is no evidence of fracture or other focal bone lesions. Soft tissues are unremarkable. IMPRESSION: Negative. Electronically Signed   By: Franchot Gallo M.D.   On: 04/15/2021 15:43   CT US GUIDE VASC ACCESS LT NO REPORT  Result Date: 04/15/2021 There is no Radiologist interpretation  for this exam.  CT Maxillofacial WO CM  Result Date: 04/15/2021 CLINICAL DATA:  Head trauma, minor (Age >= 65y); Neck trauma (Age >= 65y); Facial trauma, blunt EXAM: CT HEAD WITHOUT CONTRAST CT MAXILLOFACIAL WITHOUT CONTRAST CT CERVICAL SPINE WITHOUT CONTRAST TECHNIQUE: Multidetector CT imaging of the head, cervical spine, and maxillofacial structures were performed using the standard protocol without intravenous contrast. Multiplanar CT image reconstructions of the cervical spine and maxillofacial structures were also generated. RADIATION DOSE REDUCTION: This exam was performed according to the departmental dose-optimization program which includes automated exposure control, adjustment of the mA and/or kV according to patient size and/or use of  iterative reconstruction technique. COMPARISON:  April 15, 2021 FINDINGS: CT HEAD FINDINGS Brain: Acute subdural hemorrhage layering along the left tentorial leaflet, measuring up to 3 mm in thickness. Thin (2 mm thick) acute subdural hemorrhage along the right frontotemporal convexity. No evidence of acute large vascular territory infarct. Moderate patchy and confluent white matter hypoattenuation, nonspecific but compatible with chronic microvascular ischemic disease. No midline shift, mass lesion, or hydrocephalus. Cerebral atrophy with ex vacuo ventricular dilation. Vascular: Calcific atherosclerosis. Skull: Right posterior scalp contusion without acute fracture. Other: No mastoid effusions. CT MAXILLOFACIAL FINDINGS Mildly motion limited. Osseous: No fracture or mandibular dislocation. No destructive process. Orbits: Negative. No traumatic or inflammatory finding. Sinuses: Mucosal thickening of the inferior right maxillary sinus. Fluid layering in a small left sphenoid sinus. Otherwise, clear sinuses. Soft tissues: Negative. CT CERVICAL SPINE FINDINGS Alignment: Similar alignment, including mild anterolisthesis of C3 on C4, C4 on C5 and C7 on T1. broad levocurvature. Skull base and vertebrae: No evidence of acute fracture. Stable bony fusion across the C4-C5 disc space with similar anterior height loss and focal kyphosis at this level. No new vertebral body height loss. Soft tissues and spinal canal: No prevertebral fluid or swelling. No visible canal hematoma. Disc levels: Stable severe degenerative disc disease at C5-C6 and C6-C7. Similar severe multilevel facet arthropathy and severe craniocervical degenerative change. Upper chest: Emphysema with similar scarring in the lung apices. 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 IMPRESSION: CT head: 1. Acute subdural hemorrhage layering along the left tentorial leaflet (3 mm thick) and along the right cerebral convexity (2 mm thick). No significant mass  effect. 2. Right posterior scalp contusion without acute fracture. 3. 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 CT maxillofacial: Motion limited without evidence of acute fracture. CT cervical spine: 1. No evidence of acute fracture or traumatic malalignment. 2. Similar severe multilevel degenerative change. 3.  Emphysema (ICD10-J43.9). Findings discussed with provider Norval Gable, [place] via telephone at 4:52 PM. Electronically Signed   By: Margaretha Sheffield M.D.   On: 04/15/2021 16:54    ROS Blood pressure (!) 141/82, pulse 68, temperature 97.9 F (36.6 C), temperature source Axillary, resp. rate 18, height 5\' 2"  (1.575 m), weight 61.2 kg, SpO2 100 %. Physical Exam HENT:     Head:     Comments: Right face along the lateral brow extending 3 cm is a healing wound the has escar and exudate. The wound clearly is many days old. There is no surrounding erythema. No injury to the eye. There is extensive old ecchymosis to face.     Mouth/Throat:     Mouth: Mucous membranes are moist.  Eyes:     Extraocular Movements: Extraocular movements intact.     Pupils: Pupils are equal, round, and reactive to light.  Musculoskeletal:     Cervical back: Normal range of motion.  Neurological:     Mental Status: She is alert.      Assessment/Plan: Right old facial laceration- the wound has a escar and exudate already formed in the base. This needs to be cleaned with hydrogen peroxide once per day and saline gauze to the wound twice per day. It will need to heal completely then can be revised if needed. It may help to give her some keflex or ancef for wound coverage.   Melissa Montane 04/16/2021, 2:19 PM

## 2021-04-16 NOTE — Progress Notes (Signed)
PROGRESS NOTE    Mindy Young  VPX:[id number] DOB: [birth year] DOA: 04/15/2021 PCP: Loura Pardon, MD   Chief Complaint  Patient presents with   Alcohol Intoxication   Fall    Brief Narrative:   Mindy Young is a 76 y.o. Caucasian female with medical history significant for alcohol abuse, anxiety, depression, GERD, seizure disorder and previous SDH, hypertension, tardive dyskinesia and breast cancer status post lumpectomy, who presented to the emergency room with acute onset of recurrent falls.  Head CT scan revealed acute subdural hemorrhage layering along the left tentorial leaflet that is 3 mm thick and along the right cerebral convexity that is 2 mm thick with no significant mass effect.  It showed right posterior scalp contusion without acute fracture and chronic microvascular disease as well as cerebral atrophy. Maxillofacial CT which was motion limited without evidence of fracture. C-spine CT showed no evidence for acute fracture or traumatic malalignment.  It showed severe multilevel degenerative change and emphysema.    Assessment & Plan:   Principal Problem:   Subdural hematoma Active Problems:   Hyponatremia   Alcohol abuse   Hypertension   Anemia   GERD (gastroesophageal reflux disease)   Hypomagnesemia   COPD (chronic obstructive pulmonary disease) (HCC)  Subdural hematomas both acute and chronic Probably secondary to falls from alcohol intoxication. Repeat CT ordered and neurosurgery consulted and recommendations given.  No further intervention at this time.  Patient has facial trauma and a facial laceration, has been more than 24 hours.  Dr. Janace Hoard consulted for further evaluation of facial laceration. Therapy evaluations ordered    Hyponatremia probably secondary to chronic alcohol abuse Gently hydrate and repeat BMP in the morning.   Hypomagnesemia Replaced Recheck electrolytes in the morning.    Alcohol abuse and intoxication On alcohol  withdrawal protocol. Unfortunately it is a chronic issue and patient has been in and out of rehabs.  She also has a 24-hour caregiver living with her.    Essential hypertension Blood pressure parameters appear to be optimal    COPD No wheezing heard on exam.   History of seizure disorders in the setting of prior subdural hematomas Continue with Keppra    Macrocytic anemia Hemoglobin stable around 10 Continue with vitamin B12 levels.   DVT prophylaxis: SCDs  Code Status: Full code.  Family Communication: None at bedside  Disposition:   Status is: Inpatient Remains inpatient appropriate because: Alcohol intoxication, subdural hematomas           Consultants:  Palliative.  ENT/ face trauma.   Procedures: none  Antimicrobials:  Antibiotics Given (last 72 hours)     None         Subjective: Somnolent from ativan and haldol.   Objective: Vitals:   04/16/21 0129 04/16/21 0317 04/16/21 0757 04/16/21 1100  BP: 110/82 (!) 143/93 113/69 (!) 141/82  Pulse: 83 82 66 68  Resp: 20 20 20 18   Temp:  97.8 F (36.6 C)  97.9 F (36.6 C)  TempSrc:  Axillary  Axillary  SpO2:  94% 98% 100%  Weight:      Height:        Intake/Output Summary (Last 24 hours) at 04/16/2021 1434 Last data filed at 04/16/2021 1237 Gross per 24 hour  Intake 678.94 ml  Output 200 ml  Net 478.94 ml   Filed Weights   04/15/21 1451  Weight: 61.2 kg    Examination:  General exam: ill appearing lady, bruised , has a facial laceration, not in  distress.  Respiratory system: Clear to auscultation. Respiratory effort normal. Cardiovascular system: S1 & S2 heard, RRR.  No pedal edema. Gastrointestinal system: Abdomen is nondistended, soft and nontender. Normal bowel sounds heard. Central nervous system: somnolent, not following commands.  Extremities: multiple bruising on the lower extremities.  Skin: bruising and multiple scabs/ wounds on the lower extremities and upper  extremities,  Psychiatry: unable to assess, pt somnolent      Data Reviewed: I have personally reviewed following labs and imaging studies  CBC: Recent Labs  Lab 04/15/21 1458 04/16/21 0344  WBC 7.0 5.7  NEUTROABS 5.1  --   HGB 10.1* 10.7*  HCT 29.7* 29.4*  MCV 103.5* 100.7*  PLT 291 097    Basic Metabolic Panel: Recent Labs  Lab 04/15/21 1458 04/16/21 0344  NA 120* 125*  K 3.6 3.6  CL 80* 86*  CO2 26 27  GLUCOSE 60* 86  BUN 8 10  CREATININE 0.76 0.71  CALCIUM 8.0* 8.1*  MG 1.0* 1.9  PHOS  --  3.6    GFR: Estimated Creatinine Clearance: 52.3 mL/min (by C-G formula based on SCr of 0.71 mg/dL).  Liver Function Tests: Recent Labs  Lab 04/15/21 1458  AST 48*  ALT 40  ALKPHOS 75  BILITOT 0.6  PROT 5.1*  ALBUMIN 3.0*    CBG: Recent Labs  Lab 04/15/21 2014  GLUCAP 80     Recent Results (from the past 240 hour(s))  Resp Panel by RT-PCR (Flu A&B, Covid) Nasopharyngeal Swab     Status: None   Collection Time: 04/15/21  7:35 PM   Specimen: Nasopharyngeal Swab; Nasopharyngeal(NP) swabs in vial transport medium  Result Value Ref Range Status   SARS Coronavirus 2 by RT PCR NEGATIVE NEGATIVE Final    Comment: (NOTE) SARS-CoV-2 target nucleic acids are NOT DETECTED.  The SARS-CoV-2 RNA is generally detectable in upper respiratory specimens during the acute phase of infection. The lowest concentration of SARS-CoV-2 viral copies this assay can detect is 138 copies/mL. A negative result does not preclude SARS-Cov-2 infection and should not be used as the sole basis for treatment or other patient management decisions. A negative result may occur with  improper specimen collection/handling, submission of specimen other than nasopharyngeal swab, presence of viral mutation(s) within the areas targeted by this assay, and inadequate number of viral copies(<138 copies/mL). A negative result must be combined with clinical observations, patient history, and  epidemiological information. The expected result is Negative.  Fact Sheet for Patients:  EntrepreneurPulse.com.au  Fact Sheet for Healthcare Providers:  IncredibleEmployment.be  This test is no t yet approved or cleared by the [place] FDA and  has been authorized for detection and/or diagnosis of SARS-CoV-2 by FDA under an Emergency Use Authorization (EUA). This EUA will remain  in effect (meaning this test can be used) for the duration of the COVID-19 declaration under Section 564(b)(1) of the Act, 21 U.S.C.section 360bbb-3(b)(1), unless the authorization is terminated  or revoked sooner.       Influenza A by PCR NEGATIVE NEGATIVE Final   Influenza B by PCR NEGATIVE NEGATIVE Final    Comment: (NOTE) The Xpert Xpress SARS-CoV-2/FLU/RSV plus assay is intended as an aid in the diagnosis of influenza from Nasopharyngeal swab specimens and should not be used as a sole basis for treatment. Nasal washings and aspirates are unacceptable for Xpert Xpress SARS-CoV-2/FLU/RSV testing.  Fact Sheet for Patients: EntrepreneurPulse.com.au  Fact Sheet for Healthcare Providers: IncredibleEmployment.be  This test is not yet approved or cleared  by the [place] and has been authorized for detection and/or diagnosis of SARS-CoV-2 by FDA under an Emergency Use Authorization (EUA). This EUA will remain in effect (meaning this test can be used) for the duration of the COVID-19 declaration under Section 564(b)(1) of the Act, 21 U.S.C. section 360bbb-3(b)(1), unless the authorization is terminated or revoked.  Performed at [facility], [place] [address] [place], [place] [address]          Radiology Studies: CT Head Wo Contrast  Result Date: 04/15/2021 CLINICAL DATA:  Follow-up subdural hematoma. EXAM: CT HEAD WITHOUT CONTRAST TECHNIQUE: Contiguous axial images were obtained from the base of the skull  through the vertex without intravenous contrast. RADIATION DOSE REDUCTION: This exam was performed according to the departmental dose-optimization program which includes automated exposure control, adjustment of the mA and/or kV according to patient size and/or use of iterative reconstruction technique. COMPARISON:  5 hours prior FINDINGS: Brain: Unchanged acute subdural hemorrhage layering along the left tentorium, measuring up to 3 mm in thickness, series 5, image 43. There is slightly decreasing density suggesting some interval dispersion. This thin, 2 mm, subdural hemorrhage along the right frontotemporal convexity is not significantly changed, for example series 5, image 31. there may be a thin, 2-3 mm a isodense subdural collection overlying the left frontoparietal convexity, series 5, image 44 and series 3, image 21. There is no significant mass effect or midline shift from any of these subdural hemorrhages. Stable degree of atrophy and chronic small vessel ischemia. No acute infarct. Vascular: Skull base atherosclerosis.  No hyperdense vessel Skull: No skull fracture. Sinuses/Orbits: Assessed earlier on face CT. Other: Right parietal scalp hematoma. IMPRESSION: 1. Unchanged acute subdural hemorrhage layering along the left tentorium, measuring up to 3 mm in thickness. There is slightly decreasing density suggesting some interval dispersion. 2. Unchanged thin, 2 mm, subdural hemorrhage along the right frontotemporal convexity. 3. Possible thin, 2-3 mm isodense subdural collection overlying the left frontoparietal convexity, not seen previously. 4. No significant mass effect or midline shift from any of these subdural hemorrhages. 5. Stable atrophy and chronic small vessel ischemia. Electronically Signed   By: Keith Rake M.D.   On: 04/15/2021 21:10   CT Head Wo Contrast  Result Date: 04/15/2021 CLINICAL DATA:  Head trauma, minor (Age >= 65y); Neck trauma (Age >= 65y); Facial trauma, blunt EXAM: CT  HEAD WITHOUT CONTRAST CT MAXILLOFACIAL WITHOUT CONTRAST CT CERVICAL SPINE WITHOUT CONTRAST TECHNIQUE: Multidetector CT imaging of the head, cervical spine, and maxillofacial structures were performed using the standard protocol without intravenous contrast. Multiplanar CT image reconstructions of the cervical spine and maxillofacial structures were also generated. RADIATION DOSE REDUCTION: This exam was performed according to the departmental dose-optimization program which includes automated exposure control, adjustment of the mA and/or kV according to patient size and/or use of iterative reconstruction technique. COMPARISON:  April 15, 2021 FINDINGS: CT HEAD FINDINGS Brain: Acute subdural hemorrhage layering along the left tentorial leaflet, measuring up to 3 mm in thickness. Thin (2 mm thick) acute subdural hemorrhage along the right frontotemporal convexity. No evidence of acute large vascular territory infarct. Moderate patchy and confluent white matter hypoattenuation, nonspecific but compatible with chronic microvascular ischemic disease. No midline shift, mass lesion, or hydrocephalus. Cerebral atrophy with ex vacuo ventricular dilation. Vascular: Calcific atherosclerosis. Skull: Right posterior scalp contusion without acute fracture. Other: No mastoid effusions. CT MAXILLOFACIAL FINDINGS Mildly motion limited. Osseous: No fracture or mandibular dislocation. No destructive process. Orbits: Negative. No traumatic or  inflammatory finding. Sinuses: Mucosal thickening of the inferior right maxillary sinus. Fluid layering in a small left sphenoid sinus. Otherwise, clear sinuses. Soft tissues: Negative. CT CERVICAL SPINE FINDINGS Alignment: Similar alignment, including mild anterolisthesis of C3 on C4, C4 on C5 and C7 on T1. broad levocurvature. Skull base and vertebrae: No evidence of acute fracture. Stable bony fusion across the C4-C5 disc space with similar anterior height loss and focal kyphosis at this  level. No new vertebral body height loss. Soft tissues and spinal canal: No prevertebral fluid or swelling. No visible canal hematoma. Disc levels: Stable severe degenerative disc disease at C5-C6 and C6-C7. Similar severe multilevel facet arthropathy and severe craniocervical degenerative change. Upper chest: Emphysema with similar scarring in the lung apices. 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 IMPRESSION: CT head: 1. Acute subdural hemorrhage layering along the left tentorial leaflet (3 mm thick) and along the right cerebral convexity (2 mm thick). No significant mass effect. 2. Right posterior scalp contusion without acute fracture. 3. 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 CT maxillofacial: Motion limited without evidence of acute fracture. CT cervical spine: 1. No evidence of acute fracture or traumatic malalignment. 2. Similar severe multilevel degenerative change. 3.  Emphysema (ICD10-J43.9). Findings discussed with provider Norval Gable, [place] via telephone at 4:52 PM. Electronically Signed   By: Margaretha Sheffield M.D.   On: 04/15/2021 16:54   CT Cervical Spine Wo Contrast  Result Date: 04/15/2021 CLINICAL DATA:  Head trauma, minor (Age >= 65y); Neck trauma (Age >= 65y); Facial trauma, blunt EXAM: CT HEAD WITHOUT CONTRAST CT MAXILLOFACIAL WITHOUT CONTRAST CT CERVICAL SPINE WITHOUT CONTRAST TECHNIQUE: Multidetector CT imaging of the head, cervical spine, and maxillofacial structures were performed using the standard protocol without intravenous contrast. Multiplanar CT image reconstructions of the cervical spine and maxillofacial structures were also generated. RADIATION DOSE REDUCTION: This exam was performed according to the departmental dose-optimization program which includes automated exposure control, adjustment of the mA and/or kV according to patient size and/or use of iterative reconstruction technique. COMPARISON:  April 15, 2021 FINDINGS: CT HEAD FINDINGS  Brain: Acute subdural hemorrhage layering along the left tentorial leaflet, measuring up to 3 mm in thickness. Thin (2 mm thick) acute subdural hemorrhage along the right frontotemporal convexity. No evidence of acute large vascular territory infarct. Moderate patchy and confluent white matter hypoattenuation, nonspecific but compatible with chronic microvascular ischemic disease. No midline shift, mass lesion, or hydrocephalus. Cerebral atrophy with ex vacuo ventricular dilation. Vascular: Calcific atherosclerosis. Skull: Right posterior scalp contusion without acute fracture. Other: No mastoid effusions. CT MAXILLOFACIAL FINDINGS Mildly motion limited. Osseous: No fracture or mandibular dislocation. No destructive process. Orbits: Negative. No traumatic or inflammatory finding. Sinuses: Mucosal thickening of the inferior right maxillary sinus. Fluid layering in a small left sphenoid sinus. Otherwise, clear sinuses. Soft tissues: Negative. CT CERVICAL SPINE FINDINGS Alignment: Similar alignment, including mild anterolisthesis of C3 on C4, C4 on C5 and C7 on T1. broad levocurvature. Skull base and vertebrae: No evidence of acute fracture. Stable bony fusion across the C4-C5 disc space with similar anterior height loss and focal kyphosis at this level. No new vertebral body height loss. Soft tissues and spinal canal: No prevertebral fluid or swelling. No visible canal hematoma. Disc levels: Stable severe degenerative disc disease at C5-C6 and C6-C7. Similar severe multilevel facet arthropathy and severe craniocervical degenerative change. Upper chest: Emphysema with similar scarring in the lung apices. 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 IMPRESSION: CT head: 1. Acute subdural hemorrhage layering  along the left tentorial leaflet (3 mm thick) and along the right cerebral convexity (2 mm thick). No significant mass effect. 2. Right posterior scalp contusion without acute fracture. 3. Chronic microvascular  disease and cerebral atrophy (ICD10-G31.9). CT maxillofacial: Motion limited without evidence of acute fracture. CT cervical spine: 1. No evidence of acute fracture or traumatic malalignment. 2. Similar severe multilevel degenerative change. 3.  Emphysema (ICD10-J43.9). Findings discussed with provider Norval Gable, [place] via telephone at 4:52 PM. Electronically Signed   By: Margaretha Sheffield M.D.   On: 04/15/2021 16:54   CT CHEST ABDOMEN PELVIS W CONTRAST  Result Date: 04/15/2021 CLINICAL DATA:  Trauma, fall EXAM: CT CHEST, ABDOMEN, AND PELVIS WITH CONTRAST TECHNIQUE: Multidetector CT imaging of the chest, abdomen and pelvis was performed following the standard protocol during bolus administration of intravenous contrast. RADIATION DOSE REDUCTION: This exam was performed according to the departmental dose-optimization program which includes automated exposure control, adjustment of the mA and/or kV according to patient size and/or use of iterative reconstruction technique. CONTRAST:  173mL OMNIPAQUE IOHEXOL 300 MG/ML  SOLN COMPARISON:  04/07/2021 FINDINGS: CT CHEST FINDINGS Cardiovascular: There is homogeneous enhancement in thoracic aorta. There are no filling defects in central pulmonary artery branches. Mediastinum/Nodes: There is no evidence of mediastinal hematoma. There is no significant lymphadenopathy. Lungs/Pleura: Severe centrilobular and panlobular emphysema is seen. There are patchy infiltrates in the right middle lobe and right lower lobe with interval worsening of infiltrate in the right lower lobe. Small right pleural effusion is seen. Linear densities seen in both apices, more so on the right side with no significant interval change. There is no pneumothorax. Musculoskeletal: No displaced fractures are seen. CT ABDOMEN PELVIS FINDINGS Hepatobiliary: Liver measures 14.7 cm. There is fatty infiltration. There are few low-density lesions in the liver largest in the left lobe measuring approximately 1.4 cm.  Gallbladder is not seen. There is no significant dilation of bile ducts. Pancreas: No focal abnormality is seen. Spleen: Unremarkable. Adrenals/Urinary Tract: Adrenals are unremarkable. There is no demonstrable cortical laceration. There is no hydronephrosis. Ureters are not dilated. Urinary bladder is unremarkable. Stomach/Bowel: Stomach is unremarkable. Small bowel loops are not dilated. Appendix is not distinctly seen. There is no pericecal inflammation. There is no significant wall thickening in colon. There is no pericolic stranding. Vascular/Lymphatic: There are scattered atherosclerotic plaques and calcifications in the aorta and its major branches. Reproductive: Uterus is not seen. Other: There is no ascites or pneumoperitoneum. Small umbilical hernia containing fat is seen. Possible small right inguinal hernia containing fat is seen. Augmentation/reconstruction prostheses are seen in both breasts. Musculoskeletal: No displaced fractures are seen. Degenerative changes are noted in the lower lumbar spine with encroachment of neural foramina. There is first-degree spondylolisthesis and spinal stenosis at L4-L5 level. IMPRESSION: There is no evidence of mediastinal or retroperitoneal hematoma. There are linear patchy infiltrates in the right middle lobe and right lower lobe with interval worsening in the right lower lobe suggesting atelectasis/pneumonia. There is interval appearance of small right pleural effusion. There is no demonstrable laceration in solid organs. There is no ascites or pneumoperitoneum. Fatty liver. There are low-density lesions in the liver suggesting cysts or hemangiomas or some other space-occupying neoplastic process. Severe centrilobular and panlobular emphysema. Lumbar spondylosis with spinal stenosis at L4-L5 level. Other findings as described in the body of the report. Electronically Signed   By: Elmer Picker M.D.   On: 04/15/2021 16:50   DG Humerus Left  Result Date:  04/15/2021 CLINICAL DATA:  Fall  EXAM: LEFT HUMERUS - 2+ VIEW COMPARISON:  None. FINDINGS: There is no evidence of fracture or other focal bone lesions. Soft tissues are unremarkable. IMPRESSION: Negative. Electronically Signed   By: Franchot Gallo M.D.   On: 04/15/2021 15:44   DG Humerus Right  Result Date: 04/15/2021 CLINICAL DATA:  Fall EXAM: RIGHT HUMERUS - 2+ VIEW COMPARISON:  None. FINDINGS: There is no evidence of fracture or other focal bone lesions. Soft tissues are unremarkable. IMPRESSION: Negative. Electronically Signed   By: Franchot Gallo M.D.   On: 04/15/2021 15:43   CT US GUIDE VASC ACCESS LT NO REPORT  Result Date: 04/15/2021 There is no Radiologist interpretation  for this exam.  CT Maxillofacial WO CM  Result Date: 04/15/2021 CLINICAL DATA:  Head trauma, minor (Age >= 65y); Neck trauma (Age >= 65y); Facial trauma, blunt EXAM: CT HEAD WITHOUT CONTRAST CT MAXILLOFACIAL WITHOUT CONTRAST CT CERVICAL SPINE WITHOUT CONTRAST TECHNIQUE: Multidetector CT imaging of the head, cervical spine, and maxillofacial structures were performed using the standard protocol without intravenous contrast. Multiplanar CT image reconstructions of the cervical spine and maxillofacial structures were also generated. RADIATION DOSE REDUCTION: This exam was performed according to the departmental dose-optimization program which includes automated exposure control, adjustment of the mA and/or kV according to patient size and/or use of iterative reconstruction technique. COMPARISON:  April 15, 2021 FINDINGS: CT HEAD FINDINGS Brain: Acute subdural hemorrhage layering along the left tentorial leaflet, measuring up to 3 mm in thickness. Thin (2 mm thick) acute subdural hemorrhage along the right frontotemporal convexity. No evidence of acute large vascular territory infarct. Moderate patchy and confluent white matter hypoattenuation, nonspecific but compatible with chronic microvascular ischemic disease. No midline  shift, mass lesion, or hydrocephalus. Cerebral atrophy with ex vacuo ventricular dilation. Vascular: Calcific atherosclerosis. Skull: Right posterior scalp contusion without acute fracture. Other: No mastoid effusions. CT MAXILLOFACIAL FINDINGS Mildly motion limited. Osseous: No fracture or mandibular dislocation. No destructive process. Orbits: Negative. No traumatic or inflammatory finding. Sinuses: Mucosal thickening of the inferior right maxillary sinus. Fluid layering in a small left sphenoid sinus. Otherwise, clear sinuses. Soft tissues: Negative. CT CERVICAL SPINE FINDINGS Alignment: Similar alignment, including mild anterolisthesis of C3 on C4, C4 on C5 and C7 on T1. broad levocurvature. Skull base and vertebrae: No evidence of acute fracture. Stable bony fusion across the C4-C5 disc space with similar anterior height loss and focal kyphosis at this level. No new vertebral body height loss. Soft tissues and spinal canal: No prevertebral fluid or swelling. No visible canal hematoma. Disc levels: Stable severe degenerative disc disease at C5-C6 and C6-C7. Similar severe multilevel facet arthropathy and severe craniocervical degenerative change. Upper chest: Emphysema with similar scarring in the lung apices. 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 IMPRESSION: CT head: 1. Acute subdural hemorrhage layering along the left tentorial leaflet (3 mm thick) and along the right cerebral convexity (2 mm thick). No significant mass effect. 2. Right posterior scalp contusion without acute fracture. 3. 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 CT maxillofacial: Motion limited without evidence of acute fracture. CT cervical spine: 1. No evidence of acute fracture or traumatic malalignment. 2. Similar severe multilevel degenerative change. 3.  Emphysema (ICD10-J43.9). Findings discussed with provider Norval Gable, [place] via telephone at 4:52 PM. Electronically Signed   By: Margaretha Sheffield M.D.   On:  04/15/2021 16:54        Scheduled Meds:  folic acid  1 mg Oral Daily   levETIRAcetam  1,000 mg Oral BID  melatonin  10 mg Oral QHS   metoprolol succinate  50 mg Oral Daily   multivitamin with minerals  1 tablet Oral Daily   oxybutynin  10 mg Oral q morning   PHENobarbital  32.4 mg Oral BID   sodium chloride flush  10-40 mL Intracatheter Q12H   thiamine  100 mg Oral Daily   Or   thiamine  100 mg Intravenous Daily   vitamin B-12  3,000 mcg Oral q morning   Continuous Infusions:  sodium chloride Stopped (04/15/21 2200)   sodium chloride 100 mL/hr at 04/16/21 1114   MVI, Thiamine, Folic Acid in NS 9702 mL 100 mL/hr at 04/15/21 2303     LOS: 1 day    Time spent: 42 minutes    Hosie Poisson, MD [facility]   To contact the attending provider between 7A-7P or the covering provider during after hours 7P-7A, please log into the web site [email or web address] and access using universal [place] password for that web site. If you do not have the password, please call the hospital operator.  04/16/2021, 2:34 PM

## 2021-04-16 NOTE — Consult Note (Signed)
Consultation Note Date: 04/16/2021   Patient Name: Mindy Young  DOB: Feb 24, 1945  MRN: [id number]  Age / Sex: 76 y.o., female  PCP: Loura Pardon, MD Referring Physician: Hosie Poisson, MD  Reason for Consultation: Establishing goals of care  HPI/Patient Profile: 76 y.o. female  admitted on 04/15/2021 with medical history significant for alcohol abuse, anxiety, depression, GERD, seizure disorder and previous SDH, hypertension, tardive dyskinesia and breast cancer status post lumpectomy, who presented to the emergency room with acute onset of recurrent falls.    She had a fall 3 days ago with subsequent right facial wound     When she came to the ER, vital signs were within normal.  Labs revealed significant hyponatremia 120 and hypochloremia of 80 compared to 128 and 90 on 2/15, with a calcium of 8 and albumin of 3, total protein of 5.1 and magnesium level of 1 with a potassium of 3.6.  CBC showed anemia with hemoglobin of 10.1 and hematocrit 29.7 close to previous levels with macrocytosis.  Her blood glucose was 60 and alcohol abuse 62.  Imaging: Head CT scan revealed acute subdural hemorrhage layering along the left tentorial leaflet that is 3 mm thick and along the right cerebral convexity that is 2 mm thick with no significant mass effect.  It showed right posterior scalp contusion without acute fracture and chronic microvascular disease as well as cerebral atrophy.  Maxillofacial CT which was motion limited without evidence of fracture.  C-spine CT showed no evidence for acute fracture or traumatic malalignment.  It showed severe multilevel degenerative change and emphysema.  Admitted for treatment stabilization.  Patient and family face treatment option decisions, advanced directive decisions and anticipatory care needs.   Clinical Assessment and Goals of Care:   This NP Wadie Lessen reviewed  medical records, received report from team, assessed the patient and then meet at the patient's bedside  to discuss diagnosis, prognosis, GOC, EOL wishes disposition and options.     Patient is extremely lethargic and unable to participate and meaningful conversation at this point in time, however she was able to communicate to me that Mindy Young is her main support person.  Mindy Young is her in-home caregiver, she has been caring for her since May 2022.    I spoke to Mindy Young by telephone.  I also spoke to patient's cousin Mindy Young by telephone.    Education offered on the concept of Palliative Care as specialized medical care for people and their families living with serious illness.  If focuses on providing relief from the symptoms and stress of a serious illness.  The goal is to improve quality of life for both the patient and the family.   Both Mindy Young and Manuela Schwartz verbalize great concern over the patient's life choices specifically as it relates to alcohol consumption.  It continues to worsen, patient continues to decline physically, functionally and cognitively.  Apparently she admits herself for detox only to leave before treatment.   Education offered on the disease of alcohol abuse/misuse.  Education offered specifically as it relates the patient capacity and her right to make her own decisions even if those decisions seem to be self-defeating.  I verbalized my concerns that likely patient will continue to stabilize and make decision to discharge home back to the same environment that brought her in.  Education offered on Al-Anon as a support for friends and families.     Questions and concerns addressed.  Patient  encouraged to call with questions or concerns.     PMT will continue to support holistically.          Raised awareness that there is no documented healthcare power of attorney or advanced care planning documents on file.  Encouraged family to secure documents when  opportunity permits.  Mindy Young tells me that patient has a daughter Mindy Young who lives in [place].  It seems that she is power of attorney.  I attempted to reach out but was unable to leave a voicemail at the given number of [phone]      SUMMARY OF RECOMMENDATIONS    Code Status/Advance Care Planning:  Full code   Symptom Management:  Per sttending  Palliative Prophylaxis:  Aspiration, Delirium Protocol, and Frequent Pain Assessment  Additional Recommendations (Limitations, Scope, Preferences): Full Scope Treatment  Psycho-social/Spiritual:  Desire for further Chaplaincy support:no   Prognosis:  Unable to determine  Discharge Planning: To Be Determined      Primary Diagnoses: Present on Admission:  Subdural hematoma   I have reviewed the medical record, interviewed the patient and family, and examined the patient. The following aspects are pertinent.  Past Medical History:  Diagnosis Date   Alcohol abuse    Allergy    Anemia 06/29/2011   Anxiety    Arthritis    Breast cancer ([place])    right breast   C1 cervical fracture ([place]) 02/1997   Cancer ([place])    melanoma   Depression    Family history of breast cancer    Family history of prostate cancer    GERD (gastroesophageal reflux disease)    Hypertension    Tardive dyskinesia    Social History   Socioeconomic History   Marital status: Married    Spouse name: Not on file   Number of children: 2   Years of education: Not on file   Highest education level: Not on file  Occupational History   Not on file  Tobacco Use   Smoking status: Every Day    Packs/day: 1.00    Types: Cigarettes   Smokeless tobacco: Never   Tobacco comments:    e-cigs  Vaping Use   Vaping Use: Former  Substance and Sexual Activity   Alcohol use: Yes    Alcohol/week: 21.0 standard drinks    Types: 21 Standard drinks or equivalent per week    Comment: 2 per weekend day; denies during the week (not consistent with  prior hx)   Drug use: No   Sexual activity: Not on file  Other Topics Concern   Not on file  Social History Narrative   Right Handed    Lives in a one story home   Social Determinants of Health   Financial Resource Strain: Not on file  Food Insecurity: Not on file  Transportation Needs: Not on file  Physical Activity: Not on file  Stress: Not on file  Social Connections: Not on file   Family History  Problem Relation Age of Onset   COPD Mother    Prostate cancer Father  75       seed implant for treatment   Colon polyps Father        'a few'   Breast cancer Sister 63   Breast cancer Maternal Aunt        dx >50   Breast cancer Other 35       bilateral   Colon cancer Neg Hx    Scheduled Meds:  folic acid  1 mg Oral Daily   levETIRAcetam  1,000 mg Oral BID   melatonin  10 mg Oral QHS   metoprolol succinate  50 mg Oral Daily   multivitamin with minerals  1 tablet Oral Daily   oxybutynin  10 mg Oral q morning   PHENobarbital  32.4 mg Oral BID   sodium chloride flush  10-40 mL Intracatheter Q12H   thiamine  100 mg Oral Daily   Or   thiamine  100 mg Intravenous Daily   vitamin B-12  3,000 mcg Oral q morning   Continuous Infusions:  sodium chloride Stopped (04/15/21 2200)   sodium chloride 100 mL/hr at 04/16/21 1114   MVI, Thiamine, Folic Acid in NS 7408 mL 100 mL/hr at 04/15/21 2303   PRN Meds:.acetaminophen **OR** acetaminophen, albuterol, diphenoxylate-atropine, LORazepam, LORazepam **OR** LORazepam, magnesium hydroxide, sodium chloride flush, traZODone Medications Prior to Admission:  Prior to Admission medications   Medication Sig Start Date End Date Taking? Authorizing Provider  acamprosate (CAMPRAL) 333 MG tablet Take 666 mg by mouth 3 (three) times daily. 04/12/21  Yes [provider]  albuterol (VENTOLIN HFA) 108 (90 Base) MCG/ACT inhaler Inhale 1-2 puffs into the lungs every 4 (four) hours as needed for wheezing or shortness of breath. 10/28/20  Yes  [provider]  Cyanocobalamin 3000 MCG CAPS Take 3,000 mcg by mouth every evening.   Yes [provider]  diphenoxylate-atropine (LOMOTIL) 2.5-0.025 MG tablet Take 1 tablet by mouth 4 (four) times daily as needed for diarrhea or loose stools. 02/23/21  Yes Vann, Jessica U, DO  doxycycline (MONODOX) 100 MG capsule Take 100 mg by mouth 2 (two) times daily. 04/12/21  Yes [provider]  fluticasone-salmeterol (ADVAIR) 250-50 MCG/ACT AEPB Inhale 1 puff into the lungs 2 (two) times daily as needed (shortness of breath/wheezing).   Yes [provider]  folic acid (FOLVITE) 1 MG tablet Take 1 tablet (1 mg total) by mouth daily. 02/24/21  Yes Vann, Jessica U, DO  gabapentin (NEURONTIN) 100 MG capsule Take 100 mg by mouth 3 (three) times daily as needed for pain. 04/09/21  Yes [provider]  levETIRAcetam (KEPPRA) 1000 MG tablet Take 1 tablet (1,000 mg total) by mouth 2 (two) times daily. 03/04/21  Yes Rondel Jumbo, PA-C  lisinopril (ZESTRIL) 5 MG tablet Take 5 mg by mouth daily.   Yes [provider]  losartan (COZAAR) 25 MG tablet Take 25 mg by mouth daily. 02/25/21  Yes [provider]  Melatonin 10 MG TABS Take 10 mg by mouth at bedtime.   Yes [provider]  metoprolol succinate (TOPROL-XL) 50 MG 24 hr tablet Take 1 tablet (50 mg total) by mouth daily. Take with or immediately following a meal. Patient taking differently: Take 50 mg by mouth daily. 03/09/21 06/07/21 Yes Loel Dubonnet, NP  mirtazapine (REMERON) 15 MG tablet Take 15 mg by mouth at bedtime. 02/25/21  Yes [provider]  mupirocin ointment (BACTROBAN) 2 % Apply 1 application topically daily. 04/06/21  Yes [provider]  ondansetron (ZOFRAN) 4 MG  tablet Take 4 mg by mouth every 8 (eight) hours as needed for nausea or vomiting.   Yes [provider]  oxybutynin (DITROPAN-XL) 10 MG 24 hr tablet Take 10 mg by mouth every morning. 12/05/16  Yes  [provider]  thiamine 100 MG tablet Take 1 tablet (100 mg total) by mouth daily. 02/24/21  Yes Vann, Jessica U, DO  PHENObarbital (LUMINAL) 30 MG tablet Take 30 mg by mouth 3 (three) times daily. 03/12/21   [provider]  PHENobarbital (LUMINAL) 32.4 MG tablet Take 1 tablet (32.4 mg total) by mouth 2 (two) times daily. Patient not taking: Reported on 03/04/2021 02/23/21   Geradine Girt, DO   Allergies  Allergen Reactions   Percocet [Oxycodone-Acetaminophen] Other (See Comments)    "bugs crawling on me"   Review of Systems  Unable to perform ROS: Acuity of condition   Physical Exam  Vital Signs: BP (!) 141/82 (BP Location: Left Wrist)    Pulse 68    Temp 97.9 F (36.6 C) (Axillary)    Resp 18    Ht 5\' 2"  (1.575 m)    Wt 61.2 kg    SpO2 100%    BMI 24.69 kg/m  Pain Scale: Faces   Pain Score: 0-No pain (pt also denies when can awaken enough to tell you)   SpO2: SpO2: 100 % O2 Device:SpO2: 100 % O2 Flow Rate: .   IO: Intake/output summary:  Intake/Output Summary (Last 24 hours) at 04/16/2021 1203 Last data filed at 04/16/2021 0400 Gross per 24 hour  Intake 678.94 ml  Output --  Net 678.94 ml    LBM: Last BM Date : 04/14/21 Baseline Weight: Weight: 61.2 kg Most recent weight: Weight: 61.2 kg     Palliative Assessment/Data:   Discussed with Dr Karleen Hampshire and [facility] team    Signed by: Wadie Lessen, NP   Please contact Palliative Medicine Team phone at [phone] for questions and concerns.  For individual provider: See Shea Evans

## 2021-04-16 NOTE — Progress Notes (Signed)
Contacted Dr. Cyd Silence due to patient having increasing CIWA scores and becoming increasingly agitated and belligerent to staff.  Pt has been attempting to hoist herself over side rails as well as cursing at staff.  Pt is unable to be redirected and medication not helpful at this time.  This RN having to remain at bedside due to patient being impulsive and a danger to her safety at this time.

## 2021-04-16 NOTE — Progress Notes (Signed)
CSW acknowledging consult for substance abuse counseling. Patient still on CIWA, not oriented at this time. CSW to meet with patient and provide resources when appropriate.   Laveda Abbe, [place] Clinical Social Worker [phone]

## 2021-04-16 NOTE — Progress Notes (Signed)

## 2021-04-16 NOTE — Evaluation (Addendum)
Clinical/Bedside Swallow Evaluation Patient Details  Name: Mindy Young MRN: [id number] Date of Birth: 1945-12-16  Today's Date: 04/16/2021 Time: SLP Start Time (ACUTE ONLY): 0919 SLP Stop Time (ACUTE ONLY): 6759 SLP Time Calculation (min) (ACUTE ONLY): 15 min  Past Medical History:  Past Medical History:  Diagnosis Date   Alcohol abuse    Allergy    Anemia 06/29/2011   Anxiety    Arthritis    Breast cancer ([place])    right breast   C1 cervical fracture ([place]) 02/1997   Cancer ([place])    melanoma   Depression    Family history of breast cancer    Family history of prostate cancer    GERD (gastroesophageal reflux disease)    Hypertension    Tardive dyskinesia    Past Surgical History:  Past Surgical History:  Procedure Laterality Date   ABDOMINAL HYSTERECTOMY     ASPIRATION OF ABSCESS Right 11/20/2017   Procedure: ASPIRATION OF RIGHT AXILLARY SEROMA;  Surgeon: Rolm Bookbinder, MD;  Location: [facility];  Service: General;  Laterality: Right;   AUGMENTATION MAMMAPLASTY Bilateral    biateral implants , approx 2015   BREAST LUMPECTOMY Right 11/02/2017   re-ex 11-20-17   BREAST LUMPECTOMY WITH RADIOACTIVE SEED AND SENTINEL LYMPH NODE BIOPSY Right 11/02/2017   Procedure: BREAST LUMPECTOMY WITH RADIOACTIVE SEED AND SENTINEL LYMPH NODE BIOPSY;  Surgeon: Rolm Bookbinder, MD;  Location: [facility];  Service: General;  Laterality: Right;   BRONCHIAL WASHINGS  02/21/2021   Procedure: BRONCHIAL WASHINGS;  Surgeon: Candee Furbish, MD;  Location: [facility] ENDOSCOPY;  Service: Pulmonary;;   CHOLECYSTECTOMY     collarbone     INCONTINENCE SURGERY     KNEE SURGERY     removal of cyst, repair of cartiledge   LAPAROSCOPY     for endometriosis   RE-EXCISION OF BREAST LUMPECTOMY Right 11/20/2017   Procedure: RE-EXCISION OF RIGHT BREAST MARGINS;  Surgeon: Rolm Bookbinder, MD;  Location: [facility];  Service: General;  Laterality: Right;   ROTATOR CUFF  REPAIR     left   VIDEO BRONCHOSCOPY Right 02/21/2021   Procedure: VIDEO BRONCHOSCOPY WITHOUT FLUORO;  Surgeon: Candee Furbish, MD;  [facility];  Service: Pulmonary;  Laterality: Right;   HPI:  Pt is a 76 y.o. female who presented to the ED with acute onset of recurrent falls. CT head 2/23: Acute subdural hemorrhage layering along the left  tentorium, measuring up to 3 mm in thickness. PMH: alcohol abuse, anxiety, depression, GERD, seizure disorder and previous SDH, hypertension, tardive dyskinesia and breast cancer status post lumpectomy.    Assessment / Plan / Recommendation  Clinical Impression  Pt was seen for bedside swallow evaluation. Verbal output was limited and pt did not provide any meaningful swallowing-related history. Oral mechanism exam was limited due to pt's difficulty following commands; however, oral motor strength and ROM appeared grossly WFL, and she presented with adequate, natural dentition. She presented with symptoms of oropharyngeal dysphagia which are likely at least partly cognitively based. She demonstrated oral holding, prolonged bolus manipulation, prolonged mastication, mild oral with advanced solids, and inconsistent signs of aspiration with thin liquids. A dysphagia 2 diet with nectar thick liquids is recommended at this time, and prognosis for advancement is judged to be good. SLP will follow for diet advancement and/or instrumental assessment as mentation improves. SLP Visit Diagnosis: Dysphagia, unspecified (R13.10)    Aspiration Risk  Mild aspiration risk    Diet Recommendation Dysphagia 2 (  Fine chop);Nectar-thick liquid   Liquid Administration via: Cup;Straw Medication Administration:  (crushed/whole with puree) Supervision: Staff to assist with self feeding;Full supervision/cueing for compensatory strategies Compensations: Slow rate;Small sips/bites;Minimize environmental distractions Postural Changes: Seated upright at 90 degrees    Other   Recommendations Oral Care Recommendations: Oral care BID    Recommendations for follow up therapy are one component of a multi-disciplinary discharge planning process, led by the attending physician.  Recommendations may be updated based on patient status, additional functional criteria and insurance authorization.  Follow up Recommendations  (TBD)      Assistance Recommended at Discharge    Functional Status Assessment Patient has had a recent decline in their functional status and demonstrates the ability to make significant improvements in function in a reasonable and predictable amount of time.  Frequency and Duration min 2x/week  2 weeks       Prognosis Prognosis for Safe Diet Advancement: Good Barriers to Reach Goals: Cognitive deficits      Swallow Study   General Date of Onset: 04/15/21 HPI: Pt is a 76 y.o. female who presented to the ED with acute onset of recurrent falls. CT head 2/23: Acute subdural hemorrhage layering along the left  tentorium, measuring up to 3 mm in thickness. PMH: alcohol abuse, anxiety, depression, GERD, seizure disorder and previous SDH, hypertension, tardive dyskinesia and breast cancer status post lumpectomy. Type of Study: Bedside Swallow Evaluation Previous Swallow Assessment: none Diet Prior to this Study: NPO Temperature Spikes Noted: No Respiratory Status: Room air History of Recent Intubation: No Behavior/Cognition: Alert;Cooperative;Requires cueing;Confused;Doesn't follow directions Oral Cavity Assessment: Within Functional Limits Oral Care Completed by SLP: No Oral Cavity - Dentition: Adequate natural dentition Vision: Functional for self-feeding Self-Feeding Abilities: Needs assist Patient Positioning: Upright in bed;Postural control adequate for testing Baseline Vocal Quality: Normal Volitional Cough: Cognitively unable to elicit Volitional Swallow: Unable to elicit    Oral/Motor/Sensory Function Overall Oral Motor/Sensory  Function: Within functional limits   Ice Chips Ice chips: Within functional limits Presentation: Spoon   Thin Liquid Thin Liquid: Impaired Presentation: Cup;Straw Pharyngeal  Phase Impairments: Throat Clearing - Immediate;Cough - Delayed    Nectar Thick Nectar Thick Liquid: Impaired Presentation: Straw Oral phase functional implications: Prolonged oral transit   Honey Thick Honey Thick Liquid: Not tested   Puree Puree: Impaired Presentation: Spoon Oral Phase Impairments: Poor awareness of bolus Oral Phase Functional Implications: Oral holding;Prolonged oral transit   Solid     Solid: Impaired Oral Phase Impairments: Impaired mastication Oral Phase Functional Implications: Impaired mastication     Sury Wentworth I. Hardin Negus, [place], [place] Office number [phone] Pager [phone]  Horton Marshall 04/16/2021,9:41 AM

## 2021-04-16 NOTE — Consult Note (Signed)
° °  Providing Compassionate, Quality Care - Together  Neurosurgery Consult  Referring physician: Dr. Karleen Hampshire Reason for referral: SDH  Chief Complaint: Fall  History of Present Illness: This is a 76 year old female w history of etoh abuse and anxiety, htn, gerd, BRCA and sz disorder that has had recurrent falls, with mltiple in the most recent 3 days. She has a facial lac and skin abrasion on her left arm. W/u revealed a small acute SDH along L tent and R convexity without mass effect. History if per chart as patient received ativan and is sleepy.   Medications: I have reviewed the patient's current medications. Allergies: No Known Allergies  History reviewed. No pertinent family history. Social History:  has no history on file for tobacco use, alcohol use, and drug use.  ROS: Unable to obtain  Physical Exam:  Vital signs in last 24 hours: Temp:  [98 F (36.7 C)-98.3 F (36.8 C)] 98 F (36.7 C) (07/25 1814) Pulse Rate:  [58-128] 65 (07/26 0746) Resp:  [11-18] 14 (07/26 0217) BP: (138-182)/(65-125) 153/88 (07/26 0700) SpO2:  [91 %-98 %] 96 % (07/26 0746) PE: Opens eyes to voice PERRL Fcx4 Minimally conversant MAE equally R facial lac Face is symmetric with ecchymoses BUE/BLE movement is symmetric and at least antigravity   Impression/Assessment:  77 yo F with  Small acute SDH due to fall  Plan:  -images reviewed -repeat CT stable, with improvement rec f/u as needed -no acute nsx intervention at this time  Thank you for allowing me to participate in this patient's care.  Please do not hesitate to call with questions or concerns.   Elwin Sleight, [facility] Cell: [phone]

## 2021-04-16 NOTE — Evaluation (Signed)
Physical Therapy Evaluation Patient Details Name: Mindy Young MRN: [id number] DOB: 1945/05/24 Today's Date: 04/16/2021  History of Present Illness  76 yo female presented to ED with head trauma resulting from a fall while intoxicated. Pt was found to have Hyponatremia.  Pt has history of falls, at least 3 in the past 2 weeks where she presented to the ED according to he chart. PMHx:  alcohol abuse, anxiety, depression, GERD, seizure disorder and previous SDH, hypertension, tardive dyskinesia, and breast cancer status post lumpectomy, UTI,  Clinical Impression  Pt demonstrated decreased awareness of left side during MMT and sensation testing; With extra cuing isolated strength and sensation was found to be intact. Pt exhibited mild left inattention. She need cuing to use her left extremities and left visual field. She was A+Ox4. She was very impulsive when cued to move. During physical exam patient showed deficits in strength, endurance, activity tolerance. Recommending therapy services at skilled nursing facility to address the previously stated deficits. Will continue to follow acutely to maximize functional mobility, independence, and safety.        Recommendations for follow up therapy are one component of a multi-disciplinary discharge planning process, led by the attending physician.  Recommendations may be updated based on patient status, additional functional criteria and insurance authorization.  Follow Up Recommendations Skilled nursing-short term rehab (<3 hours/day)    Assistance Recommended at Discharge Frequent or constant Supervision/Assistance  Patient can return home with the following  A lot of help with walking and/or transfers;A lot of help with bathing/dressing/bathroom;Assistance with cooking/housework;Direct supervision/assist for financial management;Assistance with feeding;Assist for transportation;Direct supervision/assist for medications management;Help with stairs or  ramp for entrance    Equipment Recommendations None recommended by PT  Recommendations for Other Services       Functional Status Assessment Patient has had a recent decline in their functional status and demonstrates the ability to make significant improvements in function in a reasonable and predictable amount of time.     Precautions / Restrictions Precautions Precautions: Fall Restrictions Weight Bearing Restrictions: No      Mobility  Bed Mobility Overal bed mobility: Needs Assistance Bed Mobility: Sidelying to Sit, Sit to Sidelying   Sidelying to sit: Mod assist     Sit to sidelying: Mod assist General bed mobility comments: pt needed moderate assistance for bed mobility, help with facilitating movements and multimodal cuing for bed mobility.    Transfers Overall transfer level: Needs assistance Equipment used: Standard walker Transfers: Sit to/from Stand Sit to Stand: Min assist           General transfer comment: Pt was impulsive with sit to stand and pulled on the walker while the therapist stablized the walker during STS transfer.    Ambulation/Gait Ambulation/Gait assistance: Mod assist Gait Distance (Feet): 20 Feet Assistive device: Rolling walker (2 wheels) Gait Pattern/deviations: Step-through pattern, Ataxic, Decreased stance time - right       General Gait Details: Pt required mod assist for balance during ambulation.  Stairs            Wheelchair Mobility    Modified Rankin (Stroke Patients Only)       Balance Overall balance assessment: Needs assistance Sitting-balance support: Bilateral upper extremity supported, Feet supported Sitting balance-Leahy Scale: Fair Sitting balance - Comments: Pt was able to sit EOB with supervision and began to experience dizziness in sitting.   Standing balance support: Bilateral upper extremity supported, During functional activity, Reliant on assistive device for balance Standing balance-Leahy  Scale: Fair Standing balance comment: Pt had heavy use of walker with when in standing.                             Pertinent Vitals/Pain Pain Assessment Pain Assessment: No/denies pain    Home Living Family/patient expects to be discharged to:: Private residence Living Arrangements: Alone Available Help at Discharge: Personal care attendant;Available 24 hours/day Type of Home: House Home Access: Stairs to enter Entrance Stairs-Rails: Right Entrance Stairs-Number of Steps: 6   Home Layout: One level Home Equipment: BSC/3in1;Shower seat;Wheelchair - Publishing copy (2 wheels);Cane - single point Additional Comments: pt reports she had an injury and had all the equipment from then,    Prior Function Prior Level of Function : Needs assist       Physical Assist : ADLs (physical);Mobility (physical)   ADLs (physical): Feeding;Bathing Mobility Comments: has RW and single point cane for mobility       Hand Dominance   Dominant Hand: Right    Extremity/Trunk Assessment   Upper Extremity Assessment Upper Extremity Assessment: Defer to OT evaluation    Lower Extremity Assessment Lower Extremity Assessment: Overall WFL for tasks assessed       Communication      Cognition Arousal/Alertness: Awake/alert Behavior During Therapy: Restless, Impulsive Overall Cognitive Status: No family/caregiver present to determine baseline cognitive functioning                                 General Comments: Pt was impulsive with movements and seemed to have some inattention to the left side. She was trying to open her straw with her right hand only. She seemed to have trouble sequencing tasks and displayed signs of impaired problem solving.        General Comments General comments (skin integrity, edema, etc.): Pt had a bowl movment during the session and therapist assisted with hygine.    Exercises     Assessment/Plan    PT Assessment Patient  needs continued PT services  PT Problem List Decreased range of motion;Decreased cognition;Decreased knowledge of use of DME;Decreased balance;Decreased activity tolerance;Decreased safety awareness;Decreased coordination       PT Treatment Interventions DME instruction;Gait training;Stair training;Therapeutic activities;Functional mobility training;Patient/family education;Cognitive remediation;Neuromuscular re-education;Balance training;Therapeutic exercise    PT Goals (Current goals can be found in the Care Plan section)  Acute Rehab PT Goals Patient Stated Goal: Maintain independance PT Goal Formulation: With patient Time For Goal Achievement: 04/30/21 Potential to Achieve Goals: Fair    Frequency Min 3X/week     Co-evaluation               AM-PAC PT "6 Clicks" Mobility  Outcome Measure Help needed turning from your back to your side while in a flat bed without using bedrails?: A Little Help needed moving from lying on your back to sitting on the side of a flat bed without using bedrails?: A Lot Help needed moving to and from a bed to a chair (including a wheelchair)?: A Little Help needed standing up from a chair using your arms (e.g., wheelchair or bedside chair)?: A Little Help needed to walk in hospital room?: A Lot Help needed climbing 3-5 steps with a railing? : Total 6 Click Score: 14    End of Session Equipment Utilized During Treatment: Gait belt Activity Tolerance: Patient tolerated treatment well Patient left: in bed;with restraints reapplied;with bed alarm  set;with call bell/phone within reach Nurse Communication: Mobility status PT Visit Diagnosis: Unsteadiness on feet (R26.81);Other symptoms and signs involving the nervous system (R29.898);Other abnormalities of gait and mobility (R26.89)    Time: 1415-1450 PT Time Calculation (min) (ACUTE ONLY): 35 min   Charges:   PT Evaluation $PT Eval Moderate Complexity: 1 Mod          Quenton Fetter, SPT    Quenton Fetter 04/16/2021, 4:47 PM

## 2021-04-16 NOTE — Progress Notes (Signed)
Pt intermittently snoring.  Due to this, this RN to reassess CIWA in 1 hour to see if medication needs to be administered. Will continue to monitor.   04/16/21 0317  CIWA-Ar  BP (!) 143/93  Pulse Rate 82  Nausea and Vomiting 0  Tactile Disturbances 1  Tremor 3  Auditory Disturbances 0  Paroxysmal Sweats 0  Visual Disturbances 0  Anxiety 1  Headache, Fullness in Head 0  Agitation 2  Orientation and Clouding of Sensorium 4  CIWA-Ar Total 11

## 2021-04-16 NOTE — TOC CAGE-AID Note (Signed)
Transition of Care [facility]) - CAGE-AID Screening   Patient Details  Name: Mindy Young MRN: [id number] Date of Birth: 30-Sep-1945  Transition of Care [facility]) CM/SW Contact:    Gaetano Hawthorne Tarpley-Carter, [place] Phone Number: 04/16/2021, 2:37 PM   Clinical Narrative: Pt participated in [place].  Pt stated she does  smoke cigarettes and drink ETOH.  Pt was offered resources, due to usage of cigarettes and ETOH.    Harvy Riera Tarpley-Carter, MSW, LCSW-A Pronouns:  She/Her/Hers  Transitions of Care Clinical Social Worker Direct Number:  [phone] [email or web address]  CAGE-AID Screening:    Have You Ever Felt You Ought to Cut Down on Your Drinking or Drug Use?: Yes Have People Annoyed You By [employer] Your Drinking Or Drug Use?: Yes Have You Felt Bad Or Guilty About Your Drinking Or Drug Use?: Yes Have You Ever Had a Drink or Used Drugs First Thing In The Morning to Steady Your Nerves or to Get Rid of a Hangover?: No CAGE-AID Score: 3  Substance Abuse Education Offered: Yes  Substance abuse interventions: Scientist, clinical (histocompatibility and immunogenetics)

## 2021-04-17 ENCOUNTER — Inpatient Hospital Stay (HOSPITAL_COMMUNITY): Payer: Medicare Other

## 2021-04-17 DIAGNOSIS — Z515 Encounter for palliative care: Secondary | ICD-10-CM | POA: Diagnosis not present

## 2021-04-17 DIAGNOSIS — S065XAA Traumatic subdural hemorrhage with loss of consciousness status unknown, initial encounter: Secondary | ICD-10-CM | POA: Diagnosis not present

## 2021-04-17 DIAGNOSIS — R404 Transient alteration of awareness: Secondary | ICD-10-CM | POA: Diagnosis not present

## 2021-04-17 DIAGNOSIS — S0181XA Laceration without foreign body of other part of head, initial encounter: Secondary | ICD-10-CM | POA: Diagnosis present

## 2021-04-17 DIAGNOSIS — J449 Chronic obstructive pulmonary disease, unspecified: Secondary | ICD-10-CM | POA: Diagnosis not present

## 2021-04-17 DIAGNOSIS — F1010 Alcohol abuse, uncomplicated: Secondary | ICD-10-CM | POA: Diagnosis not present

## 2021-04-17 LAB — CBC
HCT: 30.2 % — ABNORMAL LOW (ref 36.0–46.0)
Hemoglobin: 10.5 g/dL — ABNORMAL LOW (ref 12.0–15.0)
MCH: 35.7 pg — ABNORMAL HIGH (ref 26.0–34.0)
MCHC: 34.8 g/dL (ref 30.0–36.0)
MCV: 102.7 fL — ABNORMAL HIGH (ref 80.0–100.0)
Platelets: 277 10*3/uL (ref 150–400)
RBC: 2.94 MIL/uL — ABNORMAL LOW (ref 3.87–5.11)
RDW: 13.7 % (ref 11.5–15.5)
WBC: 4.5 10*3/uL (ref 4.0–10.5)
nRBC: 0 % (ref 0.0–0.2)

## 2021-04-17 LAB — BASIC METABOLIC PANEL
Anion gap: 13 (ref 5–15)
BUN: 5 mg/dL — ABNORMAL LOW (ref 8–23)
CO2: 23 mmol/L (ref 22–32)
Calcium: 7.7 mg/dL — ABNORMAL LOW (ref 8.9–10.3)
Chloride: 91 mmol/L — ABNORMAL LOW (ref 98–111)
Creatinine, Ser: 0.64 mg/dL (ref 0.44–1.00)
GFR, Estimated: >60 mL/min (ref >60)
Glucose, Bld: 99 mg/dL (ref 70–99)
Potassium: 3.8 mmol/L (ref 3.5–5.1)
Sodium: 127 mmol/L — ABNORMAL LOW (ref 135–145)

## 2021-04-17 LAB — MAGNESIUM: Magnesium: 1.3 mg/dL — ABNORMAL LOW (ref 1.7–2.4)

## 2021-04-17 MED ORDER — MAGNESIUM SULFATE 4 GM/100ML IV SOLN
4.0000 g | Freq: Once | INTRAVENOUS | Status: AC
  Administered 2021-04-17: 4 g via INTRAVENOUS
  Filled 2021-04-17: qty 100

## 2021-04-17 NOTE — Assessment & Plan Note (Signed)
Subdural hematomas both acute and chronic Probably secondary to falls from alcohol intoxication. Repeat CT ordered and neurosurgery consulted and recommendations given.  No further intervention at this time.  Patient has facial trauma and a facial laceration, has been more than 24 hours.  Dr. Janace Hoard consulted for further evaluation of facial laceration. Therapy evaluations ordered. Recommending home health PT/OT.

## 2021-04-17 NOTE — Hospital Course (Addendum)
Mindy Young is a 76 y.o. Caucasian female with medical history significant for alcohol abuse, anxiety, depression, GERD, seizure disorder and previous SDH, hypertension, tardive dyskinesia and breast cancer status post lumpectomy, who presented to the emergency room with acute onset of recurrent falls.   CT Head revealed acute subdural hemorrhage layering along the left tentorial leaflet that is 3 mm thick and along the right cerebral convexity that is 2 mm thick with no significant mass effect.  Maxillofacial CT which was motion limited without evidence of fracture. C-spine CT showed no evidence for acute fracture or traumatic malalignment.  It showed severe multilevel degenerative change and emphysema.  Repeat CT head showed nnlarging low-attenuation subdural hematoma along the left cerebral convexity resulting in increasing mass effect upon the left cerebral hemisphere and developing 4 mm left-to-right midline shift, stable 3 mm subdural hematoma along the left tentorium. On call physician Dr Cyd Silence discussed with neurosurgery and suggested that these findings are chronic. Recommended outpatient follow up with Dr Reatha Armour in 2 weeks.  Hospital course also complicated by confusion and alcohol withdrawals.  Seen by PT and OT recommended SNF placement.  Patient currently not able to make her own medical decisions per psychiatry and given previous PCP conversations that her next fall could be fatal she is not safe for discharge home.

## 2021-04-17 NOTE — Evaluation (Signed)
Occupational Therapy Evaluation Patient Details Name: Mindy Young MRN: [id number] DOB: March 16, 1945 Today's Date: 04/17/2021   History of Present Illness 76 yo female presented to ED with head trauma resulting from a fall while intoxicated. Pt was found to have Hyponatremia.  Pt has history of falls, at least 3 in the past 2 weeks where she presented to the ED according to he chart. PMHx:  alcohol abuse, anxiety, depression, GERD, seizure disorder and previous SDH, hypertension, tardive dyskinesia, and breast cancer status post lumpectomy, UTI,   Clinical Impression   Mindy Young was evaluated s/p the above fall with head trauma. She reports having a 24/7 PCG who assists with ADLs at home, and uses [facility] or RW for mobility and reports several falls. Attempt to call pt's PCG "[name]," with no answer. Pt required cues for bed mobility due to impulsivity and up to min A for mobility and ADLs. Pt is limited by impaired cognition, poor insight, poor safety awareness, and impaired balance. Pt will benefit from OT acutely to address the limitations listed below. Recommend SNF at this time due to frequent falls and safety, however if pt can confirm 24/7 direct physical assist at home, she may be appropriate to d/c home with [facility] services. This date, pt declining SNF.     Recommendations for follow up therapy are one component of a multi-disciplinary discharge planning process, led by the attending physician.  Recommendations may be updated based on patient status, additional functional criteria and insurance authorization.   Follow Up Recommendations  Home health OT    Assistance Recommended at Discharge Frequent or constant Supervision/Assistance  Patient can return home with the following A little help with walking and/or transfers;A little help with bathing/dressing/bathroom;Assist for transportation;Help with stairs or ramp for entrance;Assistance with cooking/housework    Functional Status Assessment  Patient  has had a recent decline in their functional status and demonstrates the ability to make significant improvements in function in a reasonable and predictable amount of time.  Equipment Recommendations  None recommended by OT    Recommendations for Other Services       Precautions / Restrictions Precautions Precautions: Fall Precaution Comments: posey belt in bed Restrictions Weight Bearing Restrictions: No      Mobility Bed Mobility Overal bed mobility: Needs Assistance Bed Mobility: Supine to Sit, Sit to Supine     Supine to sit: Supervision Sit to supine: Supervision   General bed mobility comments: incr time and effort but no physical assist    Transfers Overall transfer level: Needs assistance Equipment used: Rolling walker (2 wheels) Transfers: Sit to/from Stand Sit to Stand: Min guard           General transfer comment: impulsive      Balance Overall balance assessment: Needs assistance Sitting-balance support: Feet supported Sitting balance-Leahy Scale: Fair     Standing balance support: Single extremity supported, During functional activity Standing balance-Leahy Scale: Fair                             ADL either performed or assessed with clinical judgement   ADL Overall ADL's : Needs assistance/impaired Eating/Feeding: Independent;Sitting   Grooming: Min guard;Standing   Upper Body Bathing: Supervision/ safety;Sitting   Lower Body Bathing: Minimal assistance;Sit to/from stand   Upper Body Dressing : Supervision/safety;Sitting   Lower Body Dressing: Minimal assistance;Sit to/from stand   Toilet Transfer: Minimal assistance;Rolling walker (2 wheels);Ambulation   Toileting- Clothing Manipulation and Hygiene: Supervision/safety;Sitting/lateral  lean       Functional mobility during ADLs: Minimal assistance;Rolling walker (2 wheels) General ADL Comments: assist for impaired cognition, impulsivity, impaired balance and poor  activity tolerance     Vision Baseline Vision/History: 0 No visual deficits Ability to See in Adequate Light: 0 Adequate Patient Visual Report: No change from baseline Vision Assessment?: Vision impaired- to be further tested in functional context Additional Comments: possibly some inattention to the L            Pertinent Vitals/Pain Pain Assessment Pain Assessment: Faces Faces Pain Scale: Hurts a little bit Pain Location: generalized with movement Pain Descriptors / Indicators: Discomfort Pain Intervention(s): Limited activity within patient's tolerance, Monitored during session     Hand Dominance Right   Extremity/Trunk Assessment Upper Extremity Assessment Upper Extremity Assessment: LUE deficits/detail LUE Deficits / Details: ROM WFL, MMT generally 4/5, slow and deliberate coordiantion testing LUE Coordination: decreased fine motor   Lower Extremity Assessment Lower Extremity Assessment: Defer to PT evaluation   Cervical / Trunk Assessment Cervical / Trunk Assessment: Normal   Communication Communication Communication: No difficulties   Cognition Arousal/Alertness: Awake/alert Behavior During Therapy: Flat affect Overall Cognitive Status: No family/caregiver present to determine baseline cognitive functioning Area of Impairment: Orientation, Attention, Memory, Following commands, Safety/judgement, Awareness, Problem solving                 Orientation Level: Disoriented to, Time Current Attention Level: Selective Memory: Decreased short-term memory Following Commands: Follows one step commands inconsistently, Follows one step commands with increased time Safety/Judgement: Decreased awareness of safety, Decreased awareness of deficits Awareness: Emergent Problem Solving: Slow processing, Decreased initiation, Requires verbal cues General Comments: Followed most commands, required repetivite cues at times. Impulsive with OOB movements, impulsive and limited  insight to safety or deficits     General Comments  VSS on RA     Home Living Family/patient expects to be discharged to:: Private residence Living Arrangements: Alone Available Help at Discharge: Personal care attendant;Available 24 hours/day Type of Home: House Home Access: Stairs to enter [employer] of Steps: 6 Entrance Stairs-Rails: Right Home Layout: One level               Home Equipment: BSC/3in1;Shower seat;Wheelchair - Publishing copy (2 wheels);Cane - single point   Additional Comments: pt reports she had an injury and had all the equipment from then,      Prior Functioning/Environment Prior Level of Function : Needs assist           ADLs (physical): Bathing;Dressing;IADLs Mobility Comments: Reports mulitple falls, cannot recall why or how she is falling. Has RW and single point cane for mobility ADLs Comments: States that her PCA assists with shower transfer, dressing, meal preparation, med management        OT Problem List: Decreased strength;Decreased range of motion;Decreased activity tolerance;Impaired balance (sitting and/or standing);Decreased safety awareness;Decreased knowledge of use of DME or AE;Decreased knowledge of precautions      OT Treatment/Interventions: Self-care/ADL training;Therapeutic exercise;DME and/or AE instruction;Patient/family education;Balance training    OT Goals(Current goals can be found in the care plan section) Acute Rehab OT Goals Patient Stated Goal: home OT Goal Formulation: With patient Time For Goal Achievement: 05/01/21 Potential to Achieve Goals: Good ADL Goals Pt Will Perform Grooming: with modified independence;standing Pt Will Perform Upper Body Dressing: sitting;Independently Pt Will Perform Lower Body Dressing: with modified independence;sit to/from stand Pt Will Transfer to Toilet: with modified independence;ambulating Additional ADL Goal #1: Pt will indep recall at  least 3 fall  prevention strategies to apply to the home setting  OT Frequency: Min 2X/week       AM-PAC OT "6 Clicks" Daily Activity     Outcome Measure Help from another person eating meals?: None Help from another person taking care of personal grooming?: A Little Help from another person toileting, which includes using toliet, bedpan, or urinal?: A Little Help from another person bathing (including washing, rinsing, drying)?: A Little Help from another person to put on and taking off regular upper body clothing?: A Little Help from another person to put on and taking off regular lower body clothing?: A Little 6 Click Score: 19   End of Session Equipment Utilized During Treatment: Gait belt;Rolling walker (2 wheels) Nurse Communication: Mobility status  Activity Tolerance: Patient tolerated treatment well Patient left: in bed;with call bell/phone within reach;with bed alarm set;with restraints reapplied  OT Visit Diagnosis: Other abnormalities of gait and mobility (R26.89);Unsteadiness on feet (R26.81);Repeated falls (R29.6);Muscle weakness (generalized) (M62.81);History of falling (Z91.81)                Time: 4492-0100 OT Time Calculation (min): 29 min Charges:  OT General Charges $OT Visit: 1 Visit OT Evaluation $OT Eval Moderate Complexity: 1 Mod OT Treatments $Therapeutic Activity: 8-22 mins   Saren Corkern A Mcadoo Muzquiz 04/17/2021, 1:56 PM

## 2021-04-17 NOTE — Assessment & Plan Note (Signed)
Persistent and chronic.  Secondary to chronic alcohol abuse.  Improved with IV fluids.  Monitor.

## 2021-04-17 NOTE — Progress Notes (Signed)
[facility]                                                                               Mindy Young, is a 76 y.o. female, DOB - [birth year], YHC:[id number] Admit date - 04/15/2021    Outpatient Primary MD for the patient is Loura Pardon, MD  LOS - 2  days    Brief summary   RILYNNE Young is a 76 y.o. Caucasian female with medical history significant for alcohol abuse, anxiety, depression, GERD, seizure disorder and previous SDH, hypertension, tardive dyskinesia and breast cancer status post lumpectomy, who presented to the emergency room with acute onset of recurrent falls.   Head CT scan revealed acute subdural hemorrhage layering along the left tentorial leaflet that is 3 mm thick and along the right cerebral convexity that is 2 mm thick with no significant mass effect.  It showed right posterior scalp contusion without acute fracture and chronic microvascular disease as well as cerebral atrophy. Maxillofacial CT which was motion limited without evidence of fracture. C-spine CT showed no evidence for acute fracture or traumatic malalignment.  It showed severe multilevel degenerative change and emphysema.       Assessment & Plan    Assessment and Plan: * Subdural hematoma- (present on admission) Subdural hematomas both acute and chronic Probably secondary to falls from alcohol intoxication. Repeat CT ordered and neurosurgery consulted and recommendations given.  No further intervention at this time.  Patient has facial trauma and a facial laceration, has been more than 24 hours.  Dr. Janace Hoard consulted for further evaluation of facial laceration. Therapy evaluations ordered. Recommending home health PT/OT.    Face lacerations- (present on admission) Right old face laceration.  ENT consulted for further evaluation.  Clean with hydrogen peroxide once per day and saline gauze to the wound twice per day.  Added ancef for wound coverage.   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  On alcohol withdrawal protocol.    Hypomagnesemia- (present on admission) replaced and repeat in am.   Seizure ([place]) Resume home meds  Hyponatremia- (present on admission) Persistent and chronic.  Secondary to chronic alcohol abuse.  Improved with IV fluids.  Monitor.   COPD (chronic obstructive pulmonary disease) ([place])- (present on admission) No wheezing heard on exam.   GERD (gastroesophageal reflux disease)- (present on admission) Stable.   Anemia- (present on admission) Macrocytic,. Hemoglobin stable around 10.  Continue with vitamin b12 supplementation.       Code Status: full code.  DVT Prophylaxis:  SCDs Start: 04/15/21 1935   Level of Care: Level of care: Progressive Family Communication: none at bedside.   Disposition Plan:     Remains inpatient appropriate:  Persistent hyponatremia. IV antibiotics, alcohol withdrawal.   Procedures:  None.   Consultants:   ENT PALLIATIVE CARE  Antimicrobials:   Anti-infectives (From admission, onward)   Start     Dose/Rate Route Frequency Ordered Stop   04/16/21 1630  ceFAZolin (ANCEF) IVPB 1 g/50 mL premix        1 g 100 mL/hr over 30 Minutes Intravenous Every 8  hours 04/16/21 1538         Medications  Scheduled Meds:  folic acid  1 mg Oral Daily   levETIRAcetam  1,000 mg Oral BID   melatonin  10 mg Oral QHS   metoprolol succinate  50 mg Oral Daily   multivitamin with minerals  1 tablet Oral Daily   oxybutynin  10 mg Oral q morning   PHENobarbital  32.4 mg Oral BID   sodium chloride flush  10-40 mL Intracatheter Q12H   thiamine  100 mg Oral Daily   Or   thiamine  100 mg Intravenous Daily   vitamin B-12  3,000 mcg Oral q morning   Continuous Infusions:  sodium chloride Stopped (04/15/21 2200)   sodium chloride Stopped (04/16/21 2154)    ceFAZolin (ANCEF) IV  1 g (04/17/21 1453)   PRN Meds:.acetaminophen **OR** acetaminophen, albuterol, diphenoxylate-atropine, LORazepam, LORazepam **OR** LORazepam, magnesium hydroxide, sodium chloride flush, traZODone    Subjective:   Daryana Whirley was seen and examined today. No chest pain or sob.  Sleepy. Reports being sore all the time.   Objective:   Vitals:   04/17/21 0410 04/17/21 0801 04/17/21 1000 04/17/21 1138  BP: 135/80 (!) 141/90  (!) 144/89  Pulse: 72 69 82 73  Resp: 18 20  18   Temp: 97.7 F (36.5 C) 97.6 F (36.4 C)  (!) 97.4 F (36.3 C)  TempSrc: Oral Axillary  Oral  SpO2: 97% 97%  100%  Weight:      Height:        Intake/Output Summary (Last 24 hours) at 04/17/2021 1506 Last data filed at 04/17/2021 1300 Gross per 24 hour  Intake 574.1 ml  Output 1700 ml  Net -1125.9 ml   Filed Weights   04/15/21 1451  Weight: 61.2 kg     Exam General exam: Appears calm and comfortable  Respiratory system: Clear to auscultation. Respiratory effort normal. Cardiovascular system: S1 & S2 heard, RRR. No JVD,  No pedal edema. Gastrointestinal system: Abdomen is nondistended, soft and nontender Normal bowel sounds heard. Central nervous system: lethargic, but wakes up to answer simple questions.  Extremities: no pedal edema.  Skin: bruising of the upper and lower extremities.   Psychiatry: mood is appropriate.     Data Reviewed:  I have personally reviewed following labs and imaging studies   CBC Lab Results  Component Value Date   WBC 4.5 04/17/2021   RBC 2.94 (L) 04/17/2021   HGB 10.5 (L) 04/17/2021   HCT 30.2 (L) 04/17/2021   MCV 102.7 (H) 04/17/2021   MCH 35.7 (H) 04/17/2021   PLT 277 04/17/2021   MCHC 34.8 04/17/2021   RDW 13.7 04/17/2021   LYMPHSABS 0.8 04/15/2021   MONOABS 1.0 04/15/2021   EOSABS 0.0 04/15/2021   BASOSABS 0.0 17/79/3903     Last metabolic panel Lab Results  Component Value Date   NA 127 (L) 04/17/2021   K 3.8 04/17/2021   CL 91 (L) 04/17/2021    CO2 23 04/17/2021   BUN 5 (L) 04/17/2021   CREATININE 0.64 04/17/2021   GLUCOSE 99 04/17/2021   GFRNONAA >60 04/17/2021   GFRAA >60 12/13/2018   CALCIUM 7.7 (L) 04/17/2021   PHOS 3.6 04/16/2021   PROT 5.1 (L) 04/15/2021   ALBUMIN 3.0 (L) 04/15/2021   BILITOT 0.6 04/15/2021   ALKPHOS 75 04/15/2021   AST 48 (H) 04/15/2021   ALT 40 04/15/2021   ANIONGAP 13 04/17/2021    CBG (last 3)  Recent Labs  04/15/21 2014  GLUCAP 80      Coagulation Profile: No results for input(s): INR, PROTIME in the last 168 hours.   Radiology Studies: CT Head Wo Contrast  Result Date: 04/15/2021 CLINICAL DATA:  Follow-up subdural hematoma. EXAM: CT HEAD WITHOUT CONTRAST TECHNIQUE: Contiguous axial images were obtained from the base of the skull through the vertex without intravenous contrast. RADIATION DOSE REDUCTION: This exam was performed according to the departmental dose-optimization program which includes automated exposure control, adjustment of the mA and/or kV according to patient size and/or use of iterative reconstruction technique. COMPARISON:  5 hours prior FINDINGS: Brain: Unchanged acute subdural hemorrhage layering along the left tentorium, measuring up to 3 mm in thickness, series 5, image 43. There is slightly decreasing density suggesting some interval dispersion. This thin, 2 mm, subdural hemorrhage along the right frontotemporal convexity is not significantly changed, for example series 5, image 31. there may be a thin, 2-3 mm a isodense subdural collection overlying the left frontoparietal convexity, series 5, image 44 and series 3, image 21. There is no significant mass effect or midline shift from any of these subdural hemorrhages. Stable degree of atrophy and chronic small vessel ischemia. No acute infarct. Vascular: Skull base atherosclerosis.  No hyperdense vessel Skull: No skull fracture. Sinuses/Orbits: Assessed earlier on face CT. Other: Right parietal scalp hematoma.  IMPRESSION: 1. Unchanged acute subdural hemorrhage layering along the left tentorium, measuring up to 3 mm in thickness. There is slightly decreasing density suggesting some interval dispersion. 2. Unchanged thin, 2 mm, subdural hemorrhage along the right frontotemporal convexity. 3. Possible thin, 2-3 mm isodense subdural collection overlying the left frontoparietal convexity, not seen previously. 4. No significant mass effect or midline shift from any of these subdural hemorrhages. 5. Stable atrophy and chronic small vessel ischemia. Electronically Signed   By: Keith Rake M.D.   On: 04/15/2021 21:10   CT Head Wo Contrast  Result Date: 04/15/2021 CLINICAL DATA:  Head trauma, minor (Age >= 65y); Neck trauma (Age >= 65y); Facial trauma, blunt EXAM: CT HEAD WITHOUT CONTRAST CT MAXILLOFACIAL WITHOUT CONTRAST CT CERVICAL SPINE WITHOUT CONTRAST TECHNIQUE: Multidetector CT imaging of the head, cervical spine, and maxillofacial structures were performed using the standard protocol without intravenous contrast. Multiplanar CT image reconstructions of the cervical spine and maxillofacial structures were also generated. RADIATION DOSE REDUCTION: This exam was performed according to the departmental dose-optimization program which includes automated exposure control, adjustment of the mA and/or kV according to patient size and/or use of iterative reconstruction technique. COMPARISON:  April 15, 2021 FINDINGS: CT HEAD FINDINGS Brain: Acute subdural hemorrhage layering along the left tentorial leaflet, measuring up to 3 mm in thickness. Thin (2 mm thick) acute subdural hemorrhage along the right frontotemporal convexity. No evidence of acute large vascular territory infarct. Moderate patchy and confluent white matter hypoattenuation, nonspecific but compatible with chronic microvascular ischemic disease. No midline shift, mass lesion, or hydrocephalus. Cerebral atrophy with ex vacuo ventricular dilation. Vascular:  Calcific atherosclerosis. Skull: Right posterior scalp contusion without acute fracture. Other: No mastoid effusions. CT MAXILLOFACIAL FINDINGS Mildly motion limited. Osseous: No fracture or mandibular dislocation. No destructive process. Orbits: Negative. No traumatic or inflammatory finding. Sinuses: Mucosal thickening of the inferior right maxillary sinus. Fluid layering in a small left sphenoid sinus. Otherwise, clear sinuses. Soft tissues: Negative. CT CERVICAL SPINE FINDINGS Alignment: Similar alignment, including mild anterolisthesis of C3 on C4, C4 on C5 and C7 on T1. broad levocurvature. Skull base and vertebrae: No evidence of acute fracture.  Stable bony fusion across the C4-C5 disc space with similar anterior height loss and focal kyphosis at this level. No new vertebral body height loss. Soft tissues and spinal canal: No prevertebral fluid or swelling. No visible canal hematoma. Disc levels: Stable severe degenerative disc disease at C5-C6 and C6-C7. Similar severe multilevel facet arthropathy and severe craniocervical degenerative change. Upper chest: Emphysema with similar scarring in the lung apices. 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 IMPRESSION: CT head: 1. Acute subdural hemorrhage layering along the left tentorial leaflet (3 mm thick) and along the right cerebral convexity (2 mm thick). No significant mass effect. 2. Right posterior scalp contusion without acute fracture. 3. 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 CT maxillofacial: Motion limited without evidence of acute fracture. CT cervical spine: 1. No evidence of acute fracture or traumatic malalignment. 2. Similar severe multilevel degenerative change. 3.  Emphysema (ICD10-J43.9). Findings discussed with provider Norval Gable, [place] via telephone at 4:52 PM. Electronically Signed   By: Margaretha Sheffield M.D.   On: 04/15/2021 16:54   CT Cervical Spine Wo Contrast  Result Date: 04/15/2021 CLINICAL DATA:  Head  trauma, minor (Age >= 65y); Neck trauma (Age >= 65y); Facial trauma, blunt EXAM: CT HEAD WITHOUT CONTRAST CT MAXILLOFACIAL WITHOUT CONTRAST CT CERVICAL SPINE WITHOUT CONTRAST TECHNIQUE: Multidetector CT imaging of the head, cervical spine, and maxillofacial structures were performed using the standard protocol without intravenous contrast. Multiplanar CT image reconstructions of the cervical spine and maxillofacial structures were also generated. RADIATION DOSE REDUCTION: This exam was performed according to the departmental dose-optimization program which includes automated exposure control, adjustment of the mA and/or kV according to patient size and/or use of iterative reconstruction technique. COMPARISON:  April 15, 2021 FINDINGS: CT HEAD FINDINGS Brain: Acute subdural hemorrhage layering along the left tentorial leaflet, measuring up to 3 mm in thickness. Thin (2 mm thick) acute subdural hemorrhage along the right frontotemporal convexity. No evidence of acute large vascular territory infarct. Moderate patchy and confluent white matter hypoattenuation, nonspecific but compatible with chronic microvascular ischemic disease. No midline shift, mass lesion, or hydrocephalus. Cerebral atrophy with ex vacuo ventricular dilation. Vascular: Calcific atherosclerosis. Skull: Right posterior scalp contusion without acute fracture. Other: No mastoid effusions. CT MAXILLOFACIAL FINDINGS Mildly motion limited. Osseous: No fracture or mandibular dislocation. No destructive process. Orbits: Negative. No traumatic or inflammatory finding. Sinuses: Mucosal thickening of the inferior right maxillary sinus. Fluid layering in a small left sphenoid sinus. Otherwise, clear sinuses. Soft tissues: Negative. CT CERVICAL SPINE FINDINGS Alignment: Similar alignment, including mild anterolisthesis of C3 on C4, C4 on C5 and C7 on T1. broad levocurvature. Skull base and vertebrae: No evidence of acute fracture. Stable bony fusion across  the C4-C5 disc space with similar anterior height loss and focal kyphosis at this level. No new vertebral body height loss. Soft tissues and spinal canal: No prevertebral fluid or swelling. No visible canal hematoma. Disc levels: Stable severe degenerative disc disease at C5-C6 and C6-C7. Similar severe multilevel facet arthropathy and severe craniocervical degenerative change. Upper chest: Emphysema with similar scarring in the lung apices. 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 IMPRESSION: CT head: 1. Acute subdural hemorrhage layering along the left tentorial leaflet (3 mm thick) and along the right cerebral convexity (2 mm thick). No significant mass effect. 2. Right posterior scalp contusion without acute fracture. 3. 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 CT maxillofacial: Motion limited without evidence of acute fracture. CT cervical spine: 1. No evidence of acute fracture or traumatic  malalignment. 2. Similar severe multilevel degenerative change. 3.  Emphysema (ICD10-J43.9). Findings discussed with provider Norval Gable, [place] via telephone at 4:52 PM. Electronically Signed   By: Margaretha Sheffield M.D.   On: 04/15/2021 16:54   CT CHEST ABDOMEN PELVIS W CONTRAST  Result Date: 04/15/2021 CLINICAL DATA:  Trauma, fall EXAM: CT CHEST, ABDOMEN, AND PELVIS WITH CONTRAST TECHNIQUE: Multidetector CT imaging of the chest, abdomen and pelvis was performed following the standard protocol during bolus administration of intravenous contrast. RADIATION DOSE REDUCTION: This exam was performed according to the departmental dose-optimization program which includes automated exposure control, adjustment of the mA and/or kV according to patient size and/or use of iterative reconstruction technique. CONTRAST:  180mL OMNIPAQUE IOHEXOL 300 MG/ML  SOLN COMPARISON:  04/07/2021 FINDINGS: CT CHEST FINDINGS Cardiovascular: There is homogeneous enhancement in thoracic aorta. There are no filling defects  in central pulmonary artery branches. Mediastinum/Nodes: There is no evidence of mediastinal hematoma. There is no significant lymphadenopathy. Lungs/Pleura: Severe centrilobular and panlobular emphysema is seen. There are patchy infiltrates in the right middle lobe and right lower lobe with interval worsening of infiltrate in the right lower lobe. Small right pleural effusion is seen. Linear densities seen in both apices, more so on the right side with no significant interval change. There is no pneumothorax. Musculoskeletal: No displaced fractures are seen. CT ABDOMEN PELVIS FINDINGS Hepatobiliary: Liver measures 14.7 cm. There is fatty infiltration. There are few low-density lesions in the liver largest in the left lobe measuring approximately 1.4 cm. Gallbladder is not seen. There is no significant dilation of bile ducts. Pancreas: No focal abnormality is seen. Spleen: Unremarkable. Adrenals/Urinary Tract: Adrenals are unremarkable. There is no demonstrable cortical laceration. There is no hydronephrosis. Ureters are not dilated. Urinary bladder is unremarkable. Stomach/Bowel: Stomach is unremarkable. Small bowel loops are not dilated. Appendix is not distinctly seen. There is no pericecal inflammation. There is no significant wall thickening in colon. There is no pericolic stranding. Vascular/Lymphatic: There are scattered atherosclerotic plaques and calcifications in the aorta and its major branches. Reproductive: Uterus is not seen. Other: There is no ascites or pneumoperitoneum. Small umbilical hernia containing fat is seen. Possible small right inguinal hernia containing fat is seen. Augmentation/reconstruction prostheses are seen in both breasts. Musculoskeletal: No displaced fractures are seen. Degenerative changes are noted in the lower lumbar spine with encroachment of neural foramina. There is first-degree spondylolisthesis and spinal stenosis at L4-L5 level. IMPRESSION: There is no evidence of  mediastinal or retroperitoneal hematoma. There are linear patchy infiltrates in the right middle lobe and right lower lobe with interval worsening in the right lower lobe suggesting atelectasis/pneumonia. There is interval appearance of small right pleural effusion. There is no demonstrable laceration in solid organs. There is no ascites or pneumoperitoneum. Fatty liver. There are low-density lesions in the liver suggesting cysts or hemangiomas or some other space-occupying neoplastic process. Severe centrilobular and panlobular emphysema. Lumbar spondylosis with spinal stenosis at L4-L5 level. Other findings as described in the body of the report. Electronically Signed   By: Elmer Picker M.D.   On: 04/15/2021 16:50   DG Humerus Left  Result Date: 04/15/2021 CLINICAL DATA:  Fall EXAM: LEFT HUMERUS - 2+ VIEW COMPARISON:  None. FINDINGS: There is no evidence of fracture or other focal bone lesions. Soft tissues are unremarkable. IMPRESSION: Negative. Electronically Signed   By: Franchot Gallo M.D.   On: 04/15/2021 15:44   DG Humerus Right  Result Date: 04/15/2021 CLINICAL DATA:  Fall EXAM: RIGHT HUMERUS -  2+ VIEW COMPARISON:  None. FINDINGS: There is no evidence of fracture or other focal bone lesions. Soft tissues are unremarkable. IMPRESSION: Negative. Electronically Signed   By: Franchot Gallo M.D.   On: 04/15/2021 15:43   CT US GUIDE VASC ACCESS LT NO REPORT  Result Date: 04/15/2021 There is no Radiologist interpretation  for this exam.  CT Maxillofacial WO CM  Result Date: 04/15/2021 CLINICAL DATA:  Head trauma, minor (Age >= 65y); Neck trauma (Age >= 65y); Facial trauma, blunt EXAM: CT HEAD WITHOUT CONTRAST CT MAXILLOFACIAL WITHOUT CONTRAST CT CERVICAL SPINE WITHOUT CONTRAST TECHNIQUE: Multidetector CT imaging of the head, cervical spine, and maxillofacial structures were performed using the standard protocol without intravenous contrast. Multiplanar CT image reconstructions of the  cervical spine and maxillofacial structures were also generated. RADIATION DOSE REDUCTION: This exam was performed according to the departmental dose-optimization program which includes automated exposure control, adjustment of the mA and/or kV according to patient size and/or use of iterative reconstruction technique. COMPARISON:  April 15, 2021 FINDINGS: CT HEAD FINDINGS Brain: Acute subdural hemorrhage layering along the left tentorial leaflet, measuring up to 3 mm in thickness. Thin (2 mm thick) acute subdural hemorrhage along the right frontotemporal convexity. No evidence of acute large vascular territory infarct. Moderate patchy and confluent white matter hypoattenuation, nonspecific but compatible with chronic microvascular ischemic disease. No midline shift, mass lesion, or hydrocephalus. Cerebral atrophy with ex vacuo ventricular dilation. Vascular: Calcific atherosclerosis. Skull: Right posterior scalp contusion without acute fracture. Other: No mastoid effusions. CT MAXILLOFACIAL FINDINGS Mildly motion limited. Osseous: No fracture or mandibular dislocation. No destructive process. Orbits: Negative. No traumatic or inflammatory finding. Sinuses: Mucosal thickening of the inferior right maxillary sinus. Fluid layering in a small left sphenoid sinus. Otherwise, clear sinuses. Soft tissues: Negative. CT CERVICAL SPINE FINDINGS Alignment: Similar alignment, including mild anterolisthesis of C3 on C4, C4 on C5 and C7 on T1. broad levocurvature. Skull base and vertebrae: No evidence of acute fracture. Stable bony fusion across the C4-C5 disc space with similar anterior height loss and focal kyphosis at this level. No new vertebral body height loss. Soft tissues and spinal canal: No prevertebral fluid or swelling. No visible canal hematoma. Disc levels: Stable severe degenerative disc disease at C5-C6 and C6-C7. Similar severe multilevel facet arthropathy and severe craniocervical degenerative change. Upper  chest: Emphysema with similar scarring in the lung apices. 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 IMPRESSION: CT head: 1. Acute subdural hemorrhage layering along the left tentorial leaflet (3 mm thick) and along the right cerebral convexity (2 mm thick). No significant mass effect. 2. Right posterior scalp contusion without acute fracture. 3. 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 CT maxillofacial: Motion limited without evidence of acute fracture. CT cervical spine: 1. No evidence of acute fracture or traumatic malalignment. 2. Similar severe multilevel degenerative change. 3.  Emphysema (ICD10-J43.9). Findings discussed with provider Norval Gable, [place] via telephone at 4:52 PM. Electronically Signed   By: Margaretha Sheffield M.D.   On: 04/15/2021 16:54       Hosie Poisson M.D. [facility] 04/17/2021, 3:06 PM  Available via Epic secure chat 7am-7pm After 7 pm, please refer to night coverage provider listed on amion.

## 2021-04-17 NOTE — Progress Notes (Signed)
Patient ID: AIRYANA SPRUNGER, female   DOB: 1945-10-27, 76 y.o.   MRN: [id number]    Progress Note from the Palliative Medicine Team at [facility]   Patient Name: Mindy Young        Date: 04/17/2021 DOB: 1945-11-05  Age: 76 y.o. MRN#: [id number] Attending Physician: Hosie Poisson, MD Primary Care Physician: Loura Pardon, MD Admit Date: 04/15/2021   Medical records reviewed   76 y.o. female  admitted on 04/15/2021 with medical history significant for alcohol abuse, anxiety, depression, GERD, seizure disorder and previous SDH, hypertension, tardive dyskinesia and breast cancer status post lumpectomy, who presented to the emergency room with acute onset of recurrent falls.     She had a fall 3 days ago with subsequent right facial wound      When she came to the ER, vital signs were within normal.  Labs revealed significant hyponatremia 120 and hypochloremia of 80 compared to 128 and 90 on 2/15, with a calcium of 8 and albumin of 3, total protein of 5.1 and magnesium level of 1 with a potassium of 3.6.  CBC showed anemia with hemoglobin of 10.1 and hematocrit 29.7 close to previous levels with macrocytosis.  Her blood glucose was 60 and alcohol abuse 62.   Imaging: Head CT scan revealed acute subdural hemorrhage layering along the left tentorial leaflet that is 3 mm thick and along the right cerebral convexity that is 2 mm thick with no significant mass effect.  It showed right posterior scalp contusion without acute fracture and chronic microvascular disease as well as cerebral atrophy.  Maxillofacial CT which was motion limited without evidence of fracture.  C-spine CT showed no evidence for acute fracture or traumatic malalignment.  It showed severe multilevel degenerative change and emphysema.   Admitted for treatment stabilization.  Unfortunately patient remains confused and intermittently agitated.     This NP visited patient at the bedside as a follow up for palliative medicine  needs and emotional support.  Patient is confused and without medical decision-making capacity at the current time.  I was able to speak to her daughter Cherokee Boccio who lives in [place].  She tells the same story as patient's cousin Erskine Squibb and caregiver Dorcas Mcmurray.  Patient has had a long history with alcohol use/abuse disorder.  Unfortunately she has had many many attempts at rehabilitation without success.  Family expressed concern and frustration with their inability "to help"  In an attempt to secure some kind of advance care planning patient's daughter will contact the lawyer on Monday to see if there are any documents on file.  Education offered on capacity versus competency as it relates to guardianship.   Plan is to give the situation a little more time to see if patient clears cognitively.  Daughter is encouraged to call with questions or concerns  PMT will continue to support holistically  Total time spent on the unit was 50 minutes   Greater than 50% of the time was spent in counseling and coordination of care  Wadie Lessen NP  Palliative Medicine Team Team Phone # [phone] Pager [phone]

## 2021-04-17 NOTE — Assessment & Plan Note (Signed)
Stable

## 2021-04-17 NOTE — Assessment & Plan Note (Signed)
Persistent and is the reason for recurrent admissions.  On alcohol withdrawal protocol.

## 2021-04-17 NOTE — Assessment & Plan Note (Signed)
Resume home meds ?

## 2021-04-17 NOTE — Progress Notes (Addendum)
HOSPITAL MEDICINE OVERNIGHT EVENT NOTE    Notified about patient's noncontrast CT imaging results this evening.  It turns out that the patient had a repeat noncontrast CT ordered for the morning of 2/24 by the admitting provider.  For reasons unclear, this was delayed until approximately 7:30 PM this evening before it was performed.  CT imaging reveals radiology interpretation of the CT mentions an enlarging low-attenuation subdural hematoma along the left cerebral convexity resulting increasing mass effect upon the left cerebral hemisphere and developing 4 mm left-to-right midline shift.  I promptly went to go evaluate the patient at the bedside.  While patient is lethargic, she is oriented x3 and has had no change in mentation according to nursing.  Per my neurologic exam at the bedside patient is completely neurologically intact.  I then spoke to Dr. Kathyrn Sheriff with neurosurgery who reviewed the CT images with me.  He disagrees with the radiologist interpretation and feels that many of the findings mentioned are more chronic.  He states that if there is no neurologic change then no further CT imaging during this hospitalization is warranted.  He recommends outpatient follow-up with Dr. Reatha Armour in 2 weeks and at that time a repeat noncontrast CT scan will likely be performed.  He has specifically recommended no further CT imaging be performed in this hospitalization unless there is a neurologic change.  Continue to monitor patient closely throughout the hospitalization.  Vernelle Emerald  MD [facility]

## 2021-04-17 NOTE — Plan of Care (Signed)
  Problem: Clinical Measurements: Goal: Ability to maintain clinical measurements within normal limits will improve Outcome: Progressing   

## 2021-04-17 NOTE — Assessment & Plan Note (Signed)
replaced and repeat in am.

## 2021-04-17 NOTE — Assessment & Plan Note (Signed)
Macrocytic,. Hemoglobin stable around 10.  Continue with vitamin b12 supplementation.

## 2021-04-17 NOTE — Assessment & Plan Note (Signed)
No wheezing heard on exam.

## 2021-04-17 NOTE — Assessment & Plan Note (Signed)
Right old face laceration.  ENT consulted for further evaluation.  Clean with hydrogen peroxide once per day and saline gauze to the wound twice per day.  Added ancef for wound coverage.

## 2021-04-18 ENCOUNTER — Inpatient Hospital Stay (HOSPITAL_COMMUNITY): Payer: Medicare Other

## 2021-04-18 DIAGNOSIS — F1010 Alcohol abuse, uncomplicated: Secondary | ICD-10-CM | POA: Diagnosis not present

## 2021-04-18 DIAGNOSIS — J449 Chronic obstructive pulmonary disease, unspecified: Secondary | ICD-10-CM | POA: Diagnosis not present

## 2021-04-18 DIAGNOSIS — S065XAA Traumatic subdural hemorrhage with loss of consciousness status unknown, initial encounter: Secondary | ICD-10-CM | POA: Diagnosis not present

## 2021-04-18 LAB — BASIC METABOLIC PANEL
Anion gap: 12 (ref 5–15)
BUN: 5 mg/dL — ABNORMAL LOW (ref 8–23)
CO2: 23 mmol/L (ref 22–32)
Calcium: 8.2 mg/dL — ABNORMAL LOW (ref 8.9–10.3)
Chloride: 92 mmol/L — ABNORMAL LOW (ref 98–111)
Creatinine, Ser: 0.64 mg/dL (ref 0.44–1.00)
GFR, Estimated: >60 mL/min (ref >60)
Glucose, Bld: 130 mg/dL — ABNORMAL HIGH (ref 70–99)
Potassium: 3.5 mmol/L (ref 3.5–5.1)
Sodium: 127 mmol/L — ABNORMAL LOW (ref 135–145)

## 2021-04-18 LAB — MAGNESIUM: Magnesium: 1.7 mg/dL (ref 1.7–2.4)

## 2021-04-18 LAB — CBC
HCT: 28.7 % — ABNORMAL LOW (ref 36.0–46.0)
Hemoglobin: 10 g/dL — ABNORMAL LOW (ref 12.0–15.0)
MCH: 36.1 pg — ABNORMAL HIGH (ref 26.0–34.0)
MCHC: 34.8 g/dL (ref 30.0–36.0)
MCV: 103.6 fL — ABNORMAL HIGH (ref 80.0–100.0)
Platelets: 271 10*3/uL (ref 150–400)
RBC: 2.77 MIL/uL — ABNORMAL LOW (ref 3.87–5.11)
RDW: 13.3 % (ref 11.5–15.5)
WBC: 5.3 10*3/uL (ref 4.0–10.5)
nRBC: 0 % (ref 0.0–0.2)

## 2021-04-18 MED ORDER — NICOTINE 14 MG/24HR TD PT24
14.0000 mg | MEDICATED_PATCH | Freq: Every day | TRANSDERMAL | Status: DC
  Filled 2021-04-18 (×3): qty 1

## 2021-04-18 NOTE — Assessment & Plan Note (Signed)
Replaced. Repeat in the morning.

## 2021-04-18 NOTE — Assessment & Plan Note (Signed)
BP prameters are optimal.

## 2021-04-18 NOTE — TOC Progression Note (Signed)
Transition of Care [facility]) - Progression Note    Patient Details  Name: Mindy Young MRN: [id number] Date of Birth: 1945-06-19  [facility] CM/SW [place], [place] Phone Number: 04/18/2021, 1:34 PM  Clinical Narrative:   CSW met with patient to discuss recommendation for SNF placement. Patient adamantly refusing, says she isn't going anywhere but home. Patient says she has a caregiver who will stay with her 24/7 and can provide physical assist. Patient agreeable to home health at discharge, hopeful for discharge home soon.     Expected Discharge Plan: [place] Barriers to Discharge: Continued Medical Work up  Expected Discharge Plan and Services Expected Discharge Plan: [place] In-house Referral: Clinical Social Work Discharge Planning Services: CM Consult   Living arrangements for the past 2 months: [place]                                       Social Determinants of Health (SDOH) Interventions    Readmission Risk Interventions Readmission Risk Prevention Plan 02/23/2021  Transportation Screening Complete  Medication Review Press photographer) Complete  PCP or Specialist appointment within 3-5 days of discharge Complete  HRI or [place] Complete  SW Recovery Care/Counseling Consult Complete  [place] Patient Refused  Some recent data might be hidden

## 2021-04-18 NOTE — Assessment & Plan Note (Signed)
Persistent and chronic.  Secondary to chronic alcohol abuse. 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  Stabilized at 127 and not improving much.  Will hold fluids and monitor.

## 2021-04-18 NOTE — Assessment & Plan Note (Signed)
Resume home meds. No seizure episodes this admission.

## 2021-04-18 NOTE — Assessment & Plan Note (Signed)
Right old face laceration.  ENT consulted for further evaluation.  Clean with hydrogen peroxide once per day and saline gauze to the wound twice per day.  Added ancef for wound coverage.

## 2021-04-18 NOTE — Progress Notes (Signed)
HOSPITAL MEDICINE OVERNIGHT EVENT NOTE    Notified by nursing that patient is still able to nearly slide out of bed despite wrist restraints and is at extremely high risk of recurrent fall or injuring herself.  We will go ahead and add a soft waist belt to the restraint order.  Furthermore, I note the patient has become increasingly hypertensive throughout the evening, concerning for progressive withdrawal.  Continue CIWA protocol with as needed benzodiazepines and monitor vital signs and CIWA assessments closely.  Vernelle Emerald  MD [facility]

## 2021-04-18 NOTE — Assessment & Plan Note (Signed)
Subdural hematomas both acute and chronic Probably secondary to falls from alcohol intoxication. Repeat CT ordered and neurosurgery consulted and recommendations given.  No further intervention at this time.  Patient has facial trauma and a facial laceration, has been more than 24 hours.  Dr. Janace Hoard consulted for further evaluation of facial laceration. Therapy evaluations ordered. Recommending home health PT/OT.   CT head which was ordered on admissions was done last night showed  Enlarging low-attenuation subdural hematoma along the left cerebral convexity resulting in increasing mass effect upon the left cerebral hemisphere and developing 4 mm left-to-right midline shift. Stable 3 mm subdural hematoma along the left tentorium. On call physician Dr Cyd Silence discussed with NS and  Suggested that these findings are chronic. Recommended outpatient follow up with Dr Reatha Armour in 2 weeks and a CT head ordered.   Pt had multiple falls today, confused and in withdrawals. Sitter ordered but not available. Restraints ordered. She has multiple  wounds/ lacerations and wound on the legs and arms. Unable to reach the daughter to update the family.

## 2021-04-18 NOTE — Progress Notes (Signed)
Observed patient from the doorway sitting on the floor in front of the sink; external cath. Still attached; bleeding from scab just below right knee; patient is alert but confused to place and time; staff called to room to assist patient back to bed; MD paged by Ryan,RN.  Unwitnessed fall.

## 2021-04-18 NOTE — Assessment & Plan Note (Signed)
Persistent and is the reason for recurrent admissions.  On alcohol withdrawal protocol.

## 2021-04-18 NOTE — Progress Notes (Signed)
[facility]                                                                               Mindy Young, is a 76 y.o. female, DOB - 03-23-1945, WUJ:[id number] Admit date - 04/15/2021    Outpatient Primary MD for the patient is Loura Pardon, MD  LOS - 3  days    Brief summary   Mindy Young is a 76 y.o. Caucasian female with medical history significant for alcohol abuse, anxiety, depression, GERD, seizure disorder and previous SDH, hypertension, tardive dyskinesia and breast cancer status post lumpectomy, who presented to the emergency room with acute onset of recurrent falls.   Head CT scan revealed acute subdural hemorrhage layering along the left tentorial leaflet that is 3 mm thick and along the right cerebral convexity that is 2 mm thick with no significant mass effect.  It showed right posterior scalp contusion without acute fracture and chronic microvascular disease as well as cerebral atrophy. Maxillofacial CT which was motion limited without evidence of fracture. C-spine CT showed no evidence for acute fracture or traumatic malalignment.  It showed severe multilevel degenerative change and emphysema.    Pt seen and examined at bedside. CT head which was ordered on admissions was done last night showed  Enlarging low-attenuation subdural hematoma along the left cerebral convexity resulting in increasing mass effect upon the left cerebral hemisphere and developing 4 mm left-to-right midline shift. Stable 3 mm subdural hematoma along the left tentorium. On call physician Dr Cyd Silence discussed with NS and  Suggested that these findings are chronic. Recommended outpatient follow up with Dr Reatha Armour in 2 weeks and a CT head ordered.   Pt had multiple falls today, confused and in withdrawals. Sitter ordered but not available. Restraints ordered. She has multiple  wounds/ lacerations and wound on the legs and arms. Unable to reach the daughter to update the family.     Assessment & Plan    Assessment and Plan: * Subdural hematoma- (present on admission) Subdural hematomas both acute and chronic Probably secondary to falls from alcohol intoxication. Repeat CT ordered and neurosurgery consulted and recommendations given.  No further intervention at this time.  Patient has facial trauma and a facial laceration, has been more than 24 hours.  Dr. Janace Hoard consulted for further evaluation of facial laceration. Therapy evaluations ordered. Recommending home health PT/OT.   CT head which was ordered on admissions was done last night showed  Enlarging low-attenuation subdural hematoma along the left cerebral convexity resulting in increasing mass effect upon the left cerebral hemisphere and developing 4 mm left-to-right midline shift. Stable 3 mm subdural hematoma along the left tentorium. On call physician Dr Cyd Silence discussed with NS and  Suggested that these findings are chronic. Recommended outpatient follow up with Dr Reatha Armour in 2 weeks and a CT head ordered.   Pt had multiple falls today, confused and in withdrawals. Sitter ordered but not available. Restraints ordered. She has multiple  wounds/ lacerations and wound on the legs and arms. Unable to reach the daughter to update the family.     Face lacerations- (present on admission) Right old face laceration.  ENT consulted for  further evaluation.  Clean with hydrogen peroxide once per day and saline gauze to the wound twice per day.  Added ancef for wound coverage.   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  On alcohol withdrawal protocol.    Hypomagnesemia- (present on admission) Replaced. Repeat in the morning.   Seizure ([place]) Resume home meds. No seizure episodes this admission.   Hyponatremia- (present on admission) Persistent and chronic.  Secondary to chronic alcohol abuse.  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  Stabilized at 127 and not improving much.  Will hold fluids and monitor.   Hypertension- (present on admission) BP prameters are optimal.  COPD (chronic obstructive pulmonary disease) ([place])- (present on admission) No wheezing heard on exam.   GERD (gastroesophageal reflux disease)- (present on admission) Stable.   Anemia- (present on admission) Macrocytic,. Hemoglobin stable around 10.  Continue with vitamin b12 supplementation.        Code Status: full code.  DVT Prophylaxis:  SCDs Start: 04/15/21 1935   Level of Care: Level of care: Progressive Family Communication: none at bedside.   Disposition Plan:     Remains inpatient appropriate:  Persistent hyponatremia. IV antibiotics, alcohol withdrawal.   Procedures:  None.   Consultants:   ENT PALLIATIVE CARE  Antimicrobials:   Anti-infectives (From admission, onward)    Start     Dose/Rate Route Frequency Ordered Stop   04/16/21 1630  ceFAZolin (ANCEF) IVPB 1 g/50 mL premix        1 g 100 mL/hr over 30 Minutes Intravenous Every 8 hours 04/16/21 1538          Medications  Scheduled Meds:  folic acid  1 mg Oral Daily   levETIRAcetam  1,000 mg Oral BID   melatonin  10 mg Oral QHS   metoprolol succinate  50 mg Oral Daily   multivitamin with minerals  1 tablet Oral Daily   nicotine  14 mg Transdermal Daily   oxybutynin  10 mg Oral q morning   sodium chloride flush  10-40 mL Intracatheter Q12H   thiamine  100 mg Oral Daily   Or   thiamine  100 mg Intravenous Daily   vitamin B-12  3,000 mcg Oral q morning   Continuous Infusions:  sodium chloride Stopped (04/16/21 2154)    ceFAZolin (ANCEF) IV 1 g (04/18/21 1534)   PRN Meds:.acetaminophen **OR** acetaminophen, albuterol, diphenoxylate-atropine, LORazepam, LORazepam **OR** LORazepam, magnesium hydroxide, sodium chloride flush, traZODone    Subjective:   Mindy Young was seen and examined today. Pt alert and oriented to  place only . Restless. Denies any pain. Wants to go home.   Objective:   Vitals:   04/18/21 0334 04/18/21 0756 04/18/21 1135 04/18/21 1315  BP: 111/80 130/87 (!) 138/99 (!) 143/84  Pulse: 95 75 76 72  Resp: 18 20 18    Temp: 98 F (36.7 C) (!) 97.4 F (36.3 C) 97.6 F (36.4 C)   TempSrc: Axillary Oral Oral   SpO2: 96% 99% 100% 100%  Weight:      Height:        Intake/Output Summary (Last 24 hours) at 04/18/2021 1537 Last data filed at 04/18/2021 0719 Gross per 24 hour  Intake --  Output 1250 ml  Net -1250 ml   Filed Weights   04/15/21 1451  Weight: 61.2 kg     Exam General exam: ill appearing disheveled , with a facial laceration.  Respiratory system: Clear to  auscultation. Respiratory effort normal. Cardiovascular system: S1 & S2 heard, RRR. No JVD,No pedal edema. Gastrointestinal system: Abdomen is nondistended, soft and nontender.  Normal bowel sounds heard. Central nervous system: Alert and oriented to person only.  Extremities: multiple lacerations on the lower extremities.  Skin: multiple scab wounds and lacerations on the lower extremities and upper extremities.  Psychiatry: restless.      Data Reviewed:  I have personally reviewed following labs and imaging studies   CBC Lab Results  Component Value Date   WBC 5.3 04/18/2021   RBC 2.77 (L) 04/18/2021   HGB 10.0 (L) 04/18/2021   HCT 28.7 (L) 04/18/2021   MCV 103.6 (H) 04/18/2021   MCH 36.1 (H) 04/18/2021   PLT 271 04/18/2021   MCHC 34.8 04/18/2021   RDW 13.3 04/18/2021   LYMPHSABS 0.8 04/15/2021   MONOABS 1.0 04/15/2021   EOSABS 0.0 04/15/2021   BASOSABS 0.0 59/56/3875     Last metabolic panel Lab Results  Component Value Date   NA 127 (L) 04/18/2021   K 3.5 04/18/2021   CL 92 (L) 04/18/2021   CO2 23 04/18/2021   BUN 5 (L) 04/18/2021   CREATININE 0.64 04/18/2021   GLUCOSE 130 (H) 04/18/2021   GFRNONAA >60 04/18/2021   GFRAA >60 12/13/2018   CALCIUM 8.2 (L) 04/18/2021   PHOS 3.6  04/16/2021   PROT 5.1 (L) 04/15/2021   ALBUMIN 3.0 (L) 04/15/2021   BILITOT 0.6 04/15/2021   ALKPHOS 75 04/15/2021   AST 48 (H) 04/15/2021   ALT 40 04/15/2021   ANIONGAP 12 04/18/2021    CBG (last 3)  Recent Labs    04/15/21 2014  GLUCAP 80      Coagulation Profile: No results for input(s): INR, PROTIME in the last 168 hours.   Radiology Studies: CT HEAD WO CONTRAST (5MM)  Result Date: 04/17/2021 CLINICAL DATA:  Subdural hematoma EXAM: CT HEAD WITHOUT CONTRAST TECHNIQUE: Contiguous axial images were obtained from the base of the skull through the vertex without intravenous contrast. RADIATION DOSE REDUCTION: This exam was performed according to the departmental dose-optimization program which includes automated exposure control, adjustment of the mA and/or kV according to patient size and/or use of iterative reconstruction technique. COMPARISON:  04/15/2021 FINDINGS: Brain: Left low attenuation subdural hematoma along the left cerebral convexity demonstrates interval increase in size now measuring up to 8 mm in thickness and demonstrates increasing mild mass effect upon the left cerebral hemisphere. There is development of a 4 mm left-to-right midline shift. Hyperdense 3 mm subdural hematoma layering along the left tentorium is unchanged. Moderate subcortical and periventricular white matter changes are again identified in keeping with changes of small vessel ischemia. Mild parenchymal volume loss is commensurate with the patient's age. 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 Ventricular size is normal. No abnormal intra or extra-axial mass lesion. Cerebellum is otherwise unremarkable. Vascular: No hyperdense vessel or unexpected calcification. Skull: Normal. Negative for fracture or focal lesion. Sinuses/Orbits: Ocular lenses have been removed. Orbits are otherwise unremarkable. There is opacification of the left sphenoid sinus. Mild  mucosal thickening within the right maxillary sinus. Remaining paranasal sinuses are clear. Other: Mastoid air cells and middle ear cavities are clear. Soft tissue swelling involving the a parieto-occipital scalp bilaterally is unchanged. IMPRESSION: Enlarging low-attenuation subdural hematoma along the left cerebral convexity resulting in increasing mass effect upon the left cerebral hemisphere and developing 4 mm left-to-right midline shift. Stable 3 mm subdural hematoma along the left  tentorium Previously identified right frontotemporal subdural hematoma is not well appreciated on this examination Mild paranasal sinus disease. These results will be called to the ordering clinician or representative by the Radiologist Assistant, and communication documented in the PACS or [employer]. Electronically Signed   By: Fidela Salisbury M.D.   On: 04/17/2021 19:34        Hosie Poisson M.D. [facility] 04/18/2021, 3:37 PM  Available via Epic secure chat 7am-7pm After 7 pm, please refer to night coverage provider listed on amion.

## 2021-04-19 DIAGNOSIS — S065XAA Traumatic subdural hemorrhage with loss of consciousness status unknown, initial encounter: Secondary | ICD-10-CM | POA: Diagnosis not present

## 2021-04-19 DIAGNOSIS — J449 Chronic obstructive pulmonary disease, unspecified: Secondary | ICD-10-CM | POA: Diagnosis not present

## 2021-04-19 DIAGNOSIS — F1010 Alcohol abuse, uncomplicated: Secondary | ICD-10-CM | POA: Diagnosis not present

## 2021-04-19 LAB — CBC
HCT: 28.6 % — ABNORMAL LOW (ref 36.0–46.0)
Hemoglobin: 10 g/dL — ABNORMAL LOW (ref 12.0–15.0)
MCH: 35.3 pg — ABNORMAL HIGH (ref 26.0–34.0)
MCHC: 35 g/dL (ref 30.0–36.0)
MCV: 101.1 fL — ABNORMAL HIGH (ref 80.0–100.0)
Platelets: 188 10*3/uL (ref 150–400)
RBC: 2.83 MIL/uL — ABNORMAL LOW (ref 3.87–5.11)
RDW: 13.3 % (ref 11.5–15.5)
WBC: 4.5 10*3/uL (ref 4.0–10.5)
nRBC: 0 % (ref 0.0–0.2)

## 2021-04-19 LAB — MAGNESIUM: Magnesium: 1.5 mg/dL — ABNORMAL LOW (ref 1.7–2.4)

## 2021-04-19 LAB — BASIC METABOLIC PANEL WITH GFR
Anion gap: 12 (ref 5–15)
BUN: 6 mg/dL — ABNORMAL LOW (ref 8–23)
CO2: 26 mmol/L (ref 22–32)
Calcium: 9 mg/dL (ref 8.9–10.3)
Chloride: 92 mmol/L — ABNORMAL LOW (ref 98–111)
Creatinine, Ser: 0.8 mg/dL (ref 0.44–1.00)
GFR, Estimated: >60 mL/min (ref >60)
Glucose, Bld: 100 mg/dL — ABNORMAL HIGH (ref 70–99)
Potassium: 4.4 mmol/L (ref 3.5–5.1)
Sodium: 130 mmol/L — ABNORMAL LOW (ref 135–145)

## 2021-04-19 MED ORDER — LOSARTAN POTASSIUM 50 MG PO TABS
50.0000 mg | ORAL_TABLET | Freq: Every day | ORAL | Status: DC
  Administered 2021-04-19 – 2021-04-25 (×7): 50 mg via ORAL
  Filled 2021-04-19 (×7): qty 1

## 2021-04-19 MED ORDER — BACITRACIN-NEOMYCIN-POLYMYXIN OINTMENT TUBE
TOPICAL_OINTMENT | Freq: Two times a day (BID) | CUTANEOUS | Status: DC
  Filled 2021-04-19 (×2): qty 14

## 2021-04-19 MED ORDER — MAGNESIUM SULFATE 2 GM/50ML IV SOLN
2.0000 g | Freq: Once | INTRAVENOUS | Status: AC
  Administered 2021-04-19: 2 g via INTRAVENOUS
  Filled 2021-04-19: qty 50

## 2021-04-19 NOTE — Assessment & Plan Note (Signed)
Persistent and is the reason for recurrent admissions.  On alcohol withdrawal protocol.  Patient is also in restraints at this time

## 2021-04-19 NOTE — Progress Notes (Addendum)
Speech Language Pathology Treatment: Dysphagia  Patient Details Name: Mindy Young MRN: [id number] DOB: [birth year] Today's Date: 04/19/2021 Time: 2706-2376 SLP Time Calculation (min) (ACUTE ONLY): 13 min  Assessment / Plan / Recommendation Clinical Impression  Pt was seen for dysphagia treatment. Pt's nurse reported that the pt has been tolerating meds whole in puree. Pt's NT stated that the pt has exhibited a rapid intake rate during self-feeding and has demonstrated coughing with purees if cues are not provided. Pt tolerated puree, dysphagia 2, regular texture solids and thin liquids via cup and straw without overt s/sx of aspiration. Mastication was mildly prolonged with regular texture solids, but functional with dysphagia 3 solids. Oral clearance was adequate. Pt's diet will be advanced to dysphagia 3 with thin liquids. SLP will follow to ensure diet tolerance.    HPI HPI: Pt is a 76 y.o. female who presented to the ED with acute onset of recurrent falls. CT head 2/23: Acute subdural hemorrhage layering along the left  tentorium, measuring up to 3 mm in thickness. PMH: alcohol abuse, anxiety, depression, GERD, seizure disorder and previous SDH, hypertension, tardive dyskinesia and breast cancer status post lumpectomy.      SLP Plan  Continue with current plan of care      Recommendations for follow up therapy are one component of a multi-disciplinary discharge planning process, led by the attending physician.  Recommendations may be updated based on patient status, additional functional criteria and insurance authorization.    Recommendations  Diet recommendations: Dysphagia 3 (mechanical soft);Thin liquid Liquids provided via: Cup;Straw Medication Administration:  (crushed/whole with puree) Supervision: Staff to assist with self feeding;Full supervision/cueing for compensatory strategies Compensations: Slow rate;Small sips/bites;Minimize environmental distractions Postural Changes  and/or Swallow Maneuvers: Seated upright 90 degrees                Oral Care Recommendations: Oral care BID Follow Up Recommendations:  (TBD) SLP Visit Diagnosis: Dysphagia, unspecified (R13.10) Plan: Continue with current plan of care         Maddi Collar I. Hardin Negus, [place], [place] Office number [phone] Pager [place]  04/19/2021, 3:30 PM

## 2021-04-19 NOTE — Consult Note (Signed)
[place] Nurse Consult Note: Patient receiving care in [facility] 3W08 Reason for Consult: multiple wounds Wound type: scattered skin tears and abrasions from multiple falls according to the patient.  Type 3 skin tear on the left lateral forearm Type 3 skin tear on the right lateral upper arm Small wound on the left elbow Scattered abrasions on the BLE that are scabbed over.  Linear lesion next to the right eye that is scabbed over.  Pressure Injury POA: NA Drainage (amount, consistency, odor)  Dressing procedure/placement/frequency: Clean the legs and arms with soap and water, gently pat dry then apply a cut to fit piece of Xeroform gauze over all abrasions/skin tears on the arms and legs. Secure with Kerlix wrap. Change Xeroform daily.  Apply a thin layer of antibiotic ointment over the linear lesion next to the eye. Apply twice daily  Monitor the wound area(s) for worsening of condition such as: Signs/symptoms of infection, increase in size, development of or worsening of odor, development of pain, or increased pain at the affected locations.   Notify the medical team if any of these develop.  Thank you for the consult. [place] nurse will not follow at this time.   Please re-consult the [place] team if needed.  Cathlean Marseilles Tamala Julian, MSN, RN, [place], Lysle Pearl, [facility] Wound Treatment Associate Pager [phone]

## 2021-04-19 NOTE — Assessment & Plan Note (Signed)
Magnesium level around 1.5 this morning.  2 g of magnesium sulfate ordered repeat levels in the morning.

## 2021-04-19 NOTE — Plan of Care (Signed)
Pt. Has been sleeping most of the day. Sitter at bedside. Caretaker and her dog came to visit. Midline to the LUA flushed and blood returned.   Problem: Education: Goal: Knowledge of General Education information will improve Description: Including pain rating scale, medication(s)/side effects and non-pharmacologic comfort measures Outcome: Progressing   Problem: Health Behavior/Discharge Planning: Goal: Ability to manage health-related needs will improve Outcome: Progressing   Problem: Clinical Measurements: Goal: Ability to maintain clinical measurements within normal limits will improve Outcome: Progressing Goal: Will remain free from infection Outcome: Progressing Goal: Diagnostic test results will improve Outcome: Progressing Goal: Respiratory complications will improve Outcome: Progressing Goal: Cardiovascular complication will be avoided Outcome: Progressing   Problem: Activity: Goal: Risk for activity intolerance will decrease Outcome: Progressing   Problem: Nutrition: Goal: Adequate nutrition will be maintained Outcome: Progressing   Problem: Coping: Goal: Level of anxiety will decrease Outcome: Progressing   Problem: Elimination: Goal: Will not experience complications related to bowel motility Outcome: Progressing Goal: Will not experience complications related to urinary retention Outcome: Progressing   Problem: Safety: Goal: Ability to remain free from injury will improve Outcome: Progressing   Problem: Skin Integrity: Goal: Risk for impaired skin integrity will decrease Outcome: Progressing   Problem: Safety: Goal: Non-violent Restraint(s) Outcome: Progressing

## 2021-04-19 NOTE — Assessment & Plan Note (Signed)
No seizure episodes since admission As per pharmacy patient is not taking phenobarbital. Continue with Keppra 1000 mg twice daily.

## 2021-04-19 NOTE — Progress Notes (Signed)
Physical Therapy Treatment Patient Details Name: Mindy Young MRN: [id number] DOB: 06/25/1945 Today's Date: 04/19/2021   History of Present Illness 76 yo female presented to ED with head trauma resulting from a fall while intoxicated. Pt was found to have Hyponatremia.  Pt has history of falls, at least 3 in the past 2 weeks where she presented to the ED according to he chart. PMHx:  alcohol abuse, anxiety, depression, GERD, seizure disorder and previous SDH, hypertension, tardive dyskinesia, and breast cancer status post lumpectomy, UTI,    PT Comments    Per chart pt very agitated last night and received ativan. Pt more lethargic today with noted decreased insight to deficits and safety. Pt did recognize her service dog and care giver [name] stating "She is my everything." [name] reports family doesn't have anything to do with her and that the patient hires her from 7am-7pm. [name] said pt has been falling a lot and continues to drink. Recommending SNF at this time or for patient to hire additional personal caregivers to provide 24/7 assist. Pt stating "I can't think about that right now.". Acute PT to cont to follow.    Recommendations for follow up therapy are one component of a multi-disciplinary discharge planning process, led by the attending physician.  Recommendations may be updated based on patient status, additional functional criteria and insurance authorization.  Follow Up Recommendations  Skilled nursing-short term rehab (<3 hours/day)     Assistance Recommended at Discharge Frequent or constant Supervision/Assistance  Patient can return home with the following A lot of help with walking and/or transfers;A lot of help with bathing/dressing/bathroom;Assistance with cooking/housework;Direct supervision/assist for financial management;Assistance with feeding;Assist for transportation;Direct supervision/assist for medications management;Help with stairs or ramp for entrance   Equipment  Recommendations  None recommended by PT    Recommendations for Other Services       Precautions / Restrictions Precautions Precautions: Fall Precaution Comments: posey belt in bed with bilat wrist retraints Restrictions Weight Bearing Restrictions: No     Mobility  Bed Mobility Overal bed mobility: Needs Assistance Bed Mobility: Supine to Sit, Sit to Supine     Supine to sit: Min assist Sit to supine: Min guard   General bed mobility comments: increased time, max verbal and tactile cues to sit up on EOB, pt dizzy upon sitting with noted swaying back and forth    Transfers Overall transfer level: Needs assistance Equipment used: 1 person hand held assist Transfers: Sit to/from Stand Sit to Stand: Min assist, Mod assist           General transfer comment: impulsive, trying to stand but unable without assist. Pt with wide base of support, pt attempted to take step forward however very unsteady and swaying back/forth, pt did complete 3 sit to stand and took 4 side steps to [facility] but very unsteady    Ambulation/Gait                   Stairs             Wheelchair Mobility    Modified Rankin (Stroke Patients Only)       Balance Overall balance assessment: Needs assistance Sitting-balance support: Feet supported Sitting balance-Leahy Scale: Fair Sitting balance - Comments: minA as pt with significant swaying back and forth, pt attempt to bend over to kiss her service dog and became very dizzy   Standing balance support: Single extremity supported, During functional activity Standing balance-Leahy Scale: Poor Standing balance comment: very dependent on  PT                            Cognition Arousal/Alertness: Lethargic Behavior During Therapy: Flat affect Overall Cognitive Status: Difficult to assess Area of Impairment: Orientation, Attention, Memory, Following commands, Safety/judgement, Awareness, Problem solving                  Orientation Level: Disoriented to, Time Current Attention Level: Focused Memory: Decreased short-term memory, Decreased recall of precautions Following Commands: Follows one step commands inconsistently, Follows one step commands with increased time Safety/Judgement: Decreased awareness of safety, Decreased awareness of deficits (in posey belt with bilat wrist restraints) Awareness: Intellectual Problem Solving: Slow processing, Decreased initiation, Requires verbal cues General Comments: pt received ativan last night due to severe agitation. [name], the caregiver, was present and stated she has poor safety awareness as she is falling alot recently and continues to drink despite changes in her medication that state she can't drink on them.        Exercises      General Comments General comments (skin integrity, edema, etc.): VSS on RA      Pertinent Vitals/Pain Pain Assessment Pain Assessment: Faces Faces Pain Scale: Hurts a little bit Pain Location: generalized with movement Pain Descriptors / Indicators: Discomfort    Home Living                          Prior Function            PT Goals (current goals can now be found in the care plan section) Acute Rehab PT Goals PT Goal Formulation: With patient Time For Goal Achievement: 04/30/21 Potential to Achieve Goals: Fair Progress towards PT goals: Not progressing toward goals - comment (pt more lethargic)    Frequency    Min 3X/week      PT Plan Current plan remains appropriate    Co-evaluation              AM-PAC PT "6 Clicks" Mobility   Outcome Measure  Help needed turning from your back to your side while in a flat bed without using bedrails?: A Lot Help needed moving from lying on your back to sitting on the side of a flat bed without using bedrails?: A Lot Help needed moving to and from a bed to a chair (including a wheelchair)?: A Lot Help needed standing up from a chair using your arms  (e.g., wheelchair or bedside chair)?: A Lot Help needed to walk in hospital room?: A Lot Help needed climbing 3-5 steps with a railing? : Total 6 Click Score: 11    End of Session Equipment Utilized During Treatment: Gait belt Activity Tolerance: Patient limited by lethargy Patient left: in bed;with restraints reapplied;with bed alarm set;with call bell/phone within reach;with family/visitor present Nurse Communication: Mobility status PT Visit Diagnosis: Unsteadiness on feet (R26.81);Other symptoms and signs involving the nervous system (R29.898);Other abnormalities of gait and mobility (R26.89)     Time: 0102-7253 PT Time Calculation (min) (ACUTE ONLY): 26 min  Charges:  $Therapeutic Activity: 8-22 mins $Neuromuscular Re-education: 8-22 mins                     Kittie Plater, PT, DPT Acute Rehabilitation Services Pager #: [phone] Office #: [phone]    Berline Lopes 04/19/2021, 2:50 PM

## 2021-04-19 NOTE — Assessment & Plan Note (Signed)
Right old face laceration.  ENT consulted for further evaluation.  Clean with hydrogen peroxide once per day and saline gauze to the wound twice per day.  Added ancef for wound coverage.  Transition to Keflex on discharge

## 2021-04-19 NOTE — Assessment & Plan Note (Signed)
Persistent and chronic.  Secondary to chronic alcohol abuse. 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  Sodium has improved to 130 with fluid restriction.  Continue to monitor

## 2021-04-19 NOTE — Assessment & Plan Note (Addendum)
Subdural hematomas both acute and chronic Probably secondary to falls from alcohol intoxication. Repeat CT ordered and neurosurgery consulted and recommendations given.  No further intervention at this time.  Patient has facial trauma and a facial laceration, has been more than 24 hours.  Dr. Janace Hoard consulted for further evaluation of facial laceration. Therapy evaluations ordered. Recommending home health PT/OT.   CT head which was ordered on admissions was done the night of 2/25. Enlarging low-attenuation subdural hematoma along the left cerebral convexity resulting in increasing mass effect upon the left cerebral hemisphere and developing 4 mm left-to-right midline shift. Stable 3 mm subdural hematoma along the left tentorium. On call physician Dr Cyd Silence discussed with NS and  Suggested that these findings are chronic. Recommended outpatient follow up with Dr Reatha Armour in 2 weeks and a CT head ordered.   Pt had multiple falls on 2/26 due to confusion and she is active withdrawals.Kennon Holter ordered but not available. Restraints ordered. She has multiple  wounds/ lacerations and wound on the legs and arms. Unable to reach the daughter to update the family.   X rays of the knees and wrist negative for acute fractures.

## 2021-04-19 NOTE — Care Management Important Message (Signed)
Important Message  Patient Details  Name: Mindy Young MRN: [id number] Date of Birth: January 20, 1946   Medicare Important Message Given:  Yes     Orbie Pyo 04/19/2021, 2:46 PM

## 2021-04-19 NOTE — Assessment & Plan Note (Signed)
BP parameters elevated probably in withdrawals.  Started her on losartan.  Continue to monitor.

## 2021-04-19 NOTE — Plan of Care (Signed)
Pt is alert oriented x 4. Restless and attempting to get out of bed.  Received order for abdominal restraints as pt was still trying to get out of bed and had legs hanging off bed with swr. Pt begging for alcohol. CIWA scale applied Prn ativan given as needed. Continous O2 monitoring. Pt has multiple wounds due to falls at home. Pt has safety sitter now.    Problem: Education: Goal: Knowledge of General Education information will improve Description: Including pain rating scale, medication(s)/side effects and non-pharmacologic comfort measures Outcome: Progressing   Problem: Health Behavior/Discharge Planning: Goal: Ability to manage health-related needs will improve Outcome: Progressing   Problem: Clinical Measurements: Goal: Ability to maintain clinical measurements within normal limits will improve Outcome: Progressing Goal: Will remain free from infection Outcome: Progressing Goal: Diagnostic test results will improve Outcome: Progressing Goal: Respiratory complications will improve Outcome: Progressing Goal: Cardiovascular complication will be avoided Outcome: Progressing   Problem: Activity: Goal: Risk for activity intolerance will decrease Outcome: Progressing   Problem: Nutrition: Goal: Adequate nutrition will be maintained Outcome: Progressing   Problem: Coping: Goal: Level of anxiety will decrease Outcome: Progressing   Problem: Elimination: Goal: Will not experience complications related to bowel motility Outcome: Progressing Goal: Will not experience complications related to urinary retention Outcome: Progressing   Problem: Safety: Goal: Ability to remain free from injury will improve Outcome: Progressing   Problem: Skin Integrity: Goal: Risk for impaired skin integrity will decrease Outcome: Progressing   Problem: Safety: Goal: Non-violent Restraint(s) Outcome: Progressing

## 2021-04-19 NOTE — Progress Notes (Signed)
[facility]                                                                               [name], is a 76 y.o. female, DOB - Feb 15, 1946, QJF:[id number] Admit date - 04/15/2021    Outpatient Primary MD for the patient is Mindy Pardon, MD  LOS - 4  days    Brief summary   Mindy Young is a 76 y.o. Caucasian female with medical history significant for alcohol abuse, anxiety, depression, GERD, seizure disorder and previous SDH, hypertension, tardive dyskinesia and breast cancer status post lumpectomy, who presented to the emergency room with acute onset of recurrent falls.   Head CT scan revealed acute subdural hemorrhage layering along the left tentorial leaflet that is 3 mm thick and along the right cerebral convexity that is 2 mm thick with no significant mass effect.  It showed right posterior scalp contusion without acute fracture and chronic microvascular disease as well as cerebral atrophy. Maxillofacial CT which was motion limited without evidence of fracture. C-spine CT showed no evidence for acute fracture or traumatic malalignment.  It showed severe multilevel degenerative change and emphysema.    Pt seen and examined at bedside. CT head which was ordered on admissions was done last night showed  Enlarging low-attenuation subdural hematoma along the left cerebral convexity resulting in increasing mass effect upon the left cerebral hemisphere and developing 4 mm left-to-right midline shift. Stable 3 mm subdural hematoma along the left tentorium. On call physician Mindy Mindy Young discussed with NS and  Suggested that these findings are chronic. Recommended outpatient follow up with Mindy Mindy Young in 2 weeks and a CT head ordered.   Pt had multiple falls on 2/26, confused and in withdrawals. Sitter ordered . Restraints ordered. She has multiple  wounds/ lacerations and wound on the legs and arms. Unable to reach the daughter to update the family.  Pt sleepy and refused  breakfast.    Assessment & Plan    Assessment and Plan: * Subdural hematoma- (present on admission) Subdural hematomas both acute and chronic Probably secondary to falls from alcohol intoxication. Repeat CT ordered and neurosurgery consulted and recommendations given.  No further intervention at this time.  Patient has facial trauma and a facial laceration, has been more than 24 hours.  Mindy. Janace Young consulted for further evaluation of facial laceration. Therapy evaluations ordered. Recommending home health PT/OT.   CT head which was ordered on admissions was done the night of 2/25. Enlarging low-attenuation subdural hematoma along the left cerebral convexity resulting in increasing mass effect upon the left cerebral hemisphere and developing 4 mm left-to-right midline shift. Stable 3 mm subdural hematoma along the left tentorium. On call physician Mindy Mindy Young discussed with NS and  Suggested that these findings are chronic. Recommended outpatient follow up with Mindy Mindy Young in 2 weeks and a CT head ordered.   Pt had multiple falls on 2/26 due to confusion and she is active withdrawals.Mindy Young ordered but not available. Restraints ordered. She has multiple  wounds/ lacerations and wound on the legs and arms. Unable to reach the daughter to update the family.   X rays of the knees  and wrist negative for acute fractures.     Face lacerations- (present on admission) Right old face laceration.  ENT consulted for further evaluation.  Clean with hydrogen peroxide once per day and saline gauze to the wound twice per day.  Added ancef for wound coverage.  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  On alcohol withdrawal protocol.  Patient is also in restraints at this time   Hypomagnesemia- (present on admission) Magnesium level around 1.5 this morning.   2 g of magnesium sulfate ordered repeat levels in the morning.  Seizure ([place]) No seizure episodes since admission As per pharmacy patient is not taking phenobarbital. Continue with Keppra 1000 mg twice daily.   Hyponatremia- (present on admission) Persistent and chronic.  Secondary to chronic alcohol abuse. 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  Sodium has improved to 130 with fluid restriction.  Continue to monitor  Hypertension- (present on admission) BP parameters elevated probably in withdrawals.  Started her on losartan.  Continue to monitor.   COPD (chronic obstructive pulmonary disease) ([place])- (present on admission) No wheezing heard on exam.   GERD (gastroesophageal reflux disease)- (present on admission) Stable.   Anemia- (present on admission) Macrocytic,. Hemoglobin stable around 10.  Continue with vitamin b12 supplementation.        Code Status: full code.  DVT Prophylaxis:  SCDs Start: 04/15/21 1935   Level of Care: Level of care: Progressive Family Communication: none at bedside.   Disposition Plan:     Remains inpatient appropriate:  Persistent hyponatremia. IV antibiotics, alcohol withdrawal.   Procedures:  None.   Consultants:   ENT PALLIATIVE CARE  Antimicrobials:   Anti-infectives (From admission, onward)    Start     Dose/Rate Route Frequency Ordered Stop   04/16/21 1630  ceFAZolin (ANCEF) IVPB 1 g/50 mL premix        1 g 100 mL/hr over 30 Minutes Intravenous Every 8 hours 04/16/21 1538          Medications  Scheduled Meds:  folic acid  1 mg Oral Daily   levETIRAcetam  1,000 mg Oral BID   losartan  50 mg Oral Daily   melatonin  10 mg Oral QHS   metoprolol succinate  50 mg Oral Daily   multivitamin with minerals  1 tablet Oral Daily   nicotine  14 mg Transdermal Daily   oxybutynin  10 mg Oral q morning   sodium chloride flush  10-40 mL Intracatheter Q12H   thiamine  100 mg Oral Daily   vitamin B-12  3,000 mcg Oral q morning    Continuous Infusions:   ceFAZolin (ANCEF) IV 1 g (04/19/21 3748)   magnesium sulfate bolus IVPB     PRN Meds:.acetaminophen **OR** acetaminophen, albuterol, diphenoxylate-atropine, LORazepam, LORazepam **OR** [PENDING] LORazepam **OR** LORazepam **OR** [PENDING] LORazepam, magnesium hydroxide, sodium chloride flush, traZODone    Subjective:   Mindy Young was seen and examined today. Pt in restraints sleepy. Refusing breakfast.   Objective:   Vitals:   04/18/21 2317 04/19/21 0039 04/19/21 0337 04/19/21 0809  BP: 133/84 (!) 140/91 (!) 119/107 (!) 143/101  Pulse: 63 65 73   Resp: 16  16 16   Temp: (!) 97.3 F (36.3 C)  98.2 F (36.8 C) 97.9 F (36.6 C)  TempSrc: Axillary  Oral Axillary  SpO2: 97%  98%   Weight:      Height:  Intake/Output Summary (Last 24 hours) at 04/19/2021 1101 Last data filed at 04/18/2021 2205 Gross per 24 hour  Intake 289.41 ml  Output 550 ml  Net -260.59 ml   Filed Weights   04/15/21 1451  Weight: 61.2 kg     Exam General exam: Appears calm and comfortable  Respiratory system: Clear to auscultation. Respiratory effort normal. Cardiovascular system: S1 & S2 heard, RRR. No JVD, No pedal edema. Gastrointestinal system: Abdomen is nondistended, soft and nontender.  Normal bowel sounds heard. Central nervous system: Somnolent and sedated, able to move all extremities spontaneously Extremities: ulceration on the knee. Skin: Bruising over the upper and lower extremities Psychiatry: Unable to assess due to somnolence     Data Reviewed:  I have personally reviewed following labs and imaging studies   CBC Lab Results  Component Value Date   WBC 4.5 04/19/2021   RBC 2.83 (L) 04/19/2021   HGB 10.0 (L) 04/19/2021   HCT 28.6 (L) 04/19/2021   MCV 101.1 (H) 04/19/2021   MCH 35.3 (H) 04/19/2021   PLT 188 04/19/2021   MCHC 35.0 04/19/2021   RDW 13.3 04/19/2021   LYMPHSABS 0.8 04/15/2021   MONOABS 1.0 04/15/2021   EOSABS 0.0 04/15/2021    BASOSABS 0.0 51/76/1607     Last metabolic panel Lab Results  Component Value Date   NA 130 (L) 04/19/2021   K 4.4 04/19/2021   CL 92 (L) 04/19/2021   CO2 26 04/19/2021   BUN 6 (L) 04/19/2021   CREATININE 0.80 04/19/2021   GLUCOSE 100 (H) 04/19/2021   GFRNONAA >60 04/19/2021   GFRAA >60 12/13/2018   CALCIUM 9.0 04/19/2021   PHOS 3.6 04/16/2021   PROT 5.1 (L) 04/15/2021   ALBUMIN 3.0 (L) 04/15/2021   BILITOT 0.6 04/15/2021   ALKPHOS 75 04/15/2021   AST 48 (H) 04/15/2021   ALT 40 04/15/2021   ANIONGAP 12 04/19/2021    CBG (last 3)  No results for input(s): GLUCAP in the last 72 hours.     Coagulation Profile: No results for input(s): INR, PROTIME in the last 168 hours.   Radiology Studies: DG Wrist 2 Views Left  Result Date: 04/18/2021 CLINICAL DATA:  Trauma, pain EXAM: LEFT WRIST - 2 VIEW COMPARISON:  None. FINDINGS: No recent fracture or dislocation is seen. Degenerative changes are noted in the radiocarpal joint. IMPRESSION: No recent fracture or dislocation is seen. Degenerative changes are noted. Electronically Signed   By: Elmer Picker M.D.   On: 04/18/2021 17:07   DG Wrist 2 Views Right  Result Date: 04/18/2021 CLINICAL DATA:  Trauma, pain EXAM: RIGHT WRIST - 2 VIEW COMPARISON:  None. FINDINGS: No recent fracture or dislocation is seen. Degenerative changes are noted in the first metacarpophalangeal joint. There are faint calcifications in the soft tissues adjacent to distal ulna, possibly cartilage or ligament calcification. IMPRESSION: No recent fracture or dislocation is seen in the right wrist. Degenerative changes are noted in right first metacarpophalangeal joint. Electronically Signed   By: Elmer Picker M.D.   On: 04/18/2021 17:08   DG Knee 1-2 Views Left  Result Date: 04/18/2021 CLINICAL DATA:  Trauma, pain EXAM: LEFT KNEE - 1-2 VIEW COMPARISON:  03/30/2021 FINDINGS: No recent fracture or dislocation is seen. There is no significant  effusion. Degenerative changes are noted with joint space narrowing and bony spurs in the medial compartment. Bony spurs are noted in the lateral and patellofemoral compartments along with meniscal cartilage calcifications. IMPRESSION: No recent fracture is seen. Degenerative changes are  noted, more severe in the medial compartment. Electronically Signed   By: Elmer Picker M.D.   On: 04/18/2021 17:10   DG Knee 1-2 Views Right  Result Date: 04/18/2021 CLINICAL DATA:  Trauma, pain EXAM: RIGHT KNEE - 1-2 VIEW COMPARISON:  None. FINDINGS: No recent fracture or dislocation is seen. Degenerative changes are noted with bony spurs and calcifications in the meniscal cartilages. There is no significant effusion. IMPRESSION: No fracture or dislocation is seen in the right knee. Degenerative changes are noted with bony spurs. Electronically Signed   By: Elmer Picker M.D.   On: 04/18/2021 17:09   CT HEAD WO CONTRAST (5MM)  Result Date: 04/17/2021 CLINICAL DATA:  Subdural hematoma EXAM: CT HEAD WITHOUT CONTRAST TECHNIQUE: Contiguous axial images were obtained from the base of the skull through the vertex without intravenous contrast. RADIATION DOSE REDUCTION: This exam was performed according to the departmental dose-optimization program which includes automated exposure control, adjustment of the mA and/or kV according to patient size and/or use of iterative reconstruction technique. COMPARISON:  04/15/2021 FINDINGS: Brain: Left low attenuation subdural hematoma along the left cerebral convexity demonstrates interval increase in size now measuring up to 8 mm in thickness and demonstrates increasing mild mass effect upon the left cerebral hemisphere. There is development of a 4 mm left-to-right midline shift. Hyperdense 3 mm subdural hematoma layering along the left tentorium is unchanged. Moderate subcortical and periventricular white matter changes are again identified in keeping with changes of small  vessel ischemia. Mild parenchymal volume loss is commensurate with the patient's age. 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 Ventricular size is normal. No abnormal intra or extra-axial mass lesion. Cerebellum is otherwise unremarkable. Vascular: No hyperdense vessel or unexpected calcification. Skull: Normal. Negative for fracture or focal lesion. Sinuses/Orbits: Ocular lenses have been removed. Orbits are otherwise unremarkable. There is opacification of the left sphenoid sinus. Mild mucosal thickening within the right maxillary sinus. Remaining paranasal sinuses are clear. Other: Mastoid air cells and middle ear cavities are clear. Soft tissue swelling involving the a parieto-occipital scalp bilaterally is unchanged. IMPRESSION: Enlarging low-attenuation subdural hematoma along the left cerebral convexity resulting in increasing mass effect upon the left cerebral hemisphere and developing 4 mm left-to-right midline shift. Stable 3 mm subdural hematoma along the left tentorium Previously identified right frontotemporal subdural hematoma is not well appreciated on this examination Mild paranasal sinus disease. These results will be called to the ordering clinician or representative by the Radiologist Assistant, and communication documented in the PACS or [employer]. Electronically Signed   By: Fidela Salisbury M.D.   On: 04/17/2021 19:34        Hosie Poisson M.D. [facility] 04/19/2021, 11:01 AM  Available via Epic secure chat 7am-7pm After 7 pm, please refer to night coverage provider listed on amion.

## 2021-04-20 DIAGNOSIS — J449 Chronic obstructive pulmonary disease, unspecified: Secondary | ICD-10-CM | POA: Diagnosis not present

## 2021-04-20 DIAGNOSIS — F1010 Alcohol abuse, uncomplicated: Secondary | ICD-10-CM | POA: Diagnosis not present

## 2021-04-20 DIAGNOSIS — S065XAA Traumatic subdural hemorrhage with loss of consciousness status unknown, initial encounter: Secondary | ICD-10-CM | POA: Diagnosis not present

## 2021-04-20 LAB — BASIC METABOLIC PANEL
Anion gap: 10 (ref 5–15)
BUN: 6 mg/dL — ABNORMAL LOW (ref 8–23)
CO2: 26 mmol/L (ref 22–32)
Calcium: 8.6 mg/dL — ABNORMAL LOW (ref 8.9–10.3)
Chloride: 91 mmol/L — ABNORMAL LOW (ref 98–111)
Creatinine, Ser: 0.63 mg/dL (ref 0.44–1.00)
GFR, Estimated: >60 mL/min (ref >60)
Glucose, Bld: 131 mg/dL — ABNORMAL HIGH (ref 70–99)
Potassium: 3.7 mmol/L (ref 3.5–5.1)
Sodium: 127 mmol/L — ABNORMAL LOW (ref 135–145)

## 2021-04-20 LAB — PHOSPHORUS: Phosphorus: 4.3 mg/dL (ref 2.5–4.6)

## 2021-04-20 LAB — MAGNESIUM: Magnesium: 1.4 mg/dL — ABNORMAL LOW (ref 1.7–2.4)

## 2021-04-20 MED ORDER — THIAMINE HCL 100 MG/ML IJ SOLN
500.0000 mg | Freq: Once | INTRAVENOUS | Status: AC
  Administered 2021-04-20: 500 mg via INTRAVENOUS
  Filled 2021-04-20: qty 5

## 2021-04-20 MED ORDER — MAGNESIUM SULFATE 4 GM/100ML IV SOLN
4.0000 g | Freq: Once | INTRAVENOUS | Status: AC
  Administered 2021-04-20: 4 g via INTRAVENOUS
  Filled 2021-04-20: qty 100

## 2021-04-20 MED ORDER — CEPHALEXIN 500 MG PO CAPS
500.0000 mg | ORAL_CAPSULE | Freq: Two times a day (BID) | ORAL | Status: AC
  Administered 2021-04-20 – 2021-04-23 (×8): 500 mg via ORAL
  Filled 2021-04-20 (×8): qty 1

## 2021-04-20 MED ORDER — MAGNESIUM OXIDE -MG SUPPLEMENT 400 (240 MG) MG PO TABS
400.0000 mg | ORAL_TABLET | Freq: Two times a day (BID) | ORAL | Status: DC
  Administered 2021-04-21 – 2021-04-24 (×8): 400 mg via ORAL
  Filled 2021-04-20 (×8): qty 1

## 2021-04-20 NOTE — Progress Notes (Signed)
Occupational Therapy Treatment Patient Details Name: Mindy Young MRN: [id number] DOB: 02/16/1946 Today's Date: 04/20/2021   History of present illness 76 yo female presented to ED with head trauma resulting from a fall while intoxicated. Pt was found to have Hyponatremia.  Pt has history of falls, at least 3 in the past 2 weeks where she presented to the ED according to he chart. PMHx:  alcohol abuse, anxiety, depression, GERD, seizure disorder and previous SDH, hypertension, tardive dyskinesia, and breast cancer status post lumpectomy, UTI,   OT comments  Mindy Young is making functional progress towards her goals, however she continues to be limited by impaired cognition. Pt required simple repetitive cues to carry out functional tasks. She was perseverating on headache pain and difficult to re-direct. Pt unable to recall 3 words after ~1 minuet delay. Overall she required min A for transfers and ambulation with RW for safety and sequencing. Pt continues to benefit from OT acutely. D/c recommendation remains appropriate.    Recommendations for follow up therapy are one component of a multi-disciplinary discharge planning process, led by the attending physician.  Recommendations may be updated based on patient status, additional functional criteria and insurance authorization.    Follow Up Recommendations  Skilled nursing-short term rehab (<3 hours/day)    Assistance Recommended at Discharge Frequent or constant Supervision/Assistance  Patient can return home with the following  A little help with walking and/or transfers;A little help with bathing/dressing/bathroom;Assist for transportation;Help with stairs or ramp for entrance;Assistance with cooking/housework   Equipment Recommendations  None recommended by OT       Precautions / Restrictions Precautions Precautions: Fall Restrictions Weight Bearing Restrictions: No       Mobility Bed Mobility Overal bed mobility: Needs Assistance        Supine to sit: Min guard Sit to supine: Min guard        Transfers Overall transfer level: Needs assistance Equipment used: None Transfers: Sit to/from Stand Sit to Stand: Min assist           General transfer comment: fluctuating min guard and min A for transfers     Balance Overall balance assessment: Needs assistance Sitting-balance support: Feet supported Sitting balance-Leahy Scale: Fair     Standing balance support: Single extremity supported, During functional activity Standing balance-Leahy Scale: Poor Standing balance comment: forearms supported on sink                           ADL either performed or assessed with clinical judgement   ADL                           Toilet Transfer: Minimal assistance;Ambulation;Rolling walker (2 wheels)           Functional mobility during ADLs: Minimal assistance General ADL Comments: perseverating on pain this session and difficult to re-direct. min A for RW management and cues for safety and sequencing    Extremity/Trunk Assessment Upper Extremity Assessment LUE Deficits / Details: ROM WFL, MMT generally 4/5, slow and deliberate coordiantion testing LUE Coordination: decreased fine motor   Lower Extremity Assessment Lower Extremity Assessment: Defer to PT evaluation        Vision   Vision Assessment?: Vision impaired- to be further tested in functional context Additional Comments: difficult to assess as pt continue to have impaired command following          Cognition Arousal/Alertness: Awake/alert Behavior During Therapy:  Flat affect Overall Cognitive Status: Impaired/Different from baseline Area of Impairment: Orientation, Attention, Memory, Following commands, Safety/judgement, Awareness, Problem solving                 Orientation Level: Disoriented to, Time Current Attention Level: Focused Memory: Decreased short-term memory, Decreased recall of  precautions Following Commands: Follows one step commands inconsistently, Follows one step commands with increased time Safety/Judgement: Decreased awareness of safety, Decreased awareness of deficits Awareness: Emergent Problem Solving: Slow processing, Decreased initiation, Requires verbal cues General Comments: pt does not remember events from prior in the day (working with PT), she requires simple repetitive cues for funcitonal tasks adn safety. max encouragement needed to increase participation.              General Comments VSS onRA    Pertinent Vitals/ Pain       Pain Assessment Pain Assessment: Faces Faces Pain Scale: Hurts little more Pain Location: headache, "legs" Pain Intervention(s): Monitored during session   Frequency  Min 2X/week        Progress Toward Goals  OT Goals(current goals can now be found in the care plan section)  Progress towards OT goals: Progressing toward goals  Acute Rehab OT Goals Patient Stated Goal: get back to bed OT Goal Formulation: With patient Time For Goal Achievement: 05/01/21 Potential to Achieve Goals: Good ADL Goals Pt Will Perform Grooming: with modified independence;standing Pt Will Perform Upper Body Dressing: sitting;Independently Pt Will Perform Lower Body Dressing: with modified independence;sit to/from stand Pt Will Transfer to Toilet: with modified independence;ambulating Additional ADL Goal #1: Pt will indep recall at least 3 fall prevention strategies to apply to the home setting  Plan Discharge plan remains appropriate       AM-PAC OT "6 Clicks" Daily Activity     Outcome Measure   Help from another person eating meals?: None Help from another person taking care of personal grooming?: A Little Help from another person toileting, which includes using toliet, bedpan, or urinal?: A Little Help from another person bathing (including washing, rinsing, drying)?: A Little Help from another person to put on and  taking off regular upper body clothing?: A Little Help from another person to put on and taking off regular lower body clothing?: A Little 6 Click Score: 19    End of Session Equipment Utilized During Treatment: Gait belt;Rolling walker (2 wheels)  OT Visit Diagnosis: Other abnormalities of gait and mobility (R26.89);Unsteadiness on feet (R26.81);Repeated falls (R29.6);Muscle weakness (generalized) (M62.81);History of falling (Z91.81)   Activity Tolerance Patient tolerated treatment well   Patient Left in bed;with call bell/phone within reach;with bed alarm set;with restraints reapplied   Nurse Communication Mobility status        Time: 1420-1440 OT Time Calculation (min): 20 min  Charges: OT General Charges $OT Visit: 1 Visit OT Treatments $Therapeutic Activity: 8-22 mins   Mindy Young A Jazziel Fitzsimmons 04/20/2021, 2:42 PM

## 2021-04-20 NOTE — Assessment & Plan Note (Signed)
Right old face laceration.  ENT consulted for further evaluation.  Clean with hydrogen peroxide once per day and saline gauze to the wound twice per day.  Added ancef for wound coverage, will transition to keflex today.

## 2021-04-20 NOTE — Assessment & Plan Note (Signed)
Magnesium level around 1.4 this morning.  4 g of magnesium sulfate ordered repeat levels in the morning.

## 2021-04-20 NOTE — Assessment & Plan Note (Signed)
Persistent and is the reason for recurrent admissions.  On alcohol withdrawal protocol.  She is more alert today and answering simple questions. Adamant about going home.  Her PCP called and said he is worried of her well being. She is not safe for discharge home. She woke up this morning and requested to go to [facility] store to get alcohol. She reports the alcohol rehab program is not working for her. She refused SNF placement.  psychiatry consulted for capacity evaluation for disposition.  Unable to reach the daughter in [place]. Unable to leave a voicemail.

## 2021-04-20 NOTE — Progress Notes (Signed)
[facility]                                                                               Mindy Young, is a 76 y.o. female, DOB - [birth year], NWG:[id number] Admit date - 04/15/2021    Outpatient Primary MD for the patient is Loura Pardon, MD  LOS - 5  days    Brief summary   Mindy Young is a 76 y.o. Caucasian female with medical history significant for alcohol abuse, anxiety, depression, GERD, seizure disorder and previous SDH, hypertension, tardive dyskinesia and breast cancer status post lumpectomy, who presented to the emergency room with acute onset of recurrent falls.   Head CT scan revealed acute subdural hemorrhage layering along the left tentorial leaflet that is 3 mm thick and along the right cerebral convexity that is 2 mm thick with no significant mass effect.  Maxillofacial CT which was motion limited without evidence of fracture. C-spine CT showed no evidence for acute fracture or traumatic malalignment.  It showed severe multilevel degenerative change and emphysema.  Repeat CT head showed Enlarging low-attenuation subdural hematoma along the left cerebral convexity resulting in increasing mass effect upon the left cerebral hemisphere and developing 4 mm left-to-right midline shift. Stable 3 mm subdural hematoma along the left tentorium. On call physician Dr Cyd Silence discussed with NS and  Suggested that these findings are chronic. Recommended outpatient follow up with Dr Reatha Armour in 2 weeks and a CT head ordered.              Pt had multiple falls on 2/26, confused and in withdrawals. Sitter ordered . Restraints ordered. She has multiple  wounds/ lacerations and wound on the legs and arms. Unable to reach the daughter to update the family.  Discussed the therapy evaluations with the patient and recommended SNF. Pt refused SNF.  Discussed her PCP conversations with the patient, and the next fall could be fatal and that she is not safe for discharge home.   Requested psychiatry consultation for capacity evaluation for disposition.      Assessment & Plan    Assessment and Plan: * Subdural hematoma- (present on admission) Subdural hematomas both acute and chronic Probably secondary to falls from alcohol intoxication. Repeat CT ordered and neurosurgery consulted and recommendations given.  No further intervention at this time.  Patient has facial trauma and a facial laceration, has been more than 24 hours.  Dr. Janace Hoard consulted for further evaluation of facial laceration. Therapy evaluations ordered. Recommending home health PT/OT.   CT head which was ordered on admissions was done the night of 2/25. Enlarging low-attenuation subdural hematoma along the left cerebral convexity resulting in increasing mass effect upon the left cerebral hemisphere and developing 4 mm left-to-right midline shift. Stable 3 mm subdural hematoma along the left tentorium. On call physician Dr Cyd Silence discussed with NS and  Suggested that these findings are chronic. Recommended outpatient follow up with Dr Reatha Armour in 2 weeks and a CT head ordered.   Pt had multiple falls on 2/26 due to confusion and she is active withdrawals.Kennon Holter ordered  Restraints ordered. She has multiple  wounds/ lacerations and wound on the legs and  arms. Unable to reach the daughter to update the family.   X rays of the knees and wrist negative for acute fractures.     Face lacerations- (present on admission) Right old face laceration.  ENT consulted for further evaluation.  Clean with hydrogen peroxide once per day and saline gauze to the wound twice per day.  Added ancef for wound coverage, will transition to keflex today.   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  On alcohol withdrawal protocol.  She is more alert today and answering simple questions. Adamant about going  home.  Her PCP called and said he is worried of her well being. She is not safe for discharge home. She woke up this morning and requested to go to [facility] store to get alcohol. She reports the alcohol rehab program is not working for her. She refused SNF placement.  psychiatry consulted for capacity evaluation for disposition.  Unable to reach the daughter in [place]. Unable to leave a voicemail.      Hypomagnesemia- (present on admission) Magnesium level around 1.4 this morning.  4 g of magnesium sulfate ordered repeat levels in the morning.  Seizure ([place]) No seizure episodes since admission As per pharmacy patient is not taking phenobarbital.  Continue with Keppra 1000 mg twice daily.   Hyponatremia- (present on admission) Persistent and chronic.  Secondary to chronic alcohol abuse. 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  Sodium has dropped to 127 today and she reports being dizzy earlier this am.   Continue to monitor  Hypertension- (present on admission) BP parameters are better controlled with losartan 50 mg daily.   COPD (chronic obstructive pulmonary disease) ([place])- (present on admission) No wheezing heard on exam.   GERD (gastroesophageal reflux disease)- (present on admission) Stable.   Anemia- (present on admission) Macrocytic,. Hemoglobin stable around 10.  Continue with vitamin b12 supplementation.        Code Status: full code.  DVT Prophylaxis:  SCDs Start: 04/15/21 1935   Level of Care: Level of care: Progressive Family Communication: none at bedside.   Disposition Plan:     Remains inpatient appropriate:  Persistent hyponatremia. IV antibiotics, alcohol withdrawal. Unsafe d/c , will need SNF.   Procedures:  None.   Consultants:   ENT PALLIATIVE CARE Psychiatry   Antimicrobials:   Anti-infectives (From admission, onward)    Start     Dose/Rate Route Frequency Ordered Stop   04/16/21 1630  ceFAZolin (ANCEF) IVPB 1 g/50 mL premix        1 g 100 mL/hr  over 30 Minutes Intravenous Every 8 hours 04/16/21 1538          Medications  Scheduled Meds:  folic acid  1 mg Oral Daily   levETIRAcetam  1,000 mg Oral BID   losartan  50 mg Oral Daily   [START ON 04/21/2021] magnesium oxide  400 mg Oral BID   melatonin  10 mg Oral QHS   metoprolol succinate  50 mg Oral Daily   multivitamin with minerals  1 tablet Oral Daily   neomycin-bacitracin-polymyxin   Topical BID   nicotine  14 mg Transdermal Daily   oxybutynin  10 mg Oral q morning   sodium chloride flush  10-40 mL Intracatheter Q12H   vitamin B-12  3,000 mcg Oral q morning   Continuous Infusions:   ceFAZolin (ANCEF) IV 1 g (04/20/21 0811)   magnesium sulfate bolus IVPB  thiamine injection     PRN Meds:.acetaminophen **OR** acetaminophen, albuterol, diphenoxylate-atropine, LORazepam, LORazepam **OR** [PENDING] LORazepam **OR** LORazepam **OR** [PENDING] LORazepam, magnesium hydroxide, sodium chloride flush, traZODone    Subjective:   Mindy Young was seen and examined today.  Pt alert, answering questions and in chair,  Refused SNF, reports alcohol rehab does not work for her.   Objective:   Vitals:   04/20/21 0324 04/20/21 0723 04/20/21 0925 04/20/21 1127  BP: 119/69  123/78 (!) 152/101  Pulse: 75  75 81  Resp: 18  16 20   Temp: (!) 97.3 F (36.3 C) (!) 97.5 F (36.4 C)  (!) 97.5 F (36.4 C)  TempSrc: Axillary Oral  Oral  SpO2: 98%  96% 97%  Weight:      Height:        Intake/Output Summary (Last 24 hours) at 04/20/2021 1150 Last data filed at 04/20/2021 3244 Gross per 24 hour  Intake --  Output 1500 ml  Net -1500 ml   Filed Weights   04/15/21 1451  Weight: 61.2 kg     Exam General exam: Ill-appearing disheveled elderly lady does not appear to be in any kind of distress Respiratory system: Clear to auscultation. Respiratory effort normal. Cardiovascular system: S1 & S2 heard, RRR. No JVD, No pedal edema. Gastrointestinal system: Abdomen is nondistended,  soft and nontender.  Normal bowel sounds heard. Central nervous system: Alert and oriented to person and place only Extremities: Symmetric 5 x 5 power. Skin: Bruising over the upper and lower extremities, facial lacerations Psychiatry: flat affect.    Data Reviewed:  I have personally reviewed following labs and imaging studies   CBC Lab Results  Component Value Date   WBC 4.5 04/19/2021   RBC 2.83 (L) 04/19/2021   HGB 10.0 (L) 04/19/2021   HCT 28.6 (L) 04/19/2021   MCV 101.1 (H) 04/19/2021   MCH 35.3 (H) 04/19/2021   PLT 188 04/19/2021   MCHC 35.0 04/19/2021   RDW 13.3 04/19/2021   LYMPHSABS 0.8 04/15/2021   MONOABS 1.0 04/15/2021   EOSABS 0.0 04/15/2021   BASOSABS 0.0 02/23/7251     Last metabolic panel Lab Results  Component Value Date   NA 127 (L) 04/20/2021   K 3.7 04/20/2021   CL 91 (L) 04/20/2021   CO2 26 04/20/2021   BUN 6 (L) 04/20/2021   CREATININE 0.63 04/20/2021   GLUCOSE 131 (H) 04/20/2021   GFRNONAA >60 04/20/2021   GFRAA >60 12/13/2018   CALCIUM 8.6 (L) 04/20/2021   PHOS 4.3 04/20/2021   PROT 5.1 (L) 04/15/2021   ALBUMIN 3.0 (L) 04/15/2021   BILITOT 0.6 04/15/2021   ALKPHOS 75 04/15/2021   AST 48 (H) 04/15/2021   ALT 40 04/15/2021   ANIONGAP 10 04/20/2021    CBG (last 3)  No results for input(s): GLUCAP in the last 72 hours.     Coagulation Profile: No results for input(s): INR, PROTIME in the last 168 hours.   Radiology Studies: DG Wrist 2 Views Left  Result Date: 04/18/2021 CLINICAL DATA:  Trauma, pain EXAM: LEFT WRIST - 2 VIEW COMPARISON:  None. FINDINGS: No recent fracture or dislocation is seen. Degenerative changes are noted in the radiocarpal joint. IMPRESSION: No recent fracture or dislocation is seen. Degenerative changes are noted. Electronically Signed   By: Elmer Picker M.D.   On: 04/18/2021 17:07   DG Wrist 2 Views Right  Result Date: 04/18/2021 CLINICAL DATA:  Trauma, pain EXAM: RIGHT WRIST - 2 VIEW COMPARISON:   None.  FINDINGS: No recent fracture or dislocation is seen. Degenerative changes are noted in the first metacarpophalangeal joint. There are faint calcifications in the soft tissues adjacent to distal ulna, possibly cartilage or ligament calcification. IMPRESSION: No recent fracture or dislocation is seen in the right wrist. Degenerative changes are noted in right first metacarpophalangeal joint. Electronically Signed   By: Elmer Picker M.D.   On: 04/18/2021 17:08   DG Knee 1-2 Views Left  Result Date: 04/18/2021 CLINICAL DATA:  Trauma, pain EXAM: LEFT KNEE - 1-2 VIEW COMPARISON:  03/30/2021 FINDINGS: No recent fracture or dislocation is seen. There is no significant effusion. Degenerative changes are noted with joint space narrowing and bony spurs in the medial compartment. Bony spurs are noted in the lateral and patellofemoral compartments along with meniscal cartilage calcifications. IMPRESSION: No recent fracture is seen. Degenerative changes are noted, more severe in the medial compartment. Electronically Signed   By: Elmer Picker M.D.   On: 04/18/2021 17:10   DG Knee 1-2 Views Right  Result Date: 04/18/2021 CLINICAL DATA:  Trauma, pain EXAM: RIGHT KNEE - 1-2 VIEW COMPARISON:  None. FINDINGS: No recent fracture or dislocation is seen. Degenerative changes are noted with bony spurs and calcifications in the meniscal cartilages. There is no significant effusion. IMPRESSION: No fracture or dislocation is seen in the right knee. Degenerative changes are noted with bony spurs. Electronically Signed   By: Elmer Picker M.D.   On: 04/18/2021 17:09        Hosie Poisson M.D. [facility] 04/20/2021, 11:50 AM  Available via Epic secure chat 7am-7pm After 7 pm, please refer to night coverage provider listed on amion.

## 2021-04-20 NOTE — Assessment & Plan Note (Signed)
BP parameters are better controlled with losartan 50 mg daily.

## 2021-04-20 NOTE — Progress Notes (Signed)
Speech Language Pathology Treatment: Dysphagia  Patient Details Name: Mindy Young MRN: [id number] DOB: [birth year] Today's Date: 04/20/2021 Time: 1833-5825 SLP Time Calculation (min) (ACUTE ONLY): 16 min  Assessment / Plan / Recommendation Clinical Impression  Pt seen to address dysphagia goals - Establishing baseline function completed - which pt advises she eats rapidly and occasionally "strangles" withh liquids.  Pt benefits from verbal cues to slow rate of eating.  No indication of aspiration/concerns for laryngeal penetration with all po observed including eggs;sausage;milk; water, and orange juice.  She consumed sequential boluses of thin water without s/s of aspiration/dysphagia with timely clinical swallow.  SLP observed pt to drink liquids to orally transit solids- and SLP educated pt that this technique may contribute to aspiration episodes.  Recommended she use purees or slightly thicker consistencies to facilitate oral clearance using teach back and written instructions posted for visual cues.  Pt with poor intake, which she advises is her baseline.  Suspect her exacerbation of chronic swallow dysfunction has resolved given improved mentation.  Advise to advance to regular/thin diet with precautions in place.  All education completed, Vital remain stable and pt is oriented x3 today.  Thank you.    HPI HPI: Pt is a 76 y.o. female who presented to the ED with acute onset of recurrent falls. CT head 2/23: Acute subdural hemorrhage layering along the left  tentorium, measuring up to 3 mm in thickness. PMH: alcohol abuse, anxiety, depression, GERD, seizure disorder and previous SDH, hypertension, tardive dyskinesia and breast cancer status post lumpectomy.      SLP Plan  All goals met      Recommendations for follow up therapy are one component of a multi-disciplinary discharge planning process, led by the attending physician.  Recommendations may be updated based on patient status,  additional functional criteria and insurance authorization.    Recommendations  Diet recommendations: Regular;Thin liquid Liquids provided via: Cup;Straw Medication Administration: Whole meds with puree Supervision: Full supervision/cueing for compensatory strategies;Intermittent supervision to cue for compensatory strategies Compensations: Slow rate;Small sips/bites;Minimize environmental distractions Postural Changes and/or Swallow Maneuvers: Seated upright 90 degrees                Oral Care Recommendations: Oral care BID SLP Visit Diagnosis: Dysphagia, unspecified (R13.10) Plan: All goals met         Kathleen Lime, MS [facility] SLP Acute Rehab Services Office [phone] Pager [phone]   Macario Golds  04/20/2021, 9:06 AM

## 2021-04-20 NOTE — Assessment & Plan Note (Signed)
Subdural hematomas both acute and chronic Probably secondary to falls from alcohol intoxication. Repeat CT ordered and neurosurgery consulted and recommendations given.  No further intervention at this time.  Patient has facial trauma and a facial laceration, has been more than 24 hours.  Dr. Janace Hoard consulted for further evaluation of facial laceration. Therapy evaluations ordered. Recommending home health PT/OT.   CT head which was ordered on admissions was done the night of 2/25. Enlarging low-attenuation subdural hematoma along the left cerebral convexity resulting in increasing mass effect upon the left cerebral hemisphere and developing 4 mm left-to-right midline shift. Stable 3 mm subdural hematoma along the left tentorium. On call physician Dr Cyd Silence discussed with NS and  Suggested that these findings are chronic. Recommended outpatient follow up with Dr Reatha Armour in 2 weeks and a CT head ordered.   Pt had multiple falls on 2/26 due to confusion and she is active withdrawals.Kennon Holter ordered  Restraints ordered. She has multiple  wounds/ lacerations and wound on the legs and arms. Unable to reach the daughter to update the family.   X rays of the knees and wrist negative for acute fractures.

## 2021-04-20 NOTE — Progress Notes (Signed)
Physical Therapy Treatment Patient Details Name: Mindy Young MRN: [id number] DOB: [birth year] Today's Date: 04/20/2021   History of Present Illness 76 yo female presented to ED with head trauma resulting from a fall while intoxicated. Pt was found to have Hyponatremia.  Pt has history of falls, at least 3 in the past 2 weeks where she presented to the ED according to he chart. PMHx:  alcohol abuse, anxiety, depression, GERD, seizure disorder and previous SDH, hypertension, tardive dyskinesia, and breast cancer status post lumpectomy, UTI,    PT Comments    Pt progressing functionally and was able to amb a short distance today with RW however remains severely impaired from cognitive stand point. Pt with noted difficulty sequencing steps to toliet and wash hands requiring max, constant multimodal cues. Pt perseverating on being dizzy and not feeling well. Pt able to track PTs finger with eyes only without saccadic movements or nystagmus but very unsteady/ataxic requiring RW for safe ambulation. Pt requires max encouragement to stay up and not return to bed. Acute PT to cont to follow.    Recommendations for follow up therapy are one component of a multi-disciplinary discharge planning process, led by the attending physician.  Recommendations may be updated based on patient status, additional functional criteria and insurance authorization.  Follow Up Recommendations  Skilled nursing-short term rehab (<3 hours/day)     Assistance Recommended at Discharge Frequent or constant Supervision/Assistance  Patient can return home with the following A lot of help with walking and/or transfers;A lot of help with bathing/dressing/bathroom;Assistance with cooking/housework;Direct supervision/assist for financial management;Assistance with feeding;Assist for transportation;Direct supervision/assist for medications management;Help with stairs or ramp for entrance   Equipment Recommendations  Rolling walker (2  wheels)    Recommendations for Other Services       Precautions / Restrictions Precautions Precautions: Fall Restrictions Weight Bearing Restrictions: No     Mobility  Bed Mobility               General bed mobility comments: pt sitting up in chair upon PT arrival    Transfers Overall transfer level: Needs assistance Equipment used: None Transfers: Sit to/from Stand Sit to Stand: Min assist           General transfer comment: pt initiated standing up, slow, cautious/guarded    Ambulation/Gait Ambulation/Gait assistance: Min assist Gait Distance (Feet): 15 Feet Assistive device: Rolling walker (2 wheels) Gait Pattern/deviations: Step-through pattern, Ataxic, Decreased stance time - right Gait velocity: slow Gait velocity interpretation: <1.8 ft/sec, indicate of risk for recurrent falls   General Gait Details: pt shaky, minA for stability. pt stopped stating "I really dont feel well. I'm so dizzy."   Stairs             Wheelchair Mobility    Modified Rankin (Stroke Patients Only)       Balance Overall balance assessment: Needs assistance Sitting-balance support: Feet supported Sitting balance-Leahy Scale: Fair     Standing balance support: Single extremity supported, During functional activity Standing balance-Leahy Scale: Poor Standing balance comment: pt did attempt to perform hygiene s/p BM in standing, L UE on walker, minA to steady, pt with set up and given verbal cues to perform task, pt stood at sink to wash hands without proping self up on sink, again max directional verbal cues to complete task                            Cognition Arousal/Alertness:  Awake/alert Behavior During Therapy: Flat affect Overall Cognitive Status: Impaired/Different from baseline Area of Impairment: Orientation, Attention, Memory, Following commands, Safety/judgement, Awareness, Problem solving                 Orientation Level:  Disoriented to, Time Current Attention Level: Focused Memory: Decreased short-term memory, Decreased recall of precautions Following Commands: Follows one step commands inconsistently, Follows one step commands with increased time Safety/Judgement: Decreased awareness of safety, Decreased awareness of deficits Awareness: Emergent Problem Solving: Slow processing, Decreased initiation, Requires verbal cues General Comments: pt doesn't remember seeing PT yesterday or that her caregiver [name] and dog Calton Golds was here yesterday. Pt with difficulty sequencing/processing tasks asked, pt repetitive, STM deficit        Exercises      General Comments General comments (skin integrity, edema, etc.): VSS on RA      Pertinent Vitals/Pain Pain Assessment Pain Assessment: Faces Faces Pain Scale: Hurts little more Pain Location: headache Pain Intervention(s): Monitored during session    Home Living                          Prior Function            PT Goals (current goals can now be found in the care plan section) Acute Rehab PT Goals PT Goal Formulation: With patient Time For Goal Achievement: 04/30/21 Potential to Achieve Goals: Fair Progress towards PT goals: Progressing toward goals    Frequency    Min 3X/week      PT Plan Current plan remains appropriate    Co-evaluation              AM-PAC PT "6 Clicks" Mobility   Outcome Measure  Help needed turning from your back to your side while in a flat bed without using bedrails?: A Little Help needed moving from lying on your back to sitting on the side of a flat bed without using bedrails?: A Little Help needed moving to and from a bed to a chair (including a wheelchair)?: A Little Help needed standing up from a chair using your arms (e.g., wheelchair or bedside chair)?: A Little Help needed to walk in hospital room?: A Lot Help needed climbing 3-5 steps with a railing? : Total 6 Click Score: 15    End of  Session Equipment Utilized During Treatment: Gait belt Activity Tolerance: Patient limited by fatigue Patient left: with call bell/phone within reach;in chair;with chair alarm set;with nursing/sitter in room Nurse Communication: Mobility status PT Visit Diagnosis: Unsteadiness on feet (R26.81);Other symptoms and signs involving the nervous system (R29.898);Other abnormalities of gait and mobility (R26.89)     Time: 9935-7017 PT Time Calculation (min) (ACUTE ONLY): 25 min  Charges:  $Gait Training: 8-22 mins $Therapeutic Activity: 8-22 mins                     Kittie Plater, PT, DPT Acute Rehabilitation Services Pager #: [phone] Office #: [phone]    Berline Lopes 04/20/2021, 12:47 PM

## 2021-04-20 NOTE — Assessment & Plan Note (Signed)
Persistent and chronic.  Secondary to chronic alcohol abuse. 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  Sodium has dropped to 127 today and she reports being dizzy earlier this am.   Continue to monitor

## 2021-04-20 NOTE — Consult Note (Signed)
[facility] New Face-to-Face Psychiatric Evaluation   Service Date: April 20, 2021 LOS: 5  Assessment  Mindy Young is a 76 y.o. female admitted medically for 04/15/2021  2:40 PM for recurrent falls and associated trauma and was found to have a subdural hemorrhage on imaging. She carries the psychiatric diagnoses of AUD, anxiety, depression, ?TD and has a past medical history of breast cancer status post lumpectomy, history of seizures, HTN.  Psychiatry was consulted for capacity by Hosie Poisson, MD.  In an evaluation of capacity, each of the following criteria must be met based on medical necessity in order for a patient to have capacity to make the decision in question. Of note, the capacity evaluation assesses only for the specified decision documented above and is not a determination of the patient's overall competency, which can only be adjudicated.   Criterion 1: The patient demonstrates a clear and consistent voluntary choice with regard to treatment options. Yes, does not want to go to SNF  Criterion 2: The patient adequately understands the disease they have, the treatment proposed, the risks of treatment, and the risks of other treatment (including no treatment). No, patient declined to engage in this conversation  Criterion 3: The patient acknowledges that the details of Criterion 2 apply to them specifically and the likely consequences of treatment options proposed. No, patient declined to engage in this conversation  Criterion 4: The patient demonstrates adequate reasoning/rationality within the context of their decision and can provide justification for their choice. No, patient declined to engage in this conversation  In this case, the patient does not have capacity to decline going to SNF once medically stable. See patient interview below for details.  Please see plan below for detailed recommendations.  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 This decision is based on my review of the chart including patient's history and current presentation, interview of the patient, mental status examination, and consideration of suicide risk including evaluating suicidal ideation, plan, intent, suicidal or self-harm behaviors, risk factors, and protective factors. This judgment is based on our ability to directly address suicide risk, implement suicide prevention strategies and develop a safety plan while the patient is in the clinical setting. Please contact our team if there is a concern that risk level has changed.   ## Medications:  --N/A  ## Medical Decision Making Capacity:  --Patient lacks capacity  ## Further Work-up:  -- Per primary team  -- Most recent EKG on Thursday April 15 2021 14:50:12 EST had QTc 437, HR 90    ## Disposition:  -- Patient does not meet requirement for inpatient psychiatric hospitalization -- Per primary team pending clinical improvement  ## Behavioral / Environmental:  -- 1:1 Observations -- Delirium and fall precautions   Thank you for this consult request. Recommendations have been communicated to the primary team.  We will follow at  this time.   Merrily Brittle, DO  NEW history  Relevant Aspects of Hospital Course:  Admitted on 04/15/2021 for Subdural hematoma.  Patient Report:  Patient was evaluated with attending today.  Patient was initially seen sleeping in bed and was guarded and mostly engaged during encounter. Patient exhibited concrete and delayed thought process and poor concentration and attention.  Pt seen in afternoon, awoken from nap. She states she is doing "OK" today and is not in pain. She is sleeping well. Feels like sleep is restful but is mostly catching naps during the day with poor sleep at night. Has "a million things on my mind and can't go to sleep". Has been like that for "months". She does know that she is in the hospital after a fall where she hurt her head. She does not know what caused the fall at home. States she has been falling "a lot" at home and is "really weak and prone to fall real easy".  We discussed the recommendation to go to physical rehab - she states that no one has talked to her about this yet this hospitalization (Dr. Karleen Hampshire had talked this AM prior to placing capacity consult). Describes her mood as "sucky"; has been diagnosed with anxiety in the past. Not on any meds for depression or anxiety to her knowledge. No hx of being on zoloft or prozac. Has been on Rexulti in the past (TD) - sx have somewhat improved since diagnosed but still occasionally present. She is interested in quitting drinking, started acamprosate. Drinks to help her sleep - can't sleep at night unless she takes "a couple of shots".  Unable to discuss any specifics of the worries that keep her up at night.   Has been seeing a psychiatrist for a couple of months. Was last in rehab "back in February" - was there for 2-3 months.   Oriented to self - gives year 1923 but self-corrects, month but unsure of today's date. Aware that it is toward the end of February. Oriented to city, state, and situation. Not able to do DOWB with multiple attempts. Able to count from 10 to 1.    Patient denied SI/HI/AVH. Patient was not grossly responding to internal/external stimuli during encounter.  Patient was amenable to the plan  discussed and had no other concerns.  ROS:  Patient denied symptoms of psychosis including AVH, delusions, paranoia, or first rank symptoms.   Exam was abrupted due to patient having to use the restroom, and tried to stand despite gait instability  Collateral information:  Deferred  Psychiatric History:  Information collected from chart review  Past psych diagnoses: Reported anxiety and depression Prior inpatient treatment: Denied Suicide attempts: Denied Psychiatric med trials: Declined to answer Current outpatient psychiatrist: Reported seeing psychiatrist for years Current outpatient therapist: Unable to inquire  Family psych history: Deferred   Social History: Deferred  Living Arrangements: Alone Available Help at Discharge: Personal care attendant; Available 24 hours/day Type of Home: House Home Layout: One level Home Access: Stairs to enter Entrance Stairs-Rails: Right Entrance Stairs-Number of Steps: 6 Bathroom Toilet: Standard Additional Comments: pt reports she had an injury and had all the equipment from then,   Substance Abuse History Per chart, patient reports that she has been smoking cigarettes. She has been smoking an average of 1 pack per day. She has never used smokeless tobacco. She reports current alcohol use of about 21.0 standard drinks per week. She reports that she does not use drugs.    Family  History:  The patient's family history includes Breast cancer in her maternal aunt; Breast cancer (age of onset: 41) in an other family member; Breast cancer (age of onset: 60) in her sister; COPD in her mother; Colon polyps in her father; Prostate cancer (age of onset: 37) in her father.  Medical History: Past Medical History:  Diagnosis Date   Alcohol abuse    Allergy    Anemia 06/29/2011   Anxiety    Arthritis    Breast cancer ([place])    right breast   C1 cervical fracture ([place]) 02/1997   Cancer ([place])    melanoma   Depression    Family history of  breast cancer    Family history of prostate cancer    GERD (gastroesophageal reflux disease)    Hypertension    Tardive dyskinesia     Surgical History: Past Surgical History:  Procedure Laterality Date   ABDOMINAL HYSTERECTOMY     ASPIRATION OF ABSCESS Right 11/20/2017   Procedure: ASPIRATION OF RIGHT AXILLARY SEROMA;  Surgeon: Rolm Bookbinder, MD;  Location: [place];  Service: General;  Laterality: Right;   AUGMENTATION MAMMAPLASTY Bilateral    biateral implants , approx 2015   BREAST LUMPECTOMY Right 11/02/2017   re-ex 11-20-17   BREAST LUMPECTOMY WITH RADIOACTIVE SEED AND SENTINEL LYMPH NODE BIOPSY Right 11/02/2017   Procedure: BREAST LUMPECTOMY WITH RADIOACTIVE SEED AND SENTINEL LYMPH NODE BIOPSY;  Surgeon: Rolm Bookbinder, MD;  Location: [place];  Service: General;  Laterality: Right;   BRONCHIAL WASHINGS  02/21/2021   Procedure: BRONCHIAL WASHINGS;  Surgeon: Candee Furbish, MD;  Location[facility] ENDOSCOPY;  Service: Pulmonary;;   CHOLECYSTECTOMY     collarbone     INCONTINENCE SURGERY     KNEE SURGERY     removal of cyst, repair of cartiledge   LAPAROSCOPY     for endometriosis   RE-EXCISION OF BREAST LUMPECTOMY Right 11/20/2017   Procedure: RE-EXCISION OF RIGHT BREAST MARGINS;  Surgeon: Rolm Bookbinder, MD;  Location: [place];  Service: General;  Laterality: Right;   ROTATOR CUFF REPAIR     left   VIDEO BRONCHOSCOPY Right 02/21/2021   Procedure: VIDEO BRONCHOSCOPY WITHOUT FLUORO;  Surgeon: Candee Furbish, MD;  Location: [facility] ENDOSCOPY;  Service: Pulmonary;  Laterality: Right;    Medications:   Current Facility-Administered Medications:    acetaminophen (TYLENOL) tablet 650 mg, 650 mg, Oral, Q6H PRN, 650 mg at 04/19/21 2034 **OR** acetaminophen (TYLENOL) suppository 650 mg, 650 mg, Rectal, Q6H PRN, Mansy, Jan A, MD   albuterol (PROVENTIL) (2.5 MG/3ML) 0.083% nebulizer solution 3 mL, 3 mL, Inhalation, Q4H PRN, Mansy, Jan  A, MD   cephALEXin (KEFLEX) capsule 500 mg, 500 mg, Oral, Q12H, Hosie Poisson, MD, 500 mg at 04/20/21 1321   diphenoxylate-atropine (LOMOTIL) 2.5-0.025 MG per tablet 1 tablet, 1 tablet, Oral, QID PRN, Mansy, Jan A, MD   folic acid (FOLVITE) tablet 1 mg, 1 mg, Oral, Daily, Mansy, Jan A, MD, 1 mg at 04/20/21 1050   levETIRAcetam (KEPPRA) tablet 1,000 mg, 1,000 mg, Oral, BID, Mansy, Jan A, MD, 1,000 mg at 04/20/21 1049   LORazepam (ATIVAN) injection 1 mg, 1 mg, Intravenous, Q1H PRN, Mansy, Jan A, MD, 1 mg at 04/15/21 2356   LORazepam (ATIVAN) tablet 1-4 mg, 1-4 mg, Oral, Q1H PRN, 1 mg at 04/20/21 1634 **OR** [PENDING] LORazepam (ATIVAN) tablet 1-4 mg, 1-4 mg, Oral, Q1H PRN **OR** LORazepam (ATIVAN) injection 1-4 mg, 1-4 mg, Intravenous, Q1H PRN, 2 mg  at 04/18/21 2153 **OR** [PENDING] LORazepam (ATIVAN) injection 1-4 mg, 1-4 mg, Intravenous, Q1H PRN,    losartan (COZAAR) tablet 50 mg, 50 mg, Oral, Daily, Hosie Poisson, MD, 50 mg at 04/20/21 1050   magnesium hydroxide (MILK OF MAGNESIA) suspension 30 mL, 30 mL, Oral, Daily PRN, Mansy, Jan A, MD   [START ON 04/21/2021] magnesium oxide (MAG-OX) tablet 400 mg, 400 mg, Oral, BID, Karleen Hampshire, Vijaya, MD   melatonin tablet 10 mg, 10 mg, Oral, QHS, Mansy, Jan A, MD, 10 mg at 04/19/21 2035   metoprolol succinate (TOPROL-XL) 24 hr tablet 50 mg, 50 mg, Oral, Daily, Mansy, Jan A, MD, 50 mg at 04/20/21 1050   multivitamin with minerals tablet 1 tablet, 1 tablet, Oral, Daily, Mansy, Jan A, MD, 1 tablet at 04/20/21 1050   neomycin-bacitracin-polymyxin (NEOSPORIN) ointment, , Topical, BID, Hosie Poisson, MD, Given at 04/20/21 1051   oxybutynin (DITROPAN-XL) 24 hr tablet 10 mg, 10 mg, Oral, q morning, Mansy, Jan A, MD, 10 mg at 04/20/21 1104   sodium chloride flush (NS) 0.9 % injection 10-40 mL, 10-40 mL, Intracatheter, Q12H, Mansy, Jan A, MD, 10 mL at 04/20/21 1054   sodium chloride flush (NS) 0.9 % injection 10-40 mL, 10-40 mL, Intracatheter, PRN, Mansy, Jan A, MD    traZODone (DESYREL) tablet 25 mg, 25 mg, Oral, QHS PRN, Mansy, Jan A, MD, 25 mg at 04/17/21 1950   vitamin B-12 (CYANOCOBALAMIN) tablet 3,000 mcg, 3,000 mcg, Oral, q morning, Mansy, Jan A, MD, 3,000 mcg at 04/20/21 1110  Allergies: Allergies  Allergen Reactions   Percocet [Oxycodone-Acetaminophen] Other (See Comments)    "bugs crawling on me"    Objective  Psychiatric Specialty Exam: Physical Exam Vitals and nursing note reviewed.  Constitutional:      General: She is awake. She is not in acute distress.    Appearance: She is not diaphoretic.  Pulmonary:     Effort: Pulmonary effort is normal. No respiratory distress.  Neurological:     Mental Status: She is alert.  Psychiatric:        Behavior: Behavior is cooperative.     Review of Systems  Neurological:  Positive for tremors.    Body mass index is 24.69 kg/m. Temp:  [97.3 F (36.3 C)-97.7 F (36.5 C)] 97.3 F (36.3 C) (02/28 1532) Pulse Rate:  [71-118] 76 (02/28 1532) Cardiac Rhythm: Normal sinus rhythm (02/28 0700) Resp:  [16-20] 20 (02/28 1532) BP: (119-166)/(69-101) 166/99 (02/28 1532) SpO2:  [96 %-100 %] 99 % (02/28 1532)  General Appearance: Disheveled (face laceration, multiple bruises on all extremities, right arm wound)    Eye Contact: Fair    Speech: Slow; Clear and Coherent    Volume: Normal    Mood: Dysphoric ("sucky")   Affect: Blunt    Thought Process: Coherent; Goal Directed (concrete)  Descriptions of Associations: Circumstantial  Duration of Psychotic Symptoms:No data recorded Past Diagnosis of Schizophrenia or Psychoactive disorder: No   Orientation: Full (Time, Place and Person)   Thought Content: Perseveration (Perseverates that she wants to go home, vague and avoidant phrases about why she doesn't need SNF)  Hallucinations: Hallucinations: None  Ideas of Reference: None   Suicidal Thoughts: No  Suicidal Thoughts: No  Homicidal Thoughts: No  Homicidal Thoughts:  No   Memory: Immediate Poor; Recent Poor    Judgement: Impaired  Insight: Lacking (Demonstrated by trying to stand up despite recurrent falls, multiple people in the room asking her to please remain seated)    Psychomotor Activity: Tremor (resting  tremor noted)    Concentration: Poor (Difficulty following conversation, repeatedly says yes)  Attention Span: Poor (can count backwards from 10, unable to DOWB.)   Recall: Poor    Fund of Knowledge: Poor    Language: Fair    Handed: Right    Assets: Leisure Time    Sleep: Poor     AIMS:   , ,  ,  ,     CIWA:CIWA-Ar Total: 0 COWS:      Signed: Merrily Brittle, DO Psychiatry Resident, PGY-1 [place] 04/20/2021, 6:46 PM

## 2021-04-20 NOTE — Assessment & Plan Note (Signed)
No seizure episodes since admission As per pharmacy patient is not taking phenobarbital.  Continue with Keppra 1000 mg twice daily.

## 2021-04-21 DIAGNOSIS — R4182 Altered mental status, unspecified: Secondary | ICD-10-CM

## 2021-04-21 DIAGNOSIS — Z0189 Encounter for other specified special examinations: Secondary | ICD-10-CM

## 2021-04-21 DIAGNOSIS — F1010 Alcohol abuse, uncomplicated: Secondary | ICD-10-CM | POA: Diagnosis not present

## 2021-04-21 DIAGNOSIS — S065XAA Traumatic subdural hemorrhage with loss of consciousness status unknown, initial encounter: Secondary | ICD-10-CM | POA: Diagnosis not present

## 2021-04-21 LAB — COMPREHENSIVE METABOLIC PANEL
ALT: 15 U/L (ref 0–44)
AST: 25 U/L (ref 15–41)
Albumin: 2.6 g/dL — ABNORMAL LOW (ref 3.5–5.0)
Alkaline Phosphatase: 78 U/L (ref 38–126)
Anion gap: 9 (ref 5–15)
BUN: 7 mg/dL — ABNORMAL LOW (ref 8–23)
CO2: 26 mmol/L (ref 22–32)
Calcium: 8.8 mg/dL — ABNORMAL LOW (ref 8.9–10.3)
Chloride: 95 mmol/L — ABNORMAL LOW (ref 98–111)
Creatinine, Ser: 0.66 mg/dL (ref 0.44–1.00)
GFR, Estimated: >60 mL/min (ref >60)
Glucose, Bld: 106 mg/dL — ABNORMAL HIGH (ref 70–99)
Potassium: 3.6 mmol/L (ref 3.5–5.1)
Sodium: 130 mmol/L — ABNORMAL LOW (ref 135–145)
Total Bilirubin: 0.2 mg/dL — ABNORMAL LOW (ref 0.3–1.2)
Total Protein: 5 g/dL — ABNORMAL LOW (ref 6.5–8.1)

## 2021-04-21 LAB — MAGNESIUM: Magnesium: 1.8 mg/dL (ref 1.7–2.4)

## 2021-04-21 MED ORDER — POTASSIUM CHLORIDE CRYS ER 20 MEQ PO TBCR
40.0000 meq | EXTENDED_RELEASE_TABLET | Freq: Once | ORAL | Status: AC
  Administered 2021-04-21: 40 meq via ORAL
  Filled 2021-04-21: qty 2

## 2021-04-21 MED ORDER — THIAMINE HCL 100 MG PO TABS
100.0000 mg | ORAL_TABLET | Freq: Every day | ORAL | Status: DC
Start: 2021-04-21 — End: 2021-04-28
  Administered 2021-04-21 – 2021-04-27 (×7): 100 mg via ORAL
  Filled 2021-04-21 (×7): qty 1

## 2021-04-21 NOTE — Progress Notes (Signed)
PROGRESS NOTE    Mindy Young  VCB:[id number] DOB: May 26, 1945 DOA: 04/15/2021 PCP: Loura Pardon, MD    Brief Narrative:  Mindy Young is a 76 y.o. Caucasian female with medical history significant for alcohol abuse, anxiety, depression, GERD, seizure disorder and previous SDH, hypertension, tardive dyskinesia and breast cancer status post lumpectomy, who presented to the emergency room with acute onset of recurrent falls.   Head CT scan revealed acute subdural hemorrhage layering along the left tentorial leaflet that is 3 mm thick and along the right cerebral convexity that is 2 mm thick with no significant mass effect.  Maxillofacial CT which was motion limited without evidence of fracture. C-spine CT showed no evidence for acute fracture or traumatic malalignment.  It showed severe multilevel degenerative change and emphysema.  Repeat CT head showed nnlarging low-attenuation subdural hematoma along the left cerebral convexity resulting in increasing mass effect upon the left cerebral hemisphere and developing 4 mm left-to-right midline shift, stable 3 mm subdural hematoma along the left tentorium. On call physician Dr Cyd Silence discussed with neurosurgery and suggested that these findings are chronic. Recommended outpatient follow up with Dr Reatha Armour in 2 weeks.  Hospital course also complicated by confusion and alcohol withdrawals.  Seen by PT and OT recommended SNF placement.  Patient currently not able to make her own medical decisions per psychiatry and given previous PCP conversations that her next fall could be fatal she is not safe for discharge home.      Assessment & Plan:   Assessment and Plan: * Subdural hematoma- (present on admission) Patient presenting to ED with confusion, following fall and was found to have acute and chronic subdural hematomas likely complicated by alcohol intoxication.  Was evaluated by neurosurgery with repeat CT head and no further intervention recommended  at this time; with outpatient follow-up with neurosurgery, Dr. Reatha Armour in 2 weeks.    Face lacerations- (present on admission) Patient with facial laceration to right side.  Seen by ENT, Dr. Janace Hoard; and given that this is likely an old facial laceration with eschar and exudate already formed in the base, recommend wound care, cleaning with hydroperoxide once a day and saline gauze to the wound twice a day.  Recommended antibiotic course for possible underlying infection and once wound heals can consider revision at that time. --Continue wound care --Keflex 500 mg p.o. twice daily  Hyponatremia- (present on admission) Etiology likely secondary to EtOH use disorder.  Not on a thiazide diuretic outpatient sodium 120 on admission. --Na 120>>127>130 --Continue to encourage EtOH cessation --Continue intermittent monitoring of sodium level   Seizure (HCC) No seizure episodes since admission, per pharmacy patient is not taking phenobarbital.  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 PCP called and said he is worried of her well being and currently is not safe for discharge home.  Was evaluated by psychiatry on 04/20/2021, currently does not have capacity for medical decision making. --Continue to encourage alcohol cessation       Alcohol abuse- (present on admission) EtOH level elevated 62 on admission. --Multivitamin, thiamine, folic acid --Continue encourage alcohol cessation  Hypertension- (present on admission) --Losartan 50 mg p.o. daily --Metoprolol succinate 50 mg p.o. daily  Anemia- (present on admission) Macrocytic,. Hemoglobin stable around 10.  --Continue vitamin b12 supplementation.   COPD (chronic obstructive pulmonary disease) ([place])- (present on admission) Stable, no acute exacerbation. --  Albuterol neb as  needed  Hypomagnesemia- (present on admission) Magnesium level around 1.8 this morning.  Will replete today. -- Repeat electrolytes including magnesium in a.m.     DVT prophylaxis: SCDs Start: 04/15/21 1935    Code Status: Full Code Family Communication: No family present at bedside this morning  Disposition Plan:  Level of care: Progressive Status is: Inpatient Remains inpatient appropriate because: Pending SNF placement     Consultants:  Neurosurgery, Dr. Reatha Armour Psychiatry ENT, Dr. Janace Hoard  Procedures:  None  Antimicrobials:  Keflex 2/28>> Ancef 2/24 - 2/28    Subjective: Patient seen examined bedside, resting comfortably.  Sitter present.  No specific complaints this morning.  Seen by psychiatry yesterday, does not have capacity for medical decision-making at this time.  Awaiting SNF placement.  No family present.  No other questions or concerns at this time.  Denies headache, no vision changes, no chest pain, no palpitations, no shortness of breath, no abdominal pain, no weakness, no fatigue, no paresthesias.  No acute events overnight per nursing staff.  Objective: Vitals:   04/21/21 0022 04/21/21 0306 04/21/21 0744 04/21/21 0745  BP: 136/81 (!) 158/93 (!) 178/104 (!) 157/81  Pulse: 78 73 79 75  Resp: 16 18    Temp: 97.7 F (36.5 C) 98.5 F (36.9 C) 97.8 F (36.6 C)   TempSrc: Oral Oral Oral   SpO2: 99% 100% 95% 98%  Weight:      Height:        Intake/Output Summary (Last 24 hours) at 04/21/2021 1039 Last data filed at 04/21/2021 0804 Gross per 24 hour  Intake 926.31 ml  Output 400 ml  Net 526.31 ml   Filed Weights   04/15/21 1451  Weight: 61.2 kg    Examination:  Physical Exam: GEN: NAD, alert and oriented x 3, chronically ill in appearance, appears older than stated age with multiple areas of ecchymosis, skin tears in various stages of healing HEENT:  PERRL, EOMI, sclera clear, MMM, noted right facial wound without erythema/fluctuance PULM: CTAB  w/o wheezes/crackles, normal respiratory effort, on room air CV: RRR w/o M/G/R GI: abd soft, NTND, NABS, no R/G/M MSK: no peripheral edema, muscle strength globally intact 5/5 bilateral upper/lower extremities NEURO: CN II-XII intact, no focal deficits, sensation to light touch intact PSYCH: Depressed mood, flat affect Integumentary: Multiple skin tears to extremities in various stages of healing, multiple areas of ecchymosis to extremities, noted right facial wound without erythema/fluctuance         Data Reviewed: I have personally reviewed following labs and imaging studies  CBC: Recent Labs  Lab 04/15/21 1458 04/16/21 0344 04/17/21 0119 04/18/21 0214 04/19/21 0308  WBC 7.0 5.7 4.5 5.3 4.5  NEUTROABS 5.1  --   --   --   --   HGB 10.1* 10.7* 10.5* 10.0* 10.0*  HCT 29.7* 29.4* 30.2* 28.7* 28.6*  MCV 103.5* 100.7* 102.7* 103.6* 101.1*  PLT 291 263 277 271 683   Basic Metabolic Panel: Recent Labs  Lab 04/16/21 0344 04/17/21 0119 04/18/21 0214 04/19/21 0308 04/20/21 0917 04/21/21 0123  NA 125* 127* 127* 130* 127* 130*  K 3.6 3.8 3.5 4.4 3.7 3.6  CL 86* 91* 92* 92* 91* 95*  CO2 27 23 23 26 26 26   GLUCOSE 86 99 130* 100* 131* 106*  BUN 10 5* 5* 6* 6* 7*  CREATININE 0.71 0.64 0.64 0.80 0.63 0.66  CALCIUM 8.1* 7.7* 8.2* 9.0 8.6* 8.8*  MG 1.9 1.3* 1.7 1.5* 1.4* 1.8  PHOS  3.6  --   --   --  4.3  --    GFR: Estimated Creatinine Clearance: 52.3 mL/min (by C-G formula based on SCr of 0.66 mg/dL). Liver Function Tests: Recent Labs  Lab 04/15/21 1458 04/21/21 0123  AST 48* 25  ALT 40 15  ALKPHOS 75 78  BILITOT 0.6 0.2*  PROT 5.1* 5.0*  ALBUMIN 3.0* 2.6*   No results for input(s): LIPASE, AMYLASE in the last 168 hours. No results for input(s): AMMONIA in the last 168 hours. Coagulation Profile: No results for input(s): INR, PROTIME in the last 168 hours. Cardiac Enzymes: No results for input(s): CKTOTAL, CKMB, CKMBINDEX, TROPONINI in the last 168 hours. BNP  (last 3 results) No results for input(s): PROBNP in the last 8760 hours. HbA1C: No results for input(s): HGBA1C in the last 72 hours. CBG: Recent Labs  Lab 04/15/21 2014  GLUCAP 80   Lipid Profile: No results for input(s): CHOL, HDL, LDLCALC, TRIG, CHOLHDL, LDLDIRECT in the last 72 hours. Thyroid Function Tests: No results for input(s): TSH, T4TOTAL, FREET4, T3FREE, THYROIDAB in the last 72 hours. Anemia Panel: No results for input(s): VITAMINB12, FOLATE, FERRITIN, TIBC, IRON, RETICCTPCT in the last 72 hours. Sepsis Labs: No results for input(s): PROCALCITON, LATICACIDVEN in the last 168 hours.  Recent Results (from the past 240 hour(s))  Resp Panel by RT-PCR (Flu A&B, Covid) Nasopharyngeal Swab     Status: None   Collection Time: 04/15/21  7:35 PM   Specimen: Nasopharyngeal Swab; Nasopharyngeal(NP) swabs in vial transport medium  Result Value Ref Range Status   SARS Coronavirus 2 by RT PCR NEGATIVE NEGATIVE Final    Comment: (NOTE) SARS-CoV-2 target nucleic acids are NOT DETECTED.  The SARS-CoV-2 RNA is generally detectable in upper respiratory specimens during the acute phase of infection. The lowest concentration of SARS-CoV-2 viral copies this assay can detect is 138 copies/mL. A negative result does not preclude SARS-Cov-2 infection and should not be used as the sole basis for treatment or other patient management decisions. A negative result may occur with  improper specimen collection/handling, submission of specimen other than nasopharyngeal swab, presence of viral mutation(s) within the areas targeted by this assay, and inadequate number of viral copies(<138 copies/mL). A negative result must be combined with clinical observations, patient history, and epidemiological information. The expected result is Negative.  Fact Sheet for Patients:  EntrepreneurPulse.com.au  Fact Sheet for Healthcare Providers:   IncredibleEmployment.be  This test is no t yet approved or cleared by the [place] FDA and  has been authorized for detection and/or diagnosis of SARS-CoV-2 by FDA under an Emergency Use Authorization (EUA). This EUA will remain  in effect (meaning this test can be used) for the duration of the COVID-19 declaration under Section 564(b)(1) of the Act, 21 U.S.C.section 360bbb-3(b)(1), unless the authorization is terminated  or revoked sooner.       Influenza A by PCR NEGATIVE NEGATIVE Final   Influenza B by PCR NEGATIVE NEGATIVE Final    Comment: (NOTE) The Xpert Xpress SARS-CoV-2/FLU/RSV plus assay is intended as an aid in the diagnosis of influenza from Nasopharyngeal swab specimens and should not be used as a sole basis for treatment. Nasal washings and aspirates are unacceptable for Xpert Xpress SARS-CoV-2/FLU/RSV testing.  Fact Sheet for Patients: EntrepreneurPulse.com.au  Fact Sheet for Healthcare Providers: IncredibleEmployment.be  This test is not yet approved or cleared by the [place] FDA and has been authorized for detection and/or diagnosis of SARS-CoV-2 by FDA under an Emergency Use  Authorization (EUA). This EUA will remain in effect (meaning this test can be used) for the duration of the COVID-19 declaration under Section 564(b)(1) of the Act, 21 U.S.C. section 360bbb-3(b)(1), unless the authorization is terminated or revoked.  Performed at [facility], [place] [address], [place], [place] [address]          Radiology Studies: No results found.      Scheduled Meds:  cephALEXin  500 mg Oral D17O   folic acid  1 mg Oral Daily   levETIRAcetam  1,000 mg Oral BID   losartan  50 mg Oral Daily   magnesium oxide  400 mg Oral BID   melatonin  10 mg Oral QHS   metoprolol succinate  50 mg Oral Daily   multivitamin with minerals  1 tablet Oral Daily   neomycin-bacitracin-polymyxin   Topical  BID   oxybutynin  10 mg Oral q morning   potassium chloride  40 mEq Oral Once   sodium chloride flush  10-40 mL Intracatheter Q12H   thiamine  100 mg Oral Daily   vitamin B-12  3,000 mcg Oral q morning   Continuous Infusions:   LOS: 6 days    Time spent: 42 minutes spent on chart review, discussion with nursing staff, consultants, updating family and interview/physical exam; more than 50% of that time was spent in counseling and/or coordination of care.    Taheerah Guldin J [place] DO [facility] Available via Epic secure chat 7am-7pm After these hours, please refer to coverage provider listed on [email or web address] 04/21/2021, 10:39 AM

## 2021-04-21 NOTE — Assessment & Plan Note (Addendum)
Losartan 100 mg p.o. daily, Metoprolol succinate 50 mg p.o. daily ?

## 2021-04-21 NOTE — Assessment & Plan Note (Signed)
Stable

## 2021-04-21 NOTE — Assessment & Plan Note (Addendum)
Stable, no acute exacerbation.  Advair/albuterol as needed ?

## 2021-04-21 NOTE — Assessment & Plan Note (Addendum)
No seizure episodes since admission, per pharmacy patient is not taking phenobarbital.  Continue Keppra 1000 mg BID.  Outpatient follow-up with neurology 4 weeks. ? ?

## 2021-04-21 NOTE — Assessment & Plan Note (Addendum)
Continues with alcohol use disorder, patient with recurrent admissions for such. PCP called and said he is worried of her well being and currently is not safe for discharge home.  Was evaluated by psychiatry on 04/20/2021, currently does not have capacity for medical decision making.  Seen by SLP for cognitive evaluation with score on SLUMS 12/30. Continue to encourage alcohol cessation ? ? ? ? ? ? ?

## 2021-04-21 NOTE — Assessment & Plan Note (Addendum)
Etiology likely secondary to EtOH use disorder.  Not on a thiazide diuretic outpatient sodium 120 on admission.  Continue sodium chloride 1 g p.o. twice daily.  Continue encourage alcohol cessation.  Sodium 127 at time of discharge.  Recommend repeat BMP 1 week. ? ?

## 2021-04-21 NOTE — NC FL2 (Signed)
?[place] MEDICAID FL2 LEVEL OF CARE SCREENING TOOL  ?  ? ?IDENTIFICATION  ?Patient Name: ?Mindy Young Birthdate: 11-Sep-1945 Sex: female Admission Date (Current Location): ?04/15/2021  ?[place] and [place] Number: ? [place] ?  Facility and Address:  ?[facility], [place] [address] [place], [place] [address] ?     Provider Number: ?[id number]  ?Attending Physician Name and Address:  ?[place] Eric J, DO ? Relative Name and Phone Number:  ?  ?   ?Current Level of Care: ?Hospital Recommended Level of Care: ?[place] Prior Approval Number: ?  ? ?Date Approved/Denied: ?  PASRR Number: ?[id number] ? ?Discharge Plan: ?SNF ?  ? ?Current Diagnoses: ?Patient Active Problem List  ? Diagnosis Date Noted  ? Encounter for assessment of decision-making capacity   ? Face lacerations 04/17/2021  ? Subdural hematoma 04/15/2021  ? Hypomagnesemia 02/20/2021  ? Elevated troponin 02/20/2021  ? COPD (chronic obstructive pulmonary disease) ([place]) 02/20/2021  ? Diarrhea 02/20/2021  ? Prolonged QT interval 02/20/2021  ? Lung neoplasm 02/20/2021  ? ETOH abuse 12/29/2020  ? Alcohol use disorder, severe, dependence ([place]) 12/29/2020  ? 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 Alcohol dependence with uncomplicated intoxication ([place])   ? Clavicle fracture 10/29/2020  ? Rib fracture 10/29/2020  ? Hypokalemia 06/14/2020  ? Seizure ([place]) 06/13/2020  ? Impingement syndrome of left shoulder 03/27/2019  ? Pain in left shoulder 02/21/2019  ? Rib pain on right side 02/21/2019  ? Low back pain 02/07/2019  ? Anxiety 12/09/2018  ? Difficulty speaking 12/09/2018  ? Leukocytosis 12/09/2018  ? Seizure-like activity ([place]) 12/09/2018  ? Pelvic fracture ([place]) 11/25/2018  ? Pubic bone fracture ([place]) 11/24/2018  ? Left knee pain 03/29/2018  ? Unilateral primary osteoarthritis, left knee 03/29/2018  ? Genetic testing 01/24/2018  ? Family history of breast cancer   ? Family history  of prostate cancer   ? Malignant neoplasm of lower-inner quadrant of right breast of female, estrogen receptor positive ([place]) 10/24/2017  ? Anemia 06/29/2011  ? Hypertension   ? Hyponatremia 06/25/2011  ? Transaminitis 06/25/2011  ? Alcohol abuse 06/25/2011  ? Cough 06/25/2011  ? Cigarette smoker 06/25/2011  ? ? ?Orientation RESPIRATION BLADDER Height & Weight   ?  ?Self, Time, Situation, Place ? Normal Incontinent Weight: 135 lb (61.2 kg) ?Height:  5\' 2"  (157.5 cm)  ?BEHAVIORAL SYMPTOMS/MOOD NEUROLOGICAL BOWEL NUTRITION STATUS  ?    Continent Diet (see DC summary)  ?AMBULATORY STATUS COMMUNICATION OF NEEDS Skin   ?Limited Assist Verbally Normal ?  ?  ?  ?    ?     ?     ? ? ?Personal Care Assistance Level of Assistance  ?Bathing, Feeding, Dressing Bathing Assistance: Limited assistance ?Feeding assistance: Limited assistance ?Dressing Assistance: Limited assistance ?   ? ?Functional Limitations Info  ?    ?  ?   ? ? ?SPECIAL CARE FACTORS FREQUENCY  ?PT (By licensed PT), OT (By licensed OT)   ?  ?PT Frequency: 5x/wk ?OT Frequency: 5x/wk ?  ?  ?  ?   ? ? ?Contractures Contractures Info: Not present  ? ? ?Additional Factors Info  ?Code Status, Allergies Code Status Info: Full ?Allergies Info: Percocet (Oxycodone-acetaminophen) ?  ?  ?  ?   ? ?Current Medications (04/21/2021):  This is the current hospital active medication list ?Current Facility-Administered Medications  ?Medication Dose Route Frequency Provider Last Rate Last Admin  ? acetaminophen (TYLENOL) tablet  650 mg  650 mg Oral Q6H PRN Mansy, Arvella Merles, MD   650 mg at 04/21/21 0310  ? Or  ? acetaminophen (TYLENOL) suppository 650 mg  650 mg Rectal Q6H PRN Mansy, Jan A, MD      ? albuterol (PROVENTIL) (2.5 MG/3ML) 0.083% nebulizer solution 3 mL  3 mL Inhalation Q4H PRN Mansy, Jan A, MD      ? cephALEXin (KEFLEX) capsule 500 mg  500 mg Oral Q12H Hosie Poisson, MD   500 mg at 04/21/21 0858  ? diphenoxylate-atropine (LOMOTIL) 2.5-0.025 MG per tablet 1 tablet  1 tablet  Oral QID PRN Mansy, Jan A, MD      ? folic acid (FOLVITE) tablet 1 mg  1 mg Oral Daily Mansy, Jan A, MD   1 mg at 04/21/21 7510  ? levETIRAcetam (KEPPRA) tablet 1,000 mg  1,000 mg Oral BID Mansy, Jan A, MD   1,000 mg at 04/21/21 0858  ? LORazepam (ATIVAN) injection 1 mg  1 mg Intravenous Q1H PRN Mansy, Jan A, MD   1 mg at 04/15/21 2356  ? LORazepam (ATIVAN) tablet 1-4 mg  1-4 mg Oral Q1H PRN Vernelle Emerald, MD   2 mg at 04/21/21 0310  ? Or  ? LORazepam (ATIVAN) injection 1-4 mg  1-4 mg Intravenous Q1H PRN Vernelle Emerald, MD   2 mg at 04/18/21 2153  ? losartan (COZAAR) tablet 50 mg  50 mg Oral Daily Hosie Poisson, MD   50 mg at 04/21/21 0858  ? magnesium hydroxide (MILK OF MAGNESIA) suspension 30 mL  30 mL Oral Daily PRN Mansy, Jan A, MD      ? magnesium oxide (MAG-OX) tablet 400 mg  400 mg Oral BID Hosie Poisson, MD   400 mg at 04/21/21 0858  ? melatonin tablet 10 mg  10 mg Oral QHS Mansy, Jan A, MD   10 mg at 04/20/21 2007  ? metoprolol succinate (TOPROL-XL) 24 hr tablet 50 mg  50 mg Oral Daily Mansy, Jan A, MD   50 mg at 04/21/21 2585  ? multivitamin with minerals tablet 1 tablet  1 tablet Oral Daily Mansy, Jan A, MD   1 tablet at 04/21/21 0858  ? neomycin-bacitracin-polymyxin (NEOSPORIN) ointment   Topical BID Hosie Poisson, MD   Given at 04/21/21 [phone]  ? oxybutynin (DITROPAN-XL) 24 hr tablet 10 mg  10 mg Oral q morning Mansy, Jan A, MD   10 mg at 04/21/21 2423  ? sodium chloride flush (NS) 0.9 % injection 10-40 mL  10-40 mL Intracatheter Q12H Mansy, Jan A, MD   10 mL at 04/20/21 2216  ? sodium chloride flush (NS) 0.9 % injection 10-40 mL  10-40 mL Intracatheter PRN Mansy, Jan A, MD      ? thiamine tablet 100 mg  100 mg Oral Daily [place] Donnamarie Poag, DO   100 mg at 04/21/21 1221  ? traZODone (DESYREL) tablet 25 mg  25 mg Oral QHS PRN Mansy, Jan A, MD   25 mg at 04/17/21 1950  ? vitamin B-12 (CYANOCOBALAMIN) tablet 3,000 mcg  3,000 mcg Oral q morning Mansy, Jan A, MD   3,000 mcg at 04/21/21 5361   ? ? ? ?Discharge Medications: ?Please see discharge summary for a list of discharge medications. ? ?Relevant Imaging Results: ? ?Relevant Lab Results: ? ? ?Additional Information ?SS#: [id number] ? ?Geralynn Ochs, LCSW ? ? ? ? ?

## 2021-04-21 NOTE — Assessment & Plan Note (Addendum)
EtOH level elevated 62 on admission. Multivitamin, thiamine, folic acid. Continue encourage alcohol cessation ?

## 2021-04-21 NOTE — Assessment & Plan Note (Addendum)
Patient with facial laceration to right side.  Seen by ENT, Dr. Janace Hoard; and given that this is likely an old facial laceration with eschar and exudate already formed in the base, recommend wound care, cleaning with hydroperoxide once a day and saline gauze to the wound twice a day.  Recommended antibiotic course for possible underlying infection and once wound heals can consider revision at that time.  Completed course of antibiotics with Keflex. Continue wound care ?

## 2021-04-21 NOTE — Assessment & Plan Note (Addendum)
Macrocytic,. Hemoglobin stable around 10. Continue vitamin b12 supplementation.  ?

## 2021-04-21 NOTE — Assessment & Plan Note (Signed)
Patient presenting to ED with confusion, following fall and was found to have acute and chronic subdural hematomas likely complicated by alcohol intoxication.  Was evaluated by neurosurgery with repeat CT head and no further intervention recommended at this time; with outpatient follow-up with neurosurgery, Dr. Reatha Armour in 2 weeks. ?? ?

## 2021-04-21 NOTE — Assessment & Plan Note (Addendum)
Magnesium oxide 800 mg p.o. twice daily. ?

## 2021-04-21 NOTE — Progress Notes (Signed)
Patient IV removed on previous shift. [place] MD notified. MD states patient does not  need new IV in. ? ?Gwendolyn Grant, RN  ?

## 2021-04-21 NOTE — Progress Notes (Signed)
Patient ID: Mindy Young, female   DOB: 19-Dec-1945, 76 y.o.   MRN: [id number] ? ? ? ?Progress Note from the Palliative Medicine Team at [facility] ? ? ?Patient Name: Mindy Young        ?Date: 04/21/2021 ?DOB: Jun 30, 1945  Age: 76 y.o. MRN#: [id number] ?Attending Physician: [place] Eric J, DO ?Primary Care Physician: Loura Pardon, MD ?Admit Date: 04/15/2021 ? ? ?Medical records reviewed  ? ?76 y.o. female  admitted on 04/15/2021 with medical history significant for alcohol abuse, anxiety, depression, GERD, seizure disorder and previous SDH, hypertension, tardive dyskinesia and breast cancer status post lumpectomy, who presented to the emergency room with acute onset of recurrent falls.   ?  ?She had a fall 3 days ago with subsequent right facial wound    ?  ?When she came to the ER, vital signs were within normal.  Labs revealed significant hyponatremia 120 and hypochloremia of 80 compared to 128 and 90 on 2/15, with a calcium of 8 and albumin of 3, total protein of 5.1 and magnesium level of 1 with a potassium of 3.6.  CBC showed anemia with hemoglobin of 10.1 and hematocrit 29.7 close to previous levels with macrocytosis.  Her blood glucose was 60 and alcohol abuse 62. ?  ?Imaging: Head CT scan revealed acute subdural hemorrhage layering along the left tentorial leaflet that is 3 mm thick and along the right cerebral convexity that is 2 mm thick with no significant mass effect.  It showed right posterior scalp contusion without acute fracture and chronic microvascular disease as well as cerebral atrophy. ? ?Maxillofacial CT which was motion limited without evidence of fracture. ? ?C-spine CT showed no evidence for acute fracture or traumatic malalignment.  It showed severe multilevel degenerative change and emphysema. ?  ?Admitted for treatment stabilization. ? ?Unfortunately patient remains confused and intermittently agitated.  Sitter remains at bedside ? ? ?This NP visited patient at the bedside as a follow up  for palliative medicine needs and emotional support.  Patient is confused and without medical decision-making capacity at the current time. ? ?I was able to speak to her daughter Kaileia Flow, and her cousin Erskine Squibb by telephone again today   ? ?Patient has had a long history with alcohol use/abuse disorder.  Unfortunately she has had many many attempts at rehabilitation without success.  Family expressed concern and frustration with their inability "to help". ? ?Patient's daughter tells me that she is in contact with her mother's lawyer and has been told that indeed she is documented healthcare power of attorney.  She hopes to get copy and have it faxed, so we can get a copy in  patient's medical records. ? ?Education offered on capacity versus competency as it relates to guardianship.   Education offered on guardianship. ? ?Daughter is willing to speak on her mother's behalf as long as she does not have capacity, and understandably worries that once patient is cognitively stable she will again make poor choices. ? ?Education offered regarding Al-Anon as they support families of people with alcohol use/abuse disorders. ? ?PMT will continue to support holistically ? ?Total time spent on the unit was 50 minutes  ? ?Greater than 50% of the time was spent in counseling and coordination of care ? ?Wadie Lessen NP  ?Palliative Medicine Team Team Phone # [phone] ?Pager [phone] ?  ?

## 2021-04-22 DIAGNOSIS — R4182 Altered mental status, unspecified: Secondary | ICD-10-CM | POA: Diagnosis present

## 2021-04-22 DIAGNOSIS — S065XAA Traumatic subdural hemorrhage with loss of consciousness status unknown, initial encounter: Secondary | ICD-10-CM | POA: Diagnosis not present

## 2021-04-22 LAB — BASIC METABOLIC PANEL
Anion gap: 10 (ref 5–15)
BUN: 5 mg/dL — ABNORMAL LOW (ref 8–23)
CO2: 24 mmol/L (ref 22–32)
Calcium: 8.8 mg/dL — ABNORMAL LOW (ref 8.9–10.3)
Chloride: 94 mmol/L — ABNORMAL LOW (ref 98–111)
Creatinine, Ser: 0.55 mg/dL (ref 0.44–1.00)
GFR, Estimated: >60 mL/min (ref >60)
Glucose, Bld: 101 mg/dL — ABNORMAL HIGH (ref 70–99)
Potassium: 4.4 mmol/L (ref 3.5–5.1)
Sodium: 128 mmol/L — ABNORMAL LOW (ref 135–145)

## 2021-04-22 LAB — MAGNESIUM: Magnesium: 1.3 mg/dL — ABNORMAL LOW (ref 1.7–2.4)

## 2021-04-22 MED ORDER — SODIUM CHLORIDE 0.9 % IV SOLN: INTRAVENOUS | Status: AC

## 2021-04-22 MED ORDER — MAGNESIUM SULFATE 4 GM/100ML IV SOLN
4.0000 g | Freq: Once | INTRAVENOUS | Status: AC
  Administered 2021-04-22: 4 g via INTRAVENOUS
  Filled 2021-04-22: qty 100

## 2021-04-22 NOTE — Progress Notes (Signed)
?  X-cover Note: ?RN reports pt has fallen twice during this admission. No safety sitter tonight available. RN requesting restraints for patient safety. ? ? ?Kristopher Oppenheim, DO ?[facility] ? ?

## 2021-04-22 NOTE — Evaluation (Signed)
Speech Language Pathology Evaluation Patient Details Name: Mindy Young MRN: [id number] DOB: 28-Jan-1946 Today's Date: 04/22/2021 Time: 7564-3329 SLP Time Calculation (min) (ACUTE ONLY): 21 min  Problem List:  Patient Active Problem List   Diagnosis Date Noted   Encounter for assessment of decision-making capacity    Face lacerations 04/17/2021   Subdural hematoma 04/15/2021   Hypomagnesemia 02/20/2021   Elevated troponin 02/20/2021   COPD (chronic obstructive pulmonary disease) ([place]) 02/20/2021   Diarrhea 02/20/2021   Prolonged QT interval 02/20/2021   Lung neoplasm 02/20/2021   ETOH abuse 12/29/2020   Alcohol use disorder, severe, dependence ([place]) 12/29/2020   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 ([place]) 12/29/2020   Alcohol dependence with uncomplicated intoxication ([employer])    Clavicle fracture 10/29/2020   Rib fracture 10/29/2020   Hypokalemia 06/14/2020   Seizure ([place]) 06/13/2020   Impingement syndrome of left shoulder 03/27/2019   Pain in left shoulder 02/21/2019   Rib pain on right side 02/21/2019   Low back pain 02/07/2019   Anxiety 12/09/2018   Difficulty speaking 12/09/2018   Leukocytosis 12/09/2018   Seizure-like activity ([place]) 12/09/2018   Pelvic fracture ([place]) 11/25/2018   Pubic bone fracture ([place]) 11/24/2018   Left knee pain 03/29/2018   Unilateral primary osteoarthritis, left knee 03/29/2018   Genetic testing 01/24/2018   Family history of breast cancer    Family history of prostate cancer    Malignant neoplasm of lower-inner quadrant of right breast of female, estrogen receptor positive ([place]) 10/24/2017   Anemia 06/29/2011   Hypertension    Hyponatremia 06/25/2011   Transaminitis 06/25/2011   Alcohol abuse 06/25/2011   Cough 06/25/2011   Cigarette smoker 06/25/2011   Past Medical History:  Past Medical History:  Diagnosis Date   Alcohol abuse    Allergy    Anemia 06/29/2011   Anxiety    Arthritis    Breast cancer ([place])     right breast   C1 cervical fracture ([place]) 02/1997   Cancer ([place])    melanoma   Depression    Family history of breast cancer    Family history of prostate cancer    GERD (gastroesophageal reflux disease)    Hypertension    Tardive dyskinesia    Past Surgical History:  Past Surgical History:  Procedure Laterality Date   ABDOMINAL HYSTERECTOMY     ASPIRATION OF ABSCESS Right 11/20/2017   Procedure: ASPIRATION OF RIGHT AXILLARY SEROMA;  Surgeon: Rolm Bookbinder, MD;  Location: [place];  Service: General;  Laterality: Right;   AUGMENTATION MAMMAPLASTY Bilateral    biateral implants , approx 2015   BREAST LUMPECTOMY Right 11/02/2017   re-ex 11-20-17   BREAST LUMPECTOMY WITH RADIOACTIVE SEED AND SENTINEL LYMPH NODE BIOPSY Right 11/02/2017   Procedure: BREAST LUMPECTOMY WITH RADIOACTIVE SEED AND SENTINEL LYMPH NODE BIOPSY;  Surgeon: Rolm Bookbinder, MD;  Location: [place];  Service: General;  Laterality: Right;   BRONCHIAL WASHINGS  02/21/2021   Procedure: BRONCHIAL WASHINGS;  Surgeon: Candee Furbish, MD;  Location: [facility] ENDOSCOPY;  Service: Pulmonary;;   CHOLECYSTECTOMY     collarbone     INCONTINENCE SURGERY     KNEE SURGERY     removal of cyst, repair of cartiledge   LAPAROSCOPY     for endometriosis   RE-EXCISION OF BREAST LUMPECTOMY Right 11/20/2017   Procedure: RE-EXCISION OF RIGHT BREAST MARGINS;  Surgeon: Rolm Bookbinder, MD;  Location: [place];  Service: General;  Laterality: Right;  ROTATOR CUFF REPAIR     left   VIDEO BRONCHOSCOPY Right 02/21/2021   Procedure: VIDEO BRONCHOSCOPY WITHOUT FLUORO;  Surgeon: Candee Furbish, MD;  Location: [facility] ENDOSCOPY;  Service: Pulmonary;  Laterality: Right;   HPI:  Pt is a 76 y.o. female who presented to the ED with acute onset of recurrent falls. CT head 2/23: Acute subdural hemorrhage layering along the left  tentorium, measuring up to 3 mm in thickness. PMH: alcohol abuse,  anxiety, depression, GERD, seizure disorder and previous SDH, hypertension, tardive dyskinesia and breast cancer status post lumpectomy. Speech-language evaluation 12/12/18: short term recall, thought organization, mental calculations, and higher levels of cognition including reasoning, problem solving and executive functions.   Assessment / Plan / Recommendation Clinical Impression  Pt participated in speech-language-cognition evaluation. 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 Pt denied any baseline or acute deficits in speech, language, or cognition. However, pt's reliability as a historian is questioned and, with the pt's permission, her caregiver was contacted. Pt's caregiver, [name], reported that the pt has been demonstrating a cognitive decline since last year. She stated that the pt has had outpatient cognitive testing and that she would have rated her as 4/10 in October, and 2/10 two weeks prior to admission. Per the caregiver, the pt is currently at her cognitive worst since this fall. The [facility] Mental Status Examination was completed to evaluate the pt's cognitive-linguistic skills. She achieved a score of 12/30 which is below the normal limits of 27 or more out of 30. She exhibited deficits in the areas of awareness, attention, memory, problem solving, and executive function. Skilled SLP services are clinically indicated at this time to improve cognitive-linguistic function.    SLP Assessment  SLP Recommendation/Assessment: Patient needs continued Speech [facility] Services SLP Visit Diagnosis: Cognitive communication deficit (R41.841)    Recommendations for follow up therapy are one component of a multi-disciplinary discharge planning process, led by the attending physician.  Recommendations may be updated based on patient status, additional functional criteria and insurance authorization.     Follow Up Recommendations  Skilled nursing-short term rehab (<3 hours/day)    Assistance Recommended at Discharge  Frequent or constant Supervision/Assistance  Functional Status Assessment Patient has had a recent decline in their functional status and demonstrates the ability to make significant improvements in function in a reasonable and predictable amount of time.  Frequency and Duration min 2x/week  2 weeks      SLP Evaluation Cognition  Overall Cognitive Status: Impaired/Different from baseline Arousal/Alertness: Awake/alert Orientation Level: Oriented to person;Oriented to place;Disoriented to time;Disoriented to situation Year: 2023 Month: March Day of Week: Incorrect Attention: Focused;Sustained Focused Attention: Impaired Focused Attention Impairment: Verbal complex Sustained Attention: Impaired Sustained Attention Impairment: Verbal complex Memory: Impaired Memory Impairment:  (Immediate: 5/5 with repetition; delayed: 2/5; with cues: 1/3) Awareness: Impaired Awareness Impairment: Emergent impairment Problem Solving: Impaired Problem Solving Impairment: Verbal basic Executive Function: Sequencing;Organizing Sequencing:  (clock drawing:) Organizing: Impaired Organizing Impairment: Verbal complex (backward digit span: 0/2)       Comprehension  Auditory Comprehension Yes/No Questions: Within Functional Limits Conversation: Simple Interfering Components: Attention;Processing speed;Working Camera operator Expression Overall Verbal Expression: Appears within functional limits for tasks assessed Initiation: No impairment Level of Generative/Spontaneous Verbalization: Conversation Repetition: No impairment Naming: No impairment   Oral / Motor  Oral Motor/Sensory Function Overall Oral Motor/Sensory Function: Within functional limits Motor Speech Overall Motor Speech:  Appears within functional limits for tasks assessed Respiration: Within functional  limits Phonation: Normal Resonance: Within functional limits Articulation: Within functional limitis Intelligibility: Intelligible Motor Planning: Witnin functional limits Motor Speech Errors: Not applicable           Toddrick Sanna I. Hardin Negus, [place], [place] Office number [phone] Pager [place] 04/22/2021, 1:09 PM

## 2021-04-22 NOTE — Progress Notes (Signed)
Physical Therapy Treatment ?Patient Details ?Name: Mindy Young ?MRN: [id number] ?DOB: Oct 05, 1945 ?Today's Date: 04/22/2021 ? ? ?History of Present Illness 76 yo female presented to ED with head trauma resulting from a fall while intoxicated. Pt was found to have Hyponatremia.  Pt has history of falls, at least 3 in the past 2 weeks where she presented to the ED according to he chart. PMHx:  alcohol abuse, anxiety, depression, GERD, seizure disorder and previous SDH, hypertension, tardive dyskinesia, and breast cancer status post lumpectomy, UTI, ? ?  ?PT Comments  ? ? Pt improving slowly toward goals.  Limited by fatigue within session.  Emphasis on safe transitions to EOB, sit to stand safety and progression of gait stability.    ?Recommendations for follow up therapy are one component of a multi-disciplinary discharge planning process, led by the attending physician.  Recommendations may be updated based on patient status, additional functional criteria and insurance authorization. ? ?Follow Up Recommendations ? Skilled nursing-short term rehab (<3 hours/day) ?  ?  ?Assistance Recommended at Discharge Frequent or constant Supervision/Assistance  ?Patient can return home with the following A little help with walking and/or transfers;A little help with bathing/dressing/bathroom;Assistance with cooking/housework;Direct supervision/assist for medications management;Direct supervision/assist for financial management;Assist for transportation;Help with stairs or ramp for entrance ?  ?Equipment Recommendations ? Rolling walker (2 wheels)  ?  ?Recommendations for Other Services   ? ? ?  ?Precautions / Restrictions Precautions ?Precautions: Fall ?Precaution Comments: sitter  ?  ? ?Mobility ? Bed Mobility ?Overal bed mobility: Needs Assistance ?Bed Mobility: Supine to Sit, Sit to Supine ?  ?Sidelying to sit: Supervision ?Supine to sit: Supervision ?  ?  ?General bed mobility comments: no assist given, pt also able to readjust  in the bed with cues. ?  ? ?Transfers ?Overall transfer level: Needs assistance ?Equipment used: None ?Transfers: Sit to/from Stand ?Sit to Stand: Min guard ?  ?  ?  ?  ?  ?General transfer comment: cues for safer hand placement and other safety cues for mild impulsiveness ?  ? ?Ambulation/Gait ?Ambulation/Gait assistance: Min assist ?Gait Distance (Feet): 65 Feet ?Assistive device: Rolling walker (2 wheels) ?Gait Pattern/deviations: Step-through pattern, Decreased stride length, Decreased step length - right, Decreased step length - left ?Gait velocity: slow ?Gait velocity interpretation: <1.8 ft/sec, indicate of risk for recurrent falls ?  ?General Gait Details: pt with slow mildly unsteady steps with drift to the right, needing assist with maneuvering/correcting RW direction. ? ? ?Stairs ?  ?  ?  ?  ?  ? ? ?Wheelchair Mobility ?  ? ?Modified Rankin (Stroke Patients Only) ?  ? ? ?  ?Balance Overall balance assessment: Needs assistance ?Sitting-balance support: Feet supported ?Sitting balance-Leahy Scale: Fair ?  ?  ?Standing balance support: Single extremity supported, During functional activity, Bilateral upper extremity supported ?Standing balance-Leahy Scale: Poor ?Standing balance comment: reliance on RW ?  ?  ?  ?  ?  ?  ?  ?  ?  ?  ?  ?  ? ?  ?Cognition Arousal/Alertness: Awake/alert ?Behavior During Therapy: Flat affect ?Overall Cognitive Status: Impaired/Different from baseline (NT formally, followed simple commands) ?  ?  ?  ?  ?  ?  ?  ?  ?  ?  ?  ?  ?  ?  ?  ?  ?  ?  ?  ? ?  ?Exercises   ? ?  ?General Comments   ?  ?  ? ?Pertinent Vitals/Pain Pain  Assessment ?Pain Assessment: No/denies pain  ? ? ?Home Living   ?  ?  ?  ?  ?  ?  ?  ?  ?  ?   ?  ?Prior Function    ?  ?  ?   ? ?PT Goals (current goals can now be found in the care plan section) Acute Rehab PT Goals ?PT Goal Formulation: With patient ?Time For Goal Achievement: 04/30/21 ?Potential to Achieve Goals: Fair ?Progress towards PT goals:  Progressing toward goals ? ?  ?Frequency ? ? ? Min 3X/week ? ? ? ?  ?PT Plan Current plan remains appropriate  ? ? ?Co-evaluation   ?  ?  ?  ?  ? ?  ?AM-PAC PT "6 Clicks" Mobility   ?Outcome Measure ? Help needed turning from your back to your side while in a flat bed without using bedrails?: A Little ?Help needed moving from lying on your back to sitting on the side of a flat bed without using bedrails?: A Little ?Help needed moving to and from a bed to a chair (including a wheelchair)?: A Little ?Help needed standing up from a chair using your arms (e.g., wheelchair or bedside chair)?: A Little ?Help needed to walk in hospital room?: A Little ?Help needed climbing 3-5 steps with a railing? : A Lot ?6 Click Score: 17 ? ?  ?End of Session   ?Activity Tolerance: Patient limited by fatigue ?Patient left: in bed;with call bell/phone within reach;with nursing/sitter in room;with bed alarm set ?Nurse Communication: Mobility status ?PT Visit Diagnosis: Unsteadiness on feet (R26.81);Other symptoms and signs involving the nervous system (R29.898);Other abnormalities of gait and mobility (R26.89) ?  ? ? ?Time: 9166-0600 ?PT Time Calculation (min) (ACUTE ONLY): 13 min ? ?Charges:  $Gait Training: 8-22 mins          ?          ? ?04/22/2021 ? ?Ginger Carne., PT ?Acute Rehabilitation Services ?[phone]  (pager) ?[phone]  (office) ? ? ?Tessie Fass Avaneesh Pepitone ?04/22/2021, 3:28 PM ? ?

## 2021-04-22 NOTE — Progress Notes (Signed)
PROGRESS NOTE    Mindy Young  VZC:[id number] DOB: Mar 09, 1945 DOA: 04/15/2021 PCP: Loura Pardon, MD    Brief Narrative:  Mindy Young is a 76 y.o. Caucasian female with medical history significant for alcohol abuse, anxiety, depression, GERD, seizure disorder and previous SDH, hypertension, tardive dyskinesia and breast cancer status post lumpectomy, who presented to the emergency room with acute onset of recurrent falls.   Head CT scan revealed acute subdural hemorrhage layering along the left tentorial leaflet that is 3 mm thick and along the right cerebral convexity that is 2 mm thick with no significant mass effect.  Maxillofacial CT which was motion limited without evidence of fracture. C-spine CT showed no evidence for acute fracture or traumatic malalignment.  It showed severe multilevel degenerative change and emphysema.  Repeat CT head showed nnlarging low-attenuation subdural hematoma along the left cerebral convexity resulting in increasing mass effect upon the left cerebral hemisphere and developing 4 mm left-to-right midline shift, stable 3 mm subdural hematoma along the left tentorium. On call physician Dr Cyd Silence discussed with neurosurgery and suggested that these findings are chronic. Recommended outpatient follow up with Dr Reatha Armour in 2 weeks.  Hospital course also complicated by confusion and alcohol withdrawals.  Seen by PT and OT recommended SNF placement.  Patient currently not able to make her own medical decisions per psychiatry and given previous PCP conversations that her next fall could be fatal she is not safe for discharge home.      Assessment & Plan:   Assessment and Plan: * Subdural hematoma- (present on admission) Patient presenting to ED with confusion, following fall and was found to have acute and chronic subdural hematomas likely complicated by alcohol intoxication.  Was evaluated by neurosurgery with repeat CT head and no further intervention recommended  at this time; with outpatient follow-up with neurosurgery, Dr. Reatha Armour in 2 weeks.    Face lacerations- (present on admission) Patient with facial laceration to right side.  Seen by ENT, Dr. Janace Hoard; and given that this is likely an old facial laceration with eschar and exudate already formed in the base, recommend wound care, cleaning with hydroperoxide once a day and saline gauze to the wound twice a day.  Recommended antibiotic course for possible underlying infection and once wound heals can consider revision at that time. --Continue wound care --Keflex 500 mg p.o. twice daily  Hyponatremia- (present on admission) Etiology likely secondary to EtOH use disorder.  Not on a thiazide diuretic outpatient sodium 120 on admission. --Na 120>>127>130>128 --Continue to encourage EtOH cessation --Continue intermittent monitoring of sodium level   Seizure (HCC) No seizure episodes since admission, per pharmacy patient is not taking phenobarbital.  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 PCP called and said he is worried of her well being and currently is not safe for discharge home.  Was evaluated by psychiatry on 04/20/2021, currently does not have capacity for medical decision making. --Continue to encourage alcohol cessation --OT/SLP for cognitive evaluation       Alcohol abuse- (present on admission) EtOH level elevated 62 on admission. --Multivitamin, thiamine, folic acid --Continue encourage alcohol cessation  Hypertension- (present on admission) --Losartan 50 mg p.o. daily --Metoprolol succinate 50 mg p.o. daily  Anemia- (present on admission) Macrocytic,. Hemoglobin stable around 10.  --Continue vitamin b12 supplementation.   COPD (chronic obstructive pulmonary disease) ([place])- (present on admission)  Stable, no acute  exacerbation. --Albuterol neb as needed  Hypomagnesemia- (present on admission) Magnesium level 1.3 this am.  Will replete today with 4g IV mag --continue magnesium oxide 400mg  PO BID. --Repeat electrolytes including magnesium in a.m.     DVT prophylaxis: SCDs Start: 04/15/21 1935    Code Status: Full Code Family Communication: No family present at bedside this morning; updated patients daughter, [name] via telephone this morning  Disposition Plan:  Level of care: Progressive Status is: Inpatient Remains inpatient appropriate because: Pending SNF placement     Consultants:  Neurosurgery, Dr. Reatha Armour Psychiatry ENT, Dr. Janace Hoard  Procedures:  None  Antimicrobials:  Keflex 2/28>> Ancef 2/24 - 2/28    Subjective: Patient seen examined bedside, resting comfortably.  Sitter present.  No specific complaints this morning.  Asked when she can go home.  Updated patient's daughter via telephone this morning.  No other questions or concerns at this time. Denies headache, no vision changes, no chest pain, no palpitations, no shortness of breath, no abdominal pain, no weakness, no fatigue, no paresthesias.  No acute events overnight per nursing staff.  Continues to lack capacity for medical decision-making per psychiatry 04/20/2021.  Objective: Vitals:   04/21/21 2102 04/22/21 0001 04/22/21 0416 04/22/21 0730  BP: (!) 154/92 (!) 146/83 (!) 156/90 (!) 152/98  Pulse: 82 81 78 88  Resp: 18 16 18 18   Temp: 97.8 F (36.6 C) 98.1 F (36.7 C) 97.7 F (36.5 C) 98 F (36.7 C)  TempSrc: Oral Oral Oral Oral  SpO2: 99% 100% 99% 98%  Weight:      Height:        Intake/Output Summary (Last 24 hours) at 04/22/2021 1021 Last data filed at 04/22/2021 0240 Gross per 24 hour  Intake 177 ml  Output 650 ml  Net -473 ml   Filed Weights   04/15/21 1451  Weight: 61.2 kg    Examination:  Physical Exam: GEN: NAD, alert and oriented x 3, chronically ill in appearance, appears older than stated  age with multiple areas of ecchymosis, skin tears in various stages of healing HEENT:  PERRL, EOMI, sclera clear, MMM, noted right facial wound without erythema/fluctuance PULM: CTAB w/o wheezes/crackles, normal respiratory effort, on room air CV: RRR w/o M/G/R GI: abd soft, NTND, NABS, no R/G/M MSK: no peripheral edema, muscle strength globally intact 5/5 bilateral upper/lower extremities NEURO: CN II-XII intact, no focal deficits, sensation to light touch intact PSYCH: Depressed mood, flat affect Integumentary: Multiple skin tears to extremities in various stages of healing, multiple areas of ecchymosis to extremities, noted right facial wound without erythema/fluctuance         Data Reviewed: I have personally reviewed following labs and imaging studies  CBC: Recent Labs  Lab 04/15/21 1458 04/16/21 0344 04/17/21 0119 04/18/21 0214 04/19/21 0308  WBC 7.0 5.7 4.5 5.3 4.5  NEUTROABS 5.1  --   --   --   --   HGB 10.1* 10.7* 10.5* 10.0* 10.0*  HCT 29.7* 29.4* 30.2* 28.7* 28.6*  MCV 103.5* 100.7* 102.7* 103.6* 101.1*  PLT 291 263 277 271 973   Basic Metabolic Panel: Recent Labs  Lab 04/16/21 0344 04/17/21 0119 04/18/21 0214 04/19/21 0308 04/20/21 0917 04/21/21 0123 04/22/21 0125  NA 125*   < > 127* 130* 127* 130* 128*  K 3.6   < > 3.5 4.4 3.7 3.6 4.4  CL 86*   < > 92* 92* 91* 95* 94*  CO2 27   < > 23 26 26  26 24  GLUCOSE 86   < > 130* 100* 131* 106* 101*  BUN 10   < > 5* 6* 6* 7* 5*  CREATININE 0.71   < > 0.64 0.80 0.63 0.66 0.55  CALCIUM 8.1*   < > 8.2* 9.0 8.6* 8.8* 8.8*  MG 1.9   < > 1.7 1.5* 1.4* 1.8 1.3*  PHOS 3.6  --   --   --  4.3  --   --    < > = values in this interval not displayed.   GFR: Estimated Creatinine Clearance: 52.3 mL/min (by C-G formula based on SCr of 0.55 mg/dL). Liver Function Tests: Recent Labs  Lab 04/15/21 1458 04/21/21 0123  AST 48* 25  ALT 40 15  ALKPHOS 75 78  BILITOT 0.6 0.2*  PROT 5.1* 5.0*  ALBUMIN 3.0* 2.6*   No  results for input(s): LIPASE, AMYLASE in the last 168 hours. No results for input(s): AMMONIA in the last 168 hours. Coagulation Profile: No results for input(s): INR, PROTIME in the last 168 hours. Cardiac Enzymes: No results for input(s): CKTOTAL, CKMB, CKMBINDEX, TROPONINI in the last 168 hours. BNP (last 3 results) No results for input(s): PROBNP in the last 8760 hours. HbA1C: No results for input(s): HGBA1C in the last 72 hours. CBG: Recent Labs  Lab 04/15/21 2014  GLUCAP 80   Lipid Profile: No results for input(s): CHOL, HDL, LDLCALC, TRIG, CHOLHDL, LDLDIRECT in the last 72 hours. Thyroid Function Tests: No results for input(s): TSH, T4TOTAL, FREET4, T3FREE, THYROIDAB in the last 72 hours. Anemia Panel: No results for input(s): VITAMINB12, FOLATE, FERRITIN, TIBC, IRON, RETICCTPCT in the last 72 hours. Sepsis Labs: No results for input(s): PROCALCITON, LATICACIDVEN in the last 168 hours.  Recent Results (from the past 240 hour(s))  Resp Panel by RT-PCR (Flu A&B, Covid) Nasopharyngeal Swab     Status: None   Collection Time: 04/15/21  7:35 PM   Specimen: Nasopharyngeal Swab; Nasopharyngeal(NP) swabs in vial transport medium  Result Value Ref Range Status   SARS Coronavirus 2 by RT PCR NEGATIVE NEGATIVE Final    Comment: (NOTE) SARS-CoV-2 target nucleic acids are NOT DETECTED.  The SARS-CoV-2 RNA is generally detectable in upper respiratory specimens during the acute phase of infection. The lowest concentration of SARS-CoV-2 viral copies this assay can detect is 138 copies/mL. A negative result does not preclude SARS-Cov-2 infection and should not be used as the sole basis for treatment or other patient management decisions. A negative result may occur with  improper specimen collection/handling, submission of specimen other than nasopharyngeal swab, presence of viral mutation(s) within the areas targeted by this assay, and inadequate number of viral copies(<138  copies/mL). A negative result must be combined with clinical observations, patient history, and epidemiological information. The expected result is Negative.  Fact Sheet for Patients:  EntrepreneurPulse.com.au  Fact Sheet for Healthcare Providers:  IncredibleEmployment.be  This test is no t yet approved or cleared by the [place] FDA and  has been authorized for detection and/or diagnosis of SARS-CoV-2 by FDA under an Emergency Use Authorization (EUA). This EUA will remain  in effect (meaning this test can be used) for the duration of the COVID-19 declaration under Section 564(b)(1) of the Act, 21 U.S.C.section 360bbb-3(b)(1), unless the authorization is terminated  or revoked sooner.       Influenza A by PCR NEGATIVE NEGATIVE Final   Influenza B by PCR NEGATIVE NEGATIVE Final    Comment: (NOTE) The Xpert Xpress SARS-CoV-2/FLU/RSV plus assay is  intended as an aid in the diagnosis of influenza from Nasopharyngeal swab specimens and should not be used as a sole basis for treatment. Nasal washings and aspirates are unacceptable for Xpert Xpress SARS-CoV-2/FLU/RSV testing.  Fact Sheet for Patients: EntrepreneurPulse.com.au  Fact Sheet for Healthcare Providers: IncredibleEmployment.be  This test is not yet approved or cleared by the [place] FDA and has been authorized for detection and/or diagnosis of SARS-CoV-2 by FDA under an Emergency Use Authorization (EUA). This EUA will remain in effect (meaning this test can be used) for the duration of the COVID-19 declaration under Section 564(b)(1) of the Act, 21 U.S.C. section 360bbb-3(b)(1), unless the authorization is terminated or revoked.  Performed at [facility], [place] [address], [place],  [address]          Radiology Studies: No results found.      Scheduled Meds:  cephALEXin  500 mg Oral L57W   folic acid  1 mg Oral  Daily   levETIRAcetam  1,000 mg Oral BID   losartan  50 mg Oral Daily   magnesium oxide  400 mg Oral BID   melatonin  10 mg Oral QHS   metoprolol succinate  50 mg Oral Daily   multivitamin with minerals  1 tablet Oral Daily   neomycin-bacitracin-polymyxin   Topical BID   oxybutynin  10 mg Oral q morning   sodium chloride flush  10-40 mL Intracatheter Q12H   thiamine  100 mg Oral Daily   vitamin B-12  3,000 mcg Oral q morning   Continuous Infusions:  sodium chloride 75 mL/hr at 04/22/21 0913   magnesium sulfate bolus IVPB 4 g (04/22/21 0918)     LOS: 7 days    Time spent: 44 minutes spent on chart review, discussion with nursing staff, consultants, updating family and interview/physical exam; more than 50% of that time was spent in counseling and/or coordination of care.    Kalyn Dimattia J [place] DO [facility] Available via Epic secure chat 7am-7pm After these hours, please refer to coverage provider listed on [email or web address] 04/22/2021, 10:21 AM

## 2021-04-22 NOTE — TOC Progression Note (Signed)
Transition of Care (TOC) - Progression Note  ? ? ?Patient Details  ?Name: Mindy Young ?MRN: [id number] ?Date of Birth: 01/15/1946 ? ?Transition of Care (TOC) CM/SW Contact  ?Coralee Pesa, LCSWA ?Phone Number: ?04/22/2021, 1:17 PM ? ?Clinical Narrative:    ?CSW spoke with dtr, [name], and discussed SNF options. At this time pt has been deemed to not have capacity and dtr would like to proceed with SNF. She notes no preference at this time and was given bed offers and advised of [email or web address] [name] states concerns that her mother will regain capacity and not want services. CSW advised that while she does not have capacity, the choices should be made in her best interest, however, with capacity, it is ultimately the pt's choice. Pt is not medically stable at this time. TOC will continue to follow for DC needs. ? ? ?Expected Discharge Plan: [place] ?Barriers to Discharge: Continued Medical Work up ? ?Expected Discharge Plan and Services ?Expected Discharge Plan: [place] ?In-house Referral: Clinical Social Work ?Discharge Planning Services: CM Consult ?  ?Living arrangements for the past 2 months: [place] ?                ?  ?  ?  ?  ?  ?  ?  ?  ?  ?  ? ? ?Social Determinants of Health (SDOH) Interventions ?  ? ?Readmission Risk Interventions ?Readmission Risk Prevention Plan 02/23/2021  ?Transportation Screening Complete  ?Medication Review Press photographer) Complete  ?PCP or Specialist appointment within 3-5 days of discharge Complete  ?[place] or Home Care Consult Complete  ?SW Recovery Care/Counseling Consult Complete  ?Palliative Care Screening Not Applicable  ?[place] Patient Refused  ?Some recent data might be hidden  ? ? ?

## 2021-04-23 LAB — BASIC METABOLIC PANEL
Anion gap: 9 (ref 5–15)
BUN: <5 mg/dL — ABNORMAL LOW (ref 8–23)
CO2: 24 mmol/L (ref 22–32)
Calcium: 8.7 mg/dL — ABNORMAL LOW (ref 8.9–10.3)
Chloride: 93 mmol/L — ABNORMAL LOW (ref 98–111)
Creatinine, Ser: 0.47 mg/dL (ref 0.44–1.00)
GFR, Estimated: >60 mL/min (ref >60)
Glucose, Bld: 96 mg/dL (ref 70–99)
Potassium: 4.2 mmol/L (ref 3.5–5.1)
Sodium: 126 mmol/L — ABNORMAL LOW (ref 135–145)

## 2021-04-23 LAB — OSMOLALITY: Osmolality: 264 mOsm/kg — ABNORMAL LOW (ref 275–295)

## 2021-04-23 LAB — MAGNESIUM: Magnesium: 1.6 mg/dL — ABNORMAL LOW (ref 1.7–2.4)

## 2021-04-23 MED ORDER — MAGNESIUM SULFATE 4 GM/100ML IV SOLN
4.0000 g | Freq: Once | INTRAVENOUS | Status: AC
  Administered 2021-04-23: 4 g via INTRAVENOUS
  Filled 2021-04-23: qty 100

## 2021-04-23 MED ORDER — GERHARDT'S BUTT CREAM
TOPICAL_CREAM | Freq: Two times a day (BID) | CUTANEOUS | Status: DC
  Administered 2021-04-23: 1 via TOPICAL
  Filled 2021-04-23: qty 1

## 2021-04-23 MED ORDER — SODIUM CHLORIDE 1 G PO TABS
1.0000 g | ORAL_TABLET | Freq: Two times a day (BID) | ORAL | Status: DC
Start: 2021-04-23 — End: 2021-04-28
  Administered 2021-04-23 – 2021-04-27 (×10): 1 g via ORAL
  Filled 2021-04-23 (×11): qty 1

## 2021-04-23 MED ORDER — QUETIAPINE FUMARATE 25 MG PO TABS
12.5000 mg | ORAL_TABLET | Freq: Every day | ORAL | Status: DC
Start: 2021-04-23 — End: 2021-04-28
  Administered 2021-04-23 – 2021-04-26 (×4): 12.5 mg via ORAL
  Filled 2021-04-23 (×4): qty 1

## 2021-04-23 NOTE — Assessment & Plan Note (Addendum)
Patient continues excessive use of hard liquor outpatient with Mindy Young.  Counseled on several occasions for need for complete cessation. ?

## 2021-04-23 NOTE — TOC Progression Note (Addendum)
Transition of Care (TOC) - Progression Note  ? ? ?Patient Details  ?Name: Mindy Young ?MRN: [id number] ?Date of Birth: 1945-04-26 ? ?Transition of Care (TOC) CM/SW Contact  ?[place], LCSW ?Phone Number: ?04/23/2021, 10:26 AM ? ?Clinical Narrative:    ? ?CSW called daughter Gorden Harms regarding SNF choice. She requests a little more time to call and review SNF offers. CSW will call back later in afternoon.  ? ?[id number]: CSW called pt daughter; no answer and mailbox is full.  ? ? ?Expected Discharge Plan: [place] ?Barriers to Discharge: Continued Medical Work up ? ?Expected Discharge Plan and Services ?Expected Discharge Plan: [place] ?In-house Referral: Clinical Social Work ?Discharge Planning Services: CM Consult ?  ?Living arrangements for the past 2 months: [place] ?                ?  ?  ?  ?  ?  ?  ?  ?  ?  ?  ? ? ?Social Determinants of Health (SDOH) Interventions ?  ? ?Readmission Risk Interventions ?Readmission Risk Prevention Plan 02/23/2021  ?Transportation Screening Complete  ?Medication Review Press photographer) Complete  ?PCP or Specialist appointment within 3-5 days of discharge Complete  ?[place] or Home Care Consult Complete  ?SW Recovery Care/Counseling Consult Complete  ?Palliative Care Screening Not Applicable  ?[place] Patient Refused  ?Some recent data might be hidden  ? ? ?

## 2021-04-23 NOTE — Progress Notes (Signed)
Occupational Therapy Treatment ?Patient Details ?Name: Mindy Young ?MRN: [id number] ?DOB: 12-02-1945 ?Today's Date: 04/23/2021 ? ? ?History of present illness 76 yo female presented to ED with head trauma resulting from a fall while intoxicated. Pt was found to have Hyponatremia.  Pt has history of falls, at least 3 in the past 2 weeks where she presented to the ED according to he chart. PMHx:  alcohol abuse, anxiety, depression, GERD, seizure disorder and previous SDH, hypertension, tardive dyskinesia, and breast cancer status post lumpectomy, UTI, ?  ?OT comments ? This 76 yo female admitted with above presents to acute OT with making progress with LBD, standing at sink for 2 grooming tasks, and ambulation; however she is not at a point where she can manage safely by herself at home. Rehab at SNF is still recommended. We will continue to follow.  ? ?Recommendations for follow up therapy are one component of a multi-disciplinary discharge planning process, led by the attending physician.  Recommendations may be updated based on patient status, additional functional criteria and insurance authorization. ?   ?Follow Up Recommendations ? Skilled nursing-short term rehab (<3 hours/day)  ?  ?Assistance Recommended at Discharge Frequent or constant Supervision/Assistance  ?Patient can return home with the following ? A little help with walking and/or transfers;A little help with bathing/dressing/bathroom;Assist for transportation;Help with stairs or ramp for entrance;Assistance with cooking/housework;Direct supervision/assist for medications management;Direct supervision/assist for financial management ?  ?Equipment Recommendations ? None recommended by OT  ?  ?   ?Precautions / Restrictions Precautions ?Precautions: Fall ?Restrictions ?Weight Bearing Restrictions: No  ? ? ?  ? ?Mobility Bed Mobility ?Overal bed mobility: Needs Assistance ?Bed Mobility: Supine to Sit, Sit to Supine ?  ?  ?Supine to sit: Supervision ?Sit to  supine: Supervision ?  ?  ?  ? ?Transfers ?Overall transfer level: Needs assistance ?Equipment used: Rolling walker (2 wheels) ?Transfers: Sit to/from Stand ?Sit to Stand: Min guard ?  ?  ?  ?  ?  ?General transfer comment: pt ambulated 25 feet with RW with min A to manuver RW in a straight pattern ?  ?  ?Balance Overall balance assessment: Needs assistance ?Sitting-balance support: No upper extremity supported, Feet supported ?Sitting balance-Leahy Scale: Good ?  ?  ?Standing balance support: No upper extremity supported, During functional activity ?Standing balance-Leahy Scale: Fair ?Standing balance comment: standing at sink for grooming ?  ?  ?  ?  ?  ?  ?  ?  ?  ?  ?  ?   ? ?ADL either performed or assessed with clinical judgement  ? ?ADL Overall ADL's : Needs assistance/impaired ?  ?  ?Grooming: Min guard;Standing;Oral care;Wash/dry face ?Grooming Details (indicate cue type and reason): with brushing teeth pt was unaware when she was spitting she was not actually hitting the sink until she was cued she needed to lean over further ?  ?  ?  ?  ?  ?  ?Lower Body Dressing: Min guard;Sit to/from stand ?  ?  ?  ?  ?  ?  ?  ?  ?  ?  ? ?Extremity/Trunk Assessment Upper Extremity Assessment ?Upper Extremity Assessment: Overall WFL for tasks assessed ?  ?  ?  ?  ?  ? ?Vision Baseline Vision/History: 0 No visual deficits ?Ability to See in Adequate Light: 0 Adequate ?Patient Visual Report: No change from baseline ?  ?  ?   ?   ? ?Cognition Arousal/Alertness: Awake/alert ?Behavior During Therapy: Flat affect ?  Overall Cognitive Status: Impaired/Different from baseline ?Area of Impairment: Orientation, Attention, Following commands, Safety/judgement, Awareness, Problem solving ?  ?  ?  ?  ?  ?  ?  ?  ?Orientation Level: Disoriented to, Time (2024, the rest was correct) ?Current Attention Level: Sustained ?  ?Following Commands: Follows one step commands with increased time ?Safety/Judgement: Decreased awareness of safety,  Decreased awareness of deficits ?Awareness: Emergent ?Problem Solving: Requires verbal cues, Requires tactile cues, Difficulty sequencing ?  ?  ?  ?   ?   ?   ?   ? ? ?Pertinent Vitals/ Pain       Pain Assessment ?Pain Assessment: 0-10 ?Pain Score: 7  ?Pain Location: headache ?Pain Descriptors / Indicators: Aching, Headache ?Pain Intervention(s): Limited activity within patient's tolerance, Monitored during session, Repositioned ? ?   ?   ? ?Frequency ? Min 2X/week  ? ? ? ? ?  ?Progress Toward Goals ? ?OT Goals(current goals can now be found in the care plan section) ? Progress towards OT goals: Progressing toward goals ? ?Acute Rehab OT Goals ?OT Goal Formulation: With patient ?Time For Goal Achievement: 05/01/21 ?Potential to Achieve Goals: Good  ?Plan Discharge plan remains appropriate   ? ?   ?AM-PAC OT "6 Clicks" Daily Activity     ?Outcome Measure ? ? Help from another person eating meals?: None ?Help from another person taking care of personal grooming?: A Little ?Help from another person toileting, which includes using toliet, bedpan, or urinal?: A Little ?Help from another person bathing (including washing, rinsing, drying)?: A Little ?Help from another person to put on and taking off regular upper body clothing?: A Little ?Help from another person to put on and taking off regular lower body clothing?: A Little ?6 Click Score: 19 ? ?  ?End of Session Equipment Utilized During Treatment: Gait belt;Rolling walker (2 wheels) ? ?OT Visit Diagnosis: Other abnormalities of gait and mobility (R26.89);Unsteadiness on feet (R26.81);Repeated falls (R29.6);Muscle weakness (generalized) (M62.81);History of falling (Z91.81);Other symptoms and signs involving cognitive function ?  ?Activity Tolerance Patient tolerated treatment well ?  ?Patient Left in bed;with call bell/phone within reach;with bed alarm set;with restraints reapplied ?  ?   ? ?   ? ?Time: 7353-2992 ?OT Time Calculation (min): 30 min ? ?Charges: OT  General Charges ?$OT Visit: 1 Visit ?OT Treatments ?$Self Care/Home Management : 23-37 mins ?[place], OTR/L ?Acute Rehab Services ?Pager [phone] ?Office [phone] ? ? ? ?Almon Register ?04/23/2021, 6:16 PM ?

## 2021-04-23 NOTE — Progress Notes (Signed)
PROGRESS NOTE    Mindy Young  LEX:[id number] DOB: 06-18-1945 DOA: 04/15/2021 PCP: Loura Pardon, MD    Brief Narrative:  Mindy Young is a 76 y.o. Caucasian female with medical history significant for alcohol abuse, anxiety, depression, GERD, seizure disorder and previous SDH, hypertension, tardive dyskinesia and breast cancer status post lumpectomy, who presented to the emergency room with acute onset of recurrent falls.   CT Head revealed acute subdural hemorrhage layering along the left tentorial leaflet that is 3 mm thick and along the right cerebral convexity that is 2 mm thick with no significant mass effect.  Maxillofacial CT which was motion limited without evidence of fracture. C-spine CT showed no evidence for acute fracture or traumatic malalignment.  It showed severe multilevel degenerative change and emphysema.  Repeat CT head showed nnlarging low-attenuation subdural hematoma along the left cerebral convexity resulting in increasing mass effect upon the left cerebral hemisphere and developing 4 mm left-to-right midline shift, stable 3 mm subdural hematoma along the left tentorium. On call physician Dr Cyd Silence discussed with neurosurgery and suggested that these findings are chronic. Recommended outpatient follow up with Dr Reatha Armour in 2 weeks.  Hospital course also complicated by confusion and alcohol withdrawals.  Seen by PT and OT recommended SNF placement.  Patient currently not able to make her own medical decisions per psychiatry and given previous PCP conversations that her next fall could be fatal she is not safe for discharge home.      Assessment & Plan:   Assessment and Plan: * Subdural hematoma Patient presenting to ED with confusion, following fall and was found to have acute and chronic subdural hematomas likely complicated by alcohol intoxication.  Was evaluated by neurosurgery with repeat CT head and no further intervention recommended at this time; with outpatient  follow-up with neurosurgery, Dr. Reatha Armour in 2 weeks.    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 PCP called and said he is worried of her well being and currently is not safe for discharge home.  Was evaluated by psychiatry on 04/20/2021, currently does not have capacity for medical decision making. --Continue to encourage alcohol cessation --OT/SLP for cognitive evaluation       Seizure ([place]) No seizure episodes since admission, per pharmacy patient is not taking phenobarbital.  --Keppra 1000 mg BID   Hyponatremia Etiology likely secondary to EtOH use disorder.  Not on a thiazide diuretic outpatient sodium 120 on admission. --Na 120>>127>130>128>126 --Serum osmolality 264 --Check urine sodium, urine osmolality --Fluid restriction 1500 mL per day --Continue to encourage EtOH cessation --Continue intermittent monitoring of sodium level   Alcohol abuse EtOH level elevated 62 on admission. --Multivitamin, thiamine, folic acid --Continue encourage alcohol cessation  Hypertension --Losartan 50 mg p.o. daily --Metoprolol succinate 50 mg p.o. daily  Anemia Macrocytic,. Hemoglobin stable around 10.  --Continue vitamin b12 supplementation.   Hypomagnesemia Magnesium level 1.6 this am.  Will replete today with 4g IV mag --continue magnesium oxide 400mg  PO BID. --Repeat electrolytes including magnesium in a.m.  COPD (chronic obstructive pulmonary disease) (HCC) Stable, no acute exacerbation. --Albuterol neb as needed  Face lacerations Patient with facial laceration to right side.  Seen by ENT, Dr. Janace Hoard; and given that this is likely an old facial laceration with eschar and exudate already formed in the base, recommend wound care, cleaning with hydroperoxide once a day and saline gauze to the wound twice a day.  Recommended antibiotic  course for possible underlying  infection and once wound heals can consider revision at that time. --Continue wound care --Keflex 500 mg p.o. twice daily  Encounter for assessment of decision-making capacity Evaluated by psychiatry on 2/28 with findings that patient does not possess capacity for medical decision-making at this time.  Was seen by speech therapy on 3/3 with SLUMS 12/30 consistent with significant cognitive impairment.  Etiology likely secondary to her prolonged abuse of alcohol.  Patient is unable to appropriately care for herself, unable to manage her chronic comorbidities and perform ADLs alone.  ETOH abuse Patient continues excessive use of hard liquor outpatient with [employer].  Counseled on several occasions for need for complete cessation.     DVT prophylaxis: SCDs Start: 04/15/21 1935    Code Status: Full Code Family Communication: No family present at bedside this morning; updated patients daughter, [name] via telephone this morning  Disposition Plan:  Level of care: Med-Surg Status is: Inpatient Remains inpatient appropriate because: Pending SNF placement     Consultants:  Neurosurgery, Dr. Reatha Armour Psychiatry ENT, Dr. Janace Hoard  Procedures:  None  Antimicrobials:  Keflex 2/28>> Ancef 2/24 - 2/28    Subjective: Patient seen examined bedside, resting comfortably.  Sitter present.  Patient sleeping but arousable.  Discussed with primary nurse at bedside, will attempt to discontinue sitter today but will continue lapbelt for sprained due to her continuous efforts to get out of the bed.  Primary RN concerned about losing the sitter, states she has 6 patients and is difficult to be in the room at all times.  Given that patient is lying comfortably right now in the bed, not attempting to get out discussed with both the sitter and the RN that we need to attempt to remove sitter at this time.  No other questions or concerns at this time.  Updated patient's daughter extensively via telephone  yesterday afternoon.  Denies headache, no vision changes, no chest pain, no palpitations, no shortness of breath, no abdominal pain, no weakness, no fatigue, no paresthesias.  No acute events overnight per nursing staff.  Continues to lack capacity for medical decision-making.  Objective: Vitals:   04/22/21 1528 04/22/21 1600 04/22/21 2226 04/23/21 0436  BP: (!) 144/93  (!) 176/95 (!) 145/78  Pulse: 81  79 67  Resp: 18  16 17   Temp: 98.2 F (36.8 C)  (!) 97.4 F (36.3 C) 97.7 F (36.5 C)  TempSrc: Oral  Oral Oral  SpO2: 99% 98% 98% 100%  Weight:      Height:        Intake/Output Summary (Last 24 hours) at 04/23/2021 1128 Last data filed at 04/23/2021 0840 Gross per 24 hour  Intake 340 ml  Output 1000 ml  Net -660 ml   Filed Weights   04/15/21 1451  Weight: 61.2 kg    Examination:  Physical Exam: GEN: NAD, alert and oriented x 3, chronically ill in appearance, appears older than stated age with multiple areas of ecchymosis, skin tears in various stages of healing HEENT:  PERRL, EOMI, sclera clear, MMM, noted right facial wound without erythema/fluctuance PULM: CTAB w/o wheezes/crackles, normal respiratory effort, on room air CV: RRR w/o M/G/R GI: abd soft, NTND, NABS, no R/G/M MSK: no peripheral edema, muscle strength globally intact 5/5 bilateral upper/lower extremities NEURO: CN II-XII intact, no focal deficits, sensation to light touch intact PSYCH: Depressed mood, flat affect Integumentary: Multiple skin tears to extremities in various stages of healing, multiple areas of ecchymosis to extremities, noted  right facial wound without erythema/fluctuance         Data Reviewed: I have personally reviewed following labs and imaging studies  CBC: Recent Labs  Lab 04/17/21 0119 04/18/21 0214 04/19/21 0308  WBC 4.5 5.3 4.5  HGB 10.5* 10.0* 10.0*  HCT 30.2* 28.7* 28.6*  MCV 102.7* 103.6* 101.1*  PLT 277 271 725   Basic Metabolic Panel: Recent Labs  Lab  04/19/21 0308 04/20/21 0917 04/21/21 0123 04/22/21 0125 04/23/21 0514  NA 130* 127* 130* 128* 126*  K 4.4 3.7 3.6 4.4 4.2  CL 92* 91* 95* 94* 93*  CO2 26 26 26 24 24   GLUCOSE 100* 131* 106* 101* 96  BUN 6* 6* 7* 5* <5*  CREATININE 0.80 0.63 0.66 0.55 0.47  CALCIUM 9.0 8.6* 8.8* 8.8* 8.7*  MG 1.5* 1.4* 1.8 1.3* 1.6*  PHOS  --  4.3  --   --   --    GFR: Estimated Creatinine Clearance: 52.3 mL/min (by C-G formula based on SCr of 0.47 mg/dL). Liver Function Tests: Recent Labs  Lab 04/21/21 0123  AST 25  ALT 15  ALKPHOS 78  BILITOT 0.2*  PROT 5.0*  ALBUMIN 2.6*   No results for input(s): LIPASE, AMYLASE in the last 168 hours. No results for input(s): AMMONIA in the last 168 hours. Coagulation Profile: No results for input(s): INR, PROTIME in the last 168 hours. Cardiac Enzymes: No results for input(s): CKTOTAL, CKMB, CKMBINDEX, TROPONINI in the last 168 hours. BNP (last 3 results) No results for input(s): PROBNP in the last 8760 hours. HbA1C: No results for input(s): HGBA1C in the last 72 hours. CBG: No results for input(s): GLUCAP in the last 168 hours.  Lipid Profile: No results for input(s): CHOL, HDL, LDLCALC, TRIG, CHOLHDL, LDLDIRECT in the last 72 hours. Thyroid Function Tests: No results for input(s): TSH, T4TOTAL, FREET4, T3FREE, THYROIDAB in the last 72 hours. Anemia Panel: No results for input(s): VITAMINB12, FOLATE, FERRITIN, TIBC, IRON, RETICCTPCT in the last 72 hours. Sepsis Labs: No results for input(s): PROCALCITON, LATICACIDVEN in the last 168 hours.  Recent Results (from the past 240 hour(s))  Resp Panel by RT-PCR (Flu A&B, Covid) Nasopharyngeal Swab     Status: None   Collection Time: 04/15/21  7:35 PM   Specimen: Nasopharyngeal Swab; Nasopharyngeal(NP) swabs in vial transport medium  Result Value Ref Range Status   SARS Coronavirus 2 by RT PCR NEGATIVE NEGATIVE Final    Comment: (NOTE) SARS-CoV-2 target nucleic acids are NOT DETECTED.  The  SARS-CoV-2 RNA is generally detectable in upper respiratory specimens during the acute phase of infection. The lowest concentration of SARS-CoV-2 viral copies this assay can detect is 138 copies/mL. A negative result does not preclude SARS-Cov-2 infection and should not be used as the sole basis for treatment or other patient management decisions. A negative result may occur with  improper specimen collection/handling, submission of specimen other than nasopharyngeal swab, presence of viral mutation(s) within the areas targeted by this assay, and inadequate number of viral copies(<138 copies/mL). A negative result must be combined with clinical observations, patient history, and epidemiological information. The expected result is Negative.  Fact Sheet for Patients:  EntrepreneurPulse.com.au  Fact Sheet for Healthcare Providers:  IncredibleEmployment.be  This test is no t yet approved or cleared by the [place] FDA and  has been authorized for detection and/or diagnosis of SARS-CoV-2 by FDA under an Emergency Use Authorization (EUA). This EUA will remain  in effect (meaning this test can be used) for  the duration of the COVID-19 declaration under Section 564(b)(1) of the Act, 21 U.S.C.section 360bbb-3(b)(1), unless the authorization is terminated  or revoked sooner.       Influenza A by PCR NEGATIVE NEGATIVE Final   Influenza B by PCR NEGATIVE NEGATIVE Final    Comment: (NOTE) The Xpert Xpress SARS-CoV-2/FLU/RSV plus assay is intended as an aid in the diagnosis of influenza from Nasopharyngeal swab specimens and should not be used as a sole basis for treatment. Nasal washings and aspirates are unacceptable for Xpert Xpress SARS-CoV-2/FLU/RSV testing.  Fact Sheet for Patients: EntrepreneurPulse.com.au  Fact Sheet for Healthcare Providers: IncredibleEmployment.be  This test is not yet approved or  cleared by the [place] FDA and has been authorized for detection and/or diagnosis of SARS-CoV-2 by FDA under an Emergency Use Authorization (EUA). This EUA will remain in effect (meaning this test can be used) for the duration of the COVID-19 declaration under Section 564(b)(1) of the Act, 21 U.S.C. section 360bbb-3(b)(1), unless the authorization is terminated or revoked.  Performed at [facility], [place] [address] [place], [place] [address]          Radiology Studies: No results found.      Scheduled Meds:  cephALEXin  500 mg Oral V49S   folic acid  1 mg Oral Daily   levETIRAcetam  1,000 mg Oral BID   losartan  50 mg Oral Daily   magnesium oxide  400 mg Oral BID   melatonin  10 mg Oral QHS   metoprolol succinate  50 mg Oral Daily   multivitamin with minerals  1 tablet Oral Daily   neomycin-bacitracin-polymyxin   Topical BID   oxybutynin  10 mg Oral q morning   QUEtiapine  12.5 mg Oral QHS   sodium chloride flush  10-40 mL Intracatheter Q12H   sodium chloride  1 g Oral BID WC   thiamine  100 mg Oral Daily   vitamin B-12  3,000 mcg Oral q morning   Continuous Infusions:  magnesium sulfate bolus IVPB        LOS: 8 days    Time spent: 44 minutes spent on chart review, discussion with nursing staff, consultants, updating family and interview/physical exam; more than 50% of that time was spent in counseling and/or coordination of care.    Faun Mcqueen J [place] DO [facility] Available via Epic secure chat 7am-7pm After these hours, please refer to coverage provider listed on [email or web address] 04/23/2021, 11:28 AM

## 2021-04-23 NOTE — Plan of Care (Signed)
?  Problem: Clinical Measurements: ?Goal: Will remain free from infection ?Outcome: Not Progressing ?  ?Problem: Coping: ?Goal: Level of anxiety will decrease ?Outcome: Not Progressing ?  ?Problem: Safety: ?Goal: Ability to remain free from injury will improve ?Outcome: Not Progressing ?  ?Problem: Skin Integrity: ?Goal: Risk for impaired skin integrity will decrease ?Outcome: Not Progressing ?  ?Problem: Safety: ?Goal: Non-violent Restraint(s) ?Outcome: Not Progressing ?  ?

## 2021-04-23 NOTE — Assessment & Plan Note (Signed)
Evaluated by psychiatry on 2/28 with findings that patient does not possess capacity for medical decision-making at this time.  Was seen by speech therapy on 3/3 with SLUMS 12/30 consistent with significant cognitive impairment.  Etiology likely secondary to her prolonged abuse of alcohol.  Patient is unable to appropriately care for herself, unable to manage her chronic comorbidities and perform ADLs alone. ?

## 2021-04-24 LAB — BASIC METABOLIC PANEL
Anion gap: 7 (ref 5–15)
BUN: <5 mg/dL — ABNORMAL LOW (ref 8–23)
CO2: 23 mmol/L (ref 22–32)
Calcium: 8.6 mg/dL — ABNORMAL LOW (ref 8.9–10.3)
Chloride: 93 mmol/L — ABNORMAL LOW (ref 98–111)
Creatinine, Ser: 0.64 mg/dL (ref 0.44–1.00)
GFR, Estimated: >60 mL/min (ref >60)
Glucose, Bld: 108 mg/dL — ABNORMAL HIGH (ref 70–99)
Potassium: 3.9 mmol/L (ref 3.5–5.1)
Sodium: 123 mmol/L — ABNORMAL LOW (ref 135–145)

## 2021-04-24 LAB — MAGNESIUM: Magnesium: 2 mg/dL (ref 1.7–2.4)

## 2021-04-24 MED ORDER — SODIUM CHLORIDE 0.9 % IV SOLN: INTRAVENOUS | Status: AC

## 2021-04-24 NOTE — Progress Notes (Signed)
PROGRESS NOTE    Mindy Young  ZOX:[id number] DOB: 06-17-1945 Mindy Young PCP: Mindy Pardon, MD    Brief Narrative:  Mindy Young is a 76 y.o. Caucasian female with medical history significant for alcohol abuse, anxiety, depression, GERD, seizure disorder and previous SDH, hypertension, tardive dyskinesia and breast cancer status post lumpectomy, who presented to the emergency room with acute onset of recurrent falls.   CT Head revealed acute subdural hemorrhage layering along the left tentorial leaflet that is 3 mm thick and along the right cerebral convexity that is 2 mm thick with no significant mass effect.  Maxillofacial CT which was motion limited without evidence of fracture. C-spine CT showed no evidence for acute fracture or traumatic malalignment.  It showed severe multilevel degenerative change and emphysema.  Repeat CT head showed nnlarging low-attenuation subdural hematoma along the left cerebral convexity resulting in increasing mass effect upon the left cerebral hemisphere and developing 4 mm left-to-right midline shift, stable 3 mm subdural hematoma along the left tentorium. On call physician Dr Mindy Young discussed with neurosurgery and suggested that these findings are chronic. Recommended outpatient follow up with Dr Mindy Young in 2 weeks.  Hospital course also complicated by confusion and alcohol withdrawals.  Seen by PT and OT recommended SNF placement.  Patient currently not able to make her own medical decisions per psychiatry and given previous PCP conversations that her next fall could be fatal she is not safe for discharge home.      Assessment & Plan:   Assessment and Plan: * Subdural hematoma Patient presenting to ED with confusion, following fall and was found to have acute and chronic subdural hematomas likely complicated by alcohol intoxication.  Was evaluated by neurosurgery with repeat CT head and no further intervention recommended at this time; with outpatient  follow-up with neurosurgery, Dr. Reatha Young in 2 weeks.    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 PCP called and said he is worried of her well being and currently is not safe for discharge home.  Was evaluated by psychiatry on 04/20/2021, currently does not have capacity for medical decision making. --Continue to encourage alcohol cessation --OT/SLP for cognitive evaluation       Seizure (Mindy Young) No seizure episodes since admission, per pharmacy patient is not taking phenobarbital.  --Keppra 1000 mg BID   Hyponatremia Etiology likely secondary to EtOH use disorder.  Not on a thiazide diuretic outpatient sodium 120 on admission. --Na 120>>127>130>128>126>123 --Serum osmolality 264 --urine sodium, urine osmolality ordered and pending --Continue to encourage EtOH cessation --Continue intermittent monitoring of sodium level   Alcohol abuse EtOH level elevated 62 on admission. --Multivitamin, thiamine, folic acid --Continue encourage alcohol cessation  Hypertension --Losartan 50 mg p.o. daily --Metoprolol succinate 50 mg p.o. daily  Anemia Macrocytic,. Hemoglobin stable around 10.  --Continue vitamin b12 supplementation.   Hypomagnesemia Magnesium level 2.0 this am.   --continue magnesium oxide '400mg'$  PO BID. --Repeat electrolytes including magnesium in a.m.  COPD (chronic obstructive pulmonary disease) (HCC) Stable, no acute exacerbation. --Albuterol neb as needed  Face lacerations Patient with facial laceration to right side.  Seen by ENT, Dr. Janace Young; and given that this is likely an old facial laceration with eschar and exudate already formed in the base, recommend wound care, cleaning with hydroperoxide once a day and saline gauze to the wound twice a day.  Recommended antibiotic course for possible underlying infection and once wound heals can  consider revision  at that time.  Completed course of antibiotics with Keflex. --Continue wound care  Encounter for assessment of decision-making capacity Evaluated by psychiatry on 2/28 with findings that patient does not possess capacity for medical decision-making at this time.  Was seen by speech therapy on 3/3 with [facility]MS 12/30 consistent with significant cognitive impairment.  Etiology likely secondary to her prolonged abuse of alcohol.  Patient is unable to appropriately care for herself, unable to manage her chronic comorbidities and perform ADLs alone.  ETOH abuse Patient continues excessive use of hard liquor outpatient with Mindy Young.  Counseled on several occasions for need for complete cessation.     DVT prophylaxis: SCDs Start: 04/15/21 1935    Code Status: Full Code Family Communication: No family present at bedside this morning  Disposition Plan:  Level of care: Med-Surg Status is: Inpatient Remains inpatient appropriate because: Pending SNF placement     Consultants:  Neurosurgery, Dr. Reatha Young Psychiatry ENT, Dr. Janace Young  Procedures:  None  Antimicrobials:  Keflex 2/28>> Ancef 2/24 - 2/28    Subjective: Patient seen examined bedside, resting comfortably.  Eating breakfast.  No complaints this morning.  Continues to have a lack of insight regarding her chronic medical conditions, alcohol abuse and overall functional decline.  Sitter was discontinued yesterday, no concerns overnight per nursing staff.  We will discontinue lapbelt restraint today.  No family present.  Hopeful for discharge to SNF this coming week. Denies headache, no vision changes, no chest pain, no palpitations, no shortness of breath, no abdominal pain, no weakness, no fatigue, no paresthesias.  No acute events overnight per nursing staff.  Continues to lack capacity for medical decision-making.  Objective: Vitals:   04/24/21 0410 04/24/21 0806 04/24/21 1038 04/24/21 1143  BP: 126/76 125/75 130/74 132/82   Pulse: 72 74 75 81  Resp: '18 16  16  '$ Temp: 97.9 F (36.6 C) 97.6 F (36.4 C)  98.5 F (36.9 C)  TempSrc: Oral Oral  Oral  SpO2: 100% 97%  95%  Weight:      Height:        Intake/Output Summary (Last 24 hours) at 04/24/2021 1144 Last data filed at 04/24/2021 0429 Gross per 24 hour  Intake 336 ml  Output 900 ml  Net -564 ml   Filed Weights   04/15/21 1451  Weight: 61.2 kg    Examination:  Physical Exam: GEN: NAD, alert and oriented x 3, chronically ill in appearance, appears older than stated age with multiple areas of ecchymosis, skin tears in various stages of healing HEENT:  PERRL, EOMI, sclera clear, MMM, noted right facial wound without erythema/fluctuance PULM: CTAB w/o wheezes/crackles, normal respiratory effort, on room air CV: RRR w/o M/G/R GI: abd soft, NTND, NABS, no R/G/M MSK: no peripheral edema, muscle strength globally intact 5/5 bilateral upper/lower extremities NEURO: CN II-XII intact, no focal deficits, sensation to light touch intact PSYCH: Depressed mood, flat affect Integumentary: Multiple skin tears to extremities in various stages of healing, multiple areas of ecchymosis to extremities, noted right facial wound without erythema/fluctuance         Data Reviewed: I have personally reviewed following labs and imaging studies  CBC: Recent Labs  Lab 04/18/21 0214 04/19/21 0308  WBC 5.3 4.5  HGB 10.0* 10.0*  HCT 28.7* 28.6*  MCV 103.6* 101.1*  PLT 271 025   Basic Metabolic Panel: Recent Labs  Lab 04/20/21 0917 04/21/21 0123 04/22/21 0125 04/23/21 0514 04/24/21 0048  NA 127* 130* 128* 126* 123*  K 3.7 3.6 4.4 4.2 3.9  CL 91* 95* 94* 93* 93*  CO2 '26 26 24 24 23  '$ GLUCOSE 131* 106* 101* 96 108*  BUN 6* 7* 5* <5* <5*  CREATININE 0.63 0.66 0.55 0.47 0.64  CALCIUM 8.6* 8.8* 8.8* 8.7* 8.6*  MG 1.4* 1.8 1.3* 1.6* 2.0  PHOS 4.3  --   --   --   --    GFR: Estimated Creatinine Clearance: 52.3 mL/min (by C-G formula based on SCr of 0.64  mg/dL). Liver Function Tests: Recent Labs  Lab 04/21/21 0123  AST 25  ALT 15  ALKPHOS 78  BILITOT 0.2*  PROT 5.0*  ALBUMIN 2.6*   No results for input(s): LIPASE, AMYLASE in the last 168 hours. No results for input(s): AMMONIA in the last 168 hours. Coagulation Profile: No results for input(s): INR, PROTIME in the last 168 hours. Cardiac Enzymes: No results for input(s): CKTOTAL, CKMB, CKMBINDEX, TROPONINI in the last 168 hours. BNP (last 3 results) No results for input(s): PROBNP in the last 8760 hours. HbA1C: No results for input(s): HGBA1C in the last 72 hours. CBG: No results for input(s): GLUCAP in the last 168 hours.  Lipid Profile: No results for input(s): CHOL, HDL, LDLCALC, TRIG, CHOLHDL, LDLDIRECT in the last 72 hours. Thyroid Function Tests: No results for input(s): TSH, T4TOTAL, FREET4, T3FREE, THYROIDAB in the last 72 hours. Anemia Panel: No results for input(s): VITAMINB12, FOLATE, FERRITIN, TIBC, IRON, RETICCTPCT in the last 72 hours. Sepsis Labs: No results for input(s): PROCALCITON, LATICACIDVEN in the last 168 hours.  Recent Results (from the past 240 hour(s))  Resp Panel by RT-PCR (Flu A&B, Covid) Nasopharyngeal Swab     Status: None   Collection Time: 04/15/21  7:35 PM   Specimen: Nasopharyngeal Swab; Nasopharyngeal(NP) swabs in vial transport medium  Result Value Ref Range Status   SARS Coronavirus 2 by RT PCR NEGATIVE NEGATIVE Final    Comment: (NOTE) SARS-CoV-2 target nucleic acids are NOT DETECTED.  The SARS-CoV-2 RNA is generally detectable in upper respiratory specimens during the acute phase of infection. The lowest concentration of SARS-CoV-2 viral copies this assay can detect is 138 copies/mL. A negative result does not preclude SARS-Cov-2 infection and should not be used as the sole basis for treatment or other patient management decisions. A negative result may occur with  improper specimen collection/handling, submission of specimen  other than nasopharyngeal swab, presence of viral mutation(s) within the areas targeted by this assay, and inadequate number of viral copies(<138 copies/mL). A negative result must be combined with clinical observations, patient history, and epidemiological information. The expected result is Negative.  Fact Sheet for Patients:  EntrepreneurPulse.com.au  Fact Sheet for Healthcare Providers:  IncredibleEmployment.be  This test is no t yet approved or cleared by the [place] FDA and  has been authorized for detection and/or diagnosis of SARS-CoV-2 by FDA under an Emergency Use Authorization (EUA). This EUA will remain  in effect (meaning this test can be used) for the duration of the COVID-19 declaration under Section 564(b)(1) of the Act, 21 U.S.C.section 360bbb-3(b)(1), unless the authorization is terminated  or revoked sooner.       Influenza A by PCR NEGATIVE NEGATIVE Final   Influenza B by PCR NEGATIVE NEGATIVE Final    Comment: (NOTE) The Xpert Xpress SARS-CoV-2/FLU/RSV plus assay is intended as an aid in the diagnosis of influenza from Nasopharyngeal swab specimens and should not be used as a sole basis for treatment. Nasal washings and aspirates are unacceptable  for Xpert Xpress SARS-CoV-2/FLU/RSV testing.  Fact Sheet for Patients: EntrepreneurPulse.com.au  Fact Sheet for Healthcare Providers: IncredibleEmployment.be  This test is not yet approved or cleared by the [place] FDA and has been authorized for detection and/or diagnosis of SARS-CoV-2 by FDA under an Emergency Use Authorization (EUA). This EUA will remain in effect (meaning this test can be used) for the duration of the COVID-19 declaration under Section 564(b)(1) of the Act, 21 U.S.C. section 360bbb-3(b)(1), unless the authorization is terminated or revoked.  Performed at [facility], [place] [address] [place],  [place] [address]          Radiology Studies: No results found.      Scheduled Meds:  folic acid  1 mg Oral Daily   Gerhardt's butt cream   Topical BID   levETIRAcetam  1,000 mg Oral BID   losartan  50 mg Oral Daily   magnesium oxide  400 mg Oral BID   melatonin  10 mg Oral QHS   metoprolol succinate  50 mg Oral Daily   multivitamin with minerals  1 tablet Oral Daily   neomycin-bacitracin-polymyxin   Topical BID   oxybutynin  10 mg Oral q morning   QUEtiapine  12.5 mg Oral QHS   sodium chloride flush  10-40 mL Intracatheter Q12H   sodium chloride  1 g Oral BID WC   thiamine  100 mg Oral Daily   vitamin B-12  3,000 mcg Oral q morning   Continuous Infusions:  sodium chloride 125 mL/hr at 04/24/21 0749      LOS: 9 days    Time spent: 44 minutes spent on chart review, discussion with nursing staff, consultants, updating family and interview/physical exam; more than 50% of that time was spent in counseling and/or coordination of care.    Ceil Roderick J [place] DO [facility] Available via Epic secure chat 7am-7pm After these hours, please refer to coverage provider listed on [email or web address] 04/24/2021, 11:44 AM

## 2021-04-25 LAB — BASIC METABOLIC PANEL
Anion gap: 8 (ref 5–15)
BUN: <5 mg/dL — ABNORMAL LOW (ref 8–23)
CO2: 22 mmol/L (ref 22–32)
Calcium: 8.5 mg/dL — ABNORMAL LOW (ref 8.9–10.3)
Chloride: 98 mmol/L (ref 98–111)
Creatinine, Ser: 0.54 mg/dL (ref 0.44–1.00)
GFR, Estimated: >60 mL/min (ref >60)
Glucose, Bld: 90 mg/dL (ref 70–99)
Potassium: 3.9 mmol/L (ref 3.5–5.1)
Sodium: 128 mmol/L — ABNORMAL LOW (ref 135–145)

## 2021-04-25 LAB — MAGNESIUM: Magnesium: 1.6 mg/dL — ABNORMAL LOW (ref 1.7–2.4)

## 2021-04-25 MED ORDER — HYDRALAZINE HCL 25 MG PO TABS: 25.0000 mg | ORAL_TABLET | Freq: Four times a day (QID) | ORAL | Status: DC | PRN

## 2021-04-25 MED ORDER — MAGNESIUM OXIDE -MG SUPPLEMENT 400 (240 MG) MG PO TABS
800.0000 mg | ORAL_TABLET | Freq: Two times a day (BID) | ORAL | Status: DC
  Administered 2021-04-25 – 2021-04-27 (×5): 800 mg via ORAL
  Filled 2021-04-25 (×5): qty 2

## 2021-04-25 MED ORDER — LOSARTAN POTASSIUM 50 MG PO TABS: 75.0000 mg | ORAL_TABLET | Freq: Every day | ORAL | Status: DC

## 2021-04-25 NOTE — Progress Notes (Signed)
BP in dinamap is 165/87, did repeat it manually and it's 170/80. She said she is having a frontal headache with pain score of 7/10. Offered an ice pack first because PRN Tylenol is not yet due until 2130H. Messaged Dr. Myna Hidalgo and he did ordered a Hydralazine tab PRN for SBP greater than 180. ?

## 2021-04-25 NOTE — Plan of Care (Signed)
?  Problem: Health Behavior/Discharge Planning: Goal: Ability to manage health-related needs will improve Outcome: Progressing   Problem: Safety: Goal: Ability to remain free from injury will improve Outcome: Progressing   Problem: Skin Integrity: Goal: Risk for impaired skin integrity will decrease Outcome: Progressing   

## 2021-04-25 NOTE — Progress Notes (Signed)
PROGRESS NOTE    Mindy Young  WLN:[id number] DOB: 06-09-1945 DOA: 04/15/2021 PCP: Mindy Pardon, MD    Brief Narrative:  Mindy Young is a 76 y.o. Caucasian female with medical history significant for alcohol abuse, anxiety, depression, GERD, seizure disorder and previous SDH, hypertension, tardive dyskinesia and breast cancer status post lumpectomy, who presented to the emergency room with acute onset of recurrent falls.   CT Head revealed acute subdural hemorrhage layering along the left tentorial leaflet that is 3 mm thick and along the right cerebral convexity that is 2 mm thick with no significant mass effect.  Maxillofacial CT which was motion limited without evidence of fracture. C-spine CT showed no evidence for acute fracture or traumatic malalignment.  It showed severe multilevel degenerative change and emphysema.  Repeat CT head showed nnlarging low-attenuation subdural hematoma along the left cerebral convexity resulting in increasing mass effect upon the left cerebral hemisphere and developing 4 mm left-to-right midline shift, stable 3 mm subdural hematoma along the left tentorium. On call physician Dr Mindy Young discussed with neurosurgery and suggested that these findings are chronic. Recommended outpatient follow up with Dr Mindy Young in 2 weeks.  Hospital course also complicated by confusion and alcohol withdrawals.  Seen by PT and OT recommended SNF placement.  Patient currently not able to make her own medical decisions per psychiatry and given previous PCP conversations that her next fall could be fatal she is not safe for discharge home.      Assessment & Plan:   Assessment and Plan: * Subdural hematoma Patient presenting to ED with confusion, following fall and was found to have acute and chronic subdural hematomas likely complicated by alcohol intoxication.  Was evaluated by neurosurgery with repeat CT head and no further intervention recommended at this time; with outpatient  follow-up with neurosurgery, Dr. Reatha Young in 2 weeks.    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 PCP called and said he is worried of her well being and currently is not safe for discharge home.  Was evaluated by psychiatry on 04/20/2021, currently does not have capacity for medical decision making.  Seen by SLP for cognitive evaluation with score on SLUMS 12/30 --Continue to encourage alcohol cessation        Seizure (Mindy Young) No seizure episodes since admission, per pharmacy patient is not taking phenobarbital.  --Keppra 1000 mg BID   Hyponatremia Etiology likely secondary to EtOH use disorder.  Not on a thiazide diuretic outpatient sodium 120 on admission. --Na 120>>127>130>128>126>123>128 --Serum osmolality 264 --urine sodium, urine osmolality ordered and pending --Sodium chloride 1 g p.o. twice daily --Continue to encourage EtOH cessation --Continue intermittent monitoring of sodium level   Alcohol abuse EtOH level elevated 62 on admission. --Multivitamin, thiamine, folic acid --Continue encourage alcohol cessation  Hypertension --Losartan 75 mg p.o. daily --Metoprolol succinate 50 mg p.o. daily  Anemia Macrocytic,. Hemoglobin stable around 10.  --Continue vitamin b12 supplementation.   Hypomagnesemia Magnesium level 1.6 this am.   --Increase magnesium oxide to '800mg'$  PO BID. --Repeat electrolytes including magnesium in a.m.  COPD (chronic obstructive pulmonary disease) (HCC) Stable, no acute exacerbation. --Albuterol neb as needed  Face lacerations Patient with facial laceration to right side.  Seen by ENT, Dr. Janace Young; and given that this is likely an old facial laceration with eschar and exudate already formed in the base, recommend wound care, cleaning with hydroperoxide once a day and saline gauze to the  wound twice a day.  Recommended antibiotic course  for possible underlying infection and once wound heals can consider revision at that time.  Completed course of antibiotics with Keflex. --Continue wound care  Encounter for assessment of decision-making capacity Evaluated by psychiatry on 2/28 with findings that patient does not possess capacity for medical decision-making at this time.  Was seen by speech therapy on 3/3 with SLUMS 12/30 consistent with significant cognitive impairment.  Etiology likely secondary to her prolonged abuse of alcohol.  Patient is unable to appropriately care for herself, unable to manage her chronic comorbidities and perform ADLs alone.  ETOH abuse Patient continues excessive use of hard liquor outpatient with Mindy Young.  Counseled on several occasions for need for complete cessation.     DVT prophylaxis: SCDs Start: 04/15/21 1935    Code Status: Full Code Family Communication: No family present at bedside this morning  Disposition Plan:  Level of care: Med-Surg Status is: Inpatient Remains inpatient appropriate because: Pending SNF placement     Consultants:  Neurosurgery, Dr. Reatha Young Psychiatry ENT, Dr. Janace Young  Procedures:  None  Antimicrobials:  Keflex 2/28>> Ancef 2/24 - 2/28    Subjective: Patient seen examined bedside, resting comfortably.  Complaining of headache.  Has not taken any medication for this as of yet.  No other specific complaints or concerns at this time. Continues to have a lack of insight regarding her chronic medical conditions, alcohol abuse and overall functional decline.  Continues out of restraints and no sitter for greater than 48 hours.  No family present.  Awaiting for SNF placement, medically stable for discharge once bed available.  Denies vision changes, no chest pain, no palpitations, no shortness of breath, no abdominal pain, no weakness, no fatigue, no paresthesias.  No acute events overnight per nursing staff.  Continues to lack capacity for medical  decision-making.  Objective: Vitals:   04/24/21 2349 04/25/21 0345 04/25/21 0754 04/25/21 1138  BP: (!) 141/80 (!) 148/78 (!) 157/87 (!) 155/92  Pulse: 73 69 72 66  Resp: '16 16 16 16  '$ Temp: 98 F (36.7 C) 97.9 F (36.6 C) 97.7 F (36.5 C) 97.6 F (36.4 C)  TempSrc: Oral Oral Oral Oral  SpO2: 98% 100% 100% 98%  Weight:      Height:        Intake/Output Summary (Last 24 hours) at 04/25/2021 1143 Last data filed at 04/25/2021 0757 Gross per 24 hour  Intake 677 ml  Output 1350 ml  Net -673 ml   Filed Weights   04/15/21 1451  Weight: 61.2 kg    Examination:  Physical Exam: GEN: NAD, alert and oriented x 3, chronically ill in appearance, appears older than stated age with multiple areas of ecchymosis, skin tears in various stages of healing HEENT:  PERRL, EOMI, sclera clear, MMM, noted right facial wound without erythema/fluctuance PULM: CTAB w/o wheezes/crackles, normal respiratory effort, on room air CV: RRR w/o M/G/R GI: abd soft, NTND, NABS, no R/G/M MSK: no peripheral edema, muscle strength globally intact 5/5 bilateral upper/lower extremities NEURO: CN II-XII intact, no focal deficits, sensation to light touch intact PSYCH: Depressed mood, flat affect Integumentary: Multiple skin tears to extremities in various stages of healing, multiple areas of ecchymosis to extremities, noted right facial wound without erythema/fluctuance         Data Reviewed: I have personally reviewed following labs and imaging studies  CBC: Recent Labs  Lab 04/19/21 0308  WBC 4.5  HGB 10.0*  HCT 28.6*  MCV  101.1*  PLT 814   Basic Metabolic Panel: Recent Labs  Lab 04/20/21 0917 04/21/21 0123 04/22/21 0125 04/23/21 0514 04/24/21 0048 04/25/21 0318  NA 127* 130* 128* 126* 123* 128*  K 3.7 3.6 4.4 4.2 3.9 3.9  CL 91* 95* 94* 93* 93* 98  CO2 '26 26 24 24 23 22  '$ GLUCOSE 131* 106* 101* 96 108* 90  BUN 6* 7* 5* <5* <5* <5*  CREATININE 0.63 0.66 0.55 0.47 0.64 0.54  CALCIUM  8.6* 8.8* 8.8* 8.7* 8.6* 8.5*  MG 1.4* 1.8 1.3* 1.6* 2.0 1.6*  PHOS 4.3  --   --   --   --   --    GFR: Estimated Creatinine Clearance: 52.3 mL/min (by C-G formula based on SCr of 0.54 mg/dL). Liver Function Tests: Recent Labs  Lab 04/21/21 0123  AST 25  ALT 15  ALKPHOS 78  BILITOT 0.2*  PROT 5.0*  ALBUMIN 2.6*   No results for input(s): LIPASE, AMYLASE in the last 168 hours. No results for input(s): AMMONIA in the last 168 hours. Coagulation Profile: No results for input(s): INR, PROTIME in the last 168 hours. Cardiac Enzymes: No results for input(s): CKTOTAL, CKMB, CKMBINDEX, TROPONINI in the last 168 hours. BNP (last 3 results) No results for input(s): PROBNP in the last 8760 hours. HbA1C: No results for input(s): HGBA1C in the last 72 hours. CBG: No results for input(s): GLUCAP in the last 168 hours.  Lipid Profile: No results for input(s): CHOL, HDL, LDLCALC, TRIG, CHOLHDL, LDLDIRECT in the last 72 hours. Thyroid Function Tests: No results for input(s): TSH, T4TOTAL, FREET4, T3FREE, THYROIDAB in the last 72 hours. Anemia Panel: No results for input(s): VITAMINB12, FOLATE, FERRITIN, TIBC, IRON, RETICCTPCT in the last 72 hours. Sepsis Labs: No results for input(s): PROCALCITON, LATICACIDVEN in the last 168 hours.  Recent Results (from the past 240 hour(s))  Resp Panel by RT-PCR (Flu A&B, Covid) Nasopharyngeal Swab     Status: None   Collection Time: 04/15/21  7:35 PM   Specimen: Nasopharyngeal Swab; Nasopharyngeal(NP) swabs in vial transport medium  Result Value Ref Range Status   SARS Coronavirus 2 by RT PCR NEGATIVE NEGATIVE Final    Comment: (NOTE) SARS-CoV-2 target nucleic acids are NOT DETECTED.  The SARS-CoV-2 RNA is generally detectable in upper respiratory specimens during the acute phase of infection. The lowest concentration of SARS-CoV-2 viral copies this assay can detect is 138 copies/mL. A negative result does not preclude SARS-Cov-2 infection and  should not be used as the sole basis for treatment or other patient management decisions. A negative result may occur with  improper specimen collection/handling, submission of specimen other than nasopharyngeal swab, presence of viral mutation(s) within the areas targeted by this assay, and inadequate number of viral copies(<138 copies/mL). A negative result must be combined with clinical observations, patient history, and epidemiological information. The expected result is Negative.  Fact Sheet for Patients:  EntrepreneurPulse.com.au  Fact Sheet for Healthcare Providers:  IncredibleEmployment.be  This test is no t yet approved or cleared by the [place] FDA and  has been authorized for detection and/or diagnosis of SARS-CoV-2 by FDA under an Emergency Use Authorization (EUA). This EUA will remain  in effect (meaning this test can be used) for the duration of the COVID-19 declaration under Section 564(b)(1) of the Act, 21 U.S.C.section 360bbb-3(b)(1), unless the authorization is terminated  or revoked sooner.       Influenza A by PCR NEGATIVE NEGATIVE Final   Influenza B by PCR  NEGATIVE NEGATIVE Final    Comment: (NOTE) The Xpert Xpress SARS-CoV-2/FLU/RSV plus assay is intended as an aid in the diagnosis of influenza from Nasopharyngeal swab specimens and should not be used as a sole basis for treatment. Nasal washings and aspirates are unacceptable for Xpert Xpress SARS-CoV-2/FLU/RSV testing.  Fact Sheet for Patients: EntrepreneurPulse.com.au  Fact Sheet for Healthcare Providers: IncredibleEmployment.be  This test is not yet approved or cleared by the [place] FDA and has been authorized for detection and/or diagnosis of SARS-CoV-2 by FDA under an Emergency Use Authorization (EUA). This EUA will remain in effect (meaning this test can be used) for the duration of the COVID-19 declaration  under Section 564(b)(1) of the Act, 21 U.S.C. section 360bbb-3(b)(1), unless the authorization is terminated or revoked.  Performed at [facility], [place] [address] [place], [place] [address]          Radiology Studies: No results found.      Scheduled Meds:  folic acid  1 mg Oral Daily   Gerhardt's butt cream   Topical BID   levETIRAcetam  1,000 mg Oral BID   [START ON 04/26/2021] losartan  75 mg Oral Daily   magnesium oxide  800 mg Oral BID   melatonin  10 mg Oral QHS   metoprolol succinate  50 mg Oral Daily   multivitamin with minerals  1 tablet Oral Daily   neomycin-bacitracin-polymyxin   Topical BID   oxybutynin  10 mg Oral q morning   QUEtiapine  12.5 mg Oral QHS   sodium chloride flush  10-40 mL Intracatheter Q12H   sodium chloride  1 g Oral BID WC   thiamine  100 mg Oral Daily   vitamin B-12  3,000 mcg Oral q morning   Continuous Infusions:      LOS: 10 days    Time spent: 44 minutes spent on chart review, discussion with nursing staff, consultants, updating family and interview/physical exam; more than 50% of that time was spent in counseling and/or coordination of care.    Tomislav Micale J [place] DO [facility] Available via Epic secure chat 7am-7pm After these hours, please refer to coverage provider listed on [email or web address] 04/25/2021, 11:43 AM

## 2021-04-25 NOTE — TOC Progression Note (Addendum)
Transition of Care (TOC) - Progression Note  ? ? ?Patient Details  ?Name: Mindy Young ?MRN: [id number] ?Date of Birth: [birth year] ? ?Transition of Care (TOC) CM/SW Contact  ?Courtnee Myer Renold Don, LCSWA ?Phone Number: ?04/25/2021, 2:06 PM ? ?Clinical Narrative:    ?CSW attempted to see pt in room, staff was in room working with pt and asked CSW to come back. CSW will follow up with pt for SNF choices. ? ?CSW went back to visit pt to give her SNF choices, pt stated she would make her decision for SNF but was not in the mood to speak at the moment.  ? ? ?Expected Discharge Plan: [place] ?Barriers to Discharge: Continued Medical Work up ? ?Expected Discharge Plan and Services ?Expected Discharge Plan: [place] ?In-house Referral: Clinical Social Work ?Discharge Planning Services: CM Consult ?  ?Living arrangements for the past 2 months: [place] ?                ?  ?  ?  ?  ?  ?  ?  ?  ?  ?  ? ? ?Social Determinants of Health (SDOH) Interventions ?  ? ?Readmission Risk Interventions ?Readmission Risk Prevention Plan 02/23/2021  ?Transportation Screening Complete  ?Medication Review Press photographer) Complete  ?PCP or Specialist appointment within 3-5 days of discharge Complete  ?[place] or Home Care Consult Complete  ?SW Recovery Care/Counseling Consult Complete  ?Palliative Care Screening Not Applicable  ?[place] Patient Refused  ?Some recent data might be hidden  ? ? ?

## 2021-04-26 LAB — OSMOLALITY, URINE: Osmolality, Ur: 363 mosm/kg (ref 300–900)

## 2021-04-26 LAB — MAGNESIUM: Magnesium: 1.3 mg/dL — ABNORMAL LOW (ref 1.7–2.4)

## 2021-04-26 LAB — BASIC METABOLIC PANEL
Anion gap: 9 (ref 5–15)
BUN: <5 mg/dL — ABNORMAL LOW (ref 8–23)
CO2: 22 mmol/L (ref 22–32)
Calcium: 8.6 mg/dL — ABNORMAL LOW (ref 8.9–10.3)
Chloride: 95 mmol/L — ABNORMAL LOW (ref 98–111)
Creatinine, Ser: 0.45 mg/dL (ref 0.44–1.00)
GFR, Estimated: >60 mL/min (ref >60)
Glucose, Bld: 92 mg/dL (ref 70–99)
Potassium: 3.6 mmol/L (ref 3.5–5.1)
Sodium: 126 mmol/L — ABNORMAL LOW (ref 135–145)

## 2021-04-26 LAB — SODIUM, URINE, RANDOM: Sodium, Ur: 151 mmol/L

## 2021-04-26 MED ORDER — POTASSIUM CHLORIDE CRYS ER 20 MEQ PO TBCR
40.0000 meq | EXTENDED_RELEASE_TABLET | Freq: Once | ORAL | Status: AC
Start: 2021-04-26 — End: 2021-04-26
  Administered 2021-04-26: 40 meq via ORAL
  Filled 2021-04-26: qty 2

## 2021-04-26 MED ORDER — MAGNESIUM SULFATE 4 GM/100ML IV SOLN
4.0000 g | Freq: Once | INTRAVENOUS | Status: AC
  Administered 2021-04-26: 4 g via INTRAVENOUS
  Filled 2021-04-26: qty 100

## 2021-04-26 MED ORDER — LOSARTAN POTASSIUM 50 MG PO TABS
100.0000 mg | ORAL_TABLET | Freq: Every day | ORAL | Status: DC
Start: 2021-04-26 — End: 2021-04-28
  Administered 2021-04-26 – 2021-04-27 (×2): 100 mg via ORAL
  Filled 2021-04-26 (×2): qty 2

## 2021-04-26 NOTE — TOC Progression Note (Addendum)
Transition of Care (TOC) - Progression Note  ? ? ?Patient Details  ?Name: Mindy Young ?MRN: [id number] ?Date of Birth: Sep 12, 1945 ? ?Transition of Care (TOC) CM/SW Contact  ?[place], LCSW ?Phone Number: ?04/26/2021, 10:26 AM ? ?Clinical Narrative:    ? ?CSW called pt's daughter Mindy Young for SNF choice. Daughter states she wants either [facility] or [facility]; she has no preference, just whichever has a bed available. CSW contacted [facility]; they can accept pt today if Josem Kaufmann is received. CSW submitted insurance auth request in [place]; likely will need new PT/OT note to be included in auth request. CSW notified PT signed into pt.  ? ?1055: [facility] called requesting therapy documentation from last 48 hours. CSW explains he will submit when available.  ? ?8295: CSW uploaded new PT note to [place] ? ?Expected Discharge Plan: [place] ?Barriers to Discharge: Insurance Authorization ? ?Expected Discharge Plan and Services ?Expected Discharge Plan: [place] ?In-house Referral: Clinical Social Work ?Discharge Planning Services: CM Consult ?  ?Living arrangements for the past 2 months: [place] ?                ?  ?  ?  ?  ?  ?  ?  ?  ?  ?  ? ? ?Social Determinants of Health (SDOH) Interventions ?  ? ?Readmission Risk Interventions ?Readmission Risk Prevention Plan 02/23/2021  ?Transportation Screening Complete  ?Medication Review Press photographer) Complete  ?PCP or Specialist appointment within 3-5 days of discharge Complete  ?Day or Home Care Consult Complete  ?SW Recovery Care/Counseling Consult Complete  ?Palliative Care Screening Not Applicable  ?[place] Patient Refused  ?Some recent data might be hidden  ? ? ?

## 2021-04-26 NOTE — Plan of Care (Signed)
  Problem: Coping: Goal: Level of anxiety will decrease Outcome: Progressing   Problem: Safety: Goal: Ability to remain free from injury will improve Outcome: Progressing   Problem: Skin Integrity: Goal: Risk for impaired skin integrity will decrease Outcome: Progressing   

## 2021-04-26 NOTE — Progress Notes (Signed)
Speech Language Pathology Treatment: Cognitive-Linquistic  ?Patient Details ?Name: Mindy Young ?MRN: [id number] ?DOB: [birth year] ?Today's Date: 04/26/2021 ?Time: 3295-1884 ?SLP Time Calculation (min) (ACUTE ONLY): 18 min ? ?Assessment / Plan / Recommendation ?Clinical Impression ? Pt was seen for cognitive-linguistic treatment. She demonstrated improved awareness compared to when she was last seen and she stated, "I believe I've gotten a better handle on everything that's going on." She completed a medication management (i.e., prescription) task with 50% accuracy increasing to 100% with cues for reasoning and attention. She demonstrated 100% with problem solving related to safety. She completed a sequencing task with a single cue for details. She achieved 50% accuracy with a mental manipulation task increasing to 100% with cues for attention and repetition. SLP will continue to follow pt.   ?  ?HPI HPI: Pt is a 76 y.o. female who presented to the ED with acute onset of recurrent falls. CT head 2/23: Acute subdural hemorrhage layering along the left  tentorium, measuring up to 3 mm in thickness. PMH: alcohol abuse, anxiety, depression, GERD, seizure disorder and previous SDH, hypertension, tardive dyskinesia and breast cancer status post lumpectomy. Speech-language evaluation 12/12/18: short term recall, thought organization, mental calculations, and higher levels of cognition including reasoning, problem solving and executive functions. ?  ?   ?SLP Plan ? Continue with current plan of care ? ?  ?  ?Recommendations for follow up therapy are one component of a multi-disciplinary discharge planning process, led by the attending physician.  Recommendations may be updated based on patient status, additional functional criteria and insurance authorization. ?  ? ?Recommendations  ?   ?   ?    ?   ? ? ? ? Follow Up Recommendations: Skilled nursing-short term rehab (<3 hours/day) ?Assistance recommended at discharge: Frequent  or constant Supervision/Assistance ?SLP Visit Diagnosis: Cognitive communication deficit (R41.841) ?Plan: Continue with current plan of care ? ? ? ? ?  ?  ? ?Celes Dedic I. Hardin Negus, [place], CCC-SLP ?Acute Rehabilitation Services ?Office number [phone] ?Pager [phone] ? ?Horton Marshall ? ?04/26/2021, 11:22 AM ? ? ? ?

## 2021-04-26 NOTE — Progress Notes (Signed)
PROGRESS NOTE    Mindy Young  XBD:[id number] DOB: 02/02/1946 DOA: 04/15/2021 PCP: Loura Pardon, MD    Brief Narrative:  Mindy Young is a 76 y.o. Caucasian female with medical history significant for alcohol abuse, anxiety, depression, GERD, seizure disorder and previous SDH, hypertension, tardive dyskinesia and breast cancer status post lumpectomy, who presented to the emergency room with acute onset of recurrent falls.   CT Head revealed acute subdural hemorrhage layering along the left tentorial leaflet that is 3 mm thick and along the right cerebral convexity that is 2 mm thick with no significant mass effect.  Maxillofacial CT which was motion limited without evidence of fracture. C-spine CT showed no evidence for acute fracture or traumatic malalignment.  It showed severe multilevel degenerative change and emphysema.  Repeat CT head showed nnlarging low-attenuation subdural hematoma along the left cerebral convexity resulting in increasing mass effect upon the left cerebral hemisphere and developing 4 mm left-to-right midline shift, stable 3 mm subdural hematoma along the left tentorium. On call physician Dr Cyd Silence discussed with neurosurgery and suggested that these findings are chronic. Recommended outpatient follow up with Dr Reatha Armour in 2 weeks.  Hospital course also complicated by confusion and alcohol withdrawals.  Seen by PT and OT recommended SNF placement.  Patient currently not able to make her own medical decisions per psychiatry and given previous PCP conversations that her next fall could be fatal she is not safe for discharge home.      Assessment & Plan:   Assessment and Plan: * Subdural hematoma Patient presenting to ED with confusion, following fall and was found to have acute and chronic subdural hematomas likely complicated by alcohol intoxication.  Was evaluated by neurosurgery with repeat CT head and no further intervention recommended at this time; with outpatient  follow-up with neurosurgery, Dr. Reatha Armour in 2 weeks.    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 PCP called and said he is worried of her well being and currently is not safe for discharge home.  Was evaluated by psychiatry on 04/20/2021, currently does not have capacity for medical decision making.  Seen by SLP for cognitive evaluation with score on SLUMS 12/30 --Continue to encourage alcohol cessation        Seizure ([place]) No seizure episodes since admission, per pharmacy patient is not taking phenobarbital.  --Keppra 1000 mg BID   Hyponatremia Etiology likely secondary to EtOH use disorder.  Not on a thiazide diuretic outpatient sodium 120 on admission. --Na 120>>127>130>128>126>123>128>126 --Serum osmolality 264 --Sodium chloride 1 g p.o. twice daily --Continue to encourage EtOH cessation --Continue intermittent monitoring of sodium level   Alcohol abuse EtOH level elevated 62 on admission. --Multivitamin, thiamine, folic acid --Continue encourage alcohol cessation  Hypertension --Losartan 100 mg p.o. daily --Metoprolol succinate 50 mg p.o. daily  Anemia Macrocytic,. Hemoglobin stable around 10.  --Continue vitamin b12 supplementation.   Hypomagnesemia Magnesium level 1.3 this am.   --Increase magnesium oxide to '800mg'$  PO BID. --Magnesium 4 g IV x1 today --Repeat electrolytes including magnesium in a.m.  COPD (chronic obstructive pulmonary disease) (HCC) Stable, no acute exacerbation. --Albuterol neb as needed  Face lacerations Patient with facial laceration to right side.  Seen by ENT, Dr. Janace Hoard; and given that this is likely an old facial laceration with eschar and exudate already formed in the base, recommend wound care, cleaning with hydroperoxide once a day and saline gauze to the wound  twice a day.  Recommended antibiotic course for possible  underlying infection and once wound heals can consider revision at that time.  Completed course of antibiotics with Keflex. --Continue wound care  Encounter for assessment of decision-making capacity Evaluated by psychiatry on 2/28 with findings that patient does not possess capacity for medical decision-making at this time.  Was seen by speech therapy on 3/3 with SLUMS 12/30 consistent with significant cognitive impairment.  Etiology likely secondary to her prolonged abuse of alcohol.  Patient is unable to appropriately care for herself, unable to manage her chronic comorbidities and perform ADLs alone.  ETOH abuse Patient continues excessive use of hard liquor outpatient with [name].  Counseled on several occasions for need for complete cessation.     DVT prophylaxis: SCDs Start: 04/15/21 1935    Code Status: Full Code Family Communication: No family present at bedside this morning  Disposition Plan:  Level of care: Med-Surg Status is: Inpatient Remains inpatient appropriate because: Pending SNF placement     Consultants:  Neurosurgery, Dr. Reatha Armour Psychiatry ENT, Dr. Janace Hoard  Procedures:  None  Antimicrobials:  Keflex 2/28>> Ancef 2/24 - 2/28    Subjective: Patient seen examined bedside, resting comfortably.  Continues to complain of intermittent headaches.  No other specific complaints, concerns or questions at this time.  Patient continues to have a lack of insight regarding her chronic medical conditions, continued alcohol abuse and overall functional decline.  No family present at bedside.  Awaiting SNF placement, medically stable for discharge once bed available. Denies vision changes, no chest pain, no palpitations, no shortness of breath, no abdominal pain, no weakness, no fatigue, no paresthesias.  No acute events overnight per nursing staff.  Continues to lack capacity for medical decision-making.  Objective: Vitals:   04/25/21 2326 04/25/21 2328 04/26/21  0351 04/26/21 0750  BP: (!) 170/89 (!) 163/85 (!) 167/83 (!) 153/75  Pulse: 72 72 65 68  Resp: '16  16 14  '$ Temp: 97.8 F (36.6 C)  97.9 F (36.6 C) 98.3 F (36.8 C)  TempSrc: Oral  Oral Oral  SpO2: 96% 99% 95% 97%  Weight:      Height:        Intake/Output Summary (Last 24 hours) at 04/26/2021 1138 Last data filed at 04/26/2021 9518 Gross per 24 hour  Intake 777 ml  Output 800 ml  Net -23 ml   Filed Weights   04/15/21 1451  Weight: 61.2 kg    Examination:  Physical Exam: GEN: NAD, alert and oriented x 3, chronically ill in appearance, appears older than stated age with multiple areas of ecchymosis, skin tears in various stages of healing HEENT:  PERRL, EOMI, sclera clear, MMM, noted right facial wound without erythema/fluctuance PULM: CTAB w/o wheezes/crackles, normal respiratory effort, on room air CV: RRR w/o M/G/R GI: abd soft, NTND, NABS, no R/G/M MSK: no peripheral edema, muscle strength globally intact 5/5 bilateral upper/lower extremities NEURO: CN II-XII intact, no focal deficits, sensation to light touch intact PSYCH: Depressed mood, flat affect Integumentary: Multiple skin tears to extremities in various stages of healing, multiple areas of ecchymosis to extremities, noted right facial wound without erythema/fluctuance         Data Reviewed: I have personally reviewed following labs and imaging studies  CBC: No results for input(s): WBC, NEUTROABS, HGB, HCT, MCV, PLT in the last 168 hours.  Basic Metabolic Panel: Recent Labs  Lab 04/20/21 8416 04/21/21 0123 04/22/21 0125 04/23/21 6063 04/24/21 0160 04/25/21 1093 04/26/21 2355  NA 127*   < > 128* 126* 123* 128* 126*  K 3.7   < > 4.4 4.2 3.9 3.9 3.6  CL 91*   < > 94* 93* 93* 98 95*  CO2 26   < > '24 24 23 22 22  '$ GLUCOSE 131*   < > 101* 96 108* 90 92  BUN 6*   < > 5* <5* <5* <5* <5*  CREATININE 0.63   < > 0.55 0.47 0.64 0.54 0.45  CALCIUM 8.6*   < > 8.8* 8.7* 8.6* 8.5* 8.6*  MG 1.4*   < > 1.3*  1.6* 2.0 1.6* 1.3*  PHOS 4.3  --   --   --   --   --   --    < > = values in this interval not displayed.   GFR: Estimated Creatinine Clearance: 52.3 mL/min (by C-G formula based on SCr of 0.45 mg/dL). Liver Function Tests: Recent Labs  Lab 04/21/21 0123  AST 25  ALT 15  ALKPHOS 78  BILITOT 0.2*  PROT 5.0*  ALBUMIN 2.6*   No results for input(s): LIPASE, AMYLASE in the last 168 hours. No results for input(s): AMMONIA in the last 168 hours. Coagulation Profile: No results for input(s): INR, PROTIME in the last 168 hours. Cardiac Enzymes: No results for input(s): CKTOTAL, CKMB, CKMBINDEX, TROPONINI in the last 168 hours. BNP (last 3 results) No results for input(s): PROBNP in the last 8760 hours. HbA1C: No results for input(s): HGBA1C in the last 72 hours. CBG: No results for input(s): GLUCAP in the last 168 hours.  Lipid Profile: No results for input(s): CHOL, HDL, LDLCALC, TRIG, CHOLHDL, LDLDIRECT in the last 72 hours. Thyroid Function Tests: No results for input(s): TSH, T4TOTAL, FREET4, T3FREE, THYROIDAB in the last 72 hours. Anemia Panel: No results for input(s): VITAMINB12, FOLATE, FERRITIN, TIBC, IRON, RETICCTPCT in the last 72 hours. Sepsis Labs: No results for input(s): PROCALCITON, LATICACIDVEN in the last 168 hours.  No results found for this or any previous visit (from the past 240 hour(s)).        Radiology Studies: No results found.      Scheduled Meds:  folic acid  1 mg Oral Daily   Gerhardt's butt cream   Topical BID   levETIRAcetam  1,000 mg Oral BID   losartan  100 mg Oral Daily   magnesium oxide  800 mg Oral BID   melatonin  10 mg Oral QHS   metoprolol succinate  50 mg Oral Daily   multivitamin with minerals  1 tablet Oral Daily   neomycin-bacitracin-polymyxin   Topical BID   oxybutynin  10 mg Oral q morning   QUEtiapine  12.5 mg Oral QHS   sodium chloride flush  10-40 mL Intracatheter Q12H   sodium chloride  1 g Oral BID WC    thiamine  100 mg Oral Daily   vitamin B-12  3,000 mcg Oral q morning   Continuous Infusions:  magnesium sulfate bolus IVPB 4 g (04/26/21 0958)       LOS: 11 days    Time spent: 44 minutes spent on chart review, discussion with nursing staff, consultants, updating family and interview/physical exam; more than 50% of that time was spent in counseling and/or coordination of care.    Ezella Kell J [place] DO [facility] Available via Epic secure chat 7am-7pm After these hours, please refer to coverage provider listed on [email or web address] 04/26/2021, 11:38 AM

## 2021-04-26 NOTE — Progress Notes (Signed)
Patient ID: Mindy Young, female   DOB: Jul 20, 1945, 76 y.o.   MRN: [id number] ? ? ? ?Progress Note from the Palliative Medicine Team at [facility] ? ? ?Patient Name: Mindy Young        ?Date: 04/26/2021 ?DOB: Jul 17, 1945  Age: 76 y.o. MRN#: [id number] ?Attending Physician: [place] Eric J, DO ?Primary Care Physician: Loura Pardon, MD ?Admit Date: 04/15/2021 ? ? ?Medical records reviewed  ? ?76 y.o. female  admitted on 04/15/2021 with medical history significant for alcohol abuse, anxiety, depression, GERD, seizure disorder and previous SDH, hypertension, tardive dyskinesia and breast cancer status post lumpectomy, who presented to the emergency room with acute onset of recurrent falls.   ?  ?She had a fall 3 days ago with subsequent right facial wound    ?  ?When she came to the ER, vital signs were within normal.  Labs revealed significant hyponatremia 120 and hypochloremia of 80 compared to 128 and 90 on 2/15, with a calcium of 8 and albumin of 3, total protein of 5.1 and magnesium level of 1 with a potassium of 3.6.  CBC showed anemia with hemoglobin of 10.1 and hematocrit 29.7 close to previous levels with macrocytosis.  Her blood glucose was 60 and alcohol abuse 62. ?  ?Imaging: Head CT scan revealed acute subdural hemorrhage layering along the left tentorial leaflet that is 3 mm thick and along the right cerebral convexity that is 2 mm thick with no significant mass effect.  It showed right posterior scalp contusion without acute fracture and chronic microvascular disease as well as cerebral atrophy. ? ?Maxillofacial CT which was motion limited without evidence of fracture. ? ?C-spine CT showed no evidence for acute fracture or traumatic malalignment.  It showed severe multilevel degenerative change and emphysema. ?  ?Admitted for treatment stabilization.  Today is day 11 of this hospitalization.  Patient remains without capacity.   ?Plan is skilled nursing facility for short-term rehab. ? ?This NP visited  patient at the bedside as a follow up for palliative medicine needs and emotional support.   ?Patient is more alert today however she does not have medical decision-making capacity at the current time.  Speech is garbled, she has poor insight into her situation care giver/Laura Louretta Parma is at the bedside, along with patient's dog "[place]"  ? ?I spoke to her cousin Erskine Squibb by telephone again today.    She expresses ongoing concerns regarding alcohol use, and the likelihood that patient will sign herself out a facility and return to previous living conditions. ? ?Education offered on the difference between capacity versus competency as it relates to guardianship.  Education offered on the initial steps to seeking guardianship. ? ?Recommend outpatient palliative care services at facility. ? ?PMT will continue to support holistically ? ?Total time spent on the unit was 50 minutes  ? ?Greater than 50% of the time was spent in counseling and coordination of care ? ?Wadie Lessen NP  ?Palliative Medicine Team Team Phone # [phone] ?Pager [phone] ?  ?

## 2021-04-26 NOTE — Progress Notes (Signed)
Physical Therapy Treatment ?Patient Details ?Name: Mindy Young ?MRN: [id number] ?DOB: 1945-09-10 ?Today's Date: 04/26/2021 ? ? ?History of Present Illness 76 yo female presented to ED with head trauma resulting from a fall while intoxicated. Pt was found to have Hyponatremia.  Pt has history of falls, at least 3 in the past 2 weeks where she presented to the ED according to he chart. PMHx:  alcohol abuse, anxiety, depression, GERD, seizure disorder and previous SDH, hypertension, tardive dyskinesia, and breast cancer status post lumpectomy, UTI, ? ?  ?PT Comments  ? ? Pt continues to need assist with all mobility due to weakness, decr activity tolerance, decr balance, and poor cognition. Pt making slow, steady progress. Needs encouragement to incr activity. Continue to recommend SNF at dc prior to return home.    ?Recommendations for follow up therapy are one component of a multi-disciplinary discharge planning process, led by the attending physician.  Recommendations may be updated based on patient status, additional functional criteria and insurance authorization. ? ?Follow Up Recommendations ? Skilled nursing-short term rehab (<3 hours/day) ?  ?  ?Assistance Recommended at Discharge Frequent or constant Supervision/Assistance  ?Patient can return home with the following A little help with walking and/or transfers;A little help with bathing/dressing/bathroom;Assistance with cooking/housework;Direct supervision/assist for medications management;Direct supervision/assist for financial management;Assist for transportation;Help with stairs or ramp for entrance ?  ?Equipment Recommendations ? Rolling walker (2 wheels)  ?  ?Recommendations for Other Services   ? ? ?  ?Precautions / Restrictions Precautions ?Precautions: Fall ?Restrictions ?Weight Bearing Restrictions: No  ?  ? ?Mobility ? Bed Mobility ?Overal bed mobility: Needs Assistance ?Bed Mobility: Supine to Sit, Sit to Supine ?  ?  ?Supine to sit: Supervision ?Sit  to supine: Supervision ?  ?General bed mobility comments: Supervision for safety ?  ? ?Transfers ?Overall transfer level: Needs assistance ?Equipment used: Rolling walker (2 wheels) ?Transfers: Sit to/from Stand ?Sit to Stand: Min guard ?  ?  ?  ?  ?  ?General transfer comment: Assist for safety as pt needs repeated verbal/tactile cues for hand placement ?  ? ?Ambulation/Gait ?Ambulation/Gait assistance: Min assist ?Gait Distance (Feet): 80 Feet ?Assistive device: Rolling walker (2 wheels) ?Gait Pattern/deviations: Step-through pattern, Decreased stride length, Trunk flexed, Drifts right/left ?Gait velocity: decr ?Gait velocity interpretation: <1.8 ft/sec, indicate of risk for recurrent falls ?  ?General Gait Details: Assist for balance and walker management ? ? ?Stairs ?  ?  ?  ?  ?  ? ? ?Wheelchair Mobility ?  ? ?Modified Rankin (Stroke Patients Only) ?  ? ? ?  ?Balance Overall balance assessment: Needs assistance ?Sitting-balance support: No upper extremity supported, Feet supported ?Sitting balance-Leahy Scale: Good ?  ?  ?Standing balance support: No upper extremity supported, During functional activity ?Standing balance-Leahy Scale: Fair ?  ?  ?  ?  ?  ?  ?  ?  ?  ?  ?  ?  ?  ? ?  ?Cognition Arousal/Alertness: Awake/alert ?Behavior During Therapy: Impulsive ?Overall Cognitive Status: Impaired/Different from baseline ?Area of Impairment: Orientation, Attention, Following commands, Safety/judgement, Awareness, Problem solving, Memory ?  ?  ?  ?  ?  ?  ?  ?  ?Orientation Level: Disoriented to, Time (2024, the rest was correct) ?Current Attention Level: Sustained ?Memory: Decreased short-term memory ?Following Commands: Follows one step commands with increased time ?Safety/Judgement: Decreased awareness of safety, Decreased awareness of deficits ?Awareness: Emergent ?Problem Solving: Requires verbal cues, Requires tactile cues, Difficulty sequencing ?  ?  ?  ? ?  ?  Exercises   ? ?  ?General Comments General  comments (skin integrity, edema, etc.): VSS on RA. Pt reports lightheaded but BP 150's/90's ?  ?  ? ?Pertinent Vitals/Pain Pain Assessment ?Pain Location: headache ?Pain Descriptors / Indicators: Aching, Headache  ? ? ?Home Living   ?  ?  ?  ?  ?  ?  ?  ?  ?  ?   ?  ?Prior Function    ?  ?  ?   ? ?PT Goals (current goals can now be found in the care plan section) Progress towards PT goals: Progressing toward goals ? ?  ?Frequency ? ? ? Min 3X/week ? ? ? ?  ?PT Plan Current plan remains appropriate  ? ? ?Co-evaluation   ?  ?  ?  ?  ? ?  ?AM-PAC PT "6 Clicks" Mobility   ?Outcome Measure ? Help needed turning from your back to your side while in a flat bed without using bedrails?: A Little ?Help needed moving from lying on your back to sitting on the side of a flat bed without using bedrails?: A Little ?Help needed moving to and from a bed to a chair (including a wheelchair)?: A Little ?Help needed standing up from a chair using your arms (e.g., wheelchair or bedside chair)?: A Little ?Help needed to walk in hospital room?: A Little ?Help needed climbing 3-5 steps with a railing? : A Lot ?6 Click Score: 17 ? ?  ?End of Session Equipment Utilized During Treatment: Gait belt ?Activity Tolerance: Patient limited by fatigue ?Patient left: in chair;with call bell/phone within reach;with bed alarm set;with nursing/sitter in room ?Nurse Communication: Mobility status ?PT Visit Diagnosis: Unsteadiness on feet (R26.81);Other symptoms and signs involving the nervous system (R29.898);Other abnormalities of gait and mobility (R26.89) ?  ? ? ?Time: 6599-3570 ?PT Time Calculation (min) (ACUTE ONLY): 27 min ? ?Charges:  $Gait Training: 23-37 mins          ?          ? ?[facility] PT ?Acute Rehabilitation Services ?Pager [phone] ?Office [phone] ? ? ? ?Shary Decamp [facility] ?04/26/2021, 12:37 PM ? ?

## 2021-04-27 LAB — BASIC METABOLIC PANEL
Anion gap: 9 (ref 5–15)
BUN: 5 mg/dL — ABNORMAL LOW (ref 8–23)
CO2: 22 mmol/L (ref 22–32)
Calcium: 9 mg/dL (ref 8.9–10.3)
Chloride: 96 mmol/L — ABNORMAL LOW (ref 98–111)
Creatinine, Ser: 0.5 mg/dL (ref 0.44–1.00)
GFR, Estimated: >60 mL/min (ref >60)
Glucose, Bld: 93 mg/dL (ref 70–99)
Potassium: 4.2 mmol/L (ref 3.5–5.1)
Sodium: 127 mmol/L — ABNORMAL LOW (ref 135–145)

## 2021-04-27 LAB — MAGNESIUM: Magnesium: 1.5 mg/dL — ABNORMAL LOW (ref 1.7–2.4)

## 2021-04-27 LAB — PHOSPHORUS: Phosphorus: 4.2 mg/dL (ref 2.5–4.6)

## 2021-04-27 MED ORDER — MAGNESIUM SULFATE 4 GM/100ML IV SOLN
4.0000 g | Freq: Once | INTRAVENOUS | Status: AC
  Administered 2021-04-27: 4 g via INTRAVENOUS
  Filled 2021-04-27: qty 100

## 2021-04-27 MED ORDER — MAGNESIUM OXIDE -MG SUPPLEMENT 400 (240 MG) MG PO TABS: 800.0000 mg | ORAL_TABLET | Freq: Two times a day (BID) | ORAL | Status: AC

## 2021-04-27 MED ORDER — QUETIAPINE FUMARATE 25 MG PO TABS: 12.5000 mg | ORAL_TABLET | Freq: Every day | ORAL | Status: DC

## 2021-04-27 MED ORDER — SODIUM CHLORIDE 1 G PO TABS: 1.0000 g | ORAL_TABLET | Freq: Two times a day (BID) | ORAL | Status: DC

## 2021-04-27 MED ORDER — LOSARTAN POTASSIUM 100 MG PO TABS: 100.0000 mg | ORAL_TABLET | Freq: Every day | ORAL | Status: AC

## 2021-04-27 NOTE — Discharge Summary (Signed)
Physician Discharge Summary  Mindy Young OAC:[id number] DOB: 06-15-1945 DOA: 04/15/2021  PCP: Loura Pardon, MD  Admit date: 04/15/2021 Discharge date: 04/27/2021  Admitted From: Home Disposition: [facility] SNF  Recommendations for Outpatient Follow-up:  Follow up with PCP in 1-2 weeks Follow-up with neurosurgery, Dr. Reatha Armour in 2 weeks Outpatient follow-up with neurology in 4 weeks Continue to encourage alcohol cessation Per psychiatry, patient currently does not have capacity for medical decision making Please obtain BMP and magnesium in one week to assess sodium level  Discharge Condition: Stable CODE STATUS: Full code Diet recommendation: Regular diet  History of present illness:  Mindy Young is a 76 y.o. Caucasian female with medical history significant for alcohol abuse, anxiety, depression, GERD, seizure disorder and previous SDH, hypertension, tardive dyskinesia and breast cancer status post lumpectomy, who presented to the emergency room with acute onset of recurrent falls.   CT Head revealed acute subdural hemorrhage layering along the left tentorial leaflet that is 3 mm thick and along the right cerebral convexity that is 2 mm thick with no significant mass effect.  Maxillofacial CT which was motion limited without evidence of fracture. C-spine CT showed no evidence for acute fracture or traumatic malalignment.  It showed severe multilevel degenerative change and emphysema.  Repeat CT head showed nnlarging low-attenuation subdural hematoma along the left cerebral convexity resulting in increasing mass effect upon the left cerebral hemisphere and developing 4 mm left-to-right midline shift, stable 3 mm subdural hematoma along the left tentorium. On call physician Dr Cyd Silence discussed with neurosurgery and suggested that these findings are chronic. Recommended outpatient follow up with Dr Reatha Armour in 2 weeks.  Hospital course also complicated by confusion and alcohol  withdrawals.  Seen by PT and OT recommended SNF placement.  Patient currently not able to make her own medical decisions per psychiatry and given previous PCP conversations that her next fall could be fatal she is not safe for discharge home.    Hospital course:  Assessment and Plan: * Subdural hematoma Patient presenting to ED with confusion, following fall and was found to have acute and chronic subdural hematomas likely complicated by alcohol intoxication.  Was evaluated by neurosurgery with repeat CT head and no further intervention recommended at this time; with outpatient follow-up with neurosurgery, Dr. Reatha Armour in 2 weeks.    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 PCP called and said he is worried of her well being and currently is not safe for discharge home.  Was evaluated by psychiatry on 04/20/2021, currently does not have capacity for medical decision making.  Seen by SLP for cognitive evaluation with score on SLUMS 12/30. Continue to encourage alcohol cessation        Seizure ([place]) No seizure episodes since admission, per pharmacy patient is not taking phenobarbital.  Continue Keppra 1000 mg BID.  Outpatient follow-up with neurology 4 weeks.   Hyponatremia Etiology likely secondary to EtOH use disorder.  Not on a thiazide diuretic outpatient sodium 120 on admission.  Continue sodium chloride 1 g p.o. twice daily.  Continue encourage alcohol cessation.  Sodium 127 at time of discharge.  Recommend repeat BMP 1 week.   Alcohol abuse EtOH level elevated 62 on admission. Multivitamin, thiamine, folic acid. Continue encourage alcohol cessation  Hypertension Losartan 100 mg p.o. daily, Metoprolol succinate 50 mg p.o. daily  Anemia Macrocytic,. Hemoglobin stable around 10. Continue vitamin b12 supplementation.   Hypomagnesemia Magnesium  oxide 800 mg p.o. twice  daily.  COPD (chronic obstructive pulmonary disease) (HCC) Stable, no acute exacerbation.  Advair/albuterol as needed  Face lacerations Patient with facial laceration to right side.  Seen by ENT, Dr. Janace Hoard; and given that this is likely an old facial laceration with eschar and exudate already formed in the base, recommend wound care, cleaning with hydroperoxide once a day and saline gauze to the wound twice a day.  Recommended antibiotic course for possible underlying infection and once wound heals can consider revision at that time.  Completed course of antibiotics with Keflex. Continue wound care  Encounter for assessment of decision-making capacity Evaluated by psychiatry on 2/28 with findings that patient does not possess capacity for medical decision-making at this time.  Was seen by speech therapy on 3/3 with SLUMS 12/30 consistent with significant cognitive impairment.  Etiology likely secondary to her prolonged abuse of alcohol.  Patient is unable to appropriately care for herself, unable to manage her chronic comorbidities and perform ADLs alone.  ETOH abuse Patient continues excessive use of hard liquor outpatient with [name].  Counseled on several occasions for need for complete cessation.       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 Secure with Kerlix wrap. Change Xeroform daily.   Increase activity slowly   Complete by: As directed       Allergies as of 04/27/2021       Reactions   Percocet [oxycodone-acetaminophen] Other (See Comments)   "bugs crawling on me"        Medication List     STOP taking these medications    acamprosate 333 MG tablet Commonly known as: CAMPRAL   doxycycline 100 MG capsule Commonly known as: MONODOX   lisinopril 5 MG tablet Commonly known as: ZESTRIL   mirtazapine 15 MG tablet Commonly known as: REMERON   PHENObarbital 30 MG tablet Commonly known as: LUMINAL   PHENobarbital 32.4 MG tablet Commonly known as: LUMINAL       TAKE these medications    albuterol 108 (90 Base) MCG/ACT inhaler Commonly known as: VENTOLIN HFA Inhale 1-2 puffs into the lungs every 4 (four) hours as needed for wheezing or shortness of breath.   Cyanocobalamin 3000 MCG Caps Take 3,000 mcg by mouth every evening.   diphenoxylate-atropine 2.5-0.025 MG tablet Commonly known as: Lomotil Take 1 tablet by mouth 4 (four) times daily as needed for diarrhea or loose stools.   fluticasone-salmeterol 250-50 MCG/ACT Aepb Commonly known as: ADVAIR Inhale 1 puff into the lungs 2 (two) times daily as needed (shortness of breath/wheezing).   folic acid 1 MG tablet Commonly known as: FOLVITE Take 1 tablet (1 mg total) by mouth daily.   gabapentin  100 MG capsule Commonly known as: NEURONTIN Take 100 mg by mouth 3 (three) times daily as needed for pain.   levETIRAcetam 1000 MG tablet Commonly known as: KEPPRA Take 1 tablet (1,000 mg total) by mouth 2 (two) times daily.   losartan 100 MG tablet Commonly known as: COZAAR Take 1 tablet (100 mg total) by mouth daily. Start taking on: April 28, 2021 What changed:  medication strength how much to take   magnesium oxide 400 (240 Mg) MG tablet Commonly known as: MAG-OX Take 2  tablets (800 mg total) by mouth 2 (two) times daily.   Melatonin 10 MG Tabs Take 10 mg by mouth at bedtime.   metoprolol succinate 50 MG 24 hr tablet Commonly known as: TOPROL-XL Take 1 tablet (50 mg total) by mouth daily. Take with or immediately following a meal. What changed: additional instructions   mupirocin ointment 2 % Commonly known as: BACTROBAN Apply 1 application topically daily.   ondansetron 4 MG tablet Commonly known as: ZOFRAN Take 4 mg by mouth every 8 (eight) hours as needed for nausea or vomiting.   oxybutynin 10 MG 24 hr tablet Commonly known as: DITROPAN-XL Take 10 mg by mouth every morning.   QUEtiapine 25 MG tablet Commonly known as: SEROQUEL Take 0.5 tablets (12.5 mg total) by mouth at bedtime.   sodium chloride 1 g tablet Take 1 tablet (1 g total) by mouth 2 (two) times daily with a meal.   thiamine 100 MG tablet Take 1 tablet (100 mg total) by mouth daily.               Discharge Care Instructions  (From admission, onward)           Start     Ordered   04/27/21 0000  Discharge wound care:       Comments: Clean the legs and arms with soap and water, gently pat dry then apply a cut to fit piece of Xeroform gauze over all abrasions/skin tears on the arms and legs. Secure with Kerlix wrap. Change Xeroform daily.   04/27/21 0915            Follow-up Information     Paliwal, Himanshu, MD. Schedule an appointment as soon as possible for a visit in 1 week(s).   Specialty: Family Medicine Contact information: [place] [address] [phone]         Sherren Mocha, MD .   Specialty: Cardiology Contact information: [phone] [address] [place] [address] [phone]         Dawley, Theodoro Doing, DO. Schedule an appointment as soon as possible for a visit in 2 week(s).   Contact information: [address] [place] [address] [place] [address] [phone]                Allergies   Allergen Reactions   Percocet [Oxycodone-Acetaminophen] Other (See Comments)    "bugs crawling on me"    Consultations: Neurosurgery, Dr. Reatha Armour Psychiatry ENT, Dr. Janace Hoard   Procedures/Studies: DG Forearm Left  Result Date: 03/30/2021 CLINICAL DATA:  Left forearm pain.  Fall. EXAM: LEFT FOREARM - 2 VIEW COMPARISON:  Elbow radiographs 12/19/2020 FINDINGS: Soft tissue swelling present along the dorsal aspect of the forearm. No underlying fracture or foreign body is present. Degenerative changes are noted at the wrist. Wrist and elbow joints are otherwise unremarkable. IMPRESSION: Soft tissue swelling along the dorsal aspect of the forearm without underlying fracture or foreign body. Electronically Signed  By: San Morelle M.D.   On: 03/30/2021 12:48   DG Wrist 2 Views Left  Result Date: 04/18/2021 CLINICAL DATA:  Trauma, pain EXAM: LEFT WRIST - 2 VIEW COMPARISON:  None. FINDINGS: No recent fracture or dislocation is seen. Degenerative changes are noted in the radiocarpal joint. IMPRESSION: No recent fracture or dislocation is seen. Degenerative changes are noted. Electronically Signed   By: Elmer Picker M.D.   On: 04/18/2021 17:07   DG Wrist 2 Views Right  Result Date: 04/18/2021 CLINICAL DATA:  Trauma, pain EXAM: RIGHT WRIST - 2 VIEW COMPARISON:  None. FINDINGS: No recent fracture or dislocation is seen. Degenerative changes are noted in the first metacarpophalangeal joint. There are faint calcifications in the soft tissues adjacent to distal ulna, possibly cartilage or ligament calcification. IMPRESSION: No recent fracture or dislocation is seen in the right wrist. Degenerative changes are noted in right first metacarpophalangeal joint. Electronically Signed   By: Elmer Picker M.D.   On: 04/18/2021 17:08   DG Knee 1-2 Views Left  Result Date: 04/18/2021 CLINICAL DATA:  Trauma, pain EXAM: LEFT KNEE - 1-2 VIEW COMPARISON:  03/30/2021 FINDINGS: No recent fracture or  dislocation is seen. There is no significant effusion. Degenerative changes are noted with joint space narrowing and bony spurs in the medial compartment. Bony spurs are noted in the lateral and patellofemoral compartments along with meniscal cartilage calcifications. IMPRESSION: No recent fracture is seen. Degenerative changes are noted, more severe in the medial compartment. Electronically Signed   By: Elmer Picker M.D.   On: 04/18/2021 17:10   DG Knee 1-2 Views Right  Result Date: 04/18/2021 CLINICAL DATA:  Trauma, pain EXAM: RIGHT KNEE - 1-2 VIEW COMPARISON:  None. FINDINGS: No recent fracture or dislocation is seen. Degenerative changes are noted with bony spurs and calcifications in the meniscal cartilages. There is no significant effusion. IMPRESSION: No fracture or dislocation is seen in the right knee. Degenerative changes are noted with bony spurs. Electronically Signed   By: Elmer Picker M.D.   On: 04/18/2021 17:09   DG Tibia/Fibula Left  Result Date: 03/30/2021 CLINICAL DATA:  Fall.  Left leg pain. EXAM: LEFT TIBIA AND FIBULA - 2 VIEW COMPARISON:  None. FINDINGS: Soft tissue laceration noted along the anterior and lateral aspect of the lower extremity. No underlying fracture or foreign body is present. The knee and ankle joints are intact. IMPRESSION: Soft tissue laceration without underlying fracture or foreign body. Electronically Signed   By: San Morelle M.D.   On: 03/30/2021 12:47   CT HEAD WO CONTRAST (5MM)  Result Date: 04/17/2021 CLINICAL DATA:  Subdural hematoma EXAM: CT HEAD WITHOUT CONTRAST TECHNIQUE: Contiguous axial images were obtained from the base of the skull through the vertex without intravenous contrast. RADIATION DOSE REDUCTION: This exam was performed according to the departmental dose-optimization program which includes automated exposure control, adjustment of the mA and/or kV according to patient size and/or use of iterative reconstruction  technique. COMPARISON:  04/15/2021 FINDINGS: Brain: Left low attenuation subdural hematoma along the left cerebral convexity demonstrates interval increase in size now measuring up to 8 mm in thickness and demonstrates increasing mild mass effect upon the left cerebral hemisphere. There is development of a 4 mm left-to-right midline shift. Hyperdense 3 mm subdural hematoma layering along the left tentorium is unchanged. Moderate subcortical and periventricular white matter changes are again identified in keeping with changes of small vessel ischemia. Mild parenchymal volume loss is commensurate with the patient's age. Remote lacunar  infarct noted within the right basal ganglia and right cerebellar hemisphere. Ventricular size is normal. No abnormal intra or extra-axial mass lesion. Cerebellum is otherwise unremarkable. Vascular: No hyperdense vessel or unexpected calcification. Skull: Normal. Negative for fracture or focal lesion. Sinuses/Orbits: Ocular lenses have been removed. Orbits are otherwise unremarkable. There is opacification of the left sphenoid sinus. Mild mucosal thickening within the right maxillary sinus. Remaining paranasal sinuses are clear. Other: Mastoid air cells and middle ear cavities are clear. Soft tissue swelling involving the a parieto-occipital scalp bilaterally is unchanged. IMPRESSION: Enlarging low-attenuation subdural hematoma along the left cerebral convexity resulting in increasing mass effect upon the left cerebral hemisphere and developing 4 mm left-to-right midline shift. Stable 3 mm subdural hematoma along the left tentorium Previously identified right frontotemporal subdural hematoma is not well appreciated on this examination Mild paranasal sinus disease. These results will be called to the ordering clinician or representative by the Radiologist Assistant, and communication documented in the PACS or [employer]. Electronically Signed   By: Fidela Salisbury M.D.   On:  04/17/2021 19:34   CT Head Wo Contrast  Result Date: 04/15/2021 CLINICAL DATA:  Follow-up subdural hematoma. EXAM: CT HEAD WITHOUT CONTRAST TECHNIQUE: Contiguous axial images were obtained from the base of the skull through the vertex without intravenous contrast. RADIATION DOSE REDUCTION: This exam was performed according to the departmental dose-optimization program which includes automated exposure control, adjustment of the mA and/or kV according to patient size and/or use of iterative reconstruction technique. COMPARISON:  5 hours prior FINDINGS: Brain: Unchanged acute subdural hemorrhage layering along the left tentorium, measuring up to 3 mm in thickness, series 5, image 43. There is slightly decreasing density suggesting some interval dispersion. This thin, 2 mm, subdural hemorrhage along the right frontotemporal convexity is not significantly changed, for example series 5, image 31. there may be a thin, 2-3 mm a isodense subdural collection overlying the left frontoparietal convexity, series 5, image 44 and series 3, image 21. There is no significant mass effect or midline shift from any of these subdural hemorrhages. Stable degree of atrophy and chronic small vessel ischemia. No acute infarct. Vascular: Skull base atherosclerosis.  No hyperdense vessel Skull: No skull fracture. Sinuses/Orbits: Assessed earlier on face CT. Other: Right parietal scalp hematoma. IMPRESSION: 1. Unchanged acute subdural hemorrhage layering along the left tentorium, measuring up to 3 mm in thickness. There is slightly decreasing density suggesting some interval dispersion. 2. Unchanged thin, 2 mm, subdural hemorrhage along the right frontotemporal convexity. 3. Possible thin, 2-3 mm isodense subdural collection overlying the left frontoparietal convexity, not seen previously. 4. No significant mass effect or midline shift from any of these subdural hemorrhages. 5. Stable atrophy and chronic small vessel ischemia.  Electronically Signed   By: Keith Rake M.D.   On: 04/15/2021 21:10   CT Head Wo Contrast  Result Date: 04/15/2021 CLINICAL DATA:  Head trauma, minor (Age >= 65y); Neck trauma (Age >= 65y); Facial trauma, blunt EXAM: CT HEAD WITHOUT CONTRAST CT MAXILLOFACIAL WITHOUT CONTRAST CT CERVICAL SPINE WITHOUT CONTRAST TECHNIQUE: Multidetector CT imaging of the head, cervical spine, and maxillofacial structures were performed using the standard protocol without intravenous contrast. Multiplanar CT image reconstructions of the cervical spine and maxillofacial structures were also generated. RADIATION DOSE REDUCTION: This exam was performed according to the departmental dose-optimization program which includes automated exposure control, adjustment of the mA and/or kV according to patient size and/or use of iterative reconstruction technique. COMPARISON:  April 15, 2021 FINDINGS: CT HEAD  FINDINGS Brain: Acute subdural hemorrhage layering along the left tentorial leaflet, measuring up to 3 mm in thickness. Thin (2 mm thick) acute subdural hemorrhage along the right frontotemporal convexity. No evidence of acute large vascular territory infarct. Moderate patchy and confluent white matter hypoattenuation, nonspecific but compatible with chronic microvascular ischemic disease. No midline shift, mass lesion, or hydrocephalus. Cerebral atrophy with ex vacuo ventricular dilation. Vascular: Calcific atherosclerosis. Skull: Right posterior scalp contusion without acute fracture. Other: No mastoid effusions. CT MAXILLOFACIAL FINDINGS Mildly motion limited. Osseous: No fracture or mandibular dislocation. No destructive process. Orbits: Negative. No traumatic or inflammatory finding. Sinuses: Mucosal thickening of the inferior right maxillary sinus. Fluid layering in a small left sphenoid sinus. Otherwise, clear sinuses. Soft tissues: Negative. CT CERVICAL SPINE FINDINGS Alignment: Similar alignment, including mild  anterolisthesis of C3 on C4, C4 on C5 and C7 on T1. broad levocurvature. Skull base and vertebrae: No evidence of acute fracture. Stable bony fusion across the C4-C5 disc space with similar anterior height loss and focal kyphosis at this level. No new vertebral body height loss. Soft tissues and spinal canal: No prevertebral fluid or swelling. No visible canal hematoma. Disc levels: Stable severe degenerative disc disease at C5-C6 and C6-C7. Similar severe multilevel facet arthropathy and severe craniocervical degenerative change. Upper chest: Emphysema with similar scarring in the lung apices. 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 IMPRESSION: CT head: 1. Acute subdural hemorrhage layering along the left tentorial leaflet (3 mm thick) and along the right cerebral convexity (2 mm thick). No significant mass effect. 2. Right posterior scalp contusion without acute fracture. 3. 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 CT maxillofacial: Motion limited without evidence of acute fracture. CT cervical spine: 1. No evidence of acute fracture or traumatic malalignment. 2. Similar severe multilevel degenerative change. 3.  Emphysema (ICD10-J43.9). Findings discussed with provider Norval Gable, [place] via telephone at 4:52 PM. Electronically Signed   By: Margaretha Sheffield M.D.   On: 04/15/2021 16:54   CT Head Wo Contrast  Result Date: 04/07/2021 CLINICAL DATA:  Intoxicated, lacerations and bruising EXAM: CT HEAD WITHOUT CONTRAST TECHNIQUE: Contiguous axial images were obtained from the base of the skull through the vertex without intravenous contrast. RADIATION DOSE REDUCTION: This exam was performed according to the departmental dose-optimization program which includes automated exposure control, adjustment of the mA and/or kV according to patient size and/or use of iterative reconstruction technique. COMPARISON:  03/30/2021 FINDINGS: Brain: Hypodensities throughout the periventricular white  matter, basal ganglia, and thalami consistent with chronic small vessel ischemic changes. No acute infarct or hemorrhage. Lateral ventricles and remaining midline structures are unremarkable. No acute extra-axial fluid collections. No mass effect. Vascular: No hyperdense vessel or unexpected calcification. Skull: Scalp edema involving the left frontal and left occipital regions. Laceration along the left frontal convexity. Negative for fracture or focal lesion. Sinuses/Orbits: No acute finding. Other: None. IMPRESSION: 1. Scalp edema in the left frontal and left occipital regions. 2. No acute intracranial process. 3. Stable chronic small vessel ischemic changes. Electronically Signed   By: Randa Ngo M.D.   On: 04/07/2021 22:52   CT Head Wo Contrast  Result Date: 03/30/2021 CLINICAL DATA:  Head trauma, minor (Age >= 65y) EXAM: CT HEAD WITHOUT CONTRAST TECHNIQUE: Contiguous axial images were obtained from the base of the skull through the vertex without intravenous contrast. RADIATION DOSE REDUCTION: This exam was performed according to the departmental dose-optimization program which includes automated exposure control, adjustment of the mA and/or kV according to patient size  and/or use of iterative reconstruction technique. COMPARISON:  December 26, 2020. FINDINGS: Brain: No evidence of acute large vascular territory infarction, hemorrhage, hydrocephalus, extra-axial collection or mass lesion/mass effect. Similar patchy white matter hypoattenuation, nonspecific but compatible chronic microvascular ischemic disease. Remote lacunar infarct in the right basal ganglia. Similar cerebral atrophy with ex vacuo ventricular dilation. Vascular: Calcific intracranial atherosclerosis. No hyperdense vessel identified. Skull: Left posterior scalp contusion.  No acute fracture. Sinuses/Orbits: Left sphenoid sinus fluid, chronic. Otherwise, visualized sinuses are clear. Unremarkable orbits. Other: No mastoid effusions.  IMPRESSION: 1. No evidence of acute intracranial abnormality. 2. Left posterior scalp contusion without acute fracture. 3. Remote right basal ganglia lacunar infarct, chronic microvascular ischemic disease, and cerebral atrophy (ICD10-G31.9). Electronically Signed   By: Margaretha Sheffield M.D.   On: 03/30/2021 14:08   CT Cervical Spine Wo Contrast  Result Date: 04/15/2021 CLINICAL DATA:  Head trauma, minor (Age >= 65y); Neck trauma (Age >= 65y); Facial trauma, blunt EXAM: CT HEAD WITHOUT CONTRAST CT MAXILLOFACIAL WITHOUT CONTRAST CT CERVICAL SPINE WITHOUT CONTRAST TECHNIQUE: Multidetector CT imaging of the head, cervical spine, and maxillofacial structures were performed using the standard protocol without intravenous contrast. Multiplanar CT image reconstructions of the cervical spine and maxillofacial structures were also generated. RADIATION DOSE REDUCTION: This exam was performed according to the departmental dose-optimization program which includes automated exposure control, adjustment of the mA and/or kV according to patient size and/or use of iterative reconstruction technique. COMPARISON:  April 15, 2021 FINDINGS: CT HEAD FINDINGS Brain: Acute subdural hemorrhage layering along the left tentorial leaflet, measuring up to 3 mm in thickness. Thin (2 mm thick) acute subdural hemorrhage along the right frontotemporal convexity. No evidence of acute large vascular territory infarct. Moderate patchy and confluent white matter hypoattenuation, nonspecific but compatible with chronic microvascular ischemic disease. No midline shift, mass lesion, or hydrocephalus. Cerebral atrophy with ex vacuo ventricular dilation. Vascular: Calcific atherosclerosis. Skull: Right posterior scalp contusion without acute fracture. Other: No mastoid effusions. CT MAXILLOFACIAL FINDINGS Mildly motion limited. Osseous: No fracture or mandibular dislocation. No destructive process. Orbits: Negative. No traumatic or inflammatory  finding. Sinuses: Mucosal thickening of the inferior right maxillary sinus. Fluid layering in a small left sphenoid sinus. Otherwise, clear sinuses. Soft tissues: Negative. CT CERVICAL SPINE FINDINGS Alignment: Similar alignment, including mild anterolisthesis of C3 on C4, C4 on C5 and C7 on T1. broad levocurvature. Skull base and vertebrae: No evidence of acute fracture. Stable bony fusion across the C4-C5 disc space with similar anterior height loss and focal kyphosis at this level. No new vertebral body height loss. Soft tissues and spinal canal: No prevertebral fluid or swelling. No visible canal hematoma. Disc levels: Stable severe degenerative disc disease at C5-C6 and C6-C7. Similar severe multilevel facet arthropathy and severe craniocervical degenerative change. Upper chest: Emphysema with similar scarring in the lung apices. 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 IMPRESSION: CT head: 1. Acute subdural hemorrhage layering along the left tentorial leaflet (3 mm thick) and along the right cerebral convexity (2 mm thick). No significant mass effect. 2. Right posterior scalp contusion without acute fracture. 3. 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 CT maxillofacial: Motion limited without evidence of acute fracture. CT cervical spine: 1. No evidence of acute fracture or traumatic malalignment. 2. Similar severe multilevel degenerative change. 3.  Emphysema (ICD10-J43.9). Findings discussed with provider Norval Gable, [place] via telephone at 4:52 PM. Electronically Signed   By: Margaretha Sheffield M.D.   On: 04/15/2021 16:54   CT Cervical Spine Wo  Contrast  Result Date: 04/07/2021 CLINICAL DATA:  Intoxicated, multiple lacerations and bruising EXAM: CT CERVICAL SPINE WITHOUT CONTRAST TECHNIQUE: Multidetector CT imaging of the cervical spine was performed without intravenous contrast. Multiplanar CT image reconstructions were also generated. RADIATION DOSE REDUCTION: This exam was  performed according to the departmental dose-optimization program which includes automated exposure control, adjustment of the mA and/or kV according to patient size and/or use of iterative reconstruction technique. COMPARISON:  03/30/2021 FINDINGS: Alignment: Stable mild anterolisthesis of C4 on C5 and C7 on T1. Skull base and vertebrae: No acute fracture. No primary bone lesion or focal pathologic process. Soft tissues and spinal canal: No prevertebral fluid or swelling. No visible canal hematoma. Disc levels: Stable bony fusion across the C4-5 disc space. Marked spondylosis at C5-6 and C6-7. There is diffuse facet hypertrophy unchanged. Marked hypertrophic changes again noted at the C1-C2 interface. Upper chest: Airway is patent. Stable emphysematous changes and scarring at the lung apices. Chronic nonunion left clavicular fracture. Other: Reconstructed images demonstrate no additional findings. IMPRESSION: 1. Stable multilevel spondylosis and facet hypertrophy. No acute cervical spine fracture. Electronically Signed   By: Randa Ngo M.D.   On: 04/07/2021 22:49   CT Cervical Spine Wo Contrast  Result Date: 03/30/2021 CLINICAL DATA:  Trauma, fall EXAM: CT CERVICAL SPINE WITHOUT CONTRAST TECHNIQUE: Multidetector CT imaging of the cervical spine was performed without intravenous contrast. Multiplanar CT image reconstructions were also generated. RADIATION DOSE REDUCTION: This exam was performed according to the departmental dose-optimization program which includes automated exposure control, adjustment of the mA and/or kV according to patient size and/or use of iterative reconstruction technique. COMPARISON:  12/19/2020 FINDINGS: Alignment: There is mild anterolisthesis at C4-C5 level and minimal anterolisthesis at C7-T1 level. Skull base and vertebrae: No recent fracture is seen. There is partial fusion of bodies of C4 and C5 vertebrae. Degenerative changes are noted in cervical spine. Soft tissues and  spinal canal: There is extrinsic pressure over the ventral margin of thecal sac caused by posterior bony spurs at C5-C6 level with spinal stenosis. Disc levels: There is encroachment of neural foramina by bony spurs and facet hypertrophy from C2 to C7 levels. This finding is particularly severe on the right side at C3-C4 level and on both sides at C5-C6 and C6-C7 levels. Upper chest: Linear densities in the right apex may suggest scarring. Centrilobular and panlobular emphysema is seen in the upper lung fields. Other: No significant interval changes are noted. IMPRESSION: No recent fracture is seen. Cervical spondylosis with encroachment of neural foramina from C2-C7 levels as described in the body of the report. Electronically Signed   By: Elmer Picker M.D.   On: 03/30/2021 13:02   CT CHEST ABDOMEN PELVIS W CONTRAST  Result Date: 04/15/2021 CLINICAL DATA:  Trauma, fall EXAM: CT CHEST, ABDOMEN, AND PELVIS WITH CONTRAST TECHNIQUE: Multidetector CT imaging of the chest, abdomen and pelvis was performed following the standard protocol during bolus administration of intravenous contrast. RADIATION DOSE REDUCTION: This exam was performed according to the departmental dose-optimization program which includes automated exposure control, adjustment of the mA and/or kV according to patient size and/or use of iterative reconstruction technique. CONTRAST:  15m OMNIPAQUE IOHEXOL 300 MG/ML  SOLN COMPARISON:  04/07/2021 FINDINGS: CT CHEST FINDINGS Cardiovascular: There is homogeneous enhancement in thoracic aorta. There are no filling defects in central pulmonary artery branches. Mediastinum/Nodes: There is no evidence of mediastinal hematoma. There is no significant lymphadenopathy. Lungs/Pleura: Severe centrilobular and panlobular emphysema is seen. There are patchy infiltrates in  the right middle lobe and right lower lobe with interval worsening of infiltrate in the right lower lobe. Small right pleural effusion  is seen. Linear densities seen in both apices, more so on the right side with no significant interval change. There is no pneumothorax. Musculoskeletal: No displaced fractures are seen. CT ABDOMEN PELVIS FINDINGS Hepatobiliary: Liver measures 14.7 cm. There is fatty infiltration. There are few low-density lesions in the liver largest in the left lobe measuring approximately 1.4 cm. Gallbladder is not seen. There is no significant dilation of bile ducts. Pancreas: No focal abnormality is seen. Spleen: Unremarkable. Adrenals/Urinary Tract: Adrenals are unremarkable. There is no demonstrable cortical laceration. There is no hydronephrosis. Ureters are not dilated. Urinary bladder is unremarkable. Stomach/Bowel: Stomach is unremarkable. Small bowel loops are not dilated. Appendix is not distinctly seen. There is no pericecal inflammation. There is no significant wall thickening in colon. There is no pericolic stranding. Vascular/Lymphatic: There are scattered atherosclerotic plaques and calcifications in the aorta and its major branches. Reproductive: Uterus is not seen. Other: There is no ascites or pneumoperitoneum. Small umbilical hernia containing fat is seen. Possible small right inguinal hernia containing fat is seen. Augmentation/reconstruction prostheses are seen in both breasts. Musculoskeletal: No displaced fractures are seen. Degenerative changes are noted in the lower lumbar spine with encroachment of neural foramina. There is first-degree spondylolisthesis and spinal stenosis at L4-L5 level. IMPRESSION: There is no evidence of mediastinal or retroperitoneal hematoma. There are linear patchy infiltrates in the right middle lobe and right lower lobe with interval worsening in the right lower lobe suggesting atelectasis/pneumonia. There is interval appearance of small right pleural effusion. There is no demonstrable laceration in solid organs. There is no ascites or pneumoperitoneum. Fatty liver. There are  low-density lesions in the liver suggesting cysts or hemangiomas or some other space-occupying neoplastic process. Severe centrilobular and panlobular emphysema. Lumbar spondylosis with spinal stenosis at L4-L5 level. Other findings as described in the body of the report. Electronically Signed   By: Elmer Picker M.D.   On: 04/15/2021 16:50   CT CHEST ABDOMEN PELVIS W CONTRAST  Result Date: 04/07/2021 CLINICAL DATA:  Fall, ETOH EXAM: CT CHEST, ABDOMEN, AND PELVIS WITH CONTRAST TECHNIQUE: Multidetector CT imaging of the chest, abdomen and pelvis was performed following the standard protocol during bolus administration of intravenous contrast. RADIATION DOSE REDUCTION: This exam was performed according to the departmental dose-optimization program which includes automated exposure control, adjustment of the mA and/or kV according to patient size and/or use of iterative reconstruction technique. CONTRAST:  141m OMNIPAQUE IOHEXOL 300 MG/ML  SOLN COMPARISON:  CTA chest dated 02/19/2021. CT chest abdomen pelvis dated 11/06/2020. FINDINGS: CT CHEST FINDINGS Cardiovascular: No evidence of traumatic aortic injury. Atherosclerotic calcifications of the arch. The heart is normal in size.  No pericardial effusion. Mediastinum/Nodes: No evidence of intra mediastinal hematoma. No suspicious mediastinal lymphadenopathy. Visualized thyroid is unremarkable. Lungs/Pleura: Moderate centrilobular and paraseptal emphysematous changes, upper lung predominant. Biapical pleural-parenchymal scarring. No suspicious pulmonary nodules. Right middle lobe atelectasis/collapse, chronic. No focal consolidation. No pleural effusion or pneumothorax. Musculoskeletal: Bilateral breast augmentation. Old left clavicle fracture deformity. Mild superior endplate compression fracture deformity at T5, chronic. No acute fracture is seen. Mild to moderate degenerative changes of the mid/lower thoracic spine. CT ABDOMEN PELVIS FINDINGS  Hepatobiliary: Dominant 16 mm lesion in the left hepatic dome (series 3/image 83), unchanged. Additional scattered subcentimeter hepatic lesions. Focal fat/altered perfusion along the falciform ligament. Status post cholecystectomy. No intrahepatic ductal dilatation, improved. No extrahepatic ductal  dilatation, improved. Common duct measures 8 mm. Pancreas: Within normal limits. Spleen: Within normal limits. Adrenals/Urinary Tract: Adrenal glands are within normal limits. Kidneys are within normal limits, noting excretory contrast in the bilateral renal collecting systems. No hydronephrosis. Bladder is within normal limits. Stomach/Bowel: Stomach is within normal limits. No evidence of bowel obstruction. Appendix is not discretely visualized. No colonic wall thickening or inflammatory changes. Vascular/Lymphatic: No evidence of abdominal aortic aneurysm. Atherosclerotic calcifications of the abdominal aorta and branch vessels. No suspicious abdominopelvic lymphadenopathy. Reproductive: Status post hysterectomy. No adnexal masses. Other: No abdominopelvic ascites. No hemoperitoneum or free air. Musculoskeletal: Old right superior and inferior pubic rami fractures. Mild degenerative changes of the lower lumbar spine. No acute fracture is seen. IMPRESSION: No evidence of acute traumatic injury to the chest, abdomen, or pelvis. Additional stable ancillary findings as above. Electronically Signed   By: Julian Hy M.D.   On: 04/07/2021 22:52   DG Chest Port 1 View  Result Date: 04/07/2021 CLINICAL DATA:  Trauma. EXAM: PORTABLE CHEST 1 VIEW COMPARISON:  Chest radiograph dated 02/19/2021 and CT dated 02/19/2021. FINDINGS: No focal consolidation, pleural effusion, pneumothorax. The cardiac silhouette is within limits. Atherosclerotic calcification of the aortic arch. No acute osseous pathology. Degenerative changes of the spine. Bilateral breast implants. IMPRESSION: No active cardiopulmonary disease.  Electronically Signed   By: Anner Crete M.D.   On: 04/07/2021 19:31   DG Humerus Left  Result Date: 04/15/2021 CLINICAL DATA:  Fall EXAM: LEFT HUMERUS - 2+ VIEW COMPARISON:  None. FINDINGS: There is no evidence of fracture or other focal bone lesions. Soft tissues are unremarkable. IMPRESSION: Negative. Electronically Signed   By: Franchot Gallo M.D.   On: 04/15/2021 15:44   DG Humerus Right  Result Date: 04/15/2021 CLINICAL DATA:  Fall EXAM: RIGHT HUMERUS - 2+ VIEW COMPARISON:  None. FINDINGS: There is no evidence of fracture or other focal bone lesions. Soft tissues are unremarkable. IMPRESSION: Negative. Electronically Signed   By: Franchot Gallo M.D.   On: 04/15/2021 15:43   MM 3D SCREEN BREAST W/IMPLANT BILATERAL  Result Date: 04/13/2021 CLINICAL DATA:  Screening. EXAM: DIGITAL SCREENING BILATERAL MAMMOGRAM WITH IMPLANTS, CAD AND TOMOSYNTHESIS TECHNIQUE: Bilateral screening digital craniocaudal and mediolateral oblique mammograms were obtained. Bilateral screening digital breast tomosynthesis was performed. The images were evaluated with computer-aided detection. Standard and/or implant displaced views were performed. COMPARISON:  Previous exam(s). ACR Breast Density Category b: There are scattered areas of fibroglandular density. FINDINGS: The patient has retropectoral implants. There are no findings suspicious for malignancy. IMPRESSION: No mammographic evidence of malignancy. A result letter of this screening mammogram will be mailed directly to the patient. RECOMMENDATION: Screening mammogram in one year. (Code:SM-B-01Y) BI-RADS CATEGORY  1:  Negative. Electronically Signed   By: Kristopher Oppenheim M.D.   On: 04/13/2021 14:18   CT US GUIDE VASC ACCESS LT NO REPORT  Result Date: 04/15/2021 There is no Radiologist interpretation  for this exam.  CT Maxillofacial WO CM  Result Date: 04/15/2021 CLINICAL DATA:  Head trauma, minor (Age >= 65y); Neck trauma (Age >= 65y); Facial trauma, blunt  EXAM: CT HEAD WITHOUT CONTRAST CT MAXILLOFACIAL WITHOUT CONTRAST CT CERVICAL SPINE WITHOUT CONTRAST TECHNIQUE: Multidetector CT imaging of the head, cervical spine, and maxillofacial structures were performed using the standard protocol without intravenous contrast. Multiplanar CT image reconstructions of the cervical spine and maxillofacial structures were also generated. RADIATION DOSE REDUCTION: This exam was performed according to the departmental dose-optimization program which includes automated exposure control, adjustment of  the mA and/or kV according to patient size and/or use of iterative reconstruction technique. COMPARISON:  April 15, 2021 FINDINGS: CT HEAD FINDINGS Brain: Acute subdural hemorrhage layering along the left tentorial leaflet, measuring up to 3 mm in thickness. Thin (2 mm thick) acute subdural hemorrhage along the right frontotemporal convexity. No evidence of acute large vascular territory infarct. Moderate patchy and confluent white matter hypoattenuation, nonspecific but compatible with chronic microvascular ischemic disease. No midline shift, mass lesion, or hydrocephalus. Cerebral atrophy with ex vacuo ventricular dilation. Vascular: Calcific atherosclerosis. Skull: Right posterior scalp contusion without acute fracture. Other: No mastoid effusions. CT MAXILLOFACIAL FINDINGS Mildly motion limited. Osseous: No fracture or mandibular dislocation. No destructive process. Orbits: Negative. No traumatic or inflammatory finding. Sinuses: Mucosal thickening of the inferior right maxillary sinus. Fluid layering in a small left sphenoid sinus. Otherwise, clear sinuses. Soft tissues: Negative. CT CERVICAL SPINE FINDINGS Alignment: Similar alignment, including mild anterolisthesis of C3 on C4, C4 on C5 and C7 on T1. broad levocurvature. Skull base and vertebrae: No evidence of acute fracture. Stable bony fusion across the C4-C5 disc space with similar anterior height loss and focal kyphosis at  this level. No new vertebral body height loss. Soft tissues and spinal canal: No prevertebral fluid or swelling. No visible canal hematoma. Disc levels: Stable severe degenerative disc disease at C5-C6 and C6-C7. Similar severe multilevel facet arthropathy and severe craniocervical degenerative change. Upper chest: Emphysema with similar scarring in the lung apices. 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 IMPRESSION: CT head: 1. Acute subdural hemorrhage layering along the left tentorial leaflet (3 mm thick) and along the right cerebral convexity (2 mm thick). No significant mass effect. 2. Right posterior scalp contusion without acute fracture. 3. 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 CT maxillofacial: Motion limited without evidence of acute fracture. CT cervical spine: 1. No evidence of acute fracture or traumatic malalignment. 2. Similar severe multilevel degenerative change. 3.  Emphysema (ICD10-J43.9). Findings discussed with provider Norval Gable, [place] via telephone at 4:52 PM. Electronically Signed   By: Margaretha Sheffield M.D.   On: 04/15/2021 16:54     Subjective:   Discharge Exam: Vitals:   04/26/21 2346 04/27/21 0800  BP: 133/87 138/72  Pulse: 71 71  Resp: 17 18  Temp:  98 F (36.7 C)  SpO2: 94% 99%   Vitals:   04/26/21 1602 04/26/21 2002 04/26/21 2346 04/27/21 0800  BP: (!) 168/83 (!) 144/83 133/87 138/72  Pulse: 66 60 71 71  Resp: '18 16 17 18  '$ Temp: 98.1 F (36.7 C) 98.7 F (37.1 C) (!) 97.5 F (36.4 C) 98 F (36.7 C)  TempSrc: Oral Oral Oral   SpO2: 98% 96% 94% 99%  Weight:      Height:        Physical Exam: GEN: NAD, alert and oriented x 3, chronically ill appearance, appears older than stated age, multiple areas of ecchymosis, skin tears in various stages of healing HEENT: NCAT, PERRL, EOMI, sclera clear, MMM, noted right facial wound without erythema/fluctuance healing by second intention PULM: CTAB w/o wheezes/crackles, normal  respiratory effort, on room air CV: RRR w/o M/G/R GI: abd soft, NTND, NABS, no R/G/M MSK: no peripheral edema, muscle strength globally intact 5/5 bilateral upper/lower extremities NEURO: CN II-XII intact, no focal deficits, sensation to light touch intact PSYCH: Depressed mood, flat affect, lacks insight to chronic medical conditions Integumentary: Multiple skin tears to extremities in various stages of healing, multiple areas of ecchymosis in various stages of healing,  right facial wound without erythema/fluctuance    The results of significant diagnostics from this hospitalization (including imaging, microbiology, ancillary and laboratory) are listed below for reference.     Microbiology: No results found for this or any previous visit (from the past 240 hour(s)).   Labs: BNP (last 3 results) No results for input(s): BNP in the last 8760 hours. Basic Metabolic Panel: Recent Labs  Lab 04/23/21 0514 04/24/21 0048 04/25/21 0318 04/26/21 0409 04/27/21 0101  NA 126* 123* 128* 126* 127*  K 4.2 3.9 3.9 3.6 4.2  CL 93* 93* 98 95* 96*  CO2 '24 23 22 22 22  '$ GLUCOSE 96 108* 90 92 93  BUN <5* <5* <5* <5* 5*  CREATININE 0.47 0.64 0.54 0.45 0.50  CALCIUM 8.7* 8.6* 8.5* 8.6* 9.0  MG 1.6* 2.0 1.6* 1.3* 1.5*  PHOS  --   --   --   --  4.2   Liver Function Tests: Recent Labs  Lab 04/21/21 0123  AST 25  ALT 15  ALKPHOS 78  BILITOT 0.2*  PROT 5.0*  ALBUMIN 2.6*   No results for input(s): LIPASE, AMYLASE in the last 168 hours. No results for input(s): AMMONIA in the last 168 hours. CBC: No results for input(s): WBC, NEUTROABS, HGB, HCT, MCV, PLT in the last 168 hours. Cardiac Enzymes: No results for input(s): CKTOTAL, CKMB, CKMBINDEX, TROPONINI in the last 168 hours. BNP: Invalid input(s): POCBNP CBG: No results for input(s): GLUCAP in the last 168 hours. D-Dimer No results for input(s): DDIMER in the last 72 hours. Hgb A1c No results for input(s): HGBA1C in the last 72  hours. Lipid Profile No results for input(s): CHOL, HDL, LDLCALC, TRIG, CHOLHDL, LDLDIRECT in the last 72 hours. Thyroid function studies No results for input(s): TSH, T4TOTAL, T3FREE, THYROIDAB in the last 72 hours.  Invalid input(s): FREET3 Anemia work up No results for input(s): VITAMINB12, FOLATE, FERRITIN, TIBC, IRON, RETICCTPCT in the last 72 hours. Urinalysis    Component Value Date/Time   COLORURINE YELLOW 10/26/2020 1514   APPEARANCEUR HAZY (A) 10/26/2020 1514   LABSPEC 1.010 10/26/2020 1514   PHURINE 6.5 10/26/2020 1514   GLUCOSEU NEGATIVE 10/26/2020 1514   HGBUR NEGATIVE 10/26/2020 1514   BILIRUBINUR NEGATIVE 10/26/2020 1514   KETONESUR NEGATIVE 10/26/2020 1514   PROTEINUR NEGATIVE 10/26/2020 1514   UROBILINOGEN 0.2 06/25/2011 1923   NITRITE NEGATIVE 10/26/2020 1514   LEUKOCYTESUR SMALL (A) 10/26/2020 1514   Sepsis Labs Invalid input(s): PROCALCITONIN,  WBC,  LACTICIDVEN Microbiology No results found for this or any previous visit (from the past 240 hour(s)).   Time coordinating discharge: Over 30 minutes  SIGNED:   Donnamarie Poag [place] DO  [facility] 04/27/2021, 9:18 AM

## 2021-04-27 NOTE — TOC Transition Note (Signed)
Transition of Care (TOC) - CM/SW Discharge Note ? ? ?Patient Details  ?Name: Mindy Young ?MRN: [id number] ?Date of Birth: [birth year] ? ?Transition of Care (TOC) CM/SW Contact:  ?[place], LCSW ?Phone Number: ?04/27/2021, 12:44 PM ? ? ?Clinical Narrative:    ? ?Patient will DC to: [facility] ?Anticipated DC date: 04/27/21 ?Family notified: Daughter [name] ?Transport by: Corey Harold ? ? ?Per MD patient ready for DC to [facility]. RN, patient, patient's family, and facility notified of DC. Discharge Summary and FL2 sent to facility. RN to call report prior to discharge [phone] Room 108). DC packet on chart. Ambulance transport requested for patient.  ? ?CSW will sign off for now as social work intervention is no longer needed. Please consult [place] again if new needs arise.  ? ? ?Final next level of care: [place] ?Barriers to Discharge: No Barriers Identified ? ? ?Patient Goals and CMS Choice ?  ?  ?  ? ?Discharge Placement ?  ?           ?Patient chooses bed at:  [facility]) ?Patient to be transferred to facility by: [facility] ?Name of family member notified: Daughter [name] ?Patient and family notified of of transfer: 04/27/21 ? ?Discharge Plan and Services ?In-house Referral: Clinical Social Work ?Discharge Planning Services: CM Consult ?           ?  ?  ?  ?  ?  ?  ?  ?  ?  ?  ? ?Social Determinants of Health (SDOH) Interventions ?  ? ? ?Readmission Risk Interventions ?Readmission Risk Prevention Plan 02/23/2021  ?Transportation Screening Complete  ?Medication Review Press photographer) Complete  ?PCP or Specialist appointment within 3-5 days of discharge Complete  ?[place] or Home Care Consult Complete  ?SW Recovery Care/Counseling Consult Complete  ?Palliative Care Screening Not Applicable  ?[place] Patient Refused  ?Some recent data might be hidden  ? ? ? ? ? ?

## 2021-04-27 NOTE — Progress Notes (Signed)
Report called to facility

## 2021-04-27 NOTE — Plan of Care (Signed)
?  Problem: Clinical Measurements: Goal: Will remain free from infection Outcome: Progressing Goal: Respiratory complications will improve Outcome: Progressing   Problem: Activity: Goal: Risk for activity intolerance will decrease Outcome: Progressing   Problem: Coping: Goal: Level of anxiety will decrease Outcome: Progressing   

## 2021-05-04 ENCOUNTER — Encounter: Payer: Self-pay | Admitting: Physician Assistant

## 2021-05-04 ENCOUNTER — Ambulatory Visit: Payer: Medicare Other | Admitting: Physician Assistant

## 2021-05-04 DIAGNOSIS — Z029 Encounter for administrative examinations, unspecified: Secondary | ICD-10-CM

## 2021-05-04 NOTE — Progress Notes (Incomplete)
?  ? ?Assessment/Plan:  ? ?1.  Seizures ? History of seizures with an abnormal EEG, demonstrating left temporal seizure.  Ambulatory 48-hour EEG was normal.  Last sodium on 03/02/21  was 133.  She continue to drink heavy amounts. Keppra levels on 01/02/21 was 25.7 (nl) . Her most recent labs show Keppra to be 55.5, but patient wants to stay in same dose to prevent seizures.   ?Needs complete  alcohol cessation under medical supervision. ?Check Keppra levels to adjust accordingly ?For now continue Keppra 1000 mg twice daily, will do adjustment depending on the levels ?Inpatient EEG was offered, the patient wants to defer for now ?Follow-up in 2 months ? ? ? ?Subjective:  ? ?Had a traumatic subdural hemorrhage with unknown loss of consciousness, hyponatremia with sodium of 127 on discharge, alcohol level of 62 and hypomagnesemia on 04/15/2021 with hospitalization until 04/27/2021, and another presentation to the ER on 04/07/2021 for alcohol intoxication, and another fall with facial laceration, and another visit to the ER on 03/30/2021 after a fall with left lower extremity skin tear, left upper extremity, as well as alcoholic intoxication. 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  Was seen by speech therapy on 3/3 with [facility] 12/30 consistent with significant cognitive impairment.  Etiology likely secondary to her prolonged abuse of alcohol.  Patient is unable to appropriately care for herself, unable to manage her chronic comorbidities and perform ADLs alone. ?DC to [facility].  Skilled nursing facility ? ?Mindy Young was seen today in follow up for seizure.  My previous records as well as any outside  records available were reviewed prior to todays visit.  Pt with caregiver who supplements the history.  She was last seen at our office on 01/01/2021.  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  Patient is a 76 year old female with a history of alcohol abuse, seizure, noncompliance with medication and follow-up.  Since her last visit, the patient had  a hospital presentations for alcohol intoxication on 12/2020. She is on Keppra at 1000 mg twice daily, compliant with the medication. Routine EEG on June 14, 2020 was normal, and a 48-hour EEG was essentially unremarkable.  She denies any new seizures.  She continued to drink more than a half a bottle to a bottle of hard liquor, and no less than 1/5 but recently she had decreased intake to 1-2 shots a day.  She has been feeling very anxious today, and she knows that if she develops DT, she will present to the emergency department.  She has a new psychiatrist, and is planning to set an appointment today  o discuss adjustment of her antianxiety medications, to help her with any mood changes.  Of note, during her most recent presentation to the ER for diarrhea she needed management for hypokalemia, hyponatremia, hypomagnesemia, and her EKG showed prolonged QT interval which is going to be followed up as an outpatient by cardiology. ? ?  ? ? Prior notes re: seizure in 2021: ? ?She was in the hospital in October with seizure.  Patient was at the emergency room the day prior to admission.  She was there for pain in the hip following a fall  as well as constipation.  She was evaluated and sent home.  The home health care aide check on her around 11 am, and thought she was fine and around 1130, she had an episode where she could not bring out her words and she was staring into space.  She did not lose consciousness, but was not necessarily responding to questions.  This apparently happened several times.  She was transferred from the  [place] to [facility], and while in route she had an episode in the ambulance where she had left-sided gaze preference and was not responding with the eyes open.  She had weakness on the left side.  She was able to follow commands with the right side of the body.  She apparently also had a similar episode while in the emergency room.  Sodium was very low at 121.  She had an EEG.  She had multiple episodes during the EEG.  Episodes correlated with left temporal seizures.  She saw neurology/epilepsy at the hospital.  It was felt that these could have been provoked in the setting of hyponatremia.  However, because of the frequency of the seizures, AEDs were initiated.  She was started on Keppra, as well as pyridoxine.  It was noted that once the hyponatremia was corrected, the patient did not have any further seizures, it was possible that the patient could be weaned off her AED.  Patient was discharged on Keppra, 1000 mg twice per day.  She states that she is not taking as directed.  She is only taking q day for the last week or so. Compliance is better when caregivers around.  Caregivers have noted that mood/agitation better but she has recently cut out lots of medication, not just the Keppra.   She was told to follow-up here.  She actually had several visits which she canceled.  Pt states that she has had no alcohol since 09/2018.   ? ? ?CT Head revealed acute subdural hemorrhage layering along the left tentorial leaflet that is 3 mm thick and along the right cerebral convexity that is 2 mm thick with no significant mass effect.  Maxillofacial CT which was motion limited without evidence of fracture. C-spine CT showed no evidence for acute fracture or traumatic malalignment.  It showed severe multilevel degenerative change and emphysema.  Repeat CT head showed nnlarging low-attenuation subdural hematoma along the left cerebral convexity resulting in increasing mass effect upon the left cerebral  hemisphere and developing 4 mm left-to-right midline shift, stable 3 mm subdural hematoma along the left tentorium. On call physician Dr Cyd Silence discussed with neurosurgery and suggested that these findings are chronic. Recommended outpatient follow up with Dr Reatha Armour in 2 weeks.  Hospital course also complicated by confusion and alcohol withdrawals.  Seen by PT and OT recommended SNF placement.  Patient currently not able to make her own medical decisions per psychiatry and given previous PCP conversations that her next fall could be fatal she is not safe for discharge home. ? ? ?CURRENT MEDICATIONS:  ?Outpatient Encounter Medications as of 05/04/2021  ?Medication Sig  ? albuterol (VENTOLIN HFA) 108 (90 Base) MCG/ACT inhaler Inhale 1-2 puffs into the lungs every 4 (four) hours as needed for wheezing or shortness of breath.  ? Cyanocobalamin 3000 MCG CAPS Take 3,000 mcg by mouth every evening.  ? diphenoxylate-atropine (LOMOTIL) 2.5-0.025 MG tablet Take 1 tablet by mouth 4 (four) times daily as needed for diarrhea or loose stools.  ? fluticasone-salmeterol (ADVAIR) 250-50 MCG/ACT  AEPB Inhale 1 puff into the lungs 2 (two) times daily as needed (shortness of breath/wheezing).  ? folic acid (FOLVITE) 1 MG tablet Take 1 tablet (1 mg total) by mouth daily.  ? gabapentin (NEURONTIN) 100 MG capsule Take 100 mg by mouth 3 (three) times daily as needed for pain.  ? levETIRAcetam (KEPPRA) 1000 MG tablet Take 1 tablet (1,000 mg total) by mouth 2 (two) times daily.  ? losartan (COZAAR) 100 MG tablet Take 1 tablet (100 mg total) by mouth daily.  ? magnesium oxide (MAG-OX) 400 (240 Mg) MG tablet Take 2 tablets (800 mg total) by mouth 2 (two) times daily.  ? Melatonin 10 MG TABS Take 10 mg by mouth at bedtime.  ? metoprolol succinate (TOPROL-XL) 50 MG 24 hr tablet Take 1 tablet (50 mg total) by mouth daily. Take with or immediately following a meal. (Patient taking differently: Take 50 mg by mouth daily.)  ? mupirocin ointment  (BACTROBAN) 2 % Apply 1 application topically daily.  ? ondansetron (ZOFRAN) 4 MG tablet Take 4 mg by mouth every 8 (eight) hours as needed for nausea or vomiting.  ? oxybutynin (DITROPAN-XL) 10 MG 24 hr tablet

## 2021-05-05 ENCOUNTER — Encounter: Payer: Self-pay | Admitting: Neurology

## 2021-05-07 ENCOUNTER — Telehealth: Payer: Self-pay | Admitting: Neurology

## 2021-05-07 NOTE — Telephone Encounter (Signed)
Patient dismissed from [facility] by Wells Guiles Tat, DO, effective 05/05/21. Dismissal Letter sent out by 1st class mail. [facility]   ?

## 2021-06-01 ENCOUNTER — Other Ambulatory Visit: Payer: Medicare Other

## 2021-06-21 ENCOUNTER — Ambulatory Visit: Payer: Medicare Other | Admitting: Neurology

## 2021-06-29 NOTE — Progress Notes (Deleted)
Cardiology Office Note:    Date:  06/29/2021   ID:  Mindy Young, DOB 1945-09-14, MRN [id number]  PCP:  Loura Pardon, MD   [facility] Providers Cardiologist:  Sherren Mocha, MD { Click to update primary MD,subspecialty MD or APP then REFRESH:1}    Referring MD: Loura Pardon, MD   Chief Complaint: ***  History of Present Illness:    Mindy Young is a *** 76 y.o. female with a hx of tachycardia, HFmrEF, hypertension, COPD, tobacco use, anemia, tardive dyskinesia, hypokalemia, hypomagnesemia, hyponatremia, breast cancer, and alcohol abuse  Initially seen by Dr. Burt Knack January 2020 in the setting of sinus tachycardia at rest.  Echo revealed EF of 55 to 60%, impaired diastolic function.  Outpatient cardiac monitor showed sinus rhythm average heart rate 84 bpm, short runs of PSVT but no sustained arrhythmia, rare PVCs, no atrial fibrillation or flutter.  She was lost in follow-up.   She was hospitalized 12/30 to 02/23/2021 with hypokalemia/hypomagnesemia and hyponatremia in the setting of alcohol withdrawal and dehydration following 1 month history of diarrhea. Troponin elevated 433 ? 180.  EKG on admission showed sinus tachycardia with PACs, nonspecific lateral ST changes, but no acute ST elevation or depression, LAFB, and prolonged QTc (503).  Chest CTA negative for PE, did show aortic atherosclerosis, moderate to advanced emphysema, and collapsed right middle lobe in the setting of acute mucous plug for which she underwent bronchoscopy.  Echocardiogram revealed EF 40 to 45%, mildly decreased LV function, LV global hypokinesis, moderate LVH, and moderate TR.  She declined rehab for alcohol abuse as well as home health, advised to follow-up with cardiology as OP.  She returned to our office following this admission and was seen 03/02/2021 by Diona Browner, NP at which time she reported PCP had dc'ed her lisinopril due to low BP.  She was working on reducing smoking and drinking alcohol.  Has assistance of daily in-home caregiver.  She was advised to start metoprolol succinate 25 mg daily for tachycardia and return in 1 month for follow-up.  Admission 2/23-04/27/21 following a fall with acute and chronic subdural hematomas likely complicated by alcohol intoxication.  Evaluation by psychiatry on 04/20/2021 with findings that patient does not possess capacity for medical decision-making at this time.  She was transferred to SNF. Return admission 05/18/21 to [facility] with alcohol intoxication and withdrawal, refused inpatient rehab for alcohol abuse and was discharged to SNF.   Today, she is here   Past Medical History:  Diagnosis Date   Alcohol abuse    Allergy    Anemia 06/29/2011   Anxiety    Arthritis    Breast cancer ([place])    right breast   C1 cervical fracture ([place]) 02/1997   Cancer ([place])    melanoma   Depression    Family history of breast cancer    Family history of prostate cancer    GERD (gastroesophageal reflux disease)    Hypertension    Tardive dyskinesia     Past Surgical History:  Procedure Laterality Date   ABDOMINAL HYSTERECTOMY     ASPIRATION OF ABSCESS Right 11/20/2017   Procedure: ASPIRATION OF RIGHT AXILLARY SEROMA;  Surgeon: Rolm Bookbinder, MD;  Location: [place];  Service: General;  Laterality: Right;   AUGMENTATION MAMMAPLASTY Bilateral    biateral implants , approx 2015   BREAST LUMPECTOMY Right 11/02/2017   re-ex 11-20-17   BREAST LUMPECTOMY WITH RADIOACTIVE SEED AND SENTINEL LYMPH NODE BIOPSY Right 11/02/2017   Procedure:  BREAST LUMPECTOMY WITH RADIOACTIVE SEED AND SENTINEL LYMPH NODE BIOPSY;  Surgeon: Rolm Bookbinder, MD;  Location: [place];  Service: General;  Laterality: Right;   BRONCHIAL WASHINGS  02/21/2021   Procedure: BRONCHIAL WASHINGS;  Surgeon: Candee Furbish, MD;  Location: [facility] ENDOSCOPY;  Service: Pulmonary;;   CHOLECYSTECTOMY     collarbone     INCONTINENCE SURGERY     KNEE SURGERY      removal of cyst, repair of cartiledge   LAPAROSCOPY     for endometriosis   RE-EXCISION OF BREAST LUMPECTOMY Right 11/20/2017   Procedure: RE-EXCISION OF RIGHT BREAST MARGINS;  Surgeon: Rolm Bookbinder, MD;  Location: [place];  Service: General;  Laterality: Right;   ROTATOR CUFF REPAIR     left   VIDEO BRONCHOSCOPY Right 02/21/2021   Procedure: VIDEO BRONCHOSCOPY WITHOUT FLUORO;  Surgeon: Candee Furbish, MD;  Location: [facility] ENDOSCOPY;  Service: Pulmonary;  Laterality: Right;    Current Medications: No outpatient medications have been marked as taking for the 07/02/21 encounter (Appointment) with Ann Maki, Lanice Schwab, NP.     Allergies:   Percocet [oxycodone-acetaminophen]   Social History   Socioeconomic History   Marital status: Married    Spouse name: Not on file   Number of children: 2   Years of education: Not on file   Highest education level: Not on file  Occupational History   Not on file  Tobacco Use   Smoking status: Every Day    Packs/day: 1.00    Types: Cigarettes   Smokeless tobacco: Never   Tobacco comments:    e-cigs  Vaping Use   Vaping Use: Former  Substance and Sexual Activity   Alcohol use: Yes    Alcohol/week: 21.0 standard drinks    Types: 21 Standard drinks or equivalent per week    Comment: 2 per weekend day; denies during the week (not consistent with prior hx)   Drug use: No   Sexual activity: Not on file  Other Topics Concern   Not on file  Social History Narrative   Right Handed    Lives in a one story home   Social Determinants of Health   Financial Resource Strain: Not on file  Food Insecurity: Not on file  Transportation Needs: Not on file  Physical Activity: Not on file  Stress: Not on file  Social Connections: Not on file     Family History: The patient's ***family history includes Breast cancer in her maternal aunt; Breast cancer (age of onset: 26) in an other family member; Breast cancer (age of onset: 40) in  her sister; COPD in her mother; Colon polyps in her father; Prostate cancer (age of onset: 22) in her father. There is no history of Colon cancer.  ROS:   Please see the history of present illness.    *** All other systems reviewed and are negative.  Labs/Other Studies Reviewed:    The following studies were reviewed today:  Echo 02/21/21  1. Left ventricular ejection fraction, by estimation, is 40 to 45%. Left  ventricular ejection fraction by PLAX is 45 %. The left ventricle has  mildly decreased function. The left ventricle demonstrates global  hypokinesis. There is moderate left  ventricular hypertrophy of the basal-septal segment. Left ventricular  diastolic parameters are indeterminate.   2. Right ventricular systolic function is normal. The right ventricular  size is normal. There is normal pulmonary artery systolic pressure.   3. The mitral valve is normal in  structure. No evidence of mitral valve  regurgitation. No evidence of mitral stenosis.   4. Tricuspid valve regurgitation is moderate.   5. The aortic valve is tricuspid. Aortic valve regurgitation is not  visualized. No aortic stenosis is present.   6. The inferior vena cava is normal in size with greater than 50%  respiratory variability, suggesting right atrial pressure of 3 mmHg.   Cardiac monitor 04/23/18  Sinus rhythm with an average heart rate of 84 bpm Short runs of pSVT but no sustained arrhythmia Rare PVC's No atrial fibrillation or flutter No bradycardic events  Recent Labs: 04/19/2021: Hemoglobin 10.0; Platelets 188 04/21/2021: ALT 15 04/27/2021: BUN 5; Creatinine, Ser 0.50; Magnesium 1.5; Potassium 4.2; Sodium 127  Recent Lipid Panel    Component Value Date/Time   CHOL 157 12/10/2018 0211   TRIG 51 12/10/2018 0211   HDL 59 12/10/2018 0211   CHOLHDL 2.7 12/10/2018 0211   VLDL 10 12/10/2018 0211   LDLCALC 88 12/10/2018 0211     Risk Assessment/Calculations:   {Does this patient have ATRIAL  FIBRILLATION?[phone]}       Physical Exam:    VS:  There were no vitals taken for this visit.    Wt Readings from Last 3 Encounters:  04/15/21 135 lb (61.2 kg)  03/30/21 135 lb (61.2 kg)  03/04/21 130 lb (59 kg)     GEN: *** Well nourished, well developed in no acute distress HEENT: Normal NECK: No JVD; No carotid bruits CARDIAC: ***RRR, no murmurs, rubs, gallops RESPIRATORY:  Clear to auscultation without rales, wheezing or rhonchi  ABDOMEN: Soft, non-tender, non-distended MUSCULOSKELETAL:  No edema; No deformity. *** pedal pulses, ***bilaterally SKIN: Warm and dry NEUROLOGIC:  Alert and oriented x 3 PSYCHIATRIC:  Normal affect   EKG:  EKG is *** ordered today.  The ekg ordered today demonstrates ***  Diagnoses:    No diagnosis found. Assessment and Plan:     ***          {Are you ordering a CV Procedure (e.g. stress test, cath, DCCV, TEE, etc)?   Press F2        :[id number]}    Medication Adjustments/Labs and Tests Ordered: Current medicines are reviewed at length with the patient today.  Concerns regarding medicines are outlined above.  No orders of the defined types were placed in this encounter.  No orders of the defined types were placed in this encounter.   There are no Patient Instructions on file for this visit.   Signed, Emmaline Life, NP  06/29/2021 10:54 AM    [place]

## 2021-07-02 ENCOUNTER — Ambulatory Visit: Payer: Medicare Other | Admitting: Nurse Practitioner

## 2021-07-30 ENCOUNTER — Emergency Department (HOSPITAL_COMMUNITY)
Admission: EM | Admit: 2021-07-30 | Discharge: 2021-07-30 | Disposition: A | Discharge status: Home / Self Care | Payer: Medicare Other | Attending: Emergency Medicine | Admitting: Emergency Medicine

## 2021-07-30 ENCOUNTER — Emergency Department (HOSPITAL_COMMUNITY): Payer: Medicare Other

## 2021-07-30 ENCOUNTER — Other Ambulatory Visit: Payer: Self-pay

## 2021-07-30 DIAGNOSIS — S0990XA Unspecified injury of head, initial encounter: Secondary | ICD-10-CM

## 2021-07-30 DIAGNOSIS — D72829 Elevated white blood cell count, unspecified: Secondary | ICD-10-CM | POA: Insufficient documentation

## 2021-07-30 DIAGNOSIS — Z79899 Other long term (current) drug therapy: Secondary | ICD-10-CM | POA: Insufficient documentation

## 2021-07-30 DIAGNOSIS — S41111A Laceration without foreign body of right upper arm, initial encounter: Secondary | ICD-10-CM | POA: Diagnosis not present

## 2021-07-30 DIAGNOSIS — S0181XA Laceration without foreign body of other part of head, initial encounter: Secondary | ICD-10-CM | POA: Diagnosis not present

## 2021-07-30 DIAGNOSIS — F10920 Alcohol use, unspecified with intoxication, uncomplicated: Secondary | ICD-10-CM

## 2021-07-30 DIAGNOSIS — F10129 Alcohol abuse with intoxication, unspecified: Secondary | ICD-10-CM | POA: Diagnosis not present

## 2021-07-30 DIAGNOSIS — Y92019 Unspecified place in single-family (private) house as the place of occurrence of the external cause: Secondary | ICD-10-CM | POA: Insufficient documentation

## 2021-07-30 DIAGNOSIS — Y906 Blood alcohol level of 120-199 mg/100 ml: Secondary | ICD-10-CM | POA: Insufficient documentation

## 2021-07-30 DIAGNOSIS — Z853 Personal history of malignant neoplasm of breast: Secondary | ICD-10-CM | POA: Insufficient documentation

## 2021-07-30 DIAGNOSIS — I10 Essential (primary) hypertension: Secondary | ICD-10-CM | POA: Diagnosis not present

## 2021-07-30 DIAGNOSIS — F17210 Nicotine dependence, cigarettes, uncomplicated: Secondary | ICD-10-CM | POA: Insufficient documentation

## 2021-07-30 DIAGNOSIS — W19XXXA Unspecified fall, initial encounter: Secondary | ICD-10-CM | POA: Diagnosis not present

## 2021-07-30 DIAGNOSIS — F1010 Alcohol abuse, uncomplicated: Secondary | ICD-10-CM

## 2021-07-30 LAB — COMPREHENSIVE METABOLIC PANEL
ALT: 11 U/L (ref 0–44)
AST: 28 U/L (ref 15–41)
Albumin: 3.4 g/dL — ABNORMAL LOW (ref 3.5–5.0)
Alkaline Phosphatase: 89 U/L (ref 38–126)
Anion gap: 13 (ref 5–15)
BUN: 16 mg/dL (ref 8–23)
CO2: 24 mmol/L (ref 22–32)
Calcium: 9.1 mg/dL (ref 8.9–10.3)
Chloride: 101 mmol/L (ref 98–111)
Creatinine, Ser: 0.76 mg/dL (ref 0.44–1.00)
GFR, Estimated: >60 mL/min (ref >60)
Glucose, Bld: 89 mg/dL (ref 70–99)
Potassium: 4.1 mmol/L (ref 3.5–5.1)
Sodium: 138 mmol/L (ref 135–145)
Total Bilirubin: 0.5 mg/dL (ref 0.3–1.2)
Total Protein: 6.7 g/dL (ref 6.5–8.1)

## 2021-07-30 LAB — CBC WITH DIFFERENTIAL/PLATELET
Abs Immature Granulocytes: 0.03 10*3/uL (ref 0.00–0.07)
Basophils Absolute: 0 10*3/uL (ref 0.0–0.1)
Basophils Relative: 1 %
Eosinophils Absolute: 0 10*3/uL (ref 0.0–0.5)
Eosinophils Relative: 0 %
HCT: 35.3 % — ABNORMAL LOW (ref 36.0–46.0)
Hemoglobin: 11.2 g/dL — ABNORMAL LOW (ref 12.0–15.0)
Immature Granulocytes: 1 %
Lymphocytes Relative: 19 %
Lymphs Abs: 1.2 10*3/uL (ref 0.7–4.0)
MCH: 32.6 pg (ref 26.0–34.0)
MCHC: 31.7 g/dL (ref 30.0–36.0)
MCV: 102.6 fL — ABNORMAL HIGH (ref 80.0–100.0)
Monocytes Absolute: 0.6 10*3/uL (ref 0.1–1.0)
Monocytes Relative: 10 %
Neutro Abs: 4.4 10*3/uL (ref 1.7–7.7)
Neutrophils Relative %: 69 %
Platelets: 294 10*3/uL (ref 150–400)
RBC: 3.44 MIL/uL — ABNORMAL LOW (ref 3.87–5.11)
RDW: 17.6 % — ABNORMAL HIGH (ref 11.5–15.5)
WBC: 6.3 10*3/uL (ref 4.0–10.5)
nRBC: 0 % (ref 0.0–0.2)

## 2021-07-30 LAB — ETHANOL: Alcohol, Ethyl (B): 175 mg/dL — ABNORMAL HIGH (ref <10)

## 2021-07-30 MED ORDER — SODIUM CHLORIDE 0.9 % IV SOLN: INTRAVENOUS | Status: DC

## 2021-07-30 MED ORDER — LORAZEPAM 1 MG PO TABS: 1.0000 mg | ORAL_TABLET | Freq: Three times a day (TID) | ORAL | 0 refills | Status: DC | PRN

## 2021-07-30 MED ORDER — LIDOCAINE-EPINEPHRINE (PF) 2 %-1:200000 IJ SOLN
10.0000 mL | Freq: Once | INTRAMUSCULAR | Status: AC
  Administered 2021-07-30: 10 mL
  Filled 2021-07-30: qty 20

## 2021-07-30 MED ORDER — HYDROMORPHONE HCL 2 MG PO TABS: 2.0000 mg | ORAL_TABLET | Freq: Four times a day (QID) | ORAL | 0 refills | Status: DC | PRN

## 2021-07-30 MED ORDER — HYDROMORPHONE HCL 1 MG/ML IJ SOLN
1.0000 mg | Freq: Once | INTRAMUSCULAR | Status: AC
  Administered 2021-07-30: 1 mg via INTRAVENOUS
  Filled 2021-07-30: qty 1

## 2021-07-30 MED ORDER — LORAZEPAM 2 MG/ML IJ SOLN
1.0000 mg | Freq: Once | INTRAMUSCULAR | Status: AC
  Administered 2021-07-30: 1 mg via INTRAVENOUS
  Filled 2021-07-30: qty 1

## 2021-07-30 MED ORDER — SODIUM CHLORIDE 0.9 % IV BOLUS
1000.0000 mL | Freq: Once | INTRAVENOUS | Status: AC
Start: 2021-07-30 — End: 2021-07-30
  Administered 2021-07-30: 1000 mL via INTRAVENOUS

## 2021-07-30 NOTE — ED Notes (Signed)
Caregiver, Anderson Malta at bedside

## 2021-07-30 NOTE — Discharge Instructions (Addendum)
Suture removal in 5 to 7 days.  Keep the wound dry for 24 hours.  Then can wash with soap and water.  And then reapply antibiotic ointment daily.  CT head neck and face without any acute findings.  Take the hydrocodone as needed for pain.  Take the Ativan as needed to help you with alcohol withdrawal.  Follow-up with [facility] and/or local resources provided above.  Return for any new or worse symptoms.

## 2021-07-30 NOTE — ED Provider Notes (Signed)
[facility] EMERGENCY DEPARTMENT Provider Note   CSN: [id number] Arrival date & time: 07/30/21  1527     History  Chief Complaint  Patient presents with   Alcohol Intoxication    Mindy Young is a 76 y.o. female.  Patient brought in by EMS.  Had a fall at home.  Patient admits to drinking alcohol.  Patient thinks she struck her head on the countertop.  Possible loss of consciousness.  Has skin tears to the right arm.  Old skin tear to the left anterior leg ankle area.  Patient able to move all 4 extremities.  Patient with complaint of her head hurting.  Obvious large laceration to the central part of the forehead probably measuring about 4 cm.  Patient states she is not on any blood thinners.  Patient states that her tetanus is up to date.  Past medical history significant for alcohol abuse.  Chart review shows that she had subdural hematoma in February.  Other medical problems include hypertension and cervical fracture in 1999 breast cancer right breast gastroesophageal reflux disease.  Past surgical history significant for gallbladder removal breast lumpectomy in 2019 abdominal hysterectomy patient is an everyday smoker.  Smokes a pack a day.       Home Medications Prior to Admission medications   Medication Sig Start Date End Date Taking? Authorizing Provider  HYDROmorphone (DILAUDID) 2 MG tablet Take 1 tablet (2 mg total) by mouth every 6 (six) hours as needed for severe pain. 07/30/21  Yes Fredia Sorrow, MD  LORazepam (ATIVAN) 1 MG tablet Take 1 tablet (1 mg total) by mouth 3 (three) times daily as needed for anxiety. 07/30/21  Yes Fredia Sorrow, MD  albuterol (VENTOLIN HFA) 108 (90 Base) MCG/ACT inhaler Inhale 1-2 puffs into the lungs every 4 (four) hours as needed for wheezing or shortness of breath. 10/28/20   [provider]  Cyanocobalamin 3000 MCG CAPS Take 3,000 mcg by mouth every evening.    [provider]  diphenoxylate-atropine  (LOMOTIL) 2.5-0.025 MG tablet Take 1 tablet by mouth 4 (four) times daily as needed for diarrhea or loose stools. 02/23/21   Geradine Girt, DO  fluticasone-salmeterol (ADVAIR) 250-50 MCG/ACT AEPB Inhale 1 puff into the lungs 2 (two) times daily as needed (shortness of breath/wheezing).    [provider]  folic acid (FOLVITE) 1 MG tablet Take 1 tablet (1 mg total) by mouth daily. 02/24/21   Geradine Girt, DO  gabapentin (NEURONTIN) 100 MG capsule Take 100 mg by mouth 3 (three) times daily as needed for pain. 04/09/21   [provider]  levETIRAcetam (KEPPRA) 1000 MG tablet Take 1 tablet (1,000 mg total) by mouth 2 (two) times daily. 03/04/21   Rondel Jumbo, PA-C  losartan (COZAAR) 100 MG tablet Take 1 tablet (100 mg total) by mouth daily. 04/28/21   [place] Donnamarie Poag, DO  magnesium oxide (MAG-OX) 400 (240 Mg) MG tablet Take 2 tablets (800 mg total) by mouth 2 (two) times daily. 04/27/21   [place] Donnamarie Poag, DO  Melatonin 10 MG TABS Take 10 mg by mouth at bedtime.    [provider]  metoprolol succinate (TOPROL-XL) 50 MG 24 hr tablet Take 1 tablet (50 mg total) by mouth daily. Take with or immediately following a meal. Patient taking differently: Take 50 mg by mouth daily. 03/09/21 06/07/21  Loel Dubonnet, NP  mupirocin ointment (BACTROBAN) 2 % Apply 1 application topically daily. 04/06/21   [provider]  ondansetron (ZOFRAN) 4 MG tablet Take 4 mg by mouth every 8 (eight) hours as needed for nausea or vomiting.    [provider]  oxybutynin (DITROPAN-XL) 10 MG 24 hr tablet Take 10 mg by mouth every morning. 12/05/16   [provider]  QUEtiapine (SEROQUEL) 25 MG tablet Take 0.5 tablets (12.5 mg total) by mouth at bedtime. 04/27/21   [place] Donnamarie Poag, DO  sodium chloride 1 g tablet Take 1 tablet (1 g total) by mouth 2 (two) times daily with a meal. 04/27/21   [place] Donnamarie Poag, DO  thiamine 100 MG tablet Take 1 tablet (100 mg total) by mouth daily. 02/24/21    Geradine Girt, DO      Allergies    Percocet [oxycodone-acetaminophen]    Review of Systems   Review of Systems  Constitutional:  Negative for chills and fever.  HENT:  Negative for ear pain and sore throat.   Eyes:  Negative for pain and visual disturbance.  Respiratory:  Negative for cough and shortness of breath.   Cardiovascular:  Negative for chest pain and palpitations.  Gastrointestinal:  Negative for abdominal pain and vomiting.  Genitourinary:  Negative for dysuria and hematuria.  Musculoskeletal:  Negative for arthralgias and back pain.  Skin:  Positive for wound. Negative for color change and rash.  Neurological:  Positive for headaches. Negative for seizures and syncope.  All other systems reviewed and are negative.   Physical Exam Updated Vital Signs BP (!) 171/93   Pulse 94   Temp (!) 97.4 F (36.3 C) (Oral)   Resp 20   Ht 1.575 m ('5\' 2"'$ )   Wt 59 kg   SpO2 98%   BMI 23.78 kg/m  Physical Exam Vitals and nursing note reviewed.  Constitutional:      General: She is not in acute distress.    Appearance: Normal appearance. She is well-developed.  HENT:     Head: Normocephalic.     Comments: Bruising around both eyes left eye more than right.  Also some swelling to the bridge of the nose.  Middle forehead with about a 3 to 4 cm laceration that is gaping.  No active bleeding.    Mouth/Throat:     Mouth: Mucous membranes are moist.  Eyes:     Extraocular Movements: Extraocular movements intact.     Conjunctiva/sclera: Conjunctivae normal.     Pupils: Pupils are equal, round, and reactive to light.  Neck:     Comments: Patient spontaneously moving her neck back and forth.  But she does admit to having alcohol on board Cardiovascular:     Rate and Rhythm: Normal rate and regular rhythm.     Heart sounds: No murmur heard. Pulmonary:     Effort: Pulmonary effort is normal. No respiratory distress.     Breath sounds: Normal breath sounds.  Abdominal:      Palpations: Abdomen is soft.     Tenderness: There is no abdominal tenderness.  Musculoskeletal:        General: No swelling or deformity.     Cervical back: Normal range of motion.     Comments: Bandage to the left anterior distal leg just before the ankle from an old skin tear.  Fresh skin tears around the right elbow area.  Skin:    General: Skin is warm and dry.     Capillary Refill: Capillary refill takes less than 2 seconds.  Neurological:     General: No focal deficit present.  Mental Status: She is alert.     Cranial Nerves: No cranial nerve deficit.     Sensory: No sensory deficit.     Motor: No weakness.     Comments: Know she is in the hospital.  Psychiatric:        Mood and Affect: Mood normal.     ED Results / Procedures / Treatments   Labs (all labs ordered are listed, but only abnormal results are displayed) Labs Reviewed  COMPREHENSIVE METABOLIC PANEL - Abnormal; Notable for the following components:      Result Value   Albumin 3.4 (*)    All other components within normal limits  CBC WITH DIFFERENTIAL/PLATELET - Abnormal; Notable for the following components:   RBC 3.44 (*)    Hemoglobin 11.2 (*)    HCT 35.3 (*)    MCV 102.6 (*)    RDW 17.6 (*)    All other components within normal limits  ETHANOL - Abnormal; Notable for the following components:   Alcohol, Ethyl (B) 175 (*)    All other components within normal limits    EKG EKG Interpretation  Date/Time:  Friday July 30 2021 15:43:59 EDT Ventricular Rate:  91 PR Interval:  129 QRS Duration: 90 QT Interval:  363 QTC Calculation: 447 R Axis:   68 Text Interpretation: Sinus rhythm Confirmed by Fredia Sorrow [phone] on 07/30/2021 4:00:55 PM  Radiology CT Head Wo Contrast  Result Date: 07/30/2021 CLINICAL DATA:  Fall, hit head on chronic counter top EXAM: CT HEAD WITHOUT CONTRAST CT MAXILLOFACIAL WITHOUT CONTRAST CT CERVICAL SPINE WITHOUT CONTRAST TECHNIQUE: Multidetector CT imaging of the  head, cervical spine, and maxillofacial structures were performed using the standard protocol without intravenous contrast. Multiplanar CT image reconstructions of the cervical spine and maxillofacial structures were also generated. RADIATION DOSE REDUCTION: This exam was performed according to the departmental dose-optimization program which includes automated exposure control, adjustment of the mA and/or kV according to patient size and/or use of iterative reconstruction technique. COMPARISON:  05/18/2021 CT head, 04/15/2021 CT head, cervical spine, and maxillofacial 08/31/2020 CT maxillofacial FINDINGS: CT HEAD FINDINGS Brain: No acute infarct, hemorrhage, mass, mass effect, or midline shift. Previously noted chronic subdural hematoma overlying the left frontal lobe is no longer seen. No hydrocephalus or extra-axial collection. Vascular: No hyperdense vessel. Skull: No acute osseous abnormality. Right paramedial frontal scalp laceration. Other: None. CT MAXILLOFACIAL FINDINGS Osseous: Chronic deformity of the nasal bones, which appear similar to 08/31/2020. No acute fracture or mandibular dislocation. No aggressive osseous lesion. Orbits: Negative. No traumatic or inflammatory finding. Status post bilateral lens replacements. Sinuses: Clear. Soft tissues: Right paramedian frontal scalp laceration. CT CERVICAL SPINE FINDINGS Alignment: Unchanged anterolisthesis of C3 on C4 and C7 on T1. Fused anterolisthesis of C4 on C5. Unchanged trace retrolisthesis of C5 on C6. Skull base and vertebrae: No acute fracture or suspicious osseous lesion. Unchanged degenerative fusion of C4 and C5 Soft tissues and spinal canal: No prevertebral fluid or swelling. No visible canal hematoma. Disc levels: Redemonstrated degenerative changes, with significant disc height loss at C5-C6 and C6-C7. Redemonstrated severe craniocervical changes, left-greater-than-right. Upper chest: Minimally imaged apices demonstrate emphysematous changes.  Other: None. IMPRESSION: 1. No acute intracranial process. 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 2.  No acute fracture or traumatic listhesis in the cervical spine. 3. No acute facial bone fracture. 4. Right paramedian frontal scalp laceration. Electronically Signed   By: Merilyn Baba M.D.   On: 07/30/2021 18:04   CT Cervical Spine Wo  Contrast  Result Date: 07/30/2021 CLINICAL DATA:  Fall, hit head on chronic counter top EXAM: CT HEAD WITHOUT CONTRAST CT MAXILLOFACIAL WITHOUT CONTRAST CT CERVICAL SPINE WITHOUT CONTRAST TECHNIQUE: Multidetector CT imaging of the head, cervical spine, and maxillofacial structures were performed using the standard protocol without intravenous contrast. Multiplanar CT image reconstructions of the cervical spine and maxillofacial structures were also generated. RADIATION DOSE REDUCTION: This exam was performed according to the departmental dose-optimization program which includes automated exposure control, adjustment of the mA and/or kV according to patient size and/or use of iterative reconstruction technique. COMPARISON:  05/18/2021 CT head, 04/15/2021 CT head, cervical spine, and maxillofacial 08/31/2020 CT maxillofacial FINDINGS: CT HEAD FINDINGS Brain: No acute infarct, hemorrhage, mass, mass effect, or midline shift. Previously noted chronic subdural hematoma overlying the left frontal lobe is no longer seen. No hydrocephalus or extra-axial collection. Vascular: No hyperdense vessel. Skull: No acute osseous abnormality. Right paramedial frontal scalp laceration. Other: None. CT MAXILLOFACIAL FINDINGS Osseous: Chronic deformity of the nasal bones, which appear similar to 08/31/2020. No acute fracture or mandibular dislocation. No aggressive osseous lesion. Orbits: Negative. No traumatic or inflammatory finding. Status post bilateral lens replacements. Sinuses: Clear. Soft tissues: Right paramedian frontal scalp laceration. CT CERVICAL SPINE FINDINGS  Alignment: Unchanged anterolisthesis of C3 on C4 and C7 on T1. Fused anterolisthesis of C4 on C5. Unchanged trace retrolisthesis of C5 on C6. Skull base and vertebrae: No acute fracture or suspicious osseous lesion. Unchanged degenerative fusion of C4 and C5 Soft tissues and spinal canal: No prevertebral fluid or swelling. No visible canal hematoma. Disc levels: Redemonstrated degenerative changes, with significant disc height loss at C5-C6 and C6-C7. Redemonstrated severe craniocervical changes, left-greater-than-right. Upper chest: Minimally imaged apices demonstrate emphysematous changes. Other: None. IMPRESSION: 1. No acute intracranial process. 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 2.  No acute fracture or traumatic listhesis in the cervical spine. 3. No acute facial bone fracture. 4. Right paramedian frontal scalp laceration. Electronically Signed   By: Merilyn Baba M.D.   On: 07/30/2021 18:04   CT Maxillofacial WO CM  Result Date: 07/30/2021 CLINICAL DATA:  Fall, hit head on chronic counter top EXAM: CT HEAD WITHOUT CONTRAST CT MAXILLOFACIAL WITHOUT CONTRAST CT CERVICAL SPINE WITHOUT CONTRAST TECHNIQUE: Multidetector CT imaging of the head, cervical spine, and maxillofacial structures were performed using the standard protocol without intravenous contrast. Multiplanar CT image reconstructions of the cervical spine and maxillofacial structures were also generated. RADIATION DOSE REDUCTION: This exam was performed according to the departmental dose-optimization program which includes automated exposure control, adjustment of the mA and/or kV according to patient size and/or use of iterative reconstruction technique. COMPARISON:  05/18/2021 CT head, 04/15/2021 CT head, cervical spine, and maxillofacial 08/31/2020 CT maxillofacial FINDINGS: CT HEAD FINDINGS Brain: No acute infarct, hemorrhage, mass, mass effect, or midline shift. Previously noted chronic subdural hematoma overlying the left  frontal lobe is no longer seen. No hydrocephalus or extra-axial collection. Vascular: No hyperdense vessel. Skull: No acute osseous abnormality. Right paramedial frontal scalp laceration. Other: None. CT MAXILLOFACIAL FINDINGS Osseous: Chronic deformity of the nasal bones, which appear similar to 08/31/2020. No acute fracture or mandibular dislocation. No aggressive osseous lesion. Orbits: Negative. No traumatic or inflammatory finding. Status post bilateral lens replacements. Sinuses: Clear. Soft tissues: Right paramedian frontal scalp laceration. CT CERVICAL SPINE FINDINGS Alignment: Unchanged anterolisthesis of C3 on C4 and C7 on T1. Fused anterolisthesis of C4 on C5. Unchanged trace retrolisthesis of C5 on C6. Skull base and vertebrae: No acute fracture  or suspicious osseous lesion. Unchanged degenerative fusion of C4 and C5 Soft tissues and spinal canal: No prevertebral fluid or swelling. No visible canal hematoma. Disc levels: Redemonstrated degenerative changes, with significant disc height loss at C5-C6 and C6-C7. Redemonstrated severe craniocervical changes, left-greater-than-right. Upper chest: Minimally imaged apices demonstrate emphysematous changes. Other: None. IMPRESSION: 1. No acute intracranial process. 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 2.  No acute fracture or traumatic listhesis in the cervical spine. 3. No acute facial bone fracture. 4. Right paramedian frontal scalp laceration. Electronically Signed   By: Merilyn Baba M.D.   On: 07/30/2021 18:04    Procedures .Marland KitchenLaceration Repair  Date/Time: 07/30/2021 9:06 PM  Performed by: Fredia Sorrow, MD Authorized by: Fredia Sorrow, MD   Consent:    Consent obtained:  Verbal   Consent given by:  Patient   Risks, benefits, and alternatives were discussed: yes     Risks discussed:  Infection, pain and poor wound healing Universal protocol:    Procedure explained and questions answered to patient or proxy's  satisfaction: yes     Relevant documents present and verified: yes     Test results available: yes     Imaging studies available: yes     Immediately prior to procedure, a time out was called: yes     Patient identity confirmed:  Verbally with patient Anesthesia:    Anesthesia method:  Local infiltration   Local anesthetic:  Lidocaine 2% WITH epi Laceration details:    Location:  Face   Face location:  Forehead   Length (cm):  4   Depth (mm):  4 Pre-procedure details:    Preparation:  Patient was prepped and draped in usual sterile fashion Exploration:    Limited defect created (wound extended): no     Hemostasis achieved with:  Epinephrine   Imaging outcome: foreign body not noted     Wound exploration: wound explored through full range of motion     Contaminated: no   Treatment:    Area cleansed with:  Saline and povidone-iodine   Amount of cleaning:  Standard   Irrigation solution:  Sterile saline   Irrigation volume:  20   Irrigation method:  Syringe   Visualized foreign bodies/material removed: no     Debridement:  None   Undermining:  None   Scar revision: no   Skin repair:    Repair method:  Sutures   Suture size:  4-0   Suture material:  Prolene   Suture technique:  Simple interrupted   Number of sutures:  6 Approximation:    Approximation:  Loose Repair type:    Repair type:  Intermediate Post-procedure details:    Dressing:  Antibiotic ointment   Procedure completion:  Tolerated well, no immediate complications     Medications Ordered in ED Medications  0.9 %  sodium chloride infusion ( Intravenous New Bag/Given 07/30/21 1605)  sodium chloride 0.9 % bolus 1,000 mL (0 mLs Intravenous Stopped 07/30/21 1820)  lidocaine-EPINEPHrine (XYLOCAINE W/EPI) 2 %-1:200000 (PF) injection 10 mL (10 mLs Infiltration Given by Other 07/30/21 1933)  HYDROmorphone (DILAUDID) injection 1 mg (1 mg Intravenous Given 07/30/21 1929)  LORazepam (ATIVAN) injection 1 mg (1 mg Intravenous  Given 07/30/21 1929)    ED Course/ Medical Decision Making/ A&P                           Medical Decision Making Amount and/or Complexity of Data Reviewed Labs: ordered.  Radiology: ordered.  Risk Prescription drug management.  CT head neck and maxillofacial without any acute bony or brain abnormalities.  Patient's forehead wound sutured.  See procedure note.  Complete metabolic panel liver function test are very normal despite her history of drinking frequently.  Hemoglobin is 11.9 white count 6.3 platelets are normal at 295 alcohol level was 175.    Final Clinical Impression(s) / ED Diagnoses Final diagnoses:  Alcohol abuse  Alcoholic intoxication without complication ([place])  Fall, initial encounter  Injury of head, initial encounter  Forehead laceration, initial encounter    Rx / DC Orders ED Discharge Orders          Ordered    HYDROmorphone (DILAUDID) 2 MG tablet  Every 6 hours PRN        07/30/21 2115    LORazepam (ATIVAN) 1 MG tablet  3 times daily PRN        07/30/21 2115              Fredia Sorrow, MD 07/30/21 2116

## 2021-07-30 NOTE — ED Triage Notes (Signed)
Pt BIB EMS due to ETOH intoxication and pt fell. Pt hot head on granite countertop. Pt states she has problem with etoh abuse. Pt had LOC and has HA. Pt has a large head lac. No blood thinners. VSS. Axox4.

## 2021-07-30 NOTE — ED Notes (Signed)
Caregiver, Anderson Malta at bedside. Pt more coherent

## 2021-07-30 NOTE — ED Notes (Signed)
Patient transported to CT 

## 2021-08-05 ENCOUNTER — Other Ambulatory Visit: Payer: Self-pay

## 2021-08-05 ENCOUNTER — Emergency Department (HOSPITAL_COMMUNITY)
Admission: EM | Admit: 2021-08-05 | Discharge: 2021-08-06 | Disposition: A | Discharge status: Home / Self Care | Payer: Medicare Other | Attending: Emergency Medicine | Admitting: Emergency Medicine

## 2021-08-05 ENCOUNTER — Encounter (HOSPITAL_COMMUNITY): Payer: Self-pay

## 2021-08-05 ENCOUNTER — Emergency Department (HOSPITAL_COMMUNITY): Payer: Medicare Other

## 2021-08-05 DIAGNOSIS — R0781 Pleurodynia: Secondary | ICD-10-CM | POA: Insufficient documentation

## 2021-08-05 DIAGNOSIS — S022XXB Fracture of nasal bones, initial encounter for open fracture: Secondary | ICD-10-CM

## 2021-08-05 DIAGNOSIS — F10120 Alcohol abuse with intoxication, uncomplicated: Secondary | ICD-10-CM | POA: Diagnosis not present

## 2021-08-05 DIAGNOSIS — Y907 Blood alcohol level of 200-239 mg/100 ml: Secondary | ICD-10-CM | POA: Insufficient documentation

## 2021-08-05 DIAGNOSIS — S51811A Laceration without foreign body of right forearm, initial encounter: Secondary | ICD-10-CM | POA: Insufficient documentation

## 2021-08-05 DIAGNOSIS — R7989 Other specified abnormal findings of blood chemistry: Secondary | ICD-10-CM | POA: Diagnosis not present

## 2021-08-05 DIAGNOSIS — S0992XA Unspecified injury of nose, initial encounter: Secondary | ICD-10-CM | POA: Diagnosis present

## 2021-08-05 DIAGNOSIS — W19XXXA Unspecified fall, initial encounter: Secondary | ICD-10-CM | POA: Diagnosis not present

## 2021-08-05 DIAGNOSIS — F10920 Alcohol use, unspecified with intoxication, uncomplicated: Secondary | ICD-10-CM

## 2021-08-05 DIAGNOSIS — S0181XA Laceration without foreign body of other part of head, initial encounter: Secondary | ICD-10-CM

## 2021-08-05 DIAGNOSIS — R296 Repeated falls: Secondary | ICD-10-CM | POA: Diagnosis present

## 2021-08-05 LAB — I-STAT CHEM 8, ED
BUN: 8 mg/dL (ref 8–23)
Calcium, Ion: 1.11 mmol/L — ABNORMAL LOW (ref 1.15–1.40)
Chloride: 97 mmol/L — ABNORMAL LOW (ref 98–111)
Creatinine, Ser: 1.2 mg/dL — ABNORMAL HIGH (ref 0.44–1.00)
Glucose, Bld: 92 mg/dL (ref 70–99)
HCT: 40 % (ref 36.0–46.0)
Hemoglobin: 13.6 g/dL (ref 12.0–15.0)
Potassium: 3.7 mmol/L (ref 3.5–5.1)
Sodium: 130 mmol/L — ABNORMAL LOW (ref 135–145)
TCO2: 22 mmol/L (ref 22–32)

## 2021-08-05 LAB — ETHANOL: Alcohol, Ethyl (B): 236 mg/dL — ABNORMAL HIGH (ref <10)

## 2021-08-05 MED ORDER — AMOXICILLIN-POT CLAVULANATE 875-125 MG PO TABS: 1.0000 | ORAL_TABLET | Freq: Two times a day (BID) | ORAL | 0 refills | Status: DC

## 2021-08-05 MED ORDER — LIDOCAINE HCL (PF) 1 % IJ SOLN: 5.0000 mL | Freq: Once | INTRAMUSCULAR | Status: DC

## 2021-08-05 MED ORDER — SODIUM CHLORIDE 0.9 % IV BOLUS
1000.0000 mL | Freq: Once | INTRAVENOUS | Status: AC
  Administered 2021-08-05: 1000 mL via INTRAVENOUS

## 2021-08-05 MED ORDER — CEFAZOLIN SODIUM-DEXTROSE 1-4 GM/50ML-% IV SOLN
1.0000 g | Freq: Once | INTRAVENOUS | Status: AC
  Administered 2021-08-06: 1 g via INTRAVENOUS
  Filled 2021-08-05: qty 50

## 2021-08-05 MED ORDER — LIDOCAINE-EPINEPHRINE (PF) 2 %-1:200000 IJ SOLN
10.0000 mL | Freq: Once | INTRAMUSCULAR | Status: AC
  Administered 2021-08-05: 10 mL
  Filled 2021-08-05: qty 20

## 2021-08-05 NOTE — ED Notes (Signed)
This RN attempts to get IV started x2 without success at this time

## 2021-08-05 NOTE — ED Notes (Signed)
C collar in place with EMS

## 2021-08-05 NOTE — ED Notes (Signed)
IV team at bedside at this time. 

## 2021-08-05 NOTE — ED Triage Notes (Signed)
Per [employer]; EMS patient is a heavy drinker of whiskey and took a fall tonight landing mostly on the right side sustaining a lac above the right eyebrow and on the bridge of the nose. Bleeding controlled with EMS prior to arrival. EMS reports multiple old bruises. Patient was an unwitnessed fall patient can recall the situation and alert and oriented with EMS. [employer] watch called EMS on the fall. Patient having difficulty following commands on arrival Per EMS no blood thinners

## 2021-08-05 NOTE — ED Notes (Signed)
2 Rns have attempted to achieve IV access without success. Phlebotomy called at this time to attempt to draw blood

## 2021-08-05 NOTE — ED Provider Notes (Signed)
[facility] EMERGENCY DEPARTMENT Provider Note   CSN: [id number] Arrival date & time: 08/05/21  2039     History  Chief Complaint  Patient presents with   Mindy Young is a 76 y.o. female.  Patient is a 76 year old female who presents after a fall.  Per chart review, she has a history of alcohol abuse and has been here frequently for falls.  She had a subdural hematoma in February.  She has had a remote prior cervical fracture.  She cannot really say what caused her fall.  She thinks she tripped on a rug.  She has some lacerations to her face and swelling of her face.  She is complaining of pain in her right ribs.  She denies any other injuries.  History is limited due to her presumed alcohol intoxication.       Home Medications Prior to Admission medications   Medication Sig Start Date End Date Taking? Authorizing Provider  amoxicillin-clavulanate (AUGMENTIN) 875-125 MG tablet Take 1 tablet by mouth every 12 (twelve) hours. 08/05/21  Yes Malvin Johns, MD  albuterol (VENTOLIN HFA) 108 (90 Base) MCG/ACT inhaler Inhale 1-2 puffs into the lungs every 4 (four) hours as needed for wheezing or shortness of breath. 10/28/20   [provider]  Cyanocobalamin 3000 MCG CAPS Take 3,000 mcg by mouth every evening.    [provider]  diphenoxylate-atropine (LOMOTIL) 2.5-0.025 MG tablet Take 1 tablet by mouth 4 (four) times daily as needed for diarrhea or loose stools. 02/23/21   Geradine Girt, DO  fluticasone-salmeterol (ADVAIR) 250-50 MCG/ACT AEPB Inhale 1 puff into the lungs 2 (two) times daily as needed (shortness of breath/wheezing).    [provider]  folic acid (FOLVITE) 1 MG tablet Take 1 tablet (1 mg total) by mouth daily. 02/24/21   Geradine Girt, DO  gabapentin (NEURONTIN) 100 MG capsule Take 100 mg by mouth 3 (three) times daily as needed for pain. 04/09/21   [provider]  HYDROmorphone (DILAUDID) 2 MG tablet Take 1  tablet (2 mg total) by mouth every 6 (six) hours as needed for severe pain. 07/30/21   Fredia Sorrow, MD  levETIRAcetam (KEPPRA) 1000 MG tablet Take 1 tablet (1,000 mg total) by mouth 2 (two) times daily. 03/04/21   Rondel Jumbo, PA-C  LORazepam (ATIVAN) 1 MG tablet Take 1 tablet (1 mg total) by mouth 3 (three) times daily as needed for anxiety. 07/30/21   Fredia Sorrow, MD  losartan (COZAAR) 100 MG tablet Take 1 tablet (100 mg total) by mouth daily. 04/28/21   [place] Donnamarie Poag, DO  magnesium oxide (MAG-OX) 400 (240 Mg) MG tablet Take 2 tablets (800 mg total) by mouth 2 (two) times daily. 04/27/21   [place] Donnamarie Poag, DO  Melatonin 10 MG TABS Take 10 mg by mouth at bedtime.    [provider]  metoprolol succinate (TOPROL-XL) 50 MG 24 hr tablet Take 1 tablet (50 mg total) by mouth daily. Take with or immediately following a meal. Patient taking differently: Take 50 mg by mouth daily. 03/09/21 06/07/21  Loel Dubonnet, NP  mupirocin ointment (BACTROBAN) 2 % Apply 1 application topically daily. 04/06/21   [provider]  ondansetron (ZOFRAN) 4 MG tablet Take 4 mg by mouth every 8 (eight) hours as needed for nausea or vomiting.    [provider]  oxybutynin (DITROPAN-XL) 10 MG 24 hr tablet Take 10 mg by mouth every morning. 12/05/16  [provider]  QUEtiapine (SEROQUEL) 25 MG tablet Take 0.5 tablets (12.5 mg total) by mouth at bedtime. 04/27/21   [place] Donnamarie Poag, DO  sodium chloride 1 g tablet Take 1 tablet (1 g total) by mouth 2 (two) times daily with a meal. 04/27/21   [place] Donnamarie Poag, DO  thiamine 100 MG tablet Take 1 tablet (100 mg total) by mouth daily. 02/24/21   Geradine Girt, DO      Allergies    Percocet [oxycodone-acetaminophen]    Review of Systems   Review of Systems  Unable to perform ROS: Mental status change    Physical Exam Updated Vital Signs BP (!) 160/96   Pulse (!) 53   Temp 97.8 F (36.6 C) (Oral)   Resp 18   SpO2 99%  Physical  Exam Constitutional:      Appearance: She is well-developed.  HENT:     Head: Normocephalic.     Comments: Patient has a laceration to her forehead that appears to been recently sutured and the sutures have partially come out.  She also has a laceration to the bridge of her nose.  There is some swelling around her nose and over her maxilla bilaterally.  No malocclusion. Eyes:     Pupils: Pupils are equal, round, and reactive to light.  Neck:     Comments: C-collar in place.  There is no tenderness on palpation of the cervical, thoracic or lumbosacral spine Cardiovascular:     Rate and Rhythm: Normal rate and regular rhythm.     Heart sounds: Normal heart sounds.  Pulmonary:     Effort: Pulmonary effort is normal. No respiratory distress.     Breath sounds: Normal breath sounds. No wheezing or rales.     Comments: Positive tenderness in the right ribs, no crepitus or deformity no external trauma noted Chest:     Chest wall: Tenderness present.  Abdominal:     General: Bowel sounds are normal.     Palpations: Abdomen is soft.     Tenderness: There is no abdominal tenderness. There is no guarding or rebound.  Musculoskeletal:        General: Normal range of motion.     Comments: Patient has  skin tears to her right arm hand and lower extremities  Lymphadenopathy:     Cervical: No cervical adenopathy.  Skin:    General: Skin is warm and dry.     Findings: No rash.  Neurological:     Mental Status: She is alert.     Comments: Oriented to person and place she is moving all extremities symmetrically without focal deficits     ED Results / Procedures / Treatments   Labs (all labs ordered are listed, but only abnormal results are displayed) Labs Reviewed  ETHANOL - Abnormal; Notable for the following components:      Result Value   Alcohol, Ethyl (B) 236 (*)    All other components within normal limits  I-STAT CHEM 8, ED - Abnormal; Notable for the following components:   Sodium  130 (*)    Chloride 97 (*)    Creatinine, Ser 1.20 (*)    Calcium, Ion 1.11 (*)    All other components within normal limits    EKG None  Radiology CT Cervical Spine Wo Contrast  Result Date: 08/05/2021 CLINICAL DATA:  Trauma. EXAM: CT CERVICAL SPINE WITHOUT CONTRAST TECHNIQUE: Multidetector CT imaging of the cervical spine was performed without intravenous contrast. Multiplanar CT image reconstructions  were also generated. RADIATION DOSE REDUCTION: This exam was performed according to the departmental dose-optimization program which includes automated exposure control, adjustment of the mA and/or kV according to patient size and/or use of iterative reconstruction technique. COMPARISON:  Cervical spine CT 07/30/2021. FINDINGS: Alignment: There is 2 mm of anterolisthesis at C3-C4 and 3 mm of anterolisthesis at C4-C5 which appears unchanged. There is also 2 mm of anterolisthesis at C6-C7. This is favored is degenerative. Alignment is otherwise anatomic. Skull base and vertebrae: No acute fracture. No primary bone lesion or focal pathologic process. Soft tissues and spinal canal: No prevertebral fluid or swelling. No visible canal hematoma. Disc levels: There is complete loss of disc space and vertebral body fusion at C4-C5 which is unchanged. There is severe disc space narrowing at C5-C6 and C6-C7 with endplate osteophyte formation compatible with degenerative change. Neural foraminal stenosis is seen on the right at C3-C4 secondary to facet arthropathy and bilaterally at C5-C6 and C6-C7 secondary to uncovertebral spurring. There is no severe central canal stenosis at any level. Upper chest: There is scarring in the right lung apex. Other: None. IMPRESSION: 1. No acute fracture or traumatic subluxation. 2. Stable severe multilevel degenerative changes. Electronically Signed   By: Ronney Asters M.D.   On: 08/05/2021 23:16   CT Maxillofacial WO CM  Result Date: 08/05/2021 CLINICAL DATA:  Facial trauma,  blunt.  Fall. EXAM: CT MAXILLOFACIAL WITHOUT CONTRAST TECHNIQUE: Multidetector CT imaging of the maxillofacial structures was performed. Multiplanar CT image reconstructions were also generated. RADIATION DOSE REDUCTION: This exam was performed according to the departmental dose-optimization program which includes automated exposure control, adjustment of the mA and/or kV according to patient size and/or use of iterative reconstruction technique. COMPARISON:  None Available. FINDINGS: Osseous: Fractures are noted through the nasal bones, slightly displaced. No nasal septal fracture. No additional facial fracture. Orbits: Negative. No traumatic or inflammatory finding. Sinuses: Clear Soft tissues: Soft tissue swelling over the nose. Limited intracranial: See head CT report IMPRESSION: Minimally displaced nasal bone fractures. Electronically Signed   By: Rolm Baptise M.D.   On: 08/05/2021 23:13   CT Head Wo Contrast  Result Date: 08/05/2021 CLINICAL DATA:  Head trauma, minor (Age >= 65y) EXAM: CT HEAD WITHOUT CONTRAST TECHNIQUE: Contiguous axial images were obtained from the base of the skull through the vertex without intravenous contrast. RADIATION DOSE REDUCTION: This exam was performed according to the departmental dose-optimization program which includes automated exposure control, adjustment of the mA and/or kV according to patient size and/or use of iterative reconstruction technique. COMPARISON:  None Available. FINDINGS: Brain: No acute intracranial abnormality. Specifically, no hemorrhage, hydrocephalus, mass lesion, acute infarction, or significant intracranial injury. There is atrophy and chronic small vessel disease changes. Vascular: No hyperdense vessel or unexpected calcification. Skull: No acute calvarial abnormality. Sinuses/Orbits: No acute findings Other: None IMPRESSION: Atrophy, chronic microvascular disease. No acute intracranial abnormality. Electronically Signed   By: Rolm Baptise M.D.    On: 08/05/2021 23:12   DG Ribs Unilateral W/Chest Right  Result Date: 08/05/2021 CLINICAL DATA:  Fall with rib pain. EXAM: RIGHT RIBS AND CHEST - 3+ VIEW COMPARISON:  Chest x-ray 05/18/2021 FINDINGS: No acute fractures are identified. Chronic right third and fourth rib fractures are unchanged. There is a chronic right tenth rib fracture as well. There is no evidence of pneumothorax or pleural effusion. There is atelectasis in the right lung base. Lungs are otherwise clear. Heart size and mediastinal contours are within normal limits. IMPRESSION: No acute right-sided  rib fractures. Right basilar atelectasis. Electronically Signed   By: Ronney Asters M.D.   On: 08/05/2021 22:21    Procedures Procedures    Medications Ordered in ED Medications  ceFAZolin (ANCEF) IVPB 1 g/50 mL premix (has no administration in time range)  lidocaine-EPINEPHrine (XYLOCAINE W/EPI) 2 %-1:200000 (PF) injection 10 mL (10 mLs Infiltration Given by Other 08/05/21 2300)  sodium chloride 0.9 % bolus 1,000 mL (1,000 mLs Intravenous New Bag/Given 08/05/21 2300)    ED Course/ Medical Decision Making/ A&P                           Medical Decision Making Amount and/or Complexity of Data Reviewed Labs: ordered. Radiology: ordered.  Risk Prescription drug management.   Patient is a 76 year old female who presents with alcohol intoxication and a fall.  She has shill trauma present.  She had CT scan of her head maxillofacial bones and cervical spine which show no acute abnormality other than a nasal bone fracture.  There is an overlying laceration which makes an open fracture.  No septal hematoma.  No other acute traumatic injuries.  She had x-rays of her ribs which showed no evidence of rib fractures.  No evidence of pneumothorax.  This was interpreted by me and confirmed by the radiologist.  She does not have associate abdominal tenderness.  Lacerations were repaired by Astrid Drafts, PA-C.  Her tetanus shot is up-to-date.   Her labs were checked and her alcohol level is elevated.  Her creatinine is slightly higher than prior values.  She will need a period of observation and can be discharged assuming that she wakes up appropriately.  Care turned over to Dr. Christy Gentles pending reassessment.  She will need her sutures removed in about 7 days.  She will need to have her creatinine rechecked by primary care doctor.  She will need to be on antibiotics for the open nasal bone fracture.  I have put this on her discharge papers as well as a referral to follow-up with ENT.  Final Clinical Impression(s) / ED Diagnoses Final diagnoses:  Fall, initial encounter  Facial laceration, initial encounter  Open fracture of nasal bone, initial encounter  Alcoholic intoxication without complication ([place])  Elevated serum creatinine    Rx / DC Orders ED Discharge Orders          Ordered    amoxicillin-clavulanate (AUGMENTIN) 875-125 MG tablet  Every 12 hours        08/05/21 2331              Malvin Johns, MD 08/05/21 2337

## 2021-08-05 NOTE — ED Notes (Signed)
Patient has multiple skin abrasions on bilateral upper and lower extremities. All superficial and bleeding controlled. Patient complaining of right sided pain but able to move all extremities with ease.

## 2021-08-05 NOTE — Discharge Instructions (Addendum)
You have a broken nose.  Take the antibiotics as discussed.  I have given you information about following up with the ear nose and throat doctor listed above.  You will need to call to make an appointment.  Return to the emergency room if you have any worsening symptoms.  Your kidney function was slightly worse than it normally is.  You should follow-up with your primary care doctor to have this rechecked.  You need to have your sutures removed in about 7 days.    Substance Abuse Treatment Programs  Intensive Outpatient Programs [facility]     [address], [place]       [facility] [place] [address] [place], [place]  [facility]     (Inpatient and outpatient)     [address]           [place] [phone] (Suboxone and Methadone)  [place], [place] [address]      [place] [address] [place], [place]  [facility] [place] (Outpatient/Inpatient, [place])    (insurance only) [phone]             [facility] ([place]) [place], [place]     [facility]     [address]     [place], [place]       [facility] (for caregivers and family) [phone] [place]een [place]. [place], [place]      Residential Treatment Programs [facility]      [address] [place], [place] [address]  [phone]       [facility] [place], [place] [address] [phone]  [facility] [place]        [phone]       [facility] [place] [phone]  [facility] ([place]             [place], [place] or [place] [address] [place], [address] [phone]  [facility] [place]    [address]      [place], [place]       [facility] [address] [place], [place]  [place]   [address] [place], [place] [address]     [phone]      Admissions: 8am-3pm M-F  [facility] (RTS) [address]  [place], [place]  [facility] Program: Residential Program ([address])   [place], [place] or [phone]     [facility]: [place], [place] (Walk in Hours over the weekend or by referral)  [facility] [place], [place], [place] [address] [phone]  Crisis Mobile: [facility]:  [phone] (for crisis response 24 hours a day) [facility] Hotline:      [phone] Outpatient Psychiatry and Counseling  [facility]: Mobile Crisis Management 24 hours:  [phone]  [facility] of the [employer] sliding scale fee and walk in schedule: M-F 8am-12pm/1pm-3pm [place], [place] [address] [place], [place] [address] [phone]  [facility] ([facility] [employer])- new patient walk-in appointments available Monday - Friday 8am -3pm.          [address] [place], [place] [address] [phone] or crisis line- [facility]/ Intensive Outpatient Therapy Program [place], [place] [address] [place]      [phone] [address] [place], [place] [address]                 [place]   [facility] [phone]. [place], [place] [address]   [employer] of  Care          [address] Johnette Abraham  [place], [place] [address]       [phone]  [place], [place], [place] [address] [phone]  [facility]    [address] [place], [place] [address]     [place], [place] Joycelyn Man     [place] [address]     [phone]       [facility] [place] [address]  [facility]     [address], [place], [place], [place] [address] [place]     [address]     [place], [place] [address]     [phone]       [facility] [address] [place], [place] [address] [phone] Resources for Temporary Residential Assistance/Crisis [place] [facility]) M-F 8am-3pm   [address] [place], [place] [address]   [phone] Services include: laundry, barbering, support groups, case management, phone  & computer access, showers, AA/NA mtgs, mental health/substance abuse nurse, job skills class, disability information, VA assistance, spiritual classes, etc.   [facility]   [address] [place]     [place] (women and children)       [place]. [place], [place] [address] [phone] [email or web address] for application and process Application Required  [facility]   [address]    [place] [address]     [phone]                    [place], [place] [address] [phone]([email or web address] application appt.) Application Required  [facility] (women only)    [address]     [place], [place] [address]     [phone]      Intake starts 6pm daily Need valid ID, SSC, & Police report [facility] [address] [place], [place] [phone] Application Required  [employer] (men only)     [place].      [place], [place]       [place] (Pregnant women only) [address] [place], [place]  [facility]      [address]      [place], [place] [address]     [phone]             [facility] [address] [place], [place] 90 day commitment/SA/Application process  [facility](men only)     [address]     [place], [place]       Check-in at [facility] [address] [place], [place] [address] [phone] Men/Women/Women and Children must be there by 7 pm  [place], [place]                low-up with your primary care doctor or an urgent care for this.

## 2021-08-05 NOTE — ED Notes (Signed)
Patient transported to CT 

## 2021-08-05 NOTE — ED Notes (Signed)
Provider at bedside to preform bedside procedure

## 2021-08-06 NOTE — ED Provider Notes (Signed)
EKG Interpretation  Date/Time:  Friday August 06 2021 00:18:47 EDT Ventricular Rate:  85 PR Interval:  162 QRS Duration: 114 QT Interval:  404 QTC Calculation: 481 R Axis:   90 Text Interpretation: Sinus rhythm Borderline intraventricular conduction delay Artifact in lead(s) I II III aVR aVL aVF V1 V6 Interpretation limited secondary to artifact Confirmed by Ripley Fraise [phone]) on 08/06/2021 12:34:32 AM        Patient is awake and alert but still feels like she cannot walk.  Patient was heavily intoxicated which triggered her fall.  Her wounds have been cleaned and repaired already.  Plan to continue to monitor then likely discharge.   Ripley Fraise, MD 08/06/21 [phone]

## 2021-08-06 NOTE — ED Notes (Signed)
EKG completed at this time.

## 2021-08-06 NOTE — ED Notes (Signed)
Patient resting comfortably at this time; care taker at bedside notified that we are waiting on the patient to pass an ambulatory trial when she feels like she can walk. Care taker reports awareness of of the plan of care

## 2021-08-06 NOTE — ED Notes (Signed)
This RN woke the patient up at this time patient states that she feels like she can walk but is not going to get up under any circumstance because she is cold and irritated. Even though this RN has done her best to educate the importance on getting up and ambulating before discharge the patient still refuses. Caregiver at beside request that the patient get something sent home with her for pain and anxiety. This RN relays the message to the provider and provider reports that he will come to bedside when he is able to to discuss further with the patient.

## 2021-08-06 NOTE — ED Notes (Signed)
Patient reports to provider at bedside that she doesn't feel like she can walk right now

## 2021-08-06 NOTE — ED Provider Notes (Signed)
Patient is more awake and alert but is now requesting discharge.  She refuses to walk but she feels she is at her baseline. There are no other signs of trauma Discussed need for follow-up with otolaryngology and given antibiotic Patient and caregiver report that she has follow-up PCP later today  Also discussed concern about her alcohol use.  Patient understands that she is abusing alcohol.  She would like to seek treatment as an outpatient.  She was given outpatient resources. No signs of significant withdrawal at this time   Ripley Fraise, MD 08/06/21 [phone]

## 2021-08-06 NOTE — ED Notes (Signed)
Patient's faced cleaned with warm compress and attempted to loosen dry blood

## 2021-08-06 NOTE — ED Notes (Signed)
Patients  face cleaned again with hydrogen peroxide by nurse tech

## 2021-08-06 NOTE — ED Notes (Signed)
Dry blood cleaned from the patient's face by this RN and family member post suture.

## 2021-11-02 ENCOUNTER — Ambulatory Visit: Payer: Medicare Other | Admitting: Family Medicine

## 2021-11-18 ENCOUNTER — Encounter: Payer: Self-pay | Admitting: Nurse Practitioner

## 2021-11-18 ENCOUNTER — Ambulatory Visit (INDEPENDENT_AMBULATORY_CARE_PROVIDER_SITE_OTHER): Payer: Medicare Other | Admitting: Nurse Practitioner

## 2021-11-18 VITALS — BP 110/80 | HR 87 | Temp 97.5°F | Resp 14 | Ht 62.0 in | Wt 123.0 lb

## 2021-11-18 DIAGNOSIS — F339 Major depressive disorder, recurrent, unspecified: Secondary | ICD-10-CM

## 2021-11-18 DIAGNOSIS — Z0000 Encounter for general adult medical examination without abnormal findings: Secondary | ICD-10-CM

## 2021-11-18 DIAGNOSIS — F10229 Alcohol dependence with intoxication, unspecified: Secondary | ICD-10-CM | POA: Diagnosis not present

## 2021-11-18 DIAGNOSIS — F1020 Alcohol dependence, uncomplicated: Secondary | ICD-10-CM | POA: Diagnosis not present

## 2021-11-18 DIAGNOSIS — F411 Generalized anxiety disorder: Secondary | ICD-10-CM | POA: Diagnosis not present

## 2021-11-18 MED ORDER — BUSPIRONE HCL 10 MG PO TABS: 10.0000 mg | ORAL_TABLET | Freq: Two times a day (BID) | ORAL | 1 refills | Status: DC

## 2021-11-18 NOTE — Patient Instructions (Addendum)
We will call you with lab results Decrease Buspar to 10 mg twice times daily Monitor for fall safety Follow-up in 1-week   Fall Prevention in the Home, Adult Falls can cause injuries and can happen to people of all ages. There are many things you can do to make your home safe and to help prevent falls. Ask for help when making these changes. What actions can I take to prevent falls? General Instructions Use good lighting in all rooms. Replace any light bulbs that burn out. Turn on the lights in dark areas. Use night-lights. Keep items that you use often in easy-to-reach places. Lower the shelves around your home if needed. Set up your furniture so you have a clear path. Avoid moving your furniture around. Do not have throw rugs or other things on the floor that can make you trip. Avoid walking on wet floors. If any of your floors are uneven, fix them. Add color or contrast paint or tape to clearly mark and help you see: Grab bars or handrails. First and last steps of staircases. Where the edge of each step is. If you use a stepladder: Make sure that it is fully opened. Do not climb a closed stepladder. Make sure the sides of the stepladder are locked in place. Ask someone to hold the stepladder while you use it. Know where your pets are when moving through your home. What can I do in the bathroom?     Keep the floor dry. Clean up any water on the floor right away. Remove soap buildup in the tub or shower. Use nonskid mats or decals on the floor of the tub or shower. Attach bath mats securely with double-sided, nonslip rug tape. If you need to sit down in the shower, use a plastic, nonslip stool. Install grab bars by the toilet and in the tub and shower. Do not use towel bars as grab bars. What can I do in the bedroom? Make sure that you have a light by your bed that is easy to reach. Do not use any sheets or blankets for your bed that hang to the floor. Have a firm chair with  side arms that you can use for support when you get dressed. What can I do in the kitchen? Clean up any spills right away. If you need to reach something above you, use a step stool with a grab bar. Keep electrical cords out of the way. Do not use floor polish or wax that makes floors slippery. What can I do with my stairs? Do not leave any items on the stairs. Make sure that you have a light switch at the top and the bottom of the stairs. Make sure that there are handrails on both sides of the stairs. Fix handrails that are broken or loose. Install nonslip stair treads on all your stairs. Avoid having throw rugs at the top or bottom of the stairs. Choose a carpet that does not hide the edge of the steps on the stairs. Check carpeting to make sure that it is firmly attached to the stairs. Fix carpet that is loose or worn. What can I do on the outside of my home? Use bright outdoor lighting. Fix the edges of walkways and driveways and fix any cracks. Remove anything that might make you trip as you walk through a door, such as a raised step or threshold. Trim any bushes or trees on paths to your home. Check to see if handrails are loose or broken  and that both sides of all steps have handrails. Install guardrails along the edges of any raised decks and porches. Clear paths of anything that can make you trip, such as tools or rocks. Have leaves, snow, or ice cleared regularly. Use sand or salt on paths during winter. Clean up any spills in your garage right away. This includes grease or oil spills. What other actions can I take? Wear shoes that: Have a low heel. Do not wear high heels. Have rubber bottoms. Feel good on your feet and fit well. Are closed at the toe. Do not wear open-toe sandals. Use tools that help you move around if needed. These include: Canes. Walkers. Scooters. Crutches. Review your medicines with your doctor. Some medicines can make you feel dizzy. This can  increase your chance of falling. Ask your doctor what else you can do to help prevent falls. Where to find more information Centers for Disease Control and Prevention, [place]: http://www.wolf.info/ National Institute on Aging: http://kim-miller.com/ Contact a doctor if: You are afraid of falling at home. You feel weak, drowsy, or dizzy at home. You fall at home. Summary There are many simple things that you can do to make your home safe and to help prevent falls. Ways to make your home safe include removing things that can make you trip and installing grab bars in the bathroom. Ask for help when making these changes in your home. This information is not intended to replace advice given to you by your health care provider. Make sure you discuss any questions you have with your health care provider. Document Revised: 11/09/2020 Document Reviewed: 09/11/2019 Elsevier Patient Education  [place].   Alcohol Misuse and Dependence Information, Adult Alcohol is a widely available drug and people choose to drink alcohol in different amounts. Alcohol misuse and dependence can have a negative effect on your life. Alcohol misuse is when you use alcohol too much or too often. You may have a hard time setting a limit on the amount you drink. Alcohol dependence is when you use alcohol consistently for a period of time, and your body changes as a result. Alcohol dependence can make it hard for you to stop drinking because you may start to feel sick or different when you do not drink alcohol. These symptoms are known as withdrawal. People who drink alcohol very often and in large amounts, may develop what is called an alcohol use disorder. How can alcohol misuse and dependence affect me? Drinking too much can lead to addiction. You may feel like you need alcohol to function normally. You may drink alcohol before work in the morning, during the day, or as soon as you get home from work in the evening. These actions  can result in: Poor work performance. Job loss. Financial problems. Car crashes or criminal charges from driving after drinking alcohol. Problems in your relationships with friends and family. Losing the trust and respect of coworkers, friends, and family. Drinking heavily over a long period of time can permanently damage your body and brain, and can cause lifelong health issues, such as: Damage to your liver or pancreas. Heart problems, high blood pressure, or stroke. Certain cancers. Decreased ability to fight infections. Brain or nerve damage. Depression. Early death, also called premature death. If you are careless or you crave alcohol, it is easy to drink more than your body can handle (overdose). Alcohol overdose is a serious situation that requires hospitalization. It may lead to permanent injuries or death. What can  increase my risk? Having a family history of alcohol misuse. Having depression or other mental health conditions. Beginning to drink at an early age. Binge drinking often. Experiencing trauma, stress, and an unstable home life during childhood. Spending time with people who drink often. What actions can I take to prevent alcohol misuse and dependence? Do not drink alcohol if: Your health care provider tells you not to drink. You are pregnant, may be pregnant, or are planning to become pregnant. If you drink alcohol: Limit how much you have to: 0-1 drink a day for women who are not pregnant. 0-2 drinks a day for men. Know how much alcohol is in your drink. In the [place] one drink equals one 12 oz bottle of beer (355 mL), one 5 oz glass of wine (148 mL), or one 1 oz glass of hard liquor (44 mL). If you think you have an alcohol dependency problem, decide to stop drinking. This can be very hard to do if you are used to frequently drinking alcohol. If you begin to have withdrawal symptoms, talk with your health care provider or a person that you trust. These symptoms  may include anxiety, shaky hands, headache, nausea, sweating, or not being able to sleep. Choose to drink nonalcoholic beverages in social gatherings and places where there may be alcohol. Activity Spend more time on activities that you enjoy that do not involve alcohol, like hobbies or exercise. Find healthy ways to cope with stress, such as meditation or spending time with people you care about. General information Talk to your family, coworkers, and friends about supporting you in your efforts to stop drinking. If they drink, ask them not to drink around you. Spend more time with people who do not drink alcohol. If you think that you have an alcohol dependency problem: Tell friends or family about your concerns. Talk with your health care provider or another health professional about where to get help. Work with a Transport planner and a Regulatory affairs officer. Consider joining a support group for people who struggle with alcohol misuse and dependence. Where to find support  Your health care provider. SMART Recovery: smartrecovery.org Local treatment centers or chemical dependency counselors. Local AA groups in your community: ShedSizes.ch Where to find more information Centers for Disease Control and Prevention: StoreMirror.com.cy [employer] on Alcohol Abuse and Alcoholism: LinkVoyage.dk Alcoholics Anonymous (AA): ShedSizes.ch Contact a health care provider if: You drank more or for longer than you intended on more than one occasion. You often drink to the point of vomiting or passing out. You have problems in your life due to drinking, but you continue to drink. You keep drinking even though you feel anxious, depressed, or have experienced memory loss. You have stopped doing the things you used to enjoy in order to drink. You have to drink more than you used to in order to get the effect you want. You experience anxiety, sweating, nausea, shakiness, and trouble sleeping when you try to stop  drinking. Get help right away if: You have serious withdrawal symptoms, including: Confusion. Racing heart. High blood pressure. Fever. These symptoms may be an emergency. Get help right away. Call 911. Do not wait to see if the symptoms will go away. Do not drive yourself to the hospital. Also, get help right away if: You have thoughts about hurting yourself or others. Take one of these steps if you feel like you may hurt yourself or others, or have thoughts about taking your own life: Call 911. Call the  National Suicide Prevention Lifeline at 703-798-0365 or 49. This is open 24 hours a day. Text the Crisis Text Line at 702-335-2470. Summary Alcohol misuse and dependence can have a negative effect on your life. Drinking too much or too often can lead to addiction. If you drink alcohol, limit how much you use. If you are having trouble keeping your drinking under control, find ways to change your behavior. Hobbies, calming activities, exercise, or support groups can help. If you feel you need help with changing your drinking habits, talk with your health care provider, a good friend, or a therapist, or go to a support group. This information is not intended to replace advice given to you by your health care provider. Make sure you discuss any questions you have with your health care provider. Document Revised: 04/14/2021 Document Reviewed: 04/14/2021 Elsevier Patient Education  [place] Depression, Adult Depression is a mental health condition that affects your thoughts, feelings, and actions. Being diagnosed with depression can bring you relief if you did not know why you have felt or behaved a certain way. It could also leave you feeling overwhelmed with uncertainty about your future. Preparing yourself to manage your symptoms can help you feel more positive about your future. How to manage lifestyle changes Managing stress  Stress is your body's reaction to  life changes and events, both good and bad. Stress can add to your feelings of depression. Learning to manage your stress can help lessen your feelings of depression. Try some of the following approaches to reducing your stress (stress reduction techniques): Listen to music that you enjoy and that inspires you. Try using a meditation app or take a meditation class. Develop a practice that helps you connect with your spiritual self. Walk in nature, pray, or go to a place of worship. Do some deep breathing. To do this, inhale slowly through your nose. Pause at the top of your inhale for a few seconds and then exhale slowly, letting your muscles relax. Practice yoga to help relax and work your muscles. Choose a stress reduction technique that suits your lifestyle and personality. These techniques take time and practice to develop. Set aside 5-15 minutes a day to do them. Therapists can offer training in these techniques. Other things you can do to manage stress include: Keeping a stress diary. Knowing your limits and saying no when you think something is too much. Paying attention to how you react to certain situations. You may not be able to control everything, but you can change your reaction. Adding humor to your life by watching funny films or TV shows. Making time for activities that you enjoy and that relax you.  Medicines Medicines, such as antidepressants, are often a part of treatment for depression. Talk with your pharmacist or health care provider about all the medicines, supplements, and herbal products that you take, their possible side effects, and what medicines and other products are safe to take together. Make sure to report any side effects you may have to your health care provider. Relationships Your health care provider may suggest family therapy, couples therapy, or individual therapy as part of your treatment. How to recognize changes Everyone responds differently to treatment  for depression. As you recover from depression, you may start to: Have more interest in doing activities. Feel less hopeless. Have more energy. Overeat less often, or have a better appetite. Have better mental focus. It is important to recognize if your depression is  not getting better or is getting worse. The symptoms you had in the beginning may return, such as: Tiredness (fatigue) or low energy. Eating too much or too little. Sleeping too much or too little. Feeling restless, agitated, or hopeless. Trouble focusing or making decisions. Unexplained physical complaints. Feeling irritable, angry, or aggressive. If you or your family members notice these symptoms coming back, let your health care provider know right away. Follow these instructions at home: Activity  Try to get some form of exercise each day, such as walking, biking, swimming, or lifting weights. Practice stress reduction techniques. Engage your mind by taking a class or doing some volunteer work. Lifestyle Get the right amount and quality of sleep. Cut down on using caffeine, tobacco, alcohol, and other potentially harmful substances. Eat a healthy diet that includes plenty of vegetables, fruits, whole grains, low-fat dairy products, and lean protein. Do not eat a lot of foods that are high in solid fats, added sugars, or salt (sodium). General instructions Take over-the-counter and prescription medicines only as told by your health care provider. Keep all follow-up visits as told by your health care provider. This is important. Where to find support Talking to others  Friends and family members can be sources of support and guidance. Talk to trusted friends or family members about your condition. Explain your symptoms to them, and let them know that you are working with a health care provider to treat your depression. Tell friends and family members how they also can be helpful. Finances Find appropriate mental  health providers that fit with your financial situation. Talk with your health care provider about options to get reduced prices on your medicines. Where to find more information You can find support in your area from: Anxiety and Depression Association of America (ADAA): www.adaa.org Mental Health America: www.mentalhealthamerica.net [employer] on Mental Illness: www.nami.org Contact a health care provider if: You stop taking your antidepressant medicines, and you have any of these symptoms: Nausea. Headache. Light-headedness. Chills and body aches. Not being able to sleep (insomnia). You or your friends and family think your depression is getting worse. Get help right away if: You have thoughts of hurting yourself or others. If you ever feel like you may hurt yourself or others, or have thoughts about taking your own life, get help right away. Go to your nearest emergency department or: Call your local emergency services (911 in the [place] Call a suicide crisis helpline, such as the [place] at (639) 471-7015 or 988 in the [place]. This is open 24 hours a day in the [place] Text the Crisis Text Line at 785-009-5162 (in the [place].). Summary If you are diagnosed with depression, preparing yourself to manage your symptoms is a good way to feel positive about your future. Work with your health care provider on a management plan that includes stress reduction techniques, medicines (if applicable), therapy, and healthy lifestyle habits. Keep talking with your health care provider about how your treatment is working. If you have thoughts about taking your own life, call a suicide crisis helpline or text a crisis text line. This information is not intended to replace advice given to you by your health care provider. Make sure you discuss any questions you have with your health care provider. Document Revised: 09/02/2020 Document Reviewed: 12/19/2018 Elsevier Patient  Education  [place], Adult After being diagnosed with anxiety, you may be relieved to know why you have felt or behaved a certain way.  You may also feel overwhelmed about the treatment ahead and what it will mean for your life. With care and support, you can manage this condition. How to manage lifestyle changes Managing stress and anxiety  Stress is your body's reaction to life changes and events, both good and bad. Most stress will last just a few hours, but stress can be ongoing and can lead to more than just stress. Although stress can play a major role in anxiety, it is not the same as anxiety. Stress is usually caused by something external, such as a deadline, test, or competition. Stress normally passes after the triggering event has ended.  Anxiety is caused by something internal, such as imagining a terrible outcome or worrying that something will go wrong that will devastate you. Anxiety often does not go away even after the triggering event is over, and it can become long-term (chronic) worry. It is important to understand the differences between stress and anxiety and to manage your stress effectively so that it does not lead to an anxious response. Talk with your health care provider or a counselor to learn more about reducing anxiety and stress. He or she may suggest tension reduction techniques, such as: Music therapy. Spend time creating or listening to music that you enjoy and that inspires you. Mindfulness-based meditation. Practice being aware of your normal breaths while not trying to control your breathing. It can be done while sitting or walking. Centering prayer. This involves focusing on a word, phrase, or sacred image that means something to you and brings you peace. Deep breathing. To do this, expand your stomach and inhale slowly through your nose. Hold your breath for 3-5 seconds. Then exhale slowly, letting your stomach muscles relax. Self-talk.  Learn to notice and identify thought patterns that lead to anxiety reactions and change those patterns to thoughts that feel peaceful. Muscle relaxation. Taking time to tense muscles and then relax them. Choose a tension reduction technique that fits your lifestyle and personality. These techniques take time and practice. Set aside 5-15 minutes a day to do them. Therapists can offer counseling and training in these techniques. The training to help with anxiety may be covered by some insurance plans. Other things you can do to manage stress and anxiety include: Keeping a stress diary. This can help you learn what triggers your reaction and then learn ways to manage your response. Thinking about how you react to certain situations. You may not be able to control everything, but you can control your response. Making time for activities that help you relax and not feeling guilty about spending your time in this way. Doing visual imagery. This involves imagining or creating mental pictures to help you relax. Practicing yoga. Through yoga poses, you can lower tension and promote relaxation.  Medicines Medicines can help ease symptoms. Medicines for anxiety include: Antidepressant medicines. These are usually prescribed for long-term daily control. Anti-anxiety medicines. These may be added in severe cases, especially when panic attacks occur. Medicines will be prescribed by a health care provider. When used together, medicines, psychotherapy, and tension reduction techniques may be the most effective treatment. Relationships Relationships can play a big part in helping you recover. Try to spend more time connecting with trusted friends and family members. Consider going to couples counseling if you have a partner, taking family education classes, or going to family therapy. Therapy can help you and others better understand your condition. How to recognize changes in your anxiety Everyone responds  differently  to treatment for anxiety. Recovery from anxiety happens when symptoms decrease and stop interfering with your daily activities at home or work. This may mean that you will start to: Have better concentration and focus. Worry will interfere less in your daily thinking. Sleep better. Be less irritable. Have more energy. Have improved memory. It is also important to recognize when your condition is getting worse. Contact your health care provider if your symptoms interfere with home or work and you feel like your condition is not improving. Follow these instructions at home: Activity Exercise. Adults should do the following: Exercise for at least 150 minutes each week. The exercise should increase your heart rate and make you sweat (moderate-intensity exercise). Strengthening exercises at least twice a week. Get the right amount and quality of sleep. Most adults need 7-9 hours of sleep each night. Lifestyle  Eat a healthy diet that includes plenty of vegetables, fruits, whole grains, low-fat dairy products, and lean protein. Do not eat a lot of foods that are high in fats, added sugars, or salt (sodium). Make choices that simplify your life. Do not use any products that contain nicotine or tobacco. These products include cigarettes, chewing tobacco, and vaping devices, such as e-cigarettes. If you need help quitting, ask your health care provider. Avoid caffeine, alcohol, and certain over-the-counter cold medicines. These may make you feel worse. Ask your pharmacist which medicines to avoid. General instructions Take over-the-counter and prescription medicines only as told by your health care provider. Keep all follow-up visits. This is important. Where to find support You can get help and support from these sources: Self-help groups. Online and [employer]. A trusted spiritual leader. Couples counseling. Family education classes. Family therapy. Where to find more  information You may find that joining a support group helps you deal with your anxiety. The following sources can help you locate counselors or support groups near you: [place]: www.mentalhealthamerica.net Anxiety and Depression Association of [place] (ADAA): https://www.clark.net/ National Alliance on Mental Illness (NAMI): www.nami.org Contact a health care provider if: You have a hard time staying focused or finishing daily tasks. You spend many hours a day feeling worried about everyday life. You become exhausted by worry. You start to have headaches or frequently feel tense. You develop chronic nausea or diarrhea. Get help right away if: You have a racing heart and shortness of breath. You have thoughts of hurting yourself or others. If you ever feel like you may hurt yourself or others, or have thoughts about taking your own life, get help right away. Go to your nearest emergency department or: Call your local emergency services (911 in the [place] Call a suicide crisis helpline, such as the [facility] at 601 600 8984 or 988 in the [place]. This is open 24 hours a day in the [place] Text the Crisis Text Line at (562)330-1785 (in the [place].). Summary Taking steps to learn and use tension reduction techniques can help calm you and help prevent triggering an anxiety reaction. When used together, medicines, psychotherapy, and tension reduction techniques may be the most effective treatment. Family, friends, and partners can play a big part in supporting you. This information is not intended to replace advice given to you by your health care provider. Make sure you discuss any questions you have with your health care provider. Document Revised: 09/02/2020 Document Reviewed: 05/31/2020 Elsevier Patient Education  [place].

## 2021-11-18 NOTE — Progress Notes (Signed)
New Patient Office Visit  Subjective    Patient ID: Mindy Young, female    DOB: 1945-10-27  Age: 76 y.o. MRN: [id number]  CC:  Chief Complaint  Patient presents with   Depression   Anxiety   Alcohol Problem   New Patient (Initial Visit)    HPI Mindy Young presents to establish care. She is accompanied by her adult daughter, Anderson [place].  Patient was seen at [facility] and she would like to be seen her because relatives come in the office. She mentioned have some depression, anxiety and insomnia.   Pt had impaired gait, slurred speech, and altered mental status. Daughter states pt has been drinking alcohol heavily today. States she is addicted to alcohol. Pt lying in fetal position on exam table with eyes closed. Pt arouses briefly to verbal stimuli. Strong odor of alcohol present. Pt's daughter states she will bring her back for early morning appt prior to alcohol ingestion.      Outpatient Encounter Medications as of 11/18/2021  Medication Sig   acamprosate (CAMPRAL) 333 MG tablet Take 666 mg by mouth 3 (three) times daily.   albuterol (VENTOLIN HFA) 108 (90 Base) MCG/ACT inhaler Inhale 1-2 puffs into the lungs every 4 (four) hours as needed for wheezing or shortness of breath.   busPIRone (BUSPAR) 15 MG tablet Take 15 mg by mouth 3 (three) times daily.   cyanocobalamin (VITAMIN B12) 1000 MCG tablet Take by mouth.   diphenhydrAMINE (BENADRYL) 25 MG tablet Take 1 tablet by mouth at bedtime.   diphenoxylate-atropine (LOMOTIL) 2.5-0.025 MG tablet Take 1 tablet by mouth 4 (four) times daily as needed for diarrhea or loose stools.   fluticasone-salmeterol (ADVAIR) 250-50 MCG/ACT AEPB Inhale 1 puff into the lungs 2 (two) times daily as needed (shortness of breath/wheezing).   folic acid (FOLVITE) 1 MG tablet Take 1 tablet (1 mg total) by mouth daily.   gabapentin (NEURONTIN) 100 MG capsule Take 100 mg by mouth 3 (three) times daily as needed for pain.   HYDROmorphone (DILAUDID) 2 MG  tablet Take 1 tablet (2 mg total) by mouth every 6 (six) hours as needed for severe pain.   hydrOXYzine (ATARAX) 25 MG tablet Take 25 mg by mouth 3 (three) times daily.   INCRUSE ELLIPTA 62.5 MCG/ACT AEPB Inhale 1 puff into the lungs daily.   levETIRAcetam (KEPPRA) 1000 MG tablet Take 1 tablet (1,000 mg total) by mouth 2 (two) times daily.   losartan (COZAAR) 100 MG tablet Take 1 tablet (100 mg total) by mouth daily.   magnesium oxide (MAG-OX) 400 (240 Mg) MG tablet Take 2 tablets (800 mg total) by mouth 2 (two) times daily.   Melatonin 10 MG TABS Take by mouth.   metoprolol succinate (TOPROL-XL) 25 MG 24 hr tablet Take 25 mg by mouth daily.   mupirocin ointment (BACTROBAN) 2 % Apply 1 application topically daily.   ondansetron (ZOFRAN) 4 MG tablet Take 4 mg by mouth every 8 (eight) hours as needed for nausea or vomiting.   oxybutynin (DITROPAN-XL) 10 MG 24 hr tablet Take 1 tablet by mouth daily.   QUEtiapine (SEROQUEL) 25 MG tablet Take 0.5 tablets (12.5 mg total) by mouth at bedtime.   sodium chloride 1 g tablet Take 1 tablet (1 g total) by mouth 2 (two) times daily with a meal.   thiamine 100 MG tablet Take 1 tablet (100 mg total) by mouth daily.   traZODone (DESYREL) 50 MG tablet Take 50 mg by mouth at bedtime.   [  DISCONTINUED] amoxicillin-clavulanate (AUGMENTIN) 875-125 MG tablet Take 1 tablet by mouth every 12 (twelve) hours.   [DISCONTINUED] Cyanocobalamin 3000 MCG CAPS Take 3,000 mcg by mouth every evening.   [DISCONTINUED] LORazepam (ATIVAN) 1 MG tablet Take 1 tablet (1 mg total) by mouth 3 (three) times daily as needed for anxiety.   [DISCONTINUED] magnesium oxide (MAG-OX) 400 MG tablet Take by mouth.   [DISCONTINUED] Melatonin 10 MG TABS Take 10 mg by mouth at bedtime.   [DISCONTINUED] metoprolol succinate (TOPROL-XL) 50 MG 24 hr tablet Take 1 tablet (50 mg total) by mouth daily. Take with or immediately following a meal. (Patient taking differently: Take 50 mg by mouth daily.)    [DISCONTINUED] oxybutynin (DITROPAN-XL) 10 MG 24 hr tablet Take 10 mg by mouth every morning.   No facility-administered encounter medications on file as of 11/18/2021.    Past Medical History:  Diagnosis Date   Alcohol abuse    Allergy    Anemia 06/29/2011   Anxiety    Arthritis    Breast cancer ([place])    right breast   C1 cervical fracture ([place]) 02/1997   Cancer ([place])    melanoma   Depression    Family history of breast cancer    Family history of prostate cancer    GERD (gastroesophageal reflux disease)    Hypertension    Tardive dyskinesia     Past Surgical History:  Procedure Laterality Date   ABDOMINAL HYSTERECTOMY     ASPIRATION OF ABSCESS Right 11/20/2017   Procedure: ASPIRATION OF RIGHT AXILLARY SEROMA;  Surgeon: Rolm Bookbinder, MD;  Location: [place];  Service: General;  Laterality: Right;   AUGMENTATION MAMMAPLASTY Bilateral    biateral implants , approx 2015   BREAST LUMPECTOMY Right 11/02/2017   re-ex 11-20-17   BREAST LUMPECTOMY WITH RADIOACTIVE SEED AND SENTINEL LYMPH NODE BIOPSY Right 11/02/2017   Procedure: BREAST LUMPECTOMY WITH RADIOACTIVE SEED AND SENTINEL LYMPH NODE BIOPSY;  Surgeon: Rolm Bookbinder, MD;  Location: [place];  Service: General;  Laterality: Right;   BRONCHIAL WASHINGS  02/21/2021   Procedure: BRONCHIAL WASHINGS;  Surgeon: Candee Furbish, MD;  Location: [facility] ENDOSCOPY;  Service: Pulmonary;;   CHOLECYSTECTOMY     collarbone     INCONTINENCE SURGERY     KNEE SURGERY     removal of cyst, repair of cartiledge   LAPAROSCOPY     for endometriosis   RE-EXCISION OF BREAST LUMPECTOMY Right 11/20/2017   Procedure: RE-EXCISION OF RIGHT BREAST MARGINS;  Surgeon: Rolm Bookbinder, MD;  Location: [place];  Service: General;  Laterality: Right;   ROTATOR CUFF REPAIR     left   VIDEO BRONCHOSCOPY Right 02/21/2021   Procedure: VIDEO BRONCHOSCOPY WITHOUT FLUORO;  Surgeon: Candee Furbish, MD;   Location: [facility] ENDOSCOPY;  Service: Pulmonary;  Laterality: Right;    Family History  Problem Relation Age of Onset   COPD Mother    Prostate cancer Father 58       seed implant for treatment   Colon polyps Father        'a few'   Breast cancer Sister 11   Breast cancer Maternal Aunt        dx >50   Breast cancer Other 83       bilateral   Colon cancer Neg Hx     Social History   Socioeconomic History   Marital status: Married    Spouse name: Not on file   Number of children:  2   Years of education: Not on file   Highest education level: Not on file  Occupational History   Not on file  Tobacco Use   Smoking status: Every Day    Packs/day: 1.00    Types: Cigarettes   Smokeless tobacco: Never   Tobacco comments:    e-cigs  Vaping Use   Vaping Use: Former  Substance and Sexual Activity   Alcohol use: Yes    Alcohol/week: 21.0 standard drinks of alcohol    Types: 21 Standard drinks or equivalent per week    Comment: 2 per weekend day; denies during the week (not consistent with prior hx)   Drug use: No   Sexual activity: Not on file  Other Topics Concern   Not on file  Social History Narrative   Right Handed    Lives in a one story home   Social Determinants of Health   Financial Resource Strain: Not on file  Food Insecurity: Not on file  Transportation Needs: No Transportation Needs (12/05/2017)   PRAPARE - Hydrologist (Medical): No    Lack of Transportation (Non-Medical): No  Physical Activity: Not on file  Stress: Not on file  Social Connections: Not on file  Intimate Partner Violence: Not At Risk (12/05/2017)   Humiliation, Afraid, Rape, and Kick questionnaire    Fear of Current or Ex-Partner: No    Emotionally Abused: No    Physically Abused: No    Sexually Abused: No    Review of Systems  Unable to perform ROS: Mental status change        Objective    BP 110/80   Pulse 87   Temp (!) 97.5 F (36.4 C)   Resp  14   Ht '5\' 2"'$  (1.575 m)   Wt 123 lb (55.8 kg)   SpO2 95%   BMI 22.50 kg/m   Physical Exam Vitals reviewed.  Cardiovascular:     Pulses: Normal pulses.     Heart sounds: Normal heart sounds.  Pulmonary:     Effort: Pulmonary effort is normal.     Breath sounds: Normal breath sounds.  Skin:    General: Skin is warm and dry.     Capillary Refill: Capillary refill takes less than 2 seconds.  Neurological:     Mental Status: She is disoriented.  Psychiatric:     Comments: Pt under the influence of alcohol, slurred speech, altered gait         Assessment & Plan:   1. Alcohol use disorder, severe, dependence (HCC) - CBC with Differential/Platelet - Comprehensive metabolic panel - Lipid panel - T4, free - TSH - VITAMIN D 25 Hydroxy (Vit-D Deficiency, Fractures) - B12 and Folate Panel - Iron, TIBC and Ferritin Panel - Ammonia  2. GAD (generalized anxiety disorder) - hydrOXYzine (ATARAX) 25 MG tablet; Take 25 mg by mouth 3 (three) times daily. - T4, free - TSH - busPIRone (BUSPAR) 10 MG tablet; Take 1 tablet (10 mg total) by mouth 2 (two) times daily.  Dispense: 60 tablet; Refill: 1  3. Depression, recurrent ([place]) - busPIRone (BUSPAR) 10 MG tablet; Take 1 tablet (10 mg total) by mouth 2 (two) times daily.  Dispense: 60 tablet; Refill: 1  4. Acute alcoholic intoxication in alcoholism with complication (HCC) - Ammonia  5. Encounter for medical examination to establish care - CBC with Differential/Platelet - Comprehensive metabolic panel - Lipid panel - T4, free - TSH  Pt had impaired gait, slurred speech, and altered mental status. Daughter states pt has been drinking alcohol heavily today. States she is addicted to alcohol. Pt lying in fetal position on exam table with eyes closed. Pt arouses briefly to verbal stimuli. Strong odor of alcohol present. Pt's daughter states she will bring her back for early morning appt prior to alcohol ingestion.

## 2021-11-19 LAB — COMPREHENSIVE METABOLIC PANEL
ALT: 49 IU/L — ABNORMAL HIGH (ref 0–32)
AST: 57 IU/L — ABNORMAL HIGH (ref 0–40)
Albumin/Globulin Ratio: 1.7 (ref 1.2–2.2)
Albumin: 4.3 g/dL (ref 3.8–4.8)
Alkaline Phosphatase: 107 IU/L (ref 44–121)
BUN/Creatinine Ratio: 12 (ref 12–28)
BUN: 14 mg/dL (ref 8–27)
Bilirubin Total: 0.2 mg/dL (ref 0.0–1.2)
CO2: 25 mmol/L (ref 20–29)
Calcium: 9.5 mg/dL (ref 8.7–10.3)
Chloride: 94 mmol/L — ABNORMAL LOW (ref 96–106)
Creatinine, Ser: 1.13 mg/dL — ABNORMAL HIGH (ref 0.57–1.00)
Globulin, Total: 2.6 g/dL (ref 1.5–4.5)
Glucose: 108 mg/dL — ABNORMAL HIGH (ref 70–99)
Potassium: 4.3 mmol/L (ref 3.5–5.2)
Sodium: 138 mmol/L (ref 134–144)
Total Protein: 6.9 g/dL (ref 6.0–8.5)
eGFR: 50 mL/min/{1.73_m2} — ABNORMAL LOW (ref >59)

## 2021-11-19 LAB — TSH: TSH: 0.514 u[IU]/mL (ref 0.450–4.500)

## 2021-11-19 LAB — IRON,TIBC AND FERRITIN PANEL
Ferritin: 90 ng/mL (ref 15–150)
Iron Saturation: 40 % (ref 15–55)
Iron: 131 ug/dL (ref 27–139)
Total Iron Binding Capacity: 330 ug/dL (ref 250–450)
UIBC: 199 ug/dL (ref 118–369)

## 2021-11-19 LAB — CBC WITH DIFFERENTIAL/PLATELET
Basophils Absolute: 0.1 10*3/uL (ref 0.0–0.2)
Basos: 1 %
EOS (ABSOLUTE): 0 10*3/uL (ref 0.0–0.4)
Eos: 0 %
Hematocrit: 42.1 % (ref 34.0–46.6)
Hemoglobin: 14.1 g/dL (ref 11.1–15.9)
Immature Grans (Abs): 0.1 10*3/uL (ref 0.0–0.1)
Immature Granulocytes: 1 %
Lymphocytes Absolute: 2.2 10*3/uL (ref 0.7–3.1)
Lymphs: 18 %
MCH: 32.8 pg (ref 26.6–33.0)
MCHC: 33.5 g/dL (ref 31.5–35.7)
MCV: 98 fL — ABNORMAL HIGH (ref 79–97)
Monocytes Absolute: 1 10*3/uL — ABNORMAL HIGH (ref 0.1–0.9)
Monocytes: 8 %
Neutrophils Absolute: 8.8 10*3/uL — ABNORMAL HIGH (ref 1.4–7.0)
Neutrophils: 72 %
Platelets: 376 10*3/uL (ref 150–450)
RBC: 4.3 x10E6/uL (ref 3.77–5.28)
RDW: 12.9 % (ref 11.7–15.4)
WBC: 12.1 10*3/uL — ABNORMAL HIGH (ref 3.4–10.8)

## 2021-11-19 LAB — LIPID PANEL
Chol/HDL Ratio: 2.7 ratio (ref 0.0–4.4)
Cholesterol, Total: 247 mg/dL — ABNORMAL HIGH (ref 100–199)
HDL: 90 mg/dL (ref >39)
LDL Chol Calc (NIH): 116 mg/dL — ABNORMAL HIGH (ref 0–99)
Triglycerides: 240 mg/dL — ABNORMAL HIGH (ref 0–149)
VLDL Cholesterol Cal: 41 mg/dL — ABNORMAL HIGH (ref 5–40)

## 2021-11-19 LAB — AMMONIA: Ammonia: 41 ug/dL (ref 31–169)

## 2021-11-19 LAB — VITAMIN D 25 HYDROXY (VIT D DEFICIENCY, FRACTURES): Vit D, 25-Hydroxy: 30 ng/mL (ref 30.0–100.0)

## 2021-11-19 LAB — T4, FREE: Free T4: 1.39 ng/dL (ref 0.82–1.77)

## 2021-11-19 LAB — B12 AND FOLATE PANEL
Folate: 13.3 ng/mL (ref >3.0)
Vitamin B-12: 1725 pg/mL — ABNORMAL HIGH (ref 232–1245)

## 2021-11-19 LAB — CARDIOVASCULAR RISK ASSESSMENT

## 2021-11-25 ENCOUNTER — Ambulatory Visit (INDEPENDENT_AMBULATORY_CARE_PROVIDER_SITE_OTHER): Payer: Medicare Other | Admitting: Nurse Practitioner

## 2021-11-25 ENCOUNTER — Other Ambulatory Visit: Payer: Self-pay | Admitting: Nurse Practitioner

## 2021-11-25 ENCOUNTER — Encounter: Payer: Self-pay | Admitting: Nurse Practitioner

## 2021-11-25 VITALS — BP 122/72 | HR 101 | Temp 97.6°F | Ht 62.0 in | Wt 123.0 lb

## 2021-11-25 DIAGNOSIS — F411 Generalized anxiety disorder: Secondary | ICD-10-CM

## 2021-11-25 DIAGNOSIS — F418 Other specified anxiety disorders: Secondary | ICD-10-CM

## 2021-11-25 DIAGNOSIS — G40909 Epilepsy, unspecified, not intractable, without status epilepticus: Secondary | ICD-10-CM

## 2021-11-25 DIAGNOSIS — F1029 Alcohol dependence with unspecified alcohol-induced disorder: Secondary | ICD-10-CM

## 2021-11-25 DIAGNOSIS — J449 Chronic obstructive pulmonary disease, unspecified: Secondary | ICD-10-CM

## 2021-11-25 DIAGNOSIS — R112 Nausea with vomiting, unspecified: Secondary | ICD-10-CM

## 2021-11-25 MED ORDER — ONDANSETRON 4 MG PO TBDP: 4.0000 mg | ORAL_TABLET | Freq: Three times a day (TID) | ORAL | 0 refills | Status: AC | PRN

## 2021-11-25 MED ORDER — HYDROXYZINE HCL 25 MG PO TABS: 25.0000 mg | ORAL_TABLET | Freq: Three times a day (TID) | ORAL | 1 refills | Status: AC

## 2021-11-25 MED ORDER — GABAPENTIN 300 MG PO CAPS: 300.0000 mg | ORAL_CAPSULE | Freq: Three times a day (TID) | ORAL | 1 refills | Status: DC

## 2021-11-25 NOTE — Progress Notes (Signed)
Subjective:  Patient ID: Mindy Young, female    DOB: 04-25-1945  Age: 76 y.o. MRN: [id number]  CC: Establish care Alcohol dependence  HPI   Pt presents to establish care. Pt was intoxicated at her initial visit 1-week ago on 11/18/21. She has returned today with her adult daughter. States she has not ingested any alcohol today. Past history of lung and breast cancer, seizure disorder, and hypertension. She has a past mental health history of anxiety, depression, alcohol dependence, and cigarette smoking. She is not currently in counseling. Pt states she wants to stop drinking alcohol to improve her overall health. Pt has experienced multiple injuries secondary to falls including a subdural hematoma 04/16/21. Head CT s/p fall on 08/05/21 revealed cerebral atrophy and chronic small vessel disease. Not currently prescribed ASA or statin therapy.     GAD-7 Results    11/25/2021    9:38 AM  GAD-7 Generalized Anxiety Disorder Screening Tool  1. Feeling Nervous, Anxious, or on Edge 3  2. Not Being Able to Stop or Control Worrying 2  3. Worrying Too Much About Different Things 1  4. Trouble Relaxing 3  5. Being So Restless it's Hard To Sit Still 3  6. Becoming Easily Annoyed or Irritable 2  7. Feeling Afraid As If Something Awful Might Happen 0  Total GAD-7 Score 14  Difficulty At Work, Home, or Getting  Along With Others? Somewhat difficult         11/25/2021    9:35 AM 12/27/2020   12:48 PM  Depression screen PHQ 2/9  Decreased Interest 3 1  Down, Depressed, Hopeless 1 3  PHQ - 2 Score 4 4  Altered sleeping 3 3  Tired, decreased energy 3 2  Change in appetite 3 0  Feeling bad or failure about yourself  3 3  Trouble concentrating 0 1  Moving slowly or fidgety/restless 3 0  Suicidal thoughts 0 0  PHQ-9 Score 19 13  Difficult doing work/chores Not difficult at all Somewhat difficult     Current Outpatient Medications on File Prior to Visit  Medication Sig Dispense Refill    acamprosate (CAMPRAL) 333 MG tablet Take 666 mg by mouth 3 (three) times daily.     albuterol (VENTOLIN HFA) 108 (90 Base) MCG/ACT inhaler Inhale 1-2 puffs into the lungs every 4 (four) hours as needed for wheezing or shortness of breath.     busPIRone (BUSPAR) 10 MG tablet Take 1 tablet (10 mg total) by mouth 2 (two) times daily. 60 tablet 1   cyanocobalamin (VITAMIN B12) 1000 MCG tablet Take by mouth.     diphenhydrAMINE (BENADRYL) 25 MG tablet Take 1 tablet by mouth at bedtime.     diphenoxylate-atropine (LOMOTIL) 2.5-0.025 MG tablet Take 1 tablet by mouth 4 (four) times daily as needed for diarrhea or loose stools. 30 tablet 0   fluticasone-salmeterol (ADVAIR) 250-50 MCG/ACT AEPB Inhale 1 puff into the lungs 2 (two) times daily as needed (shortness of breath/wheezing).     folic acid (FOLVITE) 1 MG tablet Take 1 tablet (1 mg total) by mouth daily. 30 tablet 0   gabapentin (NEURONTIN) 100 MG capsule Take 100 mg by mouth 3 (three) times daily as needed for pain.     HYDROmorphone (DILAUDID) 2 MG tablet Take 1 tablet (2 mg total) by mouth every 6 (six) hours as needed for severe pain. 14 tablet 0   hydrOXYzine (ATARAX) 25 MG tablet Take 25 mg by mouth 3 (three) times daily.  INCRUSE ELLIPTA 62.5 MCG/ACT AEPB Inhale 1 puff into the lungs daily.     levETIRAcetam (KEPPRA) 1000 MG tablet Take 1 tablet (1,000 mg total) by mouth 2 (two) times daily. 60 tablet 11   losartan (COZAAR) 100 MG tablet Take 1 tablet (100 mg total) by mouth daily.     magnesium oxide (MAG-OX) 400 (240 Mg) MG tablet Take 2 tablets (800 mg total) by mouth 2 (two) times daily.     Melatonin 10 MG TABS Take by mouth.     metoprolol succinate (TOPROL-XL) 25 MG 24 hr tablet Take 25 mg by mouth daily.     mupirocin ointment (BACTROBAN) 2 % Apply 1 application topically daily.     ondansetron (ZOFRAN) 4 MG tablet Take 4 mg by mouth every 8 (eight) hours as needed for nausea or vomiting.     oxybutynin (DITROPAN-XL) 10 MG 24 hr  tablet Take 1 tablet by mouth daily.     QUEtiapine (SEROQUEL) 25 MG tablet Take 0.5 tablets (12.5 mg total) by mouth at bedtime.     sodium chloride 1 g tablet Take 1 tablet (1 g total) by mouth 2 (two) times daily with a meal.     thiamine 100 MG tablet Take 1 tablet (100 mg total) by mouth daily. 30 tablet 0   traZODone (DESYREL) 50 MG tablet Take 50 mg by mouth at bedtime.     No current facility-administered medications on file prior to visit.   Past Medical History:  Diagnosis Date   Alcohol abuse    Allergy    Anemia 06/29/2011   Anxiety    Arthritis    Breast cancer ([place])    right breast   C1 cervical fracture ([place]) 02/1997   Cancer ([place])    melanoma   Depression    Family history of breast cancer    Family history of prostate cancer    GERD (gastroesophageal reflux disease)    Hypertension    Tardive dyskinesia    Past Surgical History:  Procedure Laterality Date   ABDOMINAL HYSTERECTOMY     ASPIRATION OF ABSCESS Right 11/20/2017   Procedure: ASPIRATION OF RIGHT AXILLARY SEROMA;  Surgeon: Rolm Bookbinder, MD;  Location: [place];  Service: General;  Laterality: Right;   AUGMENTATION MAMMAPLASTY Bilateral    biateral implants , approx 2015   BREAST LUMPECTOMY Right 11/02/2017   re-ex 11-20-17   BREAST LUMPECTOMY WITH RADIOACTIVE SEED AND SENTINEL LYMPH NODE BIOPSY Right 11/02/2017   Procedure: BREAST LUMPECTOMY WITH RADIOACTIVE SEED AND SENTINEL LYMPH NODE BIOPSY;  Surgeon: Rolm Bookbinder, MD;  Location: [place];  Service: General;  Laterality: Right;   BRONCHIAL WASHINGS  02/21/2021   Procedure: BRONCHIAL WASHINGS;  Surgeon: Candee Furbish, MD;  Location: [facility] ENDOSCOPY;  Service: Pulmonary;;   CHOLECYSTECTOMY     collarbone     INCONTINENCE SURGERY     KNEE SURGERY     removal of cyst, repair of cartiledge   LAPAROSCOPY     for endometriosis   RE-EXCISION OF BREAST LUMPECTOMY Right 11/20/2017   Procedure: RE-EXCISION OF RIGHT  BREAST MARGINS;  Surgeon: Rolm Bookbinder, MD;  Location: [place];  Service: General;  Laterality: Right;   ROTATOR CUFF REPAIR     left   VIDEO BRONCHOSCOPY Right 02/21/2021   Procedure: VIDEO BRONCHOSCOPY WITHOUT FLUORO;  Surgeon: Candee Furbish, MD;  Location: [facility] ENDOSCOPY;  Service: Pulmonary;  Laterality: Right;    Family History  Problem Relation Age  of Onset   COPD Mother    Prostate cancer Father 66       seed implant for treatment   Colon polyps Father        'a few'   Breast cancer Sister 16   Breast cancer Maternal Aunt        dx >50   Breast cancer Other 27       bilateral   Colon cancer Neg Hx    Social History   Socioeconomic History   Marital status: Married    Spouse name: Not on file   Number of children: 2   Years of education: Not on file   Highest education level: Not on file  Occupational History   Not on file  Tobacco Use   Smoking status: Every Day    Packs/day: 2.00    Types: Cigarettes   Smokeless tobacco: Never   Tobacco comments:    e-cigs  Vaping Use   Vaping Use: Former  Substance and Sexual Activity   Alcohol use: Yes    Alcohol/week: 5.0 standard drinks of alcohol    Types: 5 Standard drinks or equivalent per week    Comment: per day.   Drug use: No   Sexual activity: Not Currently  Other Topics Concern   Not on file  Social History Narrative   Right Handed    Lives in a one story home   Social Determinants of Health   Financial Resource Strain: Not on file  Food Insecurity: Not on file  Transportation Needs: No Transportation Needs (12/05/2017)   PRAPARE - Hydrologist (Medical): No    Lack of Transportation (Non-Medical): No  Physical Activity: Not on file  Stress: Not on file  Social Connections: Not on file    Review of Systems  Constitutional:  Positive for diaphoresis (night sweats). Negative for chills, fatigue and fever.  HENT:  Negative for congestion, ear pain,  rhinorrhea and sore throat.   Respiratory:  Negative for cough and shortness of breath.   Cardiovascular:  Negative for chest pain.  Gastrointestinal:  Negative for abdominal pain, constipation, diarrhea, nausea and vomiting.  Genitourinary:  Negative for dysuria and urgency.  Musculoskeletal:  Positive for arthralgias. Negative for back pain and myalgias.  Neurological:  Negative for dizziness, weakness, light-headedness and headaches.  Psychiatric/Behavioral:  Negative for dysphoric mood. The patient is not nervous/anxious.      Objective:  BP 122/72   Pulse (!) 101   Temp 97.6 F (36.4 C)   Ht '5\' 2"'$  (1.575 m)   Wt 123 lb (55.8 kg)   SpO2 94%   BMI 22.50 kg/m       11/18/2021    2:25 PM 08/06/2021    4:09 AM 08/06/2021    2:30 AM  BP/Weight  Systolic BP 315 176 160  Diastolic BP 80 80 74  Wt. (Lbs) 123    BMI 22.5 kg/m2      Physical Exam Vitals reviewed.  Constitutional:      Appearance: Normal appearance.  HENT:     Head: Normocephalic.     Right Ear: Tympanic membrane normal.     Left Ear: Tympanic membrane normal.     Nose: Nose normal.     Mouth/Throat:     Mouth: Mucous membranes are moist.  Eyes:     Pupils: Pupils are equal, round, and reactive to light.  Cardiovascular:     Rate and Rhythm: Normal rate and regular rhythm.  Pulmonary:     Effort: Pulmonary effort is normal.     Breath sounds: Normal breath sounds.  Abdominal:     General: Bowel sounds are normal.     Palpations: Abdomen is soft.  Musculoskeletal:        General: Normal range of motion.  Skin:    General: Skin is warm and dry.     Capillary Refill: Capillary refill takes less than 2 seconds.  Neurological:     General: No focal deficit present.     Mental Status: She is alert and oriented to person, place, and time.  Psychiatric:        Mood and Affect: Mood normal.        Behavior: Behavior normal.    Lab Results  Component Value Date   WBC 12.1 (H) 11/18/2021   HGB 14.1  11/18/2021   HCT 42.1 11/18/2021   PLT 376 11/18/2021   GLUCOSE 108 (H) 11/18/2021   CHOL 247 (H) 11/18/2021   TRIG 240 (H) 11/18/2021   HDL 90 11/18/2021   LDLCALC 116 (H) 11/18/2021   ALT 49 (H) 11/18/2021   AST 57 (H) 11/18/2021   NA 138 11/18/2021   K 4.3 11/18/2021   CL 94 (L) 11/18/2021   CREATININE 1.13 (H) 11/18/2021   BUN 14 11/18/2021   CO2 25 11/18/2021   TSH 0.514 11/18/2021   INR 0.9 02/19/2021   HGBA1C 6.0 (H) 12/10/2018      Assessment & Plan:   1. 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 300 mg every 6 hours on day 1, then 300 mg every 8 hours on day 2, then 300 mg every 12 hours on day 3, then 300 mg at night on day 4. In addition to scheduled doses, provide one additional as-needed 300 mg dose per day for breakthrough withdrawal symptom  Dispense: 30 capsule; Refill: 1  2. Nausea and vomiting, unspecified vomiting type - ondansetron (ZOFRAN-ODT) 4 MG disintegrating tablet; Take 1 tablet (4 mg total) by mouth every 8 (eight) hours as needed for nausea or vomiting.  Dispense: 20 tablet; Refill: 0    Contact [facility] in [place] for assistance for alcohol dependence Seek emergency medical care for any severe withdrawal symptoms Take Gabapentin 300 mg every 6 hours on day 1, then take Gabapentin 300 mg every 8 hours on day 2, then Gabapentin 300 mg every 12 hours on day 3, then Gabapentin 300 mg at bedtime night on day 4 and afterwards, may take Gabapentin 300 mg once daily as needed for breakthrough withdrawal symptoms Take Zofran as needed for nausea  Take Atarax 25 mg as needed for anxiety/insomnia Follow-up in 2 weeks to discuss [facility] genetic testing  Follow-up: 2-weeks  An After Visit Summary was printed and given to the patient.  I, Rip Harbour, NP, have reviewed all documentation for this  visit. The documentation on 11/25/21 for the exam, diagnosis, procedures, and orders are all accurate and complete.   Signed, Rip Harbour, NP [place] [phone]

## 2021-11-25 NOTE — Patient Instructions (Addendum)
Contact [facility] in [place] for assistance for alcohol dependence Seek emergency medical care for any severe withdrawal symptoms Take Gabapentin 300 mg every 6 hours on day 1, then take Gabapentin 300 mg every 8 hours on day 2, then Gabapentin 300 mg every 12 hours on day 3, then Gabapentin 300 mg at bedtime night on day 4 and afterwards, may take Gabapentin 300 mg once daily as needed for breakthrough withdrawal symptoms Take Zofran as needed for nausea  Take Atarax 25 mg as needed for anxiety/insomnia Follow-up in 2 weeks to discuss [facility] genetic testing     Alcohol Withdrawal Syndrome Alcohol withdrawal syndrome is a group of symptoms that can happen when a person who drinks heavily and regularly stops drinking or drinks less. This condition may be mild or severe. It can also be life-threatening. What are the causes? Alcohol withdrawal syndrome happens when a person who has been drinking a lot of alcohol for a long time stops drinking. What increases the risk? You are more likely to get alcohol withdrawal syndrome if: You drink more alcohol than is recommended. The limit is: 0-1 drink a day for women who are not pregnant. 0-2 drinks a day for men. You have used alcohol for a long time. You have had alcohol withdrawal syndrome in the past. You have had a seizure in the past when you stopped drinking. You are an older person. You use drugs. You have long-term (chronic) disease, such as heart or lung problems. You have depression. You are not eating well. What are the signs or symptoms? Symptoms of this condition include: Shaking. Sweating. Headache. Alcohol cravings. Feeling fearful, upset, grouchy, or depressed. Trouble sleeping or nightmares. Not being hungry (loss of appetite). Moderate symptoms of this condition include: Big changes in mood (mood swings). Trouble thinking clearly. Being bothered by light and sounds. Fast or uneven heartbeats  (palpitations). Vomiting. Some symptoms are serious and must be treated right away. These symptoms are called delirium tremens, or DTs. Get help right away if you have symptoms of DTs. Symptoms include: High blood pressure. Fast heartbeat. Trouble breathing. Seizures. Seeing, hearing, feeling, smelling, or tasting things that are not there (hallucinations). How is this treated? Treatment may involve: Checking your blood pressure, pulse, and breathing. IV fluids to keep you hydrated. Medicines to treat your symptoms. Medicine to reduce anxiety. Medicine to prevent or control seizures. Multivitamins and B vitamins. Having a doctor check on you daily. If you need help to stop drinking, your doctor may recommend: Medicines. Counseling. Support groups. Follow these instructions at home:  Take over-the-counter and prescription medicines only as told by your doctor. Do not drink alcohol. Do not drive until your doctor says that it is safe. Have someone stay with you or be available in case you need help. This should be someone you trust. This person can help you with your symptoms and can also help you to not drink. Drink enough fluid to keep your pee (urine) pale yellow. Think about joining a support group or a treatment program to help you stop drinking. Contact a doctor if: Your symptoms get worse. You cannot eat or drink without vomiting. You cannot stop drinking alcohol. Get help right away if: You have fast or uneven heartbeats. You have chest pain. You have trouble breathing. You are told you had a seizure. You see, hear, feel, smell, or taste something that is not there. You get very confused. These symptoms may be an emergency. Get help right away. Call  911. Do not wait to see if the symptoms will go away. Do not drive yourself to the hospital. Summary Alcohol withdrawal syndrome is a group of symptoms that can happen when a person who drinks heavily and regularly stops  drinking or drinks less. Delirium tremens (DTs) is a group of life-threatening symptoms. You should get help right away if you have these symptoms. Think about joining an alcohol support group or a treatment program. This information is not intended to replace advice given to you by your health care provider. Make sure you discuss any questions you have with your health care provider. Document Revised: 04/21/2021 Document Reviewed: 04/21/2021 [employer] Patient Education  [place].

## 2021-12-09 ENCOUNTER — Ambulatory Visit: Payer: Medicare Other | Admitting: Nurse Practitioner

## 2021-12-10 ENCOUNTER — Encounter: Payer: Self-pay | Admitting: Nurse Practitioner

## 2021-12-10 ENCOUNTER — Ambulatory Visit: Payer: Medicare Other | Admitting: Nurse Practitioner

## 2022-01-19 ENCOUNTER — Other Ambulatory Visit: Payer: Self-pay | Admitting: Nurse Practitioner

## 2022-01-19 ENCOUNTER — Ambulatory Visit (INDEPENDENT_AMBULATORY_CARE_PROVIDER_SITE_OTHER): Payer: Medicare Other | Admitting: Nurse Practitioner

## 2022-01-19 ENCOUNTER — Encounter: Payer: Self-pay | Admitting: Nurse Practitioner

## 2022-01-19 VITALS — BP 134/80 | HR 101 | Temp 97.2°F | Ht 62.0 in | Wt 128.0 lb

## 2022-01-19 DIAGNOSIS — F10982 Alcohol use, unspecified with alcohol-induced sleep disorder: Secondary | ICD-10-CM | POA: Diagnosis not present

## 2022-01-19 DIAGNOSIS — F1029 Alcohol dependence with unspecified alcohol-induced disorder: Secondary | ICD-10-CM

## 2022-01-19 DIAGNOSIS — F418 Other specified anxiety disorders: Secondary | ICD-10-CM | POA: Diagnosis not present

## 2022-01-19 DIAGNOSIS — F1020 Alcohol dependence, uncomplicated: Secondary | ICD-10-CM

## 2022-01-19 MED ORDER — BUSPIRONE HCL 15 MG PO TABS: 15.0000 mg | ORAL_TABLET | Freq: Two times a day (BID) | ORAL | 0 refills | Status: DC

## 2022-01-19 MED ORDER — TRAZODONE HCL 50 MG PO TABS: 25.0000 mg | ORAL_TABLET | Freq: Every evening | ORAL | 3 refills | Status: AC | PRN

## 2022-01-19 MED ORDER — VORTIOXETINE HBR 5 MG PO TABS: 5.0000 mg | ORAL_TABLET | Freq: Every day | ORAL | 0 refills | Status: AC

## 2022-01-19 MED ORDER — GABAPENTIN 300 MG PO CAPS: 300.0000 mg | ORAL_CAPSULE | Freq: Three times a day (TID) | ORAL | 3 refills | Status: AC

## 2022-01-19 NOTE — Patient Instructions (Signed)
Increase Buspar to 15 mg twice daily for anxiety Resume Gabapentin 300 mg three times daily Begin Trintellix 5 mg once daily for depression Stop medication immediately for any severe side effects Monitor of Serotonin Syndrome We will call you with psychiatry referral appointment Continue to work on decreasing alcohol use Recommend alcohol rehabilitation Follow-up in 2-weeks   Serotonin Syndrome Serotonin is a chemical that helps to control several functions in the body. This chemical is also called a neurotransmitter. It controls: Brain and nerve cell function. Mood and emotions. Memory. Eating. Sleeping. Sexual activity. Stress response. Having too much serotonin in your body can cause serotonin syndrome. This condition can be harmful to your brain and nerve cells. This can be a life-threatening condition. What are the causes? This condition may be caused by taking medicines or drugs that increase the level of serotonin in your body, such as: Antidepressant medicines. Migraine medicines. Certain pain medicines. Certain drugs, including ecstasy, LSD, cocaine, and amphetamines. Over-the-counter cough or cold medicines that contain dextromethorphan. Certain herbal supplements, including St. John's wort, ginseng, and nutmeg. This condition usually occurs when you take these medicines or drugs together, but it can also happen with a high dose of a single medicine or drug. What increases the risk? You are more likely to develop this condition if: You just started taking a medicine or drug that increases the level of serotonin in the body. You recently increased the dose of a medicine or drug that increases the level of serotonin in the body. You take more than one medicine or drug that increases the level of serotonin in the body. What are the signs or symptoms? Symptoms of this condition usually start within several hours of taking a medicine or drug. Symptoms may be mild or severe.  Mild symptoms include: Sweating. Restlessness or agitation. Muscle twitching or stiffness. Rapid heart rate. Nausea, vomiting, or diarrhea. Shivering or goose bumps. Confusion. Severe symptoms include: Irregular heartbeat. Seizures. Loss of consciousness. High fever. How is this diagnosed? This condition may be diagnosed based on: Your medical history. A physical exam. Your prior use of drugs and medicines. Blood or urine tests. These may be used to rule out other causes of your symptoms. How is this treated? The treatment for this condition depends on the severity of your symptoms. For mild cases, stopping the medicine or drug that caused your condition is usually all that is needed. For moderate to severe cases, treatment in a hospital may be needed to prevent or treat life-threatening symptoms. Treatment may include: Medicines to control your symptoms. IV fluids. Actions to support your breathing. Treatments to control your body temperature. Follow these instructions at home: Medicines  Take over-the-counter and prescription medicines only as told by your health care provider. Check with your health care provider before you start taking any new prescriptions, over-the-counter medicines, herbs, or supplements. Do not combine any medicines that can cause this condition. Lifestyle  Maintain a healthy lifestyle. Eat a healthy diet that includes plenty of vegetables, fruits, whole grains, low-fat dairy products, and lean protein. Do not eat a lot of foods that are high in fat, added sugars, or salt. Get the right amount and quality of sleep. Most adults need 7-9 hours of sleep each night. Make time to exercise, even if it is only for short periods of time. Most adults should exercise for at least 150 minutes each week. Do not drink alcohol. Do not use illegal drugs. Do not take medicines for reasons other than they  are prescribed. General instructions Do not use any products  that contain nicotine or tobacco. These products include cigarettes, chewing tobacco, and vaping devices, such as e-cigarettes. If you need help quitting, ask your health care provider. Contact a health care provider if: Your symptoms do not improve or they get worse. Get help right away if: You have worsening confusion, severe headache, chest pain, high fever, seizures, or loss of consciousness. You experience serious side effects of medicine, such as swelling of your face, lips, tongue, or throat. These symptoms may be an emergency. Get help right away. Call 911. Do not wait to see if the symptoms will go away. Do not drive yourself to the hospital. Also, get help right away if: You have serious thoughts about hurting yourself or others. Take one of these steps if you feel like you may hurt yourself or others, or have thoughts about taking your own life: Go to your nearest emergency room. Call 911. Call the [facility] at [phone] or 988. This is open 24 hours a day. Text the Crisis Text Line at 9157783374. Summary Serotonin is a chemical that helps to control several functions in the body. High levels of serotonin in the body can cause serotonin syndrome, which can be life-threatening. This condition may be caused by taking medicines or drugs that increase the level of serotonin in your body. Treatment depends on the severity of your symptoms. For mild cases, stopping the medicine or drug that caused your condition is usually all that is needed. Check with your health care provider before you start taking any new prescriptions, over-the-counter medicines, herbs, or supplements. This information is not intended to replace advice given to you by your health care provider. Make sure you discuss any questions you have with your health care provider. Document Revised: 04/29/2021 Document Reviewed: 04/29/2021 Elsevier Patient Education  [place], Adult After being diagnosed with anxiety, you may be relieved to know why you have felt or behaved a certain way. You may also feel overwhelmed about the treatment ahead and what it will mean for your life. With care and support, you can manage this condition. How to manage lifestyle changes Managing stress and anxiety  Stress is your body's reaction to life changes and events, both good and bad. Most stress will last just a few hours, but stress can be ongoing and can lead to more than just stress. Although stress can play a major role in anxiety, it is not the same as anxiety. Stress is usually caused by something external, such as a deadline, test, or competition. Stress normally passes after the triggering event has ended.  Anxiety is caused by something internal, such as imagining a terrible outcome or worrying that something will go wrong that will devastate you. Anxiety often does not go away even after the triggering event is over, and it can become long-term (chronic) worry. It is important to understand the differences between stress and anxiety and to manage your stress effectively so that it does not lead to an anxious response. Talk with your health care provider or a counselor to learn more about reducing anxiety and stress. He or she may suggest tension reduction techniques, such as: Music therapy. Spend time creating or listening to music that you enjoy and that inspires you. Mindfulness-based meditation. Practice being aware of your normal breaths while not trying to control your breathing. It can be done while sitting or walking. Centering prayer. This involves  focusing on a word, phrase, or sacred image that means something to you and brings you peace. Deep breathing. To do this, expand your stomach and inhale slowly through your nose. Hold your breath for 3-5 seconds. Then exhale slowly, letting your stomach muscles relax. Self-talk. Learn to notice and identify thought  patterns that lead to anxiety reactions and change those patterns to thoughts that feel peaceful. Muscle relaxation. Taking time to tense muscles and then relax them. Choose a tension reduction technique that fits your lifestyle and personality. These techniques take time and practice. Set aside 5-15 minutes a day to do them. Therapists can offer counseling and training in these techniques. The training to help with anxiety may be covered by some insurance plans. Other things you can do to manage stress and anxiety include: Keeping a stress diary. This can help you learn what triggers your reaction and then learn ways to manage your response. Thinking about how you react to certain situations. You may not be able to control everything, but you can control your response. Making time for activities that help you relax and not feeling guilty about spending your time in this way. Doing visual imagery. This involves imagining or creating mental pictures to help you relax. Practicing yoga. Through yoga poses, you can lower tension and promote relaxation.  Medicines Medicines can help ease symptoms. Medicines for anxiety include: Antidepressant medicines. These are usually prescribed for long-term daily control. Anti-anxiety medicines. These may be added in severe cases, especially when panic attacks occur. Medicines will be prescribed by a health care provider. When used together, medicines, psychotherapy, and tension reduction techniques may be the most effective treatment. Relationships Relationships can play a big part in helping you recover. Try to spend more time connecting with trusted friends and family members. Consider going to couples counseling if you have a partner, taking family education classes, or going to family therapy. Therapy can help you and others better understand your condition. How to recognize changes in your anxiety Everyone responds differently to treatment for anxiety.  Recovery from anxiety happens when symptoms decrease and stop interfering with your daily activities at home or work. This may mean that you will start to: Have better concentration and focus. Worry will interfere less in your daily thinking. Sleep better. Be less irritable. Have more energy. Have improved memory. It is also important to recognize when your condition is getting worse. Contact your health care provider if your symptoms interfere with home or work and you feel like your condition is not improving. Follow these instructions at home: Activity Exercise. Adults should do the following: Exercise for at least 150 minutes each week. The exercise should increase your heart rate and make you sweat (moderate-intensity exercise). Strengthening exercises at least twice a week. Get the right amount and quality of sleep. Most adults need 7-9 hours of sleep each night. Lifestyle  Eat a healthy diet that includes plenty of vegetables, fruits, whole grains, low-fat dairy products, and lean protein. Do not eat a lot of foods that are high in fats, added sugars, or salt (sodium). Make choices that simplify your life. Do not use any products that contain nicotine or tobacco. These products include cigarettes, chewing tobacco, and vaping devices, such as e-cigarettes. If you need help quitting, ask your health care provider. Avoid caffeine, alcohol, and certain over-the-counter cold medicines. These may make you feel worse. Ask your pharmacist which medicines to avoid. General instructions Take over-the-counter and prescription medicines only as told by  your health care provider. Keep all follow-up visits. This is important. Where to find support You can get help and support from these sources: Self-help groups. Online and [employer]. A trusted spiritual leader. Couples counseling. Family education classes. Family therapy. Where to find more information You may find that  joining a support group helps you deal with your anxiety. The following sources can help you locate counselors or support groups near you: [place]: www.mentalhealthamerica.net Anxiety and Depression Association of [place] (ADAA): https://www.clark.net/ National Alliance on Mental Illness (NAMI): www.nami.org Contact a health care provider if: You have a hard time staying focused or finishing daily tasks. You spend many hours a day feeling worried about everyday life. You become exhausted by worry. You start to have headaches or frequently feel tense. You develop chronic nausea or diarrhea. Get help right away if: You have a racing heart and shortness of breath. You have thoughts of hurting yourself or others. If you ever feel like you may hurt yourself or others, or have thoughts about taking your own life, get help right away. Go to your nearest emergency department or: Call your local emergency services (911 in the [place] Call a suicide crisis helpline, such as the [facility] at (912)116-1672 or 988 in the [place]. This is open 24 hours a day in the [place] Text the Crisis Text Line at 662-610-5613 (in the [place].). Summary Taking steps to learn and use tension reduction techniques can help calm you and help prevent triggering an anxiety reaction. When used together, medicines, psychotherapy, and tension reduction techniques may be the most effective treatment. Family, friends, and partners can play a big part in supporting you. This information is not intended to replace advice given to you by your health care provider. Make sure you discuss any questions you have with your health care provider. Document Revised: 09/02/2020 Document Reviewed: 05/31/2020 Elsevier Patient Education  [place] Depression, Adult Depression is a mental health condition that affects your thoughts, feelings, and actions. Being diagnosed with depression can bring you relief if  you did not know why you have felt or behaved a certain way. It could also leave you feeling overwhelmed. Finding ways to manage your symptoms can help you feel more positive about your future. How to manage lifestyle changes Being depressed is difficult. Depression can increase the level of everyday stress. Stress can make depression symptoms worse. You may believe your symptoms cannot be managed or will never improve. However, there are many things you can try to help manage your symptoms. There is hope. Managing stress  Stress is your body's reaction to life changes and events, both good and bad. Stress can add to your feelings of depression. Learning to manage your stress can help lessen your feelings of depression. Try some of the following approaches to reducing your stress (stress reduction techniques): Listen to music that you enjoy and that inspires you. Try using a meditation app or take a meditation class. Develop a practice that helps you connect with your spiritual self. Walk in nature, pray, or go to a place of worship. Practice deep breathing. To do this, inhale slowly through your nose. Pause at the top of your inhale for a few seconds and then exhale slowly, letting yourself relax. Repeat this three or four times. Practice yoga to help relax and work your muscles. Choose a stress reduction technique that works for you. These techniques take time and practice to develop. Set aside 5-15  minutes a day to do them. Therapists can offer training in these techniques. Do these things to help manage stress: Keep a journal. Know your limits. Set healthy boundaries for yourself and others, such as saying "no" when you think something is too much. Pay attention to how you react to certain situations. You may not be able to control everything, but you can change your reaction. Add humor to your life by watching funny movies or shows. Make time for activities that you enjoy and that relax  you. Spend less time using electronics, especially at night before bed. The light from screens can make your brain think it is time to get up rather than go to bed.  Medicines Medicines, such as antidepressants, are often a part of treatment for depression. Talk with your pharmacist or health care provider about all the medicines, supplements, and herbal products that you take, their possible side effects, and what medicines and other products are safe to take together. Make sure to report any side effects you may have to your health care provider. Relationships Your health care provider may suggest family therapy, couples therapy, or individual therapy as part of your treatment. How to recognize changes Everyone responds differently to treatment for depression. As you recover from depression, you may start to: Have more interest in doing activities. Feel more hopeful. Have more energy. Eat a more regular amount of food. Have better mental focus. It is important to recognize if your depression is not getting better or is getting worse. The symptoms you had in the beginning may return, such as: Feeling tired. Eating too much or too little. Sleeping too much or too little. Feeling restless, agitated, or hopeless. Trouble focusing or making decisions. Having unexplained aches and pains. Feeling irritable, angry, or aggressive. If you or your family members notice these symptoms coming back, let your health care provider know right away. Follow these instructions at home: Activity Try to get some form of exercise each day, such as walking. Try yoga, mindfulness, or other stress reduction techniques. Participate in group activities if you are able. Lifestyle Get enough sleep. Cut down on or stop using caffeine, tobacco, alcohol, and any other harmful substances. Eat a healthy diet that includes plenty of vegetables, fruits, whole grains, low-fat dairy products, and lean protein. Limit  foods that are high in solid fats, added sugar, or salt (sodium). General instructions Take over-the-counter and prescription medicines only as told by your health care provider. Keep all follow-up visits. It is important for your health care provider to check on your mood, behavior, and medicines. Your health care provider may need to make changes to your treatment. Where to find support Talking to others  Friends and family members can be sources of support and guidance. Talk to trusted friends or family members about your condition. Explain your symptoms and let them know that you are working with a health care provider to treat your depression. Tell friends and family how they can help. Finances Find mental health providers that fit with your financial situation. Talk with your health care provider if you are worried about access to food, housing, or medicine. Call your insurance company to learn about your co-pays and prescription plan. Where to find more information You can find support in your area from: Anxiety and Depression Association of America (ADAA): adaa.org Mental Health America: mentalhealthamerica.net [employer] on Mental Illness: nami.org Contact a health care provider if: You stop taking your antidepressant medicines, and you have any  of these symptoms: Nausea. Headache. Light-headedness. Chills and body aches. Not being able to sleep (insomnia). You or your friends and family think your depression is getting worse. Get help right away if: You have thoughts of hurting yourself or others. Get help right away if you feel like you may hurt yourself or others, or have thoughts about taking your own life. Go to your nearest emergency room or: Call 911. Call the [facility] at (574)670-9295 or 988. This is open 24 hours a day. Text the Crisis Text Line at 939-438-3206. This information is not intended to replace advice given to you by your  health care provider. Make sure you discuss any questions you have with your health care provider. Document Revised: 06/15/2021 Document Reviewed: 06/15/2021 Elsevier Patient Education  [place] Sleep Information, Adult Quality sleep is important for your mental and physical health. It also improves your quality of life. Quality sleep means you: Are asleep for most of the time you are in bed. Fall asleep within 30 minutes. Wake up no more than once a night. Are awake for no longer than 20 minutes if you do wake up during the night. Most adults need 7-8 hours of quality sleep each night. How can poor sleep affect me? If you do not get enough quality sleep, you may have: Mood swings. Daytime sleepiness. Decreased alertness, reaction time, and concentration. Sleep disorders, such as insomnia and sleep apnea. Difficulty with: Solving problems. Coping with stress. Paying attention. These issues may affect your performance and productivity at work, school, and home. Lack of sleep may also put you at higher risk for accidents, suicide, and risky behaviors. If you do not get quality sleep, you may also be at higher risk for several health problems, including: Infections. Type 2 diabetes. Heart disease. High blood pressure. Obesity. Worsening of long-term conditions, like arthritis, kidney disease, depression, Parkinson's disease, and epilepsy. What actions can I take to get more quality sleep? Sleep schedule and routine Stick to a sleep schedule. Go to sleep and wake up at about the same time each day. Do not try to sleep less on weekdays and make up for lost sleep on weekends. This does not work. Limit naps during the day to 30 minutes or less. Do not take naps in the late afternoon. Make time to relax before bed. Reading, listening to music, or taking a hot bath promotes quality sleep. Make your bedroom a place that promotes quality sleep. Keep your bedroom dark,  quiet, and at a comfortable room temperature. Make sure your bed is comfortable. Avoid using electronic devices that give off bright blue light for 30 minutes before bedtime. Your brain perceives bright blue light as sunlight. This includes television, phones, and computers. If you are lying awake in bed for longer than 20 minutes, get up and do a relaxing activity until you feel sleepy. Lifestyle     Try to get at least 30 minutes of exercise on most days. Do not exercise 2-3 hours before going to bed. Do not use any products that contain nicotine or tobacco. These products include cigarettes, chewing tobacco, and vaping devices, such as e-cigarettes. If you need help quitting, ask your health care provider. Do not drink caffeinated beverages for at least 8 hours before going to bed. Coffee, tea, and some sodas contain caffeine. Do not drink alcohol or eat large meals close to bedtime. Try to get at least 30 minutes of sunlight every day. Morning  sunlight is best. Medical concerns Work with your health care provider to treat medical conditions that may affect sleeping, such as: Nasal obstruction. Snoring. Sleep apnea and other sleep disorders. Talk to your health care provider if you think any of your prescription medicines may cause you to have difficulty falling or staying asleep. If you have sleep problems, talk with a sleep consultant. If you think you have a sleep disorder, talk with your health care provider about getting evaluated by a specialist. Where to find more information Sleep Foundation: sleepfoundation.org American Academy of Sleep Medicine: aasm.org Centers for Disease Control and Prevention (CDC): [employer].com.cy Contact a health care provider if: You have trouble getting to sleep or staying asleep. You often wake up very early in the morning and cannot get back to sleep. You have daytime sleepiness. You have daytime sleep attacks of suddenly falling asleep and sudden muscle  weakness (narcolepsy). You have a tingling sensation in your legs with a strong urge to move your legs (restless legs syndrome). You stop breathing briefly during sleep (sleep apnea). You think you have a sleep disorder or are taking a medicine that is affecting your quality of sleep. Summary Most adults need 7-8 hours of quality sleep each night. Getting enough quality sleep is important for your mental and physical health. Make your bedroom a place that promotes quality sleep, and avoid things that may cause you to have poor sleep, such as alcohol, caffeine, smoking, or large meals. Talk to your health care provider if you have trouble falling asleep or staying asleep. This information is not intended to replace advice given to you by your health care provider. Make sure you discuss any questions you have with your health care provider. Document Revised: 06/02/2021 Document Reviewed: 06/02/2021 Elsevier Patient Education  [place].

## 2022-01-19 NOTE — Progress Notes (Signed)
Subjective:  Patient ID: Mindy Young, female    DOB: 02-12-1946  Age: 76 y.o. MRN: [id number]  Chief Complaint  Patient presents with   Anxiety   Depression   GeneSight Results   HPI: Pt presents for follow-up of anxiety with depression and to discuss [facility] results. She has severe alcohol use disorder. Drinks [employer] liquor daily. Reports severe insomnia. She has declined treatment for alcohol dependence. She is not currently in counseling.      Anxiety and Depression, Follow-up:  Discuss GeneSight Results. Current treatment includes Atarax patient states it is not helping   She reports excellent compliance with treatment. She reports excellent tolerance of treatment. She is not having side effects.   She feels her anxiety is severe and Unchanged since last visit.   GAD-7 Results    11/25/2021    9:38 AM  GAD-7 Generalized Anxiety Disorder Screening Tool  1. Feeling Nervous, Anxious, or on Edge 3  2. Not Being Able to Stop or Control Worrying 2  3. Worrying Too Much About Different Things 1  4. Trouble Relaxing 3  5. Being So Restless it's Hard To Sit Still 3  6. Becoming Easily Annoyed or Irritable 2  7. Feeling Afraid As If Something Awful Might Happen 0  Total GAD-7 Score 14  Difficulty At Work, Home, or Getting  Along With Others? Somewhat difficult    PHQ-9 Scores    11/25/2021    9:35 AM 12/27/2020   12:48 PM  PHQ9 SCORE ONLY  PHQ-9 Total Score 19 13   GeneSight; Anti-Depressants (Smoker) Use as directed: Anafranil Norpramin Pristiq Tofranil Fetzima Pamelor Desyrel Viibryd Trintellix  Moderate Gene-drug Interaction: Wellbutrin Emsam Effexor Remeron Elavil Sinequan Paxil Celexa Lexapro Prozac Zoloft  Significant Gene-drug interaction: Cymbalta Luvox   Anxiolytics and Hypnotics Use as Directed: Xanax BuSpar Librium Klonopin Tranxene Lunesta Dayvigo Ativan Serax Belsomra Restoril Ambien  Moderate Gene-drug  interaction: Valium Inderal  Significant Gene-drug interaction:  Current Outpatient Medications on File Prior to Visit  Medication Sig Dispense Refill   acamprosate (CAMPRAL) 333 MG tablet Take 666 mg by mouth 3 (three) times daily.     albuterol (VENTOLIN HFA) 108 (90 Base) MCG/ACT inhaler Inhale 1-2 puffs into the lungs every 4 (four) hours as needed for wheezing or shortness of breath.     busPIRone (BUSPAR) 10 MG tablet Take 1 tablet (10 mg total) by mouth 2 (two) times daily. 60 tablet 1   cyanocobalamin (VITAMIN B12) 1000 MCG tablet Take by mouth.     diphenhydrAMINE (BENADRYL) 25 MG tablet Take 1 tablet by mouth at bedtime.     diphenoxylate-atropine (LOMOTIL) 2.5-0.025 MG tablet Take 1 tablet by mouth 4 (four) times daily as needed for diarrhea or loose stools. 30 tablet 0   fluticasone-salmeterol (ADVAIR) 250-50 MCG/ACT AEPB Inhale 1 puff into the lungs 2 (two) times daily as needed (shortness of breath/wheezing).     folic acid (FOLVITE) 1 MG tablet Take 1 tablet (1 mg total) by mouth daily. 30 tablet 0   gabapentin (NEURONTIN) 300 MG capsule Take 1 capsule (300 mg total) by mouth 3 (three) times daily. 300 mg every 6 hours on day 1, then 300 mg every 8 hours on day 2, then 300 mg every 12 hours on day 3, then 300 mg at night on day 4. In addition to scheduled doses, provide one additional as-needed 300 mg dose per day for breakthrough withdrawal symptom 30 capsule 1   hydrOXYzine (ATARAX) 25 MG tablet  Take 1 tablet (25 mg total) by mouth 3 (three) times daily. 90 tablet 1   INCRUSE ELLIPTA 62.5 MCG/ACT AEPB Inhale 1 puff into the lungs daily.     levETIRAcetam (KEPPRA) 1000 MG tablet Take 1 tablet (1,000 mg total) by mouth 2 (two) times daily. 60 tablet 11   losartan (COZAAR) 100 MG tablet Take 1 tablet (100 mg total) by mouth daily.     magnesium oxide (MAG-OX) 400 (240 Mg) MG tablet Take 2 tablets (800 mg total) by mouth 2 (two) times daily.     Melatonin 10 MG TABS Take by mouth.      metoprolol succinate (TOPROL-XL) 25 MG 24 hr tablet Take 25 mg by mouth daily.     ondansetron (ZOFRAN-ODT) 4 MG disintegrating tablet Take 1 tablet (4 mg total) by mouth every 8 (eight) hours as needed for nausea or vomiting. 20 tablet 0   oxybutynin (DITROPAN-XL) 10 MG 24 hr tablet Take 1 tablet by mouth daily.     thiamine 100 MG tablet Take 1 tablet (100 mg total) by mouth daily. 30 tablet 0   traZODone (DESYREL) 50 MG tablet Take 50 mg by mouth at bedtime.     No current facility-administered medications on file prior to visit.   Past Medical History:  Diagnosis Date   Alcohol abuse    Allergy    Anemia 06/29/2011   Anxiety    Arthritis    Breast cancer ([place])    right breast   C1 cervical fracture ([place]) 02/1997   Cancer ([place])    melanoma   Depression    Family history of breast cancer    Family history of prostate cancer    GERD (gastroesophageal reflux disease)    Hypertension    Tardive dyskinesia    Past Surgical History:  Procedure Laterality Date   ABDOMINAL HYSTERECTOMY     ASPIRATION OF ABSCESS Right 11/20/2017   Procedure: ASPIRATION OF RIGHT AXILLARY SEROMA;  Surgeon: Rolm Bookbinder, MD;  Location: [place];  Service: General;  Laterality: Right;   AUGMENTATION MAMMAPLASTY Bilateral    biateral implants , approx 2015   BREAST LUMPECTOMY Right 11/02/2017   re-ex 11-20-17   BREAST LUMPECTOMY WITH RADIOACTIVE SEED AND SENTINEL LYMPH NODE BIOPSY Right 11/02/2017   Procedure: BREAST LUMPECTOMY WITH RADIOACTIVE SEED AND SENTINEL LYMPH NODE BIOPSY;  Surgeon: Rolm Bookbinder, MD;  Location: [place];  Service: General;  Laterality: Right;   BRONCHIAL WASHINGS  02/21/2021   Procedure: BRONCHIAL WASHINGS;  Surgeon: Candee Furbish, MD;  Location: [facility] ENDOSCOPY;  Service: Pulmonary;;   CHOLECYSTECTOMY     collarbone     INCONTINENCE SURGERY     KNEE SURGERY     removal of cyst, repair of cartiledge   LAPAROSCOPY     for  endometriosis   RE-EXCISION OF BREAST LUMPECTOMY Right 11/20/2017   Procedure: RE-EXCISION OF RIGHT BREAST MARGINS;  Surgeon: Rolm Bookbinder, MD;  Location: [place];  Service: General;  Laterality: Right;   ROTATOR CUFF REPAIR     left   VIDEO BRONCHOSCOPY Right 02/21/2021   Procedure: VIDEO BRONCHOSCOPY WITHOUT FLUORO;  Surgeon: Candee Furbish, MD;  Location: [facility] ENDOSCOPY;  Service: Pulmonary;  Laterality: Right;    Family History  Problem Relation Age of Onset   COPD Mother    Prostate cancer Father 37       seed implant for treatment   Colon polyps Father        '  a few'   Breast cancer Sister 65   Breast cancer Maternal Aunt        dx >50   Breast cancer Other 44       bilateral   Colon cancer Neg Hx    Social History   Socioeconomic History   Marital status: Married    Spouse name: Not on file   Number of children: 2   Years of education: Not on file   Highest education level: Not on file  Occupational History   Not on file  Tobacco Use   Smoking status: Every Day    Packs/day: 2.00    Types: Cigarettes   Smokeless tobacco: Never   Tobacco comments:    e-cigs  Vaping Use   Vaping Use: Former  Substance and Sexual Activity   Alcohol use: Yes    Alcohol/week: 5.0 standard drinks of alcohol    Types: 5 Standard drinks or equivalent per week    Comment: per day.   Drug use: No   Sexual activity: Not Currently  Other Topics Concern   Not on file  Social History Narrative   Right Handed    Lives in a one story home   Social Determinants of Health   Financial Resource Strain: Low Risk  (11/25/2021)   Overall Financial Resource Strain (CARDIA)    Difficulty of Paying Living Expenses: Not hard at all  Food Insecurity: No Food Insecurity (11/25/2021)   Hunger Vital Sign    Worried About Running Out of Food in the Last Year: Never true    Ran Out of Food in the Last Year: Never true  Transportation Needs: No Transportation Needs (11/25/2021)    PRAPARE - Hydrologist (Medical): No    Lack of Transportation (Non-Medical): No  Physical Activity: Inactive (11/25/2021)   Exercise Vital Sign    Days of Exercise per Week: 0 days    Minutes of Exercise per Session: 0 min  Stress: No Stress Concern Present (11/25/2021)   [place]    Feeling of Stress : Not at all  Social Connections: Socially Isolated (11/25/2021)   Social Connection and Isolation Panel [NHANES]    Frequency of Communication with Friends and Family: More than three times a week    Frequency of Social Gatherings with Friends and Family: More than three times a week    Attends Religious Services: Never    Marine scientist or Organizations: No    Attends Archivist Meetings: Never    Marital Status: Divorced    Review of Systems  Constitutional:  Negative for chills, fatigue and fever.  HENT:  Negative for congestion, ear pain, rhinorrhea and sore throat.   Respiratory:  Negative for cough and shortness of breath.   Cardiovascular:  Negative for chest pain.  Gastrointestinal:  Negative for abdominal pain, constipation, diarrhea, nausea and vomiting.  Genitourinary:  Negative for dysuria and urgency.  Musculoskeletal:  Negative for back pain and myalgias.  Neurological:  Negative for dizziness, weakness, light-headedness and headaches.  Psychiatric/Behavioral:  Negative for dysphoric mood. The patient is not nervous/anxious.      Objective:  BP 134/80   Pulse (!) 101   Temp (!) 97.2 F (36.2 C)   Ht '5\' 2"'$  (1.575 m)   Wt 128 lb (58.1 kg)   SpO2 96%   BMI 23.41 kg/m      01/19/2022   11:22 AM 11/25/2021  9:33 AM 11/18/2021    2:25 PM  BP/Weight  Systolic BP 419 622 297  Diastolic BP 80 72 80  Wt. (Lbs) 128 123 123  BMI 23.41 kg/m2 22.5 kg/m2 22.5 kg/m2    Physical Exam  Diabetic Foot Exam - Simple   No data filed      Lab Results   Component Value Date   WBC 12.1 (H) 11/18/2021   HGB 14.1 11/18/2021   HCT 42.1 11/18/2021   PLT 376 11/18/2021   GLUCOSE 108 (H) 11/18/2021   CHOL 247 (H) 11/18/2021   TRIG 240 (H) 11/18/2021   HDL 90 11/18/2021   LDLCALC 116 (H) 11/18/2021   ALT 49 (H) 11/18/2021   AST 57 (H) 11/18/2021   NA 138 11/18/2021   K 4.3 11/18/2021   CL 94 (L) 11/18/2021   CREATININE 1.13 (H) 11/18/2021   BUN 14 11/18/2021   CO2 25 11/18/2021   TSH 0.514 11/18/2021   INR 0.9 02/19/2021   HGBA1C 6.0 (H) 12/10/2018      Assessment & Plan:   1. 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  Dispense: 14 tablet; Refill: 0 - busPIRone (BUSPAR) 15 MG tablet; Take 1 tablet (15 mg total) by mouth 2 (two) times daily.  Dispense: 60 tablet; Refill: 0  2. Alcohol use disorder, severe, dependence (HCC)-not at goal - Ambulatory referral to Psychiatry - gabapentin (NEURONTIN) 300 MG capsule; Take 1 capsule (300 mg total) by mouth 3 (three) times daily.  Dispense: 90 capsule; Refill: 3  3. Alcohol-induced insomnia (HCC) - traZODone (DESYREL) 50 MG tablet; Take 0.5-1 tablets (25-50 mg total) by mouth at bedtime as needed for sleep.  Dispense: 30 tablet; Refill: 3   Increase Buspar to 15 mg twice daily for anxiety Resume Gabapentin 300 mg three times daily Begin Trintellix 5 mg once daily for depression Stop medication immediately for any severe side effects Monitor of Serotonin Syndrome We will call you with psychiatry referral appointment Continue to work on decreasing alcohol use Recommend alcohol rehabilitation Follow-up in 2-weeks    Follow-up: 2-weeks  An After Visit Summary was printed and given to the patient.  I, Rip Harbour, NP, have reviewed all documentation for this visit. The documentation on 01/19/22 for the exam, diagnosis, procedures, and orders are all  accurate and complete.   Signed, Rip Harbour, NP [place] [phone]

## 2022-02-03 ENCOUNTER — Ambulatory Visit: Payer: Medicare Other | Admitting: Nurse Practitioner

## 2022-02-04 ENCOUNTER — Ambulatory Visit: Payer: Medicare Other | Admitting: Nurse Practitioner

## 2022-02-16 ENCOUNTER — Other Ambulatory Visit: Payer: Self-pay | Admitting: Nurse Practitioner

## 2022-02-16 DIAGNOSIS — F418 Other specified anxiety disorders: Secondary | ICD-10-CM

## 2022-03-29 ENCOUNTER — Telehealth: Payer: Self-pay

## 2022-03-29 NOTE — Telephone Encounter (Signed)
Patient was scheduled for 3:00 AWV Telephone Visit today.  Patient did not answer.

## 2022-04-13 ENCOUNTER — Ambulatory Visit: Payer: Medicare Other | Admitting: Nurse Practitioner

## 2022-04-13 ENCOUNTER — Encounter: Payer: Self-pay | Admitting: Family Medicine

## 2022-04-13 NOTE — Progress Notes (Deleted)
Subjective:  Patient ID: Mindy Young, female    DOB: 07/20/1945  Age: 77 y.o. MRN: [id number]  No chief complaint on file.    History of Present illness:   Current Outpatient Medications on File Prior to Visit  Medication Sig Dispense Refill   albuterol (VENTOLIN HFA) 108 (90 Base) MCG/ACT inhaler Inhale 1-2 puffs into the lungs every 4 (four) hours as needed for wheezing or shortness of breath.     busPIRone (BUSPAR) 15 MG tablet TAKE 1 TABLET(15 MG) BY MOUTH TWICE DAILY 60 tablet 0   cyanocobalamin (VITAMIN B12) 1000 MCG tablet Take by mouth.     diphenhydrAMINE (BENADRYL) 25 MG tablet Take 1 tablet by mouth at bedtime.     diphenoxylate-atropine (LOMOTIL) 2.5-0.025 MG tablet Take 1 tablet by mouth 4 (four) times daily as needed for diarrhea or loose stools. 30 tablet 0   fluticasone-salmeterol (ADVAIR) 250-50 MCG/ACT AEPB Inhale 1 puff into the lungs 2 (two) times daily as needed (shortness of breath/wheezing).     folic acid (FOLVITE) 1 MG tablet Take 1 tablet (1 mg total) by mouth daily. 30 tablet 0   gabapentin (NEURONTIN) 300 MG capsule Take 1 capsule (300 mg total) by mouth 3 (three) times daily. 90 capsule 3   hydrOXYzine (ATARAX) 25 MG tablet Take 1 tablet (25 mg total) by mouth 3 (three) times daily. 90 tablet 1   INCRUSE ELLIPTA 62.5 MCG/ACT AEPB Inhale 1 puff into the lungs daily.     levETIRAcetam (KEPPRA) 1000 MG tablet Take 1 tablet (1,000 mg total) by mouth 2 (two) times daily. 60 tablet 11   losartan (COZAAR) 100 MG tablet Take 1 tablet (100 mg total) by mouth daily.     magnesium oxide (MAG-OX) 400 (240 Mg) MG tablet Take 2 tablets (800 mg total) by mouth 2 (two) times daily.     Melatonin 10 MG TABS Take by mouth.     metoprolol succinate (TOPROL-XL) 25 MG 24 hr tablet Take 25 mg by mouth daily.     ondansetron (ZOFRAN-ODT) 4 MG disintegrating tablet Take 1 tablet (4 mg total) by mouth every 8 (eight) hours as needed for nausea or vomiting. 20 tablet 0   oxybutynin  (DITROPAN-XL) 10 MG 24 hr tablet Take 1 tablet by mouth daily.     thiamine 100 MG tablet Take 1 tablet (100 mg total) by mouth daily. 30 tablet 0   traZODone (DESYREL) 50 MG tablet Take 0.5-1 tablets (25-50 mg total) by mouth at bedtime as needed for sleep. 30 tablet 3   vortioxetine HBr (TRINTELLIX) 5 MG TABS tablet Take 1 tablet (5 mg total) by mouth daily. 14 tablet 0   No current facility-administered medications on file prior to visit.   Past Medical History:  Diagnosis Date   Alcohol abuse    Allergy    Anemia 06/29/2011   Anxiety    Arthritis    Breast cancer ([place])    right breast   C1 cervical fracture ([place]) 02/1997   Cancer ([place])    melanoma   Depression    Family history of breast cancer    Family history of prostate cancer    GERD (gastroesophageal reflux disease)    Hypertension    Tardive dyskinesia    Past Surgical History:  Procedure Laterality Date   ABDOMINAL HYSTERECTOMY     ASPIRATION OF ABSCESS Right 11/20/2017   Procedure: ASPIRATION OF RIGHT AXILLARY SEROMA;  Surgeon: Rolm Bookbinder, MD;  Location: [place];  Service: General;  Laterality: Right;   AUGMENTATION MAMMAPLASTY Bilateral    biateral implants , approx 2015   BREAST LUMPECTOMY Right 11/02/2017   re-ex 11-20-17   BREAST LUMPECTOMY WITH RADIOACTIVE SEED AND SENTINEL LYMPH NODE BIOPSY Right 11/02/2017   Procedure: BREAST LUMPECTOMY WITH RADIOACTIVE SEED AND SENTINEL LYMPH NODE BIOPSY;  Surgeon: Rolm Bookbinder, MD;  Location: [place];  Service: General;  Laterality: Right;   BRONCHIAL WASHINGS  02/21/2021   Procedure: BRONCHIAL WASHINGS;  Surgeon: Candee Furbish, MD;  Location: [facility] ENDOSCOPY;  Service: Pulmonary;;   CHOLECYSTECTOMY     collarbone     INCONTINENCE SURGERY     KNEE SURGERY     removal of cyst, repair of cartiledge   LAPAROSCOPY     for endometriosis   RE-EXCISION OF BREAST LUMPECTOMY Right 11/20/2017   Procedure: RE-EXCISION OF RIGHT BREAST  MARGINS;  Surgeon: Rolm Bookbinder, MD;  Location: [place];  Service: General;  Laterality: Right;   ROTATOR CUFF REPAIR     left   VIDEO BRONCHOSCOPY Right 02/21/2021   Procedure: VIDEO BRONCHOSCOPY WITHOUT FLUORO;  Surgeon: Candee Furbish, MD;  [facility];  Service: Pulmonary;  Laterality: Right;    Family History  Problem Relation Age of Onset   COPD Mother    Prostate cancer Father 45       seed implant for treatment   Colon polyps Father        'a few'   Breast cancer Sister 56   Breast cancer Maternal Aunt        dx >50   Breast cancer Other 49       bilateral   Colon cancer Neg Hx    Social History   Socioeconomic History   Marital status: Married    Spouse name: Not on file   Number of children: 2   Years of education: Not on file   Highest education level: Not on file  Occupational History   Not on file  Tobacco Use   Smoking status: Every Day    Packs/day: 2.00    Types: Cigarettes   Smokeless tobacco: Never   Tobacco comments:    e-cigs  Vaping Use   Vaping Use: Former  Substance and Sexual Activity   Alcohol use: Yes    Alcohol/week: 5.0 standard drinks of alcohol    Types: 5 Standard drinks or equivalent per week    Comment: per day.   Drug use: No   Sexual activity: Not Currently  Other Topics Concern   Not on file  Social History Narrative   Right Handed    Lives in a one story home   Social Determinants of Health   Financial Resource Strain: Low Risk  (11/25/2021)   Overall Financial Resource Strain (CARDIA)    Difficulty of Paying Living Expenses: Not hard at all  Food Insecurity: No Food Insecurity (11/25/2021)   Hunger Vital Sign    Worried About Running Out of Food in the Last Year: Never true    Ran Out of Food in the Last Year: Never true  Transportation Needs: No Transportation Needs (11/25/2021)   PRAPARE - Hydrologist (Medical): No    Lack of Transportation (Non-Medical):  No  Physical Activity: Inactive (11/25/2021)   Exercise Vital Sign    Days of Exercise per Week: 0 days    Minutes of Exercise per Session: 0 min  Stress: No Stress Concern Present (11/25/2021)  [place] Questionnaire    Feeling of Stress : Not at all  Social Connections: Socially Isolated (11/25/2021)   Social Connection and Isolation Panel [NHANES]    Frequency of Communication with Friends and Family: More than three times a week    Frequency of Social Gatherings with Friends and Family: More than three times a week    Attends Religious Services: Never    Marine scientist or Organizations: No    Attends Archivist Meetings: Never    Marital Status: Divorced    Review of Systems   Objective:  There were no vitals taken for this visit.     01/19/2022   11:22 AM 11/25/2021    9:33 AM 11/18/2021    2:25 PM  BP/Weight  Systolic BP [id number]  Diastolic BP 80 72 80  Wt. (Lbs) 128 123 123  BMI 23.41 kg/m2 22.5 kg/m2 22.5 kg/m2    Physical Exam  Diabetic Foot Exam - Simple   No data filed        11/25/2021    9:35 AM 12/27/2020   12:48 PM  Depression screen PHQ 2/9  Decreased Interest 3 1  Down, Depressed, Hopeless 1 3  PHQ - 2 Score 4 4  Altered sleeping 3 3  Tired, decreased energy 3 2  Change in appetite 3 0  Feeling bad or failure about yourself  3 3  Trouble concentrating 0 1  Moving slowly or fidgety/restless 3 0  Suicidal thoughts 0 0  PHQ-9 Score 19 13  Difficult doing work/chores Not difficult at all Somewhat difficult       04/26/2021   10:00 AM 04/26/2021    8:00 PM 04/27/2021   10:00 AM 07/30/2021    3:40 PM 08/05/2021    8:46 PM  Fall Risk  (RETIRED) Patient Fall Risk Level High fall risk High fall risk High fall risk High fall risk High fall risk    Lab Results  Component Value Date   WBC 12.1 (H) 11/18/2021   HGB 14.1 11/18/2021   HCT 42.1 11/18/2021   PLT 376 11/18/2021    GLUCOSE 108 (H) 11/18/2021   CHOL 247 (H) 11/18/2021   TRIG 240 (H) 11/18/2021   HDL 90 11/18/2021   LDLCALC 116 (H) 11/18/2021   ALT 49 (H) 11/18/2021   AST 57 (H) 11/18/2021   NA 138 11/18/2021   K 4.3 11/18/2021   CL 94 (L) 11/18/2021   CREATININE 1.13 (H) 11/18/2021   BUN 14 11/18/2021   CO2 25 11/18/2021   TSH 0.514 11/18/2021   INR 0.9 02/19/2021   HGBA1C 6.0 (H) 12/10/2018      Assessment & Plan:   There are no diagnoses linked to this encounter.   Follow-up: No follow-ups on file.  An After Visit Summary was printed and given to the patient.  I, Neil Crouch have reviewed all documentation for this visit. The documentation on 04/13/22   for the exam, diagnosis, procedures, and orders are all accurate and complete.    Neil Crouch, DNP, [facility] [phone]

## 2023-12-20 ENCOUNTER — Other Ambulatory Visit: Payer: Self-pay | Admitting: Family Medicine

## 2023-12-20 DIAGNOSIS — R4182 Altered mental status, unspecified: Secondary | ICD-10-CM

## 2024-01-01 ENCOUNTER — Other Ambulatory Visit: Payer: Self-pay | Admitting: Family Medicine

## 2024-01-01 DIAGNOSIS — K7400 Hepatic fibrosis, unspecified: Secondary | ICD-10-CM
